# Patient Record
Sex: Female | Born: 1940
Health system: Southern US, Community
[De-identification: ages and names within clinical notes are randomized; demographics above are authoritative.]

## PROBLEM LIST (undated history)

## (undated) DIAGNOSIS — J45909 Unspecified asthma, uncomplicated: Secondary | ICD-10-CM

## (undated) DIAGNOSIS — I517 Cardiomegaly: Secondary | ICD-10-CM

## (undated) DIAGNOSIS — R269 Unspecified abnormalities of gait and mobility: Secondary | ICD-10-CM

## (undated) DIAGNOSIS — R748 Abnormal levels of other serum enzymes: Secondary | ICD-10-CM

## (undated) DIAGNOSIS — E669 Obesity, unspecified: Secondary | ICD-10-CM

## (undated) DIAGNOSIS — R0602 Shortness of breath: Secondary | ICD-10-CM

## (undated) DIAGNOSIS — R42 Dizziness and giddiness: Secondary | ICD-10-CM

## (undated) DIAGNOSIS — E039 Hypothyroidism, unspecified: Secondary | ICD-10-CM

## (undated) DIAGNOSIS — R945 Abnormal results of liver function studies: Secondary | ICD-10-CM

## (undated) DIAGNOSIS — K439 Ventral hernia without obstruction or gangrene: Secondary | ICD-10-CM

## (undated) DIAGNOSIS — I10 Essential (primary) hypertension: Secondary | ICD-10-CM

## (undated) DIAGNOSIS — E559 Vitamin D deficiency, unspecified: Secondary | ICD-10-CM

## (undated) DIAGNOSIS — G3184 Mild cognitive impairment, so stated: Secondary | ICD-10-CM

## (undated) DIAGNOSIS — T8859XA Other complications of anesthesia, initial encounter: Secondary | ICD-10-CM

## (undated) DIAGNOSIS — K224 Dyskinesia of esophagus: Secondary | ICD-10-CM

## (undated) DIAGNOSIS — R002 Palpitations: Secondary | ICD-10-CM

## (undated) DIAGNOSIS — M109 Gout, unspecified: Secondary | ICD-10-CM

## (undated) DIAGNOSIS — F3289 Other specified depressive episodes: Secondary | ICD-10-CM

## (undated) DIAGNOSIS — K2100 Gastro-esophageal reflux disease with esophagitis, without bleeding: Secondary | ICD-10-CM

## (undated) DIAGNOSIS — N393 Stress incontinence (female) (male): Secondary | ICD-10-CM

## (undated) DIAGNOSIS — R935 Abnormal findings on diagnostic imaging of other abdominal regions, including retroperitoneum: Secondary | ICD-10-CM

## (undated) DIAGNOSIS — IMO0002 Reserved for concepts with insufficient information to code with codable children: Secondary | ICD-10-CM

## (undated) DIAGNOSIS — R0989 Other specified symptoms and signs involving the circulatory and respiratory systems: Secondary | ICD-10-CM

## (undated) DIAGNOSIS — J441 Chronic obstructive pulmonary disease with (acute) exacerbation: Secondary | ICD-10-CM

## (undated) DIAGNOSIS — E785 Hyperlipidemia, unspecified: Secondary | ICD-10-CM

## (undated) DIAGNOSIS — E11319 Type 2 diabetes mellitus with unspecified diabetic retinopathy without macular edema: Secondary | ICD-10-CM

## (undated) DIAGNOSIS — L738 Other specified follicular disorders: Secondary | ICD-10-CM

## (undated) DIAGNOSIS — M25519 Pain in unspecified shoulder: Secondary | ICD-10-CM

## (undated) DIAGNOSIS — R0681 Apnea, not elsewhere classified: Secondary | ICD-10-CM

## (undated) DIAGNOSIS — T4145XA Adverse effect of unspecified anesthetic, initial encounter: Secondary | ICD-10-CM

## (undated) DIAGNOSIS — G44209 Tension-type headache, unspecified, not intractable: Secondary | ICD-10-CM

## (undated) DIAGNOSIS — R413 Other amnesia: Secondary | ICD-10-CM

## (undated) DIAGNOSIS — M545 Low back pain, unspecified: Secondary | ICD-10-CM

## (undated) DIAGNOSIS — G43011 Migraine without aura, intractable, with status migrainosus: Secondary | ICD-10-CM

## (undated) DIAGNOSIS — R079 Chest pain, unspecified: Secondary | ICD-10-CM

## (undated) DIAGNOSIS — M25559 Pain in unspecified hip: Secondary | ICD-10-CM

## (undated) DIAGNOSIS — E109 Type 1 diabetes mellitus without complications: Secondary | ICD-10-CM

## (undated) DIAGNOSIS — M25579 Pain in unspecified ankle and joints of unspecified foot: Secondary | ICD-10-CM

## (undated) DIAGNOSIS — R109 Unspecified abdominal pain: Secondary | ICD-10-CM

## (undated) DIAGNOSIS — I498 Other specified cardiac arrhythmias: Secondary | ICD-10-CM

## (undated) DIAGNOSIS — E1065 Type 1 diabetes mellitus with hyperglycemia: Secondary | ICD-10-CM

## (undated) DIAGNOSIS — R609 Edema, unspecified: Secondary | ICD-10-CM

## (undated) DIAGNOSIS — D1739 Benign lipomatous neoplasm of skin and subcutaneous tissue of other sites: Secondary | ICD-10-CM

## (undated) DIAGNOSIS — E538 Deficiency of other specified B group vitamins: Secondary | ICD-10-CM

## (undated) DIAGNOSIS — F329 Major depressive disorder, single episode, unspecified: Secondary | ICD-10-CM

## (undated) DIAGNOSIS — E7800 Pure hypercholesterolemia, unspecified: Secondary | ICD-10-CM

## (undated) HISTORY — DX: Pain in unspecified ankle and joints of unspecified foot: M25.579

## (undated) HISTORY — DX: Other specified cardiac arrhythmias: I49.8

## (undated) HISTORY — PX: BREAST SURGERY: SHX581

## (undated) HISTORY — DX: Vitamin D deficiency, unspecified: E55.9

## (undated) HISTORY — DX: Shortness of breath: R06.02

## (undated) HISTORY — PX: OTHER SURGICAL HISTORY: SHX169

## (undated) HISTORY — DX: Unspecified abnormalities of gait and mobility: R26.9

## (undated) HISTORY — DX: Cardiomegaly: I51.7

## (undated) HISTORY — DX: Chest pain, unspecified: R07.9

## (undated) HISTORY — DX: Essential (primary) hypertension: I10

## (undated) HISTORY — DX: Reserved for concepts with insufficient information to code with codable children: IMO0002

## (undated) HISTORY — DX: Chronic obstructive pulmonary disease with (acute) exacerbation: J44.1

## (undated) HISTORY — DX: Type 1 diabetes mellitus without complications: E10.9

## (undated) HISTORY — DX: Deficiency of other specified B group vitamins: E53.8

## (undated) HISTORY — DX: Dyskinesia of esophagus: K22.4

## (undated) HISTORY — PX: ABDOMINAL HYSTERECTOMY: SHX81

## (undated) HISTORY — DX: Other specified symptoms and signs involving the circulatory and respiratory systems: R09.89

## (undated) HISTORY — DX: Unspecified asthma, uncomplicated: J45.909

## (undated) HISTORY — DX: Other specified depressive episodes: F32.89

## (undated) HISTORY — DX: Major depressive disorder, single episode, unspecified: F32.9

## (undated) HISTORY — DX: Migraine without aura, intractable, with status migrainosus: G43.011

## (undated) HISTORY — DX: Abnormal levels of other serum enzymes: R74.8

## (undated) HISTORY — DX: Gout, unspecified: M10.9

## (undated) HISTORY — DX: Hyperlipidemia, unspecified: E78.5

## (undated) HISTORY — DX: Obesity, unspecified: E66.9

## (undated) HISTORY — DX: Other specified follicular disorders: L73.8

## (undated) HISTORY — DX: Unspecified abdominal pain: R10.9

## (undated) HISTORY — DX: Abnormal findings on diagnostic imaging of other abdominal regions, including retroperitoneum: R93.5

## (undated) HISTORY — DX: Pain in unspecified shoulder: M25.519

## (undated) HISTORY — DX: Low back pain: M54.5

## (undated) HISTORY — DX: Type 1 diabetes mellitus with hyperglycemia: E10.65

## (undated) HISTORY — DX: Gastro-esophageal reflux disease with esophagitis, without bleeding: K21.00

## (undated) HISTORY — DX: Other amnesia: R41.3

## (undated) HISTORY — DX: Dizziness and giddiness: R42

## (undated) HISTORY — DX: Mild cognitive impairment of uncertain or unknown etiology: G31.84

## (undated) HISTORY — DX: Benign lipomatous neoplasm of skin and subcutaneous tissue of other sites: D17.39

## (undated) HISTORY — DX: Hypothyroidism, unspecified: E03.9

## (undated) HISTORY — PX: APPENDECTOMY: SHX54

## (undated) HISTORY — DX: Gastro-esophageal reflux disease with esophagitis: K21.0

## (undated) HISTORY — DX: Low back pain, unspecified: M54.50

## (undated) HISTORY — DX: Pain in unspecified hip: M25.559

## (undated) HISTORY — DX: Ventral hernia without obstruction or gangrene: K43.9

## (undated) HISTORY — DX: Apnea, not elsewhere classified: R06.81

## (undated) HISTORY — DX: Abnormal results of liver function studies: R94.5

## (undated) HISTORY — DX: Palpitations: R00.2

## (undated) HISTORY — DX: Type 2 diabetes mellitus with unspecified diabetic retinopathy without macular edema: E11.319

## (undated) HISTORY — DX: Tension-type headache, unspecified, not intractable: G44.209

## (undated) HISTORY — DX: Edema, unspecified: R60.9

## (undated) HISTORY — DX: Stress incontinence (female) (male): N39.3

---

## 1998-04-02 ENCOUNTER — Ambulatory Visit: Admission: RE | Admit: 1998-04-02 | Discharge: 1998-04-02 | Payer: Self-pay | Admitting: *Deleted

## 1998-04-30 ENCOUNTER — Ambulatory Visit: Admission: RE | Admit: 1998-04-30 | Discharge: 1998-04-30 | Payer: Self-pay | Admitting: *Deleted

## 1999-03-12 ENCOUNTER — Encounter: Admission: RE | Admit: 1999-03-12 | Discharge: 1999-03-12 | Payer: Self-pay | Admitting: Family Medicine

## 1999-03-28 ENCOUNTER — Encounter: Admission: RE | Admit: 1999-03-28 | Discharge: 1999-03-28 | Payer: Self-pay | Admitting: Obstetrics and Gynecology

## 1999-03-28 ENCOUNTER — Encounter: Payer: Self-pay | Admitting: Obstetrics and Gynecology

## 2000-02-11 DIAGNOSIS — I517 Cardiomegaly: Secondary | ICD-10-CM | POA: Insufficient documentation

## 2000-05-06 DIAGNOSIS — K649 Unspecified hemorrhoids: Secondary | ICD-10-CM | POA: Insufficient documentation

## 2000-05-22 ENCOUNTER — Encounter: Payer: Self-pay | Admitting: Emergency Medicine

## 2000-05-22 ENCOUNTER — Encounter: Payer: Self-pay | Admitting: Orthopedic Surgery

## 2000-05-22 ENCOUNTER — Inpatient Hospital Stay (HOSPITAL_COMMUNITY): Admission: EM | Admit: 2000-05-22 | Discharge: 2000-05-26 | Payer: Self-pay | Admitting: Emergency Medicine

## 2000-05-23 ENCOUNTER — Encounter: Payer: Self-pay | Admitting: Orthopedic Surgery

## 2000-05-24 ENCOUNTER — Encounter (HOSPITAL_BASED_OUTPATIENT_CLINIC_OR_DEPARTMENT_OTHER): Payer: Self-pay | Admitting: Internal Medicine

## 2000-05-26 ENCOUNTER — Encounter: Payer: Self-pay | Admitting: Orthopedic Surgery

## 2000-12-08 ENCOUNTER — Encounter: Payer: Self-pay | Admitting: Endocrinology

## 2000-12-08 ENCOUNTER — Encounter: Admission: RE | Admit: 2000-12-08 | Discharge: 2000-12-08 | Payer: Self-pay | Admitting: Endocrinology

## 2001-08-09 ENCOUNTER — Emergency Department (HOSPITAL_COMMUNITY): Admission: EM | Admit: 2001-08-09 | Discharge: 2001-08-09 | Payer: Self-pay | Admitting: Emergency Medicine

## 2001-08-09 ENCOUNTER — Encounter: Payer: Self-pay | Admitting: Emergency Medicine

## 2001-09-12 ENCOUNTER — Ambulatory Visit (HOSPITAL_COMMUNITY): Admission: RE | Admit: 2001-09-12 | Discharge: 2001-09-12 | Payer: Self-pay | Admitting: Gastroenterology

## 2001-09-12 LAB — HM COLONOSCOPY

## 2001-11-22 ENCOUNTER — Other Ambulatory Visit: Admission: RE | Admit: 2001-11-22 | Discharge: 2001-11-22 | Payer: Self-pay | Admitting: Gynecology

## 2002-01-17 ENCOUNTER — Encounter: Admission: RE | Admit: 2002-01-17 | Discharge: 2002-01-17 | Payer: Self-pay | Admitting: Gynecology

## 2002-01-17 ENCOUNTER — Encounter: Payer: Self-pay | Admitting: Gynecology

## 2002-06-05 ENCOUNTER — Encounter (INDEPENDENT_AMBULATORY_CARE_PROVIDER_SITE_OTHER): Payer: Self-pay | Admitting: Cardiology

## 2002-06-05 ENCOUNTER — Ambulatory Visit (HOSPITAL_COMMUNITY): Admission: RE | Admit: 2002-06-05 | Discharge: 2002-06-05 | Payer: Self-pay | Admitting: Cardiology

## 2002-09-27 ENCOUNTER — Emergency Department (HOSPITAL_COMMUNITY): Admission: EM | Admit: 2002-09-27 | Discharge: 2002-09-27 | Payer: Self-pay | Admitting: Emergency Medicine

## 2002-09-27 ENCOUNTER — Encounter: Payer: Self-pay | Admitting: Emergency Medicine

## 2002-10-12 ENCOUNTER — Encounter: Payer: Self-pay | Admitting: Pulmonary Disease

## 2002-10-12 ENCOUNTER — Ambulatory Visit (HOSPITAL_COMMUNITY): Admission: RE | Admit: 2002-10-12 | Discharge: 2002-10-12 | Payer: Self-pay | Admitting: Pulmonary Disease

## 2003-03-30 ENCOUNTER — Encounter: Admission: RE | Admit: 2003-03-30 | Discharge: 2003-03-30 | Payer: Self-pay | Admitting: Gynecology

## 2003-10-21 ENCOUNTER — Inpatient Hospital Stay (HOSPITAL_COMMUNITY): Admission: EM | Admit: 2003-10-21 | Discharge: 2003-10-30 | Payer: Self-pay | Admitting: Emergency Medicine

## 2004-01-23 ENCOUNTER — Other Ambulatory Visit: Admission: RE | Admit: 2004-01-23 | Discharge: 2004-01-23 | Payer: Self-pay | Admitting: Gynecology

## 2004-01-30 ENCOUNTER — Encounter: Admission: RE | Admit: 2004-01-30 | Discharge: 2004-01-30 | Payer: Self-pay | Admitting: Otolaryngology

## 2004-03-31 ENCOUNTER — Encounter: Admission: RE | Admit: 2004-03-31 | Discharge: 2004-03-31 | Payer: Self-pay | Admitting: Gynecology

## 2004-04-14 DIAGNOSIS — J441 Chronic obstructive pulmonary disease with (acute) exacerbation: Secondary | ICD-10-CM | POA: Insufficient documentation

## 2004-05-29 ENCOUNTER — Encounter: Admission: RE | Admit: 2004-05-29 | Discharge: 2004-05-29 | Payer: Self-pay | Admitting: *Deleted

## 2004-07-08 ENCOUNTER — Encounter: Admission: RE | Admit: 2004-07-08 | Discharge: 2004-10-06 | Payer: Self-pay | Admitting: *Deleted

## 2004-07-22 ENCOUNTER — Inpatient Hospital Stay (HOSPITAL_COMMUNITY): Admission: RE | Admit: 2004-07-22 | Discharge: 2004-08-01 | Payer: Self-pay | Admitting: *Deleted

## 2004-07-22 HISTORY — PX: LAPAROSCOPIC GASTRIC BYPASS: SUR771

## 2004-07-25 ENCOUNTER — Ambulatory Visit: Payer: Self-pay | Admitting: *Deleted

## 2004-07-25 ENCOUNTER — Encounter (INDEPENDENT_AMBULATORY_CARE_PROVIDER_SITE_OTHER): Payer: Self-pay | Admitting: Specialist

## 2004-07-25 HISTORY — PX: EXPLORATORY LAPAROTOMY W/ BOWEL RESECTION: SHX1544

## 2004-08-04 ENCOUNTER — Encounter: Admission: RE | Admit: 2004-08-04 | Discharge: 2004-08-04 | Payer: Self-pay | Admitting: Cardiology

## 2004-08-06 ENCOUNTER — Emergency Department (HOSPITAL_COMMUNITY): Admission: EM | Admit: 2004-08-06 | Discharge: 2004-08-06 | Payer: Self-pay | Admitting: Emergency Medicine

## 2004-08-18 ENCOUNTER — Inpatient Hospital Stay (HOSPITAL_COMMUNITY): Admission: EM | Admit: 2004-08-18 | Discharge: 2004-08-20 | Payer: Self-pay | Admitting: Emergency Medicine

## 2004-10-18 ENCOUNTER — Emergency Department (HOSPITAL_COMMUNITY): Admission: EM | Admit: 2004-10-18 | Discharge: 2004-10-18 | Payer: Self-pay | Admitting: Emergency Medicine

## 2004-10-19 ENCOUNTER — Emergency Department (HOSPITAL_COMMUNITY): Admission: EM | Admit: 2004-10-19 | Discharge: 2004-10-19 | Payer: Self-pay | Admitting: Emergency Medicine

## 2004-10-20 DIAGNOSIS — R2681 Unsteadiness on feet: Secondary | ICD-10-CM | POA: Insufficient documentation

## 2004-10-20 DIAGNOSIS — R079 Chest pain, unspecified: Secondary | ICD-10-CM | POA: Insufficient documentation

## 2004-10-22 ENCOUNTER — Encounter: Admission: RE | Admit: 2004-10-22 | Discharge: 2005-01-20 | Payer: Self-pay | Admitting: *Deleted

## 2005-01-21 ENCOUNTER — Encounter: Admission: RE | Admit: 2005-01-21 | Discharge: 2005-02-23 | Payer: Self-pay | Admitting: *Deleted

## 2005-01-29 ENCOUNTER — Encounter: Admission: RE | Admit: 2005-01-29 | Discharge: 2005-01-29 | Payer: Self-pay | Admitting: *Deleted

## 2005-02-13 ENCOUNTER — Ambulatory Visit (HOSPITAL_COMMUNITY): Admission: RE | Admit: 2005-02-13 | Discharge: 2005-02-13 | Payer: Self-pay | Admitting: *Deleted

## 2005-04-02 ENCOUNTER — Encounter: Admission: RE | Admit: 2005-04-02 | Discharge: 2005-04-02 | Payer: Self-pay | Admitting: Gynecology

## 2005-07-23 ENCOUNTER — Ambulatory Visit: Payer: Self-pay | Admitting: Pulmonary Disease

## 2005-09-22 HISTORY — PX: LAPAROSCOPIC INCISIONAL / UMBILICAL / VENTRAL HERNIA REPAIR: SUR789

## 2005-09-23 ENCOUNTER — Inpatient Hospital Stay (HOSPITAL_COMMUNITY): Admission: RE | Admit: 2005-09-23 | Discharge: 2005-09-25 | Payer: Self-pay | Admitting: *Deleted

## 2005-12-29 DIAGNOSIS — R109 Unspecified abdominal pain: Secondary | ICD-10-CM | POA: Insufficient documentation

## 2006-01-28 ENCOUNTER — Other Ambulatory Visit: Admission: RE | Admit: 2006-01-28 | Discharge: 2006-01-28 | Payer: Self-pay | Admitting: Gynecology

## 2006-02-02 ENCOUNTER — Ambulatory Visit: Admission: RE | Admit: 2006-02-02 | Discharge: 2006-02-02 | Payer: Self-pay | Admitting: *Deleted

## 2006-03-19 ENCOUNTER — Inpatient Hospital Stay (HOSPITAL_COMMUNITY): Admission: RE | Admit: 2006-03-19 | Discharge: 2006-03-24 | Payer: Self-pay | Admitting: *Deleted

## 2006-03-19 ENCOUNTER — Ambulatory Visit: Payer: Self-pay | Admitting: Emergency Medicine

## 2006-03-19 HISTORY — PX: INCISIONAL HERNIA REPAIR: SHX193

## 2006-04-05 ENCOUNTER — Encounter: Admission: RE | Admit: 2006-04-05 | Discharge: 2006-04-05 | Payer: Self-pay | Admitting: Gynecology

## 2006-07-15 ENCOUNTER — Emergency Department (HOSPITAL_COMMUNITY): Admission: EM | Admit: 2006-07-15 | Discharge: 2006-07-16 | Payer: Self-pay | Admitting: Emergency Medicine

## 2007-03-22 ENCOUNTER — Inpatient Hospital Stay (HOSPITAL_COMMUNITY): Admission: EM | Admit: 2007-03-22 | Discharge: 2007-03-23 | Payer: Self-pay | Admitting: Emergency Medicine

## 2007-04-08 ENCOUNTER — Encounter: Admission: RE | Admit: 2007-04-08 | Discharge: 2007-04-08 | Payer: Self-pay | Admitting: Gynecology

## 2007-04-12 DIAGNOSIS — K219 Gastro-esophageal reflux disease without esophagitis: Secondary | ICD-10-CM | POA: Insufficient documentation

## 2007-11-25 DIAGNOSIS — R609 Edema, unspecified: Secondary | ICD-10-CM | POA: Insufficient documentation

## 2008-04-27 ENCOUNTER — Emergency Department (HOSPITAL_COMMUNITY): Admission: EM | Admit: 2008-04-27 | Discharge: 2008-04-28 | Payer: Self-pay | Admitting: Emergency Medicine

## 2008-05-17 ENCOUNTER — Encounter: Payer: Self-pay | Admitting: Pulmonary Disease

## 2008-05-17 ENCOUNTER — Emergency Department (HOSPITAL_COMMUNITY): Admission: EM | Admit: 2008-05-17 | Discharge: 2008-05-17 | Payer: Self-pay | Admitting: Internal Medicine

## 2008-05-22 ENCOUNTER — Ambulatory Visit: Payer: Self-pay | Admitting: Internal Medicine

## 2008-06-18 ENCOUNTER — Ambulatory Visit: Payer: Self-pay | Admitting: Pulmonary Disease

## 2008-06-18 DIAGNOSIS — R911 Solitary pulmonary nodule: Secondary | ICD-10-CM | POA: Insufficient documentation

## 2008-06-18 DIAGNOSIS — R05 Cough: Secondary | ICD-10-CM

## 2008-06-18 DIAGNOSIS — R599 Enlarged lymph nodes, unspecified: Secondary | ICD-10-CM | POA: Insufficient documentation

## 2008-07-25 DIAGNOSIS — R748 Abnormal levels of other serum enzymes: Secondary | ICD-10-CM | POA: Insufficient documentation

## 2008-08-20 ENCOUNTER — Telehealth (INDEPENDENT_AMBULATORY_CARE_PROVIDER_SITE_OTHER): Payer: Self-pay | Admitting: *Deleted

## 2008-09-11 ENCOUNTER — Ambulatory Visit: Payer: Self-pay | Admitting: Adult Health

## 2008-09-11 DIAGNOSIS — J209 Acute bronchitis, unspecified: Secondary | ICD-10-CM

## 2008-09-14 ENCOUNTER — Telehealth (INDEPENDENT_AMBULATORY_CARE_PROVIDER_SITE_OTHER): Payer: Self-pay | Admitting: *Deleted

## 2008-09-25 ENCOUNTER — Ambulatory Visit: Payer: Self-pay | Admitting: Pulmonary Disease

## 2008-09-27 ENCOUNTER — Inpatient Hospital Stay (HOSPITAL_COMMUNITY): Admission: AD | Admit: 2008-09-27 | Discharge: 2008-09-28 | Payer: Self-pay | Admitting: Pulmonary Disease

## 2008-09-27 ENCOUNTER — Ambulatory Visit: Payer: Self-pay | Admitting: Pulmonary Disease

## 2008-09-27 ENCOUNTER — Ambulatory Visit: Payer: Self-pay | Admitting: Internal Medicine

## 2008-10-03 ENCOUNTER — Ambulatory Visit: Payer: Self-pay | Admitting: Internal Medicine

## 2008-10-24 DIAGNOSIS — G3184 Mild cognitive impairment, so stated: Secondary | ICD-10-CM | POA: Insufficient documentation

## 2008-11-02 ENCOUNTER — Ambulatory Visit: Payer: Self-pay | Admitting: Pulmonary Disease

## 2008-11-02 ENCOUNTER — Telehealth (INDEPENDENT_AMBULATORY_CARE_PROVIDER_SITE_OTHER): Payer: Self-pay | Admitting: *Deleted

## 2008-12-21 ENCOUNTER — Encounter: Admission: RE | Admit: 2008-12-21 | Discharge: 2008-12-21 | Payer: Self-pay | Admitting: Internal Medicine

## 2009-01-01 ENCOUNTER — Ambulatory Visit: Payer: Self-pay | Admitting: Pulmonary Disease

## 2009-01-01 ENCOUNTER — Telehealth (INDEPENDENT_AMBULATORY_CARE_PROVIDER_SITE_OTHER): Payer: Self-pay | Admitting: *Deleted

## 2009-01-12 ENCOUNTER — Ambulatory Visit: Payer: Self-pay | Admitting: Internal Medicine

## 2009-01-12 ENCOUNTER — Inpatient Hospital Stay (HOSPITAL_COMMUNITY): Admission: EM | Admit: 2009-01-12 | Discharge: 2009-01-16 | Payer: Self-pay | Admitting: Emergency Medicine

## 2009-01-14 ENCOUNTER — Encounter (INDEPENDENT_AMBULATORY_CARE_PROVIDER_SITE_OTHER): Payer: Self-pay | Admitting: *Deleted

## 2009-01-22 ENCOUNTER — Ambulatory Visit (HOSPITAL_COMMUNITY): Admission: RE | Admit: 2009-01-22 | Discharge: 2009-01-22 | Payer: Self-pay | Admitting: Gynecology

## 2009-01-23 ENCOUNTER — Inpatient Hospital Stay (HOSPITAL_COMMUNITY): Admission: EM | Admit: 2009-01-23 | Discharge: 2009-01-26 | Payer: Self-pay | Admitting: Emergency Medicine

## 2009-02-13 DIAGNOSIS — E559 Vitamin D deficiency, unspecified: Secondary | ICD-10-CM | POA: Insufficient documentation

## 2009-02-13 DIAGNOSIS — IMO0002 Reserved for concepts with insufficient information to code with codable children: Secondary | ICD-10-CM | POA: Insufficient documentation

## 2009-02-21 ENCOUNTER — Encounter: Admission: RE | Admit: 2009-02-21 | Discharge: 2009-02-21 | Payer: Self-pay | Admitting: Surgery

## 2009-03-11 DIAGNOSIS — G43011 Migraine without aura, intractable, with status migrainosus: Secondary | ICD-10-CM | POA: Insufficient documentation

## 2009-06-20 DIAGNOSIS — R932 Abnormal findings on diagnostic imaging of liver and biliary tract: Secondary | ICD-10-CM | POA: Insufficient documentation

## 2009-09-25 ENCOUNTER — Ambulatory Visit (HOSPITAL_COMMUNITY): Admission: RE | Admit: 2009-09-25 | Discharge: 2009-09-25 | Payer: Self-pay | Admitting: Orthopedic Surgery

## 2009-12-26 ENCOUNTER — Encounter: Admission: RE | Admit: 2009-12-26 | Discharge: 2009-12-26 | Payer: Self-pay | Admitting: Gynecology

## 2010-05-13 DIAGNOSIS — L853 Xerosis cutis: Secondary | ICD-10-CM | POA: Insufficient documentation

## 2010-06-22 ENCOUNTER — Encounter: Payer: Self-pay | Admitting: Internal Medicine

## 2010-07-02 ENCOUNTER — Encounter: Payer: Self-pay | Admitting: Gastroenterology

## 2010-08-19 LAB — BASIC METABOLIC PANEL
BUN: 16 mg/dL (ref 6–23)
CO2: 29 mEq/L (ref 19–32)
Calcium: 9.6 mg/dL (ref 8.4–10.5)
Chloride: 104 mEq/L (ref 96–112)
Creatinine, Ser: 0.76 mg/dL (ref 0.4–1.2)
GFR calc Af Amer: >60 mL/min (ref >60)
GFR calc non Af Amer: >60 mL/min (ref >60)
Glucose, Bld: 93 mg/dL (ref 70–99)
Potassium: 4.3 mEq/L (ref 3.5–5.1)
Sodium: 141 mEq/L (ref 135–145)

## 2010-08-19 LAB — CBC
MCHC: 34.4 g/dL (ref 30.0–36.0)
MCV: 88.9 fL (ref 78.0–100.0)
Platelets: 247 10*3/uL (ref 150–400)
WBC: 7.6 10*3/uL (ref 4.0–10.5)

## 2010-08-19 LAB — GLUCOSE, CAPILLARY: Glucose-Capillary: 159 mg/dL — ABNORMAL HIGH (ref 70–99)

## 2010-09-06 LAB — CBC
HCT: 38.4 % (ref 36.0–46.0)
Hemoglobin: 14.1 g/dL (ref 12.0–15.0)
MCHC: 33.2 g/dL (ref 30.0–36.0)
MCHC: 33.4 g/dL (ref 30.0–36.0)
MCHC: 32.6 g/dL (ref 30.0–36.0)
MCV: 89.3 fL (ref 78.0–100.0)
Platelets: 252 10*3/uL (ref 150–400)
Platelets: 259 10*3/uL (ref 150–400)
Platelets: 261 10*3/uL (ref 150–400)
Platelets: 283 10*3/uL (ref 150–400)
RBC: 4.1 MIL/uL (ref 3.87–5.11)
RBC: 4.27 MIL/uL (ref 3.87–5.11)
RBC: 4.3 MIL/uL (ref 3.87–5.11)
RDW: 14.4 % (ref 11.5–15.5)
RDW: 14 % (ref 11.5–15.5)
RDW: 14.3 % (ref 11.5–15.5)
RDW: 14.1 % (ref 11.5–15.5)
WBC: 10 10*3/uL (ref 4.0–10.5)
WBC: 5.9 10*3/uL (ref 4.0–10.5)
WBC: 6.6 10*3/uL (ref 4.0–10.5)

## 2010-09-06 LAB — COMPREHENSIVE METABOLIC PANEL
ALT: 15 U/L (ref 0–35)
AST: 18 U/L (ref 0–37)
AST: 24 U/L (ref 0–37)
Albumin: 2.6 g/dL — ABNORMAL LOW (ref 3.5–5.2)
Albumin: 3.1 g/dL — ABNORMAL LOW (ref 3.5–5.2)
Alkaline Phosphatase: 65 U/L (ref 39–117)
Calcium: 7 mg/dL — ABNORMAL LOW (ref 8.4–10.5)
Chloride: 103 mEq/L (ref 96–112)
Creatinine, Ser: 0.88 mg/dL (ref 0.4–1.2)
GFR calc Af Amer: >60 mL/min (ref >60)
Potassium: 3.5 mEq/L (ref 3.5–5.1)
Sodium: 136 mEq/L (ref 135–145)
Total Protein: 5.3 g/dL — ABNORMAL LOW (ref 6.0–8.3)

## 2010-09-06 LAB — BASIC METABOLIC PANEL
BUN: 18 mg/dL (ref 6–23)
BUN: 17 mg/dL (ref 6–23)
CO2: 29 mEq/L (ref 19–32)
Calcium: 7 mg/dL — ABNORMAL LOW (ref 8.4–10.5)
Calcium: 8.6 mg/dL (ref 8.4–10.5)
Chloride: 103 mEq/L (ref 96–112)
Creatinine, Ser: 0.79 mg/dL (ref 0.4–1.2)
Creatinine, Ser: 0.64 mg/dL (ref 0.4–1.2)
GFR calc Af Amer: >60 mL/min (ref >60)
GFR calc Af Amer: >60 mL/min (ref >60)
GFR calc non Af Amer: >60 mL/min (ref >60)
GFR calc non Af Amer: >60 mL/min (ref >60)
Glucose, Bld: 92 mg/dL (ref 70–99)
Potassium: 3.7 mEq/L (ref 3.5–5.1)
Sodium: 137 mEq/L (ref 135–145)

## 2010-09-06 LAB — POCT CARDIAC MARKERS: CKMB, poc: <1 ng/mL — ABNORMAL LOW (ref 1.0–8.0)

## 2010-09-06 LAB — GLUCOSE, CAPILLARY
Glucose-Capillary: 125 mg/dL — ABNORMAL HIGH (ref 70–99)
Glucose-Capillary: 180 mg/dL — ABNORMAL HIGH (ref 70–99)
Glucose-Capillary: 206 mg/dL — ABNORMAL HIGH (ref 70–99)
Glucose-Capillary: 211 mg/dL — ABNORMAL HIGH (ref 70–99)
Glucose-Capillary: 232 mg/dL — ABNORMAL HIGH (ref 70–99)
Glucose-Capillary: 253 mg/dL — ABNORMAL HIGH (ref 70–99)
Glucose-Capillary: 272 mg/dL — ABNORMAL HIGH (ref 70–99)
Glucose-Capillary: 274 mg/dL — ABNORMAL HIGH (ref 70–99)
Glucose-Capillary: 275 mg/dL — ABNORMAL HIGH (ref 70–99)
Glucose-Capillary: 51 mg/dL — ABNORMAL LOW (ref 70–99)
Glucose-Capillary: 106 mg/dL — ABNORMAL HIGH (ref 70–99)
Glucose-Capillary: 132 mg/dL — ABNORMAL HIGH (ref 70–99)
Glucose-Capillary: 134 mg/dL — ABNORMAL HIGH (ref 70–99)
Glucose-Capillary: 145 mg/dL — ABNORMAL HIGH (ref 70–99)
Glucose-Capillary: 151 mg/dL — ABNORMAL HIGH (ref 70–99)
Glucose-Capillary: 202 mg/dL — ABNORMAL HIGH (ref 70–99)
Glucose-Capillary: 300 mg/dL — ABNORMAL HIGH (ref 70–99)
Glucose-Capillary: 57 mg/dL — ABNORMAL LOW (ref 70–99)
Glucose-Capillary: 94 mg/dL (ref 70–99)
Glucose-Capillary: 126 mg/dL — ABNORMAL HIGH (ref 70–99)
Glucose-Capillary: 152 mg/dL — ABNORMAL HIGH (ref 70–99)
Glucose-Capillary: 180 mg/dL — ABNORMAL HIGH (ref 70–99)

## 2010-09-06 LAB — CARDIAC PANEL(CRET KIN+CKTOT+MB+TROPI)
CK, MB: 0.9 ng/mL (ref 0.3–4.0)
Relative Index: INVALID (ref 0.0–2.5)
Relative Index: INVALID (ref 0.0–2.5)
Total CK: 39 U/L (ref 7–177)
Total CK: 37 U/L (ref 7–177)
Total CK: 43 U/L (ref 7–177)
Troponin I: 0.01 ng/mL (ref 0.00–0.06)
Troponin I: 0.01 ng/mL (ref 0.00–0.06)

## 2010-09-06 LAB — URINE MICROSCOPIC-ADD ON

## 2010-09-06 LAB — DIFFERENTIAL
Basophils Relative: 0 % (ref 0–1)
Eosinophils Absolute: 0 10*3/uL (ref 0.0–0.7)
Eosinophils Relative: 0 % (ref 0–5)
Eosinophils Relative: 3 % (ref 0–5)
Lymphocytes Relative: 33 % (ref 12–46)
Lymphs Abs: 2.2 10*3/uL (ref 0.7–4.0)
Monocytes Absolute: 0.2 10*3/uL (ref 0.1–1.0)
Monocytes Relative: 2 % — ABNORMAL LOW (ref 3–12)
Monocytes Relative: 5 % (ref 3–12)

## 2010-09-06 LAB — URINALYSIS, ROUTINE W REFLEX MICROSCOPIC
Glucose, UA: NEGATIVE mg/dL
Hgb urine dipstick: NEGATIVE
Hgb urine dipstick: NEGATIVE
Ketones, ur: NEGATIVE mg/dL
Specific Gravity, Urine: 1.023 (ref 1.005–1.030)
Urobilinogen, UA: 1 mg/dL (ref 0.0–1.0)
pH: 7 (ref 5.0–8.0)

## 2010-09-06 LAB — PROTIME-INR
INR: 1.2 (ref 0.00–1.49)
INR: 1 (ref 0.00–1.49)

## 2010-09-06 LAB — POCT I-STAT, CHEM 8
Calcium, Ion: 0.96 mmol/L — ABNORMAL LOW (ref 1.12–1.32)
Creatinine, Ser: 1 mg/dL (ref 0.4–1.2)
Glucose, Bld: 243 mg/dL — ABNORMAL HIGH (ref 70–99)
Hemoglobin: 15 g/dL (ref 12.0–15.0)
Potassium: 3.3 mEq/L — ABNORMAL LOW (ref 3.5–5.1)
TCO2: 25 mmol/L (ref 0–100)

## 2010-09-06 LAB — APTT
aPTT: 23 seconds — ABNORMAL LOW (ref 24–37)
aPTT: 22 seconds — ABNORMAL LOW (ref 24–37)

## 2010-09-06 LAB — POCT I-STAT 3, VENOUS BLOOD GAS (G3P V)
Acid-base deficit: 1 mmol/L (ref 0.0–2.0)
Bicarbonate: 23.9 mEq/L (ref 20.0–24.0)
O2 Saturation: 80 %
pO2, Ven: 45 mmHg (ref 30.0–45.0)

## 2010-09-06 LAB — LIPASE, BLOOD: Lipase: 14 U/L (ref 11–59)

## 2010-09-06 LAB — URINE CULTURE
Colony Count: 4000
Special Requests: NEGATIVE

## 2010-09-06 LAB — POCT I-STAT 3, ART BLOOD GAS (G3+)
Acid-base deficit: 2 mmol/L (ref 0.0–2.0)
O2 Saturation: 98 %
pCO2 arterial: 37.1 mmHg (ref 35.0–45.0)

## 2010-09-06 LAB — CK TOTAL AND CKMB (NOT AT ARMC): CK, MB: 0.6 ng/mL (ref 0.3–4.0)

## 2010-09-06 LAB — GLYCOHEMOGLOBIN, TOTAL: Hemoglobin-A1c: 10.1 % — ABNORMAL HIGH (ref 5.4–7.4)

## 2010-09-06 LAB — SEDIMENTATION RATE: Sed Rate: 5 mm/hr (ref 0–22)

## 2010-09-06 LAB — CULTURE, BLOOD (ROUTINE X 2): Culture: NO GROWTH

## 2010-09-10 LAB — GLUCOSE, CAPILLARY
Glucose-Capillary: 120 mg/dL — ABNORMAL HIGH (ref 70–99)
Glucose-Capillary: 139 mg/dL — ABNORMAL HIGH (ref 70–99)
Glucose-Capillary: 147 mg/dL — ABNORMAL HIGH (ref 70–99)
Glucose-Capillary: 151 mg/dL — ABNORMAL HIGH (ref 70–99)
Glucose-Capillary: 177 mg/dL — ABNORMAL HIGH (ref 70–99)
Glucose-Capillary: 276 mg/dL — ABNORMAL HIGH (ref 70–99)
Glucose-Capillary: 346 mg/dL — ABNORMAL HIGH (ref 70–99)
Glucose-Capillary: 385 mg/dL — ABNORMAL HIGH (ref 70–99)
Glucose-Capillary: 395 mg/dL — ABNORMAL HIGH (ref 70–99)

## 2010-09-10 LAB — BASIC METABOLIC PANEL
BUN: 20 mg/dL (ref 6–23)
BUN: 20 mg/dL (ref 6–23)
CO2: 31 mEq/L (ref 19–32)
Chloride: 102 mEq/L (ref 96–112)
Chloride: 103 mEq/L (ref 96–112)
Creatinine, Ser: 0.71 mg/dL (ref 0.4–1.2)
Glucose, Bld: 133 mg/dL — ABNORMAL HIGH (ref 70–99)
Potassium: 3.6 mEq/L (ref 3.5–5.1)
Potassium: 4.9 mEq/L (ref 3.5–5.1)

## 2010-09-10 LAB — MAGNESIUM: Magnesium: 2 mg/dL (ref 1.5–2.5)

## 2010-09-10 LAB — CBC
HCT: 39.6 % (ref 36.0–46.0)
HCT: 39.9 % (ref 36.0–46.0)
Hemoglobin: 13.4 g/dL (ref 12.0–15.0)
MCHC: 33.8 g/dL (ref 30.0–36.0)
MCV: 89.5 fL (ref 78.0–100.0)
MCV: 89.6 fL (ref 78.0–100.0)
Platelets: 272 10*3/uL (ref 150–400)
Platelets: 279 10*3/uL (ref 150–400)
WBC: 10.1 10*3/uL (ref 4.0–10.5)
WBC: 8 10*3/uL (ref 4.0–10.5)

## 2010-09-10 LAB — CARDIAC PANEL(CRET KIN+CKTOT+MB+TROPI)
CK, MB: 1.1 ng/mL (ref 0.3–4.0)
Troponin I: <0.01 ng/mL (ref 0.00–0.06)

## 2010-09-10 LAB — PROTIME-INR: Prothrombin Time: 13.5 seconds (ref 11.6–15.2)

## 2010-09-10 LAB — BRAIN NATRIURETIC PEPTIDE: Pro B Natriuretic peptide (BNP): 119 pg/mL — ABNORMAL HIGH (ref 0.0–100.0)

## 2010-09-30 DIAGNOSIS — K439 Ventral hernia without obstruction or gangrene: Secondary | ICD-10-CM

## 2010-09-30 DIAGNOSIS — J329 Chronic sinusitis, unspecified: Secondary | ICD-10-CM

## 2010-09-30 DIAGNOSIS — E1065 Type 1 diabetes mellitus with hyperglycemia: Secondary | ICD-10-CM

## 2010-09-30 DIAGNOSIS — R079 Chest pain, unspecified: Secondary | ICD-10-CM

## 2010-09-30 DIAGNOSIS — M25559 Pain in unspecified hip: Secondary | ICD-10-CM

## 2010-09-30 DIAGNOSIS — R42 Dizziness and giddiness: Secondary | ICD-10-CM

## 2010-09-30 DIAGNOSIS — M109 Gout, unspecified: Secondary | ICD-10-CM

## 2010-09-30 DIAGNOSIS — E559 Vitamin D deficiency, unspecified: Secondary | ICD-10-CM

## 2010-09-30 DIAGNOSIS — J45909 Unspecified asthma, uncomplicated: Secondary | ICD-10-CM

## 2010-09-30 DIAGNOSIS — N393 Stress incontinence (female) (male): Secondary | ICD-10-CM

## 2010-09-30 DIAGNOSIS — G44209 Tension-type headache, unspecified, not intractable: Secondary | ICD-10-CM

## 2010-09-30 DIAGNOSIS — E538 Deficiency of other specified B group vitamins: Secondary | ICD-10-CM

## 2010-09-30 DIAGNOSIS — I517 Cardiomegaly: Secondary | ICD-10-CM

## 2010-09-30 DIAGNOSIS — E109 Type 1 diabetes mellitus without complications: Secondary | ICD-10-CM

## 2010-09-30 DIAGNOSIS — M25519 Pain in unspecified shoulder: Secondary | ICD-10-CM

## 2010-09-30 DIAGNOSIS — J441 Chronic obstructive pulmonary disease with (acute) exacerbation: Secondary | ICD-10-CM

## 2010-09-30 DIAGNOSIS — D1739 Benign lipomatous neoplasm of skin and subcutaneous tissue of other sites: Secondary | ICD-10-CM

## 2010-09-30 DIAGNOSIS — M545 Low back pain, unspecified: Secondary | ICD-10-CM

## 2010-09-30 DIAGNOSIS — R932 Abnormal findings on diagnostic imaging of liver and biliary tract: Secondary | ICD-10-CM

## 2010-09-30 DIAGNOSIS — R002 Palpitations: Secondary | ICD-10-CM

## 2010-09-30 DIAGNOSIS — Z79899 Other long term (current) drug therapy: Secondary | ICD-10-CM

## 2010-09-30 DIAGNOSIS — F329 Major depressive disorder, single episode, unspecified: Secondary | ICD-10-CM

## 2010-09-30 DIAGNOSIS — M791 Myalgia, unspecified site: Secondary | ICD-10-CM

## 2010-09-30 DIAGNOSIS — M25579 Pain in unspecified ankle and joints of unspecified foot: Secondary | ICD-10-CM

## 2010-09-30 DIAGNOSIS — R413 Other amnesia: Secondary | ICD-10-CM

## 2010-09-30 DIAGNOSIS — R935 Abnormal findings on diagnostic imaging of other abdominal regions, including retroperitoneum: Secondary | ICD-10-CM

## 2010-09-30 DIAGNOSIS — R0989 Other specified symptoms and signs involving the circulatory and respiratory systems: Secondary | ICD-10-CM

## 2010-09-30 DIAGNOSIS — R001 Bradycardia, unspecified: Secondary | ICD-10-CM

## 2010-09-30 DIAGNOSIS — IMO0002 Reserved for concepts with insufficient information to code with codable children: Secondary | ICD-10-CM

## 2010-09-30 DIAGNOSIS — F3289 Other specified depressive episodes: Secondary | ICD-10-CM

## 2010-09-30 DIAGNOSIS — G3184 Mild cognitive impairment, so stated: Secondary | ICD-10-CM

## 2010-09-30 DIAGNOSIS — R748 Abnormal levels of other serum enzymes: Secondary | ICD-10-CM

## 2010-09-30 DIAGNOSIS — H6690 Otitis media, unspecified, unspecified ear: Secondary | ICD-10-CM

## 2010-09-30 DIAGNOSIS — R0681 Apnea, not elsewhere classified: Secondary | ICD-10-CM

## 2010-09-30 DIAGNOSIS — G43011 Migraine without aura, intractable, with status migrainosus: Secondary | ICD-10-CM

## 2010-09-30 DIAGNOSIS — E039 Hypothyroidism, unspecified: Secondary | ICD-10-CM

## 2010-09-30 DIAGNOSIS — R269 Unspecified abnormalities of gait and mobility: Secondary | ICD-10-CM

## 2010-09-30 DIAGNOSIS — R609 Edema, unspecified: Secondary | ICD-10-CM

## 2010-09-30 DIAGNOSIS — R109 Unspecified abdominal pain: Secondary | ICD-10-CM

## 2010-09-30 DIAGNOSIS — I10 Essential (primary) hypertension: Secondary | ICD-10-CM

## 2010-09-30 DIAGNOSIS — L853 Xerosis cutis: Secondary | ICD-10-CM

## 2010-09-30 DIAGNOSIS — E785 Hyperlipidemia, unspecified: Secondary | ICD-10-CM

## 2010-09-30 DIAGNOSIS — K224 Dyskinesia of esophagus: Secondary | ICD-10-CM

## 2010-10-14 NOTE — H&P (Signed)
NAMERONNITA, PAZ NO.:  [id number]   MEDICAL RECORD NO.:  [id number]          PATIENT TYPE:  INP   LOCATION:  0110                         FACILITY:  [facility]   PHYSICIAN:  Wendi Snipes, MD DATE OF BIRTH:  1940/08/19   DATE OF ADMISSION:  01/12/2009  DATE OF DISCHARGE:                              HISTORY & PHYSICAL   PRIMARY CARDIOLOGIST:  Dr. Donnie Aho.   PRIMARY DOCTOR:  Dr. Frederik Pear.   CHIEF COMPLAINT:  Chest pain.   HISTORY OF PRESENT ILLNESS:  This is a 70 year old white female with a  history of mild nonobstructive coronary artery disease, ventral hernia  here with severe midsternal chest pain.  She states the symptoms have  been occurring occasionally over the last 2 weeks; however, not as  severe as this afternoon's pain.  She states also during this time this  pain has been associated with vomiting undigested food and feeling weak  during these episodes.  Today, she experienced this while at rest and  describes it as very severe, though it resolved spontaneously she was  evaluated in the emergency department.  The pain lasted approximately 30  minutes and was not associated with diaphoresis, syncope, presyncope,  palpitations nor has she recently had paroxysmal nocturnal dyspnea,  orthopnea, increased lower extremity edema or exertional angina.   PAST MEDICAL HISTORY:  1. Mild coronary artery disease status post cardiac cath in 2008 which      showed a proximal LAD calcified lesion that was less than 30%      obstruction with an EF of 60% to 65%.  2. Hypertension.  3. Diabetes, insulin dependent.  4. Vocal cord dysfunction.  5. Hiatal hernia.  6. Hyperlipidemia.   ALLERGIES:  1. CODEINE.  2. MORPHINE.  3. BENICAR.  4. NSAIDS.  5. ANTIHISTAMINES.   MEDICATIONS ON ADMISSION:  1. Glipizide 10 mg twice daily.  2. Lipitor 20 mg nightly.  3. Celexa 40 mg daily.  4. Aspirin 81 mg daily.  5. Sliding scale insulin.  6. Protonix 40 mg  twice daily.  7. Avapro 40 mg twice daily.  8. Flonase 2 sprays daily.  9. Humulin insulin 21 units subcu daily.   SOCIAL HISTORY:  [place]  She is currently retired.  She has never smoked.   FAMILY HISTORY:  Her mother had a myocardial infarction in her 37s and  her father had a myocardial infarction in his 67s.  Otherwise, no early  coronary artery disease.   REVIEW OF SYSTEMS:  All 14 systems were reviewed and were negative  except as mentioned in detail in the HPI.   PHYSICAL EXAM:  Blood pressure is 113/39, her respiratory rate is 16.  Her pulse is 78.  She is saturating 98% on in room air.  GENERAL:  She is a 70 year old white female appearing stated age, in no  acute distress.  HEENT:  Moist mucous membranes.  Pupils equal and round, react to light  and accommodation.  Anicteric sclera.  NECK:  No jugular venous distention.  No thyromegaly.  CARDIOVASCULAR:  Regular rate and rhythm.  No murmurs, rubs or gallops.  LUNGS:  Clear to auscultation bilaterally.  ABDOMEN:  Nontender, nondistended.  Positive bowel sounds.  No masses.  EXTREMITIES:  No clubbing, cyanosis or edema, 2+ pulses throughout.  NEUROLOGIC:  Alert and oriented x3.  Cranial nerves II-XII grossly  intact.  No focal neurologic deficits.  SKIN:  Warm, dry and intact.  No rashes.  PSYCH:  Mood and affect are appropriate.   RADIOLOGY:  A CT angiogram of the chest showed no acute cardiopulmonary  process and was negative for pulmonary embolism or aortic dissection.  Electrocardiogram showed sinus bradycardia at a rate of 57 beats per  minute with no ST or T-wave abnormalities suggestive of ongoing  ischemia.   LABORATORY:  White blood cell count 6.6, her hematocrit was 38.4, her  platelet count is 283, her potassium is 3.7, her BUN is 17, her  creatinine is 0.7, blood sugar is 132.  UA showed many bacteria and  positive for nitrites.   ASSESSMENT/PLAN:   Assessment:  This is a 70 year old white female with a  history of nonobstructive coronary disease and hypertension,  hyperlipidemia here with atypical chest pain.  1. Atypical chest pain.  This is concerning for unstable angina,      though her symptoms are very atypical and there is no current      objective evidence of ischemia.  This most likely represents a      gastric or esophageal process that has worsened over the last 2      weeks.  However, in context of her risk factors we will admit her      to the hospital for a set of cardiac enzymes.  We will continue a      heparin drip for anticoagulation until she rules out.  Will      continue her other risk factor modification medications at this      time.  2. Urinary tract infection.  Will check a urine culture and start her      on twice a day Bactrim for a 3-day treatment for noncomplicated      urinary tract infection.      Wendi Snipes, MD  Electronically Signed     BHH/MEDQ  D:  01/12/2009  T:  01/12/2009  Job:  [id number]

## 2010-10-14 NOTE — H&P (Signed)
NAMEEVERETT, RICCIARDELLI NO.:  [id number]   MEDICAL RECORD NO.:  [id number]          PATIENT TYPE:  INP   LOCATION:  2010                         FACILITY:  [facility]   PHYSICIAN:  Georga Hacking, M.D.DATE OF BIRTH:  07-05-1940   DATE OF ADMISSION:  03/22/2007  DATE OF DISCHARGE:                              HISTORY & PHYSICAL   REASON FOR ADMISSION:  Chest pain.   HISTORY:  This 70 year old female complex patient is admitted for  evaluation of prolonged chest discomfort relieved with nitroglycerin.  The patient has a previous history of diabetes mellitus which is insulin  dependent.  She has a longstanding history of chest pain and has been  evaluated in the past with catheterizations in 1998 as well as in  December 2004 that have shown only mild irregularities in the LAD.  She  later had gained about 100 pounds of weight and had gastric bypass in  2006. Following gastric bypass, she had difficulty with fluid retention  and was seen briefly at that time and had normal ventricular function.  She had an adenosine Cardiolite scan done in April 2007 at the office,  and this showed some ST depression with adenosine infusion.  She had  breast attenuation noted on her Cardiolite scan.  She also had an  echocardiogram that was unremarkable.   She has a previous history of sleep apnea and some asthma in the past  also.  She has significant fibromyalgia as well as osteoarthritis.  She  evidently had some eye problems and was due to go to her ophthalmologist  and was on the way to his office today when she had the onset of  anterior substernal squeezing pain with radiation to the jaw while  driving to his office.  She then went into his office, was taken to an  examination room, and EMS was called.  She was given nitroglycerin as  well as aspirin, and her pain subsided. The total duration was around 90  minutes.  She was seen in the emergency room and was found to have ST  depression present in the anterolateral leads.  She is currently pain  free and, although she wished to go home, it was recommended that she be  admitted to the hospital to rule out a myocardial infarction.   PAST MEDICAL HISTORY:  Is complex.  1. She has a previous history of diabetes mellitus and has been on      insulin the past but is currently just on glipizide.  2. She has a history of hyperlipidemia.  3. Hypertension.  4. Asthma.  5. Fibromyalgia.  6. Sleep apnea.  7. There is a remote history of gout.   PAST SURGERIES:  1. Appendectomy.  2. Hysterectomy.  3. Bilateral breast reduction.  4. Fractured ankle.  5. Gastric bypass February 2006.  6. Repair of ventral hernia in April 2007.   ALLERGIES:  1. ANTI-INFLAMMATORIES cause intolerance.  2. ANTIHISTAMINES cause intolerance.  3. CODEINE.  4. MORPHINE.   CURRENT MEDICATIONS:  1. Hyzaar 25/100 daily.  2. Aspirin 81 mg daily.  3.  Glipizide daily.  4. Prilosec 40 mg daily.  5. Tricor 145 mg daily.  6. Lipitor 20 mg daily.  7. Zyrtec 10 mg daily.  8. Multivitamins daily.   SOCIAL HISTORY:  She is divorced and remarried, met her current husband  on the Internet.  She does not smoke and does use alcohol to excess.  Does not get much regular exercise.   FAMILY HISTORY:  Father died of stroke and has history of heart disease.  Mother died of a stroke and has a history of CAD   REVIEW OF SYSTEMS:  She has been obese and has lost about 80 pounds of  weight.  She has diabetic retinopathy and wears eyeglasses.  She has a  history of sinusitis. There is a history of asthma and some dyspnea.  She also has had sleep apnea and wore CPAP in the past. She has had some  chronic abdominal pain and has had significant problems with her  surgeries.  She has chronic low back pain, fibromyalgia, complains of a  history of memory loss at times. She has some anxiety and depression.  She complains of seasonal allergies and does have  some diabetic  retinopathy as well as some neuropathy of her feet.   PHYSICAL EXAMINATION:  GENERAL:  She is an elderly woman who is obese,  and she wears an abdominal binder.  SKIN:  Warm and dry.  VITAL SIGNS:  Her blood pressure is 130/70, pulse 60 and regular.  HEENT:  EOMI.  PERRLA.  Conjunctivae and sclerae clear.  Fundi:  Diabetic retinopathy.  NECK:  Supple without  masses, JVD, thyromegaly or bruits.  LUNGS:  Clear to auscultation and percussion.  CARDIAC:  Normal S1 and S2.  No S3.  ABDOMEN:  Soft with some tenderness in the epigastric area.  EXTREMITIES:  Pulses are 1-2+.  Trace edema is noted.   A 12-lead EKG shows ST depression in the anterolateral leads which is  new from a previous EKG noted in the office.   Initial point-of-care enzymes were negative   IMPRESSION:  1. Prolonged episode of chest discomfort consistent with myocardial      ischemia with new ST depression laterally/  2. History of mild coronary artery disease.  3. Hypertension.  4. Hyperlipidemia.  5. History of asthma.  6. History of fibromyalgia.  7. History of sleep apnea.   RECOMMENDATIONS:  1. Complex patient and recommended followup in the hospital.  2. She will have a MI ruled out.  3. Will place her on heparin per pharmacy protocol.  4. With a new ST depression, likely will need to have a      catheterization today to evaluate whether or not she has any      progression of coronary artery disease.      Georga Hacking, M.D.  Electronically Signed     WST/MEDQ  D:  03/22/2007  T:  03/23/2007  Job:  [id number]   cc:   Lenon Curt. Chilton Si, M.D.

## 2010-10-14 NOTE — Cardiovascular Report (Signed)
[name], [name]                 ACCOUNT NO.:  [id number]   MEDICAL RECORD NO.:  [id number]          PATIENT TYPE:  INP   LOCATION:  2020                         FACILITY:  [facility]   PHYSICIAN:  Georga Hacking, M.D.DATE OF BIRTH:  [birth year]   DATE OF PROCEDURE:  01/25/2009  DATE OF DISCHARGE:                            CARDIAC CATHETERIZATION   PROCEDURE:  Left heart catheterization with coronary angiograms and left  ventriculogram.   INDICATIONS:  Recurrent chest pain in a patient with minimal coronary  disease in the past with no suitable explanation from GI reason.   COMMENTS ABOUT PROCEDURE:  The patient was brought to the cath lab and  prepped and draped in the usual manner.  After Xylocaine anesthesia a 6  French sheath was placed in right femoral artery percutaneously.  Versed  2 mg IV was used for sedation.  The arteries were selected with 6 French  catheters and a 30 mL ventriculogram was performed.  She was taken to  the holding area for sheath removal and tolerated the procedure well.   HEMODYNAMIC DATA:  Aorta post contrast 125/60, LV post contrast 125/0-  16.   ANGIOGRAPHIC DATA:   LEFT VENTRICULOGRAM:  Performed in the 30 degree RAO projection.  The  aortic valve was normal.  The mitral valve was normal.  The left  ventricle appears normal in size.  Estimated ejection fraction is 60%.  Coronary arteries arise and distribute normally.  Minimal calcification  noted in the proximal left anterior descending.  The left main coronary  artery is normal.  Left anterior descending has mild calcification  proximally.  There is mild 30% proximal narrowing which is unchanged  from a previous episode catheterization.  The left anterior descending  contained scattered irregularities in the distal vessel.  A small first  diagonal branch has moderate irregularity.  Circumflex coronary artery  has a moderate to large intermediate branch that is free of disease.  The circumflex  contains a single marginal artery and is free of disease.  The right coronary artery has a large posterior descending artery and  contains no significant stenosis.  There are scattered irregularities  noted.   IMPRESSION:  1. Mild coronary artery disease involving the proximal LAD (left      anterior descending) with some mild calcification.  No significant      obstructive stenoses are noted in the other vessels.  2. Normal LV (left ventricle) function.   RECOMMENDATIONS:  Treat presumptively for coronary spasm.     Georga Hacking, M.D.  Electronically Signed    WST/MEDQ  D:  01/25/2009  T:  01/25/2009  Job:  [id number]   cc:   Lenon Curt. Chilton Si, M.D.

## 2010-10-14 NOTE — Consult Note (Signed)
[name], [name] NO.:  [id number]   MEDICAL RECORD NO.:  [id number]          PATIENT TYPE:  INP   LOCATION:  2020                         FACILITY:  [facility]   PHYSICIAN:  Bernette Redbird, M.D.   DATE OF BIRTH:  [birth year]   DATE OF CONSULTATION:  01/23/2009  DATE OF DISCHARGE:                                 CONSULTATION   Dr. Donnie Aho asked me to see this 70 year old female because of recurring  epigastric pain.   The patient is known to me from previous evaluation for irritable bowel  syndrome and screening colonoscopy, and was seen on a recent  hospitalization about a week ago by my partner, Dr. Evette Cristal, because of  symptoms similar to that which she presents with today, namely,  epigastric pain that awakened her out of sleep about 2:30 this morning  radiating up into the chest, neck, and top of her head, and through to  the back between her shoulder blades.  Endoscopic evaluation at the time  of her last hospitalization was unrevealing, status post Roux-en-Y  gastric bypass surgery for obesity, and a barium swallow at that time  did not show any evidence of esophageal spasm or obstruction.  Liver  chemistries and lipase have been normal.  The episode she had last night  was longer than usual, lasting hours, although she is free of pain at  this time following admission to the hospital and administration of  medication.  Note that there is no family history of gallbladder disease  and an abdominal ultrasound on her recent hospitalization was negative  for gallstones or biliary ductal dilatation.   PAST MEDICAL HISTORY:   ALLERGIES:  Multiple medication allergies including CODEINE, MORPHINE,  CEPHALOSPORINS, CIPRO, TESSALON, DARVOCET, DILAUDID, ALBUTEROL, ADVAIR.   Outpatient medications are pertinent for high-dose omeprazole, 40 mg  b.i.d., as well as glipizide, Lipitor, Celexa, daily 81 mg aspirin,  sliding scale insulin, Avapro.   OPERATIONS:  1. The  above-mentioned gastric bypass surgery 4 years ago, which led      to about a 100-pound weight loss.  2. Appendectomy.  3. Hysterectomy.  4. Ventral hernia repair with mesh.   MEDICAL ILLNESSES:  1. Type 2 diabetes.  2. Hyperlipidemia.  3. Hypertension.  4. Asthma.  5. Fibromyalgia.  6. Sleep apnea.  7. There is no known history of coronary artery disease and previous      cardiac catheterization by Dr. Donnie Aho about 2 years ago was      essentially negative.   HABITS:  Nonsmoker, nondrinker.   FAMILY HISTORY:  Negative for biliary tract disease.   SOCIAL HISTORY:  Nonsmoker, rare ethanol.   REVIEW OF SYSTEMS:  The patient has gained back about 18 pounds in  association with the current illness.  She had some nausea with her  attack last night but did not have vomiting.  No associated shortness of  breath.  She does note that sometimes when she turns her abdomen in  certain way, it can cause upper abdominal pain.  In the past, she has  had experiences during pain where it  hurt to swallow liquids, but she  did not try that last night to see if spasm of the esophagus was  present.   PHYSICAL EXAMINATION:  GENERAL:  An overweight, pleasant, articulate  Caucasian female in no evident distress.  VITAL SIGNS:  Temp 99.4, blood pressure 119/80, pulse 95, afebrile.  She  is anicteric and without pallor.  CHEST:  Clear to auscultation.  HEART:  Normal, without gallops, rubs, murmurs, or arrhythmias.  ABDOMEN:  Quiet.  It is substantially obese.  There is no overt  herniation.  There is no significant tympany.  No organomegaly,  guarding, mass, tenderness, or peritoneal findings are evident.  NEUROLOGIC:  The patient is coherent and appropriate without obvious  cranial nerve or focal motor deficits.   LABORATORY DATA:  White count 10,000 with 91 polys and 7 lymphs,  hemoglobin 15.0 with an MCV of 89, platelets 252,000, BUN 18, creatinine  1.0, glucose 243, potassium 3.3.  Liver  chemistry is essentially normal  except for total bilirubin of 1.6, AST 24, ALT 15, alk phos normal at  65, albumin is 3.1.   X-RAYS:  There is a dilated loop of bowel in the left abdomen similar to  that noted 2 years ago with an air-fluid level, possibly related to her  Roux-en-Y gastric bypass.  No other evidence of bowel obstruction.   Chest x-ray shows some possible airway thickening.   IMPRESSION:  Nonspecific upper abdominal and chest pain with nausea and  perhaps some vomiting, recurring episodes, etiology unclear.  Of course,  this could be coronary artery disease, but other thoughts which come to  mind would include esophageal spasm or atypical acalculous, biliary  tract disease.  A pulmonary embolism could conceivably present in this  fashion, especially allowing for the patient's recent hospitalization  and obese body habitus.   PLAN:  1. CT angio of chest to rule out pulmonary embolism and CT of abdomen      for evaluation of her upper abdominal pain.  2. Lab work to include a hemoglobin A1c to see if significant      impairment of glucose control could be feeding into her symptoms.      I will also obtain a direct bilirubin level to see if the patient's      mild hyperbilirubinemia, reflux show Gilbert syndrome.  3. We will obtain a hepatobiliary scan with gallbladder ejection      fraction to help see if this could be functional gallbladder      disease.  4. At the moment, I do not think that repeat endoscopy or colonoscopy      would be likely to be of benefit to the patient.   I appreciate the opportunity to have seen this patient consultation with  you.           ______________________________  Bernette Redbird, M.D.     RB/MEDQ  D:  01/23/2009  T:  01/24/2009  Job:  [id number]   cc:   Georga Hacking, M.D.  Lenon Curt Chilton Si, M.D.  Tera Mater. Evlyn Kanner, M.D.

## 2010-10-14 NOTE — H&P (Signed)
NAME:  [name], [place] NO.:  [id number]   MEDICAL RECORD NO.:  [id number]          PATIENT TYPE:  EMS   LOCATION:  MAJO                         FACILITY:  [facility]   PHYSICIAN:  Georga Hacking, M.D.DATE OF BIRTH:  Aug 14, 1940   DATE OF ADMISSION:  01/23/2009  DATE OF DISCHARGE:                              HISTORY & PHYSICAL   REASON FOR ADMISSION:  Vomiting, hypotension, chest pain.   This 70 year old female was discharged from the hospital about a week  ago following an admission with chest pain and vomiting.  She has a  previous history of hypertension, diabetes and previous bariatric  surgery for obesity and has had two previous hernia repairs.  She had  chest discomfort in October of 2008 relieved with nitroglycerin and has  had longstanding chest pains with past catheterizations in 1990 as well  as December of 2004 showing only mild irregularities in the LAD.  Catheterization done March 22, 2007, showed some calcification in the  LAD with less than a 30% stenosis.  She has had chronic abdominal pain  and has had possible small bowel obstructions previously.  During her  most recent hospitalization on August 14 she has had spontaneous chest  discomfort associated with vomiting.  It was atypical at this point and  it was thought to represent a gastroesophageal process.  During that  admission she had a negative CT angiogram and also had a negative barium  swallow.  Dr. Herbert Moors saw the patient and did an upper endoscopy  that showed no significant pathology noted.  He did not really leave any  clear direction as to what the chest pain was from.  Since going home  she had one further episode of chest discomfort.  She describes it as  mid sternal and radiating to the jaw and possibly up into her neck and  head at times.  It is not precipitated with exertion or with food.  Yesterday she received a Reclast injection that was ordered by Dr.  Beather Arbour for  osteoporosis.  She felt poorly in the afternoon and  last evening developed some weakness and then had some vomiting as well  as chest discomfort radiating up into her jaw.  This occurred on and off  during the evening and this morning she called the office complaining of  chest pain radiating to the jaw and was advised to go to the emergency  room.  She was brought up here at which time the pain had resolved and  she turned around and went home but then had recurrence of pain and was  transported here by EMS.  She is currently not having chest pain but is  nauseated.  She was found to be hypotensive and had significant hives  and in the emergency room was given 2 liters of fluid as well as  intravenous Solu-Medrol as well as some Benadryl that she had taken at  home.  She was admitted at this time for further evaluation.   PAST MEDICAL HISTORY:  Is remarkable for hypertension, diabetes,  significant obesity, anxiety and depression,  history of vocal cord  dysfunction and hiatal hernia as well as hyperlipidemia.   ALLERGIES:  She states that she is allergic to CODEINE, MORPHINE,  BENICAR, NONSTEROIDAL ANTI-INFLAMMATORIES, ANTIHISTAMINES.   MEDICATIONS:  1. Include glipizide 10 mg twice a day.  2. Lipitor 20 mg at night.  3. Celexa 40 mg daily.  4. Aspirin 81 mg daily.  5. Sliding scale insulin.  6. Omeprazole twice daily.  7. Flonase daily.  8. Avapro 40 mg b.i.d.  9. Humulin 21 units daily.   SOCIAL HISTORY:  She is remarried and lives with her husband.  She is a  nonsmoker and does not use alcohol in excess.   FAMILY HISTORY:  Mother had a myocardial function in her 46s.  Father  had a myocardial function in his 48s.   REVIEW OF SYSTEMS:  She has significant rash that has been present since  the Reclast injection.  She has no significant eye, ear, nose or throat  difficulties.  She has had a history of asthma and has some mild  dyspnea.  CARDIAC:  See HPI.  GI:  See HPI.  She  has had a previous  urinary tract infection the last admission and was treated with Cipro as  well as Septra.  She has significant arthritis with a history of anxiety  and depression.  She has no history of stroke or TIA but does have  occasional headaches.  Other than as noted above remainder of systems is  unremarkable.   PHYSICAL EXAMINATION:  GENERAL:  On examination she is an elderly white  female who is obese and currently in no acute distress.  VITAL SIGNS:  Blood pressure initial in the emergency room is 70/50 and  is currently on my exam 110/70, pulse was 76.  SKIN:  Skin is warm and dry.  Skin shows scattered urticaria present  over the abdomen and some urticaria present over both forearms.  ENT:  EOMI.  PERRLA.  NECK:  Neck veins were flat.  There were no carotid bruits.  LUNGS:  Clear to A and P.  CARDIAC:  Normal S1, S2, no S3.  ABDOMEN:  Obese and somewhat tympanitic.  Mildly tender to percussion  and palpation.  No rebound is noted.  Pulses were 1-2+.  There was no  edema noted.  NEUROLOGICAL:  Normal.   EKG shows sinus rhythm with minor nonspecific changes in the anterior  leads.   LAB DATA:  Shows hemoglobin of 15, hematocrit of 44, potassium is 3.3,  glucose is 243, BUN 18, creatinine is 1, lactic acid is 2.3 which is  mildly elevated.  CPK, MB and troponin were negative.   IMPRESSION:  1. Prolonged chest discomfort with radiation to the jaw.  This could      be consistent with angina or coronary spasm or esophageal spasm.      She did have a catheterization done a little less than 2 years ago      and has had multiple catheterizations over the years showing      minimal disease.  2. Recent vomiting.  This could be due to partial small bowel      obstruction that has been seen on x-ray or could be reaction to      Reclast.  3. Hypotension with hives, probably allergic reaction to Reclast.  4. Hypertension.  5. History of anxiety and depression.  6.  Hyperlipidemia.  7. Insulin dependent diabetes.   RECOMMENDATIONS:  A very complex patient; at this  time she will be kept  n.p.o.  I will get a GI consult.  She will be given IV fluids and we  will cover her with sliding scale insulin.  She will be given Benadryl  and IV Solu-Medrol.  If she continues to have anterior chest pain  radiating into the jaw she will likely require catheterization to  exclude coronary disease as a cause.  Other possibility would be a  workup for esophageal spasm.      Georga Hacking, M.D.  Electronically Signed     WST/MEDQ  D:  01/23/2009  T:  01/23/2009  Job:  [id number]   cc:   Lenon Curt. Chilton Si, M.D.  Gretta Cool, M.D.  Bernette Redbird, M.D.

## 2010-10-14 NOTE — Discharge Summary (Signed)
[name], [name]                 ACCOUNT NO.:  [id number]   MEDICAL RECORD NO.:  [id number]          PATIENT TYPE:  INP   LOCATION:  2020                         FACILITY:  [facility]   PHYSICIAN:  Georga Hacking, M.D.DATE OF BIRTH:  [birth year]   DATE OF ADMISSION:  01/23/2009  DATE OF DISCHARGE:  01/26/2009                               DISCHARGE SUMMARY   FINAL DIAGNOSES:  1. Chest pain of undetermined etiology - myocardial infarction ruled      out.      a.     Cardiac catheterization showing very mild calcification and       very minimal nonobstructive disease involving the coronary       arteries which has been stable over 12 years - it is not suspected       that the etiology of the chest pain is cardiac.      b.     Suspected esophageal spasm with reproduction of symptoms       with swallowing and relief with nitroglycerin with no EKG changes       noted.  2. Suspected esophageal spasm.  3. Allergic reaction to Reclast with hives and hypotension.  4. Diabetes mellitus insulin dependent.  5. Obesity with previous bariatric surgery.  6. Hypertension.  7. Hyperlipidemia.  8. Anxiety and depression.   PROCEDURES:  CT scan of chest and abdomen, cardiac catheterization.   CONSULTATION:  Bernette Redbird, M.D.   HISTORY:  The patient is a 70 year old female who has a previous history  of bariatric surgery, hypertension, hyperlipidemia, obesity and previous  reflux.  She was hospitalized last week at [facility] and had  a myocardial infarction ruled out.  The symptoms sounded GI in origin  and she was seen in consultation by Dr. Evette Cristal who was covering for Dr.  Matthias Hughs who did an upper endoscopy and a barium swallow which was  unremarkable.  Since discharge she had one episode of chest pain.  She  received a Reclast injection the day prior to admission under the  purview of Dr. Nicholas Lose.  She felt poorly that evening and developed  recurrent chest pain that was midsternal  radiating into the jaw and up  into her neck and head.  She called the office and was advised to come  to the hospital.  The pain had resolved by the time she got here but  when she returned home the pain returned and she was brought into the  emergency room.  She had had vomiting the night before, was mildly  hypotensive and had a rash and it was suspected that she was having an  allergic reaction to Reclast also.  Please see the previously dictated  history and physical for the remainder of the details.   HOSPITAL COURSE:  The patient was seen initially in the emergency room  and had an injection of Solu-Medrol as well as Benadryl.  Initial  cardiac enzymes were all negative and serial cardiac enzymes showed  normal CPK-MB and negative troponin.  Lab data showed a sodium of 136,  potassium 3.3, chloride  103, CO2 24, glucose 243, BUN 18, creatinine 1,  normal liver enzymes.  Amylase was 25 and lipase was normal.  She was  given supplemental potassium.  She continued on IV Solu-Medrol as well  as Benadryl.  She continued to have some episodic chest pain and was  seen by Dr. Matthias Hughs.  A PIPIDA scan of the gallbladder showed normal  gallbladder ejection fraction, no evidence of obstruction.  CT scan of  the abdomen showed a retroesophageal right subclavian artery.  No  evidence of pulmonary embolus.  There were small hypodensities noted  within the left lobe of the liver unchanged from prior and noted to be  benign in nature.  There was thought to be hypodense lesions in the  spleen that were small.  The gastrojejunal anastomosis was grossly  normal.  Large anterior abdominal wall mesh noted with mild eventration  of the omentum and small bowel loops but no definite herniation noted.  There was diffuse soft tissue stranding superficial to the mesh.  There  was no suggestion of abscess.  There was a focally distended ileal loop  that appeared to be stable in size and some calcification along  the  distal abdominal aorta.  There was not felt to be any acute abnormality  noted on this study.  This was discussed fully with Dr. Matthias Hughs.  Lipitor and Celexa were looked at and held.  Dr. Matthias Hughs felt in the  absence of any clear GI tract problem that he would favor a cardiac  workup.  The patient underwent cardiac catheterization on January 25, 2009, even though she just had one less than 2 years previously.  Ventricular function was normal.  Left main coronary artery was normal.  There was mild calcification involving the LAD with 30% proximal  stenosis.  There was no significant disease in the right coronary artery  or circumflex system.  It should be noted that she has basically had 4  catheterizations since 1998, the last in October of 2008 and then the  one on August 27 and that her coronary anatomy has been essentially  unchanged over these 12 years.  Later that afternoon after the  catheterization she was having typical chest pain after eating lunch.  Dr. Matthias Hughs had her drink a half a cup of tap water which increased her  symptoms and she was given nitroglycerin times two with relief.  He  thought that this was evidence for esophageal spasm and we started her  on amlodipine and continued her on nitroglycerin.  She had some mild  hypoglycemic events likely due to eating a better diet and this improved  with adjustment in her insulin dosage and with eating.  By August 28 she  was in stable condition and is to be discharged at this time in improved  condition.  She has had a thorough GI workup and is to continue to  follow with Dr. Matthias Hughs.  She had a small ecchymosis at the  catheterization site.   DISCHARGE MEDICATIONS:  1. At the present time she is discharged on amlodipine 5 mg daily.  2. Insulin as needed, sliding scale.  3. Nitroglycerin 0.4 mg sublingual p.r.n.  4. Flonase spray as needed.  5. Aspirin 81 mg daily.  6. Calcium daily.  7. Citalopram 40 mg daily.  8.  Glipizide XL 10 mg b.i.d.  9. Humulin 21 units at bedtime.  10.Hyzaar 100/12.5 mg daily as before.  11.Furosemide 40 mg daily.  12.Prilosec 40 mg b.i.d.  13.Vitamin D daily.   FOLLOWUP:  It is recommended that she follow up with Dr. Chilton Si, Dr.  Evlyn Kanner for diabetic control, Dr. Matthias Hughs.  She is also to call Dr. Wenda Low for followup of her bariatric surgery and to see if this by  chance has any relation to that.  I will see her back in followup in 2  weeks.      Georga Hacking, M.D.  Electronically Signed     WST/MEDQ  D:  01/26/2009  T:  01/26/2009  Job:  [id number]   cc:   Lenon Curt Chilton Si, M.D.  Tera Mater. Evlyn Kanner, M.D.  Bernette Redbird, M.D.  [place] Daphine Deutscher, MD

## 2010-10-14 NOTE — Op Note (Signed)
NAMEISLAND, DOHMEN                 ACCOUNT NO.:  [id number]   MEDICAL RECORD NO.:  [id number]          PATIENT TYPE:  INP   LOCATION:  1404                         [facility]ACILITY:  [facility]   PHYSICIAN:  [name], M.D.   DATE OF BIRTH:  26-Jan-1941   DATE OF PROCEDURE:  01/15/2009  DATE OF DISCHARGE:  01/16/2009                               OPERATIVE REPORT   Upper gastrointestinal endoscopy.   INDICATIONS:  Chest pain, epigastric pain.   Informed consent was obtained after explanation of the risks of  bleeding, infection and perforation.   PREMEDICATION:  Fentanyl 50 mcg IV, Versed 5 mg IV.   PROCEDURE:  With the patient in the left lateral decubitus position, the  Pentax gastroscope was inserted into the oropharynx and passed into the  esophagus.  The esophagus looked normal in its entirety.  The scope was  then advanced into the gastric pouch which looked normal.  The patient  has had a prior history of the Roux Y gastric bypass.  The intestinal  limb coming off of the gastric pouch was inspected for several  centimeters and it looked normal.  The scope was removed.  She tolerated  the procedure well without complications.   IMPRESSION:  Normal postoperative upper endoscopy.   There is nothing here to explain the patient's pain.           ______________________________  [name], M.D.     SFG/MEDQ  D:  01/16/2009  T:  01/16/2009  Job:  [id number]   cc:   Georga Hacking, M.D.  Fax: [phone]  Email: [email or web address]

## 2010-10-14 NOTE — Consult Note (Signed)
[name], [name] NO.:  [id number]   MEDICAL RECORD NO.:  [id number]          PATIENT TYPE:  INP   LOCATION:  1404                         FACILITY:  [facility]   PHYSICIAN:  [name], M.D.   DATE OF BIRTH:  12/24/1940   DATE OF CONSULTATION:  01/14/2009  DATE OF DISCHARGE:                                 CONSULTATION   REFERRING PHYSICIAN:  Georga Hacking, M.D.   REASON FOR CONSULTATION:  The patient is a 70 year old white female with  a history of epigastric pain which radiates up into her chest, then neck  and then the back of her head.  This has been going on for 2 weeks.  She  has had six episodes of this pain occur.  The pain would last sometimes  only 3 or 4 minutes but other times has lasted up to 30 minutes.  The  pain was severe enough at one point that she came to the emergency room  where she was admitted to the hospital because of the chest pain to have  cardiac issues ruled out.  Dr. Viann Fish called [place] this morning  because cardiac issues according to him have been ruled out and he  requested further GI evaluation to try to explain this pain.  The  patient has a history of nonobstructive coronary artery disease.  She  also has a history of a Roux-en-Y gastric bypass and has had a ventral  hernia repair with mesh.  The patient still has her gallbladder.  She  states that she has been on Prilosec b.i.d. and she denies having  heartburn.  She did have a little vomiting early on with these episodes  of pain.  The patient reports that the pain seemed to come on when she  was out working in her garden and bending over and also noticed that  when she twisted her torso  towards the left, it would hurt.   A barium swallow was done yesterday which was normal.   PAST HISTORY:  1. History of diabetes mellitus.  2. Hyperlipidemia.  3. Hypertension.  4. Asthma.  5. Fibromyalgia.  6. Sleep apnea.   PAST SURGICAL HISTORY:  In addition to  above:  1. Appendectomy.  2. Hysterectomy.  3. Bilateral breast reduction.  4. Fractured ankle.   ALLERGIES:  Intolerance to:  1. ANTI-INFLAMMATORY MEDICATIONS.  2. ANTIHISTAMINE.  3. CODEINE.  4. MORPHINE.   MEDICATIONS:  Noted on chart.   SOCIAL HISTORY:  Does not smoke, rarely drinks alcohol.   REVIEW OF SYSTEMS:  Negative except for above.   PHYSICAL:  She is in no distress, nonicteric.  HEART:  Regular rhythm.  No murmurs.  LUNGS:  Clear.  ABDOMEN:  Is soft.  There is some mild tenderness in the epigastric area  but no rebound or guarding.   IMPRESSION:  Abdominal and chest pain of uncertain etiology, rule out  upper gastrointestinal source, rule out gallbladder as the source.   PLAN:  We will schedule the patient for an esophagogastroduodenoscopy  and an abdominal ultrasound to further investigate.  ______________________________  [name], M.D.     SFG/MEDQ  D:  01/14/2009  T:  01/14/2009  Job:  [id number]   cc:   Georga Hacking, M.D.  Fax: [phone]  Email: [email or web address]   Bernette Redbird, M.D.  Fax: [phone]

## 2010-10-14 NOTE — Cardiovascular Report (Signed)
[name], [name] NO.:  [id number]   MEDICAL RECORD NO.:  [id number]          PATIENT TYPE:  INP   LOCATION:  2010                         FACILITY:  [facility]   PHYSICIAN:  Georga Hacking, M.D.DATE OF BIRTH:  [birth year]   DATE OF PROCEDURE:  03/23/2007  DATE OF DISCHARGE:  03/23/2007                            CARDIAC CATHETERIZATION   REASONS FOR PROCEDURE:  A 70 year old female who has a prolonged history  of chest discomfort radiating to the jaw with new ST depression  anteriorly.  She has a history of mild coronary artery disease and  multiple coronary risk factors.   PROCEDURE:  Left heart catheterization with coronary angiograms and left  ventriculogram.   COMMENTS ABOUT PROCEDURE:  The right femoral artery was entered using a  single anterior needle wall stick.  The procedure was done with [place]  catheters.  A 30 mL ventriculogram was performed.  Following the  procedure a right femoral artery angiogram was made and the artery was  closed percutaneously with Angio-Seal system with excellent hemostasis.  She tolerated the procedure well.   HEMODYNAMIC DATA:  Aorta post contrast 153/68.  LV postcontrast 153/10  to 17.   ANGIOGRAPHIC DATA:  Left ventriculogram:  Performed in the 30-degree RAO  projection.  The aortic valve is normal.  The mitral valve is normal.  The left ventricle appears normal in size.  Estimated ejection fraction  is 60-65%.  Coronary arteries arise and distribute normally.  There is  mild calcification noted in the proximal LAD.  The left main coronary is  normal.  The left anterior descending has a focal area of calcification  and mild, less than 30% proximal narrowing.  A large intermediate branch  arises that is free of disease.  The circumflex coronary has a large  marginal artery and a smaller continuation branch that is also free of  disease.  The right coronary artery ends in a posterior descending  artery and is free of  disease.   IMPRESSION:  1. Mild coronary artery disease involving the proximal left anterior      descending with minimal disease elsewhere.  2. Normal left ventricular function.  3. Successful Angio-Seal closure of the right femoral artery.   RECOMMENDATIONS:  May discharge later on today.  Follow up in 1 week.  May need to get GI consult.      Georga Hacking, M.D.  Electronically Signed     WST/MEDQ  D:  03/23/2007  T:  03/24/2007  Job:  [id number]   cc:   Lenon Curt. Chilton Si, M.D.  Tera Mater. Evlyn Kanner, M.D.

## 2010-10-14 NOTE — Discharge Summary (Signed)
[name], [name]                 ACCOUNT NO.:  [id number]   MEDICAL RECORD NO.:  [id number]          PATIENT TYPE:  INP   LOCATION:  1228                         FACILITY:  [facility]   PHYSICIAN:  Charlaine Dalton. Sherene Sires, MD, FCCPDATE OF BIRTH:  11-Nov-1940   DATE OF ADMISSION:  09/27/2008  DATE OF DISCHARGE:  09/28/2008                               DISCHARGE SUMMARY   DISCHARGE DIAGNOSES:  1. Acute respiratory distress in the setting of vocal cord      dysfunction.  2. Acute bronchitis.  3. Depression/anxiety.  4. Hypertension - change hyzaar to avapro this admit (trial basis)  5. Diabetes mellitus with hyperglycemia.   LINES AND TUBES:  The patient had 1 peripheral line inserted during  hospital admission.   CULTURES:  None.   ANTIBIOTICS:  Avelox initiated on April 27th for acute bronchitis.   BEST PRACTICE:  The patient was placed on SCDs during admission for DVT  prevention as well as Protonix for stress ulcer prevention.  She  initially was maintained with n.p.o. status and advanced diet to 1500  calorie ADA.   KEY EVENTS AND STUDIES:  The patient underwent a CT of the sinuses on  April 29th which revealed findings of the right sphenoid sinus was  completely opacified, mucous material is also present in the left  sphenoid sinus.  Ethmoid air cells and left frontal sinuses are clear.  The right frontal sinus is absent.  Minimal mucosal thickening in the  left maxillary sinus, mastoid air cells are clear, there is no bony  destruction, soft tissues are within normal limits.  On April 30th, a  chest x-ray reveals low lung volumes with minimal bibasilar atelectasis.   HISTORY OF PRESENT ILLNESS:  [name] is a 70 year old white female  with a past medical history of vocal cord dysfunction and recent  treatment for acute bronchitis on April 27th.  She presented to the  pulmonary office with audible wheezing after being transferred from an  optometrist's office to the pulmonary  office.  She had a significant  episode of shortness of breath and wheezing and was transferred to the  [facility] intensive care unit, via EMS, for further management of  respiratory distress.   HOSPITAL COURSE BY DISCHARGE DIAGNOSIS:  1. Acute respiratory distress in the setting of vocal cord      dysfunction.  The patient was monitored overnight in the intensive      care unit for respiratory issues.  She, on arrival to the intensive      care unit, had significantly improved and reported feeling better.      Suspect that there was a large component of anxiety with episode.  2. Acute bronchitis.  Therapy was initiated on April 27th for acute      bronchitis with Avelox and prednisone taper.  She will continue      Avelox as well as prednisone taper post discharge.  No further      antibiotics or cultures were initiated.  IV Solu-Medrol was started      as inpatient therapy.  3. Depression/anxiety.  While inpatient her home medical regimen of      Celexa was continued, as well as p.r.n. Ativan for severe anxiety      which she did not use.  4. Hypertension.  Patient's home medication regimen Hyzaar  will be      discontinued and she will be started on Avapro to eliminate      possible aggravating factors of vocal cord dysfunction (based on      anectdotal reports of ace-like effects in cozaar/hyzaar but not the      other arbs) .  She will also continue her Lipitor for      hypercholesterolemia.  5. Diabetes mellitus with hyperglycemia.  Patient's home therapy      includes sliding scale of Humulin R as well as Humulin N that she      adjusts according to blood sugars.  During the ICU admission she      was placed on the ICU hyperglycemia protocol and subsequently was      placed on insulin drip over night.  Blood sugars have resolved to      the 170s at time of discharge.  Likely culprit for elevation in      blood sugars IV Solu-Medrol as well as prior prednisone.  6. Sinusitis.   The patient will be given a regimen of p.r.n. nasal      saline rinses as well as Flonase prescription 2 sprays daily and      she is to continue her daily Zyrtec as well.   DISCHARGE INSTRUCTIONS:  1. Diet:  Patient is to adhere to a low-sodium, heart-healthy,      diabetic diet.  2. Activity:  Activity as tolerated.   MEDICATIONS:  Discharge medications to include:  1. Glipizide XL 10 mg by mouth twice daily.  2. Lipitor 20 mg by mouth daily.  3. Celexa 40 mg by mouth daily.  4. Aspirin 81 mg by mouth daily.  5. Humulin R sliding scale.  6. Humulin N sliding scale.  7. Avelox 400 mg by mouth once daily for 3 more days, with a stop date      of May 3rd.  8. Prednisone taper to include 40 mg times 3 days, then 30 mg times 3      days, then 20 mg times 3 days, then 10 mg times 3 days and then      stop.  9. Tussionex as needed for cough.  10.Protonix 40 mg by mouth twice daily.  11.Avapro 75 mg p.o. mouth daily.  12.Nasal saline rinse daily times 3 days, then as needed.  13.Flonase 2 sprays each nostril daily.   FOLLOWUP:  The patient is to follow up with Rubye Oaks, nurse  practitioner, on Wednesday, May 5th, at 11:15.   DISPOSITION AT TIME OF DISCHARGE:  The patient has met maximum benefit  of inpatient therapy and is stable at time of discharge.      Canary Brim, NP      Charlaine Dalton. Sherene Sires, MD, [facility]  Electronically Signed    BO/MEDQ  D:  09/28/2008  T:  09/28/2008  Job:  [id number]   cc:   Oretha Milch, MD  [address] [place] [address]

## 2010-10-14 NOTE — Discharge Summary (Signed)
[name], [name]                 ACCOUNT NO.:  [id number]   MEDICAL RECORD NO.:  [id number]          PATIENT TYPE:  INP   LOCATION:  1404                         [facility]ACILITY:  [facility]   PHYSICIAN:  Georga Hacking, M.D.DATE OF BIRTH:  [birth year]   DATE OF ADMISSION:  01/12/2009  DATE OF DISCHARGE:  01/16/2009                               DISCHARGE SUMMARY   FINAL DIAGNOSES:  1. Chest pain of undetermined etiology - myocardial infarction ruled      out.      a.     Endoscopy showing no significant hiatal hernia or       gastrointestinal pathology.      b.     Negative cardiac enzymes.      c.     Atypical chest pain with previous history of only mild       obstructive coronary disease less than 1-1/2 years ago.  2. Anxiety.  3. Obesity with previous bariatric surgery.  4. Hypertension.  5. Diabetes mellitus.  6. Hyperlipidemia.   PROCEDURE:  Upper endoscopy, GI swallow.   CONSULTATIONS:  [name], M.D.   HISTORY:  The patient is a 70 year old female who has a history of mild  nonobstructive coronary artery disease and a significant ventral hernia.  She had been seen recently in the office with substernal chest pain that  was atypical.  She was in for a routine visit and was due to have a GI  followup.  She was admitted complaining of vomiting, indigestion pain,  feeling weak during the episodes, experienced chest pain at rest and was  brought in to rule out a myocardial infarction.  Please see the  previously dictated history and physical for remainder of the details.   HOSPITAL COURSE:  Lab data on admission showed a normal CBC, chemistry  panel and negative cardiac enzymes x3.  EKG showed minor nonspecific  ST/T wave changes.  She was placed initially on heparin and then this  was stopped in view of her cath 1-1/2 years previous.  She had an upper  GI swallow because of episodes of vomiting in association with food and  not with exertion.  The upper GI series was  unremarkable.  She  eventually had an upper endoscopy in consultation by Dr. Wandalee Ferdinand who  found the upper endoscopy to be unremarkable.  She was ambulatory, had  no recurrent episodes of pain and we sent her home.  She is due to  followup as an outpatient.   DISCHARGE MEDICATIONS:  1. Glipizide 10 mg b.i.d.  2. Lipitor 20 mg q.h.s.  3. Celexa 40 mg daily.  4. Aspirin 81 mg daily.  5. Sliding-scale insulin.  6. Protonix 40 mg b.i.d.  7. Avapro 40 mg b.i.d.  8. Flonase 2 sprays daily.  9. Humulin insulin 21 units subcu daily.   DIAGNOSTICS:  CT angiogram of the chest done showed no evidence of  coronary dissection, no evidence of pulmonary emboli, the small hiatal  hernia is noted.   DISCHARGE INSTRUCTIONS:  It is recommended that she follow up in the  office  and she is to call if there are problems.  She is to call for an  appointment in 1 week.      Georga Hacking, M.D.  Electronically Signed     WST/MEDQ  D:  01/23/2009  T:  01/23/2009  Job:  [id number]   cc:   Lenon Curt. Chilton Si, M.D.

## 2010-10-14 NOTE — Discharge Summary (Signed)
[name], [name]                 ACCOUNT NO.:  [id number]   MEDICAL RECORD NO.:  [id number]          PATIENT TYPE:  INP   LOCATION:  2010                         FACILITY:  [facility]   PHYSICIAN:  Georga Hacking, M.D.DATE OF BIRTH:  1941/04/22   DATE OF ADMISSION:  03/22/2007  DATE OF DISCHARGE:  03/23/2007                               DISCHARGE SUMMARY   FINAL DIAGNOSES:  1. Prolonged chest discomfort of unknown etiology - myocardial      function ruled out.      a.     Catheterization showing insignificant coronary artery       disease with mild calcification of the proximal left anterior       descending with a mild proximal stenosis but no other a severe       obstructive stenoses noted.      b.     Normal left ventricular systolic function.  2. Diabetes mellitus.  3. Hypertension.  4. Hyperlipidemia.  5. Previous history of gastric bypass surgery.  6. History of fibromyalgia.   PROCEDURES:  Cardiac catheterization.   HISTORY:  This 70 year old female has a history of mild coronary artery  disease and has had a couple of catheterizations previously.  She has  been treated medically.  She had gastric bypass previously and has been  monitored for arrhythmias previously.  She has recently had some  vomiting and was going to see her ophthalmologist to deal with diabetic  retinopathy.  She had the onset of severe midsternal squeezing  substernal chest pain with radiation to the jaw associated with dyspnea.  She went her ophthalmologist's office and EMS was called and the  paramedics brought her here and gave her nitroglycerin and aspirin, and  her pain went away.  EKG showed some new ST depression, and she was  admitted at this time to rule out a myocardial infarction.  Please see  the previously-dictated history and physical for the remainder of the  details.   HOSPITAL COURSE:  Laboratory data on admission showed a hemoglobin of  11.2, hematocrit 33.7.  Repeat hemoglobin  the next day was the same.  Chemistry panel shows a sodium of 138, potassium 3.8, chloride 107, CO2  of 24, BUN 19, creatinine 0.66, glucose 108.  Cardiac enzymes were  negative x3.  Liver enzymes were normal.  TSH was 2.034.  Glycosylated  hemoglobin was 6.3.  Chest x-ray showed borderline cardiomegaly.   The patient was admitted to the hospital and begun on heparin.  She was  taken to the cardiac catheterization laboratory the next morning because  of the new EKG changes.  EKG the next morning showed some improvement.  She had normal left ventricular function.  There was no significant  disease of the circumflex, right coronary artery or intermediate branch.  The LAD had calcification proximally with mild irregularity proximally.  Her artery underwent percutaneous closure with an Angio-Seal device, and  she tolerated this well and was checked later on that afternoon.  It was  felt that she could go home and that the etiology of her pain could  be  noncardiac.  She is discharged at this time in improved condition.   DISCHARGE MEDICATIONS:  1. Glucotrol XL 10 mg b.i.d.  2. Hyzaar 12.5/50 mg daily.  3. Lipitor 20 mg daily.  4. Tricor 145 mg daily.  5. Zyrtec 10 mg daily.  6. Prilosec 40 mg b.i.d.  7. Humulin N 11 units subcutaneous nightly.  8. B-12 injections daily.  9. Multivitamins daily.  10.Calcium 1800 mg daily.  11.Vitamin D 30,000 units daily.  12.She is to continue her other medications.   She was given care instructions pertinent to the site of the  catheterization site.  She was instructed to limit her activity for a  day after the catheterization and may resume normal activities.  She is  to follow up with me in 1 week.  If she continues to have  gastrointestinal symptoms, she was advised to make an appointment with  Dr. Matthias Hughs.  She is to have follow up with Dr. Frederik Pear and Dr. Evlyn Kanner  as needed, and per regular appointments with them.      Georga Hacking,  M.D.  Electronically Signed     WST/MEDQ  D:  03/23/2007  T:  03/24/2007  Job:  [id number]   cc:   Bernette Redbird, M.D.  Tera Mater. Evlyn Kanner, M.D.  Lenon Curt Chilton Si, M.D.

## 2010-10-17 NOTE — Cardiovascular Report (Signed)
   NAME:  NERIAH, BROTT NO.:  [id number]   MEDICAL RECORD NO.:  [id number]                   PATIENT TYPE:  OIB   LOCATION:  2899                                 FACILITY:  [facility]   PHYSICIAN:  W. Ashley Royalty., M.D.         DATE OF BIRTH:  [birth year]   DATE OF PROCEDURE:  06/05/2002  DATE OF DISCHARGE:                              CARDIAC CATHETERIZATION   HISTORY:  A 70 year old female with increasing dyspnea, chest pain and a  history of mild coronary artery disease.  Unable to exercise testing because  of obesity, arthritis and asthma.   COMMENTS ABOUT PROCEDURE:  The patient tolerated the procedure well without  complications.  The femoral arteries were very deep and the right femoral  artery was entered with difficulty.  There was a single anterior needle wall  stick, however.  Following the procedure, she had good hemostasis and  peripheral pulses noted.   HEMODYNAMIC DATA:  Aorta post contrast 125/60.  LV post contrast 125/0 to  10.   ANGIOGRAPHIC DATA:  LEFT VENTRICULOGRAM:  Performed in the 30 degree RAO  projection, the aortic valve is normal.  The mitral valve is normal.  The  left ventricle appears normal in size.  The estimated ejection fraction is  55%.  Coronary arteries arise and distribute normally.  No significant  calcification is noted.  LEFT MAIN CORONARY ARTERY:  Appears normal.  LEFT ANTERIOR DESCENDING:  Mild 20 to 30% proximal stenosis.  Appears  unchanged from previous.  Distal vessel terminates at the apex, supplies two  diagonal branches.  CIRCUMFLEX CORONARY ARTERY:  Codominant to vessel.  A large intermediate  branch arises and contains no significant stenosis.  The vessel has a large  obtuse marginal artery and a twin posterior descending artery or a  posterolateral branch noted.  RIGHT CORONARY ARTERY:  The vessel ends at a posterior descending artery and  contains no significant stenosis.   IMPRESSION:  1.  Normal left ventricular function.  2. Mild coronary disease involving the proximal left anterior descending     artery with no significant focal stenosis noted.    RECOMMENDATIONS:  The catheterization films are essentially unchanged from  the previous films of four years ago.                                                Darden Palmer., M.D.    WST/MEDQ  D:  06/05/2002  T:  06/05/2002  Job:  [id number]   cc:   Tera Mater. Evlyn Kanner, M.D.  [address]  [place]  [place] [address]  Fax: [phone]

## 2010-10-17 NOTE — Procedures (Signed)
. [facility]  Patient:    NEESA, KNAPIK Visit Number: [id number] MRN: [id number]          Service Type: EMS Location: [facility] [place] Attending Physician:  Doug Sou Dictated by:   Florencia Reasons, M.D. Proc. Date: 09/12/01 Admit Date:  08/09/2001 Discharge Date: 08/09/2001   CC:         Jeannett Senior A. Evlyn Kanner, M.D.   Procedure Report  PROCEDURE PERFORMED:  Upper endoscopy.  ENDOSCOPIST:  Florencia Reasons, M.D.  INDICATIONS FOR PROCEDURE:  Longstanding reflux symptoms in a 70 year old female.  FINDINGS:  Small hiatal hernia.  DESCRIPTION OF PROCEDURE:  The nature, purpose and risks of the procedure had been discussed with the patient, who provided written consent.  Keeping in mind the patients history of sleep apnea, caution sedation with Versed 3 mg IV was administered following topical pharyngeal anesthesia with Cetacaine spray.  The Olympus small caliber adult video endoscope was passed under direct vision.  The vocal cords were not well seen.  The esophagus was readily entered and was normal in its entirety, without evidence of reflux esophagitis, Barretts esophagus, free reflux, varices, infection, neoplasia or any ring or stricture.  A 1 to 2 cm hiatal hernia was seen.  The stomach contained a small clear residual.  This was suctioned up as much as possible. The gastric mucosa was normal without evidence of gastritis, erosions, ulcers, polyps or masses.  And a retroflex view of the proximal stomach was unremarkable.   The pylorus duodenal bulb and second duodenum looked normal.  The scope was removed from the patient.  No biopsies were obtained.  The patient tolerated the procedure quite well, with a little bit of retching, and just transient desaturation down to 89% with some of the retching.  There were no apparent complications.  IMPRESSION: 1. Essentially normal upper endoscopy.  No adverse sequelae of chronic reflux     identified. 2. Small hiatal hernia present.  PLAN:  Symptomatic management of reflux, currently consisting of Prilosec 20 mg b.i.d. Dictated by:   Florencia Reasons, M.D. Attending Physician:  Doug Sou DD:  09/12/01 TD:  09/12/01 Job: [id number]

## 2010-10-17 NOTE — Discharge Summary (Signed)
[name], [name]                 ACCOUNT NO.:  [id number]   MEDICAL RECORD NO.:  [id number]          PATIENT TYPE:  INP   LOCATION:  0445                         [facility]AC[facility]   PHYSICIAN:  Lenon Curt. Chilton Si, M.D.  DATE OF BIRTH:  1940/12/31   DATE OF ADMISSION:  08/17/2004  DATE OF DISCHARGE:  08/20/2004                                 DISCHARGE SUMMARY   FINAL DIAGNOSES:  1.  Intractable vomiting.  2.  Allergic reaction to cephalexin.  3.  Insulin-dependent diabetes mellitus.  4.  Gout.  5.  Edema.  6.  Morbid obesity.   CONSULTING PHYSICIAN:  Vikki Ports, MD   PROCEDURE:  CT of the abdomen and pelvis.   COMPLICATIONS:  None.   CONDITION ON DISCHARGE:  Improved.   HOSPITAL COURSE:  A 70 year old white female who was brought to the  emergency room by her spouse and admitted for intractable vomiting.  Her  history was complicated.  She had had a recent gastric bypass surgery on  July 22, 2004 by Dr. Luan Pulling and then had to be taken to back to  surgery for a revision due to a small bowel obstruction on July 25, 2004.  She was able to be discharged to home through August 04, 2004 but had  problems after her return to home.  On August 06, 2004 she had episodes of  hypoglycemia, was seen in the emergency room, and again returned home.  The  patient had intermittent vomiting's since that date.  The patient had been  seen by Jeannett Senior A. [place], M.D. approximately one week prior to admission.  He diagnosed a cellulitis of the right foot and the patient was started on  Keflex.  Approximately four days later, the patient developed a diffuse red  rash and generalized body itch.  She was also started on other medications  that she could not name which were for diuretics.   Due to about a 20 pounds weight gain and retained fluids, the patient was  evaluated by her cardiologist, Georga Hacking, M.D.  He increased her  Lasix to 80 mg in the morning and 40 mg in the  evening.  She was also put on  Aldactone.  This was subsequently stopped and metolazone was started.  The  patient was also noted to have a reported allergy to codeine and had been  using hydrocodone 7.5 mg/acetaminophen 500 mg.   Past known diagnoses included insulin-dependent diabetes mellitus, morbid  obesity, coronary artery disease, melanosis coli, cardiomegaly,  hypertension, GERD, depression, fibromyalgia, hyperlipidemia, hepatic  steatosis, asthma, and renal cyst.   Following admission into the hospital, the patient was started on Avelox.  Cephalexin was discontinued.  She was given promethazine intravenously for  nausea, Protonix IV, Reglan IV, hydroxyzine, and Zyrtec for her rash and  itching.  On the day following admission, she continued to have a  significant amount of itching.  Benadryl was added.  Abdominal pains and  discomfort which were present on the day of admission were resolving.  We  asked Dr. Luan Pulling, her surgeon, to see  her.  She followed her while in  the hospital and removed her mattress sutures prior to discharge.   Uric acid levels were done and returned to 15.4, confirming a suspicion that  the right foot swelling and inflammation was probably gout.  The patient was  started on allopurinol and colchicine and improved dramatically over the  next 24 hours.  Her pruritus began to diminish and accumulating fluid also  diminished.  The patient was able to be discharged safely on August 20, 2004  to home.   LABORATORY DATA:  Her admitting CBC showed hemoglobin of 13.1, white count  8100, RDW 15.6, platelet count 496,000.  Followup tests on August 18, 2004 showed a fall in hemoglobin to 11.0 and  other parameters basically the same.  A complete metabolic panel on  admission showed an elevated glucose of 334, BUN 37, creatinine 2.3, sodium  134, potassium 4.5.  On July 21, 2004 potassium had dropped to 3.1,  glucose to 126, BUN 36, creatinine 1.8.  There was  a modest elevation in AST  at 43.  CK and CK-MB and troponin levels on admission were normal.  TSH was  normal at 3.214.  Urinalysis appeared cloudy but the final cultures showed  only 30,000 colonies of multiple species.   EKG showed a sinus tachycardia, low voltage, QRS, and T wave abnormalities  in the inferior leads and anterolateral leads.  Followup EKG on the day  after admission was unchanged.   X-rays on August 17, 2004 of the abdomen and chest showed an unremarkable  bowel gas pattern.  No acute findings and no active cardiopulmonary disease.  Both lungs appeared to be clear.  CT of the abdomen without contrast was  done on August 18, 2004.  It showed mild bowel wall thickening involving the  proximal efferent loops of small bowel.  There was resolution of a dilated  gastric remnant afferent small bowel loops since a previous study done on  July 24, 2004.  CT of the pelvis was unremarkable.   DISCHARGE MEDICATIONS:  1.  Prilosec 40 mg twice daily.  2.  Glucotrol XL 10 mg before breakfast and before supper.  3.  Tums two tablets t.i.d.  4.  B12 250 mg tablet daily.  5.  Lasix 40 mg two tablets in the morning.  6.  Kay Ciel two 10 mEq tablets three times daily.  7.  Celexa 20 mg daily.  8.  Combivent inhaler two puffs four times daily.  9.  Flonase one puff in each nostril daily.  10. Multivitamin one daily.  11. Magic mouthwash 5 cubic centimeters gargle q.i.d. for one week.  12. Allopurinol 100 mg daily to prevent gout.  13. Metoclopramide 10 mg half-tablet before meals and at bedtime.  14. Zyrtec 10 mg daily.  15. NPH insulin 12 units before bed.   PAIN MANAGEMENT:  Not applicable.   ACTIVITY:  Without restrictions.   DIET:  Use gastric bypass diet.   WOUND CARE:  Change bandages daily.  Leave wound tape in place.   SPECIAL INSTRUCTIONS:  Do not resume Actos as we suspect it may have  aggravated fluid retention problems.  FOLLOW UP:  Schedule an appointment to  see Dr. Chilton Si in 3-4 weeks and Dr.  Luan Pulling as directed by her office.      AGG/MEDQ  D:  08/28/2004  T:  08/28/2004  Job:  [id number]   cc:   Vikki Ports, MD  [address]  [place]  [place] [address]   Tera Mater. Evlyn Kanner, M.D.  [address]  [place]  [place] [address]  Fax: [phone]

## 2010-10-17 NOTE — Op Note (Signed)
NAME:  [name], [name]                 ACCOUNT NO.:  [id number]   MEDICAL RECORD NO.:  [id number]          PATIENT TYPE:  AMB   LOCATION:  DAY                          FACILITY:  [facility]   PHYSICIAN:  Alfonse Ras, MD   DATE OF BIRTH:  11/19/1940   DATE OF PROCEDURE:  09/22/2005  DATE OF DISCHARGE:                                 OPERATIVE REPORT   PREOPERATIVE DIAGNOSES:  Status post laparoscopic roux-en-Y gastric bypass  and status post exploratory laparotomy with ventral hernia.   POSTOPERATIVE DIAGNOSES:  Status post laparoscopic roux-en-Y gastric bypass  and status post exploratory laparotomy with ventral hernia.   PROCEDURE:  Laparoscopic ventral hernia repair with mesh.   SURGEON:  Alfonse Ras, MD.   ASSISTANTSharlet Salina T. Hoxworth, MD.   ANESTHESIA:  General.   DESCRIPTION OF PROCEDURE:  The patient was taken to the operating room,  placed in a supine position after adequate general anesthesia was induced. A  Foley catheter was placed and the abdomen was prepped and draped in a normal  sterile fashion. Using a 5 mm trocar with the Optiview technique, peritoneal  access was obtained in the right upper quadrant under direct vision.  Pneumoperitoneum was obtained. Additional 5  mm trocars were placed in the  lower abdomen under direct vision. There was a significant amount of omental  adhesions to the anterior abdominal wall which were taken down with the  harmonic scalpel. There was a significant amount of small bowel which was  also fairly adherent to the anterior abdominal wall. This was taken down  without cautery and with sharp dissection only. This was a very tedious  dissection and it took about 80 minutes. After all contents around the  hernia defect were reduced, a piece of 30 x 20 cm dual sided Parietex mesh  with previously Novofil sutures was placed in the abdomen. These were  brought up to the anterior abdominal wall and tied down. The periphery of  the  mesh was tacked so that it lay flush with the anterior abdominal wall  with a 2 cm overlay in all directions of the hernia defect. Adequate  hemostasis was ensured. There was no evidence of any injury to bowel. We  were satisfied with the repair, pneumoperitoneum was released and the  trocars were removed. The incisions were closed with staples and injected  with Marcaine. The patient tolerated the procedure well and went to PACU in  good condition.      Alfonse Ras, MD  Electronically Signed     KRE/MEDQ  D:  09/22/2005  T:  09/23/2005  Job:  [id number]

## 2010-10-17 NOTE — Op Note (Signed)
[name], [name]                 ACCOUNT NO.:  [id number]   MEDICAL RECORD NO.:  [id number]          PATIENT TYPE:  INP   LOCATION:  1618                         FACILITY[facility]   PHYSICIAN:  Alfonse Ras, MD   DATE OF BIRTH:  1941/01/14   DATE OF PROCEDURE:  03/19/2006  DATE OF DISCHARGE:                                 OPERATIVE REPORT   PREOPERATIVE DIAGNOSIS:  Hiatal hernia and status post laparoscopic Roux-en-  Y gastric bypass, status post laparoscopic ventral hernia repair.   PROCEDURE:  Open ventral hernia repair with mesh.   SURGEON:  Baruch Merl, MD   ASSISTANT:  Thornton Dales, MD   ANESTHESIA:  General.   DESCRIPTION OF PROCEDURE:  The patient was taken to the operating room and  placed in the supine position.  After adequate anesthesia was induced using  endotracheal tube, the abdomen was prepped and draped in normal sterile  fashion.  Using the previous vertical midline incision, I dissected down  through the skin and onto the hernia sac.  Hernia sac was dissected;  however, there was such significant adhesions to the fascia even after a  very tedious dissection, fascial edges were difficult to identify and the  peritoneum could not be entered.  Fascial edges anteriorly were identified,  and flaps were created around.  A piece of onlay Prolene mesh was then  placed over the defect and sewn with interrupted 0 Prolene sutures.  Adequate hemostasis was ensured.  A drain was placed in the subcutaneous  tissue.  The subcutaneous tissue was closed with a running 2-0 Vicryl.  Skin  was closed with staples.  The patient tolerated the procedure well and went  to PACU in good condition.      Alfonse Ras, MD  Electronically Signed     KRE/MEDQ  D:  03/19/2006  T:  03/20/2006  Job:  [id number]

## 2010-10-17 NOTE — Op Note (Signed)
[name], [name]                 ACCOUNT NO.:  [id number]   MEDICAL RECORD NO.:  [id number]          PATIENT TYPE:  INP   LOCATION:  0158                         FACILITY:  [facility]   PHYSICIAN:  Vikki Ports, MDDATE OF BIRTH:  09-Aug-1940   DATE OF PROCEDURE:  DATE OF DISCHARGE:                                 OPERATIVE REPORT   PREOPERATIVE DIAGNOSIS:  Hypotension status post exploratory laparotomy and  jejunojejunostomy revision status post gastric bypass.   PROCEDURE:  Placement of left subclavian triple lumen central venous  catheter.   DESCRIPTION OF PROCEDURE:  The patient in room 158 was mildly sedated. Left  chest was prepped and draped in normal sterile fashion. Using Seldinger  technique, the left subclavian venipuncture was performed, guidewire was  placed, and central lumen catheter was placed in the standard fashion.  Lumens were flushed. It was sutured in place. Chest x-ray is pending.      KRH/MEDQ  D:  07/25/2004  T:  07/25/2004  Job:  [id number]

## 2010-10-17 NOTE — Op Note (Signed)
NAMEGINNI, EICHLER                 ACCOUNT NO.:  [id number]   MEDICAL RECORD NO.:  [id number]          PATIENT TYPE:  INP   LOCATION:  0157                         FACILITY[facility]   PHYSICIAN:  Thornton Park. Daphine Deutscher, MD  DATE OF BIRTH:  [birth year]   DATE OF PROCEDURE:  07/22/2004  DATE OF DISCHARGE:                                 OPERATIVE REPORT   PREOPERATIVE DIAGNOSES:  Morbid obesity.   POSTOPERATIVE DIAGNOSES:  Morbid obesity.   PROCEDURE:  Upper endoscopy.   SURGEON:  Thornton Park. Daphine Deutscher, MD   ANESTHESIA:  General provided during lap roux-Y gastric bypass.   DESCRIPTION OF PROCEDURE:  At the completion of the gastrojejunostomy, I  broke scrub, went to the head of the table and passed the endoscope into the  esophagus without difficulty and then under direct vision watched as the  endoscope went from the esophagus down to the EG junction which was  approximately 40 cm and into the pouch. The pouch appeared to be about 4-5  cm in length.  Insufflation was performed which demonstrated a leak and the  endoscope was left in place and used intermittently to test for leaks for a  while.  I went ahead and scrubbed back in and the scope was then withdrawn  later by Dr. Ovidio Kin.      MBM/MEDQ  D:  07/29/2004  T:  07/29/2004  Job:  [id number]

## 2010-10-17 NOTE — Op Note (Signed)
[name], [name]                 ACCOUNT NO.:  [id number]   MEDICAL RECORD NO.:  [id number]          PATIENT TYPE:  INP   LOCATION:  0158                         FACILITY:  [facility]   PHYSICIAN:  Vikki Ports, MDDATE OF BIRTH:  [birth year]   DATE OF PROCEDURE:  07/25/2004  DATE OF DISCHARGE:                                 OPERATIVE REPORT   PREOPERATIVE DIAGNOSES:  Two days status post gastric bypass now with  apparent jejunojejunostomy obstruction.   POSTOPERATIVE DIAGNOSES:  Two days status post gastric bypass now with  apparent jejunojejunostomy obstruction.   PROCEDURE:  Laparoscopy, open laparotomy, resection of jejunojejunostomy  with complete revision of roux limb and biliary limbs.   ANESTHESIA:  General.   SURGEON:  Vikki Ports, MD   ASSISTANT:  Thornton Park. Daphine Deutscher, MD   ESTIMATED BLOOD LOSS:  Minimal.   DESCRIPTION OF PROCEDURE:  The patient was taken to the operating room,  placed in a supine position and after adequate general anesthesia was  induced using the endotracheal tube, the abdomen was prepped and draped in  the normal sterile fashion.  Using a 12 mm Optiview trocar in the right  lower quadrant, I obtained pneumoperitoneum.  However, because of the  massive dilated small bowel, visualization was not possible along with the  patient's body habitus.  At that point, I opted to convert to an open  procedure.   A vertical midline incision was made, extended down below the umbilicus. The  peritoneum was entered after the fascia was incised. The dilated bowel was  mobilized until the jejunojejunostomy could be identified. There was some  edema in this region but no evidence of leak or kink. Manipulation of the  dilated roux limb and biliary limbs. There was an apparent ischemic  intraoperative weakness with subsequent leak at the apex of the biliary  jejunal-jejunal anastomosis. Because of this reason, I opted to resect the  jejunojejunostomy.   This was performed in the standard fashion using several  firings of the GIA-75 stapling device. The mesentery was taken down between  [facility] clamps. Aligning the distal end of the roux limb to the decompressed  distal common channel, a side to side jejunojejunostomy was performed with a  75 mm GIA stapling device and the defect was closed with a TA-60. The crotch  was reinforced with a 2-0 silk suture. The mesentery was closed using 2-0  silk sutures. An area approximately 15 cm distal to this anastomosis was  identified. The newly revised biliary limb was then placed in a side to side  fashion and a side to side jejunojejunostomy was performed in a standard  fashion using a 75 mm GIA stapling device.  With concern of narrowing in the  common channel, I opted to close this with a hand sewn 3-0 Prolene running  canal technique. It was imbricated loosely with 3-0 silk sutures and the  mesentery was closed with interrupted 2-0 silk sutures. The abdomen was  copiously irrigated. The proximal small bowel then decompressed at the times  of enterotomy. The NG tube could be felt just  at the EG junction and was  left in place.  Omentum was  replaced back to its normal anatomic position, Tisseel was placed on both  anastomoses. The fascia was closed with running #1 Novofil and #5 Ethibond  retention sutures were placed.  A sterile dressing was applied. The patient  tolerated the procedure well and went to PACU in good condition.      KRH/MEDQ  D:  07/25/2004  T:  07/25/2004  Job:  [id number]

## 2010-10-17 NOTE — Discharge Summary (Signed)
[place]  Patient:    AVALEY, COOP                        MRN: [id number] Adm. Date:  [id number] Disc. Date: [id number] Attending:  Milly Jakob Dictator:   Currie Paris. Orma Flaming, P.A.-C. CC:         Lenon Curt. Chilton Si, M.D.   Discharge Summary  ADMITTING DIAGNOSES: 1. Displaced trimalleolar fracture right ankle. 2. Insulin-dependent diabetes mellitus. 3. Obesity. 4. Hypertension. 5. Fibromyalgia. 6. Gastroesophageal reflux disease. 7. Asthma. 8. Depression.  DISCHARGE DIAGNOSES: 1. Displaced trimalleolar fracture right ankle. 2. Insulin-dependent diabetes mellitus. 3. Obesity. 4. Hypertension. 5. Fibromyalgia. 6. Gastroesophageal reflux disease. 7. Asthma. 8. Depression.  HOSPITAL PROCEDURES:  Open reduction, internal fixation right ankle, May 23, 2000.  CONSULTATIONS:  Internal Medicine, Lenon Curt. Chilton Si, M.D.  BRIEF HISTORY:  Ms. Mars is a 70 year old female who on the day of admission fell down some stairs and twisted her right ankle, had pain and swelling in her right ankle and foot with some mild numbness and tingling and was unable to weightbear on it without pain.  She came to the [facility] Emergency Room, where x-rays of the right ankle revealed a displaced trimalleolar fracture of the right ankle.  She was admitted for treatment of the significant ankle injury and will be brought to the operating room for open reduction, internal fixation.  PERTINENT LABORATORY STUDIES:  An EKG on admission showed normal sinus rhythm with nonspecific T wave abnormalities.  A C-arm fluoroscopy imaging showed satisfactory appearance of the ankle after open reduction, internal fixation. X-rays of the right knee showed no evidence of fracture, dislocation, or other significant bony abnormalities.  No evidence of joint effusion.  X-rays of the right ankle showed a trimalleolar fracture dislocation.  Postreduction x-ray of the  right ankle taken preoperatively showed slight widening of the medial mortis with reduction of the trimalleolar fracture.  Preoperative chest x-ray showed mild cardiomegaly, no active disease.  Chest x-ray on May 24, 2000, showed stable chest with mild cardiomegaly.  CBC on admission showed a hemoglobin of 10.9, hematocrit 31.2, WBC 6.9.  On postoperative day #1, her hemoglobin was 9.7, hematocrit 29.7, WBC 9.4.  These were within normal limits.  Protime on admission was 12.2 seconds, INR .9, PTT 24.  On the day of discharge her protime was 16.3 seconds with an INR of 1.5 on Coumadin antithrombotic therapy.  CMET on admission showed a glucose of 163, BUN 36, otherwise within normal limits.  BMET on postop day #1 showed glucose of 136, BUN 30.  CMET on May 26, 2000, showed a glucose of 129, total albumin 3.0,bun 15, ALT 38.  Urinalysis on admission showed no significant abnormalities.  HOSPITAL COURSE:  The patient was admitted through the emergency room by Dr. Luiz Blare with a significant right ankle fracture dislocation.  In the emergency department, under conscious sedation, Dr. Luiz Blare did a closed manipulation of the right ankle fracture and placed her in a posterior splint.  On May 23, 2000, the patient was brought to the operating room where she underwent ORIF of her right ankle and is well described in Dr. Philipp Ovens operative note.  A medical consult was obtained by Dr. Murray Hodgkins, as the patient had multiple medical problems including diabetes.  Dr. Chilton Si managed her diabetes very carefully, and this was greatly appreciated.  Postoperatively the patient was placed on Coumadin antithrombotic therapy per [facility]  Pharmacy protocol, as she was felt to be at risk for DVT with her decreased activity postoperatively and her size.  Postop day one, the patient was found by nursing in her room after she had vomited.  She was pale and had O2 saturations of about 50%.  Her blood  pressure was down to 70/50, and her pulse was 113.  Dr. Chilton Si was notified.  When she was seen by [place] 30 to 60 minutes later, the patient was feeling much better.  Her vital signs had improved to 99/48 and a pulse of 96.  Her O2 saturations were 99% on 2 liters. Oxygen was started by nursing as soon as the patient was found.  Her hemoglobin was stable at 9.7.  Her potassium was 3.6.  BUN was 30.  Blood pressure medications were all held.  Kept at bed rest.  She was evaluated by the medicine service and was felt to be stable.  It was thought it may be secondary to morphine or her blood pressure medications.  The patient progressed much better and was able to resume physical therapy the following day.  She did have some hypoglycemia, and her hypertension medications were continued to be held.  The patient made good progress thereafter and was continued on Coumadin antithrombotic therapy.  She was discharged home on postop day #3, May 26, 2000.  She was taking fluids and voiding without difficulty.  Her temperature was 99.4.  Her vital signs were stable.  Her hemoglobin was 9.6 with hematocrit 28.1.  She was discharged home after being seen by physical therapy.  She was given a prescription for Vicodin p.r.n. for pain and instructed to ambulate nonweightbearing on the right with a walker. She was in improved condition on a regular diabetic diet.  She will be on Coumadin per pharmacy protocol x 1 month postop.  She will need a wheelchair with an elevating leg attachment.  FOLLOW-UP:  She will follow-up with Dr. Luiz Blare in two weeks, follow-up with Dr. Chilton Si within one month. DD:  07/19/00 TD:  07/20/00 Job: [id number]

## 2010-10-17 NOTE — Op Note (Signed)
[place]. [facility]  Patient:    [name], [name]                        MRN: [id number] Proc. Date: 05/23/00 Adm. Date:  [id number] Attending:  Milly Jakob CC:         Tera Mater. Evlyn Kanner, M.D.  Lenon Curt Chilton Si, M.D.   Operative Report  PREOPERATIVE DIAGNOSIS:  Fracture-dislocation, right ankle, trimalleolar.  POSTOPERATIVE DIAGNOSIS:  Fracture-dislocation, right ankle, trimalleolar.  PROCEDURE:  Open reduction and internal fixation of right trimalleolar ankle fracture-dislocation with fixation of the medial malleolus with two 4.0 mm cannulated screws, Ace.  The lateral fixation was with a one-third tubular plate and screws, and the anterior-posterior fixation with 4.0 cannulated screw.  SURGEON:  Harvie Junior, M.D.  ASSISTANT:  Currie Paris. Thedore Mins.  ANESTHESIA:  General.  BRIEF HISTORY:  A 70 year old female who was standing at the top of the stairs, slipped and fell.  She was seen in the emergency room with a bad fracture-dislocation of the ankle.  The skin was tented.  The blood supply was poor.  Her sensation was bad.  We did a manipulative closed reduction in the emergency room and got her dislocation reduced.  The skin pinked up, the sensation improved, and we put her into a bulky dressing.  At that point the operating room was not available for about five hours because of cases being done, and it was going to be 2 in the morning, and I felt that at that point ice and elevation would be appropriate and fix her at 7:30 the following morning when there was time available, and she was admitted for that procedure and then brought to the operating room for her surgery.  DESCRIPTION OF PROCEDURE:  Patient brought to the operating room and after adequate anesthesia was obtained with general anesthetic, the patient was placed supine on the operating table.  The right leg was prepped and draped in the usual sterile fashion.  Following Esmarch  exsanguination of the extremity, a blood pressure tourniquet inflated to 350 mmHg.  Following this, the lateral incision was made, subcutaneous tissue reflected down to the level of the ankle fracture laterally.  It was identified and reduced.  The attention was then turned to the medial side, where incision was made.  The talus was inspected and noted to have a little bit of tip fracture medially, was copiously irrigated and suctioned dry.  The medial malleolus was then anatomically reduced and fixed with guidewires and then 4.0 mm cancellous cannulated screws.  Following that, attention was turned to the lateral side, where an interfragmentary screw was placed, and then a five-hole one-third tubular plate was attached to the bone.  Anatomic fixation was achieved at this point.  Following this, attention was turned to the posterior malleolus, which was anatomically reduced at this point; however, given that it was 30% of the articular surface, it was felt that it needed to be fixed, and it was fixed with a single front-to-back 4.5 mm cannulated screw.  Excellent fixation was achieved.  The patient then had her wounds copiously irrigated and suctioned dry, and they were closed in layers and a _____ posterior splint were applied after a sterile compressive dressing, and the patient was taken to the recovery room, where she was noted to be in satisfactory condition. Estimated blood loss for the procedure was none. DD:  05/23/00 TD:  05/24/00 Job: [id number]

## 2010-10-17 NOTE — Discharge Summary (Signed)
[name], [name]                 ACCOUNT NO.:  [id number]   MEDICAL RECORD NO.:  [id number]          PATIENT TYPE:  INP   LOCATION:  1603                         FACILITY:  [facility]   PHYSICIAN:  Alfonse Ras, MD   DATE OF BIRTH:  02-18-1941   DATE OF ADMISSION:  09/22/2005  DATE OF DISCHARGE:  09/25/2005                                 DISCHARGE SUMMARY   ADMISSION DIAGNOSIS:  Status post laparoscopic gastric bypass and open  revision and now with ventral hernia.   DISCHARGE DIAGNOSIS:  Status post laparoscopic gastric bypass and open  revision and now with ventral hernia.   CONDITION ON DISCHARGE:  Good and improved.   DISPOSITION:  Discharged to home.   FOLLOWUP:  Is with me in one week.   HOSPITAL COURSE:  The patient was admitted, underwent laparoscopic ventral  hernia repair with mesh.  Postoperatively, she was observed.  On  postoperative day #1, the patient had significant incisional pain.  By  postoperative day #2, the pain had improved but the patient was still  complaining of weakness.  She was ambulated and maintained on IV fluids and  by postoperative day #3, she was feeling much better and was ready for  discharge home.   FINAL DIAGNOSIS:  Ventral hernia.      Alfonse Ras, MD  Electronically Signed     KRE/MEDQ  D:  09/25/2005  T:  09/25/2005  Job:  [id number]

## 2010-10-17 NOTE — Consult Note (Signed)
NAMEWENDY, [name]                 ACCOUNT NO.:  [id number]   MEDICAL RECORD NO.:  [id number]          PATIENT TYPE:  INP   LOCATION:  0151                         FACILITY:  [facility]   PHYSICIAN:  Leslye Peer, MD    DATE OF BIRTH:  [birth year]   DATE OF CONSULTATION:  03/21/2006  DATE OF DISCHARGE:                                   CONSULTATION   REASON FOR CONSULTATION:  We were asked by Dr. Baruch Merl with general  surgery to evaluate [name] for hypotension and oliguric acute renal  insufficiency.   BRIEF HISTORY:  [name] is a 70 year old woman with a history of obesity,  status post gastric bypass surgery approximately 18 months ago.  She also  has a history of diabetes mellitus, hypertension, obstructive sleep apnea  which is no longer treated after weight loss from her surgery.  Her prior  hospitalizations have been complicated by episodes of pruritic erythematous  macular rash associated with edema and hypotension that have been thought to  be consistent with an anaphylactic response.  No definitive culprit  medication has been identified related to these episodes, although she has  known sensitivities and probable rash with MORPHINE and DILAUDID.  She was  admitted on 03/19/2006 for mesh placement and repair of a ventral hernia,  which was done by Dr. Colin Benton without complication.  She has done well until  last night, when she developed recurrent rash and hypotension into the  60s/40s.  She has also had decreasing urine output.  For the last several  hours, her urine output has been approximately 10 mL/hour or less.  Her  medications on this hospitalization have included a Dilaudid PCA which was  stopped last night.  Her home medications have also been restarted, as  detailed below.  These include Cozaar and Lasix.  Her serum creatinine was  1.2 on 03/16/2006 and has increased to 3.1 this morning.  She has received 3  liters of IV fluids since yesterday afternoon, and  her Cozaar and Lasix have  been stopped.  She now moves to the ICU for further care.   PAST MEDICAL HISTORY:  Obesity.  Gastric bypass surgery 18 months ago.  Insulin-dependent diabetes mellitus.  Mild coronary artery disease.  Hypertension.  GERD.  Fibromyalgia.  Depression.  Hyperlipidemia.  Hepatic steatosis.  Status post trimalleolar fracture in 05/2001.  Status post gastric bypass surgery with laparoscopic Roux-en-Y in 07/2004.  Status post laparoscopic resection of jejunostomy, with revision of Roux-en-  Y due to small bowel obstruction in 07/2004.  History of pulmonary nodules.   ALLERGIES:  The patient has multiple allergies indicated in her chart, which  include KEFLEX, CODEINE, DILAUDID, MORPHINE, DARVOCET, ALBUTEROL,  CIPROFLOXACIN, ADVAIR, AND SEREVENT.   CURRENT MEDICATIONS:  1. Heparin 5000 units q.8 h.  2. Tylenol p.r.n.  3. Sliding scale insulin.  4. Glipizide XL 10 mg b.i.d.  5. Protonix 80 mg b.i.d.  6. Celexa 40 mg daily.  7. Lipitor 20 mg daily.  8. TriCor 145 mg daily.  9. Cozaar 50 mg daily, which was just stopped.  10.HCTZ 12.5 mg daily, which was just stopped.  11.Claritin 10 mg daily.  12.Vitamin B12 125 mcg daily.  13.Multivitamin 1 b.i.d.  14.Calcium carbonate 500 mg b.i.d.  15.Vagifem 25 mcg q.7 days.  16.Vitamin D3 q.7 days.  17.Phenergan 12.5 mg IV p.r.n.  18.Vicodin 5/500 mg q.4 h. p.r.n.  19.Benadryl p.r.n.  20.Zofran p.r.n.   SOCIAL HISTORY:  The patient has been married twice.  She does not use  alcohol or tobacco.   FAMILY HISTORY:  Noncontributory.   PHYSICAL EXAMINATION:  GENERAL:  This is a pleasant overweight woman who is  in no distress on room air.  She interacts appropriately.  She is awake and  alert, despite her hypotension.  VITAL SIGNS:  Temperature 98.6, blood  pressure 92/50, heart rate 88, SPO2 99% on 2 liters, respiratory rate is 14  and comfortable.   HEENT:  The oropharynx is clear.  There is no stridor or  thrush.   SKIN:  Has a diffuse blanching urticarial erythematous rash, without any  bullous findings.   LUNGS:  Clear to auscultation bilaterally.   HEART:  Has a regular rate and rhythm, without murmur.   ABDOMEN:  Obese, soft, nontender, with positive bowel sounds.  Her dressing  is clean, dry, and intact.   EXTREMITIES:  She has no lower extremity edema.  Her SCDs are in place.  She  feels that her upper extremities are becoming somewhat swollen, but I do not  palpate any pitting edema.   NEUROLOGIC:  She is alert and awake.  Has good strength, and interacts  appropriately.   LABORATORIES:  Sodium 133, potassium 4.1, chloride 103, CO2 23, BUN 26;  creatinine 3.1, which is up from 1.2 on 03/16/2006, glucose 191.  White  blood cell count 4.8, hematocrit 28, platelets 515.  Chest x-ray was done in  08/2005 which showed no acute disease.  She has not had a chest x-ray on  this admission.   IMPRESSION:  1. Shock, with apparent anaphylactic response, with rash, facial and arm      edema.  The inciting medication is not clear, but I suspect that her      narcotics PCA was the problem, given her prior history of sensitivity      to narcotics.  A systemic inflammatory process such as mastocytosis is      possible, and this can be triggered by stress, including surgery, but      this would be a rare occurrence.  Will continue her Benadryl and      initiate a short course of corticosteroids.  I will place a central      venous catheter to assess her intravascular volume status and to guide      volume replacement.  We will obtain pancultures but will not start      antibiotics at this time, in the absence of any focus of infection.  We      will hold her antihypertensive medications and her narcotics and check      a cardiac panel to ensure she has not had an occult coronary event. 2. Acute renal failure, with oliguria.  This is almost certainly secondary      to her poor perfusion  state and ATN.  I will check urine electrolytes,      although these will be less useful since she has had Lasix in the last      24 hours.  We will replace volume per her CVP, as mentioned  above.  3. Status post ventral hernia repair following gastric bypass surgery 18      months ago.  4. Obesity.  5. Obstructive sleep apnea.  No longer on CPAP after weight loss from her      gastric bypass.           ______________________________  Leslye Peer, MD     RSB/MEDQ  D:  03/21/2006  T:  03/22/2006  Job:  [id number]

## 2010-10-17 NOTE — H&P (Signed)
[name], NUSZ                 ACCOUNT NO.:  [id number]   MEDICAL RECORD NO.:  [id number]          PATIENT TYPE:  INP   LOCATION:  0151                         FACILITY:  [facility]   PHYSICIAN:  Alfonse Ras, MD   DATE OF BIRTH:  [birth year]   DATE OF ADMISSION:  03/19/2006  DATE OF DISCHARGE:  03/24/2006                              HISTORY & PHYSICAL   ADMISSION DIAGNOSIS:  Ventral hernia.   HISTORY OF PRESENT ILLNESS:  Patient is status post laparoscopic Roux-en-  Y gastric bypass converted to open in the postoperative period secondary  to small bowel obstruction.  She presented to my office with a  symptomatic ventral hernia.  She was admitted for ventral hernia repair  with mesh.   PAST MEDICAL HISTORY:  Significant for obesity, gastric bypass surgery,  insulin-dependent diabetes mellitus, hypertension, gastroesophageal  reflux disease, fibromyalgia, depression, hyperlipidemia,  steatohepatitis.   Medications included heparin 5000 units at admission, sliding-scale  insulin, glipizide XL 10 mg b.i.d., Protonix 40 mg b.i.d., Celexa 40 mg  a day, Lipitor 20 mg a day, Cozaar 50 mg a day, vitamin B12,  multivitamins, and p.r.n. Vicodin and Phenergan.   SOCIAL HISTORY:  The patient is married to her second husband and does  not use tobacco or alcohol.   PHYSICAL EXAMINATION:  GENERAL:  She is an age-appropriate white female  in no distress.  HEENT:  Benign.  Normocephalic and atraumatic.  Pupils are equal, round  and reactive to light.  LUNGS:  Clear to auscultation and percussion x2.  HEART:  Regular rate and rhythm without murmurs, rubs or gallops.  ABDOMEN:  Somewhat obese, soft, with an easily reducible ventral hernia.  EXTREMITIES:  No clubbing, cyanosis or edema.   IMPRESSION:  Symptomatic ventral hernia.   PLAN:  Ventral hernia repair with mesh.      Alfonse Ras, MD  Electronically Signed     KRE/MEDQ  D:  06/08/2006  T:  06/08/2006  Job:  [id number]

## 2010-10-17 NOTE — Procedures (Signed)
[place]  Patient:    [name], [name] Visit Number: [id number] MRN: [id number]          Service Type: EMS Location: [facility] [place] Attending Physician:  Doug Sou Dictated by:   Florencia Reasons, M.D. Proc. Date: 09/12/01 Admit Date:  08/09/2001 Discharge Date: 08/09/2001   CC:         Jeannett Senior A. Evlyn Kanner, M.D.   Procedure Report  PROCEDURE PERFORMED:  Colonoscopy.  ENDOSCOPIST:  Florencia Reasons, M.D.  INDICATIONS FOR PROCEDURE:  Recent rectal bleeding in a 70 year old female.  FINDINGS:  Normal exam to the terminal ileum.  DESCRIPTION OF PROCEDURE:  The nature, purpose and risks of the procedure had been discussed with the patient, who provided written consent.  Sedation for this procedure and the upper endoscopy which preceded it totaled fentanyl 25 mcg and Versed 3 mg IV, given cautiously in view of the history of sleep apnea.  There was no problem with significant desaturation during this procedure.  The Olympus adjustable tension pediatric video colonoscope was advanced quite easily to the cecum, using some external abdominal compression to control looping.  The tip of the scope was nubbed into the orifice of the ileocecal valve and I was able to see the last several centimeters of the terminal ileum which had a normal appearance.  Pull-back was then performed.  The quality of the prep was very good and it is felt that all areas were well seen.  This was a normal examination except for some melanosis coli in the proximal colon.  No polyps, cancer, colitis, vascular malformations or diverticulosis were noted. Retroflexion in the rectum was normal as was reinspection of the rectosigmoid.  The patient tolerated the procedure well and there were no apparent complications.  IMPRESSION:  Melanosis coli, otherwise normal exam.  Specifically, no polyps or cancer identified.  PLAN: Consider follow-up sigmoidoscopic evaluation in about five  years. Dictated by:   Florencia Reasons, M.D. Attending Physician:  Doug Sou DD:  09/12/01 TD:  09/12/01 Job: [id number]

## 2010-10-17 NOTE — H&P (Signed)
[name], [name]                 ACCOUNT NO.:  [id number]   MEDICAL RECORD NO.:  [id number]          PATIENT TYPE:  INP   LOCATION:  0445                         FACILITY:  [facility]   PHYSICIAN:  Lenon Curt. Chilton Si, M.D.  DATE OF BIRTH:  1941-05-26   DATE OF ADMISSION:  08/17/2004  DATE OF DISCHARGE:                                HISTORY & PHYSICAL   CHIEF COMPLAINT:  Persistent vomiting.   HISTORY OF PRESENT ILLNESS:  A 70 year old white female was brought to the  emergency room by her spouse and is now admitted for intractable vomiting  and other medical problems.  Her history is quite complicated.  The patient  had gastric bypass surgery July 22, 2004 by Dr. Luan Pulling.  She had to  be taken back for repeat surgery for revision due to small-bowel obstruction  July 25, 2004.  The patient was ultimately discharged August 01, 2004.  She has had several problems since return to home.  On August 06, 2004, she  had an episode of hypoglycemia and following transport to the emergency room  and being given IV glucose, she was ultimately returned to home.  The  patient has had some intermittent vomiting since then.   She was seen by Dr. Adrian Prince approximately one week ago.  He diagnosed  a cellulitis of the right foot and started her on Keflex.  About four days  later, she developed a diffuse red rash and generalized both itch.  She was  also started on other medications that she cannot name at this time.  They  were diuretics.   Due to a 20 pound weight gain and retained fluid, she was evaluated by Dr.  Donnie Aho, cardiologist who increased her Lasix to 80 mg in the morning and 40  mg in the evening.  She was also put on another medication, possibly  Aldactone which was subsequently stopped by Dr. Evlyn Kanner who then started  Metolazone.  The patient has a reported allergy to codeine and has been  using hydrocodone 7.5 mg with acetaminophen 500 mg tablets.   PAST HISTORY:  1.   Insulin-dependent diabetes mellitus.  2.  Morbid obesity.  3.  Mild coronary artery disease.  4.  Melanosis coli.  5.  Cardiomegaly.  6.  Hypertension.  7.  Gastroesophageal reflux disease.  8.  Depression.  9.  Fibromyalgia.  10. Hyperlipidemia.  11. Hepatic steatosis.  12. Asthma.  13. Renal cysts.   SURGERY:  1.  In December of 2002, open reduction internal fixation of trimalleolar      fracture of the right ankle by Dr. Luiz Blare.  2.  On July 22, 2004, gastric bypass with laparoscopic Roux-en-Y.  3.  On July 25, 2004, laparoscopy and resection of jejunostomy with      revision of a Roux-en-Y due to small-bowel obstruction.   CONSULTANTS:  1.  Dr. Donnie Aho, cardiology.  2.  Dr. Matthias Hughs, gastroenterology.  3.  Dr. Adrian Prince, endocrinology.  4.  Dr. Luan Pulling, general surgery.  5.  Dr. Luiz Blare, orthopedics.   PROCEDURES:  1.  In April of 2003,  EGD, (Buccini):  Hiatal hernia.  2.  In April of 2003, colonoscopy, (Buccini):  Melanosis coli.  3.  In January of 2004, heart catheterization, Donnie Aho):  Normal left      ventricular hypertrophy, mild coronary artery disease of the proximal      left anterior descending with no focal stenoses.  4.  In January of 2004, a 2D echocardiogram:  Left ventricular ejection      fraction of 55-65%, mild dilation of left atrium.  5.  In July of 2002, CT scan of the abdomen:  Tiny cyst in the liver.  6.  In March of 2004, CT scan of the chest:  Multiple small pulmonary      nodules.  7.  In December of 2005, ultrasound of the abdomen:  Hepatic steatosis and      renal cysts.  8.  On July 24, 2004, CT scan of the abdomen and pelvis:  Small-bowel      obstruction.   ALLERGIES:  Reported to codeine and suspected to Keflex.   MEDICATIONS:  1.  Tylenol p.r.n.  2.  Prilosec 40 mg b.i.d.  3.  Glucotrol XL 10 mg b.i.d.  The patient says she has been unable to      retain this sometimes.  4.  NPH insulin, currently off.  5.  NPH  insulin R, variable dose.  6.  Tums two t.i.d.  7.  B12 oral tablets, one daily, dose uncertain.  8.  Ferrous sulfate one daily.  9.  Actos, currently off.  10. Lasix 40 mg tablets, two in the morning and one in the evening.  11. Kay Ciel 10 mEq, up to eight tablets daily.  12. Celexa 20 mg, currently off.  13. Combivent inhaler, currently off.  14. Flonase one puff each nostril up to twice daily as needed.  15. Multivitamin b.i.d.   FAMILY HISTORY:  Noncontributory.   SOCIAL HISTORY:  Married twice, continues to be married to her second  husband.  No alcohol or tobacco use.  She is full code.  Her husband is  Kairah Leoni at [phone].   REVIEW OF SYSTEMS:  GENERAL:  Acutely and chronically ill.  SKIN:  Generalized pruritus and rash noted.  No ecchymoses.  Has superficial  phlebitis of a vein at the dorsum of the right hand due to IV's following  her surgery.  HEENT:  Eyes:  Wears prescription lenses.  Has some burning of  the mouth.  Denies any problems with hearing.  CHEST:  No complaints of  dyspnea, wheeze or cough recently.  Does have a prior history of asthma and  asthmatic bronchitis.  HEART:  No palpitations.  Some central chest pains  prior to coming to the emergency room.  This followed some vomiting  episodes.  GASTROINTESTINAL:  Vomiting repeatedly.  No diarrhea.  No  hematemesis or melena.  GENITOURINARY:  No complaints.  Denies dysuria.  MUSCULOSKELETAL:  Generalized aching without focal joint pains other than at  the distal foot across the metatarsals.  EXTREMITIES:  Right foot red,  swollen and tender.  NEUROLOGICAL:  Cranial nerves intact.  No tremor.  Deep  tendon reflexes +2 and symmetric.  Speech clear.  No ataxia.  CIRCULATION:  No complaints.  ENDOCRINE:  History of insulin-dependent diabetes mellitus.  No thyroid problems known.   PHYSICAL EXAMINATION:  VITAL SIGNS:  Temperature 97.2, pulse 111-125, blood  pressure 128/70, respirations 16. GENERAL:  Morbidly  obese, alert and lucid.  SKIN:  Generalized erythema  from head down through her thighs.  No petechia  or ecchymoses.  Superficial phlebitic area of the dorsum of the right hand  is noted.  The right foot is very red and has some petechial areas at the  edges of a 2 x 3 tender, erythematous area on the dorsum of the foot.  NODES:  No palpable cervical, axillary, inguinal or other areas.  HEENT:  Head, eyes, ears, nose and throat:  Without conjunctival effusion.  Sclerae white.  Pupils equal, round, reactive.  Hearing grossly normal.  Tympanic membranes normal.  Mouth is beefy red.  There are no yeast plaques  noted.  NECK:  Supple.  No thyromegaly, nodule, mass, bruit or adenopathy.  NODES:  No palpable cervical, axillary, inguinal or other areas.  CHEST:  Without wheeze or rales, appears comfortable on room air.  HEART:  Sinus tachycardia.  No gallop, murmur, click or rub.  ABDOMEN:  Recent midline incision healing without signs of infection.  Steri-  Strips are still in place.  There is some edema of the lower abdomen.  GENITALIA:  Normal external genitalia.  RECTAL:  Normal sphincter tone.  Heme-negative stool.  EXTREMITIES:  A 2+ edema of the legs and feet.  PULSES:  A 2+ dorsalis pedis and posterior tibial.  NEUROLOGICAL:  Grossly normal with cranial nerves intact.  No ataxia.  Speech clear.  Deep tendon reflexes +2 and symmetric.   LABORATORIES:  CBC showed white count 8100, hemoglobin 13.1, platelets  496,000.  Urinalysis:  Cloudy, otherwise normal.  Complete metabolic panel  normal except for glucose 334, BUN 37, creatinine 2.3.  Previously low  potassium at 2.5 on August 06, 2004 is now normal at 4.5 mEq/liter.  Myoglobin 194, CK MB less than 1 and troponin less than 0.05.  EKG:  Sinus  tachycardia at a rate of 120, T changes inferiorly and anterolaterally.  Acute abdominal series:  Scattered air-fluid loops.  CT scan abdomen:  Resolution of small bowel dilated loops; no abscess,  free air or  obstruction.   PROBLEMS:  1.  Intractable vomiting.  2.  Insulin-dependent diabetes mellitus with hyperglycemia.  3.  Allergic drug reaction.  4.  Possible cellulitis of the right foot.  5.  Edema and fluid retention.  6.  Morbid obesity.  7.  Recent gastric bypass.   PLAN:  The patient is admitted for IV support, control of vomiting and  monitoring of her glucose.  She will receive IV antibiotics and diuretics.  We will advise surgery after admission.      AGG/MEDQ  D:  08/18/2004  T:  08/18/2004  Job:  [id number]   cc:   Vikki Ports, MD  [address], [address]  [place]  [place] [address]   Tera Mater. Evlyn Kanner, M.D.  [address]  [place]  [place] [address]  Fax: [phone]

## 2010-10-17 NOTE — Op Note (Signed)
[name], [name]                 ACCOUNT NO.:  [id number]   MEDICAL RECORD NO.:  [id number]          PATIENT TYPE:  INP   LOCATION:  0005                         FACILITY[facility]  [facility]   PHYSICIAN:  Vikki Ports, MDDATE OF BIRTH:  01/23/1941   DATE OF PROCEDURE:  07/22/2004  DATE OF DISCHARGE:                                 OPERATIVE REPORT   PREOPERATIVE DIAGNOSIS:  Morbid obesity.   POSTOPERATIVE DIAGNOSIS:  Morbid obesity.   PROCEDURE:  Laparoscopic Roux-en-Y gastric bypass, antecolic, antegastric,  left-facing Roux limb.   SURGEON:  Danna Hefty, MD   ASSISTANT:  Thornton Park. Daphine Deutscher, MD   ANESTHESIA:  General.   DESCRIPTION OF PROCEDURE:  The patient was taken to the operating room and  placed in the supine position.  After adequate general anesthesia was  induced using endotracheal tube, the abdomen was prepped and draped in the  normal sterile fashion.  Using a 12 mm OptiView trocar in the left upper  quadrant, pneumoperitoneum was obtained under direct visualization.  Additional 12 mm and 5 mm trocars were placed in the abdomen.  The omentum  was very thick but was retracted superiorly.  After ligament of Treitz was  identified, the small bowel was transected using a GIA stapling device.  The  distal limb was marked with a Penrose drain and counting an additional 100  cm distal, a side-to-side jejunojejunostomy was performed in the standard  fashion with a Roux limb to the patient's right.  The defect was closed with  a running 2-0 Vicryl suture.  Mesenteric defect was closed with a running 2-  0 silk suture.  The anastomosis was inspected and reinforced with Tisseal.   Then turned my attention to the upper abdomen.  [name] liver retractor  was placed in the left lateral segment which was very thick and dense, was  retracted anteriorly.  This did make visualization of the angle of His quite  difficult.  Using sharp and blunt dissection, the angle of His  was  dissected.  An area 4 cm from the EG junction on the lesser curve was  identified.  The lesser sac was entered and with serial firings of the 60 mm  GIA blue load stapling device, the pouch was created over an [name].  The  Roux-limb was brought up then in a left-facing orientation.  Then a side-to-  side gastrojejunostomy was performed in two layers with a running posterior  2-0 Vicryl serosal layer.  A 45 mm blue load stapling device was used to the  create the anastomosis.  There was some bleeding from the staple line, and  this was controlled with clips and sutures.  The defect was then closed with  running 2-0 Vicryl suture, and the anterior serosal layer was closed.  The  Roux limb was then clamped, and upper endoscopy was performed which showed a  leak in the anterior portion of the anastomosis.  This was oversewed with a  2-0 Vicryl suture.  An area then in the posterior wall of the Roux limb  where mucosa was evidently extruding, where there  had been a full-thickness  enterotomy, was closed with an interrupted 2-0 Vicryl suture as well.  Upper  endoscopy at this point showed no evidence of leak and a patent anastomosis.  The drain was placed near the gastrojejunostomy.  Adequate hemostasis was  assured, and the skin was closed with staples.  The patient tolerated the  procedure well and went to PACU in stable condition.      KRH/MEDQ  D:  07/22/2004  T:  07/22/2004  Job:  [id number]

## 2010-10-17 NOTE — Discharge Summary (Signed)
[name], [name]                 ACCOUNT NO.:  [id number]   MEDICAL RECORD NO.:  [id number]          PATIENT TYPE:  INP   LOCATION:  0473                         FACILITY:  [facility]   PHYSICIAN:  Vikki Ports, MDDATE OF BIRTH:  Sep 04, 1940   DATE OF ADMISSION:  07/22/2004  DATE OF DISCHARGE:  08/01/2004                                 DISCHARGE SUMMARY   ADMISSION DIAGNOSES:  1.  Morbid obesity.  2.  Insulin-dependent diabetes mellitus.  3.  Hypertension.  4.  Hypercholesterolemia.  5.  Obstructive sleep apnea.  6.  Mild depression.   DISCHARGE DIAGNOSES:  1.  Morbid obesity.  2.  Insulin-dependent diabetes mellitus.  3.  Hypertension.  4.  Hypercholesterolemia.  5.  Obstructive sleep apnea.  6.  Mild depression.  7.  Status post laparoscopic Roux-en-Y gastric bypass surgery and revision      of jejunostomy on postoperative day #3.   ADMITTING PHYSICIAN:  Vikki Ports, MD   CONDITION ON DISCHARGE:  Stable. Discharged to home. Follow-up with me one  week after discharge.   MEDICATIONS:  Unchanged except for Roxicet elixir for pain.   HOSPITAL COURSE:  The patient was admitted, underwent laparoscopic Roux-en-Y  gastric bypass without incident after home bowel prep. On postoperative day  one, after being watched in the intensive care unit because of her  obstructive sleep apnea, she underwent upper GI which showed a questionable  contained leak. The patient clinically looked well. I repeated a swallow  later that day with two lateral views that showed no evidence of leak.  The  patient began complaining of nausea after the upper GI and on postoperative  day #2 continued to have nausea and was watched in the intensive care unit.  Her white count remained stable at 7600, her rate was in the low 100s, and  her temperature was normal. On postoperative day three, the patient  continued to have persistent nausea and Dr. Ezzard Standing ordered a CAT scan of the  abdomen.  This showed proximal dilatation of the gastric lumen as well as  Roux limb and distal decompression consistent with a probable jejunostomy  obstruction. The patient was then examined by myself, Dr. Daphine Deutscher, and Dr.  Ezzard Standing, and was having worsening abdominal discomfort and nausea, and was  taken to the operating room where she underwent laparoscopy, open  laparotomy, resection of the jejunostomy, and complete revision.  Postoperatively, the patient was maintained on the ventilator overnight. The  following day her blood pressures were drifting down in the 80s and a  central venous catheter was placed. She was seen in consultation by the  pulmonologist. By postoperative day #1, chest x-ray was consistent with  volume loss. The  patient tolerated extubation without difficulty and by postoperative day #2  was markedly improved. We slowly advanced her diet over the next two to  three days until she was tolerating shakes and she was discharged home on  August 01, 2004, with scheduled follow-up with me one week after discharge.      KRH/MEDQ  D:  09/09/2004  T:  09/09/2004  Job:  [id number]

## 2010-10-17 NOTE — Discharge Summary (Signed)
NAMEBREYONNA, NAULT                 ACCOUNT NO.:  [id number]   MEDICAL RECORD NO.:  [id number]          PATIENT TYPE:  INP   LOCATION:  0151                         FACILITY:  [facility]   PHYSICIAN:  Alfonse Ras, MD   DATE OF BIRTH:  [birth year]   DATE OF ADMISSION:  03/19/2006  DATE OF DISCHARGE:  03/24/2006                                 DISCHARGE SUMMARY   PROCEDURE:  Ventral hernia repair with mesh.   CONDITION ON DISCHARGE:  Good and improved.   FOLLOW UP:  Follow up with me in 1 week.   DISPOSITION:  Discharged to home.   HOSPITAL COURSE:  The patient was admitted and underwent open ventral hernia  repair with mesh.  On the following day, after receiving Dilaudid, the  patient had a generalized macular rash.  She became intermittently  hypotensive and required bolus infusions that evening.  Urine output was  decreased.  Because of her extensive rash, rise in creatinine up to 3.2,  decreased urine output and intermittent hypotension, she was transferred to  the intensive care unit.  On the intensive care unit, she was seen by  critical care medicine and thought to have an anaphylactic response and  treated with a short course of steroids.  Her renal failure resolved within  24 hours and her urine output markedly increased.  Over the next few days,  she was weaned off the steroids and by October 24, was tolerating her diet,  was ambulating, had no distress and no pain and was ready for discharge  home.      Alfonse Ras, MD  Electronically Signed     KRE/MEDQ  D:  03/24/2006  T:  03/25/2006  Job:  [id number]

## 2010-10-17 NOTE — H&P (Signed)
NAME:  [name], [name] NO.:  [id number]   MEDICAL RECORD NO.:  [id number]                   PATIENT TYPE:  EMS   LOCATION:  MAJO                                 FACILITY:  [facility]   PHYSICIAN:  Lonia Blood, M.D.            DATE OF BIRTH:  [birth year]   DATE OF ADMISSION:  10/21/2003  DATE OF DISCHARGE:                                HISTORY & PHYSICAL   CHIEF COMPLAINT:  Severe dyspnea.   HISTORY OF PRESENT ILLNESS:  Mr. [name] is a 70 year old female with a  longstanding history of questionable vocal cord dysfunction versus asthma  with chronic bronchitis.  She states she has not been well since December  with intermittent episodes of wheezing and severe shortness of breath. Her  most recent episode has been developing over the last week.  It has been  marked by dyspnea at rest and with exertion.  She has noticed increased  wheezing which is audible to those around her. This has progressed over the  last three days despite being given a prescription for Ketek through her  primary care physician in the outpatient setting.  This morning, her  breathing was more labored and her dyspnea was felt more severe and  therefore she felt it would be appropriate to present for evaluation.   At present, she is lying on the stretcher in the emergency room.  Her  wheezes are audible as one enters the room.  She is using accessory muscles  for respiratory.  She is able to talk and complete sentences and her  wheezing is much less pronounced when she is talking.   REVIEW OF SYMPTOMS:  [name] does endorse a headache.  She also complains  of chest tightness which she reports is due to her wheezing. She has no  specific chest pain.  She is sore in the ribs bilaterally which she feels is  secondary to increased work of breathing.  Full review of systems is  otherwise negative.   PAST MEDICAL HISTORY:  1. Morbid obesity.  2. Mild coronary artery disease  involving the left anterior descending     artery without focal stenosis via cardiac catheterization January 2004     with normal LV function at that time.  3. Melanosis coli diagnosed March 2003 via colonoscopy with normal EGD at     that time.  4. Right ankle fracture December 2002.  5. Diabetes mellitus, insulin-dependent.  6. Hypertension.  7. Fibromyalgia.  8. Gastroesophageal reflux disease.  9. Depression.  10.      Hypercholesterolemia.   MEDICATIONS:  1. Lasix 40 mg p.o. p.r.n.  2. Glipizide ER 10 mg b.i.d.  3. Potassium chloride 10 mEq q.d.  4. Meclizine 25 mg b.i.d.  5. Aspirin 325 mg q.d.  6. Actos 45 mg q.d.  7. Clarinex 5 mg q.d.  8. Hyzaar 100/25 mg q.d.  9. Tussionex p.r.n.  10.      Tri-Chlor 145 mg q.d.  11.      Ketek 400 mg 2 q.d.  12.      Prilosec 40 mg b.i.d.  13.      Lipitor 20 mg q.d.  14.      Celexa 40 mg q.d.  15.      Regular insulin 5 units p.r.n.  16.      Albuterol nebulizer p.r.n.  17.      NPH insulin 12 units q.p.m.   ALLERGIES:  The patient is allergic to CODEINE and MORPHINE.   FAMILY HISTORY:  The patient's family history is noncontributory to this  admission.   SOCIAL HISTORY:  The patient is married.  She does not smoke.  She does not  drink alcohol.  She denies previous employment in a [employer] or exposure  to dusty environments.  She does report previous marriage to a extremely  heavy smoker for approximately 20 years.   LABORATORY DATA:  Chest x-ray reveals no acute disease with flattening of  the diaphragm and chronic bronchial thickening.   PHYSICAL EXAMINATION:  VITAL SIGNS:  Temperature 99.2, blood pressure  167/65, heart rate 101, respiratory rate 24.  O2 saturation 91% on room air.  GENERAL:  Morbidly obese Caucasian female lying on the hospital stretcher  with pronounced expiratory wheezing appreciable upon entering the room.  The  patient is using accessory muscles for respiration.  The patient is able to   complete full sentences without stopping.  HEENT:  Normocephalic and atraumatic.  The pupils are equal, round, and  reactive to light and accommodation.  Extraocular movements intact  bilaterally.  OC/OP clear with exception of mild dry mucus membranes.  Hearing grossly intact bilaterally.  NECK:  Short and thick.  Unable to appreciate JVD.  No lymphadenopathy.  No  thyromegaly.  LUNGS:  Prolonged expiratory phase that does end just prior to initiation of  inspiratory phase, pronounced diffuse expiratory wheezes.  No focal  crackles.  CARDIOVASCULAR:  Distant regular rate and rhythm.  No murmurs, rubs, or  gallops appreciable.  ABDOMEN:  Morbidly obese.  Soft, nontender, and nondistended.  Bowel sounds  present.  No hepatosplenomegaly appreciable.  no rebound.  No ascites.  EXTREMITIES:  Trace bilateral lower extremity edema. No erythema.  There are  2+ dorsalis pedis pulses bilaterally.  NEUROLOGICAL:  Cranial nerves II through XII intact bilaterally.  There is  5/5 strength throughout bilateral upper and lower extremities.  No Babinski.  Intact sensation to touch throughout.  Alert and oriented x 4.   IMPRESSION:  1. Vocal cord dysfunction versus acute on chronic bronchitis versus reactive     airway disease:  [name] has a chronic respiratory syndrome that has     not been clearly defined.  She certainly has elements in her evaluation     that are more consistent with vocal cord dysfunction than true lower     airway bronchospasm and wheezing.  She is certainly in a significant     degree of respiratory distress at the time and requires inpatient     observation.  I will dose the patient with intravenously administered     intravenous steroids as I do feel that she is suffering with a component     of bronchospasm and bronchitis.  I will likewise treat the patient with     Avelox 400 mg intravenously every day.  Albuterol nebulizers will be    administered initially.  We will have  to watch closely to assure that     these do not worsen her bronchospasm.  2. Diabetes:  The patient has longstanding history of diabetes mellitus.  I     will check a hemoglobin A1C to assess her baseline control.  I will     supplement her care with sliding scale insulin using Humalog on an     insulin-resistant scale.  We will dose her on NPH insulin as she tells me     that she typically uses.  3. Hypertension:  I will continue her usual antihypertensive medications.     We will follow her blood pressure closely during this hospitalization.  4. Hyperlipidemia:  We will continue her usual lipid controlling     medications.  5. Gastroesophageal reflux disease:  Given the significant contribution of     this to the possibility of vocal cord dysfunction, I will continue her     proton pump inhibitor on a b.i.d. scheduling dose.  We will consider     Reglan if symptoms of reflux worsen during this hospitalization.                                                Lonia Blood, M.D.    JTM/MEDQ  D:  10/21/2003  T:  10/21/2003  Job:  [id number]

## 2010-12-30 ENCOUNTER — Other Ambulatory Visit: Payer: Self-pay | Admitting: Gynecology

## 2010-12-30 DIAGNOSIS — Z1231 Encounter for screening mammogram for malignant neoplasm of breast: Secondary | ICD-10-CM

## 2011-01-06 ENCOUNTER — Ambulatory Visit
Admission: RE | Admit: 2011-01-06 | Discharge: 2011-01-06 | Disposition: A | Discharge status: Home / Self Care | Payer: Self-pay | Source: Ambulatory Visit | Attending: Gynecology | Admitting: Gynecology

## 2011-01-06 DIAGNOSIS — Z1231 Encounter for screening mammogram for malignant neoplasm of breast: Secondary | ICD-10-CM

## 2011-01-09 ENCOUNTER — Other Ambulatory Visit: Payer: Self-pay | Admitting: Gynecology

## 2011-01-09 DIAGNOSIS — R928 Other abnormal and inconclusive findings on diagnostic imaging of breast: Secondary | ICD-10-CM

## 2011-01-20 ENCOUNTER — Ambulatory Visit
Admission: RE | Admit: 2011-01-20 | Discharge: 2011-01-20 | Disposition: A | Discharge status: Home / Self Care | Payer: Medicare Other | Source: Ambulatory Visit | Attending: Gynecology | Admitting: Gynecology

## 2011-01-20 DIAGNOSIS — R928 Other abnormal and inconclusive findings on diagnostic imaging of breast: Secondary | ICD-10-CM

## 2011-03-03 LAB — BASIC METABOLIC PANEL
BUN: 9
Creatinine, Ser: 0.7
GFR calc non Af Amer: >60
Glucose, Bld: 85
Potassium: 3.7

## 2011-03-03 LAB — CBC
HCT: 40.1
Platelets: 253
RDW: 13.5
WBC: 4.5

## 2011-03-05 LAB — POCT I-STAT, CHEM 8
BUN: 13 mg/dL (ref 6–23)
Calcium, Ion: 1.11 mmol/L — ABNORMAL LOW (ref 1.12–1.32)
Chloride: 105 mEq/L (ref 96–112)
HCT: 41 % (ref 36.0–46.0)
Sodium: 139 mEq/L (ref 135–145)
TCO2: 27 mmol/L (ref 0–100)

## 2011-03-11 LAB — COMPREHENSIVE METABOLIC PANEL
AST: 30
CO2: 24
Calcium: 8.9
Creatinine, Ser: 0.66
GFR calc Af Amer: >60
GFR calc non Af Amer: >60
Total Protein: 6

## 2011-03-11 LAB — I-STAT 8, (EC8 V) (CONVERTED LAB)
Chloride: 105
HCT: 37
Hemoglobin: 12.6
Operator id: 198171
Potassium: 3.5
Sodium: 141
TCO2: 28

## 2011-03-11 LAB — HEPARIN LEVEL (UNFRACTIONATED): Heparin Unfractionated: 0.26 — ABNORMAL LOW

## 2011-03-11 LAB — CBC
HCT: 33.7 — ABNORMAL LOW
Hemoglobin: 11.2 — ABNORMAL LOW
MCHC: 33.2
Platelets: 382
RBC: 3.73 — ABNORMAL LOW
RBC: 3.77 — ABNORMAL LOW
WBC: 4.4

## 2011-03-11 LAB — CK TOTAL AND CKMB (NOT AT ARMC)
CK, MB: 0.7
CK, MB: 0.9
Relative Index: INVALID
Total CK: 25
Total CK: 55

## 2011-03-11 LAB — DIFFERENTIAL
Lymphocytes Relative: 39
Lymphs Abs: 1.7
Monocytes Relative: 6
Neutrophils Relative %: 51

## 2011-03-11 LAB — TROPONIN I
Troponin I: 0.02
Troponin I: 0.03

## 2011-03-11 LAB — TSH: TSH: 2.034

## 2011-03-11 LAB — POCT I-STAT CREATININE
Creatinine, Ser: 0.8
Operator id: 198171

## 2011-03-11 LAB — PROTIME-INR
INR: 1
Prothrombin Time: 13.1

## 2011-03-11 LAB — POCT CARDIAC MARKERS
Operator id: 198171
Operator id: 198171
Troponin i, poc: <0.05

## 2011-07-14 DIAGNOSIS — D510 Vitamin B12 deficiency anemia due to intrinsic factor deficiency: Secondary | ICD-10-CM | POA: Diagnosis not present

## 2011-07-14 DIAGNOSIS — I10 Essential (primary) hypertension: Secondary | ICD-10-CM | POA: Diagnosis not present

## 2011-07-14 DIAGNOSIS — E559 Vitamin D deficiency, unspecified: Secondary | ICD-10-CM | POA: Diagnosis not present

## 2011-07-14 DIAGNOSIS — E1129 Type 2 diabetes mellitus with other diabetic kidney complication: Secondary | ICD-10-CM | POA: Diagnosis not present

## 2011-08-18 DIAGNOSIS — H35049 Retinal micro-aneurysms, unspecified, unspecified eye: Secondary | ICD-10-CM | POA: Diagnosis not present

## 2011-08-18 DIAGNOSIS — E11359 Type 2 diabetes mellitus with proliferative diabetic retinopathy without macular edema: Secondary | ICD-10-CM | POA: Diagnosis not present

## 2011-08-18 DIAGNOSIS — H43819 Vitreous degeneration, unspecified eye: Secondary | ICD-10-CM | POA: Diagnosis not present

## 2011-08-18 DIAGNOSIS — E1139 Type 2 diabetes mellitus with other diabetic ophthalmic complication: Secondary | ICD-10-CM | POA: Diagnosis not present

## 2011-08-18 DIAGNOSIS — H35379 Puckering of macula, unspecified eye: Secondary | ICD-10-CM | POA: Diagnosis not present

## 2011-08-21 ENCOUNTER — Emergency Department (HOSPITAL_COMMUNITY): Payer: Medicare Other

## 2011-08-21 ENCOUNTER — Emergency Department (HOSPITAL_COMMUNITY)
Admission: EM | Admit: 2011-08-21 | Discharge: 2011-08-21 | Disposition: A | Discharge status: Home / Self Care | Payer: Medicare Other | Attending: Emergency Medicine | Admitting: Emergency Medicine

## 2011-08-21 ENCOUNTER — Other Ambulatory Visit: Payer: Self-pay

## 2011-08-21 ENCOUNTER — Encounter (HOSPITAL_COMMUNITY): Payer: Self-pay | Admitting: Emergency Medicine

## 2011-08-21 DIAGNOSIS — I10 Essential (primary) hypertension: Secondary | ICD-10-CM | POA: Diagnosis not present

## 2011-08-21 DIAGNOSIS — E119 Type 2 diabetes mellitus without complications: Secondary | ICD-10-CM | POA: Insufficient documentation

## 2011-08-21 DIAGNOSIS — E789 Disorder of lipoprotein metabolism, unspecified: Secondary | ICD-10-CM | POA: Insufficient documentation

## 2011-08-21 DIAGNOSIS — I517 Cardiomegaly: Secondary | ICD-10-CM | POA: Diagnosis not present

## 2011-08-21 DIAGNOSIS — Z794 Long term (current) use of insulin: Secondary | ICD-10-CM | POA: Insufficient documentation

## 2011-08-21 DIAGNOSIS — R0989 Other specified symptoms and signs involving the circulatory and respiratory systems: Secondary | ICD-10-CM | POA: Insufficient documentation

## 2011-08-21 DIAGNOSIS — Z79899 Other long term (current) drug therapy: Secondary | ICD-10-CM | POA: Insufficient documentation

## 2011-08-21 DIAGNOSIS — R0609 Other forms of dyspnea: Secondary | ICD-10-CM | POA: Insufficient documentation

## 2011-08-21 DIAGNOSIS — R079 Chest pain, unspecified: Secondary | ICD-10-CM | POA: Insufficient documentation

## 2011-08-21 DIAGNOSIS — Z7982 Long term (current) use of aspirin: Secondary | ICD-10-CM | POA: Insufficient documentation

## 2011-08-21 HISTORY — DX: Pure hypercholesterolemia, unspecified: E78.00

## 2011-08-21 HISTORY — DX: Essential (primary) hypertension: I10

## 2011-08-21 LAB — CBC
Hemoglobin: 12.5 g/dL (ref 12.0–15.0)
MCHC: 31.5 g/dL (ref 30.0–36.0)
Platelets: 316 10*3/uL (ref 150–400)
RDW: 14 % (ref 11.5–15.5)

## 2011-08-21 LAB — COMPREHENSIVE METABOLIC PANEL
AST: 18 U/L (ref 0–37)
Albumin: 3.6 g/dL (ref 3.5–5.2)
Alkaline Phosphatase: 89 U/L (ref 39–117)
Chloride: 100 mEq/L (ref 96–112)
Potassium: 3.7 mEq/L (ref 3.5–5.1)
Total Bilirubin: 0.5 mg/dL (ref 0.3–1.2)

## 2011-08-21 LAB — GLUCOSE, CAPILLARY
Glucose-Capillary: 83 mg/dL (ref 70–99)
Glucose-Capillary: 57 mg/dL — ABNORMAL LOW (ref 70–99)
Glucose-Capillary: 84 mg/dL (ref 70–99)

## 2011-08-21 LAB — DIFFERENTIAL
Basophils Absolute: 0.1 10*3/uL (ref 0.0–0.1)
Basophils Relative: 1 % (ref 0–1)
Neutro Abs: 3.5 10*3/uL (ref 1.7–7.7)
Neutrophils Relative %: 55 % (ref 43–77)

## 2011-08-21 LAB — POCT I-STAT TROPONIN I: Troponin i, poc: 0 ng/mL (ref 0.00–0.08)

## 2011-08-21 LAB — TROPONIN I: Troponin I: <0.3 ng/mL (ref <0.30)

## 2011-08-21 MED ORDER — ASPIRIN 81 MG PO CHEW: 324.0000 mg | CHEWABLE_TABLET | Freq: Once | ORAL | Status: DC

## 2011-08-21 MED ORDER — SODIUM CHLORIDE 0.9 % IV SOLN
Freq: Once | INTRAVENOUS | Status: AC
  Administered 2011-08-21: 20 mL/h via INTRAVENOUS

## 2011-08-21 MED ORDER — HYDROCODONE-ACETAMINOPHEN 5-325 MG PO TABS: 1.0000 | ORAL_TABLET | Freq: Four times a day (QID) | ORAL | Status: AC | PRN

## 2011-08-21 MED ORDER — HYDROMORPHONE HCL PF 1 MG/ML IJ SOLN
0.5000 mg | Freq: Once | INTRAMUSCULAR | Status: AC
  Administered 2011-08-21: 0.5 mg via INTRAVENOUS
  Filled 2011-08-21: qty 1

## 2011-08-21 NOTE — ED Provider Notes (Signed)
History     CSN: [id number]  Arrival date & time 08/21/11  1235   First MD Initiated Contact with Patient 08/21/11 1335      Chief Complaint  Patient presents with  . Chest Pain    (Consider location/radiation/quality/duration/timing/severity/associated sxs/prior treatment) Patient is a 71 y.o. female presenting with chest pain. The history is provided by the patient (The patient complains of chest pain off and on for a week. She also states she's had some dyspnea on exertion). No language interpreter was used.  Chest Pain The chest pain began 1 - 2 weeks ago. Chest pain occurs frequently. The chest pain is unchanged. The pain is associated with exertion. At its most intense, the pain is at 3/10. The pain is currently at 2/10. The severity of the pain is moderate. The quality of the pain is described as aching. The pain does not radiate. Chest pain is worsened by exertion. Pertinent negatives for primary symptoms include no fever, no fatigue, no cough and no abdominal pain.  Pertinent negatives for past medical history include no seizures.     Past Medical History  Diagnosis Date  . Hypertension   . Diabetes mellitus   . High cholesterol     Past Surgical History  Procedure Date  . Stents     last time she said was 1 yr ago   . Breast surgery     No family history on file.  History  Substance Use Topics  . Smoking status: Not on file  . Smokeless tobacco: Not on file  . Alcohol Use:     OB History    Grav Para Term Preterm Abortions TAB SAB Ect Mult Living                  Review of Systems  Constitutional: Negative for fever and fatigue.  HENT: Negative for congestion, sinus pressure and ear discharge.   Eyes: Negative for discharge.  Respiratory: Negative for cough.   Cardiovascular: Positive for chest pain.  Gastrointestinal: Negative for abdominal pain and diarrhea.  Genitourinary: Negative for frequency and hematuria.  Musculoskeletal: Negative for back  pain.  Skin: Negative for rash.  Neurological: Negative for seizures and headaches.  Hematological: Negative.   Psychiatric/Behavioral: Negative for hallucinations.    Allergies  Advair diskus; Albuterol; Benzonatate; Fosamax; Gabapentin; Iohexol; Keflex; Morphine; Other; Propoxyphene hcl; Reclast; Avapro; Codeine; Tessalon perles; and Tramadol  Home Medications   Current Outpatient Rx  Name Route Sig Dispense Refill  . ASPIRIN 81 MG PO TABS Oral Take 81 mg by mouth daily.      . ATORVASTATIN CALCIUM 20 MG PO TABS Oral Take 20 mg by mouth daily. For chloesterol     . CETIRIZINE HCL 10 MG PO TABS Oral Take 10 mg by mouth daily. For allergies     . VITAMIN D3 5000 UNITS PO CAPS Oral Take by mouth daily. For supplement      . CITALOPRAM HYDROBROMIDE 40 MG PO TABS Oral Take 40 mg by mouth daily.      Marland Kitchen CORAL CALCIUM 1000 (390 CA) MG PO TABS Oral Take 1 tablet by mouth daily.      Marland Kitchen FLUTICASONE PROPIONATE 50 MCG/ACT NA SUSP Nasal 1 spray by Nasal route daily as needed. In each nostril     . FOLIC ACID 1 MG PO TABS Oral Take 1 mg by mouth daily.      Marland Kitchen GLIPIZIDE ER 10 MG PO TB24 Oral Take 10 mg by mouth 2 (  two) times daily. Take in the morning and evening to control blood sugar     . INSULIN ISOPHANE HUMAN 100 UNIT/ML [place] SUSP Subcutaneous Inject into the skin as directed. 20 units in the morning and 25 units in the evening to control diabetes.     . INSULIN REGULAR HUMAN 100 UNIT/ML IJ SOLN Subcutaneous Inject into the skin as directed. 0 units if <150 1 unit if 151-200 2 units if 201-300 3 units if 251-300 4 units if 301-400 5 units if 401 or >     . LOSARTAN POTASSIUM-HCTZ 50-12.5 MG PO TABS Oral Take 1 tablet by mouth daily. To control blood presure     . MAGNESIUM OXIDE 250 MG PO TABS Oral Take by mouth daily.      Marland Kitchen NITROGLYCERIN 0.4 MG SL SUBL Sublingual Place 0.4 mg under the tongue every 5 (five) minutes as needed. For chest pain. Maximum of 3 tablets in 15 minutes.     .  OMEPRAZOLE 40 MG PO CPDR Oral Take 40 mg by mouth 2 (two) times daily. For acid reflux. Cannot use generic     . CYANOCOBALAMIN 1000 MCG/ML IJ SOLN Intramuscular Inject 1,000 mcg into the muscle every 30 (thirty) days. For B12 supplement     . GLUCOSE BLOOD VI STRP Other 1 each by Other route 3 (three) times daily as needed. Use as instructed       BP 184/60  Pulse 71  Resp 24  Ht 5\' 2"  (1.575 m)  Wt 200 lb (90.719 kg)  BMI 36.58 kg/m2  SpO2 99%  Physical Exam  Constitutional: She is oriented to person, place, and time. She appears well-developed.  HENT:  Head: Normocephalic and atraumatic.  Eyes: Conjunctivae and EOM are normal. No scleral icterus.  Neck: Neck supple. No thyromegaly present.  Cardiovascular: Normal rate and regular rhythm.  Exam reveals no gallop and no friction rub.   No murmur heard. Pulmonary/Chest: No stridor. She has no wheezes. She has no rales. She exhibits no tenderness.  Abdominal: She exhibits no distension. There is no tenderness. There is no rebound.  Musculoskeletal: Normal range of motion. She exhibits no edema.  Lymphadenopathy:    She has no cervical adenopathy.  Neurological: She is oriented to person, place, and time. Coordination normal.  Skin: No rash noted. No erythema.  Psychiatric: She has a normal mood and affect. Her behavior is normal.    ED Course  Procedures (including critical care time)  Labs Reviewed  COMPREHENSIVE METABOLIC PANEL - Abnormal; Notable for the following:    Glucose, Bld 100 (*)    GFR calc non Af Amer 83 (*)    All other components within normal limits  GLUCOSE, CAPILLARY - Abnormal; Notable for the following:    Glucose-Capillary 60 (*)    All other components within normal limits  POCT I-STAT TROPONIN I  CBC  DIFFERENTIAL  GLUCOSE, CAPILLARY  TROPONIN I   Dg Chest 2 View  08/21/2011  *RADIOLOGY REPORT*  Clinical Data: Chest pain  CHEST - 2 VIEW  Comparison: Chest x-ray of 09/19/2009  Findings: The lungs  are clear.  Cardiomegaly is stable.  No acute bony abnormality is seen.  IMPRESSION: Stable cardiomegaly.  No active lung disease.  Original Report Authenticated By: Juline Patch, M.D.   Results for orders placed during the hospital encounter of 08/21/11  POCT I-STAT TROPONIN I      Component Value Range   Troponin i, poc 0.00  0.00 - 0.08 (  ng/mL)   Comment 3           CBC      Component Value Range   WBC 6.2  4.0 - 10.5 (K/uL)   RBC 4.36  3.87 - 5.11 (MIL/uL)   Hemoglobin 12.5  12.0 - 15.0 (g/dL)   HCT 16.1  09.6 - 04.5 (%)   MCV 91.1  78.0 - 100.0 (fL)   MCH 28.7  26.0 - 34.0 (pg)   MCHC 31.5  30.0 - 36.0 (g/dL)   RDW 40.9  81.1 - 91.4 (%)   Platelets 316  150 - 400 (K/uL)  DIFFERENTIAL      Component Value Range   Neutrophils Relative 55  43 - 77 (%)   Neutro Abs 3.5  1.7 - 7.7 (K/uL)   Lymphocytes Relative 34  12 - 46 (%)   Lymphs Abs 2.1  0.7 - 4.0 (K/uL)   Monocytes Relative 7  3 - 12 (%)   Monocytes Absolute 0.4  0.1 - 1.0 (K/uL)   Eosinophils Relative 3  0 - 5 (%)   Eosinophils Absolute 0.2  0.0 - 0.7 (K/uL)   Basophils Relative 1  0 - 1 (%)   Basophils Absolute 0.1  0.0 - 0.1 (K/uL)  COMPREHENSIVE METABOLIC PANEL      Component Value Range   Sodium 136  135 - 145 (mEq/L)   Potassium 3.7  3.5 - 5.1 (mEq/L)   Chloride 100  96 - 112 (mEq/L)   CO2 28  19 - 32 (mEq/L)   Glucose, Bld 100 (*) 70 - 99 (mg/dL)   BUN 16  6 - 23 (mg/dL)   Creatinine, Ser 7.82  0.50 - 1.10 (mg/dL)   Calcium 9.2  8.4 - 95.6 (mg/dL)   Total Protein 6.7  6.0 - 8.3 (g/dL)   Albumin 3.6  3.5 - 5.2 (g/dL)   AST 18  0 - 37 (U/L)   ALT 15  0 - 35 (U/L)   Alkaline Phosphatase 89  39 - 117 (U/L)   Total Bilirubin 0.5  0.3 - 1.2 (mg/dL)   GFR calc non Af Amer 83 (*) >90 (mL/min)   GFR calc Af Amer >90  >90 (mL/min)  GLUCOSE, CAPILLARY      Component Value Range   Glucose-Capillary 60 (*) 70 - 99 (mg/dL)  GLUCOSE, CAPILLARY      Component Value Range   Glucose-Capillary 83  70 - 99 (mg/dL)    Comment 1 Notify RN     Comment 2 Documented in Chart    TROPONIN I      Component Value Range   Troponin I <0.30  <0.30 (ng/mL)  GLUCOSE, CAPILLARY      Component Value Range   Glucose-Capillary 57 (*) 70 - 99 (mg/dL)   Comment 1 Notify RN     Comment 2 Documented in Chart    GLUCOSE, CAPILLARY      Component Value Range   Glucose-Capillary 84  70 - 99 (mg/dL)   Comment 1 Notify RN     Comment 2 Documented in Chart     Dg Chest 2 View  08/21/2011  *RADIOLOGY REPORT*  Clinical Data: Chest pain  CHEST - 2 VIEW  Comparison: Chest x-ray of 09/19/2009  Findings: The lungs are clear.  Cardiomegaly is stable.  No acute bony abnormality is seen.  IMPRESSION: Stable cardiomegaly.  No active lung disease.  Original Report Authenticated By: Juline Patch, M.D.      No diagnosis found.  Date: 08/21/2011  Rate: 59  Rhythm: sinus bradycardia  QRS Axis: normal  Intervals: normal  ST/T Wave abnormalities: nonspecific ST changes  Conduction Disutrbances:none  Narrative Interpretation:   Old EKG Reviewed: unchanged I spoke to Dr. Donnie Aho and he stated if the pt was feeling better and we got a 2nd cardiac enzyme that was normal the pt could follow up with him.  She has had 5 caths all minimal disease last in 2010 MDM   Chest pain improved with pain meds       Benny Lennert, MD 08/24/11 1123

## 2011-08-21 NOTE — ED Notes (Signed)
Pt states that she has chest pain for the past 3 days worse today made her lt arm numb and radates to her lt shoulder blade did take nitro the other times and it made the pain better did not take it today but took asa.

## 2011-08-21 NOTE — Discharge Instructions (Signed)
Follow up with dr. Donnie Aho next week

## 2011-08-25 DIAGNOSIS — M5030 Other cervical disc degeneration, unspecified cervical region: Secondary | ICD-10-CM | POA: Diagnosis not present

## 2011-08-25 DIAGNOSIS — M25519 Pain in unspecified shoulder: Secondary | ICD-10-CM | POA: Diagnosis not present

## 2011-08-25 DIAGNOSIS — M542 Cervicalgia: Secondary | ICD-10-CM | POA: Diagnosis not present

## 2011-09-01 DIAGNOSIS — M5030 Other cervical disc degeneration, unspecified cervical region: Secondary | ICD-10-CM | POA: Diagnosis not present

## 2011-09-10 DIAGNOSIS — M542 Cervicalgia: Secondary | ICD-10-CM | POA: Diagnosis not present

## 2011-09-10 DIAGNOSIS — M25519 Pain in unspecified shoulder: Secondary | ICD-10-CM | POA: Diagnosis not present

## 2011-09-10 DIAGNOSIS — M546 Pain in thoracic spine: Secondary | ICD-10-CM | POA: Diagnosis not present

## 2011-10-12 DIAGNOSIS — M19019 Primary osteoarthritis, unspecified shoulder: Secondary | ICD-10-CM | POA: Diagnosis not present

## 2011-10-19 DIAGNOSIS — N281 Cyst of kidney, acquired: Secondary | ICD-10-CM | POA: Diagnosis not present

## 2011-10-19 DIAGNOSIS — N390 Urinary tract infection, site not specified: Secondary | ICD-10-CM | POA: Diagnosis not present

## 2011-10-21 ENCOUNTER — Other Ambulatory Visit: Payer: Self-pay | Admitting: Dermatology

## 2011-10-21 DIAGNOSIS — D2360 Other benign neoplasm of skin of unspecified upper limb, including shoulder: Secondary | ICD-10-CM | POA: Diagnosis not present

## 2011-10-21 DIAGNOSIS — L570 Actinic keratosis: Secondary | ICD-10-CM | POA: Diagnosis not present

## 2011-10-21 DIAGNOSIS — D485 Neoplasm of uncertain behavior of skin: Secondary | ICD-10-CM | POA: Diagnosis not present

## 2011-10-21 DIAGNOSIS — L821 Other seborrheic keratosis: Secondary | ICD-10-CM | POA: Diagnosis not present

## 2011-10-21 DIAGNOSIS — L820 Inflamed seborrheic keratosis: Secondary | ICD-10-CM | POA: Diagnosis not present

## 2011-11-12 DIAGNOSIS — R339 Retention of urine, unspecified: Secondary | ICD-10-CM | POA: Diagnosis not present

## 2011-11-12 DIAGNOSIS — Z23 Encounter for immunization: Secondary | ICD-10-CM | POA: Diagnosis not present

## 2011-11-12 DIAGNOSIS — R609 Edema, unspecified: Secondary | ICD-10-CM | POA: Diagnosis not present

## 2011-11-12 DIAGNOSIS — E1129 Type 2 diabetes mellitus with other diabetic kidney complication: Secondary | ICD-10-CM | POA: Diagnosis not present

## 2011-11-12 DIAGNOSIS — D510 Vitamin B12 deficiency anemia due to intrinsic factor deficiency: Secondary | ICD-10-CM | POA: Diagnosis not present

## 2011-12-09 ENCOUNTER — Other Ambulatory Visit: Payer: Self-pay | Admitting: Gastroenterology

## 2011-12-09 DIAGNOSIS — Z1211 Encounter for screening for malignant neoplasm of colon: Secondary | ICD-10-CM

## 2011-12-09 DIAGNOSIS — J385 Laryngeal spasm: Secondary | ICD-10-CM | POA: Diagnosis not present

## 2011-12-09 DIAGNOSIS — K219 Gastro-esophageal reflux disease without esophagitis: Secondary | ICD-10-CM | POA: Diagnosis not present

## 2011-12-09 DIAGNOSIS — K439 Ventral hernia without obstruction or gangrene: Secondary | ICD-10-CM | POA: Diagnosis not present

## 2011-12-15 DIAGNOSIS — R490 Dysphonia: Secondary | ICD-10-CM | POA: Diagnosis not present

## 2011-12-23 ENCOUNTER — Other Ambulatory Visit: Payer: Medicare Other

## 2012-01-18 LAB — HM DEXA SCAN

## 2012-02-17 DIAGNOSIS — Z23 Encounter for immunization: Secondary | ICD-10-CM | POA: Diagnosis not present

## 2012-02-23 DIAGNOSIS — H35379 Puckering of macula, unspecified eye: Secondary | ICD-10-CM | POA: Diagnosis not present

## 2012-02-23 DIAGNOSIS — E11339 Type 2 diabetes mellitus with moderate nonproliferative diabetic retinopathy without macular edema: Secondary | ICD-10-CM | POA: Diagnosis not present

## 2012-02-23 DIAGNOSIS — E1139 Type 2 diabetes mellitus with other diabetic ophthalmic complication: Secondary | ICD-10-CM | POA: Diagnosis not present

## 2012-02-24 ENCOUNTER — Other Ambulatory Visit: Payer: Self-pay | Admitting: Gynecology

## 2012-02-24 DIAGNOSIS — Z1231 Encounter for screening mammogram for malignant neoplasm of breast: Secondary | ICD-10-CM

## 2012-02-24 DIAGNOSIS — Z9889 Other specified postprocedural states: Secondary | ICD-10-CM

## 2012-02-25 ENCOUNTER — Ambulatory Visit
Admission: RE | Admit: 2012-02-25 | Discharge: 2012-02-25 | Disposition: A | Discharge status: Home / Self Care | Payer: Medicare Other | Source: Ambulatory Visit | Attending: Gynecology | Admitting: Gynecology

## 2012-02-25 DIAGNOSIS — Z1231 Encounter for screening mammogram for malignant neoplasm of breast: Secondary | ICD-10-CM | POA: Diagnosis not present

## 2012-02-25 DIAGNOSIS — Z9889 Other specified postprocedural states: Secondary | ICD-10-CM

## 2012-02-25 LAB — HM MAMMOGRAPHY

## 2012-02-27 DIAGNOSIS — M76899 Other specified enthesopathies of unspecified lower limb, excluding foot: Secondary | ICD-10-CM | POA: Diagnosis not present

## 2012-02-27 DIAGNOSIS — M25559 Pain in unspecified hip: Secondary | ICD-10-CM | POA: Diagnosis not present

## 2012-02-27 DIAGNOSIS — M545 Low back pain: Secondary | ICD-10-CM | POA: Diagnosis not present

## 2012-03-21 DIAGNOSIS — B373 Candidiasis of vulva and vagina: Secondary | ICD-10-CM | POA: Diagnosis not present

## 2012-03-21 DIAGNOSIS — E2839 Other primary ovarian failure: Secondary | ICD-10-CM | POA: Diagnosis not present

## 2012-03-21 DIAGNOSIS — M810 Age-related osteoporosis without current pathological fracture: Secondary | ICD-10-CM | POA: Diagnosis not present

## 2012-04-27 DIAGNOSIS — M949 Disorder of cartilage, unspecified: Secondary | ICD-10-CM | POA: Diagnosis not present

## 2012-04-27 DIAGNOSIS — D510 Vitamin B12 deficiency anemia due to intrinsic factor deficiency: Secondary | ICD-10-CM | POA: Diagnosis not present

## 2012-04-27 DIAGNOSIS — E559 Vitamin D deficiency, unspecified: Secondary | ICD-10-CM | POA: Diagnosis not present

## 2012-04-27 DIAGNOSIS — M899 Disorder of bone, unspecified: Secondary | ICD-10-CM | POA: Diagnosis not present

## 2012-04-27 DIAGNOSIS — E1129 Type 2 diabetes mellitus with other diabetic kidney complication: Secondary | ICD-10-CM | POA: Diagnosis not present

## 2012-04-27 DIAGNOSIS — IMO0001 Reserved for inherently not codable concepts without codable children: Secondary | ICD-10-CM | POA: Diagnosis not present

## 2012-04-27 DIAGNOSIS — E785 Hyperlipidemia, unspecified: Secondary | ICD-10-CM | POA: Diagnosis not present

## 2012-09-22 DIAGNOSIS — M545 Low back pain: Secondary | ICD-10-CM | POA: Diagnosis not present

## 2012-09-22 DIAGNOSIS — M47817 Spondylosis without myelopathy or radiculopathy, lumbosacral region: Secondary | ICD-10-CM | POA: Diagnosis not present

## 2012-09-22 DIAGNOSIS — M76899 Other specified enthesopathies of unspecified lower limb, excluding foot: Secondary | ICD-10-CM | POA: Diagnosis not present

## 2012-09-26 ENCOUNTER — Other Ambulatory Visit: Payer: Self-pay | Admitting: Orthopedic Surgery

## 2012-09-26 DIAGNOSIS — M545 Low back pain: Secondary | ICD-10-CM

## 2012-09-27 DIAGNOSIS — E1139 Type 2 diabetes mellitus with other diabetic ophthalmic complication: Secondary | ICD-10-CM | POA: Diagnosis not present

## 2012-09-27 DIAGNOSIS — E11339 Type 2 diabetes mellitus with moderate nonproliferative diabetic retinopathy without macular edema: Secondary | ICD-10-CM | POA: Diagnosis not present

## 2012-09-27 DIAGNOSIS — H35379 Puckering of macula, unspecified eye: Secondary | ICD-10-CM | POA: Diagnosis not present

## 2012-10-06 ENCOUNTER — Other Ambulatory Visit: Payer: Medicare Other

## 2012-10-09 ENCOUNTER — Other Ambulatory Visit: Payer: Medicare Other

## 2012-10-11 ENCOUNTER — Ambulatory Visit
Admission: RE | Admit: 2012-10-11 | Discharge: 2012-10-11 | Disposition: A | Discharge status: Home / Self Care | Payer: Medicare Other | Source: Ambulatory Visit | Attending: Orthopedic Surgery | Admitting: Orthopedic Surgery

## 2012-10-11 DIAGNOSIS — M5137 Other intervertebral disc degeneration, lumbosacral region: Secondary | ICD-10-CM | POA: Diagnosis not present

## 2012-10-11 DIAGNOSIS — M47817 Spondylosis without myelopathy or radiculopathy, lumbosacral region: Secondary | ICD-10-CM | POA: Diagnosis not present

## 2012-10-11 DIAGNOSIS — M545 Low back pain: Secondary | ICD-10-CM

## 2012-10-18 ENCOUNTER — Other Ambulatory Visit: Payer: Self-pay | Admitting: Dermatology

## 2012-10-18 DIAGNOSIS — D692 Other nonthrombocytopenic purpura: Secondary | ICD-10-CM | POA: Diagnosis not present

## 2012-10-18 DIAGNOSIS — L819 Disorder of pigmentation, unspecified: Secondary | ICD-10-CM | POA: Diagnosis not present

## 2012-10-18 DIAGNOSIS — L820 Inflamed seborrheic keratosis: Secondary | ICD-10-CM | POA: Diagnosis not present

## 2012-10-18 DIAGNOSIS — D1801 Hemangioma of skin and subcutaneous tissue: Secondary | ICD-10-CM | POA: Diagnosis not present

## 2012-10-18 DIAGNOSIS — L821 Other seborrheic keratosis: Secondary | ICD-10-CM | POA: Diagnosis not present

## 2012-10-18 DIAGNOSIS — L538 Other specified erythematous conditions: Secondary | ICD-10-CM | POA: Diagnosis not present

## 2012-10-18 DIAGNOSIS — D485 Neoplasm of uncertain behavior of skin: Secondary | ICD-10-CM | POA: Diagnosis not present

## 2012-10-18 DIAGNOSIS — D2360 Other benign neoplasm of skin of unspecified upper limb, including shoulder: Secondary | ICD-10-CM | POA: Diagnosis not present

## 2012-10-18 DIAGNOSIS — D239 Other benign neoplasm of skin, unspecified: Secondary | ICD-10-CM | POA: Diagnosis not present

## 2012-10-18 DIAGNOSIS — L905 Scar conditions and fibrosis of skin: Secondary | ICD-10-CM | POA: Diagnosis not present

## 2012-10-31 DIAGNOSIS — N058 Unspecified nephritic syndrome with other morphologic changes: Secondary | ICD-10-CM | POA: Diagnosis not present

## 2012-10-31 DIAGNOSIS — E785 Hyperlipidemia, unspecified: Secondary | ICD-10-CM | POA: Diagnosis not present

## 2012-10-31 DIAGNOSIS — E1129 Type 2 diabetes mellitus with other diabetic kidney complication: Secondary | ICD-10-CM | POA: Diagnosis not present

## 2012-10-31 DIAGNOSIS — M899 Disorder of bone, unspecified: Secondary | ICD-10-CM | POA: Diagnosis not present

## 2012-10-31 DIAGNOSIS — I2510 Atherosclerotic heart disease of native coronary artery without angina pectoris: Secondary | ICD-10-CM | POA: Diagnosis not present

## 2012-10-31 DIAGNOSIS — G4730 Sleep apnea, unspecified: Secondary | ICD-10-CM | POA: Diagnosis not present

## 2012-10-31 DIAGNOSIS — D510 Vitamin B12 deficiency anemia due to intrinsic factor deficiency: Secondary | ICD-10-CM | POA: Diagnosis not present

## 2012-10-31 DIAGNOSIS — R413 Other amnesia: Secondary | ICD-10-CM | POA: Diagnosis not present

## 2012-11-01 DIAGNOSIS — M549 Dorsalgia, unspecified: Secondary | ICD-10-CM | POA: Diagnosis not present

## 2012-11-01 DIAGNOSIS — Q762 Congenital spondylolisthesis: Secondary | ICD-10-CM | POA: Diagnosis not present

## 2012-11-01 DIAGNOSIS — M25559 Pain in unspecified hip: Secondary | ICD-10-CM | POA: Diagnosis not present

## 2012-11-07 ENCOUNTER — Other Ambulatory Visit: Payer: Self-pay | Admitting: Internal Medicine

## 2012-11-25 ENCOUNTER — Encounter: Payer: Self-pay | Admitting: *Deleted

## 2012-12-05 ENCOUNTER — Encounter: Payer: Self-pay | Admitting: *Deleted

## 2012-12-05 ENCOUNTER — Other Ambulatory Visit: Payer: Self-pay | Admitting: *Deleted

## 2012-12-06 ENCOUNTER — Ambulatory Visit (INDEPENDENT_AMBULATORY_CARE_PROVIDER_SITE_OTHER): Payer: Medicare Other | Admitting: Internal Medicine

## 2012-12-06 VITALS — BP 140/80 | HR 93 | Temp 98.5°F | Resp 18 | Ht 62.0 in | Wt 204.4 lb

## 2012-12-06 DIAGNOSIS — M47817 Spondylosis without myelopathy or radiculopathy, lumbosacral region: Secondary | ICD-10-CM | POA: Diagnosis not present

## 2012-12-06 DIAGNOSIS — M545 Low back pain: Secondary | ICD-10-CM

## 2012-12-06 DIAGNOSIS — R609 Edema, unspecified: Secondary | ICD-10-CM

## 2012-12-06 DIAGNOSIS — E7800 Pure hypercholesterolemia, unspecified: Secondary | ICD-10-CM | POA: Insufficient documentation

## 2012-12-06 DIAGNOSIS — E119 Type 2 diabetes mellitus without complications: Secondary | ICD-10-CM

## 2012-12-06 DIAGNOSIS — E109 Type 1 diabetes mellitus without complications: Secondary | ICD-10-CM

## 2012-12-06 DIAGNOSIS — E039 Hypothyroidism, unspecified: Secondary | ICD-10-CM | POA: Diagnosis not present

## 2012-12-06 DIAGNOSIS — I208 Other forms of angina pectoris: Secondary | ICD-10-CM | POA: Diagnosis not present

## 2012-12-06 DIAGNOSIS — K224 Dyskinesia of esophagus: Secondary | ICD-10-CM

## 2012-12-06 DIAGNOSIS — E785 Hyperlipidemia, unspecified: Secondary | ICD-10-CM

## 2012-12-06 DIAGNOSIS — F329 Major depressive disorder, single episode, unspecified: Secondary | ICD-10-CM

## 2012-12-06 DIAGNOSIS — R0609 Other forms of dyspnea: Secondary | ICD-10-CM | POA: Insufficient documentation

## 2012-12-06 DIAGNOSIS — I10 Essential (primary) hypertension: Secondary | ICD-10-CM | POA: Insufficient documentation

## 2012-12-06 MED ORDER — FUROSEMIDE 40 MG PO TABS: ORAL_TABLET | ORAL | Status: DC

## 2012-12-06 MED ORDER — CITALOPRAM HYDROBROMIDE 40 MG PO TABS: 40.0000 mg | ORAL_TABLET | Freq: Every day | ORAL | Status: DC

## 2012-12-06 MED ORDER — LOSARTAN POTASSIUM-HCTZ 50-12.5 MG PO TABS: 1.0000 | ORAL_TABLET | Freq: Every day | ORAL | Status: DC

## 2012-12-06 MED ORDER — INSULIN NPH (HUMAN) (ISOPHANE) 100 UNIT/ML ~~LOC~~ SUSP: 45.0000 [IU] | SUBCUTANEOUS | Status: DC

## 2012-12-06 MED ORDER — OMEPRAZOLE 40 MG PO CPDR: 40.0000 mg | DELAYED_RELEASE_CAPSULE | Freq: Two times a day (BID) | ORAL | Status: DC

## 2012-12-06 MED ORDER — NITROGLYCERIN 0.4 MG SL SUBL: 0.4000 mg | SUBLINGUAL_TABLET | SUBLINGUAL | Status: DC | PRN

## 2012-12-06 MED ORDER — ATORVASTATIN CALCIUM 20 MG PO TABS: 20.0000 mg | ORAL_TABLET | Freq: Every day | ORAL | Status: DC

## 2012-12-06 MED ORDER — GLIPIZIDE ER 10 MG PO TB24: 10.0000 mg | ORAL_TABLET | Freq: Two times a day (BID) | ORAL | Status: DC

## 2012-12-06 NOTE — Patient Instructions (Signed)
Continue current medications. 

## 2012-12-06 NOTE — Progress Notes (Signed)
Subjective:    Patient ID: [name], female    DOB: 01-13-1941, 72 y.o.   MRN: [id number]  HPI Has been about 2 years since last seen.  Chronic back pains. Seen by Dr. Luiz Blare. Has received injections in back. Last was about 4 weeks ago. Has not had epidural. Had injection in the lumbar. To see Dr. Modesto Charon on 12/12/12. Has DJD of the spine and a bulging disc at L5.  Also had injx of hips for bursitis.  Dr. Luciana Axe told her she did not need surgery on her retina (April 2014).  Glucose has run high as a result of the steroid in jx.   Chronic dysphagia.  Sometimes she has panic attack that makes her feel like her throat ws closing up.  Edema getting worse. Comes and goes.  Has ventral hernia as a result of prior gastric bypass surgery. Has regained 50#.  Dr. Venancio Poisson removed benign lesion from sternal area about 3-4 months ago.    Medication List       This list is accurate as of: 12/06/12 11:59 PM.  Always use your most recent med list.               aspirin 81 MG tablet  Take 81 mg by mouth daily.     atorvastatin 20 MG tablet  Commonly known as:  LIPITOR  Take 1 tablet (20 mg total) by mouth daily. For chloesterol     cetirizine 10 MG tablet  Commonly known as:  ZYRTEC  Take 10 mg by mouth daily. For allergies     citalopram 40 MG tablet  Commonly known as:  CELEXA  Take 1 tablet (40 mg total) by mouth daily.     FLONASE 50 MCG/ACT nasal spray  Generic drug:  fluticasone  1 spray by Nasal route daily as needed. In each nostril     furosemide 40 MG tablet  Commonly known as:  LASIX  One daily if needed for edema.     glipiZIDE 10 MG 24 hr tablet  Commonly known as:  GLUCOTROL XL  Take 1 tablet (10 mg total) by mouth 2 (two) times daily. Take in the morning and evening to control blood sugar     glucose blood test strip  1 each by Other route 3 (three) times daily as needed. Use as instructed     HUMULIN R 100 units/mL injection  Generic drug:  insulin  regular  - Inject into the skin as directed. 0 units if <150  - 1 unit if 151-200  - 2 units if 201-300  - 3 units if 251-300  - 4 units if 301-400  - 5 units if 401 or >     insulin NPH 100 UNIT/ML injection  Commonly known as:  HUMULIN N  Inject 45 Units into the skin as directed. 20 units in the morning and 25 units in the evening to control diabetes.     losartan-hydrochlorothiazide 50-12.5 MG per tablet  Commonly known as:  HYZAAR  Take 1 tablet by mouth daily. To control blood presure     Magnesium Oxide 250 MG Tabs  Take by mouth daily.     nitroGLYCERIN 0.4 MG SL tablet  Commonly known as:  NITROSTAT  Place 1 tablet (0.4 mg total) under the tongue every 5 (five) minutes as needed. For chest pain. Maximum of 3 tablets in 15 minutes.     omeprazole 40 MG capsule  Commonly known as:  PRILOSEC  Take  1 capsule (40 mg total) by mouth 2 (two) times daily. For acid reflux. Cannot use generic     Vitamin D3 5000 UNITS Caps  - Take by mouth daily. For supplement  -          Review of Systems  Constitutional: Positive for fatigue. Negative for fever, diaphoresis and unexpected weight change.       Obese gaining weight. Patient had previous bypass stomach surgery, but has failed to sustain the weight that was lost after surgery.  HENT: Negative.   Eyes: Negative.   Respiratory: Negative.   Cardiovascular: Negative for chest pain, palpitations and leg swelling.  Gastrointestinal: Negative.   Endocrine:       Diabetic.  Genitourinary:       History of vaginal itching post antibiotic treatment. Generally does better with antifungal tablet.  Musculoskeletal:       Complaints of muscular weakness. Degenerative joint disease symptoms are present. He has a history of gout. There is some restriction in joint motion and stiffness present in multiple joints. She has a chronic backache. She is unstable when walking.  Skin:       Dry, itchy skin.  Allergic/Immunologic:  Negative.   Neurological:       Chronic amounts difficulties. Walks wobbly and unsteady. History of headaches including migraine headaches.  Hematological: Negative.   Psychiatric/Behavioral:       Depressive symptoms. History of memory problems. Increased stress. Nervous. Marriage is somewhat rocky.       Objective:BP 140/80  Pulse 93  Temp(Src) 98.5 F (36.9 C) (Oral)  Resp 18  Ht 5\' 2"  (1.575 m)  Wt 204 lb 6.4 oz (92.715 kg)  BMI 37.38 kg/m2  SpO2 98%    Physical Exam  Constitutional: She is oriented to person, place, and time. She appears distressed.  Wheezy and mildly distressed.  HENT:  Head: Normocephalic and atraumatic.  Right Ear: External ear normal.  Left Ear: External ear normal.  Nose: Nose normal.  Mouth/Throat: Oropharynx is clear and moist.  Eyes: Conjunctivae and EOM are normal. Pupils are equal, round, and reactive to light.  Neck: No JVD present. No tracheal deviation present. No thyromegaly present.  Cardiovascular: Normal rate, regular rhythm, normal heart sounds and intact distal pulses.   Pulmonary/Chest: No respiratory distress. She has no wheezes. She has no rales. She exhibits no tenderness.  Abdominal: She exhibits no distension and no mass. There is no tenderness.  Musculoskeletal: She exhibits tenderness. She exhibits no edema.  Unstable gait. Multiple joints are tender but do not appear to be inflamed. Lower back discomfort to palpation.  Lymphadenopathy:    She has no cervical adenopathy.  Neurological: She is alert and oriented to person, place, and time. She has normal reflexes. A cranial nerve deficit is present. Coordination normal.  Skin:  Single, soft, mobile nodule in the right supraclavicular area 6 mm x 10 mm diameter.  Psychiatric: Judgment and thought content normal.  Seems depressed and anxious.      No visits with results within 3 Month(s) from this visit. Latest known visit with results is:  Admission on 08/21/2011,  Discharged on 08/21/2011  Component Date Value Range Status  . Troponin i, poc 08/21/2011 0.00  0.00 - 0.08 ng/mL Final  . Comment 3 08/21/2011          Final   Comment: Due to the release kinetics of cTnI,  a negative result within the first hours                          of the onset of symptoms does not rule out                          myocardial infarction with certainty.                          If myocardial infarction is still suspected,                          repeat the test at appropriate intervals.  . WBC 08/21/2011 6.2  4.0 - 10.5 K/uL Final  . RBC 08/21/2011 4.36  3.87 - 5.11 MIL/uL Final  . Hemoglobin 08/21/2011 12.5  12.0 - 15.0 g/dL Final  . HCT 09/81/1914 39.7  36.0 - 46.0 % Final  . MCV 08/21/2011 91.1  78.0 - 100.0 fL Final  . MCH 08/21/2011 28.7  26.0 - 34.0 pg Final  . MCHC 08/21/2011 31.5  30.0 - 36.0 g/dL Final  . RDW 78/29/5621 14.0  11.5 - 15.5 % Final  . Platelets 08/21/2011 316  150 - 400 K/uL Final  . Neutrophils Relative % 08/21/2011 55  43 - 77 % Final  . Neutro Abs 08/21/2011 3.5  1.7 - 7.7 K/uL Final  . Lymphocytes Relative 08/21/2011 34  12 - 46 % Final  . Lymphs Abs 08/21/2011 2.1  0.7 - 4.0 K/uL Final  . Monocytes Relative 08/21/2011 7  3 - 12 % Final  . Monocytes Absolute 08/21/2011 0.4  0.1 - 1.0 K/uL Final  . Eosinophils Relative 08/21/2011 3  0 - 5 % Final  . Eosinophils Absolute 08/21/2011 0.2  0.0 - 0.7 K/uL Final  . Basophils Relative 08/21/2011 1  0 - 1 % Final  . Basophils Absolute 08/21/2011 0.1  0.0 - 0.1 K/uL Final  . Sodium 08/21/2011 136  135 - 145 mEq/L Final  . Potassium 08/21/2011 3.7  3.5 - 5.1 mEq/L Final  . Chloride 08/21/2011 100  96 - 112 mEq/L Final  . CO2 08/21/2011 28  19 - 32 mEq/L Final  . Glucose, Bld 08/21/2011 100* 70 - 99 mg/dL Final  . BUN 30/86/5784 16  6 - 23 mg/dL Final  . Creatinine, Ser 08/21/2011 0.73  0.50 - 1.10 mg/dL Final  . Calcium 69/62/9528 9.2  8.4 - 10.5 mg/dL Final  .  Total Protein 08/21/2011 6.7  6.0 - 8.3 g/dL Final  . Albumin 41/32/4401 3.6  3.5 - 5.2 g/dL Final  . AST 02/72/5366 18  0 - 37 U/L Final  . ALT 08/21/2011 15  0 - 35 U/L Final  . Alkaline Phosphatase 08/21/2011 89  39 - 117 U/L Final  . Total Bilirubin 08/21/2011 0.5  0.3 - 1.2 mg/dL Final  . GFR calc non Af Amer 08/21/2011 83* >90 mL/min Final  . GFR calc Af Amer 08/21/2011 >90  >90 mL/min Final   Comment:                                 The eGFR has been calculated  using the CKD EPI equation.                          This calculation has not been                          validated in all clinical                          situations.                          eGFR's persistently                          <90 mL/min signify                          possible Chronic Kidney Disease.  . Glucose-Capillary 08/21/2011 60* 70 - 99 mg/dL Final  . Glucose-Capillary 08/21/2011 83  70 - 99 mg/dL Final  . Comment 1 25/36/6440 Notify RN   Final  . Comment 2 08/21/2011 Documented in Chart   Final  . Troponin I 08/21/2011 <0.30  <0.30 ng/mL Final   Comment:                                 Due to the release kinetics of cTnI,                          a negative result within the first hours                          of the onset of symptoms does not rule out                          myocardial infarction with certainty.                          If myocardial infarction is still suspected,                          repeat the test at appropriate intervals.  . Glucose-Capillary 08/21/2011 57* 70 - 99 mg/dL Final  . Comment 1 34/74/2595 Notify RN   Final  . Comment 2 08/21/2011 Documented in Chart   Final  . Glucose-Capillary 08/21/2011 84  70 - 99 mg/dL Final  . Comment 1 63/87/5643 Notify RN   Final  . Comment 2 08/21/2011 Documented in Chart   Final   11/21/2012 BMP: Glucose 203, BUN 22, creatinine 0.9  Vitamin D 36.8     Assessment & Plan:  Type II or unspecified type  diabetes mellitus without mention of complication, not stated as uncontrolled: Recent labs suggest that her control is not as good as in the past she is on both insulin injections as well as glipizide orally. She is gaining weight.   - Plan: Discussed dietary compliance. She will continue insulin NPH (HUMULIN N) 100 UNIT/ML injection, glipiZIDE (GLUCOTROL XL) 10 MG 24 hr tablet  Hypertension: Controlled  Unspecified hypothyroidism: Is really of elevated TSH. Is not on supplements.   - Plan: TSH  Other and unspecified hyperlipidemia: Controlled   - Plan: atorvastatin (LIPITOR) 20 MG tablet, Lipid panel  Depressive disorder, not elsewhere classified: Taking citalopram 40 mg daily  Pain in lower back: Chronic. Variable intensity but it is there most of the time.  Edema: Fluctuating swelling of the ankles and feet.  Angina decubitus: Occasional chest discomfort that responds to nitroglycerin   - Plan: nitroGLYCERIN (NITROSTAT) 0.4 MG SL tablet  Esophageal spasm:   - Plan: omeprazole (PRILOSEC) 40 MG capsule

## 2012-12-12 DIAGNOSIS — M545 Low back pain: Secondary | ICD-10-CM | POA: Diagnosis not present

## 2012-12-27 DIAGNOSIS — M47817 Spondylosis without myelopathy or radiculopathy, lumbosacral region: Secondary | ICD-10-CM | POA: Diagnosis not present

## 2012-12-27 DIAGNOSIS — M76899 Other specified enthesopathies of unspecified lower limb, excluding foot: Secondary | ICD-10-CM | POA: Diagnosis not present

## 2013-01-27 DIAGNOSIS — M47817 Spondylosis without myelopathy or radiculopathy, lumbosacral region: Secondary | ICD-10-CM | POA: Diagnosis not present

## 2013-01-27 DIAGNOSIS — E1121 Type 2 diabetes mellitus with diabetic nephropathy: Secondary | ICD-10-CM | POA: Insufficient documentation

## 2013-01-27 DIAGNOSIS — E785 Hyperlipidemia, unspecified: Secondary | ICD-10-CM | POA: Insufficient documentation

## 2013-01-27 DIAGNOSIS — E119 Type 2 diabetes mellitus without complications: Secondary | ICD-10-CM | POA: Insufficient documentation

## 2013-02-16 DIAGNOSIS — M47817 Spondylosis without myelopathy or radiculopathy, lumbosacral region: Secondary | ICD-10-CM | POA: Diagnosis not present

## 2013-02-22 DIAGNOSIS — M545 Low back pain: Secondary | ICD-10-CM | POA: Diagnosis not present

## 2013-03-14 DIAGNOSIS — N058 Unspecified nephritic syndrome with other morphologic changes: Secondary | ICD-10-CM | POA: Diagnosis not present

## 2013-03-14 DIAGNOSIS — R413 Other amnesia: Secondary | ICD-10-CM | POA: Diagnosis not present

## 2013-03-14 DIAGNOSIS — R42 Dizziness and giddiness: Secondary | ICD-10-CM | POA: Diagnosis not present

## 2013-03-14 DIAGNOSIS — D518 Other vitamin B12 deficiency anemias: Secondary | ICD-10-CM | POA: Diagnosis not present

## 2013-03-14 DIAGNOSIS — E559 Vitamin D deficiency, unspecified: Secondary | ICD-10-CM | POA: Diagnosis not present

## 2013-03-14 DIAGNOSIS — IMO0001 Reserved for inherently not codable concepts without codable children: Secondary | ICD-10-CM | POA: Diagnosis not present

## 2013-03-14 DIAGNOSIS — D510 Vitamin B12 deficiency anemia due to intrinsic factor deficiency: Secondary | ICD-10-CM | POA: Diagnosis not present

## 2013-03-14 DIAGNOSIS — I2510 Atherosclerotic heart disease of native coronary artery without angina pectoris: Secondary | ICD-10-CM | POA: Diagnosis not present

## 2013-03-14 DIAGNOSIS — E1129 Type 2 diabetes mellitus with other diabetic kidney complication: Secondary | ICD-10-CM | POA: Diagnosis not present

## 2013-03-16 DIAGNOSIS — R5381 Other malaise: Secondary | ICD-10-CM | POA: Diagnosis not present

## 2013-04-03 DIAGNOSIS — H35049 Retinal micro-aneurysms, unspecified, unspecified eye: Secondary | ICD-10-CM | POA: Diagnosis not present

## 2013-04-03 DIAGNOSIS — E1139 Type 2 diabetes mellitus with other diabetic ophthalmic complication: Secondary | ICD-10-CM | POA: Diagnosis not present

## 2013-04-03 DIAGNOSIS — H35379 Puckering of macula, unspecified eye: Secondary | ICD-10-CM | POA: Diagnosis not present

## 2013-04-03 DIAGNOSIS — E11339 Type 2 diabetes mellitus with moderate nonproliferative diabetic retinopathy without macular edema: Secondary | ICD-10-CM | POA: Diagnosis not present

## 2013-04-03 DIAGNOSIS — H35359 Cystoid macular degeneration, unspecified eye: Secondary | ICD-10-CM | POA: Diagnosis not present

## 2013-04-05 ENCOUNTER — Other Ambulatory Visit: Payer: Self-pay | Admitting: Internal Medicine

## 2013-04-16 ENCOUNTER — Encounter (HOSPITAL_COMMUNITY): Payer: Self-pay | Admitting: Emergency Medicine

## 2013-04-16 ENCOUNTER — Emergency Department (HOSPITAL_COMMUNITY)
Admission: EM | Admit: 2013-04-16 | Discharge: 2013-04-16 | Disposition: A | Discharge status: Home / Self Care | Payer: Medicare Other | Attending: Emergency Medicine | Admitting: Emergency Medicine

## 2013-04-16 DIAGNOSIS — F411 Generalized anxiety disorder: Secondary | ICD-10-CM | POA: Insufficient documentation

## 2013-04-16 DIAGNOSIS — Z8719 Personal history of other diseases of the digestive system: Secondary | ICD-10-CM | POA: Diagnosis not present

## 2013-04-16 DIAGNOSIS — Z7982 Long term (current) use of aspirin: Secondary | ICD-10-CM | POA: Diagnosis not present

## 2013-04-16 DIAGNOSIS — F3289 Other specified depressive episodes: Secondary | ICD-10-CM | POA: Insufficient documentation

## 2013-04-16 DIAGNOSIS — Z8742 Personal history of other diseases of the female genital tract: Secondary | ICD-10-CM | POA: Insufficient documentation

## 2013-04-16 DIAGNOSIS — J4489 Other specified chronic obstructive pulmonary disease: Secondary | ICD-10-CM | POA: Insufficient documentation

## 2013-04-16 DIAGNOSIS — J3489 Other specified disorders of nose and nasal sinuses: Secondary | ICD-10-CM | POA: Diagnosis not present

## 2013-04-16 DIAGNOSIS — J449 Chronic obstructive pulmonary disease, unspecified: Secondary | ICD-10-CM | POA: Insufficient documentation

## 2013-04-16 DIAGNOSIS — I10 Essential (primary) hypertension: Secondary | ICD-10-CM | POA: Diagnosis not present

## 2013-04-16 DIAGNOSIS — Z8739 Personal history of other diseases of the musculoskeletal system and connective tissue: Secondary | ICD-10-CM | POA: Insufficient documentation

## 2013-04-16 DIAGNOSIS — R079 Chest pain, unspecified: Secondary | ICD-10-CM | POA: Diagnosis not present

## 2013-04-16 DIAGNOSIS — R110 Nausea: Secondary | ICD-10-CM | POA: Insufficient documentation

## 2013-04-16 DIAGNOSIS — N390 Urinary tract infection, site not specified: Secondary | ICD-10-CM | POA: Insufficient documentation

## 2013-04-16 DIAGNOSIS — Z794 Long term (current) use of insulin: Secondary | ICD-10-CM | POA: Insufficient documentation

## 2013-04-16 DIAGNOSIS — E1065 Type 1 diabetes mellitus with hyperglycemia: Secondary | ICD-10-CM | POA: Diagnosis not present

## 2013-04-16 DIAGNOSIS — R0602 Shortness of breath: Secondary | ICD-10-CM | POA: Diagnosis not present

## 2013-04-16 DIAGNOSIS — E785 Hyperlipidemia, unspecified: Secondary | ICD-10-CM | POA: Diagnosis not present

## 2013-04-16 DIAGNOSIS — IMO0002 Reserved for concepts with insufficient information to code with codable children: Secondary | ICD-10-CM | POA: Diagnosis not present

## 2013-04-16 DIAGNOSIS — E669 Obesity, unspecified: Secondary | ICD-10-CM | POA: Insufficient documentation

## 2013-04-16 DIAGNOSIS — R42 Dizziness and giddiness: Secondary | ICD-10-CM | POA: Diagnosis not present

## 2013-04-16 DIAGNOSIS — E7800 Pure hypercholesterolemia, unspecified: Secondary | ICD-10-CM | POA: Diagnosis not present

## 2013-04-16 DIAGNOSIS — E559 Vitamin D deficiency, unspecified: Secondary | ICD-10-CM | POA: Diagnosis not present

## 2013-04-16 DIAGNOSIS — F329 Major depressive disorder, single episode, unspecified: Secondary | ICD-10-CM | POA: Diagnosis not present

## 2013-04-16 DIAGNOSIS — H9319 Tinnitus, unspecified ear: Secondary | ICD-10-CM | POA: Diagnosis not present

## 2013-04-16 DIAGNOSIS — Z79899 Other long term (current) drug therapy: Secondary | ICD-10-CM | POA: Diagnosis not present

## 2013-04-16 LAB — BASIC METABOLIC PANEL
Chloride: 98 mEq/L (ref 96–112)
GFR calc Af Amer: >90 mL/min (ref >90)
Potassium: 4.1 mEq/L (ref 3.5–5.1)

## 2013-04-16 LAB — URINALYSIS, ROUTINE W REFLEX MICROSCOPIC
Glucose, UA: NEGATIVE mg/dL
Protein, ur: 100 mg/dL — AB
Urobilinogen, UA: 1 mg/dL (ref 0.0–1.0)

## 2013-04-16 LAB — CBC
HCT: 38.6 % (ref 36.0–46.0)
Platelets: 333 10*3/uL (ref 150–400)
RDW: 15 % (ref 11.5–15.5)
WBC: 11.3 10*3/uL — ABNORMAL HIGH (ref 4.0–10.5)

## 2013-04-16 LAB — URINE MICROSCOPIC-ADD ON

## 2013-04-16 MED ORDER — CIPROFLOXACIN HCL 500 MG PO TABS: 500.0000 mg | ORAL_TABLET | Freq: Two times a day (BID) | ORAL | Status: DC

## 2013-04-16 MED ORDER — SULFAMETHOXAZOLE-TMP DS 800-160 MG PO TABS
1.0000 | ORAL_TABLET | Freq: Once | ORAL | Status: DC
  Filled 2013-04-16: qty 1

## 2013-04-16 MED ORDER — SULFAMETHOXAZOLE-TRIMETHOPRIM 800-160 MG PO TABS: 1.0000 | ORAL_TABLET | Freq: Two times a day (BID) | ORAL | Status: DC

## 2013-04-16 MED ORDER — MECLIZINE HCL 25 MG PO TABS: 25.0000 mg | ORAL_TABLET | Freq: Three times a day (TID) | ORAL | Status: DC | PRN

## 2013-04-16 MED ORDER — CIPROFLOXACIN HCL 500 MG PO TABS: 500.0000 mg | ORAL_TABLET | Freq: Once | ORAL | Status: DC

## 2013-04-16 MED ORDER — CIPROFLOXACIN HCL 500 MG PO TABS
500.0000 mg | ORAL_TABLET | Freq: Once | ORAL | Status: AC
Start: 2013-04-16 — End: 2013-04-16
  Administered 2013-04-16: 500 mg via ORAL
  Filled 2013-04-16: qty 1

## 2013-04-16 NOTE — ED Provider Notes (Signed)
Medical screening examination/treatment/procedure(s) were conducted as a shared visit with non-physician practitioner(s) and myself.  I personally evaluated the patient during the encounter.  EKG Interpretation   None         Candyce Churn, MD 04/16/13 2010

## 2013-04-16 NOTE — ED Notes (Signed)
Discharged by wheelchair Leonette Most  NT assisted

## 2013-04-16 NOTE — ED Provider Notes (Signed)
CSN: [id number]     Arrival date & time 04/16/13  1701 History   First MD Initiated Contact with Patient 04/16/13 1725     Chief Complaint  Patient presents with  . Hematuria   (Consider location/radiation/quality/duration/timing/severity/associated sxs/prior Treatment) HPI Comments: Patient is a 72 year old female with an extensive past medical history including hypertension, diabetes and vertigo among multiple other medical problems who presents to the emergency department complaining of increased urinary frequency, urgency and dysuria x4 days. Today she noticed blood in her urine. States she tried to drink cranberry juice without any relief. She has mild lower abdominal pain and nausea. Denies fever, chills, vomiting, back pain. She is also complaining of a "buzz" in the Center of her head that she notices when she looks towards the right or the left. 5 weeks ago she was treated for vertigo by her PCP with meclizine, she stopped taking this about 2 weeks ago, "buzzing" began 4 days ago. She was also prescribed Flonase for sinus congestion which she feels she is still slightly congested. Her PCP did not actually evaluate her for this, he called a prescription for this over the phone. Admits to slight dizziness. Denies vision changes, confusion.  Patient is a 72 y.o. female presenting with hematuria. The history is provided by the patient.  Hematuria Associated symptoms include nausea.    Past Medical History  Diagnosis Date  . Hypertension   . Diabetes mellitus   . High cholesterol   . Other specified disease of sebaceous glands   . Other B-complex deficiencies   . Depressive disorder, not elsewhere classified   . Tension headache   . Migraine without aura, with intractable migraine, so stated, with status migrainosus   . Type I (juvenile type) diabetes mellitus without mention of complication, uncontrolled   . Unspecified vitamin D deficiency   . Dyskinesia of esophagus   . Memory  loss   . Mild cognitive impairment, so stated   . Other nonspecific abnormal serum enzyme levels   . Lipoma of other skin and subcutaneous tissue   . Pain in joint, shoulder region   . Nonspecific (abnormal) findings on radiological and other examination of abdominal area, including retroperitoneum   . Nonspecific abnormal results of liver function study   . Pain in joint, pelvic region and thigh   . Pain in joint, ankle and foot   . Edema   . Lumbago   . Other symptoms involving cardiovascular system   . Abdominal pain, unspecified site   . Other specified cardiac dysrhythmias(427.89)   . Ventral hernia, unspecified, without mention of obstruction or gangrene   . Extrinsic asthma, unspecified   . Dizziness and giddiness   . Palpitations   . Shortness of breath   . Reflux esophagitis   . Obesity, unspecified   . Unspecified hypothyroidism   . Type I (juvenile type) diabetes mellitus without mention of complication, not stated as uncontrolled   . Other and unspecified hyperlipidemia   . Gout, unspecified   . Unspecified essential hypertension   . Encounter for long-term (current) use of other medications   . Abnormality of gait   . Chest pain, unspecified   . Obstructive chronic bronchitis with exacerbation   . Female stress incontinence   . Apnea   . Cardiomegaly    Past Surgical History  Procedure Laterality Date  . Stents      last time she said was 1 yr ago   . Breast surgery  Family History  Problem Relation Age of Onset  . Stroke Mother   . Stroke Father   . Diabetes Father   . Heart disease Father   . Diabetes Son   . Cancer Brother     BLADDER  . Diabetes Brother    History  Substance Use Topics  . Smoking status: Never Smoker   . Smokeless tobacco: Not on file  . Alcohol Use: No   OB History   Grav Para Term Preterm Abortions TAB SAB Ect Mult Living                 Review of Systems  Gastrointestinal: Positive for nausea.  Genitourinary:  Positive for dysuria, urgency, frequency and hematuria.  Neurological:       Positive for "buzzing" in her head.  All other systems reviewed and are negative.    Allergies  Advair diskus; Albuterol; Alendronate sodium; Benadryl; Benzonatate; Cephalexin; Gabapentin; Iohexol; Keflex; Morphine; Other; Propoxyphene hcl; Reclast; Avapro; Codeine; Tessalon perles; and Tramadol  Home Medications   Current Outpatient Rx  Name  Route  Sig  Dispense  Refill  . aspirin 81 MG tablet   Oral   Take 81 mg by mouth daily.           Marland Kitchen atorvastatin (LIPITOR) 20 MG tablet   Oral   Take 1 tablet (20 mg total) by mouth daily. For chloesterol   90 tablet   4   . cetirizine (ZYRTEC) 10 MG tablet   Oral   Take 10 mg by mouth daily. For allergies          . Cholecalciferol (VITAMIN D3) 5000 UNITS CAPS   Oral   Take by mouth daily. For supplement           . citalopram (CELEXA) 40 MG tablet   Oral   Take 1 tablet (40 mg total) by mouth daily.   90 tablet   3   . fluticasone (FLONASE) 50 MCG/ACT nasal spray   Nasal   1 spray by Nasal route daily as needed. In each nostril          . furosemide (LASIX) 40 MG tablet      One daily if needed for edema.   30 tablet   3   . glipiZIDE (GLUCOTROL XL) 10 MG 24 hr tablet   Oral   Take 1 tablet (10 mg total) by mouth 2 (two) times daily. Take in the morning and evening to control blood sugar   180 tablet   4   . glucose blood test strip   Other   1 each by Other route 3 (three) times daily as needed. Use as instructed          . insulin NPH (HUMULIN N) 100 UNIT/ML injection   Subcutaneous   Inject 45 Units into the skin as directed. 20 units in the morning and 25 units in the evening to control diabetes.   5 vial   4   . insulin regular (HUMULIN R) 100 UNIT/ML injection   Subcutaneous   Inject into the skin as directed. 0 units if <150 1 unit if 151-200 2 units if 201-300 3 units if 251-300 4 units if 301-400 5 units if  401 or >          . losartan-hydrochlorothiazide (HYZAAR) 50-12.5 MG per tablet   Oral   Take 1 tablet by mouth daily. To control blood presure   90 tablet   3   .  Magnesium Oxide 250 MG TABS   Oral   Take by mouth daily.           . nitroGLYCERIN (NITROSTAT) 0.4 MG SL tablet   Sublingual   Place 1 tablet (0.4 mg total) under the tongue every 5 (five) minutes as needed. For chest pain. Maximum of 3 tablets in 15 minutes.   25 tablet   4   . PRILOSEC 40 MG capsule      TAKE 1 CAPSULE TWICE DAILY FOR ACID REFLUX   180 capsule   2    BP 156/96  Pulse 100  Temp(Src) 98.5 F (36.9 C) (Oral)  Resp 18  SpO2 99% Physical Exam  Nursing note and vitals reviewed. Constitutional: She is oriented to person, place, and time. She appears well-developed and well-nourished. No distress.  HENT:  Head: Normocephalic and atraumatic.  Right Ear: Hearing, tympanic membrane and ear canal normal.  Left Ear: Hearing, tympanic membrane and ear canal normal.  Nose: Right sinus exhibits maxillary sinus tenderness and frontal sinus tenderness. Left sinus exhibits maxillary sinus tenderness and frontal sinus tenderness.  Mouth/Throat: Oropharynx is clear and moist.  Eyes: Conjunctivae and EOM are normal. Pupils are equal, round, and reactive to light.  Neck: Normal range of motion. Neck supple.  Cardiovascular: Normal rate, regular rhythm and normal heart sounds.   Pulmonary/Chest: Effort normal and breath sounds normal.  Abdominal: Soft. Bowel sounds are normal. There is tenderness in the suprapubic area. There is no rigidity, no rebound, no guarding and no CVA tenderness.  No peritoneal signs.  Musculoskeletal: Normal range of motion. She exhibits no edema.  Lymphadenopathy:    She has no cervical adenopathy.  Neurological: She is alert and oriented to person, place, and time. She has normal strength. No cranial nerve deficit or sensory deficit. She displays a negative Romberg sign.  Coordination normal. GCS eye subscore is 4. GCS verbal subscore is 5. GCS motor subscore is 6.  "Buzzing" exacerbated by looking right or left.  Skin: Skin is warm and dry. She is not diaphoretic.  Psychiatric: Her behavior is normal. Her mood appears anxious.    ED Course  Procedures (including critical care time) Labs Review Labs Reviewed  URINALYSIS, ROUTINE W REFLEX MICROSCOPIC - Abnormal; Notable for the following:    APPearance CLOUDY (*)    Hgb urine dipstick LARGE (*)    Protein, ur 100 (*)    Nitrite POSITIVE (*)    Leukocytes, UA LARGE (*)    All other components within normal limits  CBC - Abnormal; Notable for the following:    WBC 11.3 (*)    All other components within normal limits  BASIC METABOLIC PANEL - Abnormal; Notable for the following:    Glucose, Bld 332 (*)    GFR calc non Af Amer 82 (*)    All other components within normal limits  URINE MICROSCOPIC-ADD ON - Abnormal; Notable for the following:    Bacteria, UA FEW (*)    All other components within normal limits  URINE CULTURE   Imaging Review No results found.  EKG Interpretation   None       MDM   1. Urinary tract infection     Patient with UTI- afebrile and in NAD. Normal vitals on my exam- no tachycardia. Treat with cipro (allergy to keflex). Regarding "buzzing", she has a normal neuro exam, able to ambulate. Advised to use meclizine if she gets the symptoms and discuss this with PCP. Probably vestibular in  nature. No headache, tinnitus. Case discussed with attending Dr. Loretha Stapler who also evaluated patient and agrees with plan of care.     Trevor Mace, PA-C 04/16/13 1958  Trevor Mace, PA-C 04/16/13 2000

## 2013-04-16 NOTE — ED Notes (Signed)
Pt c/o blood in urine with pain.pt also sts she has a buzzing in her head

## 2013-04-16 NOTE — ED Provider Notes (Signed)
Medical screening examination/treatment/procedure(s) were conducted as a shared visit with non-physician practitioner(s) and myself.  I personally evaluated the patient during the encounter.  EKG Interpretation   None       72 yo female presenting with dysuria and an intermittent buzzing feeling in her head.  She has UTI on UA.  The buzzing in her head is atypical, sometimes occuring with head movement but not always.  Sometimes occurs spontaneously.  Lasts only a brief period of time . She has been suffering with vertigo recently, but not currently.  She has had these same symptoms in the past.  On exam, well appearing, NAD, nontoxic,  normal neurologic exam, limping but steady gait, no carotid bruits, no numbness or tingling in hands, no neck pain.  Plan treatment of UTI and follow up with PCP.    Clinical Impression: 1. Urinary tract infection       Candyce Churn, MD 04/16/13 2009

## 2013-04-16 NOTE — ED Notes (Addendum)
Pt c/o of "buzzing sound in head as she turned her head not in her ears since Friday" EDPA Robyn aware of assessment. Denies blurred vision, headache, neuro changes.  Pt negative for stroke. Stroke swallow performed GCS 15 PEERL.

## 2013-04-17 ENCOUNTER — Emergency Department (HOSPITAL_COMMUNITY): Payer: Medicare Other

## 2013-04-17 ENCOUNTER — Other Ambulatory Visit: Payer: Self-pay | Admitting: *Deleted

## 2013-04-17 ENCOUNTER — Observation Stay (HOSPITAL_COMMUNITY)
Admission: EM | Admit: 2013-04-17 | Discharge: 2013-04-19 | Disposition: A | Discharge status: Home / Self Care | Payer: Medicare Other | Attending: Endocrinology | Admitting: Endocrinology

## 2013-04-17 ENCOUNTER — Encounter (HOSPITAL_COMMUNITY): Payer: Self-pay | Admitting: Emergency Medicine

## 2013-04-17 DIAGNOSIS — E559 Vitamin D deficiency, unspecified: Secondary | ICD-10-CM | POA: Diagnosis not present

## 2013-04-17 DIAGNOSIS — N390 Urinary tract infection, site not specified: Secondary | ICD-10-CM | POA: Diagnosis not present

## 2013-04-17 DIAGNOSIS — R269 Unspecified abnormalities of gait and mobility: Secondary | ICD-10-CM | POA: Insufficient documentation

## 2013-04-17 DIAGNOSIS — R413 Other amnesia: Secondary | ICD-10-CM | POA: Diagnosis present

## 2013-04-17 DIAGNOSIS — I10 Essential (primary) hypertension: Secondary | ICD-10-CM | POA: Diagnosis not present

## 2013-04-17 DIAGNOSIS — J45909 Unspecified asthma, uncomplicated: Secondary | ICD-10-CM | POA: Diagnosis present

## 2013-04-17 DIAGNOSIS — M109 Gout, unspecified: Secondary | ICD-10-CM | POA: Diagnosis not present

## 2013-04-17 DIAGNOSIS — E1165 Type 2 diabetes mellitus with hyperglycemia: Secondary | ICD-10-CM

## 2013-04-17 DIAGNOSIS — K219 Gastro-esophageal reflux disease without esophagitis: Secondary | ICD-10-CM | POA: Diagnosis not present

## 2013-04-17 DIAGNOSIS — E119 Type 2 diabetes mellitus without complications: Secondary | ICD-10-CM | POA: Insufficient documentation

## 2013-04-17 DIAGNOSIS — Z7982 Long term (current) use of aspirin: Secondary | ICD-10-CM | POA: Diagnosis not present

## 2013-04-17 DIAGNOSIS — N289 Disorder of kidney and ureter, unspecified: Secondary | ICD-10-CM | POA: Diagnosis not present

## 2013-04-17 DIAGNOSIS — R079 Chest pain, unspecified: Secondary | ICD-10-CM | POA: Diagnosis not present

## 2013-04-17 DIAGNOSIS — Z79899 Other long term (current) drug therapy: Secondary | ICD-10-CM | POA: Diagnosis not present

## 2013-04-17 DIAGNOSIS — R0789 Other chest pain: Secondary | ICD-10-CM | POA: Diagnosis not present

## 2013-04-17 DIAGNOSIS — H3500 Unspecified background retinopathy: Secondary | ICD-10-CM | POA: Insufficient documentation

## 2013-04-17 DIAGNOSIS — E785 Hyperlipidemia, unspecified: Secondary | ICD-10-CM | POA: Diagnosis not present

## 2013-04-17 DIAGNOSIS — F32A Depression, unspecified: Secondary | ICD-10-CM | POA: Diagnosis present

## 2013-04-17 DIAGNOSIS — F329 Major depressive disorder, single episode, unspecified: Secondary | ICD-10-CM | POA: Insufficient documentation

## 2013-04-17 DIAGNOSIS — R0602 Shortness of breath: Secondary | ICD-10-CM | POA: Diagnosis not present

## 2013-04-17 DIAGNOSIS — I2510 Atherosclerotic heart disease of native coronary artery without angina pectoris: Secondary | ICD-10-CM

## 2013-04-17 DIAGNOSIS — Z9884 Bariatric surgery status: Secondary | ICD-10-CM | POA: Insufficient documentation

## 2013-04-17 DIAGNOSIS — G4730 Sleep apnea, unspecified: Secondary | ICD-10-CM | POA: Diagnosis not present

## 2013-04-17 DIAGNOSIS — N059 Unspecified nephritic syndrome with unspecified morphologic changes: Secondary | ICD-10-CM | POA: Diagnosis not present

## 2013-04-17 DIAGNOSIS — IMO0002 Reserved for concepts with insufficient information to code with codable children: Secondary | ICD-10-CM

## 2013-04-17 DIAGNOSIS — T50905A Adverse effect of unspecified drugs, medicaments and biological substances, initial encounter: Secondary | ICD-10-CM

## 2013-04-17 DIAGNOSIS — E039 Hypothyroidism, unspecified: Secondary | ICD-10-CM | POA: Diagnosis not present

## 2013-04-17 DIAGNOSIS — F3289 Other specified depressive episodes: Secondary | ICD-10-CM | POA: Insufficient documentation

## 2013-04-17 DIAGNOSIS — R2681 Unsteadiness on feet: Secondary | ICD-10-CM | POA: Diagnosis present

## 2013-04-17 DIAGNOSIS — T368X5A Adverse effect of other systemic antibiotics, initial encounter: Secondary | ICD-10-CM | POA: Insufficient documentation

## 2013-04-17 DIAGNOSIS — E1121 Type 2 diabetes mellitus with diabetic nephropathy: Secondary | ICD-10-CM | POA: Diagnosis present

## 2013-04-17 HISTORY — DX: Other complications of anesthesia, initial encounter: T88.59XA

## 2013-04-17 HISTORY — DX: Adverse effect of unspecified anesthetic, initial encounter: T41.45XA

## 2013-04-17 LAB — TROPONIN I
Troponin I: <0.3 ng/mL (ref <0.30)
Troponin I: <0.3 ng/mL (ref <0.30)

## 2013-04-17 LAB — CBC WITH DIFFERENTIAL/PLATELET
Basophils Relative: 0 % (ref 0–1)
Eosinophils Absolute: 0.1 10*3/uL (ref 0.0–0.7)
HCT: 37.1 % (ref 36.0–46.0)
Hemoglobin: 11.7 g/dL — ABNORMAL LOW (ref 12.0–15.0)
Lymphs Abs: 1.9 10*3/uL (ref 0.7–4.0)
MCH: 27.3 pg (ref 26.0–34.0)
MCHC: 31.5 g/dL (ref 30.0–36.0)
MCV: 86.5 fL (ref 78.0–100.0)
Monocytes Absolute: 0.4 10*3/uL (ref 0.1–1.0)
Monocytes Relative: 4 % (ref 3–12)
Neutro Abs: 6.2 10*3/uL (ref 1.7–7.7)
Neutrophils Relative %: 73 % (ref 43–77)

## 2013-04-17 LAB — POCT I-STAT, CHEM 8
Calcium, Ion: 1.21 mmol/L (ref 1.13–1.30)
Glucose, Bld: 305 mg/dL — ABNORMAL HIGH (ref 70–99)
HCT: 39 % (ref 36.0–46.0)
Hemoglobin: 13.3 g/dL (ref 12.0–15.0)

## 2013-04-17 LAB — HEMOGLOBIN A1C: Hgb A1c MFr Bld: 8.1 % — ABNORMAL HIGH (ref <5.7)

## 2013-04-17 MED ORDER — NITROGLYCERIN 0.4 MG SL SUBL: 0.4000 mg | SUBLINGUAL_TABLET | SUBLINGUAL | Status: DC | PRN

## 2013-04-17 MED ORDER — HYDROCHLOROTHIAZIDE 12.5 MG PO CAPS
12.5000 mg | ORAL_CAPSULE | Freq: Every day | ORAL | Status: DC
  Administered 2013-04-18 – 2013-04-19 (×2): 12.5 mg via ORAL
  Filled 2013-04-17 (×2): qty 1

## 2013-04-17 MED ORDER — LOSARTAN POTASSIUM-HCTZ 50-12.5 MG PO TABS: 1.0000 | ORAL_TABLET | Freq: Every day | ORAL | Status: DC

## 2013-04-17 MED ORDER — DIPHENHYDRAMINE HCL 25 MG PO CAPS
25.0000 mg | ORAL_CAPSULE | Freq: Two times a day (BID) | ORAL | Status: DC
  Administered 2013-04-17 – 2013-04-19 (×4): 25 mg via ORAL
  Filled 2013-04-17 (×6): qty 1

## 2013-04-17 MED ORDER — INSULIN ASPART 100 UNIT/ML ~~LOC~~ SOLN
0.0000 [IU] | Freq: Three times a day (TID) | SUBCUTANEOUS | Status: DC
  Administered 2013-04-17: 17:00:00 3 [IU] via SUBCUTANEOUS
  Administered 2013-04-18: 5 [IU] via SUBCUTANEOUS
  Administered 2013-04-19: 2 [IU] via SUBCUTANEOUS

## 2013-04-17 MED ORDER — CITALOPRAM HYDROBROMIDE 40 MG PO TABS
40.0000 mg | ORAL_TABLET | Freq: Every day | ORAL | Status: DC
  Administered 2013-04-18 – 2013-04-19 (×2): 40 mg via ORAL
  Filled 2013-04-17 (×2): qty 1

## 2013-04-17 MED ORDER — SODIUM CHLORIDE 0.9 % IV SOLN
INTRAVENOUS | Status: DC
  Administered 2013-04-17 (×3): via INTRAVENOUS
  Administered 2013-04-18: 125 mL/h via INTRAVENOUS
  Administered 2013-04-18 – 2013-04-19 (×2): via INTRAVENOUS

## 2013-04-17 MED ORDER — ENOXAPARIN SODIUM 40 MG/0.4ML ~~LOC~~ SOLN
40.0000 mg | SUBCUTANEOUS | Status: DC
  Administered 2013-04-17 – 2013-04-18 (×2): 40 mg via SUBCUTANEOUS
  Filled 2013-04-17 (×3): qty 0.4

## 2013-04-17 MED ORDER — PANTOPRAZOLE SODIUM 40 MG PO TBEC
40.0000 mg | DELAYED_RELEASE_TABLET | Freq: Every day | ORAL | Status: DC
  Administered 2013-04-18 – 2013-04-19 (×2): 40 mg via ORAL
  Filled 2013-04-17 (×2): qty 1

## 2013-04-17 MED ORDER — LOSARTAN POTASSIUM 50 MG PO TABS
50.0000 mg | ORAL_TABLET | Freq: Every day | ORAL | Status: DC
  Administered 2013-04-18 – 2013-04-19 (×2): 50 mg via ORAL
  Filled 2013-04-17 (×2): qty 1

## 2013-04-17 MED ORDER — VITAMIN D3 25 MCG (1000 UNIT) PO TABS
5000.0000 [IU] | ORAL_TABLET | Freq: Every day | ORAL | Status: DC
  Administered 2013-04-18 – 2013-04-19 (×2): 5000 [IU] via ORAL
  Filled 2013-04-17 (×2): qty 5

## 2013-04-17 MED ORDER — FLUTICASONE PROPIONATE 50 MCG/ACT NA SUSP
1.0000 | Freq: Every day | NASAL | Status: DC | PRN
  Filled 2013-04-17: qty 16

## 2013-04-17 MED ORDER — INSULIN ASPART 100 UNIT/ML ~~LOC~~ SOLN
3.0000 [IU] | Freq: Three times a day (TID) | SUBCUTANEOUS | Status: DC
  Administered 2013-04-17 – 2013-04-19 (×4): 3 [IU] via SUBCUTANEOUS

## 2013-04-17 MED ORDER — LORATADINE 10 MG PO TABS
10.0000 mg | ORAL_TABLET | Freq: Every day | ORAL | Status: DC
  Administered 2013-04-17 – 2013-04-19 (×3): 10 mg via ORAL
  Filled 2013-04-17 (×3): qty 1

## 2013-04-17 MED ORDER — ASPIRIN 81 MG PO CHEW
81.0000 mg | CHEWABLE_TABLET | Freq: Every day | ORAL | Status: DC
Start: 2013-04-18 — End: 2013-04-19
  Administered 2013-04-18 – 2013-04-19 (×2): 81 mg via ORAL
  Filled 2013-04-17 (×2): qty 1

## 2013-04-17 MED ORDER — VITAMIN D3 125 MCG (5000 UT) PO CAPS: 1.0000 | ORAL_CAPSULE | Freq: Every day | ORAL | Status: DC

## 2013-04-17 MED ORDER — INSULIN ASPART 100 UNIT/ML ~~LOC~~ SOLN: 0.0000 [IU] | Freq: Every day | SUBCUTANEOUS | Status: DC

## 2013-04-17 MED ORDER — CEFTRIAXONE SODIUM 1 G IJ SOLR
1.0000 g | Freq: Once | INTRAMUSCULAR | Status: AC
  Administered 2013-04-17: 1 g via INTRAVENOUS
  Filled 2013-04-17: qty 10

## 2013-04-17 MED ORDER — INSULIN GLARGINE 100 UNIT/ML ~~LOC~~ SOLN
30.0000 [IU] | Freq: Every day | SUBCUTANEOUS | Status: DC
  Administered 2013-04-17 – 2013-04-18 (×2): 30 [IU] via SUBCUTANEOUS
  Filled 2013-04-17 (×3): qty 0.3

## 2013-04-17 MED ORDER — ACETAMINOPHEN 325 MG PO TABS
650.0000 mg | ORAL_TABLET | Freq: Four times a day (QID) | ORAL | Status: DC | PRN
  Administered 2013-04-17: 650 mg via ORAL
  Filled 2013-04-17: qty 2

## 2013-04-17 NOTE — H&P (Signed)
PCP:      Chief Complaint:  Chest pain  HPI: 72 YO WF with hx DM, Htn, CAD, seen last Thursday for UTI. Had Rx Cipro but had swelling, rash and itching within one hour. Had some facial swelling as well. Yest PM had brief CP but then awakened at [facility] today with chest and upper abd pain. She was nauseous but did not vomit. She had some SOB. .She took 2 NTG with no impact on the pain. The sxs resolved after 2 hrs. The pain returned and she came to the ER. She has been pain free while here.  Sugars have been up with this illness. She still has itching and rash but no wheeze or tongue swelling. Her abd has been distended. Bowels have been working [employer]. She has been tired with some HA. No other neuro sxs. Minor ankle edema  Review of Systems:  Review of Systems - Negative except as above Past Medical History: Past Medical History  Diagnosis Date  . Hypertension   . Diabetes mellitus   . High cholesterol   . Other specified disease of sebaceous glands   . Other B-complex deficiencies   . Depressive disorder, not elsewhere classified   . Tension headache   . Migraine without aura, with intractable migraine, so stated, with status migrainosus   . Type I (juvenile type) diabetes mellitus without mention of complication, uncontrolled   . Unspecified vitamin D deficiency   . Dyskinesia of esophagus   . Memory loss   . Mild cognitive impairment, so stated   . Other nonspecific abnormal serum enzyme levels   . Lipoma of other skin and subcutaneous tissue   . Pain in joint, shoulder region   . Nonspecific (abnormal) findings on radiological and other examination of abdominal area, including retroperitoneum   . Nonspecific abnormal results of liver function study   . Pain in joint, pelvic region and thigh   . Pain in joint, ankle and foot   . Edema   . Lumbago   . Other symptoms involving cardiovascular system   . Abdominal pain, unspecified site   . Other specified cardiac dysrhythmias(427.89)    . Ventral hernia, unspecified, without mention of obstruction or gangrene   . Extrinsic asthma, unspecified   . Dizziness and giddiness   . Palpitations   . Shortness of breath   . Reflux esophagitis   . Obesity, unspecified   . Unspecified hypothyroidism   . Type I (juvenile type) diabetes mellitus without mention of complication, not stated as uncontrolled   . Other and unspecified hyperlipidemia   . Gout, unspecified   . Unspecified essential hypertension   . Encounter for long-term (current) use of other medications   . Abnormality of gait   . Chest pain, unspecified   . Obstructive chronic bronchitis with exacerbation   . Female stress incontinence   . Apnea   . Cardiomegaly   . Complication of anesthesia     hard time waking up  Past History Past Medical History (reviewed - no changes required): DM II 1985, retinopathy,nephropathy cardiac cath JAN 2004  1. Mild coronary artery disease involving the proximal LAD (left       anterior descending) with some mild calcification.  No significant       obstructive stenoses are noted in the other vessels.   2. Normal LV (left ventricle) function.     sleep apnea vitamin d def fibromyalgia VCD iBS rt anle FX polyps adhesive capsulitis gastric bypass fluid overload  morbid obesity Renal cysts 2012  Allergic reaction to Reclast with hives and hypotension. DEXA 2013 renal cyst       Past Surgical History  Procedure Laterality Date  . Stents      last time she said was 1 yr ago   . Breast surgery    . Appendectomy    . Abdominal hysterectomy    . Gastric bypass  2005  approx  Surgical History (reviewed - no changes required): ankle sgy appy TAH hernia ventral hernia repair '07 gastric bypass 2006  Roux-en-Y gastric bypass right shoulder surgery 2011 MRI 2014: *RADIOLOGY REPORT*   Clinical Data: Chronic low back pain.  Right leg pain and numbness.   MRI LUMBAR SPINE WITHOUT CONTRAST   Technique:   Multiplanar and multiecho pulse sequences of the lumbar spine were obtained without intravenous contrast.   Comparison: 03/31/2011   Findings: The lowest full intervertebral disk space is labeled L5- S1.  If procedural intervention is to be performed, careful correlation with this numbering strategy is recommended.   The conus medullaris appears unremarkable.  Conus level:  L1-2.   Stable chronic anterior wedging at T12.   As before, a right exophytic lower pole renal cyst is partially visualized and contains a fluid-fluid level favoring blood or hemorrhagic contents. Family History (review Medications: Prior to Admission medications   Medication Sig Start Date End Date Taking? Authorizing Provider  aspirin 81 MG tablet Take 81 mg by mouth daily.    Yes Historical Provider, MD  cetirizine (ZYRTEC) 10 MG tablet Take 10 mg by mouth daily. For allergies   Yes Historical Provider, MD  Cholecalciferol (VITAMIN D3) 5000 UNITS CAPS Take 1 tablet by mouth daily. For supplement    Yes Historical Provider, MD  citalopram (CELEXA) 40 MG tablet Take 1 tablet (40 mg total) by mouth daily. 12/06/12  Yes Kimber Relic, MD  fluticasone (FLONASE) 50 MCG/ACT nasal spray Place 1 spray into the nose daily as needed. In each nostril   Yes Historical Provider, MD  glipiZIDE (GLUCOTROL XL) 10 MG 24 hr tablet Take 1 tablet (10 mg total) by mouth 2 (two) times daily. Take in the morning and evening to control blood sugar 12/06/12  Yes Kimber Relic, MD  insulin NPH (HUMULIN N,NOVOLIN N) 100 UNIT/ML injection Inject 45 Units into the skin as directed. 25 units in the morning and 20 units in the evening to control diabetes. 12/06/12  Yes Kimber Relic, MD  losartan-hydrochlorothiazide (HYZAAR) 50-12.5 MG per tablet Take 1 tablet by mouth daily. To control blood presure 12/06/12  Yes Kimber Relic, MD  Magnesium Oxide 250 MG TABS Take 250 mg by mouth daily.    Yes Historical Provider, MD  meclizine (ANTIVERT) 25 MG  tablet Take 1 tablet (25 mg total) by mouth 3 (three) times daily as needed for dizziness. 04/16/13  Yes Trevor Mace, PA-C  nitroGLYCERIN (NITROSTAT) 0.4 MG SL tablet Place 1 tablet (0.4 mg total) under the tongue every 5 (five) minutes as needed. For chest pain. Maximum of 3 tablets in 15 minutes. 12/06/12  Yes Kimber Relic, MD  omeprazole (PRILOSEC) 40 MG capsule Take 40 mg by mouth daily.   Yes Historical Provider, MD    Allergies:   Allergies  Allergen Reactions  . Ciprofloxacin Rash  . Advair Diskus [Fluticasone-Salmeterol] Other (See Comments)    Throat closes  . Albuterol     REACTION: closes throat  . Alendronate Sodium Itching  . Benadryl [Diphenhydramine  Hcl]   . Benzonatate     REACTION: rash/hives  . Cephalexin Nausea Only  . Gabapentin Other (See Comments)    Loss of memory  . Iohexol      Desc: rash and DIF BREATHING   . Keflex [Cephalexin]   . Morphine Nausea And Vomiting  . Other     Decongestants  . Propoxyphene Hcl   . Reclast [Zoledronic Acid] Other (See Comments)    Chest pain  . Avapro [Irbesartan] Rash  . Codeine Nausea And Vomiting, Swelling and Rash  . Tessalon Perles Rash  . Tramadol Nausea Only and Rash    Social History:  reports that she has never smoked. She has never used smokeless tobacco. She reports that she does not drink alcohol or use illicit drugs.Social History (reviewed - no changes required): m, 15 yrs for the 2nd time 2 children 2 gc, 17/13  Family History: Family History  Problem Relation Age of Onset  . Stroke Mother   . Stroke Father   . Diabetes Father   . Heart disease Father   . Diabetes Son   . Cancer Brother     BLADDER  . Diabetes Brother    : dad died with DM, CVA, MI mom died with CVA   Physical Exam: Filed Vitals:   04/17/13 1306 04/17/13 1500 04/17/13 1530 04/17/13 1603  BP: 125/71 126/63 129/63 145/73  Pulse: 98 92 88 92  Temp:    98.6 F (37 C)  TempSrc:    Oral  Resp: 25 21 17 18   Height:     5' (1.524 m)  Weight:    92.806 kg (204 lb 9.6 oz)  SpO2: 94% 93% 94% 93%   General appearance: heavy WF with left greater than right periorbital edema. Sclera anicteric, EOMI without nystagmus. Oral membranes normal and tongue and lips are not swollen  Neck: no adenopathy, no carotid bruit, no JVD and thyroid not enlarged, symmetric, no tenderness/mass/nodules Resp: clear with no wheeze, rales or rhonchi, no accessory ms in use Cardio: regular, sl fast with sys murmur GI: distended  Several well healed scars, non-tender; bowel sounds normal; no masses,  no organomegaly Extremities: extremities normal, atraumatic, no cyanosis or edema Pulses: 2+ and symmetric  Neurologic: Alert and oriented X 3, clear fluent speechnormal strength and tone. Normal symmetric reflexes.  Skin: diffuse maculopapular pain    Labs on Admission:   Recent Labs  04/16/13 1858 04/17/13 1335  NA 135 136  K 4.1 3.9  CL 98 98  CO2 26  --   GLUCOSE 332* 305*  BUN 16 20  CREATININE 0.74 1.20*  CALCIUM 9.4  --       Recent Labs  04/16/13 1858 04/17/13 1311 04/17/13 1335  WBC 11.3* 8.6  --   NEUTROABS  --  6.2  --   HGB 12.1 11.7* 13.3  HCT 38.6 37.1 39.0  MCV 86.4 86.5  --   PLT 333 306  --     10/24:   TSH                       1.77 uIU/ml                 0.40-4.20    Radiological Exams on Admission: Dg Chest 2 View  04/17/2013   CLINICAL DATA:  Shortness of breath and chest pain.  EXAM: CHEST  2 VIEW  COMPARISON:  08/21/2011.  FINDINGS: The heart is enlarged but stable.  The mediastinal and hilar contours are unremarkable and unchanged. Mild chronic bronchitic type lung changes but no definite acute overlying pulmonary process. No pleural effusion. Stable mild eventration of the right hemidiaphragm. The bony thorax is grossly intact. Remote T12 compression fracture.  IMPRESSION: Stable cardiac enlargement but no acute pulmonary findings.   Electronically Signed   By: Loralie Champagne M.D.   On:  04/17/2013 13:57   Orders placed during the hospital encounter of 04/17/13  . EKG 12-LEAD: Sinus tachycardia Low voltage, precordial leads Borderline repolarization abnormality  .      Assessment/Plan Principal Problem:   Chest pain : EKG is unremarkable and first Troponin is fine. Had medically treated CAD at last check. Cycle enzymes, continue ASA CCB. Will get cards to see. Active Problems:   Esophageal reflux: on Rx, but may be related to Rx   Depressive disorder,: doing OK   Vitamin D deficiency disease   Memory loss, mild, no specific Rx   Extrinsic asthma, unspecified   Gait disorder   Unspecified hypothyroidism   Type II or unspecified type diabetes mellitus without mention of complication, not stated as uncontrolled   Other and unspecified hyperlipidemia   UTI (urinary tract infection): on Rocephin after reaction to cipro   Drug reaction: to cipro.    Essential hypertension, benign: continue Rx Laurinda Carreno ALAN 04/17/2013, 5:05 PM

## 2013-04-17 NOTE — Progress Notes (Signed)
Received incoming call from [facility] -Pharmacist [phone].She reports patient called her to report onset of Hives following her first dose of Cipro.Pharmacist requests new medication  Be called in.Patient demographics verified in EPIC.This Clinical research associate explained she would take message and give it to MD.This writer also explained that the Case Manager contact phone line was not  Used for emergent messages.Pharmacist reports she understands.Message taken on correction sheet and will be given to MD .

## 2013-04-17 NOTE — Consult Note (Signed)
Admit date: 04/17/2013 Referring Physician  Dr. Evlyn Kanner Primary Physician GREEN, Lenon Curt, MD Primary Cardiologist  Dr. Donnie Aho Reason for Consultation  chest pain  HPI: 72 year-old female with uncontrolled diabetes, coronary artery disease, hypertension, diabetes, morbid obesity here with atypical chest pain.  She began her story on Thursday night when she had the sensation of urinary urgency every 5 minutes with extreme pain. She began to drink cranberry juice and felt better. Saturday night she began hurting again and finally went into the emergency room on Sunday where she was treated with Cipro for urinary tract infection and went home. On the way home she began to experience a rash on her lower abdomen it became puritic. At first she thought it was the adult diaper she was wearing but then figured it was the Cipro.  She then told me about chest discomfort that she had on Sunday night described as horrible pain beginning in the central abdominal region crossing up all the way to her jaw and radiating across her left chest but not her right. She states that she took nitroglycerin sometimes seems to help with her discomfort. She had a second bout of discomfort and took more nitroglycerin and this concerned her. She states that she thinks that this is her esophagus and is requesting endoscopy. She is not state that she has any exertional chest discomfort at home, no significant shortness of breath other than with her deconditioning.  Currently, her EKG is unremarkable, troponin x2 normal.  Prior cardiac catheterization I believe in 2004 showed calcified vessels, 70% branch vessel disease and mild CAD of her LAD. This was approximately 10 years ago. She also states that it was several years ago when she had a stress test.  Currently she is laying in bed, appears comfortable.   PMH:   Past Medical History  Diagnosis Date  . Hypertension   . Diabetes mellitus   . High cholesterol   . Other  specified disease of sebaceous glands   . Other B-complex deficiencies   . Depressive disorder, not elsewhere classified   . Tension headache   . Migraine without aura, with intractable migraine, so stated, with status migrainosus   . Type I (juvenile type) diabetes mellitus without mention of complication, uncontrolled   . Unspecified vitamin D deficiency   . Dyskinesia of esophagus   . Memory loss   . Mild cognitive impairment, so stated   . Other nonspecific abnormal serum enzyme levels   . Lipoma of other skin and subcutaneous tissue   . Pain in joint, shoulder region   . Nonspecific (abnormal) findings on radiological and other examination of abdominal area, including retroperitoneum   . Nonspecific abnormal results of liver function study   . Pain in joint, pelvic region and thigh   . Pain in joint, ankle and foot   . Edema   . Lumbago   . Other symptoms involving cardiovascular system   . Abdominal pain, unspecified site   . Other specified cardiac dysrhythmias(427.89)   . Ventral hernia, unspecified, without mention of obstruction or gangrene   . Extrinsic asthma, unspecified   . Dizziness and giddiness   . Palpitations   . Shortness of breath   . Reflux esophagitis   . Obesity, unspecified   . Unspecified hypothyroidism   . Type I (juvenile type) diabetes mellitus without mention of complication, not stated as uncontrolled   . Other and unspecified hyperlipidemia   . Gout, unspecified   . Unspecified  essential hypertension   . Encounter for long-term (current) use of other medications   . Abnormality of gait   . Chest pain, unspecified   . Obstructive chronic bronchitis with exacerbation   . Female stress incontinence   . Apnea   . Cardiomegaly   . Complication of anesthesia     hard time waking up    PSH:   Past Surgical History  Procedure Laterality Date  . Stents      last time she said was 1 yr ago   . Breast surgery    . Appendectomy    . Abdominal  hysterectomy    . Gastric bypass  2005  approx   Allergies:  Ciprofloxacin; Advair diskus; Albuterol; Alendronate sodium; Benadryl; Benzonatate; Cephalexin; Gabapentin; Iohexol; Keflex; Morphine; Other; Propoxyphene hcl; Reclast; Avapro; Codeine; Tessalon perles; and Tramadol Prior to Admit Meds:   Prior to Admission medications   Medication Sig Start Date End Date Taking? Authorizing Provider  aspirin 81 MG tablet Take 81 mg by mouth daily.    Yes Historical Provider, MD  cetirizine (ZYRTEC) 10 MG tablet Take 10 mg by mouth daily. For allergies   Yes Historical Provider, MD  Cholecalciferol (VITAMIN D3) 5000 UNITS CAPS Take 1 tablet by mouth daily. For supplement    Yes Historical Provider, MD  citalopram (CELEXA) 40 MG tablet Take 1 tablet (40 mg total) by mouth daily. 12/06/12  Yes Kimber Relic, MD  fluticasone (FLONASE) 50 MCG/ACT nasal spray Place 1 spray into the nose daily as needed. In each nostril   Yes Historical Provider, MD  glipiZIDE (GLUCOTROL XL) 10 MG 24 hr tablet Take 1 tablet (10 mg total) by mouth 2 (two) times daily. Take in the morning and evening to control blood sugar 12/06/12  Yes Kimber Relic, MD  insulin NPH (HUMULIN N,NOVOLIN N) 100 UNIT/ML injection Inject 45 Units into the skin as directed. 25 units in the morning and 20 units in the evening to control diabetes. 12/06/12  Yes Kimber Relic, MD  losartan-hydrochlorothiazide (HYZAAR) 50-12.5 MG per tablet Take 1 tablet by mouth daily. To control blood presure 12/06/12  Yes Kimber Relic, MD  Magnesium Oxide 250 MG TABS Take 250 mg by mouth daily.    Yes Historical Provider, MD  meclizine (ANTIVERT) 25 MG tablet Take 1 tablet (25 mg total) by mouth 3 (three) times daily as needed for dizziness. 04/16/13  Yes Trevor Mace, PA-C  nitroGLYCERIN (NITROSTAT) 0.4 MG SL tablet Place 1 tablet (0.4 mg total) under the tongue every 5 (five) minutes as needed. For chest pain. Maximum of 3 tablets in 15 minutes. 12/06/12  Yes Kimber Relic, MD  omeprazole (PRILOSEC) 40 MG capsule Take 40 mg by mouth daily.   Yes Historical Provider, MD   Fam HX:    Family History  Problem Relation Age of Onset  . Stroke Mother   . Stroke Father   . Diabetes Father   . Heart disease Father   . Diabetes Son   . Cancer Brother     BLADDER  . Diabetes Brother    Social HX:    History   Social History  . Marital Status: Married    Spouse Name: N/A    Number of Children: N/A  . Years of Education: N/A   Occupational History  . Not on file.   Social History Main Topics  . Smoking status: Never Smoker   . Smokeless tobacco: Never Used  . Alcohol  Use: No  . Drug Use: No  . Sexual Activity: Not on file   Other Topics Concern  . Not on file   Social History Narrative  . No narrative on file     ROS:  Denies any bleeding, syncope, fevers, orthopnea, PND. Positive for rash, abdomen/chest/jaw pain. All 11 ROS were addressed and are negative except what is stated in the HPI   Physical Exam: Blood pressure 145/73, pulse 92, temperature 98.6 F (37 C), temperature source Oral, resp. rate 18, height 5' (1.524 m), weight 204 lb 9.6 oz (92.806 kg), SpO2 93.00%.   General: Well developed, well nourished, in no acute distress Head: Eyes PERRLA, No xanthomas.   Normal cephalic and atramatic  Lungs:   Clear bilaterally to auscultation and percussion. Normal respiratory effort. No wheezes, no rales. Heart:   HRRR S1 S2 Pulses are 2+ & equal. No murmurs, rubs, gallops            No carotid bruit. No JVD.  No abdominal bruits.  Abdomen: Bowel sounds are positive, abdomen soft and non-tender without masses. No hepatosplenomegaly. obese, prior surgical scar noted Msk:  Back normal. Normal strength and tone for age. Extremities:  No clubbing, cyanosis or edema.  DP +1 Neuro: Alert and oriented X 3, non-focal, MAE x 4 GU: Deferred Rectal: Deferred Psych:  Good affect, responds appropriately      Labs: Lab Results  Component  Value Date   WBC 8.6 04/17/2013   HGB 13.3 04/17/2013   HCT 39.0 04/17/2013   MCV 86.5 04/17/2013   PLT 306 04/17/2013    Recent Labs Lab 04/16/13 1858 04/17/13 1335  NA 135 136  K 4.1 3.9  CL 98 98  CO2 26  --   BUN 16 20  CREATININE 0.74 1.20*  CALCIUM 9.4  --   GLUCOSE 332* 305*    Recent Labs  04/17/13 1704  TROPONINI <0.30     Radiology:  Dg Chest 2 View  04/17/2013   CLINICAL DATA:  Shortness of breath and chest pain.  EXAM: CHEST  2 VIEW  COMPARISON:  08/21/2011.  FINDINGS: The heart is enlarged but stable. The mediastinal and hilar contours are unremarkable and unchanged. Mild chronic bronchitic type lung changes but no definite acute overlying pulmonary process. No pleural effusion. Stable mild eventration of the right hemidiaphragm. The bony thorax is grossly intact. Remote T12 compression fracture.  IMPRESSION: Stable cardiac enlargement but no acute pulmonary findings.   Electronically Signed   By: Loralie Champagne M.D.   On: 04/17/2013 13:57   Personally viewed.  EKG:   04/17/13-sinus tachycardia rate 106 with nonspecific T-wave flattening. Personally viewed.   ASSESSMENT/PLAN:   72 year old female with uncontrolled diabetes, morbid obesity, hypertension, hyperlipidemia, coronary artery disease from catheterization in 2004 demonstrating nonobstructive calcified mid LAD and a 70% branch vessel disease here with atypical chest pain.  1. Atypical chest pain-she seems to be convinced that this is her esophagus. However, she clearly states that she understands that she is at risk for heart disease as well. With her previously described coronary artery disease as well as uncontrolled diabetes and multiple risk factors, it would not be unreasonable to pursue further risk stratification/investigate for possible ischemia with nuclear stress test. I described to her the pros and cons of stress testing including transportation to [facility] hospital and the likely need for a 2 day  study given her weight. She understands this. I also gave her the option of being observed overnight, being  discharged been having close followup with Dr. Donnie Aho and getting an outpatient stress test set up and she was honest with the fact that she said that she would not likely show up for the stress test as an outpatient. She is willing to have testing done here. She's also asking for endoscopy. I stated that we should have the heart evaluated first prior to endoscopic procedure. She can discuss this further with Dr. Evlyn Kanner.  2. Morbid obesity-encourage weight loss  3. Uncontrolled diabetes-encouraged her to continue to work with Dr. Evlyn Kanner.  4. Hypertension-currently controlled  5. Hyperlipidemia-do not currently see statin on her outpatient medication list. Strongly consider. Perhaps she has had intolerance in the past.  I will relate message to Dr. Donnie Aho. I will make n.p.o. past midnight for stress test. I also informed her that Dr. Donnie Aho may review her record and possibly change course of action for further testing.  Donato Schultz, MD  04/17/2013  7:38 PM

## 2013-04-17 NOTE — Progress Notes (Signed)
Outgoing call placed to [facility] for an order for patients new prescription per PA.Fayrene Helper.Bactrim 800-160 MG Tablets-BID for three days.Pharmacist verbalizes her Understanding.No further Case Manager needs identified at this time.

## 2013-04-17 NOTE — ED Provider Notes (Signed)
On and how CSN: [id number]     Arrival date & time 04/17/13  1231 History   First MD Initiated Contact with Patient 04/17/13 1259     Chief Complaint  Patient presents with  . Chest Pain   (Consider location/radiation/quality/duration/timing/severity/associated sxs/prior Treatment) Patient is a 72 y.o. female presenting with chest pain. The history is provided by the patient.  Chest Pain Associated symptoms: nausea   Associated symptoms: no abdominal pain, no back pain, no headache, no numbness, no shortness of breath, not vomiting and no weakness    patient woke with chest pain that went from her epigastric area up to her neck and left jaw. It is dull. It began around 3 in the morning. She states it improved somewhat after 3 doses of nitroglycerin. She states it had been returned this morning. She continues to have pain. She's had some nausea. No diaphoresis. No fevers. She states that she had had difficulty urinating yesterday and was diagnosed with UTI. She was given Cipro but had an allergic reaction to it. She states that she developed a rash. No fevers. She states she does not have heart problems. She states she has a nitroglycerin for esophageal spasm. pain feels like that. She states it does not normally go lower jaw. Mild difficulty breathing.  Past Medical History  Diagnosis Date  . Hypertension   . Diabetes mellitus   . High cholesterol   . Other specified disease of sebaceous glands   . Other B-complex deficiencies   . Depressive disorder, not elsewhere classified   . Tension headache   . Migraine without aura, with intractable migraine, so stated, with status migrainosus   . Type I (juvenile type) diabetes mellitus without mention of complication, uncontrolled   . Unspecified vitamin D deficiency   . Dyskinesia of esophagus   . Memory loss   . Mild cognitive impairment, so stated   . Other nonspecific abnormal serum enzyme levels   . Lipoma of other skin and subcutaneous  tissue   . Pain in joint, shoulder region   . Nonspecific (abnormal) findings on radiological and other examination of abdominal area, including retroperitoneum   . Nonspecific abnormal results of liver function study   . Pain in joint, pelvic region and thigh   . Pain in joint, ankle and foot   . Edema   . Lumbago   . Other symptoms involving cardiovascular system   . Abdominal pain, unspecified site   . Other specified cardiac dysrhythmias(427.89)   . Ventral hernia, unspecified, without mention of obstruction or gangrene   . Extrinsic asthma, unspecified   . Dizziness and giddiness   . Palpitations   . Shortness of breath   . Reflux esophagitis   . Obesity, unspecified   . Unspecified hypothyroidism   . Type I (juvenile type) diabetes mellitus without mention of complication, not stated as uncontrolled   . Other and unspecified hyperlipidemia   . Gout, unspecified   . Unspecified essential hypertension   . Encounter for long-term (current) use of other medications   . Abnormality of gait   . Chest pain, unspecified   . Obstructive chronic bronchitis with exacerbation   . Female stress incontinence   . Apnea   . Cardiomegaly   . Complication of anesthesia     hard time waking up   Past Surgical History  Procedure Laterality Date  . Stents      last time she said was 1 yr ago   . Breast surgery    .  Appendectomy    . Abdominal hysterectomy    . Gastric bypass  2005  approx   Family History  Problem Relation Age of Onset  . Stroke Mother   . Stroke Father   . Diabetes Father   . Heart disease Father   . Diabetes Son   . Cancer Brother     BLADDER  . Diabetes Brother    History  Substance Use Topics  . Smoking status: Never Smoker   . Smokeless tobacco: Never Used  . Alcohol Use: No   OB History   Grav Para Term Preterm Abortions TAB SAB Ect Mult Living                 Review of Systems  Constitutional: Negative for activity change and appetite change.   Eyes: Negative for pain.  Respiratory: Negative for chest tightness and shortness of breath.   Cardiovascular: Positive for chest pain. Negative for leg swelling.  Gastrointestinal: Positive for nausea. Negative for vomiting, abdominal pain and diarrhea.  Genitourinary: Positive for dysuria. Negative for flank pain.  Musculoskeletal: Negative for back pain and neck stiffness.  Skin: Negative for rash.  Neurological: Negative for weakness, numbness and headaches.  Psychiatric/Behavioral: Negative for behavioral problems.    Allergies  Ciprofloxacin; Advair diskus; Albuterol; Alendronate sodium; Benadryl; Benzonatate; Cephalexin; Gabapentin; Iohexol; Keflex; Morphine; Other; Propoxyphene hcl; Reclast; Avapro; Codeine; Tessalon perles; and Tramadol  Home Medications   No current outpatient prescriptions on file. BP 145/73  Pulse 92  Temp(Src) 98.6 F (37 C) (Oral)  Resp 18  Ht 5' (1.524 m)  Wt 204 lb 9.6 oz (92.806 kg)  BMI 39.96 kg/m2  SpO2 93% Physical Exam  Nursing note and vitals reviewed. Constitutional: She is oriented to person, place, and time. She appears well-developed and well-nourished.  HENT:  Head: Normocephalic and atraumatic.  Eyes: EOM are normal. Pupils are equal, round, and reactive to light.  Neck: Normal range of motion. Neck supple.  Cardiovascular: Normal rate, regular rhythm and normal heart sounds.   No murmur heard. Pulmonary/Chest: Effort normal and breath sounds normal. No respiratory distress. She has no wheezes. She has no rales.  Abdominal: Soft. Bowel sounds are normal. She exhibits distension. There is tenderness. There is no rebound and no guarding.  Mild distention  Musculoskeletal: Normal range of motion.  Neurological: She is alert and oriented to person, place, and time. No cranial nerve deficit.  Skin: Skin is warm and dry. Rash noted.  Diffuse hives abdomen.  Psychiatric: She has a normal mood and affect. Her speech is normal.    ED  Course  Procedures (including critical care time) Labs Review Labs Reviewed  CBC WITH DIFFERENTIAL - Abnormal; Notable for the following:    Hemoglobin 11.7 (*)    All other components within normal limits  POCT I-STAT, CHEM 8 - Abnormal; Notable for the following:    Creatinine, Ser 1.20 (*)    Glucose, Bld 305 (*)    All other components within normal limits  POCT I-STAT TROPONIN I   Imaging Review Dg Chest 2 View  04/17/2013   CLINICAL DATA:  Shortness of breath and chest pain.  EXAM: CHEST  2 VIEW  COMPARISON:  08/21/2011.  FINDINGS: The heart is enlarged but stable. The mediastinal and hilar contours are unremarkable and unchanged. Mild chronic bronchitic type lung changes but no definite acute overlying pulmonary process. No pleural effusion. Stable mild eventration of the right hemidiaphragm. The bony thorax is grossly intact. Remote T12 compression  fracture.  IMPRESSION: Stable cardiac enlargement but no acute pulmonary findings.   Electronically Signed   By: Loralie Champagne M.D.   On: 04/17/2013 13:57    EKG Interpretation    Date/Time:  Monday April 17 2013 12:34:40 EST Ventricular Rate:  106 PR Interval:  146 QRS Duration: 57 QT Interval:  358 QTC Calculation: 475 R Axis:   36 Text Interpretation:  Sinus tachycardia Low voltage, precordial leads Borderline repolarization abnormality            MDM   1. Chest pain   2. UTI (urinary tract infection)    Patient with chest pain from    epigastric area the jaw. EKG reassuring. Enzymes negative. He also has UTI and had a reaction to Cipro. has some cardiac disease after discussion with Dr. Evlyn Kanner, the patient's PCP. Will be admitted.  Juliet Rude. Rubin Payor, MD 04/17/13 [phone]

## 2013-04-17 NOTE — ED Notes (Signed)
Patient reports that she was asleep and the chest pain woke her up at 0300 today and she took NTG x 2 SL. Patient states the pain was better, but got worse at 1200 today and she took another NTG x1 today. Patient states the pain decreased in her jaw, neck, central chest and left breast area. Patient now rates pain 5/10. Patient states she also experienced SOB and weakness. Patient reports that she was seen in the ED yesterday and was diagnosed with an UTI and received Cipro which caused her to have hives.

## 2013-04-18 ENCOUNTER — Observation Stay (HOSPITAL_COMMUNITY): Payer: Medicare Other

## 2013-04-18 DIAGNOSIS — K219 Gastro-esophageal reflux disease without esophagitis: Secondary | ICD-10-CM | POA: Diagnosis not present

## 2013-04-18 DIAGNOSIS — N390 Urinary tract infection, site not specified: Secondary | ICD-10-CM | POA: Diagnosis not present

## 2013-04-18 DIAGNOSIS — R079 Chest pain, unspecified: Secondary | ICD-10-CM | POA: Diagnosis not present

## 2013-04-18 DIAGNOSIS — I10 Essential (primary) hypertension: Secondary | ICD-10-CM | POA: Diagnosis not present

## 2013-04-18 LAB — TROPONIN I: Troponin I: <0.3 ng/mL (ref <0.30)

## 2013-04-18 LAB — GLUCOSE, CAPILLARY
Glucose-Capillary: 162 mg/dL — ABNORMAL HIGH (ref 70–99)
Glucose-Capillary: 143 mg/dL — ABNORMAL HIGH (ref 70–99)

## 2013-04-18 LAB — URINE CULTURE: Culture: >=100000

## 2013-04-18 MED ORDER — REGADENOSON 0.4 MG/5ML IV SOLN
0.4000 mg | Freq: Once | INTRAVENOUS | Status: AC
  Administered 2013-04-18: 12:00:00 0.4 mg via INTRAVENOUS
  Filled 2013-04-18: qty 5

## 2013-04-18 MED ORDER — TECHNETIUM TC 99M TETROFOSMIN IV KIT
30.0000 | PACK | Freq: Once | INTRAVENOUS | Status: AC | PRN
  Administered 2013-04-18: 30 via INTRAVENOUS

## 2013-04-18 MED ORDER — DEXTROSE 5 % IV SOLN
1.0000 g | INTRAVENOUS | Status: DC
  Administered 2013-04-18 – 2013-04-19 (×2): 1 g via INTRAVENOUS
  Filled 2013-04-18 (×2): qty 10

## 2013-04-18 MED ORDER — TECHNETIUM TC 99M SESTAMIBI GENERIC - CARDIOLITE
10.0000 | Freq: Once | INTRAVENOUS | Status: AC | PRN
  Administered 2013-04-18: 10:00:00 10 via INTRAVENOUS

## 2013-04-18 MED ORDER — VITAMINS A & D EX OINT
TOPICAL_OINTMENT | CUTANEOUS | Status: AC
  Administered 2013-04-18: 5
  Filled 2013-04-18: qty 5

## 2013-04-18 NOTE — Progress Notes (Signed)
Patient seen earlier today and was admitted mainly with symptoms related to treatment for a recent urinary tract infection but also chest and abdominal pain. Her enzymes have been negative and EKG showed minor nonspecific ST changes.  A Lexiscan Myoview test done this morning shows an ejection fraction of 70% with normal myocardial perfusion and no evidence of ischemia.  She has a history of mild coronary artery disease in the past but a negative perfusion scan. She uses nitroglycerin also for esophageal spasm. She wonders whether she should have an endoscopy or not. I will defer that to Dr. Evlyn Kanner.  Darden Palmer MD [facility]

## 2013-04-18 NOTE — Progress Notes (Signed)
Subjective: Had a good night. Some CP when eating but none otherwise. Breathing well. No f/c/s No other complaints. Heading over to [facility] for cardiac testing   Objective: Vital signs in last 24 hours: Temp:  [97.6 F (36.4 C)-98.9 F (37.2 C)] 97.6 F (36.4 C) (11/18 0451) Pulse Rate:  [76-114] 76 (11/18 0451) Resp:  [17-27] 18 (11/18 0451) BP: (119-145)/(51-73) 133/54 mmHg (11/18 0451) SpO2:  [93 %-98 %] 98 % (11/18 0451) Weight:  [92.806 kg (204 lb 9.6 oz)] 92.806 kg (204 lb 9.6 oz) (11/17 1603)  Intake/Output from previous day: 11/17 0701 - 11/18 0700 In: 1120 [P.O.:120; I.V.:1000] Out: 150 [Urine:150] Intake/Output this shift:    Sitting up in no distress. Less facial swelling lungs clear. Ht regular. abd obese less distended. No edema. Awake.,alert. Clear speech  Lab Results   Recent Labs  04/16/13 1858 04/17/13 1311 04/17/13 1335  WBC 11.3* 8.6  --   RBC 4.47 4.29  --   HGB 12.1 11.7* 13.3  HCT 38.6 37.1 39.0  MCV 86.4 86.5  --   MCH 27.1 27.3  --   RDW 15.0 15.3  --   PLT 333 306  --     Recent Labs  04/16/13 1858 04/17/13 1335  NA 135 136  K 4.1 3.9  CL 98 98  CO2 26  --   GLUCOSE 332* 305*  BUN 16 20  CREATININE 0.74 1.20*  CALCIUM 9.4  --   Results for Mckenzie, [name] (MRN [id number]) as of 04/18/2013 08:48  Ref. Range 04/17/2013 17:04 04/17/2013 17:11 04/17/2013 22:15 04/18/2013 04:10  Troponin I Latest Range: <0.30 ng/mL <0.30  <0.30 <0.30    Studies/Results: Dg Chest 2 View  04/17/2013   CLINICAL DATA:  Shortness of breath and chest pain.  EXAM: CHEST  2 VIEW  COMPARISON:  08/21/2011.  FINDINGS: The heart is enlarged but stable. The mediastinal and hilar contours are unremarkable and unchanged. Mild chronic bronchitic type lung changes but no definite acute overlying pulmonary process. No pleural effusion. Stable mild eventration of the right hemidiaphragm. The bony thorax is grossly intact. Remote T12 compression fracture.  IMPRESSION: Stable  cardiac enlargement but no acute pulmonary findings.   Electronically Signed   By: Loralie Champagne M.D.   On: 04/17/2013 13:57    Scheduled Meds: . aspirin  81 mg Oral Daily  . cholecalciferol  5,000 Units Oral Daily  . citalopram  40 mg Oral Daily  . diphenhydrAMINE  25 mg Oral BID  . enoxaparin (LOVENOX) injection  40 mg Subcutaneous Q24H  . losartan  50 mg Oral Daily   And  . hydrochlorothiazide  12.5 mg Oral Daily  . insulin aspart  0-15 Units Subcutaneous TID WC  . insulin aspart  0-5 Units Subcutaneous QHS  . insulin aspart  3 Units Subcutaneous TID WC  . insulin glargine  30 Units Subcutaneous QHS  . loratadine  10 mg Oral Daily  . pantoprazole  40 mg Oral Daily   Continuous Infusions: . sodium chloride 125 mL/hr at 04/17/13 2308   PRN Meds:acetaminophen, fluticasone, nitroGLYCERIN  Assessment/Plan: CHEST PAIN: to [facility] for stress testing. Trop neg x 3 GERD: May need GI investigation if cardiac studies are fine UTI: to continue rocephin DM 2: FBS 143 HYPERLIPIDEMIA: on Rx HYPERTENSION: BP OK   LOS: 1 day   [name] 04/18/2013, 8:44 AM

## 2013-04-18 NOTE — Progress Notes (Signed)
Utilization review completed.  

## 2013-04-19 MED ORDER — AMOXICILLIN-POT CLAVULANATE 875-125 MG PO TABS: 1.0000 | ORAL_TABLET | Freq: Two times a day (BID) | ORAL | Status: DC

## 2013-04-19 NOTE — Discharge Summary (Signed)
DISCHARGE SUMMARY  [name]  MR#: [id number]  DOB:1941-02-21  Date of Admission: 04/17/2013 Date of Discharge: 04/19/2013  Attending Physician:Azari Hasler Hessie Diener  Patient's PCP: Adrian Prince  Consults:Treatment Team:  Othella Boyer, MD  Discharge Diagnoses: Principal Problem:   Chest pain, with negative troponins and negative Myoview this admission Active Problems:   Esophageal reflux, likely the cause of chest pain, to have an outpatient evaluation by Dr Matthias Hughs   Depressive disorder,  stable   Vitamin D deficiency disease on therapy   Memory loss mild   Extrinsic asthma, unspecified, stable with minimal wheezing   Gait disorder, stable  hypothyroidism   Type II or unspecified type diabetes mellitus without mention of complication, not stated as uncontrolled, with good fasting blood sugar of 94 this morning    Hyperlipidemia, on therapy   UTI (urinary tract infection), with urine culture showing sensitive Escherichia coli   Drug reaction to ciprofloxacin, now improved   Essential hypertension, benign, , stable Mild renal insufficiency   Discharge Medications:   Medication List    STOP taking these medications       ciprofloxacin 500 MG tablet  Commonly known as:  CIPRO      TAKE these medications       amoxicillin-clavulanate 875-125 MG per tablet  Commonly known as:  AUGMENTIN  Take 1 tablet by mouth 2 (two) times daily.     aspirin 81 MG tablet  Take 81 mg by mouth daily.     cetirizine 10 MG tablet  Commonly known as:  ZYRTEC  Take 10 mg by mouth daily. For allergies     citalopram 40 MG tablet  Commonly known as:  CELEXA  Take 1 tablet (40 mg total) by mouth daily.     FLONASE 50 MCG/ACT nasal spray  Generic drug:  fluticasone  Place 1 spray into the nose daily as needed. In each nostril     glipiZIDE 10 MG 24 hr tablet  Commonly known as:  GLUCOTROL XL  Take 1 tablet (10 mg total) by mouth 2 (two) times daily. Take in the morning and  evening to control blood sugar     insulin NPH 100 UNIT/ML injection  Commonly known as:  HUMULIN N,NOVOLIN N  Inject 45 Units into the skin as directed. 25 units in the morning and 20 units in the evening to control diabetes.     losartan-hydrochlorothiazide 50-12.5 MG per tablet  Commonly known as:  HYZAAR  Take 1 tablet by mouth daily. To control blood presure     Magnesium Oxide 250 MG Tabs  Take 250 mg by mouth daily.     meclizine 25 MG tablet  Commonly known as:  ANTIVERT  Take 1 tablet (25 mg total) by mouth 3 (three) times daily as needed for dizziness.     nitroGLYCERIN 0.4 MG SL tablet  Commonly known as:  NITROSTAT  Place 1 tablet (0.4 mg total) under the tongue every 5 (five) minutes as needed. For chest pain. Maximum of 3 tablets in 15 minutes.     omeprazole 40 MG capsule  Commonly known as:  PRILOSEC  Take 40 mg by mouth TWICE daily      Vitamin D3 5000 UNITS Caps  - Take 1 tablet by mouth daily. For supplement  -         Hospital Procedures: Dg Chest 2 View  04/17/2013   CLINICAL DATA:  Shortness of breath and chest pain.  EXAM: CHEST  2 VIEW  COMPARISON:  08/21/2011.  FINDINGS: The heart is enlarged but stable. The mediastinal and hilar contours are unremarkable and unchanged. Mild chronic bronchitic type lung changes but no definite acute overlying pulmonary process. No pleural effusion. Stable mild eventration of the right hemidiaphragm. The bony thorax is grossly intact. Remote T12 compression fracture.  IMPRESSION: Stable cardiac enlargement but no acute pulmonary findings.   Electronically Signed   By: Loralie Champagne M.D.   On: 04/17/2013 13:57   Nm Myocar Multi W/planar W/wall Motion / Ef  04/18/2013   CLINICAL DATA:  72 year old female with chest pain  EXAM: MYOCARDIAL IMAGING WITH SPECT (REST AND PHARMACOLOGIC-STRESS)  GATED LEFT VENTRICULAR WALL MOTION STUDY  LEFT VENTRICULAR EJECTION FRACTION  TECHNIQUE: Standard myocardial SPECT imaging was  performed after resting intravenous injection of 10 mCi Tc-46m sestamibi. Subsequently, intravenous infusion of Lexiscan was performed under the supervision of the Cardiology staff. At peak effect of the drug, 30 mCi Tc-70m sestamibi was injected intravenously and standard myocardial SPECT imaging was performed. Quantitative gated imaging was also performed to evaluate left ventricular wall motion, and estimate left ventricular ejection fraction.  COMPARISON:  Chest radiograph 04/17/2013  FINDINGS: Perfusion: No decreased counts within the left ventricle to suggests reversible ischemia or infarction.  Wall Motion: No focal wall motion abnormality  Ejection Fraction: 70 %  End-diastolic volume equals: 59 mL  End systolic volume equals: 18 mL  IMPRESSION: 1. No reversible ischemia or infarction.  2.  Normal wall motion.  3. Ejection fraction equals 70 %   Electronically Signed   By: Genevive Bi M.D.   On: 04/18/2013 17:31    History of Present Illness: Chest pain  Hospital Course: This is a 72 year old white female with a history of type 2 diabetes, hypertension, and nonobstructive coronary disease. She presented with several episodes of chest pain over the preceding 8-12 hours. She also spent have urinary tract infection. She also was noted to have a allergic reaction to ciprofloxacin that she had been given a few days before. She is admitted today with broad-spectrum antibiotics in the form of Rocephin. Patient's had minor chest pain while here. Most of them postprandial. She was seen by cardiology. A series of troponins was negative. EKG showed mild abnormalities however. Yesterday she underwent a myocardial imaging test and was entirely normal. There is no reversible ischemia or infarction. Details are as above. Patient is improved and has no chest pain in the last 24 hours. She is breathing well. Her bowels worked this morning. She is taking a regular diet. She has walked  in the room. She has some  minor sweats last night but no measured fever. She has no other complaints. I think we can transition to oral antibiotics and discharge her home. The culture has returned with a fairly sensitive Escherichia coli. Her allergic reaction seems to have resolved. Her facial swelling is gone. The rash on her abdomen is nearly gone.  Day of Discharge Exam BP 137/56  Pulse 67  Temp(Src) 98.1 F (36.7 C) (Oral)  Resp 18  Ht 5' (1.524 m)  Wt 92.806 kg (204 lb 9.6 oz)  BMI 39.96 kg/m2  SpO2 96%  Physical Exam: General appearance: Healthy-appearing white female lying flat in no distress. Facial swelling has resolved. Sclerae are anicteric. Oral mucous members are normal with no lesions. Tongue is not swollen. Neck is supple without bruits.  Resp: Clear with no wheezes rales or rhonchi. No accessory muscles are in use. Cardio: Regular with systolic murmur. GI:  Obese soft, non-tender; bowel sounds normal; no masses,  no organomegaly Extremities: no clubbing, cyanosis or edema, strong distal pulses are present. Skin exam reveals a nearly resolved rash of the abdomen Neuro: Patient is awake and alert with clear fluent speech. She is in good spirits. No resting tremor is present. Grip is equal bilaterally.  Discharge Labs:  Recent Labs  04/16/13 1858 04/17/13 1335  NA 135 136  K 4.1 3.9  CL 98 98  CO2 26  --   GLUCOSE 332* 305*  BUN 16 20  CREATININE 0.74 1.20*  CALCIUM 9.4  --      Recent Labs  04/16/13 1858 04/17/13 1311 04/17/13 1335  WBC 11.3* 8.6  --   NEUTROABS  --  6.2  --   HGB 12.1 11.7* 13.3  HCT 38.6 37.1 39.0  MCV 86.4 86.5  --   PLT 333 306  --      Recent Labs  04/17/13 1704 04/17/13 2215 04/18/13 0410  TROPONINI <0.30 <0.30 <0.30     ESCHERICHIA COLI    Antibiotic Sensitivity Microscan Status    AMPICILLIN Sensitive <=2 SENSITIVE Final    Method: MIC    CEFAZOLIN Sensitive <=4 SENSITIVE Final    Method: MIC    CEFTRIAXONE Sensitive <=1 SENSITIVE Final     Method: MIC    CIPROFLOXACIN Sensitive <=0.25 SENSITIVE Final    Method: MIC    GENTAMICIN Sensitive <=1 SENSITIVE Final    Method: MIC    LEVOFLOXACIN Sensitive 1 SENSITIVE Final    Method: MIC    NITROFURANTOIN Sensitive 32 SENSITIVE Final    Method: MIC    PIP/TAZO Sensitive <=4 SENSITIVE Final    Method: MIC    TOBRAMYCIN Sensitive <=1 SENSITIVE Final    Method: MIC    TRIMETH/SULFA Sensitive <=20 SENSITIVE Final    Method: MIC    Comments ESCHERICHIA COLI (MIC)    >=100,000 COLONIES/mL ESCHERICHIA COLI            Discharge instructions:      Future Appointments Provider Department Dept Phone   06/05/2013 8:00 AM [facility] [phone]   06/07/2013 12:00 PM Kimber Relic, MD [facility] [phone]      Disposition:  To home  Follow-up Appts: She'll be set up to see GI as an outpatient. I suspect endoscopy will be needed. Follow-up with Dr. Evlyn Kanner at [facility] in 1-2 weeks.  Call for appointment.  Condition on Discharge: Improved  Tests Needing Follow-up: None  Signed: Newell Frater ALAN 04/19/2013, 6:49 AM

## 2013-04-25 DIAGNOSIS — N390 Urinary tract infection, site not specified: Secondary | ICD-10-CM | POA: Diagnosis not present

## 2013-05-19 DIAGNOSIS — N390 Urinary tract infection, site not specified: Secondary | ICD-10-CM | POA: Diagnosis not present

## 2013-05-19 DIAGNOSIS — R3129 Other microscopic hematuria: Secondary | ICD-10-CM | POA: Diagnosis not present

## 2013-05-19 DIAGNOSIS — N281 Cyst of kidney, acquired: Secondary | ICD-10-CM | POA: Diagnosis not present

## 2013-06-05 ENCOUNTER — Encounter: Payer: Self-pay | Admitting: *Deleted

## 2013-06-05 ENCOUNTER — Other Ambulatory Visit: Payer: Medicare Other

## 2013-06-05 DIAGNOSIS — E109 Type 1 diabetes mellitus without complications: Secondary | ICD-10-CM

## 2013-06-05 DIAGNOSIS — E039 Hypothyroidism, unspecified: Secondary | ICD-10-CM | POA: Diagnosis not present

## 2013-06-05 DIAGNOSIS — E785 Hyperlipidemia, unspecified: Secondary | ICD-10-CM

## 2013-06-06 LAB — LIPID PANEL
Chol/HDL Ratio: 4.9 ratio units — ABNORMAL HIGH (ref 0.0–4.4)
Cholesterol, Total: 216 mg/dL — ABNORMAL HIGH (ref 100–199)
HDL: 44 mg/dL (ref >39)
LDL Calculated: 136 mg/dL — ABNORMAL HIGH (ref 0–99)
Triglycerides: 179 mg/dL — ABNORMAL HIGH (ref 0–149)
VLDL Cholesterol Cal: 36 mg/dL (ref 5–40)

## 2013-06-06 LAB — COMPREHENSIVE METABOLIC PANEL
A/G RATIO: 1.6 (ref 1.1–2.5)
ALBUMIN: 3.9 g/dL (ref 3.5–4.8)
ALT: 14 IU/L (ref 0–32)
AST: 17 IU/L (ref 0–40)
Alkaline Phosphatase: 89 IU/L (ref 39–117)
BUN/Creatinine Ratio: 20 (ref 11–26)
BUN: 17 mg/dL (ref 8–27)
CALCIUM: 9.2 mg/dL (ref 8.6–10.2)
CO2: 24 mmol/L (ref 18–29)
Chloride: 99 mmol/L (ref 97–108)
Creatinine, Ser: 0.83 mg/dL (ref 0.57–1.00)
GFR calc Af Amer: 80 mL/min/{1.73_m2} (ref >59)
GFR, EST NON AFRICAN AMERICAN: 70 mL/min/{1.73_m2} (ref >59)
GLOBULIN, TOTAL: 2.4 g/dL (ref 1.5–4.5)
GLUCOSE: 132 mg/dL — AB (ref 65–99)
Potassium: 4 mmol/L (ref 3.5–5.2)
SODIUM: 140 mmol/L (ref 134–144)
TOTAL PROTEIN: 6.3 g/dL (ref 6.0–8.5)
Total Bilirubin: 0.5 mg/dL (ref 0.0–1.2)

## 2013-06-06 LAB — HEMOGLOBIN A1C
ESTIMATED AVERAGE GLUCOSE: 197 mg/dL
HEMOGLOBIN A1C: 8.5 % — AB (ref 4.8–5.6)

## 2013-06-06 LAB — TSH: TSH: 2.2 u[IU]/mL (ref 0.450–4.500)

## 2013-06-07 ENCOUNTER — Encounter: Payer: Self-pay | Admitting: Internal Medicine

## 2013-06-07 ENCOUNTER — Ambulatory Visit (INDEPENDENT_AMBULATORY_CARE_PROVIDER_SITE_OTHER): Payer: Medicare Other | Admitting: Internal Medicine

## 2013-06-07 VITALS — BP 140/82 | HR 92 | Temp 98.7°F | Resp 18 | Ht 60.0 in | Wt 206.4 lb

## 2013-06-07 DIAGNOSIS — R269 Unspecified abnormalities of gait and mobility: Secondary | ICD-10-CM

## 2013-06-07 DIAGNOSIS — IMO0001 Reserved for inherently not codable concepts without codable children: Secondary | ICD-10-CM

## 2013-06-07 DIAGNOSIS — R682 Dry mouth, unspecified: Secondary | ICD-10-CM

## 2013-06-07 DIAGNOSIS — R413 Other amnesia: Secondary | ICD-10-CM | POA: Diagnosis not present

## 2013-06-07 DIAGNOSIS — I10 Essential (primary) hypertension: Secondary | ICD-10-CM

## 2013-06-07 DIAGNOSIS — K117 Disturbances of salivary secretion: Secondary | ICD-10-CM

## 2013-06-07 DIAGNOSIS — M2550 Pain in unspecified joint: Secondary | ICD-10-CM | POA: Diagnosis not present

## 2013-06-07 DIAGNOSIS — M545 Low back pain, unspecified: Secondary | ICD-10-CM

## 2013-06-07 DIAGNOSIS — E119 Type 2 diabetes mellitus without complications: Secondary | ICD-10-CM | POA: Diagnosis not present

## 2013-06-07 DIAGNOSIS — N390 Urinary tract infection, site not specified: Secondary | ICD-10-CM

## 2013-06-07 DIAGNOSIS — E039 Hypothyroidism, unspecified: Secondary | ICD-10-CM | POA: Diagnosis not present

## 2013-06-07 DIAGNOSIS — Z5189 Encounter for other specified aftercare: Secondary | ICD-10-CM

## 2013-06-07 DIAGNOSIS — E669 Obesity, unspecified: Secondary | ICD-10-CM

## 2013-06-07 DIAGNOSIS — R609 Edema, unspecified: Secondary | ICD-10-CM

## 2013-06-07 DIAGNOSIS — E785 Hyperlipidemia, unspecified: Secondary | ICD-10-CM

## 2013-06-07 DIAGNOSIS — R079 Chest pain, unspecified: Secondary | ICD-10-CM

## 2013-06-07 DIAGNOSIS — K224 Dyskinesia of esophagus: Secondary | ICD-10-CM

## 2013-06-07 DIAGNOSIS — M791 Myalgia, unspecified site: Secondary | ICD-10-CM

## 2013-06-07 DIAGNOSIS — T50905D Adverse effect of unspecified drugs, medicaments and biological substances, subsequent encounter: Secondary | ICD-10-CM

## 2013-06-07 MED ORDER — FUROSEMIDE 40 MG PO TABS: ORAL_TABLET | ORAL | Status: DC

## 2013-06-07 MED ORDER — DULOXETINE HCL 30 MG PO CPEP: 30.0000 mg | ORAL_CAPSULE | Freq: Every day | ORAL | Status: DC
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
## 2013-06-07 NOTE — Patient Instructions (Signed)
Continue current medications. Add Cymbalta for your pains. Add furosemide for the edema.

## 2013-06-08 LAB — ANA: ANA: NEGATIVE

## 2013-06-08 LAB — URIC ACID: Uric Acid: 5.5 mg/dL (ref 2.5–7.1)

## 2013-06-08 LAB — CK: Total CK: 45 U/L (ref 24–173)

## 2013-06-08 LAB — RHEUMATOID FACTOR

## 2013-06-08 LAB — SEDIMENTATION RATE: SED RATE: 26 mm/h (ref 0–40)

## 2013-07-11 DIAGNOSIS — M25559 Pain in unspecified hip: Secondary | ICD-10-CM | POA: Diagnosis not present

## 2013-07-11 DIAGNOSIS — M25519 Pain in unspecified shoulder: Secondary | ICD-10-CM | POA: Diagnosis not present

## 2013-07-11 DIAGNOSIS — M961 Postlaminectomy syndrome, not elsewhere classified: Secondary | ICD-10-CM | POA: Diagnosis not present

## 2013-07-25 ENCOUNTER — Ambulatory Visit: Payer: Medicare Other | Admitting: Internal Medicine

## 2013-08-01 ENCOUNTER — Ambulatory Visit: Payer: Medicare Other | Admitting: Internal Medicine

## 2013-08-22 ENCOUNTER — Ambulatory Visit (INDEPENDENT_AMBULATORY_CARE_PROVIDER_SITE_OTHER): Payer: Medicare Other | Admitting: Internal Medicine

## 2013-08-22 ENCOUNTER — Encounter: Payer: Self-pay | Admitting: Internal Medicine

## 2013-08-22 VITALS — BP 118/76 | HR 61 | Resp 10 | Ht 62.0 in | Wt 183.0 lb

## 2013-08-22 DIAGNOSIS — R609 Edema, unspecified: Secondary | ICD-10-CM

## 2013-08-22 DIAGNOSIS — M25559 Pain in unspecified hip: Secondary | ICD-10-CM | POA: Insufficient documentation

## 2013-08-22 DIAGNOSIS — E119 Type 2 diabetes mellitus without complications: Secondary | ICD-10-CM | POA: Diagnosis not present

## 2013-08-22 DIAGNOSIS — R682 Dry mouth, unspecified: Principal | ICD-10-CM

## 2013-08-22 DIAGNOSIS — M545 Low back pain, unspecified: Secondary | ICD-10-CM | POA: Diagnosis not present

## 2013-08-22 DIAGNOSIS — G894 Chronic pain syndrome: Secondary | ICD-10-CM

## 2013-08-22 DIAGNOSIS — E039 Hypothyroidism, unspecified: Secondary | ICD-10-CM | POA: Diagnosis not present

## 2013-08-22 DIAGNOSIS — K117 Disturbances of salivary secretion: Secondary | ICD-10-CM

## 2013-08-22 DIAGNOSIS — I10 Essential (primary) hypertension: Secondary | ICD-10-CM | POA: Diagnosis not present

## 2013-08-22 DIAGNOSIS — R079 Chest pain, unspecified: Secondary | ICD-10-CM | POA: Diagnosis not present

## 2013-08-22 DIAGNOSIS — E785 Hyperlipidemia, unspecified: Secondary | ICD-10-CM | POA: Diagnosis not present

## 2013-08-22 NOTE — Progress Notes (Signed)
Patient ID: [name], female   DOB: 1941-05-31, 73 y.o.   MRN: [id number]    Location:    [facility]  Place of Service:  OFFICE   Allergies  Allergen Reactions  . Ciprofloxacin Rash  . Advair Diskus [Fluticasone-Salmeterol] Other (See Comments)    Throat closes  . Albuterol     REACTION: closes throat  . Alendronate Sodium Itching  . Benadryl [Diphenhydramine Hcl]   . Benzonatate     REACTION: rash/hives  . Cephalexin Nausea Only  . Gabapentin Other (See Comments)    Loss of memory  . Iohexol      Desc: rash and DIF BREATHING   . Keflex [Cephalexin]   . Morphine Nausea And Vomiting  . Other     Decongestants  . Propoxyphene Hcl   . Reclast [Zoledronic Acid] Other (See Comments)    Chest pain  . Avapro [Irbesartan] Rash  . Codeine Nausea And Vomiting, Swelling and Rash  . Tessalon Perles Rash  . Tramadol Nausea Only and Rash    Chief Complaint  Patient presents with  . Medical Managment of Chronic Issues    6 week follow-up, labs completed Jan 2015 . Patient was unable to take Cymbalta, took x 30 days, medication caused patient to feel like a zombie . DM managed by Dr.South- Dr.South will perform foot exam  . Blood Pressure    patient thinks her B/P drops low at times    HPI:   Xerostomia using Biotene  Hypertension: controlled  Type II or unspecified type diabetes mellitus without mention of complication, not stated as uncontrolled: followed by Dr. Forde Dandy  Lumbago: using essential oils  Hip pain: both hips. Back pains involve the left hip. Has seen Dr. Berenice Primas and he gave her Morphine, but she did not use it.  Hears a "zoop" noise in her head several times daily. Lasts less than a second up to 2 seconds   Medications: Patient's Medications  New Prescriptions   No medications on file  Previous Medications   ASPIRIN 81 MG TABLET    Take 81 mg by mouth daily.    CETIRIZINE (ZYRTEC) 10 MG TABLET    Take 10 mg by mouth daily. For allergies   CHOLECALCIFEROL  (VITAMIN D3) 5000 UNITS CAPS    Take 1 tablet by mouth daily. For supplement    CITALOPRAM (CELEXA) 40 MG TABLET    Take 1 tablet (40 mg total) by mouth daily.   FLUTICASONE (FLONASE) 50 MCG/ACT NASAL SPRAY    Place 1 spray into the nose daily as needed. In each nostril   FUROSEMIDE (LASIX) 40 MG TABLET    One each morning to treat edema   GLIPIZIDE (GLUCOTROL XL) 10 MG 24 HR TABLET    Take 1 tablet (10 mg total) by mouth 2 (two) times daily. Take in the morning and evening to control blood sugar   INSULIN NPH (HUMULIN N,NOVOLIN N) 100 UNIT/ML INJECTION    Inject into the skin as directed. 30 units in the morning and 30 units in the evening to control diabetes.   INSULIN REGULAR (HUMULIN R) 100 UNITS/ML INJECTION    Inject into the skin. Per Sliding scale- as needed   LOSARTAN-HYDROCHLOROTHIAZIDE (HYZAAR) 50-12.5 MG PER TABLET    Take 1 tablet by mouth daily. To control blood presure   MAGNESIUM OXIDE 250 MG TABS    Take 250 mg by mouth daily.    NITROGLYCERIN (NITROSTAT) 0.4 MG SL TABLET    Place 1  tablet (0.4 mg total) under the tongue every 5 (five) minutes as needed. For chest pain. Maximum of 3 tablets in 15 minutes.   OMEPRAZOLE (PRILOSEC) 40 MG CAPSULE    Take 40 mg by mouth daily.  Modified Medications   No medications on file  Discontinued Medications   AMOXICILLIN-CLAVULANATE (AUGMENTIN) 875-125 MG PER TABLET    Take 1 tablet by mouth 2 (two) times daily.   DULOXETINE (CYMBALTA) 30 MG CAPSULE    Take 1 capsule (30 mg total) by mouth daily.     Review of Systems  Constitutional: Positive for fatigue. Negative for fever, diaphoresis and unexpected weight change.       Obese gaining weight. Patient had previous bypass stomach surgery, but has failed to sustain the weight that was lost after surgery.  HENT: Negative.   Eyes: Negative.   Respiratory: Negative.   Cardiovascular: Negative for chest pain, palpitations and leg swelling.  Gastrointestinal: Negative.   Endocrine:        Diabetic.  Genitourinary:       History of vaginal itching post antibiotic treatment. Generally does better with antifungal tablet.  Musculoskeletal:       Complaints of muscular weakness. Degenerative joint disease symptoms are present. He has a history of gout. There is some restriction in joint motion and stiffness present in multiple joints. She has a chronic backache. She is unstable when walking.  Skin:       Dry, itchy skin.  Allergic/Immunologic: Negative.   Neurological:       Chronic amounts difficulties. Walks wobbly and unsteady. History of headaches including migraine headaches.  Hematological: Negative.   Psychiatric/Behavioral:       Depressive symptoms. History of memory problems. Increased stress. Nervous. Marriage is somewhat rocky.    Filed Vitals:   08/22/13 1501  BP: 118/76  Pulse: 61  Resp: 10  Height: 5\' 2"  (1.575 m)  Weight: 183 lb (83.008 kg)  SpO2: 96%   Physical Exam  Constitutional: She is oriented to person, place, and time. She appears distressed.  Wheezy and mildly distressed.  HENT:  Head: Normocephalic and atraumatic.  Right Ear: External ear normal.  Left Ear: External ear normal.  Nose: Nose normal.  Mouth/Throat: Oropharynx is clear and moist.  Eyes: Conjunctivae and EOM are normal. Pupils are equal, round, and reactive to light.  Neck: No JVD present. No tracheal deviation present. No thyromegaly present.  Cardiovascular: Normal rate, regular rhythm, normal heart sounds and intact distal pulses.   Pulmonary/Chest: No respiratory distress. She has no wheezes. She has no rales. She exhibits no tenderness.  Abdominal: She exhibits no distension and no mass. There is no tenderness.  Musculoskeletal: She exhibits edema (tight 2-3 + bilaterally) and tenderness.  Unstable gait. Multiple joints are tender but do not appear to be inflamed. Lower back discomfort to palpation.  Lymphadenopathy:    She has no cervical adenopathy.  Neurological: She  is alert and oriented to person, place, and time. She has normal reflexes. A cranial nerve deficit is present. Coordination normal.  Skin:  Single, soft, mobile nodule in the right supraclavicular area 6 mm x 10 mm diameter.  Psychiatric: Judgment and thought content normal.  Seems depressed and anxious.     Labs reviewed: Office Visit on 06/07/2013  Component Date Value Ref Range Status  . Rheumatoid Factor 06/07/2013 <7.0  0.0 - 13.9 IU/mL Final  . Total CK 06/07/2013 45  24 - 173 U/L Final  . Sed Rate 06/07/2013 26  0 - 40 mm/hr Final  . ANA 06/07/2013 Negative  Negative Final  . Uric Acid 06/07/2013 5.5  2.5 - 7.1 mg/dL Final              Therapeutic target for gout patients: <6.0  Abstract on 06/05/2013  Component Date Value Ref Range Status  . HM Mammogram 02/25/2012 nl   Final  . HM Colonoscopy 09/12/2001 [facility], repeat 5 years   Final  . HM Dexa Scan 01/18/2012 Osteopenia   Final  Appointment on 06/05/2013  Component Date Value Ref Range Status  . TSH 06/05/2013 2.200  0.450 - 4.500 uIU/mL Final  . Cholesterol, Total 06/05/2013 216* 100 - 199 mg/dL Final  . Triglycerides 06/05/2013 179* 0 - 149 mg/dL Final  . HDL 06/05/2013 44  >39 mg/dL Final   Comment: According to ATP-III Guidelines, HDL-C >59 mg/dL is considered a                          negative risk factor for CHD.  Marland Kitchen VLDL Cholesterol Cal 06/05/2013 36  5 - 40 mg/dL Final  . LDL Calculated 06/05/2013 136* 0 - 99 mg/dL Final  . Chol/HDL Ratio 06/05/2013 4.9* 0.0 - 4.4 ratio units Final   Comment:                                   T. Chol/HDL Ratio                                                                      Men  Women                                                        1/2 Avg.Risk  3.4    3.3                                                            Avg.Risk  5.0    4.4                                                         2X Avg.Risk  9.6    7.1                                                          3X Avg.Risk 23.4   11.0  . Hemoglobin A1C 06/05/2013 8.5* 4.8 - 5.6 % Final   Comment:  Increased risk for diabetes: 5.7 - 6.4                                   Diabetes: >6.4                                   Glycemic control for adults with diabetes: <7.0  . Estimated average glucose 06/05/2013 197   Final  . Glucose 06/05/2013 132* 65 - 99 mg/dL Final  . BUN 06/05/2013 17  8 - 27 mg/dL Final  . Creatinine, Ser 06/05/2013 0.83  0.57 - 1.00 mg/dL Final  . GFR calc non Af Amer 06/05/2013 70  >59 mL/min/1.73 Final  . GFR calc Af Amer 06/05/2013 80  >59 mL/min/1.73 Final  . BUN/Creatinine Ratio 06/05/2013 20  11 - 26 Final  . Sodium 06/05/2013 140  134 - 144 mmol/L Final  . Potassium 06/05/2013 4.0  3.5 - 5.2 mmol/L Final  . Chloride 06/05/2013 99  97 - 108 mmol/L Final  . CO2 06/05/2013 24  18 - 29 mmol/L Final  . Calcium 06/05/2013 9.2  8.6 - 10.2 mg/dL Final   Comment: **Effective June 19, 2013 the reference interval**                            for Calcium, Serum will be changing to:                                       Age                Female          Female                                    0 - 10 days        8.6 - 10.4     8.6 - 10.4                              11 days -  1 year        9.2 - 11.0     9.2 - 11.0                                    2 - 11 years       9.1 - 10.5     9.1 - 10.5                                   12 - 17 years       8.9 - 10.4     8.9 - 10.4                                   18 - 59 years       8.7 - 10.2     8.7 - 10.2                                       >  59 years       8.6 - 10.2     8.7 - 10.3  . Total Protein 06/05/2013 6.3  6.0 - 8.5 g/dL Final  . Albumin 06/05/2013 3.9  3.5 - 4.8 g/dL Final  . Globulin, Total 06/05/2013 2.4  1.5 - 4.5 g/dL Final  . Albumin/Globulin Ratio 06/05/2013 1.6  1.1 - 2.5 Final  . Total Bilirubin 06/05/2013 0.5  0.0 - 1.2 mg/dL Final  . Alkaline Phosphatase 06/05/2013 89  39 - 117 IU/L Final  . AST  06/05/2013 17  0 - 40 IU/L Final  . ALT 06/05/2013 14  0 - 32 IU/L Final      Assessment/Plan  1. Xerostomia Continue oral moisturizers  2. Hypertension controlled  3. Type II or unspecified type diabetes mellitus without mention of complication, not stated as uncontrolled controlled  4. Lumbago Chronic and unchanged  5. Hip pain Chronic and unchanged. 6. Chronic pain syndrome Offered referral to pain management or to ortho, but she is not interested at this time.  7. Edema controlled

## 2013-08-22 NOTE — Patient Instructions (Signed)
Continue medications as listed 

## 2013-09-08 ENCOUNTER — Telehealth: Payer: Self-pay

## 2013-09-08 NOTE — Telephone Encounter (Signed)
Wants to talk to Dr. Nyoka Cowden, personal. Will not give info

## 2013-09-08 NOTE — Telephone Encounter (Signed)
She wanted to send me information about essential oils. I told her I would be glad to receive and will review any information she has.

## 2013-09-20 ENCOUNTER — Encounter: Payer: Self-pay | Admitting: Internal Medicine

## 2013-10-02 DIAGNOSIS — E1139 Type 2 diabetes mellitus with other diabetic ophthalmic complication: Secondary | ICD-10-CM | POA: Diagnosis not present

## 2013-10-02 DIAGNOSIS — E11339 Type 2 diabetes mellitus with moderate nonproliferative diabetic retinopathy without macular edema: Secondary | ICD-10-CM | POA: Diagnosis not present

## 2013-10-16 DIAGNOSIS — M5137 Other intervertebral disc degeneration, lumbosacral region: Secondary | ICD-10-CM | POA: Diagnosis not present

## 2013-10-16 DIAGNOSIS — M999 Biomechanical lesion, unspecified: Secondary | ICD-10-CM | POA: Diagnosis not present

## 2013-10-17 DIAGNOSIS — M5137 Other intervertebral disc degeneration, lumbosacral region: Secondary | ICD-10-CM | POA: Diagnosis not present

## 2013-10-17 DIAGNOSIS — M999 Biomechanical lesion, unspecified: Secondary | ICD-10-CM | POA: Diagnosis not present

## 2013-10-18 DIAGNOSIS — M5137 Other intervertebral disc degeneration, lumbosacral region: Secondary | ICD-10-CM | POA: Diagnosis not present

## 2013-10-18 DIAGNOSIS — M999 Biomechanical lesion, unspecified: Secondary | ICD-10-CM | POA: Diagnosis not present

## 2013-10-31 DIAGNOSIS — M5137 Other intervertebral disc degeneration, lumbosacral region: Secondary | ICD-10-CM | POA: Diagnosis not present

## 2013-10-31 DIAGNOSIS — M999 Biomechanical lesion, unspecified: Secondary | ICD-10-CM | POA: Diagnosis not present

## 2013-11-01 DIAGNOSIS — M5137 Other intervertebral disc degeneration, lumbosacral region: Secondary | ICD-10-CM | POA: Diagnosis not present

## 2013-11-01 DIAGNOSIS — M999 Biomechanical lesion, unspecified: Secondary | ICD-10-CM | POA: Diagnosis not present

## 2013-11-02 DIAGNOSIS — M5137 Other intervertebral disc degeneration, lumbosacral region: Secondary | ICD-10-CM | POA: Diagnosis not present

## 2013-11-02 DIAGNOSIS — M999 Biomechanical lesion, unspecified: Secondary | ICD-10-CM | POA: Diagnosis not present

## 2013-11-08 DIAGNOSIS — M5137 Other intervertebral disc degeneration, lumbosacral region: Secondary | ICD-10-CM | POA: Diagnosis not present

## 2013-11-08 DIAGNOSIS — M999 Biomechanical lesion, unspecified: Secondary | ICD-10-CM | POA: Diagnosis not present

## 2013-11-09 DIAGNOSIS — D1801 Hemangioma of skin and subcutaneous tissue: Secondary | ICD-10-CM | POA: Diagnosis not present

## 2013-11-09 DIAGNOSIS — L905 Scar conditions and fibrosis of skin: Secondary | ICD-10-CM | POA: Diagnosis not present

## 2013-11-09 DIAGNOSIS — L819 Disorder of pigmentation, unspecified: Secondary | ICD-10-CM | POA: Diagnosis not present

## 2013-11-09 DIAGNOSIS — L820 Inflamed seborrheic keratosis: Secondary | ICD-10-CM | POA: Diagnosis not present

## 2013-11-09 DIAGNOSIS — L821 Other seborrheic keratosis: Secondary | ICD-10-CM | POA: Diagnosis not present

## 2013-11-09 DIAGNOSIS — L570 Actinic keratosis: Secondary | ICD-10-CM | POA: Diagnosis not present

## 2013-11-09 DIAGNOSIS — D239 Other benign neoplasm of skin, unspecified: Secondary | ICD-10-CM | POA: Diagnosis not present

## 2013-11-14 DIAGNOSIS — M5137 Other intervertebral disc degeneration, lumbosacral region: Secondary | ICD-10-CM | POA: Diagnosis not present

## 2013-11-14 DIAGNOSIS — M999 Biomechanical lesion, unspecified: Secondary | ICD-10-CM | POA: Diagnosis not present

## 2013-11-21 DIAGNOSIS — M5137 Other intervertebral disc degeneration, lumbosacral region: Secondary | ICD-10-CM | POA: Diagnosis not present

## 2013-11-21 DIAGNOSIS — M999 Biomechanical lesion, unspecified: Secondary | ICD-10-CM | POA: Diagnosis not present

## 2013-11-23 DIAGNOSIS — M5137 Other intervertebral disc degeneration, lumbosacral region: Secondary | ICD-10-CM | POA: Diagnosis not present

## 2013-11-23 DIAGNOSIS — M999 Biomechanical lesion, unspecified: Secondary | ICD-10-CM | POA: Diagnosis not present

## 2013-11-25 ENCOUNTER — Other Ambulatory Visit: Payer: Self-pay | Admitting: Internal Medicine

## 2013-11-27 DIAGNOSIS — M5137 Other intervertebral disc degeneration, lumbosacral region: Secondary | ICD-10-CM | POA: Diagnosis not present

## 2013-11-27 DIAGNOSIS — M999 Biomechanical lesion, unspecified: Secondary | ICD-10-CM | POA: Diagnosis not present

## 2013-12-18 DIAGNOSIS — M999 Biomechanical lesion, unspecified: Secondary | ICD-10-CM | POA: Diagnosis not present

## 2013-12-18 DIAGNOSIS — M5137 Other intervertebral disc degeneration, lumbosacral region: Secondary | ICD-10-CM | POA: Diagnosis not present

## 2014-02-14 ENCOUNTER — Ambulatory Visit: Payer: Medicare Other | Admitting: Internal Medicine

## 2014-02-14 ENCOUNTER — Encounter: Payer: Self-pay | Admitting: Internal Medicine

## 2014-02-26 ENCOUNTER — Other Ambulatory Visit: Payer: Self-pay | Admitting: Internal Medicine

## 2014-02-27 ENCOUNTER — Other Ambulatory Visit: Payer: Self-pay | Admitting: Internal Medicine

## 2014-04-11 ENCOUNTER — Ambulatory Visit: Payer: Medicare Other | Admitting: Internal Medicine

## 2014-05-09 ENCOUNTER — Other Ambulatory Visit: Payer: Self-pay | Admitting: Nurse Practitioner

## 2014-05-24 ENCOUNTER — Other Ambulatory Visit: Payer: Self-pay | Admitting: Internal Medicine

## 2014-05-27 DIAGNOSIS — R069 Unspecified abnormalities of breathing: Secondary | ICD-10-CM | POA: Diagnosis not present

## 2014-05-28 ENCOUNTER — Emergency Department (HOSPITAL_COMMUNITY): Payer: Medicare Other

## 2014-05-28 ENCOUNTER — Inpatient Hospital Stay (HOSPITAL_COMMUNITY)
Admission: EM | Admit: 2014-05-28 | Discharge: 2014-06-03 | DRG: 194 | Disposition: A | Discharge status: Home / Self Care | Payer: Medicare Other | Attending: Internal Medicine | Admitting: Internal Medicine

## 2014-05-28 ENCOUNTER — Encounter (HOSPITAL_COMMUNITY): Payer: Self-pay | Admitting: *Deleted

## 2014-05-28 DIAGNOSIS — Z888 Allergy status to other drugs, medicaments and biological substances status: Secondary | ICD-10-CM | POA: Diagnosis not present

## 2014-05-28 DIAGNOSIS — T380X5A Adverse effect of glucocorticoids and synthetic analogues, initial encounter: Secondary | ICD-10-CM | POA: Diagnosis present

## 2014-05-28 DIAGNOSIS — K224 Dyskinesia of esophagus: Secondary | ICD-10-CM | POA: Diagnosis present

## 2014-05-28 DIAGNOSIS — I10 Essential (primary) hypertension: Secondary | ICD-10-CM | POA: Diagnosis present

## 2014-05-28 DIAGNOSIS — K219 Gastro-esophageal reflux disease without esophagitis: Secondary | ICD-10-CM | POA: Diagnosis present

## 2014-05-28 DIAGNOSIS — Z881 Allergy status to other antibiotic agents status: Secondary | ICD-10-CM

## 2014-05-28 DIAGNOSIS — Z79899 Other long term (current) drug therapy: Secondary | ICD-10-CM

## 2014-05-28 DIAGNOSIS — R059 Cough, unspecified: Secondary | ICD-10-CM | POA: Insufficient documentation

## 2014-05-28 DIAGNOSIS — Z833 Family history of diabetes mellitus: Secondary | ICD-10-CM

## 2014-05-28 DIAGNOSIS — R7989 Other specified abnormal findings of blood chemistry: Secondary | ICD-10-CM | POA: Diagnosis not present

## 2014-05-28 DIAGNOSIS — Z885 Allergy status to narcotic agent status: Secondary | ICD-10-CM | POA: Diagnosis not present

## 2014-05-28 DIAGNOSIS — Z823 Family history of stroke: Secondary | ICD-10-CM

## 2014-05-28 DIAGNOSIS — E669 Obesity, unspecified: Secondary | ICD-10-CM | POA: Diagnosis present

## 2014-05-28 DIAGNOSIS — J45901 Unspecified asthma with (acute) exacerbation: Secondary | ICD-10-CM | POA: Diagnosis present

## 2014-05-28 DIAGNOSIS — E539 Vitamin B deficiency, unspecified: Secondary | ICD-10-CM | POA: Diagnosis present

## 2014-05-28 DIAGNOSIS — E785 Hyperlipidemia, unspecified: Secondary | ICD-10-CM | POA: Diagnosis present

## 2014-05-28 DIAGNOSIS — K210 Gastro-esophageal reflux disease with esophagitis: Secondary | ICD-10-CM | POA: Diagnosis present

## 2014-05-28 DIAGNOSIS — Z9884 Bariatric surgery status: Secondary | ICD-10-CM

## 2014-05-28 DIAGNOSIS — M109 Gout, unspecified: Secondary | ICD-10-CM | POA: Diagnosis present

## 2014-05-28 DIAGNOSIS — E039 Hypothyroidism, unspecified: Secondary | ICD-10-CM | POA: Diagnosis present

## 2014-05-28 DIAGNOSIS — Z809 Family history of malignant neoplasm, unspecified: Secondary | ICD-10-CM | POA: Diagnosis not present

## 2014-05-28 DIAGNOSIS — J383 Other diseases of vocal cords: Secondary | ICD-10-CM | POA: Diagnosis not present

## 2014-05-28 DIAGNOSIS — J449 Chronic obstructive pulmonary disease, unspecified: Secondary | ICD-10-CM | POA: Diagnosis present

## 2014-05-28 DIAGNOSIS — R05 Cough: Secondary | ICD-10-CM | POA: Diagnosis not present

## 2014-05-28 DIAGNOSIS — Z7982 Long term (current) use of aspirin: Secondary | ICD-10-CM | POA: Diagnosis not present

## 2014-05-28 DIAGNOSIS — E119 Type 2 diabetes mellitus without complications: Secondary | ICD-10-CM | POA: Diagnosis not present

## 2014-05-28 DIAGNOSIS — R0602 Shortness of breath: Secondary | ICD-10-CM | POA: Diagnosis not present

## 2014-05-28 DIAGNOSIS — Z8249 Family history of ischemic heart disease and other diseases of the circulatory system: Secondary | ICD-10-CM | POA: Diagnosis not present

## 2014-05-28 DIAGNOSIS — R061 Stridor: Secondary | ICD-10-CM | POA: Diagnosis not present

## 2014-05-28 DIAGNOSIS — F329 Major depressive disorder, single episode, unspecified: Secondary | ICD-10-CM | POA: Diagnosis present

## 2014-05-28 DIAGNOSIS — E1121 Type 2 diabetes mellitus with diabetic nephropathy: Secondary | ICD-10-CM

## 2014-05-28 DIAGNOSIS — Z6833 Body mass index (BMI) 33.0-33.9, adult: Secondary | ICD-10-CM | POA: Diagnosis not present

## 2014-05-28 DIAGNOSIS — J189 Pneumonia, unspecified organism: Principal | ICD-10-CM | POA: Diagnosis present

## 2014-05-28 DIAGNOSIS — E780 Pure hypercholesterolemia: Secondary | ICD-10-CM | POA: Diagnosis present

## 2014-05-28 DIAGNOSIS — E1165 Type 2 diabetes mellitus with hyperglycemia: Secondary | ICD-10-CM | POA: Diagnosis present

## 2014-05-28 DIAGNOSIS — Z794 Long term (current) use of insulin: Secondary | ICD-10-CM

## 2014-05-28 DIAGNOSIS — R748 Abnormal levels of other serum enzymes: Secondary | ICD-10-CM | POA: Diagnosis present

## 2014-05-28 DIAGNOSIS — R062 Wheezing: Secondary | ICD-10-CM | POA: Diagnosis not present

## 2014-05-28 DIAGNOSIS — R778 Other specified abnormalities of plasma proteins: Secondary | ICD-10-CM | POA: Insufficient documentation

## 2014-05-28 DIAGNOSIS — R0609 Other forms of dyspnea: Secondary | ICD-10-CM | POA: Diagnosis not present

## 2014-05-28 LAB — BRAIN NATRIURETIC PEPTIDE: B Natriuretic Peptide: 19.4 pg/mL (ref 0.0–100.0)

## 2014-05-28 LAB — GLUCOSE, CAPILLARY
GLUCOSE-CAPILLARY: 201 mg/dL — AB (ref 70–99)
GLUCOSE-CAPILLARY: 169 mg/dL — AB (ref 70–99)
Glucose-Capillary: 55 mg/dL — ABNORMAL LOW (ref 70–99)
Glucose-Capillary: 147 mg/dL — ABNORMAL HIGH (ref 70–99)
Glucose-Capillary: 181 mg/dL — ABNORMAL HIGH (ref 70–99)

## 2014-05-28 LAB — CBC WITH DIFFERENTIAL/PLATELET
Basophils Absolute: 0 10*3/uL (ref 0.0–0.1)
Basophils Relative: 0 % (ref 0–1)
EOS PCT: 1 % (ref 0–5)
Eosinophils Absolute: 0.1 10*3/uL (ref 0.0–0.7)
HCT: 38.1 % (ref 36.0–46.0)
Hemoglobin: 11.5 g/dL — ABNORMAL LOW (ref 12.0–15.0)
LYMPHS ABS: 1.3 10*3/uL (ref 0.7–4.0)
LYMPHS PCT: 23 % (ref 12–46)
MCH: 25.6 pg — ABNORMAL LOW (ref 26.0–34.0)
MCHC: 30.2 g/dL (ref 30.0–36.0)
MCV: 84.7 fL (ref 78.0–100.0)
MONOS PCT: 7 % (ref 3–12)
Monocytes Absolute: 0.4 10*3/uL (ref 0.1–1.0)
NEUTROS ABS: 4 10*3/uL (ref 1.7–7.7)
Neutrophils Relative %: 69 % (ref 43–77)
Platelets: 276 10*3/uL (ref 150–400)
RBC: 4.5 MIL/uL (ref 3.87–5.11)
RDW: 15.7 % — ABNORMAL HIGH (ref 11.5–15.5)
WBC: 5.8 10*3/uL (ref 4.0–10.5)

## 2014-05-28 LAB — TROPONIN I
TROPONIN I: 0.11 ng/mL — AB (ref <0.031)
Troponin I: 0.07 ng/mL — ABNORMAL HIGH

## 2014-05-28 LAB — BASIC METABOLIC PANEL
Anion gap: 7 (ref 5–15)
BUN: 17 mg/dL (ref 6–23)
CHLORIDE: 104 meq/L (ref 96–112)
CO2: 25 mmol/L (ref 19–32)
CREATININE: 0.83 mg/dL (ref 0.50–1.10)
Calcium: 8.5 mg/dL (ref 8.4–10.5)
GFR calc non Af Amer: 67 mL/min — ABNORMAL LOW (ref >90)
GFR, EST AFRICAN AMERICAN: 78 mL/min — AB (ref >90)
Glucose, Bld: 276 mg/dL — ABNORMAL HIGH (ref 70–99)
POTASSIUM: 4 mmol/L (ref 3.5–5.1)
Sodium: 136 mmol/L (ref 135–145)

## 2014-05-28 LAB — HIV ANTIBODY (ROUTINE TESTING W REFLEX): HIV 1&2 Ab, 4th Generation: NONREACTIVE

## 2014-05-28 LAB — CBG MONITORING, ED: GLUCOSE-CAPILLARY: 244 mg/dL — AB (ref 70–99)

## 2014-05-28 MED ORDER — MAGNESIUM OXIDE 250 MG PO TABS
250.0000 mg | ORAL_TABLET | Freq: Every day | ORAL | Status: DC
Start: 2014-05-28 — End: 2014-05-28

## 2014-05-28 MED ORDER — INSULIN ASPART 100 UNIT/ML ~~LOC~~ SOLN
0.0000 [IU] | Freq: Three times a day (TID) | SUBCUTANEOUS | Status: DC
  Administered 2014-05-28 (×2): 3 [IU] via SUBCUTANEOUS
  Administered 2014-05-29: 1 [IU] via SUBCUTANEOUS
  Administered 2014-05-29: 5 [IU] via SUBCUTANEOUS
  Administered 2014-05-30: 7 [IU] via SUBCUTANEOUS

## 2014-05-28 MED ORDER — DEXTROSE 5 % IV SOLN
500.0000 mg | INTRAVENOUS | Status: DC
  Administered 2014-05-29 – 2014-06-02 (×5): 500 mg via INTRAVENOUS
  Filled 2014-05-28 (×5): qty 500

## 2014-05-28 MED ORDER — MAGNESIUM OXIDE 400 (241.3 MG) MG PO TABS
200.0000 mg | ORAL_TABLET | Freq: Every day | ORAL | Status: DC
  Administered 2014-05-28 – 2014-06-03 (×7): 200 mg via ORAL
  Filled 2014-05-28 (×7): qty 0.5

## 2014-05-28 MED ORDER — INSULIN NPH (HUMAN) (ISOPHANE) 100 UNIT/ML ~~LOC~~ SUSP
20.0000 [IU] | Freq: Two times a day (BID) | SUBCUTANEOUS | Status: DC
  Administered 2014-05-28 – 2014-05-30 (×5): 20 [IU] via SUBCUTANEOUS
  Filled 2014-05-28 (×2): qty 10

## 2014-05-28 MED ORDER — LORATADINE 10 MG PO TABS
10.0000 mg | ORAL_TABLET | Freq: Every day | ORAL | Status: DC
  Administered 2014-05-28 – 2014-06-03 (×7): 10 mg via ORAL
  Filled 2014-05-28 (×7): qty 1

## 2014-05-28 MED ORDER — NITROGLYCERIN 0.4 MG SL SUBL: 0.4000 mg | SUBLINGUAL_TABLET | SUBLINGUAL | Status: DC | PRN

## 2014-05-28 MED ORDER — ASPIRIN 81 MG PO CHEW
81.0000 mg | CHEWABLE_TABLET | Freq: Every day | ORAL | Status: DC
  Administered 2014-05-28 – 2014-06-03 (×7): 81 mg via ORAL
  Filled 2014-05-28 (×7): qty 1

## 2014-05-28 MED ORDER — LOSARTAN POTASSIUM 50 MG PO TABS
50.0000 mg | ORAL_TABLET | Freq: Every day | ORAL | Status: DC
  Administered 2014-05-28 – 2014-06-03 (×7): 50 mg via ORAL
  Filled 2014-05-28 (×7): qty 1

## 2014-05-28 MED ORDER — DEXTROSE 5 % IV SOLN
1.0000 g | Freq: Once | INTRAVENOUS | Status: AC
  Administered 2014-05-28: 1 g via INTRAVENOUS
  Filled 2014-05-28: qty 10

## 2014-05-28 MED ORDER — VITAMIN D3 125 MCG (5000 UT) PO CAPS: 1.0000 | ORAL_CAPSULE | Freq: Every day | ORAL | Status: DC

## 2014-05-28 MED ORDER — FUROSEMIDE 40 MG PO TABS
40.0000 mg | ORAL_TABLET | Freq: Every day | ORAL | Status: DC
  Filled 2014-05-28 (×6): qty 1

## 2014-05-28 MED ORDER — LOSARTAN POTASSIUM-HCTZ 50-12.5 MG PO TABS: 1.0000 | ORAL_TABLET | Freq: Every day | ORAL | Status: DC

## 2014-05-28 MED ORDER — ASPIRIN 81 MG PO TABS: 81.0000 mg | ORAL_TABLET | Freq: Every day | ORAL | Status: DC

## 2014-05-28 MED ORDER — FLUTICASONE PROPIONATE 50 MCG/ACT NA SUSP: 1.0000 | Freq: Every day | NASAL | Status: DC | PRN

## 2014-05-28 MED ORDER — GLIPIZIDE ER 10 MG PO TB24
10.0000 mg | ORAL_TABLET | Freq: Two times a day (BID) | ORAL | Status: DC
  Administered 2014-05-28 – 2014-06-03 (×13): 10 mg via ORAL
  Filled 2014-05-28 (×16): qty 1

## 2014-05-28 MED ORDER — VITAMIN D3 25 MCG (1000 UNIT) PO TABS
5000.0000 [IU] | ORAL_TABLET | Freq: Every day | ORAL | Status: DC
  Administered 2014-05-28 – 2014-06-03 (×7): 5000 [IU] via ORAL
  Filled 2014-05-28 (×7): qty 5

## 2014-05-28 MED ORDER — HYDROCHLOROTHIAZIDE 12.5 MG PO CAPS
12.5000 mg | ORAL_CAPSULE | Freq: Every day | ORAL | Status: DC
  Administered 2014-05-28 – 2014-06-03 (×7): 12.5 mg via ORAL
  Filled 2014-05-28 (×7): qty 1

## 2014-05-28 MED ORDER — CITALOPRAM HYDROBROMIDE 40 MG PO TABS
40.0000 mg | ORAL_TABLET | Freq: Every day | ORAL | Status: DC
  Administered 2014-05-28 – 2014-05-30 (×3): 40 mg via ORAL
  Filled 2014-05-28 (×4): qty 1

## 2014-05-28 MED ORDER — HEPARIN SODIUM (PORCINE) 5000 UNIT/ML IJ SOLN
5000.0000 [IU] | Freq: Three times a day (TID) | INTRAMUSCULAR | Status: DC
  Administered 2014-05-28 – 2014-06-03 (×19): 5000 [IU] via SUBCUTANEOUS
  Filled 2014-05-28 (×22): qty 1

## 2014-05-28 MED ORDER — DEXTROSE 5 % IV SOLN
1.0000 g | INTRAVENOUS | Status: DC
  Administered 2014-05-29 – 2014-06-03 (×6): 1 g via INTRAVENOUS
  Filled 2014-05-28 (×6): qty 10

## 2014-05-28 MED ORDER — INSULIN ASPART 100 UNIT/ML ~~LOC~~ SOLN
2.0000 [IU] | Freq: Three times a day (TID) | SUBCUTANEOUS | Status: DC
  Administered 2014-05-28 – 2014-06-03 (×15): 2 [IU] via SUBCUTANEOUS
  Filled 2014-05-28: qty 1

## 2014-05-28 MED ORDER — ONDANSETRON HCL 4 MG/2ML IJ SOLN: 4.0000 mg | Freq: Four times a day (QID) | INTRAMUSCULAR | Status: DC | PRN

## 2014-05-28 MED ORDER — DEXTROSE 5 % IV SOLN
500.0000 mg | Freq: Once | INTRAVENOUS | Status: AC
  Administered 2014-05-28: 500 mg via INTRAVENOUS
  Filled 2014-05-28: qty 500

## 2014-05-28 MED ORDER — PANTOPRAZOLE SODIUM 40 MG PO TBEC
80.0000 mg | DELAYED_RELEASE_TABLET | Freq: Every day | ORAL | Status: DC
  Administered 2014-05-28 – 2014-06-03 (×7): 80 mg via ORAL
  Filled 2014-05-28 (×7): qty 2

## 2014-05-28 MED ORDER — RACEPINEPHRINE HCL 2.25 % IN NEBU
0.5000 mL | INHALATION_SOLUTION | Freq: Once | RESPIRATORY_TRACT | Status: AC
  Administered 2014-05-28: 0.5 mL via RESPIRATORY_TRACT
  Filled 2014-05-28: qty 0.5

## 2014-05-28 NOTE — ED Notes (Addendum)
Awake. Verbally responsive. A/O x4. Resp even and labored with exertion. Audible exp wheezing noted. ABC's intact. SR on monitor at 87bpm. Family at bedside.

## 2014-05-28 NOTE — ED Notes (Signed)
Per EMS report: pt from home: On EMS arrival, pt had mild wheezing in which EMS tx with 2.5mg  of albuterol.  Pt was 91% RA after tx but is at 98% on 3L [place].Wheezes are no longer present. Pt normally ambulatory.  Skin warm and dry.  A/o x 4.

## 2014-05-28 NOTE — ED Notes (Signed)
Resting quietly with eye closed. Easily arousable. Verbally responsive. Resp even and unlabored. ABC's intact. SR on monitor. NAD noted.  

## 2014-05-28 NOTE — ED Notes (Signed)
Pt had no adverse reaction to IV ABT Rocephin.

## 2014-05-28 NOTE — ED Notes (Signed)
MD at bedside. Pt removed from with sats at 96% RA. Pt noted with moist productive cough.

## 2014-05-28 NOTE — ED Provider Notes (Signed)
CSN: [id number]     Arrival date & time 05/28/14  0040 History   First MD Initiated Contact with Patient 05/28/14 0144     Chief Complaint  Patient presents with  . Shortness of Breath     (Consider location/radiation/quality/duration/timing/severity/associated sxs/prior Treatment) HPI  This is a 73 year old female with multiple medical problems. She states she has been sick for the past 4-5 days with nausea, vomiting, diarrhea and cough. She became short of breath late yesterday evening which she describes as severe. She was having difficulty getting full sentences out. EMS was called and found her to be wheezing. She was administered 2.5 milligrams of albuterol by neb with resolution of the wheezing. The patient states she has an allergy to albuterol that "causes my throat closed up" because "I have a vocal cord disorder". She states she felt her throat close up after the neb treatment and had to lean her head back to relieve the discomfort. She states that she feels her voice is gravelly. She denies fever.  Past Medical History  Diagnosis Date  . Hypertension   . Diabetes mellitus   . High cholesterol   . Other specified disease of sebaceous glands   . Other B-complex deficiencies   . Depressive disorder, not elsewhere classified   . Tension headache   . Migraine without aura, with intractable migraine, so stated, with status migrainosus   . Type I (juvenile type) diabetes mellitus without mention of complication, uncontrolled   . Unspecified vitamin D deficiency   . Dyskinesia of esophagus   . Memory loss   . Mild cognitive impairment, so stated   . Other nonspecific abnormal serum enzyme levels   . Lipoma of other skin and subcutaneous tissue   . Pain in joint, shoulder region   . Nonspecific (abnormal) findings on radiological and other examination of abdominal area, including retroperitoneum   . Nonspecific abnormal results of liver function study   . Pain in joint, pelvic  region and thigh   . Pain in joint, ankle and foot   . Edema   . Lumbago   . Other symptoms involving cardiovascular system   . Abdominal pain, unspecified site   . Other specified cardiac dysrhythmias(427.89)   . Ventral hernia, unspecified, without mention of obstruction or gangrene   . Extrinsic asthma, unspecified   . Dizziness and giddiness   . Palpitations   . Shortness of breath   . Reflux esophagitis   . Obesity, unspecified   . Unspecified hypothyroidism   . Type I (juvenile type) diabetes mellitus without mention of complication, not stated as uncontrolled   . Other and unspecified hyperlipidemia   . Gout, unspecified   . Unspecified essential hypertension   . Encounter for long-term (current) use of other medications   . Abnormality of gait   . Chest pain, unspecified   . Obstructive chronic bronchitis with exacerbation   . Female stress incontinence   . Apnea   . Cardiomegaly   . Complication of anesthesia     hard time waking up   Past Surgical History  Procedure Laterality Date  . Stents      last time she said was 1 yr ago   . Breast surgery    . Appendectomy    . Abdominal hysterectomy    . Gastric bypass  2005  approx   Family History  Problem Relation Age of Onset  . Stroke Mother   . Stroke Father   . Diabetes Father   .  Heart disease Father   . Diabetes Son   . Cancer Brother     BLADDER  . Diabetes Brother    History  Substance Use Topics  . Smoking status: Never Smoker   . Smokeless tobacco: Never Used  . Alcohol Use: No   OB History    No data available     Review of Systems  All other systems reviewed and are negative.   Allergies  Ciprofloxacin; Advair diskus; Albuterol; Alendronate sodium; Benadryl; Benzonatate; Cephalexin; Gabapentin; Iohexol; Keflex; Morphine; Other; Propoxyphene hcl; Reclast; Avapro; Codeine; Tessalon perles; and Tramadol  Home Medications   Prior to Admission medications   Medication Sig Start Date End  Date Taking? Authorizing Provider  aspirin 81 MG tablet Take 81 mg by mouth daily.    Yes Historical Provider, MD  Cholecalciferol (VITAMIN D3) 5000 UNITS CAPS Take 1 tablet by mouth daily. For supplement   Yes Historical Provider, MD  citalopram (CELEXA) 40 MG tablet Take 1 tablet (40 mg total) by mouth daily. 12/06/12  Yes [name], MD  citalopram (CELEXA) 40 MG tablet TAKE 1 TABLET BY MOUTH EVERY DAY 02/27/14  Yes [name], MD  furosemide (LASIX) 40 MG tablet One each morning to treat edema Patient taking differently: Take 40 mg by mouth daily. One each morning to treat edema 06/07/13  Yes [name], MD  glipiZIDE (GLUCOTROL XL) 10 MG 24 hr tablet Take 1 tablet (10 mg total) by mouth 2 (two) times daily. Take in the morning and evening to control blood sugar 12/06/12  Yes [name], MD  insulin NPH (HUMULIN N,NOVOLIN N) 100 UNIT/ML injection Inject 20-25 Units into the skin as directed. 20 units in the morning and 25 units in the evening to control diabetes. 12/06/12  Yes [name], MD  insulin regular (HUMULIN R) 100 units/mL injection Inject 2-5 Units into the skin 3 (three) times daily before meals. Per Sliding scale- as needed   Yes Historical Provider, MD  losartan-hydrochlorothiazide (HYZAAR) 50-12.5 MG per tablet TAKE 1 TABLET BY MOUTH EVERY DAY TO CONTROL BLOOD PRESSURE   Yes Tiffany L Reed, DO  PRILOSEC 40 MG capsule TAKE 1 CAPSULE TWICE DAILY FOR ACID REFLUX 05/09/14  Yes [name], MD  cetirizine (ZYRTEC) 10 MG tablet Take 10 mg by mouth daily. For allergies    Historical Provider, MD  citalopram (CELEXA) 40 MG tablet APPOINTMENT OVERDUE, 1 by mouth every day 05/24/14   [name], MD  fluticasone [facility]) 50 MCG/ACT nasal spray Place 1 spray into the nose daily as needed for allergies. In each nostril    Historical Provider, MD  Magnesium Oxide 250 MG TABS Take 250 mg by mouth daily.     Historical Provider, MD  nitroGLYCERIN (NITROSTAT) 0.4 MG SL tablet  Place 1 tablet (0.4 mg total) under the tongue every 5 (five) minutes as needed. For chest pain. Maximum of 3 tablets in 15 minutes. 12/06/12   [name], MD   BP 118/48 mmHg  Pulse 95  Temp(Src) 98.4 F (36.9 C) (Oral)  Resp 19  SpO2 95%   Physical Exam  General: Well-developed, well-nourished female in no acute distress; appearance consistent with age of record HENT: normocephalic; atraumatic; no dysphonia Eyes: pupils equal, round and reactive to light; extraocular muscles intact; lens implants Neck: supple Heart: regular rate and rhythm; tachycardia Lungs: Faint wheezes; stridor Abdomen: soft; nondistended; large non-tender left upper quadrant hernia; bowel sounds present Extremities: No deformity; full  range of motion; pulses normal; no edema Neurologic: Awake, alert and oriented; motor function intact in all extremities and symmetric; no facial droop Skin: Warm and dry Psychiatric: Normal mood and affect    ED Course  Procedures (including critical care time)   MDM  Nursing notes and vitals signs, including pulse oximetry, reviewed.  Summary of this visit's results, reviewed by myself:   EKG Interpretation  Date/Time:  Monday May 28 2014 02:27:35 EST Ventricular Rate:  100 PR Interval:  151 QRS Duration: 79 QT Interval:  359 QTC Calculation: 463 R Axis:   27 Text Interpretation:  Sinus tachycardia Low voltage, precordial leads Borderline T abnormalities, anterior leads Rate is faster Confirmed by Brookelle Pellicane  MD, Jenny Reichmann ([id number]) on 05/28/2014 2:32:49 AM      Labs:  Results for orders placed or performed during the hospital encounter of 05/28/14 (from the past 24 hour(s))  CBC with Differential     Status: Abnormal   Collection Time: 05/28/14  2:46 AM  Result Value Ref Range   WBC 5.8 4.0 - 10.5 K/uL   RBC 4.50 3.87 - 5.11 MIL/uL   Hemoglobin 11.5 (L) 12.0 - 15.0 g/dL   HCT 38.1 36.0 - 46.0 %   MCV 84.7 78.0 - 100.0 fL   MCH 25.6 (L) 26.0 - 34.0 pg   MCHC  30.2 30.0 - 36.0 g/dL   RDW 15.7 (H) 11.5 - 15.5 %   Platelets 276 150 - 400 K/uL   Neutrophils Relative % 69 43 - 77 %   Lymphocytes Relative 23 12 - 46 %   Monocytes Relative 7 3 - 12 %   Eosinophils Relative 1 0 - 5 %   Basophils Relative 0 0 - 1 %   Neutro Abs 4.0 1.7 - 7.7 K/uL   Lymphs Abs 1.3 0.7 - 4.0 K/uL   Monocytes Absolute 0.4 0.1 - 1.0 K/uL   Eosinophils Absolute 0.1 0.0 - 0.7 K/uL   Basophils Absolute 0.0 0.0 - 0.1 K/uL   Smear Review MORPHOLOGY UNREMARKABLE   Basic metabolic panel     Status: Abnormal   Collection Time: 05/28/14  2:46 AM  Result Value Ref Range   Sodium 136 135 - 145 mmol/L   Potassium 4.0 3.5 - 5.1 mmol/L   Chloride 104 96 - 112 mEq/L   CO2 25 19 - 32 mmol/L   Glucose, Bld 276 (H) 70 - 99 mg/dL   BUN 17 6 - 23 mg/dL   Creatinine, Ser 0.83 0.50 - 1.10 mg/dL   Calcium 8.5 8.4 - 10.5 mg/dL   GFR calc non Af Amer 67 (L) >90 mL/min   GFR calc Af Amer 78 (L) >90 mL/min   Anion gap 7 5 - 15  Brain natriuretic peptide     Status: None   Collection Time: 05/28/14  2:46 AM  Result Value Ref Range   B Natriuretic Peptide 19.4 0.0 - 100.0 pg/mL  Troponin I     Status: Abnormal   Collection Time: 05/28/14  2:46 AM  Result Value Ref Range   Troponin I 0.11 (H) <0.031 ng/mL    Imaging Studies: Dg Chest 2 View  05/28/2014   CLINICAL DATA:  Productive cough  EXAM: CHEST  2 VIEW  COMPARISON:  04/17/2013  FINDINGS: Best visualized in the lateral projection, there is a lower lobe opacity increasing the density of the lower lumbar spine. This is likely on the left. Normal heart size and mediastinal contours. No pleural effusion or pneumothorax.  IMPRESSION: Left lower lobe pneumonia. Recommend followup chest x-ray 6 weeks after treatment.   Electronically Signed   By: Jorje Guild M.D.   On: 05/28/2014 03:21   4:18 AM Stridor unrelieved by racemic Neb. Patient coughing but in no acute respiratory distress. We'll start antibiotics for pneumonia.     Wynetta Fines, MD 05/28/14 [phone]

## 2014-05-28 NOTE — H&P (Signed)
[facility] History and Physical  [name]:[id number] DOB: 04/20/1941 DOA: 05/28/2014  Referring physician: EDP PCP: [name], MD   Chief Complaint: SOB   HPI: [name] is a 73 y.o. female who presents to the ED with SOB.  She has had cough, productive of purulent sputum, and worsening SOB.  Symptoms onset Wed of this week, have been worsening since onset.  Tonight became very SOB and presented to ED.  O2 sat was 89% with diffuse wheezing on arrival, improved to 95% after a neb treatment.  Work up demonstrates LLL retrocardiac infiltrate.  Being admitted for treatment of CAP.  Review of Systems: Systems reviewed.  As above, otherwise negative  Past Medical History  Diagnosis Date  . Hypertension   . Diabetes mellitus   . High cholesterol   . Other specified disease of sebaceous glands   . Other B-complex deficiencies   . Depressive disorder, not elsewhere classified   . Tension headache   . Migraine without aura, with intractable migraine, so stated, with status migrainosus   . Type I (juvenile type) diabetes mellitus without mention of complication, uncontrolled   . Unspecified vitamin D deficiency   . Dyskinesia of esophagus   . Memory loss   . Mild cognitive impairment, so stated   . Other nonspecific abnormal serum enzyme levels   . Lipoma of other skin and subcutaneous tissue   . Pain in joint, shoulder region   . Nonspecific (abnormal) findings on radiological and other examination of abdominal area, including retroperitoneum   . Nonspecific abnormal results of liver function study   . Pain in joint, pelvic region and thigh   . Pain in joint, ankle and foot   . Edema   . Lumbago   . Other symptoms involving cardiovascular system   . Abdominal pain, unspecified site   . Other specified cardiac dysrhythmias(427.89)   . Ventral hernia, unspecified, without mention of obstruction or gangrene   . Extrinsic asthma, unspecified   . Dizziness and  giddiness   . Palpitations   . Shortness of breath   . Reflux esophagitis   . Obesity, unspecified   . Unspecified hypothyroidism   . Type I (juvenile type) diabetes mellitus without mention of complication, not stated as uncontrolled   . Other and unspecified hyperlipidemia   . Gout, unspecified   . Unspecified essential hypertension   . Encounter for long-term (current) use of other medications   . Abnormality of gait   . Chest pain, unspecified   . Obstructive chronic bronchitis with exacerbation   . Female stress incontinence   . Apnea   . Cardiomegaly   . Complication of anesthesia     hard time waking up   Past Surgical History  Procedure Laterality Date  . Stents      last time she said was 1 yr ago   . Breast surgery    . Appendectomy    . Abdominal hysterectomy    . Gastric bypass  2005  approx   Social History:  reports that she has never smoked. She has never used smokeless tobacco. She reports that she does not drink alcohol or use illicit drugs.  Allergies  Allergen Reactions  . Ciprofloxacin Rash  . Advair Diskus [Fluticasone-Salmeterol] Other (See Comments)    Throat closes  . Albuterol     REACTION: closes throat  . Alendronate Sodium Itching  . Benadryl [Diphenhydramine Hcl]   . Benzonatate     REACTION:  rash/hives  . Cephalexin Nausea Only  . Gabapentin Other (See Comments)    Loss of memory  . Iohexol      Desc: rash and DIF BREATHING   . Keflex [Cephalexin]   . Morphine Nausea And Vomiting  . Other     Decongestants  . Propoxyphene Hcl   . Reclast [Zoledronic Acid] Other (See Comments)    Chest pain  . Avapro [Irbesartan] Rash  . Codeine Nausea And Vomiting, Swelling and Rash  . Tessalon Perles Rash  . Tramadol Nausea Only and Rash    Family History  Problem Relation Age of Onset  . Stroke Mother   . Stroke Father   . Diabetes Father   . Heart disease Father   . Diabetes Son   . Cancer Brother     BLADDER  . Diabetes Brother       Prior to Admission medications   Medication Sig Start Date End Date Taking? Authorizing Provider  aspirin 81 MG tablet Take 81 mg by mouth daily.    Yes Historical Provider, MD  Cholecalciferol (VITAMIN D3) 5000 UNITS CAPS Take 1 tablet by mouth daily. For supplement   Yes Historical Provider, MD  citalopram (CELEXA) 40 MG tablet Take 1 tablet (40 mg total) by mouth daily. 12/06/12  Yes [name], MD  furosemide (LASIX) 40 MG tablet One each morning to treat edema Patient taking differently: Take 40 mg by mouth daily. One each morning to treat edema 06/07/13  Yes [name], MD  glipiZIDE (GLUCOTROL XL) 10 MG 24 hr tablet Take 1 tablet (10 mg total) by mouth 2 (two) times daily. Take in the morning and evening to control blood sugar 12/06/12  Yes [name], MD  insulin NPH (HUMULIN N,NOVOLIN N) 100 UNIT/ML injection Inject 20-25 Units into the skin as directed. 20 units in the morning and 25 units in the evening to control diabetes. 12/06/12  Yes [name], MD  insulin regular (HUMULIN R) 100 units/mL injection Inject 2-5 Units into the skin 3 (three) times daily before meals. Per Sliding scale- as needed   Yes Historical Provider, MD  losartan-hydrochlorothiazide (HYZAAR) 50-12.5 MG per tablet TAKE 1 TABLET BY MOUTH EVERY DAY TO CONTROL BLOOD PRESSURE   Yes Tiffany L Reed, DO  PRILOSEC 40 MG capsule TAKE 1 CAPSULE TWICE DAILY FOR ACID REFLUX 05/09/14  Yes [name], MD  cetirizine (ZYRTEC) 10 MG tablet Take 10 mg by mouth daily. For allergies    Historical Provider, MD  fluticasone (FLONASE) 50 MCG/ACT nasal spray Place 1 spray into the nose daily as needed for allergies. In each nostril    Historical Provider, MD  Magnesium Oxide 250 MG TABS Take 250 mg by mouth daily.     Historical Provider, MD  nitroGLYCERIN (NITROSTAT) 0.4 MG SL tablet Place 1 tablet (0.4 mg total) under the tongue every 5 (five) minutes as needed. For chest pain. Maximum of 3 tablets in 15 minutes. 12/06/12    [name], MD   Physical Exam: Filed Vitals:   05/28/14 0500  BP: 117/81  Pulse:   Temp:   Resp:     BP 117/81 mmHg  Pulse 95  Temp(Src) 98.4 F (36.9 C) (Oral)  Resp 19  SpO2 95%  General Appearance:    Alert, oriented, no distress, appears stated age  Head:    Normocephalic, atraumatic  Eyes:    PERRL, EOMI, sclera non-icteric        Nose:  Nares without drainage or epistaxis. Mucosa, turbinates normal  Throat:   Moist mucous membranes. Oropharynx without erythema or exudate.  Neck:   Supple. No carotid bruits.  No thyromegaly.  No lymphadenopathy.   Back:     No CVA tenderness, no spinal tenderness  Lungs:     Clear to auscultation bilaterally, without wheezes, rhonchi or rales  Chest wall:    No tenderness to palpitation  Heart:    Regular rate and rhythm without murmurs, gallops, rubs  Abdomen:     Soft, non-tender, nondistended, normal bowel sounds, no organomegaly  Genitalia:    deferred  Rectal:    deferred  Extremities:   No clubbing, cyanosis or edema.  Pulses:   2+ and symmetric all extremities  Skin:   Skin color, texture, turgor normal, no rashes or lesions  Lymph nodes:   Cervical, supraclavicular, and axillary nodes normal  Neurologic:   CNII-XII intact. Normal strength, sensation and reflexes      throughout    Labs on Admission:  Basic Metabolic Panel:  Recent Labs Lab 05/28/14 0246  NA 136  K 4.0  CL 104  CO2 25  GLUCOSE 276*  BUN 17  CREATININE 0.83  CALCIUM 8.5   Liver Function Tests: No results for input(s): AST, ALT, ALKPHOS, BILITOT, PROT, ALBUMIN in the last 168 hours. No results for input(s): LIPASE, AMYLASE in the last 168 hours. No results for input(s): AMMONIA in the last 168 hours. CBC:  Recent Labs Lab 05/28/14 0246  WBC 5.8  NEUTROABS 4.0  HGB 11.5*  HCT 38.1  MCV 84.7  PLT 276   Cardiac Enzymes:  Recent Labs Lab 05/28/14 0246  TROPONINI 0.11*    BNP (last 3 results) No results for input(s): PROBNP  in the last 8760 hours. CBG: No results for input(s): GLUCAP in the last 168 hours.  Radiological Exams on Admission: Dg Chest 2 View  05/28/2014   CLINICAL DATA:  Productive cough  EXAM: CHEST  2 VIEW  COMPARISON:  04/17/2013  FINDINGS: Best visualized in the lateral projection, there is a lower lobe opacity increasing the density of the lower lumbar spine. This is likely on the left. Normal heart size and mediastinal contours. No pleural effusion or pneumothorax.  IMPRESSION: Left lower lobe pneumonia. Recommend followup chest x-ray 6 weeks after treatment.   Electronically Signed   By: Jorje Guild M.D.   On: 05/28/2014 03:21    EKG: Independently reviewed.  Assessment/Plan Principal Problem:   CAP (community acquired pneumonia) Active Problems:   DM2 (diabetes mellitus, type 2)   1. CAP - 1. Rocephin azithromycin 2. zofran PRN nausea (has developed this to ABx in the past) 3. Cultures pending 4. Adult wheeze protocol 5. Continuous pulse ox 2. DM2 - 1. NPH 20 units BID (takes 25 qam and 20 qpm at home) 2. Low dose SSI 3. 2 units mealtime coverage    Code Status: Full Code  Family Communication: Husband at bedside Disposition Plan: Admit to inpatient   Time spent: 70 min  Toula Miyasaki M. [facility] Pager [phone]  If 7AM-7PM, please contact the day team taking care of the patient [email or web address] Password [email or web address] 05/28/2014, 5:50 AM

## 2014-05-28 NOTE — Progress Notes (Signed)
[facility] PROGRESS NOTE  [name] VHQ:[id number] DOB: [birth year] DOA: 05/28/2014 PCP: [name], MD  Assessment/Plan: 1. CAP 1. Pt is continued on rocephin and azithromycin 2. This AM, pt notes feeling better 3. No leukocytosis, afebrile 4. CXR findings with evidence suggestive of cap 5. Admit to med-tele 2. DM2 1. Cont on home meds with SSI coverage 3. hld 1. Not on statin 2. Will check AM lipids 4. HTN 1. BP stable  2. Cont losartan/hctz as tolerated 5. Asthma exacerbation 1. Improved with breathing tx 2. Persistent wheezing on exam noted this AM 3. Continue bronchodilators 6. DVT prophylaxis 1. Heparin subq 7. Elevated troponin 1. Pt is asymptomatic and denies chest pain or nausea 2. EKG is unremarkable 3. Suspect possible demand-mismatch related to asthma exacerbation 4. Will cycle serial trop and if there is a trend upwards or if patient becomes symptomatic, would then consider formal Cardiology consult  Code Status: Full Family Communication: Pt in room  Disposition Plan: Pending   Consultants:    Procedures:    Antibiotics:  Rocephin 12/28>>>  Azithromycin 12/28>>>   HPI/Subjective: Pt feels better today. No acute events noted  Objective: Filed Vitals:   05/28/14 0915 05/28/14 1115 05/28/14 1148 05/28/14 1250  BP:  133/45 166/50 146/65  Pulse: 82 66 65 62  Temp:   98.9 F (37.2 C) 98.9 F (37.2 C)  TempSrc:   Oral Oral  Resp: 25 20 20 20   Weight:   83.008 kg (183 lb)   SpO2: 100% 95% 98% 97%   No intake or output data in the 24 hours ending 05/28/14 1500 Filed Weights   05/28/14 1148  Weight: 83.008 kg (183 lb)    Exam:   General:  Awake, in nad  Cardiovascular: regular, s1, s2  Respiratory: normal resp effort, mild end-expiratory wheezing B  Abdomen: soft, obese, nondistended  Musculoskeletal: perfused, no clubbing   Data Reviewed: Basic Metabolic Panel:  Recent Labs Lab 05/28/14 0246  NA 136  K 4.0   CL 104  CO2 25  GLUCOSE 276*  BUN 17  CREATININE 0.83  CALCIUM 8.5   Liver Function Tests: No results for input(s): AST, ALT, ALKPHOS, BILITOT, PROT, ALBUMIN in the last 168 hours. No results for input(s): LIPASE, AMYLASE in the last 168 hours. No results for input(s): AMMONIA in the last 168 hours. CBC:  Recent Labs Lab 05/28/14 0246  WBC 5.8  NEUTROABS 4.0  HGB 11.5*  HCT 38.1  MCV 84.7  PLT 276   Cardiac Enzymes:  Recent Labs Lab 05/28/14 0246  TROPONINI 0.11*   BNP (last 3 results) No results for input(s): PROBNP in the last 8760 hours. CBG:  Recent Labs Lab 05/28/14 0812  GLUCAP 244*    No results found for this or any previous visit (from the past 240 hour(s)).   Studies: Dg Chest 2 View  05/28/2014   CLINICAL DATA:  Productive cough  EXAM: CHEST  2 VIEW  COMPARISON:  04/17/2013  FINDINGS: Best visualized in the lateral projection, there is a lower lobe opacity increasing the density of the lower lumbar spine. This is likely on the left. Normal heart size and mediastinal contours. No pleural effusion or pneumothorax.  IMPRESSION: Left lower lobe pneumonia. Recommend followup chest x-ray 6 weeks after treatment.   Electronically Signed   By: Jorje Guild M.D.   On: 05/28/2014 03:21    Scheduled Meds: . aspirin  81 mg Oral Daily  . [START ON 05/29/2014] azithromycin  500  mg Intravenous Q24H  . [START ON 05/29/2014] cefTRIAXone (ROCEPHIN)  IV  1 g Intravenous Q24H  . cholecalciferol  5,000 Units Oral Daily  . citalopram  40 mg Oral Daily  . furosemide  40 mg Oral Daily  . glipiZIDE  10 mg Oral BID WC  . heparin  5,000 Units Subcutaneous 3 times per day  . losartan  50 mg Oral Daily   And  . hydrochlorothiazide  12.5 mg Oral Daily  . insulin aspart  0-9 Units Subcutaneous TID WC  . insulin aspart  2 Units Subcutaneous TID WC  . insulin NPH Human  20 Units Subcutaneous BID AC & HS  . loratadine  10 mg Oral Daily  . magnesium oxide  200 mg Oral  Daily  . pantoprazole  80 mg Oral Daily   Continuous Infusions:   Principal Problem:   CAP (community acquired pneumonia) Active Problems:   DM2 (diabetes mellitus, type 2)  Time spent: 56min  Elnora Quizon, [place] [facility] Pager [phone]. If 7PM-7AM, please contact night-coverage at [email or web address], password [email or web address] 05/28/2014, 3:00 PM  LOS: 0 days

## 2014-05-28 NOTE — ED Notes (Signed)
Awake. Verbally responsive. A/O x4. Resp even and unlabored. Occ productive cough noted. ABC's intact. SR on monitor at 85bpm. IV infusing ABT without difficulty. No adverse reaction noted.

## 2014-05-28 NOTE — Progress Notes (Addendum)
Pt assessed per wheeze protocol.  HR 89, rr18, BBS clear, diminished in bases.  HHN not indicated at this time.

## 2014-05-28 NOTE — ED Notes (Signed)
2L [place] reapplied

## 2014-05-28 NOTE — ED Notes (Signed)
Bed: WA04 Expected date:  Expected time:  Means of arrival:  Comments: 

## 2014-05-28 NOTE — Progress Notes (Signed)
PHARMACY BRIEF NOTE:  CITALOPRAM DOSAGE  On citalopram 40mg  daily as taken PTA.   Current FDA labeling recommends limiting Celexa dosage to 20mg  daily when age > 60 due to increased risk of QTc prolongation and life-threatening arrhythmias.    QTc 463 msec on admission EKG  Recommend: Consider risk vs benefit of continuing present citalopram dosage.  Clayburn Pert, PharmD, BCPS Pager: [phone] 05/28/2014  12:17 PM

## 2014-05-28 NOTE — Progress Notes (Signed)
UR completed 

## 2014-05-28 NOTE — ED Notes (Signed)
Resting quietly with eye closed. Easily arousable. Verbally responsive. Resp even and unlabored. ABC's intact. SR on monitor.

## 2014-05-28 NOTE — ED Notes (Signed)
Pt ambulated to BR and back to room with sats at 96-98% RA. Admitting MD at bedside.

## 2014-05-28 NOTE — ED Notes (Signed)
Gave patient breakfast tray

## 2014-05-29 LAB — LIPID PANEL
CHOL/HDL RATIO: 5.2 ratio
CHOLESTEROL: 155 mg/dL (ref 0–200)
HDL: 30 mg/dL — ABNORMAL LOW (ref >39)
LDL Cholesterol: 86 mg/dL (ref 0–99)
Triglycerides: 193 mg/dL — ABNORMAL HIGH (ref <150)
VLDL: 39 mg/dL (ref 0–40)

## 2014-05-29 LAB — GLUCOSE, CAPILLARY
GLUCOSE-CAPILLARY: 280 mg/dL — AB (ref 70–99)
GLUCOSE-CAPILLARY: 456 mg/dL — AB (ref 70–99)
GLUCOSE-CAPILLARY: 475 mg/dL — AB (ref 70–99)
Glucose-Capillary: 123 mg/dL — ABNORMAL HIGH (ref 70–99)
Glucose-Capillary: 86 mg/dL (ref 70–99)

## 2014-05-29 LAB — EXPECTORATED SPUTUM ASSESSMENT W REFEX TO RESP CULTURE

## 2014-05-29 LAB — TROPONIN I
Troponin I: 0.06 ng/mL — ABNORMAL HIGH (ref <0.031)
Troponin I: 0.14 ng/mL — ABNORMAL HIGH (ref <0.031)
Troponin I: 0.07 ng/mL — ABNORMAL HIGH (ref <0.031)
Troponin I: 0.04 ng/mL — ABNORMAL HIGH (ref <0.031)

## 2014-05-29 LAB — EXPECTORATED SPUTUM ASSESSMENT W GRAM STAIN, RFLX TO RESP C

## 2014-05-29 MED ORDER — GUAIFENESIN ER 600 MG PO TB12
600.0000 mg | ORAL_TABLET | Freq: Two times a day (BID) | ORAL | Status: DC
  Administered 2014-05-29 – 2014-06-03 (×12): 600 mg via ORAL
  Filled 2014-05-29 (×13): qty 1

## 2014-05-29 MED ORDER — IPRATROPIUM-ALBUTEROL 0.5-2.5 (3) MG/3ML IN SOLN
3.0000 mL | RESPIRATORY_TRACT | Status: DC
  Administered 2014-05-29: 3 mL via RESPIRATORY_TRACT

## 2014-05-29 MED ORDER — METHYLPREDNISOLONE SODIUM SUCC 125 MG IJ SOLR
60.0000 mg | Freq: Four times a day (QID) | INTRAMUSCULAR | Status: DC
  Administered 2014-05-29 – 2014-05-30 (×4): 60 mg via INTRAVENOUS
  Filled 2014-05-29 (×8): qty 0.96

## 2014-05-29 MED ORDER — RACEPINEPHRINE HCL 2.25 % IN NEBU
0.5000 mL | INHALATION_SOLUTION | RESPIRATORY_TRACT | Status: DC
  Administered 2014-05-29 – 2014-05-30 (×5): 0.5 mL via RESPIRATORY_TRACT
  Filled 2014-05-29 (×12): qty 0.5

## 2014-05-29 MED ORDER — RACEPINEPHRINE HCL 2.25 % IN NEBU
0.5000 mL | INHALATION_SOLUTION | RESPIRATORY_TRACT | Status: DC | PRN
  Filled 2014-05-29: qty 0.5

## 2014-05-29 MED ORDER — FAMOTIDINE IN NACL 20-0.9 MG/50ML-% IV SOLN
20.0000 mg | Freq: Two times a day (BID) | INTRAVENOUS | Status: DC
  Administered 2014-05-29 – 2014-06-01 (×8): 20 mg via INTRAVENOUS
  Filled 2014-05-29 (×9): qty 50

## 2014-05-29 MED ORDER — IPRATROPIUM-ALBUTEROL 0.5-2.5 (3) MG/3ML IN SOLN
3.0000 mL | RESPIRATORY_TRACT | Status: DC | PRN
  Filled 2014-05-29: qty 3

## 2014-05-29 MED ORDER — LIP MEDEX EX OINT
TOPICAL_OINTMENT | CUTANEOUS | Status: AC
  Administered 2014-05-29: 1
  Filled 2014-05-29: qty 7

## 2014-05-29 NOTE — Progress Notes (Signed)
Pt's CBG taken at 2143 was 456. NP Forrest Moron text paged and then called as well for the critical value. Did not call back but wrote an order to check pt's CBG at 0200 and call MD if CBG >350. No further order given.

## 2014-05-29 NOTE — Progress Notes (Signed)
Inpatient Diabetes Program Recommendations  AACE/ADA: New Consensus Statement on Inpatient Glycemic Control (2013)  Target Ranges:  Prepandial:   less than 140 mg/dL      Peak postprandial:   less than 180 mg/dL (1-2 hours)      Critically ill patients:  140 - 180 mg/dL     Results for [name], [name] (MRN [id number]) as of 05/29/2014 09:51  Ref. Range 05/28/2014 08:12 05/28/2014 12:21 05/28/2014 17:43 05/28/2014 18:51 05/28/2014 19:40 05/28/2014 22:28  Glucose-Capillary Latest Range: 70-99 mg/dL 244 (H) 201 (H) 55 (L) 147 (H) 181 (H) 169 (H)     Current Insulin Orders: NPH insulin 20 units bid      Novolog Sensitive SSI      Novolog 2 units tidwc      Glipizide 10 mg bid   **Patient with Hypoglycemia last night at supper time.   MD- If patient continues to have hypoglycemia, may want to consider placing Glipizide on hold for now until patient ready for d/c     Will follow [name] RN, MSN, CDE Diabetes Coordinator Inpatient Diabetes Program Team Pager: [phone] (8a-10p)

## 2014-05-29 NOTE — Progress Notes (Signed)
CRITICAL VALUE ALERT  Critical value received:  CBG 456  Date of notification:  05/29/14  Time of notification:  2143  Critical value read back:yes  Nurse who received alert:  Tor Netters, RN  MD notified (1st page):  Tylene Fantasia, NP  Time of first page:  2150  MD notified (2nd page):  Time of second page:  Responding MD:  Tylene Fantasia, NP  Time MD responded:  [phone]

## 2014-05-29 NOTE — Progress Notes (Addendum)
Rt went to give douneb per MD order. Pt stated she felt like her throat was closing up during neb. MD called he gave order to give Racepinehrine 2.25%. Pt states she is much better, vital stable, equal BS with some [place], pt able to talk.

## 2014-05-29 NOTE — Progress Notes (Deleted)
Adult ICU Glycemic Control Treatment Guidelines Sidebar Report  ICU Glycemic Control Protocol Subcutaneous insulin (Phase 1)  1. Monitor CBG every 4 hours 2. Administer correction coverage with insulin aspart (NOVOLOG) subcutaneously every 4 hours according to the sliding scale 3. If CBG < 70 mg/dL use Hypoglycemia Order Set a. In Order Management under Order Sets search for Hypoglycemia Order Set b. Place orders per protocol, cosign required 4. If there is one CBG > 250 mg/dL or two subsequent CBG > 200 mg/dL a. Search for ICU Glycemic Control Order Set (Phase 2) in Order Management under Order Sets and place orders per protocol, cosign required. b. In Order Management select Active Orders and View by Order Set. Discontinue ALL orders from ICU Glycemic Control Order Set (Phase 1) per protocol, cosign required.  ICU Glycemic Control Protocol IV insulin (Phase 2)  1. Monitor CBG every 1 hour 2. Administer regular insulin intravenously per GlucoStabilizer (MDN-CGS) software.   Set high target = 180, low target = 140, multiplier = 0.01   Do NOT use glucose lab values (use CBG readings only)   If CBG reads "Critical High", enter 601   If on TF / TNA and it is discontinued, enter original multiplier 3. If CBG < 70    Follow GlucoStabilizer Instructions   Recheck POCT CBG in 15 minutes   Change multiplier to 0.01 4. Notify MD if   Prompted by GlucoStabilizer:     If insulin drip rate is greater than 30 units/hour, stop insulin drip for 30 minutes and restart GS with original multiplier 0.01   CBG<70 after treating hypoglycemia twice   CBG<250 and there is no IV dextrose, TNA or TF   Withholding insulin for more then 1 hour  5. Initiate Phase III (Transition ICU Glycemic Control Order Set) when all of the following are met:    the insulin infusion rate is < 4 units / hour    tube feeds are at goal rate and stable     there are 6 subsequent CBG readings < 180 mg/dL 6. If patient is on TNA,  await further steps until TNA pharmacist sees patient.    In Order Management under Order Sets search for ICU Glycemic Control Order Set (Phase 3).  Initiate all orders from ICU Glycemic Control Order Set (Phase 2) per protocol, cosign required.   In Order Management select Active Orders and View by Order Set. Discontinue ALL orders form ICU Glycemic Control Order Set (Phase II) per protocol, cosign required.  ICU Glycemic Control Protocol Transition (Phase 3) **Choose appropriate Lantus and tube feed coverage based on last insulin drip rate.  Choose correction Novolog based on patients most recent glomerular filtration rate (GFR).  If GFR less than 30 choose sensitive, and if GFR greater than 30, choose standard. Please verify insulin drip rate/transition doses with 2nd RN.  1. Give first dose of Lantus now and q 24 hours per last insulin drip rate, and discontinue insulin drip 2 hours after Lantus given. 2. Monitor CBG every 4 hours 3. Administer correction insulin (NOVOLOG) [place] according to sliding scale 4. Administer tube feeding coverage (NOVOLOG) as ordered [place] every 4 hours after tubes feeds are at goal. If patient is not receiving TF, it is not at goal, or coverage is not ordered - skip this step.   5. If tube feeding / TNA is stopped, start D10 infusion at 40 mL/h and hold tube feeding coverage.    6. If there is one CBG reading >   250 mg/dL or two subsequent CBG readings > 200 mg/dL switch to ICU Glycemic Control Order Set (Phase II) a. In Order Management select Active Orders and View by Order Set. Discontinue ALL orders form ICU Glycemic Control Order Set (Phase III) per protocol, cosign required. 7. In Order Management under Order Sets search for ICU Glycemic Control Order Set (Phase II).  Initiate ALL orders form ICU Glycemic Control Order Set (Phase II) per protocol, cosign required. 8.  

## 2014-05-29 NOTE — Progress Notes (Signed)
Pt's CBG was re-taken at 2235 and went up to 475. Novolin 20 units given at 2200 as part of her scheduled medication. NP Forrest Moron text paged and notified of new critical result. Also noted on the text that RN could not figure out how to put an order for Stat Lab Verification (inspite of the fact that 4 of [place] RNs on duty have tried figuring it out).  NP returned call and stated two things: first, for future reference she said, that if is she did not call back or write an order, it means that there is nothing to do for the patient; and secondly, the patient does not need a Stat Lab order. No further order given and she hanged up the phone.

## 2014-05-29 NOTE — Care Management Note (Signed)
    Page 1 of 1   05/29/2014     11:11:19 AM CARE MANAGEMENT NOTE 05/29/2014  Patient:  [name], [name]   Account Number:  [id number]  Date Initiated:  05/29/2014  Documentation initiated by:  Sunday Spillers  Subjective/Objective Assessment:   73 yo female admitted with PNA. PTA lived at home with spouse.     Action/Plan:   Home when stable   Anticipated DC Date:  06/01/2014   Anticipated DC Plan:  [facility] [place]  CM consult      Choice offered to / List presented to:             Status of service:  Completed, signed off Medicare Important Message given?   (If response is "NO", the following Medicare IM given date fields will be blank) Date Medicare IM given:   Medicare IM given by:   Date Additional Medicare IM given:   Additional Medicare IM given by:    Discharge Disposition:  HOME/SELF CARE  Per UR Regulation:  Reviewed for med. necessity/level of care/duration of stay  If discussed at [place] of Stay Meetings, dates discussed:    Comments:

## 2014-05-29 NOTE — Progress Notes (Signed)
CRITICAL VALUE ALERT  Critical value received:  CBG 475  Date of notification:  05/29/14  Time of notification: 2235  Critical value read back:yes  Nurse who received alert:  Tor Netters, RN  MD notified (1st page):  Tylene Fantasia, NP  Time of first page:  2235  MD notified (2nd page):  Time of second page:  Responding MD:  Tylene Fantasia, NP  Time MD responded:  [phone]

## 2014-05-29 NOTE — Progress Notes (Signed)
Patient with complaints of feeling that throat is closing up during the breathing treatment, MD [facility] notified awaiting callback Neta Mends RN 05-29-2014 12:46pm

## 2014-05-29 NOTE — Progress Notes (Addendum)
[facility] PROGRESS NOTE  [name] ENM:[id number] DOB: Oct 29, 1940 DOA: 05/28/2014 PCP: [name], MD  Assessment/Plan: 1. CAP 1. Pt is continued on rocephin and azithromycin 2. Continued cough and sputum production this AM 3. No leukocytosis, afebrile 4. CXR findings with evidence suggestive of cap 2. DM2 1. Cont on home meds with SSI coverage 3. hld 1. Not on statin 2. Will check AM lipids 4. HTN 1. BP stable  2. Cont losartan/hctz as tolerated 5. Asthma exacerbation 1. Wheezing on exam throughout 2. Cont on scheduled steroids 3. Pt has a documented allergy to albuterol 6. DVT prophylaxis 1. Heparin subq 7. Elevated troponin 1. Pt is asymptomatic and denies chest pain or nausea 2. Trop remaining mildly elevated 3. Repeat EKG is unremarkable 4. Suspect possible demand-mismatch related to asthma exacerbation since pt continues to have wheezing episodes this AM 5. Will order 2d echo to r/o WMA 6. Will repeat serial enzymes, if pt becomes symptomatic or if trop trends up, would then formally consult Cardiology  Code Status: Full Family Communication: Pt in room  Disposition Plan: Pending  Consultants:    Procedures:    Antibiotics:  Rocephin 12/28>>>  Azithromycin 12/28>>>   HPI/Subjective: Complains of increased wheezing this AM.  Objective: Filed Vitals:   05/29/14 0633 05/29/14 1257 05/29/14 1400 05/29/14 1521  BP: 129/58  164/50   Pulse: 47  58   Temp: 98.3 F (36.8 C)  98.6 F (37 C)   TempSrc: Oral  Oral   Resp: 20  20   Height:      Weight:      SpO2: 96% 98% 98% 98%    Intake/Output Summary (Last 24 hours) at 05/29/14 1723 Last data filed at 05/29/14 1452  Gross per 24 hour  Intake    840 ml  Output    950 ml  Net   -110 ml   Filed Weights   05/28/14 1148  Weight: 83.008 kg (183 lb)    Exam:   General:  Awake, in nad  Cardiovascular: regular, s1, s2  Respiratory: normal resp effort, mild end-expiratory  wheezing B  Abdomen: soft, obese, nondistended  Musculoskeletal: perfused, no clubbing   Data Reviewed: Basic Metabolic Panel:  Recent Labs Lab 05/28/14 0246  NA 136  K 4.0  CL 104  CO2 25  GLUCOSE 276*  BUN 17  CREATININE 0.83  CALCIUM 8.5   Liver Function Tests: No results for input(s): AST, ALT, ALKPHOS, BILITOT, PROT, ALBUMIN in the last 168 hours. No results for input(s): LIPASE, AMYLASE in the last 168 hours. No results for input(s): AMMONIA in the last 168 hours. CBC:  Recent Labs Lab 05/28/14 0246  WBC 5.8  NEUTROABS 4.0  HGB 11.5*  HCT 38.1  MCV 84.7  PLT 276   Cardiac Enzymes:  Recent Labs Lab 05/28/14 0246 05/28/14 1900 05/29/14 0140 05/29/14 0635 05/29/14 1340  TROPONINI 0.11* 0.07* 0.06* 0.14* 0.07*   BNP (last 3 results) No results for input(s): PROBNP in the last 8760 hours. CBG:  Recent Labs Lab 05/28/14 1940 05/28/14 2228 05/29/14 0751 05/29/14 1236 05/29/14 1659  GLUCAP 181* 169* 123* 86 280*    Recent Results (from the past 240 hour(s))  Culture, blood (routine x 2) Call MD if unable to obtain prior to antibiotics being given     Status: None (Preliminary result)   Collection Time: 05/28/14  6:22 AM  Result Value Ref Range Status   Specimen Description BLOOD RIGHT FOREARM  Final  Special Requests BOTTLES DRAWN AEROBIC AND ANAEROBIC 5CC  Final   Culture   Final           BLOOD CULTURE RECEIVED NO GROWTH TO DATE CULTURE WILL BE HELD FOR 5 DAYS BEFORE ISSUING A FINAL NEGATIVE REPORT Performed at [facility]    Report Status PENDING  Incomplete  Culture, blood (routine x 2) Call MD if unable to obtain prior to antibiotics being given     Status: None (Preliminary result)   Collection Time: 05/28/14  6:22 AM  Result Value Ref Range Status   Specimen Description BLOOD RIGHT ANTECUBITAL  Final   Special Requests BOTTLES DRAWN AEROBIC AND ANAEROBIC 5CC  Final   Culture   Final           BLOOD CULTURE RECEIVED NO  GROWTH TO DATE CULTURE WILL BE HELD FOR 5 DAYS BEFORE ISSUING A FINAL NEGATIVE REPORT Performed at [facility]    Report Status PENDING  Incomplete  Culture, sputum-assessment     Status: None   Collection Time: 05/28/14 11:13 PM  Result Value Ref Range Status   Specimen Description SPUTUM  Final   Special Requests NONE  Final   Sputum evaluation   Final    THIS SPECIMEN IS ACCEPTABLE. RESPIRATORY CULTURE REPORT TO FOLLOW.   Report Status 05/29/2014 FINAL  Final  Culture, respiratory (NON-Expectorated)     Status: None (Preliminary result)   Collection Time: 05/28/14 11:13 PM  Result Value Ref Range Status   Specimen Description SPUTUM  Final   Special Requests NONE  Final   Gram Stain   Final    FEW WBC PRESENT,BOTH PMN AND MONONUCLEAR RARE SQUAMOUS EPITHELIAL CELLS PRESENT NO ORGANISMS SEEN Performed at [facility]    Culture NO GROWTH Performed at [facility]   Final   Report Status PENDING  Incomplete     Studies: Dg Chest 2 View  05/28/2014   CLINICAL DATA:  Productive cough  EXAM: CHEST  2 VIEW  COMPARISON:  04/17/2013  FINDINGS: Best visualized in the lateral projection, there is a lower lobe opacity increasing the density of the lower lumbar spine. This is likely on the left. Normal heart size and mediastinal contours. No pleural effusion or pneumothorax.  IMPRESSION: Left lower lobe pneumonia. Recommend followup chest x-ray 6 weeks after treatment.   Electronically Signed   By: Jorje Guild M.D.   On: 05/28/2014 03:21    Scheduled Meds: . aspirin  81 mg Oral Daily  . azithromycin  500 mg Intravenous Q24H  . cefTRIAXone (ROCEPHIN)  IV  1 g Intravenous Q24H  . cholecalciferol  5,000 Units Oral Daily  . citalopram  40 mg Oral Daily  . famotidine (PEPCID) IV  20 mg Intravenous Q12H  . furosemide  40 mg Oral Daily  . glipiZIDE  10 mg Oral BID WC  . guaiFENesin  600 mg Oral BID  . heparin  5,000 Units Subcutaneous 3 times per day  .  losartan  50 mg Oral Daily   And  . hydrochlorothiazide  12.5 mg Oral Daily  . insulin aspart  0-9 Units Subcutaneous TID WC  . insulin aspart  2 Units Subcutaneous TID WC  . insulin NPH Human  20 Units Subcutaneous BID AC & HS  . loratadine  10 mg Oral Daily  . magnesium oxide  200 mg Oral Daily  . methylPREDNISolone (SOLU-MEDROL) injection  60 mg Intravenous Q6H  . pantoprazole  80 mg Oral Daily  .  Racepinephrine HCl  0.5 mL Nebulization Q4H   Continuous Infusions:   Principal Problem:   CAP (community acquired pneumonia) Active Problems:   DM2 (diabetes mellitus, type 2)   Cough   Stridor   Elevated troponin  Time spent: 73min  Laasia Arcos, [facility] Pager [phone]. If 7PM-7AM, please contact night-coverage at [email or web address], password [facility] 05/29/2014, 5:23 PM  LOS: 1 day

## 2014-05-30 DIAGNOSIS — R062 Wheezing: Secondary | ICD-10-CM

## 2014-05-30 LAB — GLUCOSE, CAPILLARY
Glucose-Capillary: 414 mg/dL — ABNORMAL HIGH (ref 70–99)
Glucose-Capillary: 340 mg/dL — ABNORMAL HIGH (ref 70–99)
Glucose-Capillary: 373 mg/dL — ABNORMAL HIGH (ref 70–99)
Glucose-Capillary: 159 mg/dL — ABNORMAL HIGH (ref 70–99)
Glucose-Capillary: 209 mg/dL — ABNORMAL HIGH (ref 70–99)

## 2014-05-30 LAB — COMPREHENSIVE METABOLIC PANEL
ALT: 18 U/L (ref 0–35)
ANION GAP: 7 (ref 5–15)
AST: 23 U/L (ref 0–37)
Albumin: 3.4 g/dL — ABNORMAL LOW (ref 3.5–5.2)
Alkaline Phosphatase: 83 U/L (ref 39–117)
BILIRUBIN TOTAL: 0.4 mg/dL (ref 0.3–1.2)
BUN: 24 mg/dL — AB (ref 6–23)
CHLORIDE: 102 meq/L (ref 96–112)
CO2: 25 mmol/L (ref 19–32)
CREATININE: 0.88 mg/dL (ref 0.50–1.10)
Calcium: 8.7 mg/dL (ref 8.4–10.5)
GFR calc non Af Amer: 63 mL/min — ABNORMAL LOW (ref >90)
GFR, EST AFRICAN AMERICAN: 73 mL/min — AB (ref >90)
GLUCOSE: 412 mg/dL — AB (ref 70–99)
POTASSIUM: 4 mmol/L (ref 3.5–5.1)
Sodium: 134 mmol/L — ABNORMAL LOW (ref 135–145)
TOTAL PROTEIN: 7 g/dL (ref 6.0–8.3)

## 2014-05-30 LAB — LEGIONELLA ANTIGEN, URINE

## 2014-05-30 LAB — CBC
HCT: 37.5 % (ref 36.0–46.0)
Hemoglobin: 11.3 g/dL — ABNORMAL LOW (ref 12.0–15.0)
MCH: 25.5 pg — ABNORMAL LOW (ref 26.0–34.0)
MCHC: 30.1 g/dL (ref 30.0–36.0)
MCV: 84.5 fL (ref 78.0–100.0)
PLATELETS: 234 10*3/uL (ref 150–400)
RBC: 4.44 MIL/uL (ref 3.87–5.11)
RDW: 15.6 % — AB (ref 11.5–15.5)
WBC: 4.1 10*3/uL (ref 4.0–10.5)

## 2014-05-30 LAB — STREP PNEUMONIAE URINARY ANTIGEN: STREP PNEUMO URINARY ANTIGEN: NEGATIVE

## 2014-05-30 LAB — TROPONIN I: Troponin I: <0.03 ng/mL (ref <0.031)

## 2014-05-30 MED ORDER — INSULIN ASPART 100 UNIT/ML ~~LOC~~ SOLN
5.0000 [IU] | Freq: Once | SUBCUTANEOUS | Status: AC
  Administered 2014-05-30: 5 [IU] via SUBCUTANEOUS

## 2014-05-30 MED ORDER — METHYLPREDNISOLONE SODIUM SUCC 125 MG IJ SOLR
60.0000 mg | Freq: Three times a day (TID) | INTRAMUSCULAR | Status: DC
  Administered 2014-05-30 – 2014-05-31 (×3): 60 mg via INTRAVENOUS
  Filled 2014-05-30 (×6): qty 0.96

## 2014-05-30 MED ORDER — LEVALBUTEROL HCL 0.63 MG/3ML IN NEBU: 0.6300 mg | INHALATION_SOLUTION | Freq: Four times a day (QID) | RESPIRATORY_TRACT | Status: DC

## 2014-05-30 MED ORDER — ACETAMINOPHEN 325 MG PO TABS
325.0000 mg | ORAL_TABLET | ORAL | Status: DC | PRN
  Administered 2014-05-30: 325 mg via ORAL
  Filled 2014-05-30: qty 1

## 2014-05-30 MED ORDER — INSULIN ASPART 100 UNIT/ML ~~LOC~~ SOLN
0.0000 [IU] | Freq: Three times a day (TID) | SUBCUTANEOUS | Status: DC
  Administered 2014-05-30: 20 [IU] via SUBCUTANEOUS
  Administered 2014-05-30: 4 [IU] via SUBCUTANEOUS
  Administered 2014-05-31 (×2): 15 [IU] via SUBCUTANEOUS
  Administered 2014-05-31 – 2014-06-01 (×2): 4 [IU] via SUBCUTANEOUS
  Administered 2014-06-01: 11 [IU] via SUBCUTANEOUS
  Administered 2014-06-02: 3 [IU] via SUBCUTANEOUS
  Administered 2014-06-02 – 2014-06-03 (×2): 7 [IU] via SUBCUTANEOUS

## 2014-05-30 MED ORDER — GUAIFENESIN 100 MG/5ML PO SOLN: 200.0000 mg | ORAL | Status: DC | PRN

## 2014-05-30 MED ORDER — INSULIN ASPART 100 UNIT/ML ~~LOC~~ SOLN
0.0000 [IU] | Freq: Every day | SUBCUTANEOUS | Status: DC
  Administered 2014-05-30 – 2014-06-01 (×3): 2 [IU] via SUBCUTANEOUS

## 2014-05-30 MED ORDER — INSULIN NPH (HUMAN) (ISOPHANE) 100 UNIT/ML ~~LOC~~ SUSP
25.0000 [IU] | Freq: Two times a day (BID) | SUBCUTANEOUS | Status: DC
  Administered 2014-05-30 – 2014-06-03 (×8): 25 [IU] via SUBCUTANEOUS

## 2014-05-30 MED ORDER — RACEPINEPHRINE HCL 2.25 % IN NEBU
0.5000 mL | INHALATION_SOLUTION | Freq: Four times a day (QID) | RESPIRATORY_TRACT | Status: DC
  Administered 2014-05-30 – 2014-05-31 (×7): 0.5 mL via RESPIRATORY_TRACT
  Filled 2014-05-30 (×9): qty 0.5

## 2014-05-30 NOTE — Progress Notes (Addendum)
[facility] PROGRESS NOTE  [name] KWI:[id number] DOB: March 14, 1941 DOA: 05/28/2014 PCP: [name], MD  Brief HPI 73 year old Caucasian female admitted with shortness of breath, cough. She has a history of vocal cord dysfunction. She was admitted for community-acquired pneumonia.  Assessment/Plan:  Community-acquired pneumonia (CAP) Patient will be continued on ceftriaxone and azithromycin. She is afebrile. Continue Mucinex. Add Robitussin as needed. Slow to improve.   Wheezing Most likely due to the above. However, there is also an element of vocal cord dysfunction. See has required racemic epinephrine nebulizers in the past. Continue to monitor for now. Pt has a documented allergy to albuterol. We will try Xopenex. Continue steroids, although we will decrease the dose. She states she is feeling better.  DM2 CBG is very high, presumably due to steroids. Increase her long-acting insulin. Change as a side to resistant coverage. Decreased dose of steroids as mentioned above will help. Check HbA1c.  Essential hypertension  BP stable. Cont losartan/hctz as tolerated  Elevated troponin Pt is asymptomatic and denies chest pain or nausea. Troponin was only minimally elevated. Today's normal. Await echocardiogram. If echocardiogram does not show any significant changes. No further workup will be necessary. Continue aspirin. Repeat EKG is unremarkable. Suspect possible demand-mismatch related to reactive airways.  DVT prophylaxis: Heparin subq Code Status: Full code Family Communication: Discussed with patient Disposition Plan: Await improvement. Mobilize. Possibly home in the next 24-48 hours.  Consultants:  None  Procedures:  None  Antibiotics:  Rocephin 12/28>>>  Azithromycin 12/28>>>   Subjective: Feels better but still wheezing. Still has a cough with yellowish expectoration.   Objective: Filed Vitals:   05/30/14 0059 05/30/14 0449 05/30/14 0517 05/30/14  0530  BP:   196/67 155/68  Pulse:   57 80  Temp:   97.8 F (36.6 C)   TempSrc:   Oral   Resp:   16   Height:      Weight:      SpO2: 95% 97% 98%     Intake/Output Summary (Last 24 hours) at 05/30/14 0824 Last data filed at 05/30/14 0518  Gross per 24 hour  Intake    240 ml  Output   2250 ml  Net  -2010 ml   Filed Weights   05/28/14 1148  Weight: 83.008 kg (183 lb)    Exam:   General:  Awake, in nad  Cardiovascular: regular, s1, s2  Respiratory: End-expiratory wheezing bilaterally. No rhonchi. Few crackles at the bases bilaterally.   Abdomen: soft, obese, nondistended. Nontender.  Musculoskeletal: perfused, no clubbing   Data Reviewed: Basic Metabolic Panel:  Recent Labs Lab 05/28/14 0246 05/30/14 0208  NA 136 134*  K 4.0 4.0  CL 104 102  CO2 25 25  GLUCOSE 276* 412*  BUN 17 24*  CREATININE 0.83 0.88  CALCIUM 8.5 8.7   Liver Function Tests:  Recent Labs Lab 05/30/14 0208  AST 23  ALT 18  ALKPHOS 83  BILITOT 0.4  PROT 7.0  ALBUMIN 3.4*   CBC:  Recent Labs Lab 05/28/14 0246 05/30/14 0208  WBC 5.8 4.1  NEUTROABS 4.0  --   HGB 11.5* 11.3*  HCT 38.1 37.5  MCV 84.7 84.5  PLT 276 234   Cardiac Enzymes:  Recent Labs Lab 05/29/14 0140 05/29/14 0635 05/29/14 1340 05/29/14 2000 05/30/14 0208  TROPONINI 0.06* 0.14* 0.07* 0.04* <0.03   CBG:  Recent Labs Lab 05/29/14 1659 05/29/14 2143 05/29/14 2235 05/30/14 0206 05/30/14 0721  GLUCAP 280* 456* 475* 414* 340*  Recent Results (from the past 240 hour(s))  Culture, blood (routine x 2) Call MD if unable to obtain prior to antibiotics being given     Status: None (Preliminary result)   Collection Time: 05/28/14  6:22 AM  Result Value Ref Range Status   Specimen Description BLOOD RIGHT FOREARM  Final   Special Requests BOTTLES DRAWN AEROBIC AND ANAEROBIC 5CC  Final   Culture   Final           BLOOD CULTURE RECEIVED NO GROWTH TO DATE CULTURE WILL BE HELD FOR 5 DAYS BEFORE ISSUING  A FINAL NEGATIVE REPORT Performed at [facility]    Report Status PENDING  Incomplete  Culture, blood (routine x 2) Call MD if unable to obtain prior to antibiotics being given     Status: None (Preliminary result)   Collection Time: 05/28/14  6:22 AM  Result Value Ref Range Status   Specimen Description BLOOD RIGHT ANTECUBITAL  Final   Special Requests BOTTLES DRAWN AEROBIC AND ANAEROBIC 5CC  Final   Culture   Final           BLOOD CULTURE RECEIVED NO GROWTH TO DATE CULTURE WILL BE HELD FOR 5 DAYS BEFORE ISSUING A FINAL NEGATIVE REPORT Performed at [facility]    Report Status PENDING  Incomplete  Culture, sputum-assessment     Status: None   Collection Time: 05/28/14 11:13 PM  Result Value Ref Range Status   Specimen Description SPUTUM  Final   Special Requests NONE  Final   Sputum evaluation   Final    THIS SPECIMEN IS ACCEPTABLE. RESPIRATORY CULTURE REPORT TO FOLLOW.   Report Status 05/29/2014 FINAL  Final  Culture, respiratory (NON-Expectorated)     Status: None (Preliminary result)   Collection Time: 05/28/14 11:13 PM  Result Value Ref Range Status   Specimen Description SPUTUM  Final   Special Requests NONE  Final   Gram Stain   Final    FEW WBC PRESENT,BOTH PMN AND MONONUCLEAR RARE SQUAMOUS EPITHELIAL CELLS PRESENT NO ORGANISMS SEEN Performed at [facility]    Culture   Final    NO GROWTH 1 DAY Performed at [facility]    Report Status PENDING  Incomplete     Studies: No results found.  Scheduled Meds: . aspirin  81 mg Oral Daily  . azithromycin  500 mg Intravenous Q24H  . cefTRIAXone (ROCEPHIN)  IV  1 g Intravenous Q24H  . cholecalciferol  5,000 Units Oral Daily  . citalopram  40 mg Oral Daily  . famotidine (PEPCID) IV  20 mg Intravenous Q12H  . furosemide  40 mg Oral Daily  . glipiZIDE  10 mg Oral BID WC  . guaiFENesin  600 mg Oral BID  . heparin  5,000 Units Subcutaneous 3 times per day  . losartan  50 mg Oral Daily    And  . hydrochlorothiazide  12.5 mg Oral Daily  . insulin aspart  0-20 Units Subcutaneous TID WC  . insulin aspart  0-5 Units Subcutaneous QHS  . insulin aspart  2 Units Subcutaneous TID WC  . insulin NPH Human  25 Units Subcutaneous BID AC & HS  . levalbuterol  0.63 mg Nebulization Q6H  . loratadine  10 mg Oral Daily  . magnesium oxide  200 mg Oral Daily  . methylPREDNISolone (SOLU-MEDROL) injection  60 mg Intravenous 3 times per day  . pantoprazole  80 mg Oral Daily  . Racepinephrine HCl  0.5 mL Nebulization QID  Continuous Infusions:   Principal Problem:   CAP (community acquired pneumonia) Active Problems:   DM2 (diabetes mellitus, type 2)   Cough   Stridor   Elevated troponin  Time spent: 59min  Ariston Grandison  [facility] Pager [phone].   If 7PM-7AM, please contact night-coverage at [email or web address], password [facility] 05/30/2014, 8:24 AM  LOS: 2 days

## 2014-05-30 NOTE — Progress Notes (Signed)
OT Cancellation Note  Patient Details Name: KRISS PERLEBERG MRN: [id number] DOB: Jul 16, 1940   Cancelled Treatment:    Reason Eval/Treat Not Completed: Other (comment) .  Pt has only been back to bed about 10 minutes and reports she slept poorly.  Would prefer OT come back in the am.  Will check back.  Bowie Delia 05/30/2014, 2:19 PM  Lesle Chris, OTR/L [phone] 05/30/2014

## 2014-05-30 NOTE — Progress Notes (Signed)
Echocardiogram 2D Echocardiogram has been performed.  [name] 05/30/2014, 1:48 PM

## 2014-05-30 NOTE — Progress Notes (Signed)
CRITICAL VALUE ALERT  Critical value received:  CBG 414  Date of notification:  05/30/14  Time of notification:  0206  Critical value read back:YES  Nurse who received alert: Tor Netters, RN  MD notified (1st page):  Tylene Fantasia, NP  Time of first page:  0206  MD notified (2nd page):  Time of second page:  Responding MD:  Tylene Fantasia  Time MD responded:  0938

## 2014-05-30 NOTE — Evaluation (Signed)
Physical Therapy Evaluation Patient Details Name: [name] MRN: [id number] DOB: June 14, 1940 Today's Date: 05/30/2014   History of Present Illness  73 yo female admitted with pna, hyperglycemia. Hx of HTn, DM, gout, obesity, ventral hernia, multiple joint pain.   Clinical Impression  On eval, pt required Min guard assist for mobility-able to ambulate ~75 feet with RW. Fatigued fairly easily. O2 sats 95% on RA-pt is on continuous pulse ox monitoring. Do not anticipate any follow up PT needs however pt should use RW for ambulation (pt has RW and electric scooter at home). Recommend daily mobility with nursing supervision.     Follow Up Recommendations No PT follow up;Supervision/Assistance - 24 hour    Equipment Recommendations  None recommended by PT (pt has DME)    Recommendations for Other Services       Precautions / Restrictions Precautions Precautions: Fall Precaution Comments: droplet Restrictions Weight Bearing Restrictions: No      Mobility  Bed Mobility Overal bed mobility: Modified Independent                Transfers Overall transfer level: Needs assistance Equipment used: Rolling walker (2 wheeled) Transfers: Sit to/from Stand Sit to Stand: Min guard         General transfer comment: pt uses momentum/rocks back and forth a few times before being able to stand. close guard for safety  Ambulation/Gait Ambulation/Gait assistance: Min guard Ambulation Distance (Feet): 75 Feet Assistive device: Rolling walker (2 wheeled) (15/x1, 75'x1) Gait Pattern/deviations: Step-through pattern;Decreased stride length     General Gait Details: "bad L hip" gait appears antalgic with pt only using IV pole for support. Switched to using RW, improved gait pattern. pt fatigues fairly easily. O2 sats 95% on RA.   Stairs            Wheelchair Mobility    Modified Rankin (Stroke Patients Only)       Balance                                              Pertinent Vitals/Pain Pain Assessment: Faces Faces Pain Scale: Hurts little more Pain Location: chest when coughing Pain Intervention(s): Monitored during session    Home Living Family/patient expects to be discharged to:: Private residence Living Arrangements: Spouse/significant other   Type of Home: House Home Access: Stairs to enter Entrance Stairs-Rails: Right Entrance Stairs-Number of Steps: 7 Home Layout: Two level;Able to live on main level with bedroom/bathroom Home Equipment: Youth worker - 2 wheels;Cane - single point      Prior Function Level of Independence: Independent               Hand Dominance        Extremity/Trunk Assessment   Upper Extremity Assessment: Generalized weakness           Lower Extremity Assessment: Generalized weakness      Cervical / Trunk Assessment: Kyphotic  Communication   Communication: No difficulties  Cognition Arousal/Alertness: Awake/alert Behavior During Therapy: WFL for tasks assessed/performed Overall Cognitive Status: Within Functional Limits for tasks assessed                      General Comments      Exercises        Assessment/Plan    PT Assessment Patient needs continued PT services  PT Diagnosis Difficulty walking;Abnormality  of gait;Generalized weakness   PT Problem List Decreased strength;Decreased activity tolerance;Decreased balance;Decreased mobility;Obesity;Pain  PT Treatment Interventions Gait training;DME instruction;Functional mobility training;Therapeutic activities;Patient/family education;Balance training   PT Goals (Current goals can be found in the Care Plan section) Acute Rehab PT Goals Patient Stated Goal: to feel beter PT Goal Formulation: With patient Time For Goal Achievement: 06/13/14 Potential to Achieve Goals: Good    Frequency Min 3X/week   Barriers to discharge        Co-evaluation               End of Session    Activity Tolerance: Patient limited by fatigue Patient left: in chair;with call bell/phone within reach           Time: 1135-1154 PT Time Calculation (min) (ACUTE ONLY): 19 min   Charges:   PT Evaluation $Initial PT Evaluation Tier I: 1 Procedure PT Treatments $Gait Training: 8-22 mins   PT G Codes:        Weston Anna, MPT Pager: [phone]

## 2014-05-30 NOTE — Progress Notes (Signed)
Pt's CBG at 0206 am was 414. Lab drawn blood glucose at 0208 am was 412. NP Forrest Moron text paged and notified of critical result at 0208 am. NP wrote a one-time order for 5 units of Novolog at 0230 am. RN administered the ordered insulin at 0254 am. Will continue to monitor.

## 2014-05-31 DIAGNOSIS — E1165 Type 2 diabetes mellitus with hyperglycemia: Secondary | ICD-10-CM

## 2014-05-31 LAB — GLUCOSE, CAPILLARY
GLUCOSE-CAPILLARY: 330 mg/dL — AB (ref 70–99)
GLUCOSE-CAPILLARY: 197 mg/dL — AB (ref 70–99)
Glucose-Capillary: 302 mg/dL — ABNORMAL HIGH (ref 70–99)
Glucose-Capillary: 233 mg/dL — ABNORMAL HIGH (ref 70–99)

## 2014-05-31 LAB — HEMOGLOBIN A1C
Hgb A1c MFr Bld: 8.8 % — ABNORMAL HIGH (ref <5.7)
Mean Plasma Glucose: 206 mg/dL — ABNORMAL HIGH (ref <117)

## 2014-05-31 LAB — CULTURE, RESPIRATORY W GRAM STAIN: Culture: NORMAL

## 2014-05-31 LAB — CULTURE, RESPIRATORY

## 2014-05-31 MED ORDER — CITALOPRAM HYDROBROMIDE 20 MG PO TABS
20.0000 mg | ORAL_TABLET | Freq: Every day | ORAL | Status: DC
  Administered 2014-05-31 – 2014-06-03 (×4): 20 mg via ORAL
  Filled 2014-05-31 (×4): qty 1

## 2014-05-31 MED ORDER — METHYLPREDNISOLONE SODIUM SUCC 125 MG IJ SOLR
60.0000 mg | Freq: Two times a day (BID) | INTRAMUSCULAR | Status: DC
  Administered 2014-05-31 – 2014-06-03 (×6): 60 mg via INTRAVENOUS
  Filled 2014-05-31 (×8): qty 0.96

## 2014-05-31 MED ORDER — RACEPINEPHRINE HCL 2.25 % IN NEBU
0.5000 mL | INHALATION_SOLUTION | RESPIRATORY_TRACT | Status: DC | PRN
  Administered 2014-05-31 – 2014-06-01 (×2): 0.5 mL via RESPIRATORY_TRACT
  Filled 2014-05-31 (×4): qty 0.5

## 2014-05-31 MED ORDER — IPRATROPIUM BROMIDE 0.02 % IN SOLN
0.5000 mg | Freq: Three times a day (TID) | RESPIRATORY_TRACT | Status: DC
  Administered 2014-05-31 – 2014-06-02 (×5): 0.5 mg via RESPIRATORY_TRACT
  Filled 2014-05-31 (×5): qty 2.5

## 2014-05-31 NOTE — Progress Notes (Signed)
[facility] PROGRESS NOTE  TELESHA DEGUZMAN ZYS:[id number] DOB: 03-May-1941 DOA: 05/28/2014 PCP: [name], MD  Brief HPI 73 year old Caucasian female admitted with shortness of breath, cough. She has a history of vocal cord dysfunction. She was admitted for community-acquired pneumonia.  Assessment/Plan:  Community-acquired pneumonia (CAP) Very slow to improve due to concomitant wheezing. Patient will be continued on ceftriaxone and azithromycin. She is afebrile. Continue Mucinex. Add Robitussin as needed.   Wheezing Most likely due to the above. However, there is also an element of vocal cord dysfunction. Continue when necessary racemic epinephrine nebulizers. Continue to monitor for now. Pt has a documented allergy to albuterol. Continue steroids, although we will decrease the dose further. She states she is feeling better.  DM2 with hyperglycemia CBG is very high, presumably due to steroids. Her long-acting insulin was increased yesterday. We will further decrease the dose of her steroids, which should help. Continue resistant coverage. HbA1c is pending.  Essential hypertension  BP stable. Cont losartan/hctz as tolerated  Elevated troponin Pt is asymptomatic and denies chest pain or nausea. Troponin was only minimally elevated and could be due to her respiratory process. Echocardiogram does not show any wall motion abnormalities. Normal EF was noted. No further workup at this time. Continue aspirin. Repeat EKG is unremarkable.   DVT prophylaxis: Heparin subq Code Status: Full code Family Communication: Discussed with patient Disposition Plan: Await improvement. Mobilize.   Consultants:  None  Procedures: 2 D ECHO Study Conclusions - Left ventricle: The cavity size was normal. Wall thickness wasincreased in a pattern of mild LVH. Systolic function was normal.The estimated ejection fraction was in the range of 60% to 65%.Wall motion was normal; there were no regional  wall motionabnormalities. Doppler parameters are consistent with abnormalleft ventricular relaxation (grade 1 diastolic dysfunction). - Left atrium: The atrium was mildly dilated.  Antibiotics:  Rocephin 12/28>>>  Azithromycin 12/28>>>   Subjective: Feels so weaker today. Coughing up brownish expectoration.  Objective: Filed Vitals:   05/30/14 1648 05/30/14 2126 05/30/14 2138 05/31/14 0535  BP:   155/80 127/58  Pulse:   79 71  Temp:   98.3 F (36.8 C) 98.8 F (37.1 C)  TempSrc:   Oral   Resp:   18 18  Height:      Weight:      SpO2: 99% 99% 100% 96%    Intake/Output Summary (Last 24 hours) at 05/31/14 0839 Last data filed at 05/31/14 0615  Gross per 24 hour  Intake    960 ml  Output   1400 ml  Net   -440 ml   Filed Weights   05/28/14 1148  Weight: 83.008 kg (183 lb)    Exam:   General:  Awake, in nad  Cardiovascular: regular, s1, s2  Respiratory: End-expiratory wheezing bilaterally. No rhonchi. Few crackles at the bases bilaterally.   Abdomen: soft, obese, nondistended. Nontender.  Musculoskeletal: perfused, no clubbing   Data Reviewed: Basic Metabolic Panel:  Recent Labs Lab 05/28/14 0246 05/30/14 0208  NA 136 134*  K 4.0 4.0  CL 104 102  CO2 25 25  GLUCOSE 276* 412*  BUN 17 24*  CREATININE 0.83 0.88  CALCIUM 8.5 8.7   Liver Function Tests:  Recent Labs Lab 05/30/14 0208  AST 23  ALT 18  ALKPHOS 83  BILITOT 0.4  PROT 7.0  ALBUMIN 3.4*   CBC:  Recent Labs Lab 05/28/14 0246 05/30/14 0208  WBC 5.8 4.1  NEUTROABS 4.0  --   HGB 11.5* 11.3*  HCT 38.1 37.5  MCV 84.7 84.5  PLT 276 234   Cardiac Enzymes:  Recent Labs Lab 05/29/14 0140 05/29/14 0635 05/29/14 1340 05/29/14 2000 05/30/14 0208  TROPONINI 0.06* 0.14* 0.07* 0.04* <0.03   CBG:  Recent Labs Lab 05/30/14 0721 05/30/14 1151 05/30/14 1619 05/30/14 2146 05/31/14 0726  GLUCAP 340* 373* 159* 209* 330*    Recent Results (from the past 240 hour(s))   Culture, blood (routine x 2) Call MD if unable to obtain prior to antibiotics being given     Status: None (Preliminary result)   Collection Time: 05/28/14  6:22 AM  Result Value Ref Range Status   Specimen Description BLOOD RIGHT FOREARM  Final   Special Requests BOTTLES DRAWN AEROBIC AND ANAEROBIC 5CC  Final   Culture   Final           BLOOD CULTURE RECEIVED NO GROWTH TO DATE CULTURE WILL BE HELD FOR 5 DAYS BEFORE ISSUING A FINAL NEGATIVE REPORT Performed at [facility]    Report Status PENDING  Incomplete  Culture, blood (routine x 2) Call MD if unable to obtain prior to antibiotics being given     Status: None (Preliminary result)   Collection Time: 05/28/14  6:22 AM  Result Value Ref Range Status   Specimen Description BLOOD RIGHT ANTECUBITAL  Final   Special Requests BOTTLES DRAWN AEROBIC AND ANAEROBIC 5CC  Final   Culture   Final           BLOOD CULTURE RECEIVED NO GROWTH TO DATE CULTURE WILL BE HELD FOR 5 DAYS BEFORE ISSUING A FINAL NEGATIVE REPORT Performed at [facility]    Report Status PENDING  Incomplete  Culture, sputum-assessment     Status: None   Collection Time: 05/28/14 11:13 PM  Result Value Ref Range Status   Specimen Description SPUTUM  Final   Special Requests NONE  Final   Sputum evaluation   Final    THIS SPECIMEN IS ACCEPTABLE. RESPIRATORY CULTURE REPORT TO FOLLOW.   Report Status 05/29/2014 FINAL  Final  Culture, respiratory (NON-Expectorated)     Status: None (Preliminary result)   Collection Time: 05/28/14 11:13 PM  Result Value Ref Range Status   Specimen Description SPUTUM  Final   Special Requests NONE  Final   Gram Stain   Final    FEW WBC PRESENT,BOTH PMN AND MONONUCLEAR RARE SQUAMOUS EPITHELIAL CELLS PRESENT NO ORGANISMS SEEN Performed at [facility]    Culture   Final    NO GROWTH 1 DAY Performed at [facility]    Report Status PENDING  Incomplete     Studies: No results found.  Scheduled  Meds: . aspirin  81 mg Oral Daily  . azithromycin  500 mg Intravenous Q24H  . cefTRIAXone (ROCEPHIN)  IV  1 g Intravenous Q24H  . cholecalciferol  5,000 Units Oral Daily  . citalopram  40 mg Oral Daily  . famotidine (PEPCID) IV  20 mg Intravenous Q12H  . furosemide  40 mg Oral Daily  . glipiZIDE  10 mg Oral BID WC  . guaiFENesin  600 mg Oral BID  . heparin  5,000 Units Subcutaneous 3 times per day  . losartan  50 mg Oral Daily   And  . hydrochlorothiazide  12.5 mg Oral Daily  . insulin aspart  0-20 Units Subcutaneous TID WC  . insulin aspart  0-5 Units Subcutaneous QHS  . insulin aspart  2 Units Subcutaneous TID WC  . insulin NPH Human  25 Units Subcutaneous BID AC & HS  . loratadine  10 mg Oral Daily  . magnesium oxide  200 mg Oral Daily  . methylPREDNISolone (SOLU-MEDROL) injection  60 mg Intravenous Q12H  . pantoprazole  80 mg Oral Daily  . Racepinephrine HCl  0.5 mL Nebulization QID   Continuous Infusions:   Principal Problem:   CAP (community acquired pneumonia) Active Problems:   DM2 (diabetes mellitus, type 2)   Cough   Stridor   Elevated troponin  Time spent: 61min  Cassiopeia Florentino  [facility] Pager [phone].   If 7PM-7AM, please contact night-coverage at [email or web address], password [facility] 05/31/2014, 8:39 AM  LOS: 3 days

## 2014-05-31 NOTE — Progress Notes (Signed)
Pt denied dizziness while obtaining orthostatics rather stated "My head feels full"... Notable change in "full feeling in head" with position changes. Orthostatics recorded on flow sheet. Assisted to BR. Gait slightly unsteady. Call light in reach with verbalized understanding to call for assistance as needed.

## 2014-05-31 NOTE — Progress Notes (Signed)
Inpatient Diabetes Program Recommendations  AACE/ADA: New Consensus Statement on Inpatient Glycemic Control (2013)  Target Ranges:  Prepandial:   less than 140 mg/dL      Peak postprandial:   less than 180 mg/dL (1-2 hours)      Critically ill patients:  140 - 180 mg/dL     Results for [name], [name] (MRN [id number]) as of 05/31/2014 13:56  Ref. Range 05/31/2014 07:26 05/31/2014 12:12  Glucose-Capillary Latest Range: 70-99 mg/dL 330 (H) 302 (H)     Current Insulin Orders: NPH insulin- 25 units bid       Novolog Resistant SSI tid ac + HS      Novolog 2 units tidwc      Glipizide 10 mg bid   **Note IV steroids decreased today.  **Also note NPH insulin and SSI both increased yesterday.  **Patient eating 75-100% of meals.    MD- Please consider increasing Novolog Meal Coverage to 4 units tid with meals while on IV steroids     Will follow [name] RN, MSN, CDE Diabetes Coordinator Inpatient Diabetes Program Team Pager: [phone] (8a-10p)

## 2014-05-31 NOTE — Evaluation (Signed)
Occupational Therapy Evaluation Patient Details Name: [name] MRN: [id number] DOB: 09-08-1940 Today's Date: 05/31/2014    History of Present Illness 73 yo female admitted with pna, hyperglycemia. Hx of HTn, DM, gout, obesity, ventral hernia, multiple joint pain.    Clinical Impression   Pt reports fatiguing more easily currently than at home. Discussed energy conservation techniques and AE options that can help with managing her ADL. Pt was 96% on RA with activity. Left off O2 at end of session and nursing aware. Pt would like to walk later and informed nursing of this also. Will follow for acute OT.    Follow Up Recommendations  No OT follow up;Supervision/Assistance - 24 hour    Equipment Recommendations  None recommended by OT    Recommendations for Other Services       Precautions / Restrictions Precautions Precautions: Fall Restrictions Weight Bearing Restrictions: No      Mobility Bed Mobility                  Transfers Overall transfer level: Needs assistance Equipment used: Rolling walker (2 wheeled) Transfers: Sit to/from Stand Sit to Stand: Min guard         General transfer comment: min guard for safety    Balance                                            ADL Overall ADL's : Needs assistance/impaired Eating/Feeding: Independent;Sitting   Grooming: Wash/dry hands;Oral care;Min guard;Standing   Upper Body Bathing: Set up;Sitting   Lower Body Bathing: Min guard;Sit to/from stand   Upper Body Dressing : Set up;Sitting   Lower Body Dressing: Min guard;Sit to/from stand   Toilet Transfer: Min guard;Ambulation;Comfort height toilet;RW   Toileting- Water quality scientist and Hygiene: Min guard;Sit to/from stand         General ADL Comments: Discussed energy conservation techniques which pt is familiar with many of these from having to take plenty of rest breaks even at home PTA and pace herself. She sits to perform  grooming at home. Pt interested in Ae options so educated on all AE. pt interested in obtaining LHS to make bathing easier and possibly the reacher. O2 sats on RA wtih activity 96% so left off O2 at end of session and made nursing aware. Pt describes her head "feels full" when up but doesnt report dizziness.      Vision                     Perception     Praxis      Pertinent Vitals/Pain Pain Assessment: No/denies pain     Hand Dominance     Extremity/Trunk Assessment Upper Extremity Assessment Upper Extremity Assessment: Generalized weakness           Communication Communication Communication: No difficulties   Cognition Arousal/Alertness: Awake/alert Behavior During Therapy: WFL for tasks assessed/performed Overall Cognitive Status: Within Functional Limits for tasks assessed                     General Comments       Exercises       Shoulder Instructions      Home Living Family/patient expects to be discharged to:: Private residence Living Arrangements: Spouse/significant other   Type of Home: House       Home Layout: Two level;Able to  live on main level with bedroom/bathroom     Bathroom Shower/Tub: Occupational psychologist: Handicapped height     Home Equipment: Youth worker - 2 wheels;Cane - single point;Shower seat          Prior Functioning/Environment Level of Independence: Independent        Comments: pt states she struggled to do ADL and fatigued easily. had to perform many ADL seated. She took lot of rest breaks.     OT Diagnosis: Generalized weakness   OT Problem List: Decreased strength;Decreased knowledge of use of DME or AE;Decreased activity tolerance   OT Treatment/Interventions: Self-care/ADL training;Patient/family education;Therapeutic activities;DME and/or AE instruction    OT Goals(Current goals can be found in the care plan section) Acute Rehab OT Goals Patient Stated Goal: to feel  beter OT Goal Formulation: With patient Time For Goal Achievement: 06/14/14 Potential to Achieve Goals: Good  OT Frequency: Min 2X/week   Barriers to D/C:            Co-evaluation              End of Session Equipment Utilized During Treatment: Rolling walker  Activity Tolerance: Patient tolerated treatment well Patient left: in chair;with call bell/phone within reach   Time: 1110-1137 OT Time Calculation (min): 27 min Charges:  OT General Charges $OT Visit: 1 Procedure OT Evaluation $Initial OT Evaluation Tier I: 1 Procedure OT Treatments $Self Care/Home Management : 8-22 mins $Therapeutic Activity: 8-22 mins G-Codes:    Jules Schick  [phone] 05/31/2014, 11:55 AM

## 2014-06-01 LAB — CBC
HCT: 34.7 % — ABNORMAL LOW (ref 36.0–46.0)
Hemoglobin: 10.7 g/dL — ABNORMAL LOW (ref 12.0–15.0)
MCH: 25.7 pg — ABNORMAL LOW (ref 26.0–34.0)
MCHC: 30.8 g/dL (ref 30.0–36.0)
MCV: 83.2 fL (ref 78.0–100.0)
PLATELETS: 277 10*3/uL (ref 150–400)
RBC: 4.17 MIL/uL (ref 3.87–5.11)
RDW: 15.6 % — AB (ref 11.5–15.5)
WBC: 9.8 10*3/uL (ref 4.0–10.5)

## 2014-06-01 LAB — GLUCOSE, CAPILLARY
GLUCOSE-CAPILLARY: 269 mg/dL — AB (ref 70–99)
Glucose-Capillary: 196 mg/dL — ABNORMAL HIGH (ref 70–99)
Glucose-Capillary: 89 mg/dL (ref 70–99)
Glucose-Capillary: 207 mg/dL — ABNORMAL HIGH (ref 70–99)

## 2014-06-01 LAB — BASIC METABOLIC PANEL
Anion gap: 10 (ref 5–15)
BUN: 29 mg/dL — AB (ref 6–23)
CO2: 26 mmol/L (ref 19–32)
CREATININE: 0.79 mg/dL (ref 0.50–1.10)
Calcium: 8.4 mg/dL (ref 8.4–10.5)
Chloride: 104 mEq/L (ref 96–112)
GFR calc Af Amer: >90 mL/min (ref >90)
GFR, EST NON AFRICAN AMERICAN: 79 mL/min — AB (ref >90)
GLUCOSE: 187 mg/dL — AB (ref 70–99)
POTASSIUM: 3.4 mmol/L — AB (ref 3.5–5.1)
Sodium: 140 mmol/L (ref 135–145)

## 2014-06-01 MED ORDER — POTASSIUM CHLORIDE CRYS ER 20 MEQ PO TBCR
40.0000 meq | EXTENDED_RELEASE_TABLET | Freq: Once | ORAL | Status: AC
  Administered 2014-06-01: 40 meq via ORAL
  Filled 2014-06-01: qty 2

## 2014-06-01 MED ORDER — FLUTICASONE PROPIONATE HFA 110 MCG/ACT IN AERO
2.0000 | INHALATION_SPRAY | Freq: Two times a day (BID) | RESPIRATORY_TRACT | Status: DC
  Administered 2014-06-01 – 2014-06-02 (×3): 2 via RESPIRATORY_TRACT
  Filled 2014-06-01: qty 12

## 2014-06-01 NOTE — Progress Notes (Signed)
Physical Therapy Treatment Patient Details Name: [name] MRN: [id number] DOB: 1940/09/02 Today's Date: 06/01/2014    History of Present Illness 74 yo female admitted with pna, hyperglycemia. Hx of HTn, DM, gout, obesity, ventral hernia, multiple joint pain.     PT Comments    **Significant progress with mobility today. Pt walked 400' with RW without loss of balance, SaO2 95-97% on RA, HR 122 with walking. 2/4 dyspnea. Instructed pt in seated BLE exercises for strengthening. *  Follow Up Recommendations  No PT follow up;Supervision for mobility/OOB     Equipment Recommendations  None recommended by PT (pt has DME)    Recommendations for Other Services       Precautions / Restrictions Precautions Precautions: Fall Restrictions Weight Bearing Restrictions: No    Mobility  Bed Mobility Overal bed mobility: Modified Independent                Transfers Overall transfer level: Needs assistance Equipment used: Rolling walker (2 wheeled)   Sit to Stand: Modified independent (Device/Increase time)            Ambulation/Gait Ambulation/Gait assistance: Supervision Ambulation Distance (Feet): 400 Feet Assistive device: Rolling walker (2 wheeled) Gait Pattern/deviations: WFL(Within Functional Limits)   Gait velocity interpretation: Below normal speed for age/gender General Gait Details: SaO2 95-97% on RA walking, HR 122 walking, 2/4 dyspnea   Stairs            Wheelchair Mobility    Modified Rankin (Stroke Patients Only)       Balance                                    Cognition Arousal/Alertness: Awake/alert Behavior During Therapy: WFL for tasks assessed/performed Overall Cognitive Status: Within Functional Limits for tasks assessed                      Exercises General Exercises - Lower Extremity Ankle Circles/Pumps: AROM;Both;10 reps;Seated Long Arc Quad: AROM;Both;10 reps;Seated Hip Flexion/Marching:  AROM;Both;5 reps;Seated    General Comments        Pertinent Vitals/Pain Pain Assessment: No/denies pain    Home Living                      Prior Function            PT Goals (current goals can now be found in the care plan section) Acute Rehab PT Goals Patient Stated Goal: to get strength back PT Goal Formulation: With patient/family Time For Goal Achievement: 06/13/14 Potential to Achieve Goals: Good Progress towards PT goals: Progressing toward goals    Frequency  Min 3X/week    PT Plan Current plan remains appropriate    Co-evaluation             End of Session   Activity Tolerance: Patient tolerated treatment well Patient left: with call bell/phone within reach;in bed;with family/visitor present     Time: 2800-3491 PT Time Calculation (min) (ACUTE ONLY): 23 min  Charges:  $Gait Training: 8-22 mins $Therapeutic Exercise: 8-22 mins                    G Codes:      Philomena Doheny 06/01/2014, 10:20 AM [phone]

## 2014-06-01 NOTE — Progress Notes (Signed)
[facility] PROGRESS NOTE  SAUNDRA GIN DGU:[id number] DOB: 1941/01/11 DOA: 05/28/2014 PCP: [name], MD  Brief HPI 74 year old Caucasian female admitted with shortness of breath, cough. She has a history of vocal cord dysfunction. She was admitted for community-acquired pneumonia and wheezing. She is very slow to improve.  Assessment/Plan:  Community-acquired pneumonia (CAP) Very slow to improve due to concomitant wheezing. Patient will be continued on ceftriaxone and azithromycin. She is afebrile. Continue Mucinex. Robitussin as needed.   Wheezing Most likely due to the above. However, there is also an element of vocal cord dysfunction. Continue when necessary racemic epinephrine nebulizers. We will add inhaled steroids. Pt has a documented allergy to albuterol. She is on Atrovent as well. Continue steroids at current dose for now. She's been told that she should see a pulmonologist as an outpatient for her vocal cord dysfunction and wheezing. Might benefit from seeing ENT as well.  DM2 with hyperglycemia CBG was very high, presumably due to steroids. Her long-acting insulin was increased. CBGs could be improving as steroid has been decreased. Continue resistant coverage. HbA1c is 8.8.  Essential hypertension  BP stable. Cont losartan/hctz as tolerated. Replace potassium  Elevated troponin Pt is asymptomatic and denies chest pain or nausea. Troponin was only minimally elevated and could be due to her respiratory process. Echocardiogram does not show any wall motion abnormalities. Normal EF was noted. No further workup at this time. Continue aspirin. Repeat EKG is unremarkable.   DVT prophylaxis: Heparin subq Code Status: Full code Family Communication: Discussed with patient Disposition Plan: Await improvement. Mobilize.   Consultants:  None  Procedures: 2 D ECHO Study Conclusions - Left ventricle: The cavity size was normal. Wall thickness wasincreased in a  pattern of mild LVH. Systolic function was normal.The estimated ejection fraction was in the range of 60% to 65%.Wall motion was normal; there were no regional wall motionabnormalities. Doppler parameters are consistent with abnormalleft ventricular relaxation (grade 1 diastolic dysfunction). - Left atrium: The atrium was mildly dilated.  Antibiotics:  Rocephin 12/28>>>  Azithromycin 12/28>>>   Subjective: Feels better. Did have a rough night with wheezing and coughing.   Objective: Filed Vitals:   05/31/14 1340 05/31/14 1739 05/31/14 2150 06/01/14 0530  BP:   152/50 146/69  Pulse:   78 51  Temp:   97.8 F (36.6 C) 98.2 F (36.8 C)  TempSrc:   Oral Oral  Resp:   16 18  Height:      Weight:      SpO2: 94% 97% 98% 100%    Intake/Output Summary (Last 24 hours) at 06/01/14 0917 Last data filed at 06/01/14 0500  Gross per 24 hour  Intake    600 ml  Output   1875 ml  Net  -1275 ml   Filed Weights   05/28/14 1148  Weight: 83.008 kg (183 lb)    Exam:   General:  Awake, in nad  Cardiovascular: regular, s1, s2  Respiratory: End-expiratory wheezing bilaterally. No rhonchi. Few crackles at the bases bilaterally. Improved air entry bilaterally.  Abdomen: soft, obese, nondistended. Nontender.  Musculoskeletal: No edema.  Data Reviewed: Basic Metabolic Panel:  Recent Labs Lab 05/28/14 0246 05/30/14 0208 06/01/14 0525  NA 136 134* 140  K 4.0 4.0 3.4*  CL 104 102 104  CO2 25 25 26   GLUCOSE 276* 412* 187*  BUN 17 24* 29*  CREATININE 0.83 0.88 0.79  CALCIUM 8.5 8.7 8.4   Liver Function Tests:  Recent Labs Lab 05/30/14 0208  AST 23  ALT 18  ALKPHOS 83  BILITOT 0.4  PROT 7.0  ALBUMIN 3.4*   CBC:  Recent Labs Lab 05/28/14 0246 05/30/14 0208 06/01/14 0525  WBC 5.8 4.1 9.8  NEUTROABS 4.0  --   --   HGB 11.5* 11.3* 10.7*  HCT 38.1 37.5 34.7*  MCV 84.7 84.5 83.2  PLT 276 234 277   Cardiac Enzymes:  Recent Labs Lab 05/29/14 0140  05/29/14 0635 05/29/14 1340 05/29/14 2000 05/30/14 0208  TROPONINI 0.06* 0.14* 0.07* 0.04* <0.03   CBG:  Recent Labs Lab 05/31/14 0726 05/31/14 1212 05/31/14 1636 05/31/14 2204 06/01/14 0828  GLUCAP 330* 302* 197* 233* 196*    Recent Results (from the past 240 hour(s))  Culture, blood (routine x 2) Call MD if unable to obtain prior to antibiotics being given     Status: None (Preliminary result)   Collection Time: 05/28/14  6:22 AM  Result Value Ref Range Status   Specimen Description BLOOD RIGHT FOREARM  Final   Special Requests BOTTLES DRAWN AEROBIC AND ANAEROBIC 5CC  Final   Culture   Final           BLOOD CULTURE RECEIVED NO GROWTH TO DATE CULTURE WILL BE HELD FOR 5 DAYS BEFORE ISSUING A FINAL NEGATIVE REPORT Performed at [facility]    Report Status PENDING  Incomplete  Culture, blood (routine x 2) Call MD if unable to obtain prior to antibiotics being given     Status: None (Preliminary result)   Collection Time: 05/28/14  6:22 AM  Result Value Ref Range Status   Specimen Description BLOOD RIGHT ANTECUBITAL  Final   Special Requests BOTTLES DRAWN AEROBIC AND ANAEROBIC 5CC  Final   Culture   Final           BLOOD CULTURE RECEIVED NO GROWTH TO DATE CULTURE WILL BE HELD FOR 5 DAYS BEFORE ISSUING A FINAL NEGATIVE REPORT Performed at [facility]    Report Status PENDING  Incomplete  Culture, sputum-assessment     Status: None   Collection Time: 05/28/14 11:13 PM  Result Value Ref Range Status   Specimen Description SPUTUM  Final   Special Requests NONE  Final   Sputum evaluation   Final    THIS SPECIMEN IS ACCEPTABLE. RESPIRATORY CULTURE REPORT TO FOLLOW.   Report Status 05/29/2014 FINAL  Final  Culture, respiratory (NON-Expectorated)     Status: None   Collection Time: 05/28/14 11:13 PM  Result Value Ref Range Status   Specimen Description SPUTUM  Final   Special Requests NONE  Final   Gram Stain   Final    FEW WBC PRESENT,BOTH PMN AND  MONONUCLEAR RARE SQUAMOUS EPITHELIAL CELLS PRESENT NO ORGANISMS SEEN Performed at [facility]    Culture   Final    NORMAL OROPHARYNGEAL FLORA Performed at [facility]    Report Status 05/31/2014 FINAL  Final     Studies: No results found.  Scheduled Meds: . aspirin  81 mg Oral Daily  . azithromycin  500 mg Intravenous Q24H  . cefTRIAXone (ROCEPHIN)  IV  1 g Intravenous Q24H  . cholecalciferol  5,000 Units Oral Daily  . citalopram  20 mg Oral Daily  . famotidine (PEPCID) IV  20 mg Intravenous Q12H  . fluticasone  2 puff Inhalation BID  . glipiZIDE  10 mg Oral BID WC  . guaiFENesin  600 mg Oral BID  . heparin  5,000 Units Subcutaneous 3 times per day  .  losartan  50 mg Oral Daily   And  . hydrochlorothiazide  12.5 mg Oral Daily  . insulin aspart  0-20 Units Subcutaneous TID WC  . insulin aspart  0-5 Units Subcutaneous QHS  . insulin aspart  2 Units Subcutaneous TID WC  . insulin NPH Human  25 Units Subcutaneous BID AC & HS  . ipratropium  0.5 mg Nebulization TID  . loratadine  10 mg Oral Daily  . magnesium oxide  200 mg Oral Daily  . methylPREDNISolone (SOLU-MEDROL) injection  60 mg Intravenous Q12H  . pantoprazole  80 mg Oral Daily  . potassium chloride  40 mEq Oral Once   Continuous Infusions:   Principal Problem:   CAP (community acquired pneumonia) Active Problems:   DM2 (diabetes mellitus, type 2)   Cough   Stridor   Elevated troponin  Time spent: 82min  Lorelai Huyser  [facility] Pager [phone].   If 7PM-7AM, please contact night-coverage at [email or web address], password [facility] 06/01/2014, 9:17 AM  LOS: 4 days

## 2014-06-02 DIAGNOSIS — J383 Other diseases of vocal cords: Secondary | ICD-10-CM

## 2014-06-02 DIAGNOSIS — J189 Pneumonia, unspecified organism: Principal | ICD-10-CM

## 2014-06-02 DIAGNOSIS — R05 Cough: Secondary | ICD-10-CM

## 2014-06-02 LAB — BASIC METABOLIC PANEL
Anion gap: 11 (ref 5–15)
BUN: 27 mg/dL — ABNORMAL HIGH (ref 6–23)
CO2: 25 mmol/L (ref 19–32)
Calcium: 8.3 mg/dL — ABNORMAL LOW (ref 8.4–10.5)
Chloride: 104 mEq/L (ref 96–112)
Creatinine, Ser: 0.8 mg/dL (ref 0.50–1.10)
GFR calc Af Amer: 82 mL/min — ABNORMAL LOW (ref >90)
GFR, EST NON AFRICAN AMERICAN: 70 mL/min — AB (ref >90)
Glucose, Bld: 208 mg/dL — ABNORMAL HIGH (ref 70–99)
Potassium: 3.5 mmol/L (ref 3.5–5.1)
SODIUM: 140 mmol/L (ref 135–145)

## 2014-06-02 LAB — GLUCOSE, CAPILLARY
GLUCOSE-CAPILLARY: 233 mg/dL — AB (ref 70–99)
GLUCOSE-CAPILLARY: 116 mg/dL — AB (ref 70–99)
GLUCOSE-CAPILLARY: 200 mg/dL — AB (ref 70–99)
Glucose-Capillary: 134 mg/dL — ABNORMAL HIGH (ref 70–99)

## 2014-06-02 MED ORDER — POTASSIUM CHLORIDE CRYS ER 20 MEQ PO TBCR
40.0000 meq | EXTENDED_RELEASE_TABLET | Freq: Once | ORAL | Status: AC
  Administered 2014-06-02: 40 meq via ORAL
  Filled 2014-06-02: qty 2

## 2014-06-02 MED ORDER — FAMOTIDINE 20 MG PO TABS
20.0000 mg | ORAL_TABLET | Freq: Two times a day (BID) | ORAL | Status: DC
  Administered 2014-06-02 – 2014-06-03 (×3): 20 mg via ORAL
  Filled 2014-06-02 (×4): qty 1

## 2014-06-02 MED ORDER — HYDROCOD POLST-CHLORPHEN POLST 10-8 MG/5ML PO LQCR
5.0000 mL | Freq: Two times a day (BID) | ORAL | Status: DC
  Administered 2014-06-02 – 2014-06-03 (×2): 5 mL via ORAL
  Filled 2014-06-02 (×2): qty 5

## 2014-06-02 MED ORDER — AZITHROMYCIN 500 MG PO TABS
500.0000 mg | ORAL_TABLET | Freq: Every day | ORAL | Status: DC
  Filled 2014-06-02: qty 1

## 2014-06-02 NOTE — Consult Note (Signed)
Dictation #:  [id number]

## 2014-06-02 NOTE — Progress Notes (Signed)
[facility] PROGRESS NOTE  [name]:[id number] DOB: [birth year] DOA: 05/28/2014 PCP: [name], MD  Brief HPI 74 year old Caucasian female admitted with shortness of breath, cough. She has a history of vocal cord dysfunction. She was admitted for community-acquired pneumonia and wheezing. She is very slow to improve.  Assessment/Plan:  Community-acquired pneumonia (CAP) Very slow to improve due to concomitant wheezing. Patient will be continued on ceftriaxone and azithromycin. She is afebrile. Continue Mucinex. Robitussin as needed.   Refractory Wheezing Most likely due to vocal cord dysfunction and above. Patient doesn't feel much better. Continue when necessary racemic epinephrine nebulizers. Continue inhaled steroids. Pt has a documented allergy to albuterol. She is on Atrovent as well. Continue steroids at current dose for now. She is very slow to respond. Wheezing hasn't improved much. At this time, will get pulmonology opinion. Discussed with Dr. [name] who will see the patient today.  DM2 with hyperglycemia CBG was very high, presumably due to steroids. Her long-acting insulin was increased. CBGs should improve as steroid dose is reduced. Continue resistant coverage. HbA1c is 8.8.  Essential hypertension  BP stable. Cont losartan/hctz as tolerated. Replace potassium  Elevated troponin Pt is asymptomatic and denies chest pain or nausea. Troponin was only minimally elevated and could be due to her respiratory process. Echocardiogram does not show any wall motion abnormalities. Normal EF was noted. No further workup at this time. Continue aspirin. Repeat EKG is unremarkable.   DVT prophylaxis: Heparin subq Code Status: Full code Family Communication: Discussed with patient Disposition Plan: Await improvement. Mobilize. Pulmonology to see.  Consultants:  None  Procedures: 2 D ECHO Study Conclusions - Left ventricle: The cavity size was normal. Wall thickness  wasincreased in a pattern of mild LVH. Systolic function was normal.The estimated ejection fraction was in the range of 60% to 65%.Wall motion was normal; there were no regional wall motionabnormalities. Doppler parameters are consistent with abnormalleft ventricular relaxation (grade 1 diastolic dysfunction). - Left atrium: The atrium was mildly dilated.  Antibiotics:  Rocephin 12/28>>>  Azithromycin 12/28>>>   Subjective: Patient frustrated that she's not feeling much better. Continues to have a cough and wheeze. Is able to ambulate but feels weaker afterwards.   Objective: Filed Vitals:   06/01/14 1515 06/01/14 2145 06/02/14 0605 06/02/14 0824  BP:  161/66 144/60   Pulse:  70 60   Temp:  98.7 F (37.1 C) 98.8 F (37.1 C)   TempSrc:  Oral Oral   Resp:  18 18   Height:      Weight:      SpO2: 94% 94% 99% 95%    Intake/Output Summary (Last 24 hours) at 06/02/14 0924 Last data filed at 06/02/14 0600  Gross per 24 hour  Intake   3110 ml  Output   2200 ml  Net    910 ml   Filed Weights   05/28/14 1148  Weight: 83.008 kg (183 lb)    Exam:   General:  Awake, in nad  Cardiovascular: regular, s1, s2  Respiratory: End-expiratory wheezing bilaterally. No rhonchi. Few crackles at the bases bilaterally. Improved air entry bilaterally.  Abdomen: soft, obese, nondistended. Nontender.  Musculoskeletal: No edema.  Data Reviewed: Basic Metabolic Panel:  Recent Labs Lab 05/28/14 0246 05/30/14 0208 06/01/14 0525 06/02/14 0514  NA 136 134* 140 140  K 4.0 4.0 3.4* 3.5  CL 104 102 104 104  CO2 25 25 26 25   GLUCOSE 276* 412* 187* 208*  BUN 17 24* 29* 27*  CREATININE 0.83 0.88 0.79 0.80  CALCIUM 8.5 8.7 8.4 8.3*   Liver Function Tests:  Recent Labs Lab 05/30/14 0208  AST 23  ALT 18  ALKPHOS 83  BILITOT 0.4  PROT 7.0  ALBUMIN 3.4*   CBC:  Recent Labs Lab 05/28/14 0246 05/30/14 0208 06/01/14 0525  WBC 5.8 4.1 9.8  NEUTROABS 4.0  --   --   HGB  11.5* 11.3* 10.7*  HCT 38.1 37.5 34.7*  MCV 84.7 84.5 83.2  PLT 276 234 277   Cardiac Enzymes:  Recent Labs Lab 05/29/14 0140 05/29/14 0635 05/29/14 1340 05/29/14 2000 05/30/14 0208  TROPONINI 0.06* 0.14* 0.07* 0.04* <0.03   CBG:  Recent Labs Lab 06/01/14 0828 06/01/14 1158 06/01/14 1657 06/01/14 2147 06/02/14 0708  GLUCAP 196* 269* 89 207* 233*    Recent Results (from the past 240 hour(s))  Culture, blood (routine x 2) Call MD if unable to obtain prior to antibiotics being given     Status: None (Preliminary result)   Collection Time: 05/28/14  6:22 AM  Result Value Ref Range Status   Specimen Description BLOOD RIGHT FOREARM  Final   Special Requests BOTTLES DRAWN AEROBIC AND ANAEROBIC 5CC  Final   Culture   Final           BLOOD CULTURE RECEIVED NO GROWTH TO DATE CULTURE WILL BE HELD FOR 5 DAYS BEFORE ISSUING A FINAL NEGATIVE REPORT Performed at [facility]    Report Status PENDING  Incomplete  Culture, blood (routine x 2) Call MD if unable to obtain prior to antibiotics being given     Status: None (Preliminary result)   Collection Time: 05/28/14  6:22 AM  Result Value Ref Range Status   Specimen Description BLOOD RIGHT ANTECUBITAL  Final   Special Requests BOTTLES DRAWN AEROBIC AND ANAEROBIC 5CC  Final   Culture   Final           BLOOD CULTURE RECEIVED NO GROWTH TO DATE CULTURE WILL BE HELD FOR 5 DAYS BEFORE ISSUING A FINAL NEGATIVE REPORT Performed at [facility]    Report Status PENDING  Incomplete  Culture, sputum-assessment     Status: None   Collection Time: 05/28/14 11:13 PM  Result Value Ref Range Status   Specimen Description SPUTUM  Final   Special Requests NONE  Final   Sputum evaluation   Final    THIS SPECIMEN IS ACCEPTABLE. RESPIRATORY CULTURE REPORT TO FOLLOW.   Report Status 05/29/2014 FINAL  Final  Culture, respiratory (NON-Expectorated)     Status: None   Collection Time: 05/28/14 11:13 PM  Result Value Ref Range Status    Specimen Description SPUTUM  Final   Special Requests NONE  Final   Gram Stain   Final    FEW WBC PRESENT,BOTH PMN AND MONONUCLEAR RARE SQUAMOUS EPITHELIAL CELLS PRESENT NO ORGANISMS SEEN Performed at [facility]    Culture   Final    NORMAL OROPHARYNGEAL FLORA Performed at [facility]    Report Status 05/31/2014 FINAL  Final     Studies: No results found.  Scheduled Meds: . aspirin  81 mg Oral Daily  . [START ON 06/03/2014] azithromycin  500 mg Oral Daily  . cefTRIAXone (ROCEPHIN)  IV  1 g Intravenous Q24H  . cholecalciferol  5,000 Units Oral Daily  . citalopram  20 mg Oral Daily  . famotidine  20 mg Oral BID  . fluticasone  2 puff Inhalation BID  . glipiZIDE  10 mg  Oral BID WC  . guaiFENesin  600 mg Oral BID  . heparin  5,000 Units Subcutaneous 3 times per day  . losartan  50 mg Oral Daily   And  . hydrochlorothiazide  12.5 mg Oral Daily  . insulin aspart  0-20 Units Subcutaneous TID WC  . insulin aspart  0-5 Units Subcutaneous QHS  . insulin aspart  2 Units Subcutaneous TID WC  . insulin NPH Human  25 Units Subcutaneous BID AC & HS  . ipratropium  0.5 mg Nebulization TID  . loratadine  10 mg Oral Daily  . magnesium oxide  200 mg Oral Daily  . methylPREDNISolone (SOLU-MEDROL) injection  60 mg Intravenous Q12H  . pantoprazole  80 mg Oral Daily  . potassium chloride  40 mEq Oral Once   Continuous Infusions:   Principal Problem:   CAP (community acquired pneumonia) Active Problems:   DM2 (diabetes mellitus, type 2)   Cough   Stridor   Elevated troponin  Time spent: 57min  Zachry Hopfensperger  [facility] Pager [phone].   If 7PM-7AM, please contact night-coverage at [email or web address], password [facility] 06/02/2014, 9:24 AM  LOS: 5 days

## 2014-06-02 NOTE — Consult Note (Signed)
[name], ALCON NO.:  [id number]  MEDICAL RECORD NO.:  [id number]  LOCATION:  0086                         FACILITY:  [facility]  PHYSICIAN:  Kathee Delton, MD,FCCPDATE OF BIRTH:  11[birth year]11[birth year]1938  DATE OF CONSULTATION:  06/02/2014 DATE OF DISCHARGE:                                CONSULTATION   REFERRING PHYSICIAN:  [facility].  HISTORY OF PRESENT ILLNESS:  The patient is a 74 year old female, well- known to our practice with a history of chronic cough and vocal cord dysfunction.  She was recently admitted to the hospital with what appeared to be a left lower lobe infiltrate, and she did have a clinical history consistent with pneumonia.  She has been treated with antibiotics, and although, she feels better, she is continued to have a chronic cough and upper airway wheezing.  It should be noted that she does not have a history of asthma, and pulmonary function studies in the past had been totally normal.  The patient states that her cough and upper airway dysfunction had been doing fairly well with treatment of reflux and postnasal drip leading up to her episode of pneumonia.  PAST MEDICAL HISTORY:  Significant for: 1. Known vocal cord dysfunction with upper airway cough syndrome. 2. Hypertension. 3. Diabetes. 4. History of reflux with esophageal dysmotility issue. 5. History of gout.  ALLERGIES:  THE PATIENT IS ALLERGIC TO CIPRO, ADVAIR, ALBUTEROL, ALENDRONATE, BENADRYL, BENZONATATE, CEPHALEXIN, GABAPENTIN, IOHEXOL, KEFLEX, MORPHINE, DECONGESTANTS, PROPOXYPHENE, RECLAST, AVAPRO, CODEINE, TESSALON PERLES, AND TRAMADOL.  SOCIAL HISTORY:  The patient has never smoked and does not drink alcohol or use illicit drugs.  FAMILY HISTORY:  Remarkable for CVA, diabetes, heart disease, and cancer.  REVIEW OF SYSTEMS:  A 10-point review of systems was unremarkable except for that listed in the history of present illness.  PHYSICAL EXAMINATION:  GENERAL:   She is an obese female in no acute distress. VITAL SIGNS:  Blood pressure 147/55, pulse is 60 and regular, respiratory rate is 18.  She is afebrile. HEENT:  Pupils are equal, round, reactive to light to light and accommodation.  Extraocular muscles are intact.  Nares are patent without discharge.  Oropharynx is clear. NECK:  Supple without JVD or lymphadenopathy.  There is no palpable thyromegaly. CHEST:  Totally clear to auscultation with no wheezes or rhonchi, but there is occasional upper airway pseudo-wheezing. CARDIAC:  Regular rate and rhythm. ABDOMEN:  Soft, nontender, nondistended with good bowel sounds. GENITAL/RECTAL/BREASTS:  Not done and not indicated. EXTREMITIES:  Lower extremities with mild edema, no cyanosis. NEUROLOGIC:  She is alert and oriented, moves all four extremities.  IMPRESSION: 1. Probable left lower lobe pneumonia that appears to be responding     well clinically to antibiotics.  At some point, she needs to be     changed over to oral antibiotics for discharge home. 2. Chronic cough with history of vocal cord dysfunction and upper     airway cough syndrome.  The patient had been doing extremely well,     however, this has flared because of her pneumonia.  At this point,     we will need to eliminate any irritation and stimulation to  the     upper airway, and we therefore discontinued all of her inhalers and     nebulized medications.  It should be noted that she does not have     asthma.  I have also reviewed with her the behavioral therapies for     chronic cough, including no throat clearing and using hard candy to     abate the back of the throat and eliminate the tickle.     Unfortunately, she is allergic or intolerant to gabapentin and     tramadol, both meds that are very successful for decreasing upper     airway irritability.  I think she may benefit from referral to the     [place] at [facility] for speech therapy      intervention and upper airway evaluation by Otolaryngology.  This     can certainly be done as an outpatient.  SUGGESTIONS: 1. Discontinue all inhaled bronchodilators as well as all nebulized     meds. 2. Encourage the patient to avoid throat clearing, and to use hard     candy to abate the back of her throat from the time she awakens     until she goes to bed. 3. Continue to work on aggressive cough suppression with Tussionex.  I     would have her take this on a scheduled basis for at least a week     after discharge. 4. Change steroids to oral route and taper over the next 5-7 days. 5. Continue to treat reflux aggressively with b.i.d. proton pump     inhibitor, and possibly add an H2 blocker at bedtime. 6. Would have the patient follow up with Dr. [name] as an outpatient and     he can decide about referral to the [place] at     [place], MD,FCCP     [facility]/MEDQ  D:  06/02/2014  T:  06/02/2014  Job:  [id number]

## 2014-06-03 LAB — CULTURE, BLOOD (ROUTINE X 2)
Culture: NO GROWTH
Culture: NO GROWTH

## 2014-06-03 LAB — GLUCOSE, CAPILLARY: GLUCOSE-CAPILLARY: 248 mg/dL — AB (ref 70–99)

## 2014-06-03 MED ORDER — HYDROCOD POLST-CHLORPHEN POLST 10-8 MG/5ML PO LQCR: 5.0000 mL | Freq: Two times a day (BID) | ORAL | Status: DC

## 2014-06-03 MED ORDER — PREDNISONE 10 MG PO TABS: ORAL_TABLET | ORAL | Status: DC

## 2014-06-03 MED ORDER — FAMOTIDINE 20 MG PO TABS: 20.0000 mg | ORAL_TABLET | Freq: Every day | ORAL | Status: DC

## 2014-06-03 MED ORDER — AMOXICILLIN 500 MG PO CAPS: 500.0000 mg | ORAL_CAPSULE | Freq: Three times a day (TID) | ORAL | Status: DC

## 2014-06-03 NOTE — Discharge Summary (Addendum)
[facility]  Physician Discharge Summary   Patient ID: [name] MRN: [id number] DOB/AGE: [birth year] 74 y.o.  Admit date: 05/28/2014 Discharge date: 06/03/2014  PCP: [name], MD  DISCHARGE DIAGNOSES:  Principal Problem:   CAP (community acquired pneumonia) Active Problems:   DM2 (diabetes mellitus, type 2)   Cough   Stridor   Elevated troponin   RECOMMENDATIONS FOR OUTPATIENT FOLLOW UP: 1. Needs follow up with Dr. [name] (Pulmonology)  DISCHARGE CONDITION: good  Diet recommendation: Mod Carb  Filed Weights   05/28/14 1148  Weight: 83.008 kg (183 lb)    INITIAL HISTORY: 74 year old Caucasian female admitted with shortness of breath, cough. She has a history of vocal cord dysfunction. She was admitted for community-acquired pneumonia and wheezing.   Consultations:  Pulmonology  Procedures: 2 D ECHO Study Conclusions - Left ventricle: The cavity size was normal. Wall thickness wasincreased in a pattern of mild LVH. Systolic function was normal.The estimated ejection fraction was in the range of 60% to 65%.Wall motion was normal; there were no regional wall motionabnormalities. Doppler parameters are consistent with abnormalleft ventricular relaxation (grade 1 diastolic dysfunction). - Left atrium: The atrium was mildly dilated.  HOSPITAL COURSE:   Community-acquired pneumonia (CAP) She was initiated on antibiotics. She was very slow to improve due to concomitant wheezing. She reports intolerance to numerous oral antibiotics and is not willing to try. She states she has tolerated Amox. She has completed about 6 days of treatment. Will discharge on Amox.   Refractory Wheezing She was very slow to respond. We consulted pulmonology to assist with management. Dr. [name] felt that her symptoms were all due to upper airway pathology and vocal cord dysfunction. He recommended stopping all her inhalers. This was done yesterday and patient was much better  today. She feels that she is ready to go home. She should continue PPI. Pepcid added at nighttime. Tussionex for cough. She was given PRN racemic epinephrine nebulizers in the hospital. Quick steroid taper will be prescribed. Pulm to follow as OP and consider referral to [place] at [facility].   DM2 with hyperglycemia CBG was very high, presumably due to steroids. Her long-acting insulin was increased. CBGs should improve as steroid dose is reduced. HbA1c is 8.8.  Essential hypertension  BP stable. Cont losartan/hctz as tolerated. Replaced potassium  Elevated troponin Pt is asymptomatic and denied chest pain or nausea. Troponin was only minimally elevated and likely due to her respiratory process. Echocardiogram does not show any wall motion abnormalities. Normal EF was noted. No further workup at this time. Continue aspirin. Repeat EKG is unremarkable.   Patient stable for discharge.  PERTINENT LABS:  The results of significant diagnostics from this hospitalization (including imaging, microbiology, ancillary and laboratory) are listed below for reference.    Microbiology: Recent Results (from the past 240 hour(s))  Culture, blood (routine x 2) Call MD if unable to obtain prior to antibiotics being given     Status: None (Preliminary result)   Collection Time: 05/28/14  6:22 AM  Result Value Ref Range Status   Specimen Description BLOOD RIGHT FOREARM  Final   Special Requests BOTTLES DRAWN AEROBIC AND ANAEROBIC 5CC  Final   Culture   Final           BLOOD CULTURE RECEIVED NO GROWTH TO DATE CULTURE WILL BE HELD FOR 5 DAYS BEFORE ISSUING A FINAL NEGATIVE REPORT Performed at [facility]    Report Status PENDING  Incomplete  Culture, blood (  routine x 2) Call MD if unable to obtain prior to antibiotics being given     Status: None (Preliminary result)   Collection Time: 05/28/14  6:22 AM  Result Value Ref Range Status   Specimen Description BLOOD RIGHT  ANTECUBITAL  Final   Special Requests BOTTLES DRAWN AEROBIC AND ANAEROBIC 5CC  Final   Culture   Final           BLOOD CULTURE RECEIVED NO GROWTH TO DATE CULTURE WILL BE HELD FOR 5 DAYS BEFORE ISSUING A FINAL NEGATIVE REPORT Performed at [facility]    Report Status PENDING  Incomplete  Culture, sputum-assessment     Status: None   Collection Time: 05/28/14 11:13 PM  Result Value Ref Range Status   Specimen Description SPUTUM  Final   Special Requests NONE  Final   Sputum evaluation   Final    THIS SPECIMEN IS ACCEPTABLE. RESPIRATORY CULTURE REPORT TO FOLLOW.   Report Status 05/29/2014 FINAL  Final  Culture, respiratory (NON-Expectorated)     Status: None   Collection Time: 05/28/14 11:13 PM  Result Value Ref Range Status   Specimen Description SPUTUM  Final   Special Requests NONE  Final   Gram Stain   Final    FEW WBC PRESENT,BOTH PMN AND MONONUCLEAR RARE SQUAMOUS EPITHELIAL CELLS PRESENT NO ORGANISMS SEEN Performed at [facility]    Culture   Final    NORMAL OROPHARYNGEAL FLORA Performed at [facility]    Report Status 05/31/2014 FINAL  Final     Labs: Basic Metabolic Panel:  Recent Labs Lab 05/28/14 0246 05/30/14 0208 06/01/14 0525 06/02/14 0514  NA 136 134* 140 140  K 4.0 4.0 3.4* 3.5  CL 104 102 104 104  CO2 25 25 26 25   GLUCOSE 276* 412* 187* 208*  BUN 17 24* 29* 27*  CREATININE 0.83 0.88 0.79 0.80  CALCIUM 8.5 8.7 8.4 8.3*   Liver Function Tests:  Recent Labs Lab 05/30/14 0208  AST 23  ALT 18  ALKPHOS 83  BILITOT 0.4  PROT 7.0  ALBUMIN 3.4*   CBC:  Recent Labs Lab 05/28/14 0246 05/30/14 0208 06/01/14 0525  WBC 5.8 4.1 9.8  NEUTROABS 4.0  --   --   HGB 11.5* 11.3* 10.7*  HCT 38.1 37.5 34.7*  MCV 84.7 84.5 83.2  PLT 276 234 277   Cardiac Enzymes:  Recent Labs Lab 05/29/14 0140 05/29/14 0635 05/29/14 1340 05/29/14 2000 05/30/14 0208  TROPONINI 0.06* 0.14* 0.07* 0.04* <0.03   CBG:  Recent  Labs Lab 06/02/14 0708 06/02/14 1235 06/02/14 1655 06/02/14 2133 06/03/14 0737  GLUCAP 233* 134* 116* 200* 248*     IMAGING STUDIES Dg Chest 2 View  05/28/2014   CLINICAL DATA:  Productive cough  EXAM: CHEST  2 VIEW  COMPARISON:  04/17/2013  FINDINGS: Best visualized in the lateral projection, there is a lower lobe opacity increasing the density of the lower lumbar spine. This is likely on the left. Normal heart size and mediastinal contours. No pleural effusion or pneumothorax.  IMPRESSION: Left lower lobe pneumonia. Recommend followup chest x-ray 6 weeks after treatment.   Electronically Signed   By: Jorje Guild M.D.   On: 05/28/2014 03:21    DISCHARGE EXAMINATION: Filed Vitals:   06/02/14 1015 06/02/14 1346 06/02/14 2140 06/03/14 0540  BP: 138/60 147/55 161/77 148/54  Pulse: 60 52 65 55  Temp:  98 F (36.7 C) 97.4 F (36.3 C) 98.2 F (36.8 C)  TempSrc:  Oral Oral Oral  Resp:  18 18 18   Height:      Weight:      SpO2:  98% 98% 95%   General appearance: alert, cooperative, appears stated age and no distress Resp: improved air entry with minimal wheezing. Cardio: regular rate and rhythm, S1, S2 normal, no murmur, click, rub or gallop GI: soft, non-tender; bowel sounds normal; no masses,  no organomegaly  DISPOSITION: Home  Discharge Instructions    Call MD for:  difficulty breathing, headache or visual disturbances    Complete by:  As directed      Call MD for:  extreme fatigue    Complete by:  As directed      Call MD for:  persistant dizziness or light-headedness    Complete by:  As directed      Call MD for:  persistant nausea and vomiting    Complete by:  As directed      Call MD for:  severe uncontrolled pain    Complete by:  As directed      Diet Carb Modified    Complete by:  As directed      Discharge instructions    Complete by:  As directed   Please call Dr. Bari Mantis office to schedule appointment.     Increase activity slowly    Complete by:  As  directed            ALLERGIES:  Allergies  Allergen Reactions  . Ciprofloxacin Rash  . Advair Diskus [Fluticasone-Salmeterol] Other (See Comments)    Throat closes  . Albuterol     REACTION: closes throat  . Alendronate Sodium Itching  . Benadryl [Diphenhydramine Hcl]   . Benzonatate     REACTION: rash/hives  . Cephalexin Nausea Only  . Gabapentin Other (See Comments)    Loss of memory  . Iohexol      Desc: rash and DIF BREATHING   . Keflex [Cephalexin]   . Morphine Nausea And Vomiting  . Other     Decongestants  . Propoxyphene Hcl   . Reclast [Zoledronic Acid] Other (See Comments)    Chest pain  . Avapro [Irbesartan] Rash  . Codeine Nausea And Vomiting, Swelling and Rash  . Tessalon Perles Rash  . Tramadol Nausea Only and Rash     Discharge Medication List as of 06/03/2014 10:39 AM    START taking these medications   Details  amoxicillin (AMOXIL) 500 MG capsule Take 1 capsule (500 mg total) by mouth 3 (three) times daily. For 4 more days., Starting 06/03/2014, Until Discontinued, Print    famotidine (PEPCID) 20 MG tablet Take 1 tablet (20 mg total) by mouth at bedtime., Starting 06/03/2014, Until Discontinued, Print    predniSONE (DELTASONE) 10 MG tablet Take 4 tablets twice daily for 3 days, then take 4 tablets once daily for 3 days, then take 2 tablets once daily for 3 days, then STOP., Print      CONTINUE these medications which have CHANGED   Details  chlorpheniramine-HYDROcodone (TUSSIONEX) 10-8 MG/5ML LQCR Take 5 mLs by mouth every 12 (twelve) hours. Take scheduled for 1 week and then as needed for cough., Starting 06/03/2014, Until Discontinued, Print      CONTINUE these medications which have NOT CHANGED   Details  aspirin 81 MG tablet Take 81 mg by mouth daily. , Until Discontinued, Historical Med    Cholecalciferol (VITAMIN D3) 5000 UNITS CAPS Take 1 tablet by mouth daily. For supplement, Until Discontinued,  Historical Med    citalopram (CELEXA) 40 MG  tablet Take 1 tablet (40 mg total) by mouth daily., Starting 12/06/2012, Until Discontinued, Print    furosemide (LASIX) 40 MG tablet One each morning to treat edema, Normal    glipiZIDE (GLUCOTROL XL) 10 MG 24 hr tablet Take 1 tablet (10 mg total) by mouth 2 (two) times daily. Take in the morning and evening to control blood sugar, Starting 12/06/2012, Until Discontinued, Print    insulin NPH (HUMULIN N,NOVOLIN N) 100 UNIT/ML injection Inject 20-25 Units into the skin as directed. 20 units in the morning and 25 units in the evening to control diabetes., Starting 12/06/2012, Until Discontinued, Historical Med    insulin regular (HUMULIN R) 100 units/mL injection Inject 2-5 Units into the skin 3 (three) times daily before meals. Per Sliding scale- as needed, Until Discontinued, Historical Med    losartan-hydrochlorothiazide (HYZAAR) 50-12.5 MG per tablet TAKE 1 TABLET BY MOUTH EVERY DAY TO CONTROL BLOOD PRESSURE, Normal    PRILOSEC 40 MG capsule TAKE 1 CAPSULE TWICE DAILY FOR ACID REFLUX, Normal    cetirizine (ZYRTEC) 10 MG tablet Take 10 mg by mouth daily. For allergies, Until Discontinued, Historical Med    Magnesium Oxide 250 MG TABS Take 250 mg by mouth daily. , Until Discontinued, Historical Med    nitroGLYCERIN (NITROSTAT) 0.4 MG SL tablet Place 1 tablet (0.4 mg total) under the tongue every 5 (five) minutes as needed. For chest pain. Maximum of 3 tablets in 15 minutes., Starting 12/06/2012, Until Discontinued, Print      STOP taking these medications     fluticasone (FLONASE) 50 MCG/ACT nasal spray        Follow-up Information    Follow up with GREEN, Viviann Spare, MD. Schedule an appointment as soon as possible for a visit in 1 week.   Specialty:  Internal Medicine   Why:  post hospitalization follow up   Contact information:   [place] [address] [phone]       Follow up with Rigoberto Noel., MD.   Specialty:  Pulmonary Disease   Why:  for wheezing/vocal cord  dysfunction.   Contact information:   [address] [address] [phone]       TOTAL DISCHARGE TIME: 35 mins.  [facility] Pager [phone]  06/03/2014, 2:21 PM

## 2014-06-03 NOTE — Discharge Instructions (Signed)

## 2014-06-07 ENCOUNTER — Telehealth: Payer: Self-pay | Admitting: Adult Health

## 2014-06-07 NOTE — Telephone Encounter (Signed)
Pt calling to check on status of refill for antibiotic would like it filled today berfor it gets dark b/c husband can't drive @nite .[name]

## 2014-06-07 NOTE — Telephone Encounter (Signed)
PT d/c from hospital seen for CAP.  Has f/u with TP on 06/11/14.  Pt was discharged on Amoxicillin and finished it today but still c/o prod cough (yellow- brown plugs), sob (same), some chest tightness and occas wheezing.  Pt want a refill on Amoxicillin due to still having symptoms and afraid she will get real sick again.  Please advise.

## 2014-06-07 NOTE — Telephone Encounter (Signed)
I have informed nurse for TP of message below; pt is aware TP is working on this and will call her back soon.

## 2014-06-07 NOTE — Telephone Encounter (Signed)
Per TP: We would prefer not to refill the abx; will discuss with [facility] tomorrow on return since he saw her in the hosp.  In the meantime, would take take mucinex dm twice daily as needed for cough/congestion.  Thanks.  Called spoke with patient and discussed the above with her.  Pt okay with waiting for [facility] recs tomorrow morning.  She does have enough amoxicillin for this evening but will be out beginning tomorrow.  Did advise pt on Mucinex DM twice daily - she is unable to get this today but will purchase tomorrow.  Dr [name], please advise on refilling pt's abx. She was last seen in the office in 2010 but you consulted on her in the hosp She is scheduled to see TP on 1.11.16

## 2014-06-08 NOTE — Telephone Encounter (Signed)
I would stay off further abx.  Also, this is Dr. Bari Mantis pt, and should be changed to his schedule after she sees TP.  Thanks.

## 2014-06-08 NOTE — Telephone Encounter (Signed)
Pt is aware of [facility]'s recommendations.

## 2014-06-11 ENCOUNTER — Encounter: Payer: Self-pay | Admitting: Adult Health

## 2014-06-11 ENCOUNTER — Ambulatory Visit (INDEPENDENT_AMBULATORY_CARE_PROVIDER_SITE_OTHER): Payer: Medicare Other | Admitting: Adult Health

## 2014-06-11 ENCOUNTER — Ambulatory Visit (INDEPENDENT_AMBULATORY_CARE_PROVIDER_SITE_OTHER)
Admission: RE | Admit: 2014-06-11 | Discharge: 2014-06-11 | Disposition: A | Discharge status: Home / Self Care | Payer: Medicare Other | Source: Ambulatory Visit | Attending: Adult Health | Admitting: Adult Health

## 2014-06-11 VITALS — BP 112/66 | HR 70 | Temp 98.1°F | Ht 62.0 in | Wt 188.0 lb

## 2014-06-11 DIAGNOSIS — J189 Pneumonia, unspecified organism: Secondary | ICD-10-CM

## 2014-06-11 DIAGNOSIS — R0989 Other specified symptoms and signs involving the circulatory and respiratory systems: Secondary | ICD-10-CM | POA: Diagnosis not present

## 2014-06-11 DIAGNOSIS — R059 Cough, unspecified: Secondary | ICD-10-CM

## 2014-06-11 DIAGNOSIS — R05 Cough: Secondary | ICD-10-CM

## 2014-06-11 NOTE — Patient Instructions (Signed)
Continue on current regimen.  Add Delsym 2 tsp Twice daily   Check chest xray today  Follow up with Dr. [name]  In 6 weeks and As needed   Please contact office for sooner follow up if symptoms do not improve or worsen or seek emergency care

## 2014-06-11 NOTE — Addendum Note (Signed)
Addended by: Parke Poisson E on: 06/11/2014 03:27 PM   Modules accepted: Orders

## 2014-06-11 NOTE — Assessment & Plan Note (Signed)
UAC /VCD  Cont tx for cough prevention and trigger control   Plan  Continue on current regimen.  Add Delsym 2 tsp Twice daily   Check chest xray today  Follow up with Dr. [name]  In 6 weeks and As needed   Please contact office for sooner follow up if symptoms do not improve or worsen or seek emergency care

## 2014-06-11 NOTE — Progress Notes (Signed)
   Subjective:    Patient ID: [name], female    DOB: 1940/09/13, 74 y.o.   MRN: [id number]  HPI 74 year old female admitted December 28 for left lower lobe pneumonia, community-acquired pneumonia. Pulmonary consult during hospitalization.  06/11/2014 [facility] follow up  Patient was admitted December 28 through January 3 for community-acquired pneumonia. Chest x-ray showed a left lower lobe infiltrate consistent with pneumonia. Patient was started on IV antibiotics, and discharged on Amoxicillin.  . During her hospitalization. Patient did have upper airway coughing and refractory wheezing. This was felt to be secondary to upper airway inflammation and focal cord dysfunction. Seen by pulmonary . She was treated for cough, prevention and trigger control. She was started on a protein pump inhibitor and Pepcid at bedtime and given Tussionex for cough. She was treated with steroids. She is a never smoker.  Since discharge she remains weak. No fever, hemoptysis, chest pain or orthopnea.  Eating ok, no n/v/d.  Still has cough and congestion .  Previous pt of Dr. [name]  , has been several years ago. Was seen for VCD .  Remains on prilosec and pepcid and zyrtec for cough triggers.      Review of Systems  Constitutional:   No  weight loss, night sweats,  Fevers, chills,  +fatigue, or  lassitude.  HEENT:   No headaches,  Difficulty swallowing,  Tooth/dental problems, or  Sore throat,                No sneezing, itching, ear ache,  +nasal congestion, post nasal drip,   CV:  No chest pain,  Orthopnea, PND, swelling in lower extremities, anasarca, dizziness, palpitations, syncope.   GI  No heartburn, indigestion, abdominal pain, nausea, vomiting, diarrhea, change in bowel habits, loss of appetite, bloody stools.   Resp:  .  No chest wall deformity  Skin: no rash or lesions.  GU: no dysuria, change in color of urine, no urgency or frequency.  No flank pain, no hematuria   MS:  No joint  pain or swelling.  No decreased range of motion.  No back pain.  Psych:  No change in mood or affect. No depression or anxiety.  No memory loss.          Objective:   Physical Exam GEN: A/Ox3; pleasant , NAD  HEENT:  [place]/AT,  EACs-clear, TMs-wnl, NOSE-clear, THROAT-clear, no lesions, no postnasal drip or exudate noted.   NECK:  Supple w/ fair ROM; no JVD; normal carotid impulses w/o bruits; no thyromegaly or nodules palpated; no lymphadenopathy.  RESP  Few scattered rhonchi, few psuedowheezing .no accessory muscle use, no dullness to percussion  CARD:  RRR, no m/r/g  , no peripheral edema, pulses intact, no cyanosis or clubbing.  GI:   Soft & nt; nml bowel sounds; no organomegaly or masses detected.  Musco: Warm bil, no deformities or joint swelling noted.   Neuro: alert, no focal deficits noted.    Skin: Warm, no lesions or rashes         Assessment & Plan:

## 2014-06-11 NOTE — Assessment & Plan Note (Signed)
LLL PNA -slowly improving  Will need CXR today  follow up Dr. [name]  In 6 weeks and As needed

## 2014-06-12 NOTE — Progress Notes (Signed)
Reviewed & agree with plan  

## 2014-06-13 ENCOUNTER — Telehealth: Payer: Self-pay | Admitting: Adult Health

## 2014-06-13 NOTE — Telephone Encounter (Signed)
Notes Recorded by Melvenia Needles, NP on 06/11/2014 at 6:11 PM PNA has resolved  Cont w/ ov recs  Please contact office for sooner follow up if symptoms do not improve or worsen or seek emergency care    Called and spoke to pt. Informed pt of the results and recs per TP. Pt verbalized understanding and denied any further questions or concerns at this time.

## 2014-06-13 NOTE — Progress Notes (Signed)
Quick Note:  Called and spoke to pt. Informed pt of the results and recs per TP. Pt verbalized understanding and denied any further questions or concerns at this time. ______ 

## 2014-07-06 DIAGNOSIS — L304 Erythema intertrigo: Secondary | ICD-10-CM | POA: Diagnosis not present

## 2014-07-06 DIAGNOSIS — L814 Other melanin hyperpigmentation: Secondary | ICD-10-CM | POA: Diagnosis not present

## 2014-07-06 DIAGNOSIS — D225 Melanocytic nevi of trunk: Secondary | ICD-10-CM | POA: Diagnosis not present

## 2014-07-06 DIAGNOSIS — L820 Inflamed seborrheic keratosis: Secondary | ICD-10-CM | POA: Diagnosis not present

## 2014-07-06 DIAGNOSIS — D1801 Hemangioma of skin and subcutaneous tissue: Secondary | ICD-10-CM | POA: Diagnosis not present

## 2014-07-06 DIAGNOSIS — L819 Disorder of pigmentation, unspecified: Secondary | ICD-10-CM | POA: Diagnosis not present

## 2014-07-06 DIAGNOSIS — L918 Other hypertrophic disorders of the skin: Secondary | ICD-10-CM | POA: Diagnosis not present

## 2014-07-06 DIAGNOSIS — L570 Actinic keratosis: Secondary | ICD-10-CM | POA: Diagnosis not present

## 2014-07-06 DIAGNOSIS — L821 Other seborrheic keratosis: Secondary | ICD-10-CM | POA: Diagnosis not present

## 2014-07-09 DIAGNOSIS — H43811 Vitreous degeneration, right eye: Secondary | ICD-10-CM | POA: Diagnosis not present

## 2014-07-09 DIAGNOSIS — E11339 Type 2 diabetes mellitus with moderate nonproliferative diabetic retinopathy without macular edema: Secondary | ICD-10-CM | POA: Diagnosis not present

## 2014-07-09 DIAGNOSIS — H35372 Puckering of macula, left eye: Secondary | ICD-10-CM | POA: Diagnosis not present

## 2014-07-09 LAB — HM DIABETES EYE EXAM

## 2014-07-10 ENCOUNTER — Encounter: Payer: Self-pay | Admitting: *Deleted

## 2014-07-13 ENCOUNTER — Encounter: Payer: Self-pay | Admitting: Internal Medicine

## 2014-07-13 ENCOUNTER — Ambulatory Visit (INDEPENDENT_AMBULATORY_CARE_PROVIDER_SITE_OTHER): Payer: Medicare Other | Admitting: Internal Medicine

## 2014-07-13 ENCOUNTER — Telehealth: Payer: Self-pay | Admitting: Internal Medicine

## 2014-07-13 VITALS — BP 134/82 | HR 92 | Ht 62.0 in | Wt 195.6 lb

## 2014-07-13 DIAGNOSIS — I10 Essential (primary) hypertension: Secondary | ICD-10-CM

## 2014-07-13 DIAGNOSIS — R05 Cough: Secondary | ICD-10-CM | POA: Diagnosis not present

## 2014-07-13 DIAGNOSIS — R058 Other specified cough: Secondary | ICD-10-CM | POA: Insufficient documentation

## 2014-07-13 MED ORDER — VALSARTAN-HYDROCHLOROTHIAZIDE 80-12.5 MG PO TABS: 1.0000 | ORAL_TABLET | Freq: Every day | ORAL | Status: DC

## 2014-07-13 NOTE — Assessment & Plan Note (Addendum)
For reasons that may related to vascular permability and nitric oxide pathways but not elevated  bradykinin levels (as seen with  ACEi use) losartan in the generic form has been reported now from mulitple sources  to cause a similar pattern of non-specific  upper airway symptoms as seen with acei.   This has not been reported with exposure to the other ARB's to date, so it seems reasonable for now to try either generic diovan or avapro if ARB needed or use an alternative class altogether.  See:  Lelon Frohlich Allergy Asthma Immunol  2008: 101: p [phone]    Try valsartan 80/12.5 one daily

## 2014-07-13 NOTE — Progress Notes (Addendum)
Subjective:    Patient ID: [name], female    DOB: 09/24/1940,    MRN: [id number]    Brief patient profile:  74 year old female admitted December 28 for left lower lobe pneumonia, community-acquired pneumonia. Pulmonary consult during hospitalization.  06/11/2014 [facility] follow up  Patient was admitted December 28 through January 3 for community-acquired pneumonia. Chest x-ray showed a left lower lobe infiltrate consistent with pneumonia. Patient was started on IV antibiotics, and discharged on Amoxicillin.   During her hospitalization. Patient did have upper airway coughing and refractory wheezing. This was felt to be secondary to upper airway inflammation and focal cord dysfunction. Seen by pulmonary . She was treated for cough, prevention and trigger control. She was started on a protein pump inhibitor and Pepcid at bedtime and given Tussionex for cough. She was treated with steroids. She is a never smoker.  Since discharge she remains weak. No fever, hemoptysis, chest pain or orthopnea.  Eating ok, no n/v/d.  Still has cough and congestion .  Previous pt of Dr. [name]  , has been several years ago. Was seen for VCD .  Remains on prilosec and pepcid and zyrtec for cough triggers.  rec Continue on current regimen.  Add Delsym 2 tsp Twice daily   Check chest xray today    07/13/2014 f/u ov/[name] re: UACS Chief Complaint  Patient presents with  . Follow-up    Pt states that her cough has almost resolved. She c/o her throat feeling very dry today and has had some PND with yellow nasal d/c- just started on amoxicillin per her dentist.    problems for decades, avg 2 x monthly x 2-3 minutes, had improved but got worse x 6 months prior to OV   Taking ppi bid p meals and cozaar and no dietary restrictions Spells no particular time of the day/also occ happen at hs esp if flat   No cough but constant sense of pnds/ throat clearing  No obvious day to day or daytime variabilty or assoc  cp or chest tightness, subjective wheeze overt sinus or hb symptoms. No unusual exp hx or h/o childhood pna/ asthma or knowledge of premature birth.  Sleeping ok without nocturnal  or early am exacerbation  of respiratory  c/o's or need for noct saba. Also denies any obvious fluctuation of symptoms with weather or environmental changes or other aggravating or alleviating factors except as outlined above   Current Medications, Allergies, Complete Past Medical History, Past Surgical History, Family History, and Social History were reviewed in [employer] record.  ROS  The following are not active complaints unless bolded sore throat, dysphagia, dental problems, itching, sneezing,  nasal congestion or excess/ purulent secretions, ear ache,   fever, chills, sweats, unintended wt loss, pleuritic or exertional cp, hemoptysis,  orthopnea pnd or leg swelling, presyncope, palpitations, heartburn, abdominal pain, anorexia, nausea, vomiting, diarrhea  or change in bowel or urinary habits, change in stools or urine, dysuria,hematuria,  rash, arthralgias, visual complaints, headache, numbness weakness or ataxia or problems with walking or coordination,  change in mood/affect or memory.            Objective:   Physical Exam GEN: A/Ox3; pleasant , NAD but freq throat clearing during interview and exam  Wt Readings from Last 3 Encounters:  07/13/14 195 lb 9.6 oz (88.724 kg)  06/11/14 188 lb (85.276 kg)  05/28/14 183 lb (83.008 kg)    Vital signs reviewed   HEENT:  [place]/AT,  EACs-clear, TMs-wnl, NOSE-clear, THROAT-pristine oropharynx no lesions, no postnasal drip or exudate noted.   NECK:  Supple w/ fair ROM; no JVD; normal carotid impulses w/o bruits; no thyromegaly or nodules palpated; no lymphadenopathy.  RESP  Few scattered rhonchi, few psuedowheezing .no accessory muscle use, no dullness to percussion  CARD:  RRR, no m/r/g  , no peripheral edema, pulses intact, no cyanosis or  clubbing.  GI:   Soft & nt; nml bowel sounds; no organomegaly or masses detected.  Musco: Warm bil, no deformities or joint swelling noted.   Neuro: alert, no focal deficits noted.    Skin: Warm, no lesions or rashes   CXR:  06/11/14  I personally reviewed images and agree with radiology impression as follows:   There is mild hyperinflation which may be voluntary or may reflect underlying COPD or reactive airway disease. There is no residual pneumonia.        Assessment & Plan:

## 2014-07-13 NOTE — Assessment & Plan Note (Signed)
Although she says she's not coughing, clearly she has excessive daytime throat clearing as did so multiple times during ov and has prev dx of vcd  The most common causes of chronic cough in immunocompetent adults include the following: upper airway cough syndrome (UACS), previously referred to as postnasal drip syndrome (PNDS), which is caused by variety of rhinosinus conditions; (2) asthma; (3) GERD; (4) chronic bronchitis from cigarette smoking or other inhaled environmental irritants; (5) nonasthmatic eosinophilic bronchitis; and (6) bronchiectasis.   These conditions, singly or in combination, have accounted for up to 94% of the causes of chronic cough in prospective studies.   Other conditions have constituted no >6% of the causes in prospective studies These have included bronchogenic carcinoma, chronic interstitial pneumonia, sarcoidosis, left ventricular failure, ACEI-induced cough, and aspiration from a condition associated with pharyngeal dysfunction.    Chronic cough is often simultaneously caused by more than one condition. A single cause has been found from 38 to 82% of the time, multiple causes from 18 to 62%. Multiply caused cough has been the result of three diseases up to 42% of the time.       Based on hx and exam, this is most likely:  Classic Upper airway cough syndrome, so named because it's frequently impossible to sort out how much is  CR/sinusitis with freq throat clearing (which can be related to primary GERD)   vs  causing  secondary (" extra esophageal")  GERD from wide swings in gastric pressure that occur with throat clearing, often  promoting self use of mint and menthol lozenges that reduce the lower esophageal sphincter tone and exacerbate the problem further in a cyclical fashion.   These are the same pts (now being labeled as having "irritable larynx syndrome" by some cough centers) who not infrequently have a history of having failed to tolerate ace inhibitors(and  even now losartan, see hbp) ,  dry powder inhalers or biphosphonates or report having atypical reflux symptoms that don't respond to standard doses of PPI , and are easily confused as having aecopd or asthma flares by even experienced allergists/ pulmonologists.   The first step is to maximize acid suppression / add 1st gen H1 per guidelines/ and change out arb on a trial basis then rx neurontin vs refer to [facility], defer to Dr [name]   See instructions for specific recommendations which were reviewed directly with the patient who was given a copy with highlighter outlining the key components.

## 2014-07-13 NOTE — Patient Instructions (Addendum)
Stop hyzar and start valsartan instead  Prilosec Take 30- 60 min before your first and last meals of the day   For drainage take chlortrimeton (chlorpheniramine) 4 mg every 4 hours available over the counter (may cause drowsiness)   GERD (REFLUX)  is an extremely common cause of respiratory symptoms just like yours , many times with no obvious heartburn at all.    It can be treated with medication, but also with lifestyle changes including avoidance of late meals, excessive alcohol, smoking cessation, and avoid fatty foods, chocolate, peppermint, colas, red wine, and acidic juices such as orange juice.  NO MINT OR MENTHOL PRODUCTS SO NO COUGH DROPS  USE SUGARLESS CANDY INSTEAD (Jolley ranchers or Stover's or Life Savers) or even ice chips will also do - the key is to swallow to prevent all throat clearing. NO OIL BASED VITAMINS - use powdered substitutes.   Please schedule a follow up office visit in 4 weeks, sooner if needed to see Dr [name] next available is fine

## 2014-07-13 NOTE — Telephone Encounter (Signed)
ATC PT NA. Received message no one is available to try my call again later

## 2014-07-17 MED ORDER — VALSARTAN-HYDROCHLOROTHIAZIDE 80-12.5 MG PO TABS: 1.0000 | ORAL_TABLET | Freq: Every day | ORAL | Status: DC

## 2014-07-17 NOTE — Telephone Encounter (Signed)
Spoke with pt.  She states that we sent rx for Valsartan to local pharmacy but she now needs it sent to [facility] for 3 mth supply.  Advised pt that rx was sent.

## 2014-08-09 DIAGNOSIS — S8012XA Contusion of left lower leg, initial encounter: Secondary | ICD-10-CM | POA: Diagnosis not present

## 2014-08-09 DIAGNOSIS — M5416 Radiculopathy, lumbar region: Secondary | ICD-10-CM | POA: Diagnosis not present

## 2014-08-09 DIAGNOSIS — M25552 Pain in left hip: Secondary | ICD-10-CM | POA: Diagnosis not present

## 2014-08-19 ENCOUNTER — Emergency Department (HOSPITAL_COMMUNITY): Payer: Medicare Other

## 2014-08-19 ENCOUNTER — Inpatient Hospital Stay (HOSPITAL_COMMUNITY): Payer: Medicare Other

## 2014-08-19 ENCOUNTER — Inpatient Hospital Stay (HOSPITAL_COMMUNITY)
Admission: EM | Admit: 2014-08-19 | Discharge: 2014-08-24 | DRG: 394 | Disposition: A | Discharge status: Home / Self Care | Payer: Medicare Other | Attending: Family Medicine | Admitting: Family Medicine

## 2014-08-19 ENCOUNTER — Encounter (HOSPITAL_COMMUNITY): Payer: Self-pay

## 2014-08-19 DIAGNOSIS — Z885 Allergy status to narcotic agent status: Secondary | ICD-10-CM | POA: Diagnosis not present

## 2014-08-19 DIAGNOSIS — Z8249 Family history of ischemic heart disease and other diseases of the circulatory system: Secondary | ICD-10-CM | POA: Diagnosis not present

## 2014-08-19 DIAGNOSIS — K8020 Calculus of gallbladder without cholecystitis without obstruction: Secondary | ICD-10-CM | POA: Diagnosis not present

## 2014-08-19 DIAGNOSIS — K224 Dyskinesia of esophagus: Secondary | ICD-10-CM | POA: Diagnosis present

## 2014-08-19 DIAGNOSIS — K219 Gastro-esophageal reflux disease without esophagitis: Secondary | ICD-10-CM | POA: Diagnosis present

## 2014-08-19 DIAGNOSIS — Z6835 Body mass index (BMI) 35.0-35.9, adult: Secondary | ICD-10-CM | POA: Diagnosis not present

## 2014-08-19 DIAGNOSIS — Z881 Allergy status to other antibiotic agents status: Secondary | ICD-10-CM | POA: Diagnosis not present

## 2014-08-19 DIAGNOSIS — J45909 Unspecified asthma, uncomplicated: Secondary | ICD-10-CM | POA: Diagnosis present

## 2014-08-19 DIAGNOSIS — E669 Obesity, unspecified: Secondary | ICD-10-CM | POA: Diagnosis present

## 2014-08-19 DIAGNOSIS — Z91041 Radiographic dye allergy status: Secondary | ICD-10-CM

## 2014-08-19 DIAGNOSIS — Z833 Family history of diabetes mellitus: Secondary | ICD-10-CM

## 2014-08-19 DIAGNOSIS — E1065 Type 1 diabetes mellitus with hyperglycemia: Secondary | ICD-10-CM | POA: Diagnosis not present

## 2014-08-19 DIAGNOSIS — R778 Other specified abnormalities of plasma proteins: Secondary | ICD-10-CM | POA: Diagnosis present

## 2014-08-19 DIAGNOSIS — K5900 Constipation, unspecified: Secondary | ICD-10-CM | POA: Diagnosis not present

## 2014-08-19 DIAGNOSIS — K439 Ventral hernia without obstruction or gangrene: Secondary | ICD-10-CM | POA: Diagnosis not present

## 2014-08-19 DIAGNOSIS — I10 Essential (primary) hypertension: Secondary | ICD-10-CM | POA: Diagnosis not present

## 2014-08-19 DIAGNOSIS — E559 Vitamin D deficiency, unspecified: Secondary | ICD-10-CM | POA: Diagnosis present

## 2014-08-19 DIAGNOSIS — N393 Stress incontinence (female) (male): Secondary | ICD-10-CM | POA: Diagnosis not present

## 2014-08-19 DIAGNOSIS — G3184 Mild cognitive impairment, so stated: Secondary | ICD-10-CM | POA: Diagnosis present

## 2014-08-19 DIAGNOSIS — R197 Diarrhea, unspecified: Secondary | ICD-10-CM | POA: Diagnosis not present

## 2014-08-19 DIAGNOSIS — I5181 Takotsubo syndrome: Secondary | ICD-10-CM | POA: Diagnosis not present

## 2014-08-19 DIAGNOSIS — M545 Low back pain, unspecified: Secondary | ICD-10-CM | POA: Diagnosis present

## 2014-08-19 DIAGNOSIS — K5660 Unspecified intestinal obstruction: Secondary | ICD-10-CM | POA: Diagnosis not present

## 2014-08-19 DIAGNOSIS — R0789 Other chest pain: Secondary | ICD-10-CM | POA: Diagnosis not present

## 2014-08-19 DIAGNOSIS — R079 Chest pain, unspecified: Secondary | ICD-10-CM | POA: Diagnosis not present

## 2014-08-19 DIAGNOSIS — K430 Incisional hernia with obstruction, without gangrene: Secondary | ICD-10-CM | POA: Diagnosis not present

## 2014-08-19 DIAGNOSIS — Z9049 Acquired absence of other specified parts of digestive tract: Secondary | ICD-10-CM | POA: Diagnosis not present

## 2014-08-19 DIAGNOSIS — G43909 Migraine, unspecified, not intractable, without status migrainosus: Secondary | ICD-10-CM | POA: Diagnosis present

## 2014-08-19 DIAGNOSIS — K598 Other specified functional intestinal disorders: Secondary | ICD-10-CM | POA: Diagnosis not present

## 2014-08-19 DIAGNOSIS — F329 Major depressive disorder, single episode, unspecified: Secondary | ICD-10-CM | POA: Diagnosis present

## 2014-08-19 DIAGNOSIS — K56609 Unspecified intestinal obstruction, unspecified as to partial versus complete obstruction: Secondary | ICD-10-CM | POA: Diagnosis present

## 2014-08-19 DIAGNOSIS — E039 Hypothyroidism, unspecified: Secondary | ICD-10-CM | POA: Diagnosis present

## 2014-08-19 DIAGNOSIS — Z7982 Long term (current) use of aspirin: Secondary | ICD-10-CM

## 2014-08-19 DIAGNOSIS — D649 Anemia, unspecified: Secondary | ICD-10-CM | POA: Diagnosis present

## 2014-08-19 DIAGNOSIS — Z9884 Bariatric surgery status: Secondary | ICD-10-CM

## 2014-08-19 DIAGNOSIS — I5032 Chronic diastolic (congestive) heart failure: Secondary | ICD-10-CM | POA: Diagnosis present

## 2014-08-19 DIAGNOSIS — R7989 Other specified abnormal findings of blood chemistry: Secondary | ICD-10-CM | POA: Diagnosis present

## 2014-08-19 DIAGNOSIS — I2510 Atherosclerotic heart disease of native coronary artery without angina pectoris: Secondary | ICD-10-CM | POA: Diagnosis present

## 2014-08-19 DIAGNOSIS — Z888 Allergy status to other drugs, medicaments and biological substances status: Secondary | ICD-10-CM | POA: Diagnosis not present

## 2014-08-19 DIAGNOSIS — E539 Vitamin B deficiency, unspecified: Secondary | ICD-10-CM | POA: Diagnosis present

## 2014-08-19 DIAGNOSIS — Z79899 Other long term (current) drug therapy: Secondary | ICD-10-CM | POA: Diagnosis not present

## 2014-08-19 DIAGNOSIS — E1121 Type 2 diabetes mellitus with diabetic nephropathy: Secondary | ICD-10-CM

## 2014-08-19 DIAGNOSIS — I25119 Atherosclerotic heart disease of native coronary artery with unspecified angina pectoris: Secondary | ICD-10-CM | POA: Diagnosis present

## 2014-08-19 DIAGNOSIS — I429 Cardiomyopathy, unspecified: Secondary | ICD-10-CM | POA: Diagnosis not present

## 2014-08-19 DIAGNOSIS — F32A Depression, unspecified: Secondary | ICD-10-CM | POA: Diagnosis present

## 2014-08-19 DIAGNOSIS — E785 Hyperlipidemia, unspecified: Secondary | ICD-10-CM | POA: Diagnosis not present

## 2014-08-19 DIAGNOSIS — E119 Type 2 diabetes mellitus without complications: Secondary | ICD-10-CM

## 2014-08-19 DIAGNOSIS — Z90710 Acquired absence of both cervix and uterus: Secondary | ICD-10-CM

## 2014-08-19 DIAGNOSIS — M109 Gout, unspecified: Secondary | ICD-10-CM | POA: Diagnosis present

## 2014-08-19 DIAGNOSIS — Z955 Presence of coronary angioplasty implant and graft: Secondary | ICD-10-CM

## 2014-08-19 DIAGNOSIS — G894 Chronic pain syndrome: Secondary | ICD-10-CM | POA: Diagnosis present

## 2014-08-19 DIAGNOSIS — Z794 Long term (current) use of insulin: Secondary | ICD-10-CM

## 2014-08-19 DIAGNOSIS — K432 Incisional hernia without obstruction or gangrene: Secondary | ICD-10-CM

## 2014-08-19 DIAGNOSIS — R300 Dysuria: Secondary | ICD-10-CM | POA: Diagnosis not present

## 2014-08-19 DIAGNOSIS — K5669 Other intestinal obstruction: Secondary | ICD-10-CM | POA: Diagnosis not present

## 2014-08-19 DIAGNOSIS — E11351 Type 2 diabetes mellitus with proliferative diabetic retinopathy with macular edema: Secondary | ICD-10-CM | POA: Diagnosis not present

## 2014-08-19 LAB — CBC WITH DIFFERENTIAL/PLATELET
Basophils Absolute: 0 10*3/uL (ref 0.0–0.1)
Basophils Relative: 0 % (ref 0–1)
Eosinophils Absolute: 0 10*3/uL (ref 0.0–0.7)
Eosinophils Relative: 0 % (ref 0–5)
HCT: 41.6 % (ref 36.0–46.0)
HEMOGLOBIN: 13 g/dL (ref 12.0–15.0)
LYMPHS ABS: 1.4 10*3/uL (ref 0.7–4.0)
Lymphocytes Relative: 11 % — ABNORMAL LOW (ref 12–46)
MCH: 26.3 pg (ref 26.0–34.0)
MCHC: 31.3 g/dL (ref 30.0–36.0)
MCV: 84.2 fL (ref 78.0–100.0)
Monocytes Absolute: 0.6 10*3/uL (ref 0.1–1.0)
Monocytes Relative: 4 % (ref 3–12)
Neutro Abs: 11.3 10*3/uL — ABNORMAL HIGH (ref 1.7–7.7)
Neutrophils Relative %: 85 % — ABNORMAL HIGH (ref 43–77)
PLATELETS: 357 10*3/uL (ref 150–400)
RBC: 4.94 MIL/uL (ref 3.87–5.11)
RDW: 15.4 % (ref 11.5–15.5)
WBC: 13.3 10*3/uL — AB (ref 4.0–10.5)

## 2014-08-19 LAB — COMPREHENSIVE METABOLIC PANEL
ALK PHOS: 113 U/L (ref 39–117)
ALT: 14 U/L (ref 0–35)
ANION GAP: 10 (ref 5–15)
AST: 21 U/L (ref 0–37)
Albumin: 3.8 g/dL (ref 3.5–5.2)
BILIRUBIN TOTAL: 0.9 mg/dL (ref 0.3–1.2)
BUN: 14 mg/dL (ref 6–23)
CO2: 26 mmol/L (ref 19–32)
CREATININE: 0.78 mg/dL (ref 0.50–1.10)
Calcium: 8.7 mg/dL (ref 8.4–10.5)
Chloride: 95 mmol/L — ABNORMAL LOW (ref 96–112)
GFR calc Af Amer: >90 mL/min (ref >90)
GFR calc non Af Amer: 79 mL/min — ABNORMAL LOW (ref >90)
Glucose, Bld: 392 mg/dL — ABNORMAL HIGH (ref 70–99)
Potassium: 3.6 mmol/L (ref 3.5–5.1)
SODIUM: 131 mmol/L — AB (ref 135–145)
Total Protein: 7.2 g/dL (ref 6.0–8.3)

## 2014-08-19 LAB — URINALYSIS, ROUTINE W REFLEX MICROSCOPIC
BILIRUBIN URINE: NEGATIVE
KETONES UR: NEGATIVE mg/dL
Leukocytes, UA: NEGATIVE
NITRITE: NEGATIVE
Protein, ur: 100 mg/dL — AB
Specific Gravity, Urine: 1.021 (ref 1.005–1.030)
Urobilinogen, UA: 0.2 mg/dL (ref 0.0–1.0)
pH: 6.5 (ref 5.0–8.0)

## 2014-08-19 LAB — GLUCOSE, CAPILLARY
GLUCOSE-CAPILLARY: 372 mg/dL — AB (ref 70–99)
GLUCOSE-CAPILLARY: 286 mg/dL — AB (ref 70–99)
GLUCOSE-CAPILLARY: 172 mg/dL — AB (ref 70–99)
GLUCOSE-CAPILLARY: 156 mg/dL — AB (ref 70–99)
Glucose-Capillary: 251 mg/dL — ABNORMAL HIGH (ref 70–99)

## 2014-08-19 LAB — PROTIME-INR
INR: 0.99 (ref 0.00–1.49)
PROTHROMBIN TIME: 13.2 s (ref 11.6–15.2)

## 2014-08-19 LAB — TYPE AND SCREEN
ABO/RH(D): O POS
Antibody Screen: NEGATIVE

## 2014-08-19 LAB — BRAIN NATRIURETIC PEPTIDE: B Natriuretic Peptide: 363.4 pg/mL — ABNORMAL HIGH (ref 0.0–100.0)

## 2014-08-19 LAB — TROPONIN I
TROPONIN I: 1.08 ng/mL — AB (ref <0.031)
Troponin I: 1 ng/mL (ref <0.031)
Troponin I: 0.99 ng/mL (ref <0.031)
Troponin I: 0.75 ng/mL (ref <0.031)
Troponin I: 0.67 ng/mL (ref <0.031)

## 2014-08-19 LAB — URINE MICROSCOPIC-ADD ON

## 2014-08-19 LAB — LIPASE, BLOOD: LIPASE: 24 U/L (ref 11–59)

## 2014-08-19 LAB — APTT: aPTT: 26 seconds (ref 24–37)

## 2014-08-19 MED ORDER — INSULIN ASPART 100 UNIT/ML ~~LOC~~ SOLN
0.0000 [IU] | Freq: Three times a day (TID) | SUBCUTANEOUS | Status: DC
  Administered 2014-08-19: 9 [IU] via SUBCUTANEOUS

## 2014-08-19 MED ORDER — HYDROMORPHONE HCL 1 MG/ML IJ SOLN
1.0000 mg | Freq: Once | INTRAMUSCULAR | Status: AC
  Administered 2014-08-19: 1 mg via INTRAVENOUS
  Filled 2014-08-19: qty 1

## 2014-08-19 MED ORDER — METHYLPREDNISOLONE SODIUM SUCC 125 MG IJ SOLR
125.0000 mg | Freq: Once | INTRAMUSCULAR | Status: AC
  Administered 2014-08-19: 125 mg via INTRAVENOUS
  Filled 2014-08-19: qty 2

## 2014-08-19 MED ORDER — IOHEXOL 300 MG/ML  SOLN
50.0000 mL | Freq: Once | INTRAMUSCULAR | Status: AC | PRN
  Administered 2014-08-19: 50 mL via ORAL

## 2014-08-19 MED ORDER — ONDANSETRON HCL 4 MG/2ML IJ SOLN: 4.0000 mg | Freq: Three times a day (TID) | INTRAMUSCULAR | Status: AC | PRN

## 2014-08-19 MED ORDER — ASPIRIN EC 81 MG PO TBEC
81.0000 mg | DELAYED_RELEASE_TABLET | Freq: Every day | ORAL | Status: DC
  Administered 2014-08-19 – 2014-08-21 (×2): 81 mg via ORAL
  Filled 2014-08-19 (×2): qty 1

## 2014-08-19 MED ORDER — ACETAMINOPHEN 650 MG RE SUPP: 650.0000 mg | Freq: Four times a day (QID) | RECTAL | Status: DC | PRN

## 2014-08-19 MED ORDER — SODIUM CHLORIDE 0.9 % IJ SOLN
3.0000 mL | Freq: Two times a day (BID) | INTRAMUSCULAR | Status: DC
  Administered 2014-08-20 – 2014-08-23 (×4): 3 mL via INTRAVENOUS

## 2014-08-19 MED ORDER — CITALOPRAM HYDROBROMIDE 40 MG PO TABS
40.0000 mg | ORAL_TABLET | Freq: Every day | ORAL | Status: DC
  Administered 2014-08-19 – 2014-08-23 (×3): 40 mg via ORAL
  Filled 2014-08-19 (×5): qty 1

## 2014-08-19 MED ORDER — ONDANSETRON HCL 4 MG/2ML IJ SOLN
4.0000 mg | Freq: Once | INTRAMUSCULAR | Status: AC
  Administered 2014-08-19: 4 mg via INTRAVENOUS
  Filled 2014-08-19: qty 2

## 2014-08-19 MED ORDER — DIPHENHYDRAMINE HCL 50 MG/ML IJ SOLN
25.0000 mg | Freq: Once | INTRAMUSCULAR | Status: AC
  Administered 2014-08-19: 25 mg via INTRAVENOUS
  Filled 2014-08-19: qty 1

## 2014-08-19 MED ORDER — NITROGLYCERIN 0.4 MG SL SUBL
0.4000 mg | SUBLINGUAL_TABLET | SUBLINGUAL | Status: DC | PRN
  Administered 2014-08-19 – 2014-08-20 (×6): 0.4 mg via SUBLINGUAL
  Filled 2014-08-19 (×3): qty 1

## 2014-08-19 MED ORDER — HYDRALAZINE HCL 20 MG/ML IJ SOLN: 5.0000 mg | INTRAMUSCULAR | Status: DC | PRN

## 2014-08-19 MED ORDER — ASPIRIN 81 MG PO CHEW: 324.0000 mg | CHEWABLE_TABLET | Freq: Once | ORAL | Status: DC

## 2014-08-19 MED ORDER — HYDROMORPHONE HCL 1 MG/ML IJ SOLN: 0.5000 mg | INTRAMUSCULAR | Status: DC | PRN

## 2014-08-19 MED ORDER — ATORVASTATIN CALCIUM 40 MG PO TABS
40.0000 mg | ORAL_TABLET | Freq: Every day | ORAL | Status: DC
  Administered 2014-08-19 – 2014-08-23 (×5): 40 mg via ORAL
  Filled 2014-08-19 (×6): qty 1

## 2014-08-19 MED ORDER — MAGNESIUM OXIDE 400 (241.3 MG) MG PO TABS
400.0000 mg | ORAL_TABLET | Freq: Every day | ORAL | Status: DC
  Administered 2014-08-19 – 2014-08-24 (×4): 400 mg via ORAL
  Filled 2014-08-19 (×5): qty 1

## 2014-08-19 MED ORDER — HEPARIN SODIUM (PORCINE) 5000 UNIT/ML IJ SOLN
5000.0000 [IU] | Freq: Three times a day (TID) | INTRAMUSCULAR | Status: DC
  Administered 2014-08-19 – 2014-08-20 (×4): 5000 [IU] via SUBCUTANEOUS
  Filled 2014-08-19 (×4): qty 1

## 2014-08-19 MED ORDER — PANTOPRAZOLE SODIUM 40 MG IV SOLR
40.0000 mg | INTRAVENOUS | Status: DC
  Administered 2014-08-19 – 2014-08-22 (×4): 40 mg via INTRAVENOUS
  Filled 2014-08-19 (×4): qty 40

## 2014-08-19 MED ORDER — ACETAMINOPHEN 325 MG PO TABS
650.0000 mg | ORAL_TABLET | Freq: Four times a day (QID) | ORAL | Status: DC | PRN
  Administered 2014-08-19: 650 mg via ORAL
  Filled 2014-08-19: qty 2

## 2014-08-19 MED ORDER — SODIUM CHLORIDE 0.9 % IV BOLUS (SEPSIS)
1000.0000 mL | Freq: Once | INTRAVENOUS | Status: AC
  Administered 2014-08-19: 1000 mL via INTRAVENOUS

## 2014-08-19 MED ORDER — IOHEXOL 300 MG/ML  SOLN
100.0000 mL | Freq: Once | INTRAMUSCULAR | Status: AC | PRN
  Administered 2014-08-19: 100 mL via INTRAVENOUS

## 2014-08-19 MED ORDER — INSULIN ASPART 100 UNIT/ML ~~LOC~~ SOLN
0.0000 [IU] | SUBCUTANEOUS | Status: DC
  Administered 2014-08-19 (×2): 8 [IU] via SUBCUTANEOUS
  Administered 2014-08-19 (×2): 3 [IU] via SUBCUTANEOUS
  Administered 2014-08-20: 5 [IU] via SUBCUTANEOUS
  Administered 2014-08-20: 3 [IU] via SUBCUTANEOUS
  Administered 2014-08-21: 5 [IU] via SUBCUTANEOUS
  Administered 2014-08-21: 8 [IU] via SUBCUTANEOUS

## 2014-08-19 MED ORDER — METOPROLOL TARTRATE 1 MG/ML IV SOLN
2.5000 mg | INTRAVENOUS | Status: DC | PRN
  Administered 2014-08-20: 2.5 mg via INTRAVENOUS
  Filled 2014-08-19 (×2): qty 5

## 2014-08-19 MED ORDER — VITAMIN D 1000 UNITS PO TABS
5000.0000 [IU] | ORAL_TABLET | Freq: Every day | ORAL | Status: DC
  Administered 2014-08-19 – 2014-08-24 (×4): 5000 [IU] via ORAL
  Filled 2014-08-19 (×5): qty 5

## 2014-08-19 MED ORDER — SODIUM CHLORIDE 0.9 % IV SOLN
INTRAVENOUS | Status: DC
  Administered 2014-08-19 (×2): via INTRAVENOUS

## 2014-08-19 MED ORDER — NITROGLYCERIN 0.4 MG SL SUBL: 0.4000 mg | SUBLINGUAL_TABLET | SUBLINGUAL | Status: DC | PRN

## 2014-08-19 MED ORDER — HYDROXYZINE HCL 50 MG/ML IM SOLN
25.0000 mg | INTRAMUSCULAR | Status: DC | PRN
  Filled 2014-08-19: qty 0.5

## 2014-08-19 MED ORDER — LORATADINE 10 MG PO TABS
10.0000 mg | ORAL_TABLET | Freq: Every day | ORAL | Status: DC
  Administered 2014-08-22 – 2014-08-24 (×3): 10 mg via ORAL
  Filled 2014-08-19 (×5): qty 1

## 2014-08-19 MED ORDER — ASPIRIN 81 MG PO CHEW
243.0000 mg | CHEWABLE_TABLET | Freq: Once | ORAL | Status: AC
  Administered 2014-08-19: 243 mg via ORAL
  Filled 2014-08-19: qty 3

## 2014-08-19 MED ORDER — INSULIN GLARGINE 100 UNIT/ML ~~LOC~~ SOLN
5.0000 [IU] | Freq: Every day | SUBCUTANEOUS | Status: DC
  Administered 2014-08-19 – 2014-08-22 (×3): 5 [IU] via SUBCUTANEOUS
  Filled 2014-08-19 (×5): qty 0.05

## 2014-08-19 NOTE — Progress Notes (Signed)
Pt began having Chest Pressure. EKG obtained. VSS. PT received nitro x 3. Pain is now 1/10 compared to initial 5/10. Paged hospitalist and cardiologist. Will continue to monitor.

## 2014-08-19 NOTE — Progress Notes (Signed)
UR Completed.  [phone]  

## 2014-08-19 NOTE — ED Provider Notes (Signed)
CSN: [id number]     Arrival date & time 08/19/14  0110 History   First MD Initiated Contact with Patient 08/19/14 0142     No chief complaint on file.    (Consider location/radiation/quality/duration/timing/severity/associated sxs/prior Treatment) HPI Comments: Patient is a 74 year old female with a past medical history of diabetes and hypertension who presents with abdominal pain that started 5 hours ago. The pain is located in her upper abdomen and radiates to her chest. The pain is described as cramping and severe. The pain started gradually and progressively worsened since the onset. Patient suspects she has food poisoning after eating at a [employer]. No alleviating/aggravating factors. The patient has tried nothing for symptoms without relief. Associated symptoms include nausea and vomiting. Patient denies fever, headache, diarrhea, chest pain, SOB, dysuria, constipation, abnormal vaginal bleeding/discharge. Patient reports a history of gastric bypass surgery and hernia repair.      Past Medical History  Diagnosis Date  . Hypertension   . Diabetes mellitus   . High cholesterol   . Other specified disease of sebaceous glands   . Other B-complex deficiencies   . Depressive disorder, not elsewhere classified   . Tension headache   . Migraine without aura, with intractable migraine, so stated, with status migrainosus   . Type I (juvenile type) diabetes mellitus without mention of complication, uncontrolled   . Unspecified vitamin D deficiency   . Dyskinesia of esophagus   . Memory loss   . Mild cognitive impairment, so stated   . Other nonspecific abnormal serum enzyme levels   . Lipoma of other skin and subcutaneous tissue   . Pain in joint, shoulder region   . Nonspecific (abnormal) findings on radiological and other examination of abdominal area, including retroperitoneum   . Nonspecific abnormal results of liver function study   . Pain in joint, pelvic region and thigh    . Pain in joint, ankle and foot   . Edema   . Lumbago   . Other symptoms involving cardiovascular system   . Abdominal pain, unspecified site   . Other specified cardiac dysrhythmias(427.89)   . Ventral hernia, unspecified, without mention of obstruction or gangrene   . Extrinsic asthma, unspecified   . Dizziness and giddiness   . Palpitations   . Shortness of breath   . Reflux esophagitis   . Obesity, unspecified   . Unspecified hypothyroidism   . Type I (juvenile type) diabetes mellitus without mention of complication, not stated as uncontrolled   . Other and unspecified hyperlipidemia   . Gout, unspecified   . Unspecified essential hypertension   . Encounter for long-term (current) use of other medications   . Abnormality of gait   . Chest pain, unspecified   . Obstructive chronic bronchitis with exacerbation   . Female stress incontinence   . Apnea   . Cardiomegaly   . Complication of anesthesia     hard time waking up   Past Surgical History  Procedure Laterality Date  . Stents      last time she said was 1 yr ago   . Breast surgery    . Appendectomy    . Abdominal hysterectomy    . Gastric bypass  2005  approx   Family History  Problem Relation Age of Onset  . Stroke Mother   . Stroke Father   . Diabetes Father   . Heart disease Father   . Diabetes Son   . Cancer Brother  BLADDER  . Diabetes Brother    History  Substance Use Topics  . Smoking status: Never Smoker   . Smokeless tobacco: Never Used  . Alcohol Use: No   OB History    No data available     Review of Systems  Constitutional: Negative for fever, chills and fatigue.  HENT: Negative for trouble swallowing.   Eyes: Negative for visual disturbance.  Respiratory: Negative for shortness of breath.   Cardiovascular: Positive for chest pain. Negative for palpitations.  Gastrointestinal: Positive for nausea, vomiting and abdominal pain. Negative for diarrhea.  Genitourinary: Negative for  dysuria and difficulty urinating.  Musculoskeletal: Negative for arthralgias and neck pain.  Skin: Negative for color change.  Neurological: Negative for dizziness and weakness.  Psychiatric/Behavioral: Negative for dysphoric mood.      Allergies  Ciprofloxacin; Advair diskus; Albuterol; Alendronate sodium; Benadryl; Benzonatate; Cephalexin; Gabapentin; Iohexol; Keflex; Morphine; Other; Propoxyphene hcl; Reclast; Avapro; Codeine; Tessalon perles; and Tramadol  Home Medications   Prior to Admission medications   Medication Sig Start Date End Date Taking? Authorizing Provider  amoxicillin (AMOXIL) 500 MG capsule 2 capsules daily. Prior to dental work 07/09/14   Historical Provider, MD  aspirin 81 MG tablet Take 81 mg by mouth daily.     Historical Provider, MD  cetirizine (ZYRTEC) 10 MG tablet Take 10 mg by mouth daily. For allergies    Historical Provider, MD  chlorpheniramine-HYDROcodone (TUSSIONEX) 10-8 MG/5ML LQCR Take 5 mLs by mouth every 12 (twelve) hours. Take scheduled for 1 week and then as needed for cough. 06/03/14   Bonnielee Haff, MD  Cholecalciferol (VITAMIN D3) 5000 UNITS CAPS Take 1 tablet by mouth daily. For supplement    Historical Provider, MD  citalopram (CELEXA) 40 MG tablet Take 1 tablet (40 mg total) by mouth daily. 12/06/12   [name], MD  furosemide (LASIX) 40 MG tablet One each morning to treat edema 06/07/13   [name], MD  glipiZIDE (GLUCOTROL XL) 10 MG 24 hr tablet Take 1 tablet (10 mg total) by mouth 2 (two) times daily. Take in the morning and evening to control blood sugar 12/06/12   [name], MD  insulin NPH (HUMULIN N,NOVOLIN N) 100 UNIT/ML injection 25 units in the morning and 20 units in the evening to control diabetes. 12/06/12   [name], MD  insulin regular (HUMULIN R) 100 units/mL injection Inject 2-5 Units into the skin 3 (three) times daily before meals. Per Sliding scale- as needed    Historical Provider, MD  Magnesium Oxide 250 MG TABS  Take 250 mg by mouth daily.     Historical Provider, MD  nitroGLYCERIN (NITROSTAT) 0.4 MG SL tablet Place 1 tablet (0.4 mg total) under the tongue every 5 (five) minutes as needed. For chest pain. Maximum of 3 tablets in 15 minutes. 12/06/12   [name], MD  PRILOSEC 40 MG capsule TAKE 1 CAPSULE TWICE DAILY FOR ACID REFLUX 05/09/14   [name], MD  valsartan-hydrochlorothiazide (DIOVAN HCT) 80-12.5 MG per tablet Take 1 tablet by mouth daily. 07/17/14   Tanda Rockers, MD   BP 163/89 mmHg  Pulse 74  Temp(Src) 98.4 F (36.9 C) (Oral)  Resp 18  SpO2 98% Physical Exam  Constitutional: She is oriented to person, place, and time. She appears well-developed and well-nourished. No distress.  HENT:  Head: Normocephalic and atraumatic.  Eyes: Conjunctivae and EOM are normal.  Neck: Normal range of motion.  Cardiovascular: Normal rate and regular  rhythm.  Exam reveals no gallop and no friction rub.   No murmur heard. Pulmonary/Chest: Effort normal and breath sounds normal. She has no wheezes. She has no rales. She exhibits no tenderness.  Abdominal: Soft. She exhibits no distension. There is no tenderness. There is no rebound.  Musculoskeletal: Normal range of motion.  Neurological: She is alert and oriented to person, place, and time. Coordination normal.  Speech is goal-oriented. Moves limbs without ataxia.   Skin: Skin is warm and dry.  Psychiatric: She has a normal mood and affect. Her behavior is normal.  Nursing note and vitals reviewed.   ED Course  Procedures (including critical care time) Labs Review Labs Reviewed  CBC WITH DIFFERENTIAL/PLATELET - Abnormal; Notable for the following:    WBC 13.3 (*)    Neutrophils Relative % 85 (*)    Neutro Abs 11.3 (*)    Lymphocytes Relative 11 (*)    All other components within normal limits  URINALYSIS, ROUTINE W REFLEX MICROSCOPIC - Abnormal; Notable for the following:    APPearance CLOUDY (*)    Glucose, UA >1000 (*)    Hgb urine  dipstick TRACE (*)    Protein, ur 100 (*)    All other components within normal limits  COMPREHENSIVE METABOLIC PANEL - Abnormal; Notable for the following:    Sodium 131 (*)    Chloride 95 (*)    Glucose, Bld 392 (*)    GFR calc non Af Amer 79 (*)    All other components within normal limits  URINE MICROSCOPIC-ADD ON - Abnormal; Notable for the following:    Squamous Epithelial / LPF FEW (*)    All other components within normal limits  LIPASE, BLOOD  TROPONIN I    Imaging Review Dg Chest 2 View  08/19/2014   CLINICAL DATA:  Chest pain, nausea and vomiting  EXAM: CHEST  2 VIEW  COMPARISON:  06/11/2014  FINDINGS: There is mild vascular prominence without interstitial or alveolar edema. There are no airspace opacities. There are no effusions. There is chronic compression of a lower thoracic vertebral body, unchanged.  IMPRESSION: Mild vascular congestion without interstitial or alveolar edema.   Electronically Signed   By: Andreas Newport M.D.   On: 08/19/2014 02:51   Ct Abdomen Pelvis W Contrast  08/19/2014   CLINICAL DATA:  Chest pain, nausea, vomiting, diarrhea for 5 hours.  EXAM: CT ABDOMEN AND PELVIS WITH CONTRAST  TECHNIQUE: Multidetector CT imaging of the abdomen and pelvis was performed using the standard protocol following bolus administration of intravenous contrast.  CONTRAST:  89mL OMNIPAQUE IOHEXOL 300 MG/ML SOLN, 167mL OMNIPAQUE IOHEXOL 300 MG/ML SOLN  COMPARISON:  01/24/2009  FINDINGS: There is a high-grade obstruction of the mid jejunum, with marked dilatation of bowel proximal to the transition point. The transition point is in the mid abdomen just to the left of midline, probably in close proximity to the roux-en-Y anastomosis. There is a large ventral hernia in the same area, but the hernia does not appear to be related to the obstruction. The distal jejunum and the entire ileum are decompressed. The colon is decompressed. There is no extraluminal air. There are unremarkable  appearances of the gastrojejunostomy.  There are normal appearances of the liver with the exception of a tiny benign hypodensity which is unchanged from 2010. There are normal appearances of the spleen, pancreas, adrenals and kidneys with the exception of a few benign appearing renal cysts. The abdominal aorta is normal in caliber with mild atherosclerotic  calcification. Gallbladder and bile ducts are remarkable only for a few small calculi in the gallbladder lumen. There is no bile duct dilatation.  There is hysterectomy.  No significant abnormality is evident in the lower chest.  IMPRESSION: *High-grade small bowel obstruction in the mid jejunum, most likely due to adhesion. *Large ventral hernia which is not the etiology of the bowel obstruction. *Unremarkable gastric jejunostomy *Cholelithiasis   Electronically Signed   By: Andreas Newport M.D.   On: 08/19/2014 04:48     EKG Interpretation   Date/Time:  Sunday August 19 2014 04:41:07 EDT Ventricular Rate:  87 PR Interval:  147 QRS Duration: 76 QT Interval:  404 QTC Calculation: 486 R Axis:   20 Text Interpretation:  Sinus rhythm Baseline wander in lead(s) III  Borderline ST elevation in lateral leads Nonspecific T wave abnormality  When compared with ECG of 05/29/2014, ST elevation in Lateral leads is now  Present Nonspecific T wave abnormality is now Present Confirmed by [facility]   MD, DAVID ([id number]) on 08/19/2014 4:48:15 AM      MDM   Final diagnoses:  Chest pain  Small bowel obstruction    2:48 AM Labs and chest xray pending. Vitals stable and patient afebrile. Patient given dilaudid and zofran for pain.   6:03 AM CT shows high grade bowel obstruction. Patient admitted to Medicine by Dr. Blaine Hamper. I spoke with Dr. Excell Seltzer. Patient will also be seen by General surgery.    Alvina Chou, PA-C 03/50/09 [id number]  Delora Fuel, MD 29/93/71 [id number]

## 2014-08-19 NOTE — ED Notes (Signed)
Bed: WA04 Expected date:  Expected time:  Means of arrival:  Comments: EMS 89F Chest pain, N/V/D

## 2014-08-19 NOTE — H&P (Addendum)
[facility] History and Physical  [name] MWN:[id number] DOB: 09/18/1940 DOA: 08/19/2014  Referring physician: ED physician PCP: [name], MD   Specialists:   Chief Complaint: Abdominal pain, nausea, vomiting  HPI: [name] is a 74 y.o. female with past medical history for GERD, hyperlipidemia, gout, hypertension, hypothyroidism, asthma, diabetes, depression, diastolic congestive heart failure, history of gastric bypass surgery 10 years ago, stent placement, who presents with abdominal pain, nausea and vomiting.  Patient's abdominal pain started 5 hours ago prior to ED arrival. The pain is located in her upper abdomen and radiates to her chest. The pain is described as cramping and severe. The abdominal pain has been progressively getting worse. It is associated with nausea and multiple vomiting, without blood in vomitus. Patient suspects she has food poisoning after eating at a [employer]. She does not have diarrhea.  Patient also reports having mild chest pain. It is located in the substernal area, constant, radiating to the left shoulder and the neck. Patient does not have cough, shortness of breath. No fever or chills. Patient reports having dysuria in the past several days, but no burning or increased urinary frequency. Patient denies fever, chills, headaches, cough, SOB, hematuria, skin rashes or leg swelling. No unilateral weakness, numbness or tingling sensations. No vision change or hearing loss.  In ED, patient was found to have high-grade small bowel obstruction by CT abdomen/pelvis. Chest x-ray showed mild vascular congestion, no pneumonia. Elevated trop at 1.08. EKG showed nonspecific T-wave flattening, QTC 486,  CBC 13.3, negative urinalysis, lipase 24, temperature 98.3, no tachycardia, electrolytes okay. Patient is admitted to inpatient for further evaluation and treatment. General surgery was consulted by ED.  Review of Systems: As presented in the  history of presenting illness, rest negative.  Where does patient live?  At home Can patient participate in ADLs? little  Allergy:  Allergies  Allergen Reactions  . Ciprofloxacin Rash  . Advair Diskus [Fluticasone-Salmeterol] Other (See Comments)    Throat closes  . Albuterol     REACTION: closes throat  . Alendronate Sodium Itching  . Benadryl [Diphenhydramine Hcl]   . Benzonatate     REACTION: rash/hives  . Cephalexin Nausea Only  . Gabapentin Other (See Comments)    Loss of memory  . Iohexol      Desc: rash and DIF BREATHING   . Keflex [Cephalexin]   . Morphine Nausea And Vomiting  . Other     Decongestants  . Propoxyphene Hcl   . Reclast [Zoledronic Acid] Other (See Comments)    Chest pain  . Avapro [Irbesartan] Rash  . Codeine Nausea And Vomiting, Swelling and Rash  . Tessalon Perles Rash  . Tramadol Nausea Only and Rash    Past Medical History  Diagnosis Date  . Hypertension   . Diabetes mellitus   . High cholesterol   . Other specified disease of sebaceous glands   . Other B-complex deficiencies   . Depressive disorder, not elsewhere classified   . Tension headache   . Migraine without aura, with intractable migraine, so stated, with status migrainosus   . Type I (juvenile type) diabetes mellitus without mention of complication, uncontrolled   . Unspecified vitamin D deficiency   . Dyskinesia of esophagus   . Memory loss   . Mild cognitive impairment, so stated   . Other nonspecific abnormal serum enzyme levels   . Lipoma of other skin and subcutaneous tissue   . Pain in joint, shoulder region   .  Nonspecific (abnormal) findings on radiological and other examination of abdominal area, including retroperitoneum   . Nonspecific abnormal results of liver function study   . Pain in joint, pelvic region and thigh   . Pain in joint, ankle and foot   . Edema   . Lumbago   . Other symptoms involving cardiovascular system   . Abdominal pain, unspecified site    . Other specified cardiac dysrhythmias(427.89)   . Ventral hernia, unspecified, without mention of obstruction or gangrene   . Extrinsic asthma, unspecified   . Dizziness and giddiness   . Palpitations   . Shortness of breath   . Reflux esophagitis   . Obesity, unspecified   . Unspecified hypothyroidism   . Type I (juvenile type) diabetes mellitus without mention of complication, not stated as uncontrolled   . Other and unspecified hyperlipidemia   . Gout, unspecified   . Unspecified essential hypertension   . Encounter for long-term (current) use of other medications   . Abnormality of gait   . Chest pain, unspecified   . Obstructive chronic bronchitis with exacerbation   . Female stress incontinence   . Apnea   . Cardiomegaly   . Complication of anesthesia     hard time waking up    Past Surgical History  Procedure Laterality Date  . Stents      last time she said was 1 yr ago   . Breast surgery    . Appendectomy    . Abdominal hysterectomy    . Gastric bypass  2005  approx    Social History:  reports that she has never smoked. She has never used smokeless tobacco. She reports that she does not drink alcohol or use illicit drugs.  Family History:  Family History  Problem Relation Age of Onset  . Stroke Mother   . Stroke Father   . Diabetes Father   . Heart disease Father   . Diabetes Son   . Cancer Brother     BLADDER  . Diabetes Brother      Prior to Admission medications   Medication Sig Start Date End Date Taking? Authorizing Provider  amoxicillin (AMOXIL) 500 MG capsule 2 capsules daily. Prior to dental work 07/09/14  Yes Historical Provider, MD  aspirin 81 MG tablet Take 81 mg by mouth daily.    Yes Historical Provider, MD  cetirizine (ZYRTEC) 10 MG tablet Take 10 mg by mouth daily as needed for allergies. For allergies   Yes Historical Provider, MD  Cholecalciferol (VITAMIN D3) 5000 UNITS CAPS Take 1 tablet by mouth daily. For supplement   Yes Historical  Provider, MD  citalopram (CELEXA) 40 MG tablet Take 1 tablet (40 mg total) by mouth daily. 12/06/12  Yes [name], MD  furosemide (LASIX) 40 MG tablet One each morning to treat edema Patient taking differently: Take 40 mg by mouth daily as needed for fluid. One each morning to treat edema 06/07/13  Yes [name], MD  glipiZIDE (GLUCOTROL XL) 10 MG 24 hr tablet Take 1 tablet (10 mg total) by mouth 2 (two) times daily. Take in the morning and evening to control blood sugar 12/06/12  Yes [name], MD  insulin NPH (HUMULIN N,NOVOLIN N) 100 UNIT/ML injection 25 units in the morning and 20 units in the evening to control diabetes. 12/06/12  Yes [name], MD  Magnesium Oxide 250 MG TABS Take 250 mg by mouth daily.    Yes Historical Provider, MD  nitroGLYCERIN (NITROSTAT) 0.4 MG SL tablet Place 1 tablet (0.4 mg total) under the tongue every 5 (five) minutes as needed. For chest pain. Maximum of 3 tablets in 15 minutes. 12/06/12  Yes [name], MD  PRILOSEC 40 MG capsule TAKE 1 CAPSULE TWICE DAILY FOR ACID REFLUX 05/09/14  Yes [name], MD  valsartan-hydrochlorothiazide (DIOVAN HCT) 80-12.5 MG per tablet Take 1 tablet by mouth daily. 07/17/14  Yes Tanda Rockers, MD  chlorpheniramine-HYDROcodone (TUSSIONEX) 10-8 MG/5ML LQCR Take 5 mLs by mouth every 12 (twelve) hours. Take scheduled for 1 week and then as needed for cough. Patient not taking: Reported on 08/19/2014 06/03/14   Bonnielee Haff, MD    Physical Exam: Filed Vitals:   08/19/14 0255 08/19/14 0441 08/19/14 0632 08/19/14 0643  BP: 131/76 120/77 110/53 123/71  Pulse: 91 89 92 91  Temp:  98.3 F (36.8 C) 98 F (36.7 C) 97.9 F (36.6 C)  TempSrc:  Oral Oral Oral  Resp: 14 13 18 20   Height:    5\' 2"  (1.575 m)  Weight:    89.041 kg (196 lb 4.8 oz)  SpO2: 95% 100% 96% 100%   General: Not in acute distress, dry mucous and membrane HEENT:       Eyes: PERRL, EOMI, no scleral icterus       ENT: No discharge from the ears and  nose, no pharynx injection, no tonsillar enlargement.        Neck: No JVD, no bruit, no mass felt. Cardiac: S1/S2, RRR, No murmurs, No gallops or rubs Pulm: Good air movement bilaterally. Clear to auscultation bilaterally. No rales, wheezing, rhonchi or rubs. Abd: Soft, nondistended, diffused tender, no rebound pain, no organomegaly, BS present. There is a large ventral hernia Ext: Trace leg edema bilaterally. 2+DP/PT pulse bilaterally Musculoskeletal: No joint deformities, erythema, or stiffness, ROM full Skin: No rashes.  Neuro: Alert and oriented X3, cranial nerves II-XII grossly intact, muscle strength 5/5 in all extremeties, sensation to light touch intact.  Psych: Patient is not psychotic, no suicidal or hemocidal ideation.  Labs on Admission:  Basic Metabolic Panel:  Recent Labs Lab 08/19/14 0223  NA 131*  K 3.6  CL 95*  CO2 26  GLUCOSE 392*  BUN 14  CREATININE 0.78  CALCIUM 8.7   Liver Function Tests:  Recent Labs Lab 08/19/14 0223  AST 21  ALT 14  ALKPHOS 113  BILITOT 0.9  PROT 7.2  ALBUMIN 3.8    Recent Labs Lab 08/19/14 0223  LIPASE 24   No results for input(s): AMMONIA in the last 168 hours. CBC:  Recent Labs Lab 08/19/14 0223  WBC 13.3*  NEUTROABS 11.3*  HGB 13.0  HCT 41.6  MCV 84.2  PLT 357   Cardiac Enzymes:  Recent Labs Lab 08/19/14 0518  TROPONINI 1.08*    BNP (last 3 results)  Recent Labs  05/28/14 0246  BNP 19.4    ProBNP (last 3 results) No results for input(s): PROBNP in the last 8760 hours.  CBG: No results for input(s): GLUCAP in the last 168 hours.  Radiological Exams on Admission: Dg Chest 2 View  08/19/2014   CLINICAL DATA:  Chest pain, nausea and vomiting  EXAM: CHEST  2 VIEW  COMPARISON:  06/11/2014  FINDINGS: There is mild vascular prominence without interstitial or alveolar edema. There are no airspace opacities. There are no effusions. There is chronic compression of a lower thoracic vertebral body,  unchanged.  IMPRESSION: Mild vascular congestion without interstitial or alveolar  edema.   Electronically Signed   By: Andreas Newport M.D.   On: 08/19/2014 02:51   Ct Abdomen Pelvis W Contrast  08/19/2014   CLINICAL DATA:  Chest pain, nausea, vomiting, diarrhea for 5 hours.  EXAM: CT ABDOMEN AND PELVIS WITH CONTRAST  TECHNIQUE: Multidetector CT imaging of the abdomen and pelvis was performed using the standard protocol following bolus administration of intravenous contrast.  CONTRAST:  1mL OMNIPAQUE IOHEXOL 300 MG/ML SOLN, 119mL OMNIPAQUE IOHEXOL 300 MG/ML SOLN  COMPARISON:  01/24/2009  FINDINGS: There is a high-grade obstruction of the mid jejunum, with marked dilatation of bowel proximal to the transition point. The transition point is in the mid abdomen just to the left of midline, probably in close proximity to the roux-en-Y anastomosis. There is a large ventral hernia in the same area, but the hernia does not appear to be related to the obstruction. The distal jejunum and the entire ileum are decompressed. The colon is decompressed. There is no extraluminal air. There are unremarkable appearances of the gastrojejunostomy.  There are normal appearances of the liver with the exception of a tiny benign hypodensity which is unchanged from 2010. There are normal appearances of the spleen, pancreas, adrenals and kidneys with the exception of a few benign appearing renal cysts. The abdominal aorta is normal in caliber with mild atherosclerotic calcification. Gallbladder and bile ducts are remarkable only for a few small calculi in the gallbladder lumen. There is no bile duct dilatation.  There is hysterectomy.  No significant abnormality is evident in the lower chest.  IMPRESSION: *High-grade small bowel obstruction in the mid jejunum, most likely due to adhesion. *Large ventral hernia which is not the etiology of the bowel obstruction. *Unremarkable gastric jejunostomy *Cholelithiasis   Electronically Signed    By: Andreas Newport M.D.   On: 08/19/2014 04:48    EKG: Independently reviewed. QTc=486  Assessment/Plan Principal Problem:   SBO (small bowel obstruction) Active Problems:   Obesity   Depression   Stress incontinence, female   Lumbago   DM2 (diabetes mellitus, type 2)   HLD (hyperlipidemia)   Chest pain   Essential hypertension, benign   Chronic pain syndrome   Chest pressure  High grade small bowel obstruction: As evidenced by the CT scan. Patient is not obviously septic now. She has constant moderate pain, nausea and vomiting. Given her hx of gastric bypass, which put pt at risk of delevopping internal hernia,  will call surgeon now by ED.  -will admit to tele bed given chest pain -NPO -Pain control with the when necessary Dilaudid -Hydroxyzine for nausea (patient had QTc 483, not good candidate for Zofran). -hold diuretics, and will give IV fluid: NS 100 mL per hour -inr/ptt -type and screen  Chest pain: patient has history of stent placement. Currently with mild chest pain. Troponin is elevated at 1.08 -ASA, prn SL NGT, -start Lipitor -IV Metoprol prn -will hold IV heparin now since patient may need surgery for SBO -consulted to cardiology  Diabetes mellitus: A1c was 8.8 on 05/31/14. Patient is on glipizide and humulin at home -will switch to lautus 5 units daily  -ssi  HTN:  -Hold diovan-HCTZ and lasix -iv hydralazine prn  -IV metoprolol  GERD: -IV protonix  Depression: Stable, no suicidal homicidal ideations -continue Celexa  Dysuria: UA negative. -will get urine culture   DVT ppx: SQ Heparin       Code Status: partial (OK with cpr, but not ventilator) Family Communication:   Yes, patient's  husband    at bed side Disposition Plan: Admit to inpatient   Date of Service 08/19/2014    Ivor Costa [facility] Pager [phone]  If 7PM-7AM, please contact night-coverage [email or web address] Password [email or web address] 08/19/2014, 7:05 AM

## 2014-08-19 NOTE — ED Notes (Signed)
Patient went to xray.  I will collect labs when she returns. 

## 2014-08-19 NOTE — Progress Notes (Signed)
[facility] PROGRESS NOTE  [name] ZOX:[id number] DOB: 05-Mar-1941 DOA: 08/19/2014 PCP: [name], MD  Assessment/Plan: 1. Small bowel obstruction. -Patient with history of gastric bypass surgery presenting with CT evidence of high-grade small bowel obstruction. During my evaluation she reported abdominal pain significantly improved with resolution to her nausea and vomiting. -Gen. surgery consulted, did not feel this was related to internal hernia and bowel ischemia -Clonic keep her nothing by mouth, provide IV fluids, repeat abdominal films. -Of note she reported having a history of small bowel obstruction that resolved with conservative management.  2.  Chest pain. -Patient having an established history of coronary artery disease, status post percutaneous intervention -Labs revealed a troponin of 1.08, remains stable at 1.0 on repeat. -During my counter she is chest pain-free -Cardiology was consulted. Continue aspirin, statin, beta blocker  3.  Type 2 diabetes mellitus -Having hemoglobin A1c of 8.8 on 05/31/2014 -Patient hyperglycemic with blood sugars in the 300s. Her oral hypoglycemic agents held as patient is currently nothing by mouth. -Will continue Accu-Cheks every 4 hours with sliding scale coverage, along with Lantus 5 units sub-cutaneous daily  4.  Hypertension. -Her blood pressures are currently stable, last blood pressure 123/71 -She is on Lopressor 2.5 mg IV every 3 hours as needed  Code Status: Partial code Family Communication: Disposition Plan: Keep patient nothing by mouth, provide IV fluids, anticipate her going home once medically stable   Consultants:  General surgery  Cardiology  HPI/Subjective: Patient is a pleasant 74 year old female with a past medical history of gastric bypass surgery approximately 10 years ago, presented to the emergency room on 08/19/2014 with complaints of nausea vomiting associated with abdominal pain. Workup  included a CT scan of abdomen and pelvis with IV contrast which showed a high-grade small bowel obstruction in the mid jejunum, radiology felt most likely related to adhesion. She was also found to have a large ventral hernia. Gen. surgery was consulted, patient made nothing by mouth and started on IV fluids, admitted to [place].  Objective: Filed Vitals:   08/19/14 0643  BP: 123/71  Pulse: 91  Temp: 97.9 F (36.6 C)  Resp: 20   No intake or output data in the 24 hours ending 08/19/14 1208 Filed Weights   08/19/14 0643  Weight: 89.041 kg (196 lb 4.8 oz)    Exam:   General:  Patient is awake, alert, no acute distress, states feeling better, denies further episodes of nausea vomiting or abdominal pain  Cardiovascular: Regular rate and rhythm normal S1-S2  Respiratory: Clear to auscultation bilaterally  Abdomen: Soft, having benign abdominal exam, nontender nondistended  Musculoskeletal: No edema  Data Reviewed: Basic Metabolic Panel:  Recent Labs Lab 08/19/14 0223  NA 131*  K 3.6  CL 95*  CO2 26  GLUCOSE 392*  BUN 14  CREATININE 0.78  CALCIUM 8.7   Liver Function Tests:  Recent Labs Lab 08/19/14 0223  AST 21  ALT 14  ALKPHOS 113  BILITOT 0.9  PROT 7.2  ALBUMIN 3.8    Recent Labs Lab 08/19/14 0223  LIPASE 24   No results for input(s): AMMONIA in the last 168 hours. CBC:  Recent Labs Lab 08/19/14 0223  WBC 13.3*  NEUTROABS 11.3*  HGB 13.0  HCT 41.6  MCV 84.2  PLT 357   Cardiac Enzymes:  Recent Labs Lab 08/19/14 0518 08/19/14 0744  TROPONINI 1.08* 1.00*   BNP (last 3 results)  Recent Labs  05/28/14 0246 08/19/14 0744  BNP  19.4 363.4*    ProBNP (last 3 results) No results for input(s): PROBNP in the last 8760 hours.  CBG:  Recent Labs Lab 08/19/14 0810  GLUCAP 372*    No results found for this or any previous visit (from the past 240 hour(s)).   Studies: Dg Chest 2 View  08/19/2014   CLINICAL DATA:  Chest pain,  nausea and vomiting  EXAM: CHEST  2 VIEW  COMPARISON:  06/11/2014  FINDINGS: There is mild vascular prominence without interstitial or alveolar edema. There are no airspace opacities. There are no effusions. There is chronic compression of a lower thoracic vertebral body, unchanged.  IMPRESSION: Mild vascular congestion without interstitial or alveolar edema.   Electronically Signed   By: Andreas Newport M.D.   On: 08/19/2014 02:51   Ct Abdomen Pelvis W Contrast  08/19/2014   CLINICAL DATA:  Chest pain, nausea, vomiting, diarrhea for 5 hours.  EXAM: CT ABDOMEN AND PELVIS WITH CONTRAST  TECHNIQUE: Multidetector CT imaging of the abdomen and pelvis was performed using the standard protocol following bolus administration of intravenous contrast.  CONTRAST:  6mL OMNIPAQUE IOHEXOL 300 MG/ML SOLN, 186mL OMNIPAQUE IOHEXOL 300 MG/ML SOLN  COMPARISON:  01/24/2009  FINDINGS: There is a high-grade obstruction of the mid jejunum, with marked dilatation of bowel proximal to the transition point. The transition point is in the mid abdomen just to the left of midline, probably in close proximity to the roux-en-Y anastomosis. There is a large ventral hernia in the same area, but the hernia does not appear to be related to the obstruction. The distal jejunum and the entire ileum are decompressed. The colon is decompressed. There is no extraluminal air. There are unremarkable appearances of the gastrojejunostomy.  There are normal appearances of the liver with the exception of a tiny benign hypodensity which is unchanged from 2010. There are normal appearances of the spleen, pancreas, adrenals and kidneys with the exception of a few benign appearing renal cysts. The abdominal aorta is normal in caliber with mild atherosclerotic calcification. Gallbladder and bile ducts are remarkable only for a few small calculi in the gallbladder lumen. There is no bile duct dilatation.  There is hysterectomy.  No significant abnormality is  evident in the lower chest.  IMPRESSION: *High-grade small bowel obstruction in the mid jejunum, most likely due to adhesion. *Large ventral hernia which is not the etiology of the bowel obstruction. *Unremarkable gastric jejunostomy *Cholelithiasis   Electronically Signed   By: Andreas Newport M.D.   On: 08/19/2014 04:48   Dg Abd 2 Views  08/19/2014   CLINICAL DATA:  PT C/O CONTINUED ABD DISCOMFORT, WITH SOME RELIEF, NO BM X 1 DAY. PT REPORTS SHE CAME TO ED YESTERDAY FOR N/V, ABD CRAMPING, DIAGNOSED/ADMITTED FOR SBO  EXAM: ABDOMEN - 2 VIEW  COMPARISON:  CT, 07/2014 at 4:02 a.m.  FINDINGS: Dilated loops of central small bowel are noted in the left mid abdomen. Bowel dilation may be minimally improved. It has not worsened. Contrast is seen more distal small bowel, mostly in the pelvis.  No free air.  Hernia mesh overlies much of the right abdomen. Soft tissues otherwise unremarkable.  IMPRESSION: 1. There still prominent loops of small bowel in the left mid abdomen. The degree of dilation may be minimally improved. Contrast is also extended from the more proximal small bowel into the distal small bowel. Findings remain consistent with a partial small bowel obstruction. 2. No free air.   Electronically Signed   By: Shanon Brow  Ormond M.D.   On: 08/19/2014 09:44    Scheduled Meds: . aspirin EC  81 mg Oral Daily  . atorvastatin  40 mg Oral q1800  . cholecalciferol  5,000 Units Oral Daily  . citalopram  40 mg Oral Daily  . heparin  5,000 Units Subcutaneous 3 times per day  . insulin aspart  0-15 Units Subcutaneous 6 times per day  . insulin glargine  5 Units Subcutaneous Daily  . loratadine  10 mg Oral Daily  . magnesium oxide  400 mg Oral Daily  . pantoprazole (PROTONIX) IV  40 mg Intravenous Q24H  . sodium chloride  3 mL Intravenous Q12H   Continuous Infusions: . sodium chloride 100 mL/hr at 08/19/14 0702    Principal Problem:   SBO (small bowel obstruction) Active Problems:   Obesity    Depression   Stress incontinence, female   Lumbago   DM2 (diabetes mellitus, type 2)   HLD (hyperlipidemia)   Chest pain   Essential hypertension, benign   Chronic pain syndrome   Chest pressure    Time spent:     Kelvin Cellar  [facility] Pager [phone]. If 7PM-7AM, please contact night-coverage at [email or web address], password [facility] 08/19/2014, 12:08 PM  LOS: 0 days

## 2014-08-19 NOTE — ED Notes (Signed)
Placed on 2L O2 via [place]. Pt's O2 sats dropped after pain medication.

## 2014-08-19 NOTE — Progress Notes (Signed)
Echocardiogram 2D Echocardiogram has been performed.  [name] 08/19/2014, 6:42 PM

## 2014-08-19 NOTE — Consult Note (Signed)
Admit date: 08/19/2014 Referring Physician  Dr. Blaine Hamper Primary Physician  None listed Primary Cardiologist  Dr. Wynonia Lawman Reason for Consultation  Chest pain and elevated trop  HPI: This is a 74 year old female with a surgical history significant for laparoscopic Roux-en-Y gastric bypass 10 years ago. She had a postoperative complication that is not clear from her history, possible leak, and was reexplored with an open incision one day after her bypass. She subsequently developed a large upper abdominal ventral hernia which was repaired on 2 separate occasions with recurrence both times. The patient states that she has had 1 previous episode of bowel obstruction for which she was admitted and it resolved nonoperatively. She also has a history of CAD with cath 2004 showing calcified vessels with 70% branch vessel disease and mild CAD of the LAD, HTN, DM, dyslipidemia and Lexiscan myoview with no ischemia 04/2013.  She was in her usual state of health yesterday when she and her husband went out to eat. Soon after eating she developed the onset of pressure-like abdominal/epigastric pain around her ventral hernia and nausea with the pain radiating up into her chest. She stopped eating and went home but the pain progressed and became quite severe. She describes severe pressure mainly in her upper abdomen and epigastrium with some radiation into her anterior chest and up toward her left neck. No shortness of breath. She had multiple episodes of vomiting and then dry heaves on presenting to the emergency department. She had an episode of syncope during vomiting and was brought to the emergency room. CT scan shows evidence of high-grade bowel obstruction.  On admission Troponin was noted to be elevated at 1.08 and cardiology is now asked to consult due to some pain in her chest.  She states that the pain clearly started in her abdomen and epigastrium and she never had CP until after she started vomiting and it was  transient.  She has not had any further chest pain since admission.      PMH:   Past Medical History  Diagnosis Date  . Hypertension   . Diabetes mellitus   . High cholesterol   . Other specified disease of sebaceous glands   . Other B-complex deficiencies   . Depressive disorder, not elsewhere classified   . Tension headache   . Migraine without aura, with intractable migraine, so stated, with status migrainosus   . Type I (juvenile type) diabetes mellitus without mention of complication, uncontrolled   . Unspecified vitamin D deficiency   . Dyskinesia of esophagus   . Memory loss   . Mild cognitive impairment, so stated   . Other nonspecific abnormal serum enzyme levels   . Lipoma of other skin and subcutaneous tissue   . Pain in joint, shoulder region   . Nonspecific (abnormal) findings on radiological and other examination of abdominal area, including retroperitoneum   . Nonspecific abnormal results of liver function study   . Pain in joint, pelvic region and thigh   . Pain in joint, ankle and foot   . Edema   . Lumbago   . Other symptoms involving cardiovascular system   . Abdominal pain, unspecified site   . Other specified cardiac dysrhythmias(427.89)   . Ventral hernia, unspecified, without mention of obstruction or gangrene   . Extrinsic asthma, unspecified   . Dizziness and giddiness   . Palpitations   . Shortness of breath   . Reflux esophagitis   . Obesity, unspecified   . Unspecified hypothyroidism   .  Type I (juvenile type) diabetes mellitus without mention of complication, not stated as uncontrolled   . Other and unspecified hyperlipidemia   . Gout, unspecified   . Unspecified essential hypertension   . Encounter for long-term (current) use of other medications   . Abnormality of gait   . Chest pain, unspecified   . Obstructive chronic bronchitis with exacerbation   . Female stress incontinence   . Apnea   . Cardiomegaly   . Complication of  anesthesia     hard time waking up     PSH:   Past Surgical History  Procedure Laterality Date  . Stents      last time she said was 1 yr ago   . Breast surgery    . Appendectomy    . Abdominal hysterectomy    . Gastric bypass  2005  approx    Allergies:  Ciprofloxacin; Advair diskus; Albuterol; Alendronate sodium; Benadryl; Benzonatate; Cephalexin; Gabapentin; Iohexol; Keflex; Morphine; Other; Propoxyphene hcl; Reclast; Avapro; Codeine; Tessalon perles; and Tramadol Prior to Admit Meds:   Prescriptions prior to admission  Medication Sig Dispense Refill Last Dose  . amoxicillin (AMOXIL) 500 MG capsule 2 capsules daily. Prior to dental work  0 unknown  . aspirin 81 MG tablet Take 81 mg by mouth daily.    08/18/2014 at Unknown time  . cetirizine (ZYRTEC) 10 MG tablet Take 10 mg by mouth daily as needed for allergies. For allergies   unknown  . Cholecalciferol (VITAMIN D3) 5000 UNITS CAPS Take 1 tablet by mouth daily. For supplement   08/18/2014 at Unknown time  . citalopram (CELEXA) 40 MG tablet Take 1 tablet (40 mg total) by mouth daily. 90 tablet 3 08/18/2014 at Unknown time  . furosemide (LASIX) 40 MG tablet One each morning to treat edema (Patient taking differently: Take 40 mg by mouth daily as needed for fluid. One each morning to treat edema) 30 tablet 3 unknown  . glipiZIDE (GLUCOTROL XL) 10 MG 24 hr tablet Take 1 tablet (10 mg total) by mouth 2 (two) times daily. Take in the morning and evening to control blood sugar 180 tablet 4 08/18/2014 at Unknown time  . insulin NPH (HUMULIN N,NOVOLIN N) 100 UNIT/ML injection 25 units in the morning and 20 units in the evening to control diabetes.   08/18/2014 at Unknown time  . Magnesium Oxide 250 MG TABS Take 250 mg by mouth daily.    08/18/2014 at Unknown time  . nitroGLYCERIN (NITROSTAT) 0.4 MG SL tablet Place 1 tablet (0.4 mg total) under the tongue every 5 (five) minutes as needed. For chest pain. Maximum of 3 tablets in 15 minutes. 25 tablet 4  unknown  . PRILOSEC 40 MG capsule TAKE 1 CAPSULE TWICE DAILY FOR ACID REFLUX 180 capsule 0 08/18/2014 at Unknown time  . valsartan-hydrochlorothiazide (DIOVAN HCT) 80-12.5 MG per tablet Take 1 tablet by mouth daily. 90 tablet 3 08/18/2014 at Unknown time  . chlorpheniramine-HYDROcodone (TUSSIONEX) 10-8 MG/5ML LQCR Take 5 mLs by mouth every 12 (twelve) hours. Take scheduled for 1 week and then as needed for cough. (Patient not taking: Reported on 08/19/2014) 473 mL 0 Taking   Fam HX:    Family History  Problem Relation Age of Onset  . Stroke Mother   . Stroke Father   . Diabetes Father   . Heart disease Father   . Diabetes Son   . Cancer Brother     BLADDER  . Diabetes Brother    Social HX:  History   Social History  . Marital Status: Married    Spouse Name: N/A  . Number of Children: N/A  . Years of Education: N/A   Occupational History  . Not on file.   Social History Main Topics  . Smoking status: Never Smoker   . Smokeless tobacco: Never Used  . Alcohol Use: No  . Drug Use: No  . Sexual Activity: Not on file   Other Topics Concern  . Not on file   Social History Narrative     ROS:  All 11 ROS were addressed and are negative except what is stated in the HPI  Physical Exam: Blood pressure 123/71, pulse 91, temperature 97.9 F (36.6 C), temperature source Oral, resp. rate 20, height 5\' 2"  (1.575 m), weight 196 lb 4.8 oz (89.041 kg), SpO2 100 %.    General: Well developed, well nourished, in no acute distress Head: Eyes PERRLA, No xanthomas.   Normal cephalic and atramatic  Lungs:   Clear bilaterally to auscultation and percussion. Heart:   HRRR S1 S2 Pulses are 2+ & equal.            No carotid bruit. No JVD.  No abdominal bruits. No femoral bruits. Abdomen: Bowel sounds are positive, abdomen soft and non-tender without masses Extremities:   No clubbing, cyanosis or edema.  DP +1 Neuro: Alert and oriented X 3. Psych:  Good affect, responds  appropriately    Labs:   Lab Results  Component Value Date   WBC 13.3* 08/19/2014   HGB 13.0 08/19/2014   HCT 41.6 08/19/2014   MCV 84.2 08/19/2014   PLT 357 08/19/2014    Recent Labs Lab 08/19/14 0223  NA 131*  K 3.6  CL 95*  CO2 26  BUN 14  CREATININE 0.78  CALCIUM 8.7  PROT 7.2  BILITOT 0.9  ALKPHOS 113  ALT 14  AST 21  GLUCOSE 392*   No results found for: PTT Lab Results  Component Value Date   INR 0.99 08/19/2014   INR 1.2 01/23/2009   INR 1.0 01/12/2009   Lab Results  Component Value Date   CKTOTAL 45 06/07/2013   CKMB 0.8 01/24/2009   TROPONINI 1.00* 08/19/2014     Lab Results  Component Value Date   CHOL 155 05/29/2014   CHOL 216* 06/05/2013   Lab Results  Component Value Date   HDL 30* 05/29/2014   HDL 44 06/05/2013   Lab Results  Component Value Date   LDLCALC 86 05/29/2014   LDLCALC 136* 06/05/2013   Lab Results  Component Value Date   TRIG 193* 05/29/2014   TRIG 179* 06/05/2013   Lab Results  Component Value Date   CHOLHDL 5.2 05/29/2014   CHOLHDL 4.9* 06/05/2013   No results found for: LDLDIRECT    Radiology:  Dg Chest 2 View  08/19/2014   CLINICAL DATA:  Chest pain, nausea and vomiting  EXAM: CHEST  2 VIEW  COMPARISON:  06/11/2014  FINDINGS: There is mild vascular prominence without interstitial or alveolar edema. There are no airspace opacities. There are no effusions. There is chronic compression of a lower thoracic vertebral body, unchanged.  IMPRESSION: Mild vascular congestion without interstitial or alveolar edema.   Electronically Signed   By: Andreas Newport M.D.   On: 08/19/2014 02:51   Ct Abdomen Pelvis W Contrast  08/19/2014   CLINICAL DATA:  Chest pain, nausea, vomiting, diarrhea for 5 hours.  EXAM: CT ABDOMEN AND PELVIS WITH CONTRAST  TECHNIQUE:  Multidetector CT imaging of the abdomen and pelvis was performed using the standard protocol following bolus administration of intravenous contrast.  CONTRAST:  70mL  OMNIPAQUE IOHEXOL 300 MG/ML SOLN, 159mL OMNIPAQUE IOHEXOL 300 MG/ML SOLN  COMPARISON:  01/24/2009  FINDINGS: There is a high-grade obstruction of the mid jejunum, with marked dilatation of bowel proximal to the transition point. The transition point is in the mid abdomen just to the left of midline, probably in close proximity to the roux-en-Y anastomosis. There is a large ventral hernia in the same area, but the hernia does not appear to be related to the obstruction. The distal jejunum and the entire ileum are decompressed. The colon is decompressed. There is no extraluminal air. There are unremarkable appearances of the gastrojejunostomy.  There are normal appearances of the liver with the exception of a tiny benign hypodensity which is unchanged from 2010. There are normal appearances of the spleen, pancreas, adrenals and kidneys with the exception of a few benign appearing renal cysts. The abdominal aorta is normal in caliber with mild atherosclerotic calcification. Gallbladder and bile ducts are remarkable only for a few small calculi in the gallbladder lumen. There is no bile duct dilatation.  There is hysterectomy.  No significant abnormality is evident in the lower chest.  IMPRESSION: *High-grade small bowel obstruction in the mid jejunum, most likely due to adhesion. *Large ventral hernia which is not the etiology of the bowel obstruction. *Unremarkable gastric jejunostomy *Cholelithiasis   Electronically Signed   By: Andreas Newport M.D.   On: 08/19/2014 04:48    EKG:  NSR with ? ST elevation in the lateral leads but there is a lot of baseline wandering artifact.   ASSESSMENT:  1.  Chest pain that I suspect is related to her SBO.  Her pain clearly started after she had vomited several times.  She was also having epigastric pain but this originated in her upper abdomen and radiated into her epigastric area and chest.  She has not had any further CP.  She denies any exertional CP or DOE at home with  daily activities but dose not get much aerobic exercise.  Her initial troponin is elevated but I suspect this is more due to demand ischemia in the setting of SBO with vomiting.  Would continue to cycle cardiac enzymes to see the trend.  I will get a 2D echo to assess LVF and RWMA's (echo 05/2014 normal).  Her initial EKG showed ? Minimal ST elevation in I and avl but there was a lot of wandering baseline artifact.  I will repeat an EKG.  Nuclear stress test 04/2013 showed no ischemia. Continue ASA/statin  2.  HTN - controlled  3.  Type I DM - per IM  4.  Small bowel obstruction - abdominal pain has significantly improved.  She just came back from abdominal xray - further w/u per surgery  PLAN:    Sueanne Margarita, MD  08/19/2014  9:07 AM

## 2014-08-19 NOTE — ED Notes (Signed)
Pt presents from home via EMS with c/o chest pain, N/V/D. Pt reports she has been having these symptoms for approx 5 hours. Pt initially a 6 for EMS. Pt took aspirin prior to EMS arrival. Pt has a 20 g right AC placed by EMS, 4 mg zofran given en route.

## 2014-08-19 NOTE — Consult Note (Signed)
Reason for Consult: Small bowel obstruction  Referring Physician: Kadin Bera is an 74 y.o. female.   HPI: I was asked to evaluate this patient for small bowel obstruction. She is a 74 year old female with a surgical history significant for laparoscopic Roux-en-Y gastric bypass 10 years ago. She had a postoperative complication that is not clear from her history, possible leak, and was reexplored with an open incision one day after her bypass. She subsequently developed a large upper abdominal ventral hernia which was repaired on 2 separate occasions with recurrence both times. The patient states that she has had 1 previous episode of bowel obstruction for which she was admitted and it resolved nonoperatively. She was in her usual state of health yesterday when she and her husband went out to eat. Soon after eating she developed the onset of pressure-like epigastric pain and nausea with the pain radiating up into her chest. She stopped eating and went home but the pain progressed and became quite severe. She describes severe pressure mainly in her epigastrium  And there was also some radiation into her anterior chest and up toward her left neck. No shortness of breath. She had multiple episodes of vomiting and then dry heaves on presenting to the emergency department. She had an episode of syncope during vomiting and was brought to the emergency room. CT scan as below which I personally reviewed shows evidence of high-grade bowel obstruction. I do not see any clear signs of internal hernia such as swirling mesentery. Currently is I'm seeing the patient this morning she seems to be feeling significantly better. She denies pain at present but has had some pain medication. No nausea at present. She is very alert and comfortable appearing.  Past Medical History  Diagnosis Date  . Hypertension   . Diabetes mellitus   . High cholesterol   . Other specified disease of sebaceous glands   . Other  B-complex deficiencies   . Depressive disorder, not elsewhere classified   . Tension headache   . Migraine without aura, with intractable migraine, so stated, with status migrainosus   . Type I (juvenile type) diabetes mellitus without mention of complication, uncontrolled   . Unspecified vitamin D deficiency   . Dyskinesia of esophagus   . Memory loss   . Mild cognitive impairment, so stated   . Other nonspecific abnormal serum enzyme levels   . Lipoma of other skin and subcutaneous tissue   . Pain in joint, shoulder region   . Nonspecific (abnormal) findings on radiological and other examination of abdominal area, including retroperitoneum   . Nonspecific abnormal results of liver function study   . Pain in joint, pelvic region and thigh   . Pain in joint, ankle and foot   . Edema   . Lumbago   . Other symptoms involving cardiovascular system   . Abdominal pain, unspecified site   . Other specified cardiac dysrhythmias(427.89)   . Ventral hernia, unspecified, without mention of obstruction or gangrene   . Extrinsic asthma, unspecified   . Dizziness and giddiness   . Palpitations   . Shortness of breath   . Reflux esophagitis   . Obesity, unspecified   . Unspecified hypothyroidism   . Type I (juvenile type) diabetes mellitus without mention of complication, not stated as uncontrolled   . Other and unspecified hyperlipidemia   . Gout, unspecified   . Unspecified essential hypertension   . Encounter for long-term (current) use of other medications   .  Abnormality of gait   . Chest pain, unspecified   . Obstructive chronic bronchitis with exacerbation   . Female stress incontinence   . Apnea   . Cardiomegaly   . Complication of anesthesia     hard time waking up    Past Surgical History  Procedure Laterality Date  . Stents      last time she said was 1 yr ago   . Breast surgery    . Appendectomy    . Abdominal hysterectomy    . Gastric bypass  2005  approx     Family History  Problem Relation Age of Onset  . Stroke Mother   . Stroke Father   . Diabetes Father   . Heart disease Father   . Diabetes Son   . Cancer Brother     BLADDER  . Diabetes Brother     Social History:  reports that she has never smoked. She has never used smokeless tobacco. She reports that she does not drink alcohol or use illicit drugs.  Allergies:  Allergies  Allergen Reactions  . Ciprofloxacin Rash  . Advair Diskus [Fluticasone-Salmeterol] Other (See Comments)    Throat closes  . Albuterol     REACTION: closes throat  . Alendronate Sodium Itching  . Benadryl [Diphenhydramine Hcl]   . Benzonatate     REACTION: rash/hives  . Cephalexin Nausea Only  . Gabapentin Other (See Comments)    Loss of memory  . Iohexol      Desc: rash and DIF BREATHING   . Keflex [Cephalexin]   . Morphine Nausea And Vomiting  . Other     Decongestants  . Propoxyphene Hcl   . Reclast [Zoledronic Acid] Other (See Comments)    Chest pain  . Avapro [Irbesartan] Rash  . Codeine Nausea And Vomiting, Swelling and Rash  . Tessalon Perles Rash  . Tramadol Nausea Only and Rash    Current Facility-Administered Medications  Medication Dose Route Frequency Provider Last Rate Last Dose  . 0.9 %  sodium chloride infusion   Intravenous Continuous Ivor Costa, MD 100 mL/hr at 08/19/14 0702    . acetaminophen (TYLENOL) tablet 650 mg  650 mg Oral Q6H PRN Ivor Costa, MD       Or  . acetaminophen (TYLENOL) suppository 650 mg  650 mg Rectal Q6H PRN Ivor Costa, MD      . aspirin EC tablet 81 mg  81 mg Oral Daily Ivor Costa, MD      . atorvastatin (LIPITOR) tablet 40 mg  40 mg Oral q1800 Ivor Costa, MD      . cholecalciferol (VITAMIN D) tablet 5,000 Units  5,000 Units Oral Daily Ivor Costa, MD      . citalopram (CELEXA) tablet 40 mg  40 mg Oral Daily Ivor Costa, MD      . heparin injection 5,000 Units  5,000 Units Subcutaneous 3 times per day Ivor Costa, MD   5,000 Units at 08/19/14 0800  .  hydrALAZINE (APRESOLINE) injection 5 mg  5 mg Intravenous Q2H PRN Ivor Costa, MD      . hydrOXYzine (VISTARIL) injection 25 mg  25 mg Intramuscular Q4H PRN Ivor Costa, MD      . insulin aspart (novoLOG) injection 0-9 Units  0-9 Units Subcutaneous TID WC Ivor Costa, MD      . insulin glargine (LANTUS) injection 5 Units  5 Units Subcutaneous Daily Ivor Costa, MD      . loratadine (CLARITIN) tablet 10 mg  10  mg Oral Daily Ivor Costa, MD      . magnesium oxide (MAG-OX) tablet 400 mg  400 mg Oral Daily Ivor Costa, MD      . metoprolol (LOPRESSOR) injection 2.5 mg  2.5 mg Intravenous Q3H PRN Ivor Costa, MD      . nitroGLYCERIN (NITROSTAT) SL tablet 0.4 mg  0.4 mg Sublingual Q5 min PRN Ivor Costa, MD      . ondansetron [facility]) injection 4 mg  4 mg Intravenous Q8H PRN Kaitlyn Szekalski, PA-C      . pantoprazole (PROTONIX) injection 40 mg  40 mg Intravenous Q24H Ivor Costa, MD   40 mg at 08/19/14 0702  . sodium chloride 0.9 % injection 3 mL  3 mL Intravenous Q12H Ivor Costa, MD         Results for orders placed or performed during the hospital encounter of 08/19/14 (from the past 48 hour(s))  Urinalysis, Routine w reflex microscopic     Status: Abnormal   Collection Time: 08/19/14  2:04 AM  Result Value Ref Range   Color, Urine YELLOW YELLOW   APPearance CLOUDY (A) CLEAR   Specific Gravity, Urine 1.021 1.005 - 1.030   pH 6.5 5.0 - 8.0   Glucose, UA >1000 (A) NEGATIVE mg/dL   Hgb urine dipstick TRACE (A) NEGATIVE   Bilirubin Urine NEGATIVE NEGATIVE   Ketones, ur NEGATIVE NEGATIVE mg/dL   Protein, ur 100 (A) NEGATIVE mg/dL   Urobilinogen, UA 0.2 0.0 - 1.0 mg/dL   Nitrite NEGATIVE NEGATIVE   Leukocytes, UA NEGATIVE NEGATIVE  Urine microscopic-add on     Status: Abnormal   Collection Time: 08/19/14  2:04 AM  Result Value Ref Range   Squamous Epithelial / LPF FEW (A) RARE   WBC, UA 0-2 <3 WBC/hpf   RBC / HPF 0-2 <3 RBC/hpf   Bacteria, UA RARE RARE  CBC with Differential/Platelet     Status: Abnormal    Collection Time: 08/19/14  2:23 AM  Result Value Ref Range   WBC 13.3 (H) 4.0 - 10.5 K/uL   RBC 4.94 3.87 - 5.11 MIL/uL   Hemoglobin 13.0 12.0 - 15.0 g/dL   HCT 41.6 36.0 - 46.0 %   MCV 84.2 78.0 - 100.0 fL   MCH 26.3 26.0 - 34.0 pg   MCHC 31.3 30.0 - 36.0 g/dL   RDW 15.4 11.5 - 15.5 %   Platelets 357 150 - 400 K/uL   Neutrophils Relative % 85 (H) 43 - 77 %   Neutro Abs 11.3 (H) 1.7 - 7.7 K/uL   Lymphocytes Relative 11 (L) 12 - 46 %   Lymphs Abs 1.4 0.7 - 4.0 K/uL   Monocytes Relative 4 3 - 12 %   Monocytes Absolute 0.6 0.1 - 1.0 K/uL   Eosinophils Relative 0 0 - 5 %   Eosinophils Absolute 0.0 0.0 - 0.7 K/uL   Basophils Relative 0 0 - 1 %   Basophils Absolute 0.0 0.0 - 0.1 K/uL  Lipase, blood     Status: None   Collection Time: 08/19/14  2:23 AM  Result Value Ref Range   Lipase 24 11 - 59 U/L  Comprehensive metabolic panel     Status: Abnormal   Collection Time: 08/19/14  2:23 AM  Result Value Ref Range   Sodium 131 (L) 135 - 145 mmol/L   Potassium 3.6 3.5 - 5.1 mmol/L   Chloride 95 (L) 96 - 112 mmol/L   CO2 26 19 - 32 mmol/L   Glucose,  Bld 392 (H) 70 - 99 mg/dL   BUN 14 6 - 23 mg/dL   Creatinine, Ser 0.78 0.50 - 1.10 mg/dL   Calcium 8.7 8.4 - 10.5 mg/dL   Total Protein 7.2 6.0 - 8.3 g/dL   Albumin 3.8 3.5 - 5.2 g/dL   AST 21 0 - 37 U/L   ALT 14 0 - 35 U/L   Alkaline Phosphatase 113 39 - 117 U/L   Total Bilirubin 0.9 0.3 - 1.2 mg/dL   GFR calc non Af Amer 79 (L) >90 mL/min   GFR calc Af Amer >90 >90 mL/min    Comment: (NOTE) The eGFR has been calculated using the CKD EPI equation. This calculation has not been validated in all clinical situations. eGFR's persistently <90 mL/min signify possible Chronic Kidney Disease.    Anion gap 10 5 - 15  Troponin I     Status: Abnormal   Collection Time: 08/19/14  5:18 AM  Result Value Ref Range   Troponin I 1.08 (HH) <0.031 ng/mL    Comment:        POSSIBLE MYOCARDIAL ISCHEMIA. SERIAL TESTING RECOMMENDED. CRITICAL  RESULT CALLED TO, READ BACK BY AND VERIFIED WITH: POSTEN,E/ED _0  ON 08/19/14 BY KARCZEWSKI,S.     Dg Chest 2 View  08/19/2014   CLINICAL DATA:  Chest pain, nausea and vomiting  EXAM: CHEST  2 VIEW  COMPARISON:  06/11/2014  FINDINGS: There is mild vascular prominence without interstitial or alveolar edema. There are no airspace opacities. There are no effusions. There is chronic compression of a lower thoracic vertebral body, unchanged.  IMPRESSION: Mild vascular congestion without interstitial or alveolar edema.   Electronically Signed   By: Andreas Newport M.D.   On: 08/19/2014 02:51   Ct Abdomen Pelvis W Contrast  08/19/2014   CLINICAL DATA:  Chest pain, nausea, vomiting, diarrhea for 5 hours.  EXAM: CT ABDOMEN AND PELVIS WITH CONTRAST  TECHNIQUE: Multidetector CT imaging of the abdomen and pelvis was performed using the standard protocol following bolus administration of intravenous contrast.  CONTRAST:  46m OMNIPAQUE IOHEXOL 300 MG/ML SOLN, 1033mOMNIPAQUE IOHEXOL 300 MG/ML SOLN  COMPARISON:  01/24/2009  FINDINGS: There is a high-grade obstruction of the mid jejunum, with marked dilatation of bowel proximal to the transition point. The transition point is in the mid abdomen just to the left of midline, probably in close proximity to the roux-en-Y anastomosis. There is a large ventral hernia in the same area, but the hernia does not appear to be related to the obstruction. The distal jejunum and the entire ileum are decompressed. The colon is decompressed. There is no extraluminal air. There are unremarkable appearances of the gastrojejunostomy.  There are normal appearances of the liver with the exception of a tiny benign hypodensity which is unchanged from 2010. There are normal appearances of the spleen, pancreas, adrenals and kidneys with the exception of a few benign appearing renal cysts. The abdominal aorta is normal in caliber with mild atherosclerotic calcification. Gallbladder and bile  ducts are remarkable only for a few small calculi in the gallbladder lumen. There is no bile duct dilatation.  There is hysterectomy.  No significant abnormality is evident in the lower chest.  IMPRESSION: *High-grade small bowel obstruction in the mid jejunum, most likely due to adhesion. *Large ventral hernia which is not the etiology of the bowel obstruction. *Unremarkable gastric jejunostomy *Cholelithiasis   Electronically Signed   By: DaAndreas Newport.D.   On: 08/19/2014 04:48    Review  of Systems  Constitutional: Negative for fever, chills and weight loss.  HENT: Positive for congestion.   Respiratory: Positive for cough. Negative for sputum production, shortness of breath and wheezing.   Cardiovascular: Positive for chest pain. Negative for palpitations.  Gastrointestinal: Positive for nausea, vomiting and abdominal pain. Negative for diarrhea, constipation and blood in stool.  Musculoskeletal: Positive for joint pain.   Blood pressure 123/71, pulse 91, temperature 97.9 F (36.6 C), temperature source Oral, resp. rate 20, height _0  (1.575 m), weight 89.041 kg (196 lb 4.8 oz), SpO2 100 %. Physical Exam General: Alert, Obese Caucasian female, in no distress Skin: Warm and dry without rash or infection. HEENT: No palpable masses or thyromegaly. Sclera nonicteric. Pupils equal round and reactive. Oropharynx clear. Lymph nodes: No cervical, supraclavicular, or inguinal nodes palpable. Lungs: Breath sounds clear and equal without increased work of breathing Cardiovascular: Regular rate and rhythm without murmur. No JVD or edema.  Abdomen: Healed long midline incision with large diffuse upper abdominal hernia. Moderate upper abdominal distention with some tympany.. Mild upper abdominal tenderness but soft and no guarding.. No masses palpable. No organomegaly.  Extremities: No edema or joint swelling or deformity. No chronic venous stasis changes. Resolving hematoma with some induration  and discoloration along her left medial calf Neurologic: Alert and fully oriented. No gross motor or sensory deficits  Assessment/Plan: Small bowel obstruction in patient with history of gastric bypass 10 years ago. Typically, in patients with evidence of small bowel obstruction and history of laparoscopic Roux-en-Y gastric bypass would recommend immediate surgery due to the risk of internal hernia and bowel ischemia. Her situation is somewhat different in that she essentially had open surgery with reexploration one day following her bypass and has had 2 previous ventral hernia repairs. She likely has significant abdominal adhesions which would put her at less risk for internal hernia with ischemia. She also gives a history of small bowel obstruction that resolved spontaneously. Currently as I'm seeing her she seems very comfortable and is minimally tender with a soft abdomen and really seems quite a bit improved from her presentation. She also complains of chest pain, has a history of coronary artery disease and elevated troponins. Cardiology consult is pending. She could be having cardiac ischemia that could put her at very high risk for surgery. With all these factors I think a period of close observation would be safest. I'm going to repeat plain abdominal x-rays later today to see if there is progression of the contrast. Await cardiology evaluation. Follow closely for now.  Khole Branch T 08/19/2014, 7:58 AM

## 2014-08-19 NOTE — ED Notes (Signed)
Patient transported to CT 

## 2014-08-20 ENCOUNTER — Inpatient Hospital Stay (HOSPITAL_COMMUNITY): Payer: Medicare Other

## 2014-08-20 ENCOUNTER — Encounter (HOSPITAL_COMMUNITY): Payer: Self-pay | Admitting: Surgery

## 2014-08-20 DIAGNOSIS — K432 Incisional hernia without obstruction or gangrene: Secondary | ICD-10-CM

## 2014-08-20 DIAGNOSIS — K5669 Other intestinal obstruction: Secondary | ICD-10-CM

## 2014-08-20 LAB — COMPREHENSIVE METABOLIC PANEL
ALT: 13 U/L (ref 0–35)
AST: 24 U/L (ref 0–37)
Albumin: 3.1 g/dL — ABNORMAL LOW (ref 3.5–5.2)
Alkaline Phosphatase: 81 U/L (ref 39–117)
Anion gap: 9 (ref 5–15)
BILIRUBIN TOTAL: 0.7 mg/dL (ref 0.3–1.2)
BUN: 16 mg/dL (ref 6–23)
CALCIUM: 8 mg/dL — AB (ref 8.4–10.5)
CHLORIDE: 106 mmol/L (ref 96–112)
CO2: 23 mmol/L (ref 19–32)
Creatinine, Ser: 0.74 mg/dL (ref 0.50–1.10)
GFR calc Af Amer: >90 mL/min (ref >90)
GFR, EST NON AFRICAN AMERICAN: 81 mL/min — AB (ref >90)
Glucose, Bld: 136 mg/dL — ABNORMAL HIGH (ref 70–99)
Potassium: 3.9 mmol/L (ref 3.5–5.1)
SODIUM: 138 mmol/L (ref 135–145)
Total Protein: 5.7 g/dL — ABNORMAL LOW (ref 6.0–8.3)

## 2014-08-20 LAB — TROPONIN I
TROPONIN I: 0.63 ng/mL — AB (ref <0.031)
TROPONIN I: 0.75 ng/mL — AB (ref <0.031)
Troponin I: 0.57 ng/mL (ref <0.031)
Troponin I: 0.92 ng/mL (ref <0.031)

## 2014-08-20 LAB — GLUCOSE, CAPILLARY
GLUCOSE-CAPILLARY: 250 mg/dL — AB (ref 70–99)
GLUCOSE-CAPILLARY: 228 mg/dL — AB (ref 70–99)
Glucose-Capillary: 172 mg/dL — ABNORMAL HIGH (ref 70–99)
Glucose-Capillary: 141 mg/dL — ABNORMAL HIGH (ref 70–99)
Glucose-Capillary: 151 mg/dL — ABNORMAL HIGH (ref 70–99)

## 2014-08-20 LAB — URINE CULTURE: Colony Count: 40000

## 2014-08-20 LAB — CBC
HCT: 34.8 % — ABNORMAL LOW (ref 36.0–46.0)
Hemoglobin: 10.9 g/dL — ABNORMAL LOW (ref 12.0–15.0)
MCH: 26.7 pg (ref 26.0–34.0)
MCHC: 31.3 g/dL (ref 30.0–36.0)
MCV: 85.3 fL (ref 78.0–100.0)
Platelets: ADEQUATE 10*3/uL (ref 150–400)
RBC: 4.08 MIL/uL (ref 3.87–5.11)
RDW: 15.8 % — AB (ref 11.5–15.5)
WBC: 11.6 10*3/uL — AB (ref 4.0–10.5)

## 2014-08-20 LAB — MRSA PCR SCREENING: MRSA BY PCR: NEGATIVE

## 2014-08-20 MED ORDER — MAGIC MOUTHWASH: 15.0000 mL | Freq: Four times a day (QID) | ORAL | Status: DC | PRN

## 2014-08-20 MED ORDER — MORPHINE SULFATE 2 MG/ML IJ SOLN
INTRAMUSCULAR | Status: AC
  Administered 2014-08-20: 0.5 mg via INTRAVENOUS
  Filled 2014-08-20: qty 1

## 2014-08-20 MED ORDER — BISACODYL 10 MG RE SUPP
10.0000 mg | Freq: Two times a day (BID) | RECTAL | Status: DC | PRN
  Filled 2014-08-20: qty 1

## 2014-08-20 MED ORDER — MENTHOL 3 MG MT LOZG
1.0000 | LOZENGE | OROMUCOSAL | Status: DC | PRN
  Filled 2014-08-20: qty 9

## 2014-08-20 MED ORDER — LIP MEDEX EX OINT
1.0000 "application " | TOPICAL_OINTMENT | Freq: Two times a day (BID) | CUTANEOUS | Status: DC
  Administered 2014-08-20 – 2014-08-24 (×8): 1 via TOPICAL
  Filled 2014-08-20: qty 7

## 2014-08-20 MED ORDER — PHENOL 1.4 % MT LIQD
2.0000 | OROMUCOSAL | Status: DC | PRN
  Filled 2014-08-20: qty 177

## 2014-08-20 MED ORDER — HYDROMORPHONE HCL 1 MG/ML IJ SOLN: 1.0000 mg | INTRAMUSCULAR | Status: DC | PRN

## 2014-08-20 MED ORDER — ALUM & MAG HYDROXIDE-SIMETH 200-200-20 MG/5ML PO SUSP: 30.0000 mL | Freq: Four times a day (QID) | ORAL | Status: DC | PRN

## 2014-08-20 MED ORDER — HYDROMORPHONE HCL 1 MG/ML IJ SOLN: 0.5000 mg | INTRAMUSCULAR | Status: DC | PRN

## 2014-08-20 MED ORDER — HEPARIN (PORCINE) IN NACL 100-0.45 UNIT/ML-% IJ SOLN
850.0000 [IU]/h | INTRAMUSCULAR | Status: DC
  Administered 2014-08-20: 850 [IU]/h via INTRAVENOUS
  Filled 2014-08-20: qty 250

## 2014-08-20 MED ORDER — LACTATED RINGERS IV BOLUS (SEPSIS): 1000.0000 mL | Freq: Three times a day (TID) | INTRAVENOUS | Status: DC | PRN

## 2014-08-20 MED ORDER — NITROGLYCERIN IN D5W 200-5 MCG/ML-% IV SOLN
0.0000 ug/min | INTRAVENOUS | Status: DC
  Administered 2014-08-20: 5 ug/min via INTRAVENOUS
  Filled 2014-08-20: qty 250

## 2014-08-20 MED ORDER — MORPHINE SULFATE 2 MG/ML IJ SOLN: 0.5000 mg | INTRAMUSCULAR | Status: DC | PRN

## 2014-08-20 NOTE — Progress Notes (Signed)
PT Cancellation Note  Patient Details Name: [name] MRN: [id number] DOB: 10-27-1940   Cancelled Treatment:    Reason Eval/Treat Not Completed: Medical issues which prohibited therapy RN reports possible cardiac cath today at [facility].  Will await further cardiac workup and check back as schedule permits.   Kimberla Driskill,KATHrine E 08/20/2014, 9:27 AM Carmelia Bake, PT, DPT 08/20/2014 Pager: [phone]

## 2014-08-20 NOTE — Progress Notes (Signed)
OT Cancellation Note  Patient Details Name: [name] MRN: [id number] DOB: 09-12-1940   Cancelled Treatment:    Reason Eval/Treat Not Completed: Medical issues which prohibited therapy   Medical issues which prohibited therapy RN reports possible cardiac cath today at [facility]. Will await further cardiac workup and check back as schedule permits.  Betsy Pries 08/20/2014, 12:44 PM

## 2014-08-20 NOTE — Progress Notes (Signed)
[facility] PROGRESS NOTE  CAMMY SANJURJO GHW:[id number] DOB: 24-Feb-1941 DOA: 08/19/2014 PCP: [name], MD   Interim summary  Patient is a pleasant 74 year old female with a past medical history of gastric bypass surgery approximately 10 years ago, presented to the emergency room on 08/19/2014 with complaints of nausea vomiting associated with abdominal pain. Workup included a CT scan of abdomen and pelvis with IV contrast which showed a high-grade small bowel obstruction in the mid jejunum, radiology felt most likely related to adhesion. She was also found to have a large ventral hernia. Gen. surgery was consulted, patient made nothing by mouth and started on IV fluids, admitted to [place]. Patient was not felt to have internal hernia and bowel ischemia. She complained of chest pain having elevated troponin of 1.08. This is further worked up with a transthoracic echocardiogram which showed EF of 35-40% represented a significant change from previous 2-D echo performed in December 2015 at the time showing EF of 60-65%. Cardiology feeling this was most likely due to Takasubo's, however recommended cardiac catheterization which will likely be done tomorrow.   Assessment/Plan: 1. Small bowel obstruction. -Patient with history of gastric bypass surgery presenting with CT evidence of high-grade small bowel obstruction. During my evaluation she reported abdominal pain significantly improved with resolution to her nausea and vomiting. -Gen. surgery consulted, did not feel this was related to internal hernia and bowel ischemia -Abdominal x-ray performed on 08/20/2014 showing persistent dilatation of mid abdominal small bowel loops however oral contrast is now in the colon. She currently denies abdominal pain, nausea, vomiting.  2.  Chest pain. -Patient having an established history of coronary artery disease, status post percutaneous intervention -Labs revealed a troponin of 1.08, remains stable at  1.0 on repeat. -She had a transthoracic echocardiogram performed on 08/19/2014 that showed an EF of 35-40% with severe hypokinesis of the mid apical anterior, inferior myocardium. This reflects a significant change from her prior transthoracic echocardiogram performed on 05/30/2014 at which time she had an EF of 60-65% -She was evaluated by her cardiologist feeling this is most consistent with Takasubo's. Patient likely to undergo cardiac catheterization tomorrow.   3.  Type 2 diabetes mellitus -Having hemoglobin A1c of 8.8 on 12/31/201Blood sugars improving today, having CBG's in the 140s to 170s. Her oral hypoglycemic agents held as patient is currently nothing by mouth. -Will continue Accu-Cheks every 4 hours with sliding scale coverage, along with Lantus 5 units sub-cutaneous daily  4.  Hypertension. -Her blood pressures are currently stable, last blood pressure 123/71 -She is on Lopressor 2.5 mg IV every 3 hours as needed -We'll pressures are stable, last blood pressure 121/53   Code Status: Partial code Family Communication: Disposition Plan: Keep patient nothing by mouth, provide IV fluids, anticipate her going home once medically stable   Consultants:  General surgery  Cardiology  HPI/Subjective:  No acute events overnight, patient this morning states feeling a little better, had chest pain yesterday evening.    Objective: Filed Vitals:   08/20/14 0419  BP: 121/53  Pulse: 81  Temp: 98.2 F (36.8 C)  Resp: 18    Intake/Output Summary (Last 24 hours) at 08/20/14 1401 Last data filed at 08/20/14 0500  Gross per 24 hour  Intake 2196.67 ml  Output      0 ml  Net 2196.67 ml   Filed Weights   08/19/14 0643 08/20/14 0419  Weight: 89.041 kg (196 lb 4.8 oz) 88.769 kg (195 lb 11.2 oz)  Exam:   General:  Patient is awake, alert, no acute distress, states feeling better, states having chest pain overnight   Cardiovascular: Regular rate and rhythm normal  S1-S2  Respiratory: Clear to auscultation bilaterally  Abdomen: Soft, having benign abdominal exam, nontender nondistended  Musculoskeletal: No edema  Data Reviewed: Basic Metabolic Panel:  Recent Labs Lab 08/19/14 0223 08/20/14 0811  NA 131* 138  K 3.6 3.9  CL 95* 106  CO2 26 23  GLUCOSE 392* 136*  BUN 14 16  CREATININE 0.78 0.74  CALCIUM 8.7 8.0*   Liver Function Tests:  Recent Labs Lab 08/19/14 0223 08/20/14 0811  AST 21 24  ALT 14 13  ALKPHOS 113 81  BILITOT 0.9 0.7  PROT 7.2 5.7*  ALBUMIN 3.8 3.1*    Recent Labs Lab 08/19/14 0223  LIPASE 24   No results for input(s): AMMONIA in the last 168 hours. CBC:  Recent Labs Lab 08/19/14 0223 08/20/14 0230  WBC 13.3* 11.6*  NEUTROABS 11.3*  --   HGB 13.0 10.9*  HCT 41.6 34.8*  MCV 84.2 85.3  PLT 357 PLATELET CLUMPS NOTED ON SMEAR, COUNT APPEARS ADEQUATE   Cardiac Enzymes:  Recent Labs Lab 08/19/14 1153 08/19/14 1840 08/19/14 2140 08/20/14 0230 08/20/14 0811  TROPONINI 0.99* 0.75* 0.67* 0.63* 0.57*   BNP (last 3 results)  Recent Labs  05/28/14 0246 08/19/14 0744  BNP 19.4 363.4*    ProBNP (last 3 results) No results for input(s): PROBNP in the last 8760 hours.  CBG:  Recent Labs Lab 08/19/14 2030 08/19/14 2316 08/20/14 0415 08/20/14 0742 08/20/14 1206  GLUCAP 172* 156* 172* 141* 151*    No results found for this or any previous visit (from the past 240 hour(s)).   Studies: Dg Chest 2 View  08/19/2014   CLINICAL DATA:  Chest pain, nausea and vomiting  EXAM: CHEST  2 VIEW  COMPARISON:  06/11/2014  FINDINGS: There is mild vascular prominence without interstitial or alveolar edema. There are no airspace opacities. There are no effusions. There is chronic compression of a lower thoracic vertebral body, unchanged.  IMPRESSION: Mild vascular congestion without interstitial or alveolar edema.   Electronically Signed   By: Andreas Newport M.D.   On: 08/19/2014 02:51   Ct Abdomen  Pelvis W Contrast  08/19/2014   CLINICAL DATA:  Chest pain, nausea, vomiting, diarrhea for 5 hours.  EXAM: CT ABDOMEN AND PELVIS WITH CONTRAST  TECHNIQUE: Multidetector CT imaging of the abdomen and pelvis was performed using the standard protocol following bolus administration of intravenous contrast.  CONTRAST:  28mL OMNIPAQUE IOHEXOL 300 MG/ML SOLN, 138mL OMNIPAQUE IOHEXOL 300 MG/ML SOLN  COMPARISON:  01/24/2009  FINDINGS: There is a high-grade obstruction of the mid jejunum, with marked dilatation of bowel proximal to the transition point. The transition point is in the mid abdomen just to the left of midline, probably in close proximity to the roux-en-Y anastomosis. There is a large ventral hernia in the same area, but the hernia does not appear to be related to the obstruction. The distal jejunum and the entire ileum are decompressed. The colon is decompressed. There is no extraluminal air. There are unremarkable appearances of the gastrojejunostomy.  There are normal appearances of the liver with the exception of a tiny benign hypodensity which is unchanged from 2010. There are normal appearances of the spleen, pancreas, adrenals and kidneys with the exception of a few benign appearing renal cysts. The abdominal aorta is normal in caliber with mild atherosclerotic calcification. Gallbladder  and bile ducts are remarkable only for a few small calculi in the gallbladder lumen. There is no bile duct dilatation.  There is hysterectomy.  No significant abnormality is evident in the lower chest.  IMPRESSION: *High-grade small bowel obstruction in the mid jejunum, most likely due to adhesion. *Large ventral hernia which is not the etiology of the bowel obstruction. *Unremarkable gastric jejunostomy *Cholelithiasis   Electronically Signed   By: Andreas Newport M.D.   On: 08/19/2014 04:48   Dg Abd 2 Views  08/20/2014   CLINICAL DATA:  Follow up partial small bowel obstruction.  EXAM: ABDOMEN - 2 VIEW  COMPARISON:   08/19/2014  FINDINGS: All of the oral contrast has progressed through to the colon. There is moderate dilatation of mid abdominal small bowel loops without significant interval change. No free air.  IMPRESSION: Persistent dilatation of mid abdominal small bowel loops. All of the oral contrast is now in the colon.   Electronically Signed   By: Andreas Newport M.D.   On: 08/20/2014 06:24   Dg Abd 2 Views  08/19/2014   CLINICAL DATA:  PT C/O CONTINUED ABD DISCOMFORT, WITH SOME RELIEF, NO BM X 1 DAY. PT REPORTS SHE CAME TO ED YESTERDAY FOR N/V, ABD CRAMPING, DIAGNOSED/ADMITTED FOR SBO  EXAM: ABDOMEN - 2 VIEW  COMPARISON:  CT, 07/2014 at 4:02 a.m.  FINDINGS: Dilated loops of central small bowel are noted in the left mid abdomen. Bowel dilation may be minimally improved. It has not worsened. Contrast is seen more distal small bowel, mostly in the pelvis.  No free air.  Hernia mesh overlies much of the right abdomen. Soft tissues otherwise unremarkable.  IMPRESSION: 1. There still prominent loops of small bowel in the left mid abdomen. The degree of dilation may be minimally improved. Contrast is also extended from the more proximal small bowel into the distal small bowel. Findings remain consistent with a partial small bowel obstruction. 2. No free air.   Electronically Signed   By: Lajean Manes M.D.   On: 08/19/2014 09:44    Scheduled Meds: . aspirin EC  81 mg Oral Daily  . atorvastatin  40 mg Oral q1800  . cholecalciferol  5,000 Units Oral Daily  . citalopram  40 mg Oral Daily  . heparin  5,000 Units Subcutaneous 3 times per day  . insulin aspart  0-15 Units Subcutaneous 6 times per day  . insulin glargine  5 Units Subcutaneous Daily  . loratadine  10 mg Oral Daily  . magnesium oxide  400 mg Oral Daily  . pantoprazole (PROTONIX) IV  40 mg Intravenous Q24H  . sodium chloride  3 mL Intravenous Q12H   Continuous Infusions: . sodium chloride 100 mL/hr at 08/19/14 1720    Principal Problem:   SBO  (small bowel obstruction) Active Problems:   Obesity   Depression   Stress incontinence, female   Lumbago   DM2 (diabetes mellitus, type 2)   HLD (hyperlipidemia)   Chest pain   Essential hypertension, benign   Chronic pain syndrome   Elevated troponin   Chest pressure    Time spent:  35 minutes     Kelvin Cellar  [facility] Pager [phone]. If 7PM-7AM, please contact night-coverage at [email or web address], password [facility] 08/20/2014, 2:01 PM  LOS: 1 day

## 2014-08-20 NOTE — Progress Notes (Signed)
Follow up note  Called by RN patient complaining of chest pain. Assessed [name] at bedside she continues to have chest pain despite receiving sublingual NTG. I spoke with her cardiologist [name] who recommended transferring to unit and starting NTG gtt. If she continues to have CP she may need Heart Cath this evening.

## 2014-08-20 NOTE — Progress Notes (Signed)
ANTICOAGULATION CONSULT NOTE - Initial Consult  Pharmacy Consult for Heparin Indication: chest pain/ACS  Allergies  Allergen Reactions  . Ciprofloxacin Rash  . Advair Diskus [Fluticasone-Salmeterol] Other (See Comments)    Throat closes  . Albuterol     REACTION: closes throat  . Alendronate Sodium Itching  . Benadryl [Diphenhydramine Hcl]   . Benzonatate     REACTION: rash/hives  . Cephalexin Nausea Only  . Gabapentin Other (See Comments)    Loss of memory  . Iohexol      Desc: rash and DIF BREATHING   . Keflex [Cephalexin]   . Morphine Nausea And Vomiting  . Other     Decongestants  . Propoxyphene Hcl   . Reclast [Zoledronic Acid] Other (See Comments)    Chest pain  . Avapro [Irbesartan] Rash  . Codeine Nausea And Vomiting, Swelling and Rash  . Tessalon Perles Rash  . Tramadol Nausea Only and Rash    Patient Measurements: Height: 5\' 2"  (157.5 cm) Weight: 195 lb 11.2 oz (88.769 kg) IBW/kg (Calculated) : 50.1 Heparin Dosing Weight:  70 kg  Vital Signs: Temp: 98.2 F (36.8 C) (03/21 0419) Temp Source: Oral (03/21 0419) BP: 121/53 mmHg (03/21 0419) Pulse Rate: 81 (03/21 0419)  Labs:  Recent Labs  08/19/14 0223  08/19/14 0744  08/19/14 2140 08/20/14 0230 08/20/14 0811  HGB 13.0  --   --   --   --  10.9*  --   HCT 41.6  --   --   --   --  34.8*  --   PLT 357  --   --   --   --  PLATELET CLUMPS NOTED ON SMEAR, COUNT APPEARS ADEQUATE  --   APTT  --   --  26  --   --   --   --   LABPROT  --   --  13.2  --   --   --   --   INR  --   --  0.99  --   --   --   --   CREATININE 0.78  --   --   --   --   --  0.74  TROPONINI  --   < > 1.00*  < > 0.67* 0.63* 0.57*  < > = values in this interval not displayed.  Estimated Creatinine Clearance: 62 mL/min (by C-G formula based on Cr of 0.74).   Medical History: Past Medical History  Diagnosis Date  . Hypertension   . Diabetes mellitus   . High cholesterol   . Other specified disease of sebaceous glands   .  Other B-complex deficiencies   . Depressive disorder, not elsewhere classified   . Tension headache   . Migraine without aura, with intractable migraine, so stated, with status migrainosus   . Type I (juvenile type) diabetes mellitus without mention of complication, uncontrolled   . Unspecified vitamin D deficiency   . Dyskinesia of esophagus   . Memory loss   . Mild cognitive impairment, so stated   . Other nonspecific abnormal serum enzyme levels   . Lipoma of other skin and subcutaneous tissue   . Pain in joint, shoulder region   . Nonspecific (abnormal) findings on radiological and other examination of abdominal area, including retroperitoneum   . Nonspecific abnormal results of liver function study   . Pain in joint, pelvic region and thigh   . Pain in joint, ankle and foot   . Edema   .  Lumbago   . Other symptoms involving cardiovascular system   . Abdominal pain, unspecified site   . Other specified cardiac dysrhythmias(427.89)   . Ventral hernia, unspecified, without mention of obstruction or gangrene   . Extrinsic asthma, unspecified   . Dizziness and giddiness   . Palpitations   . Shortness of breath   . Reflux esophagitis   . Obesity, unspecified   . Unspecified hypothyroidism   . Type I (juvenile type) diabetes mellitus without mention of complication, not stated as uncontrolled   . Other and unspecified hyperlipidemia   . Gout, unspecified   . Unspecified essential hypertension   . Encounter for long-term (current) use of other medications   . Abnormality of gait   . Chest pain, unspecified   . Obstructive chronic bronchitis with exacerbation   . Female stress incontinence   . Apnea   . Cardiomegaly   . Complication of anesthesia     hard time waking up    Medications:  Scheduled:  . aspirin EC  81 mg Oral Daily  . atorvastatin  40 mg Oral q1800  . cholecalciferol  5,000 Units Oral Daily  . citalopram  40 mg Oral Daily  . heparin  5,000 Units  Subcutaneous 3 times per day  . insulin aspart  0-15 Units Subcutaneous 6 times per day  . insulin glargine  5 Units Subcutaneous Daily  . loratadine  10 mg Oral Daily  . magnesium oxide  400 mg Oral Daily  . pantoprazole (PROTONIX) IV  40 mg Intravenous Q24H  . sodium chloride  3 mL Intravenous Q12H   Infusions:  . sodium chloride 100 mL/hr at 08/19/14 1720   PRN: acetaminophen **OR** acetaminophen, hydrALAZINE, hydrOXYzine, metoprolol, nitroGLYCERIN  Assessment: 60 yoF admitted 3/20 with N/V and abdominal pain; found to have high grade SBO on CT this admission.  PMH of Roux-en-Y gastric bypass (2005) that required re-exploration, subsequent ventral hernia repair x2 and SBO that resolved w/ non-operative measures.  PMH also includes HTN, DM, and CAD s/p PCI w/ stent and patient developed c/o chest pain.  Cardiology recommends cardiac cath this admission after she is able to tolerate oral liquids.  Pharmacy is consulted to dose Heparin IV.  Today, 08/20/2014:  Baseline APTT 26 (3/20)  CBC: Hgb 10.9, Plt 357 (3/20)  SCr 0.74, CrCl ~ 62 ml/min  Troponin: 1.08, 1, 0.99, 0.75, 0.67, 0.63, 0.57   Goal of Therapy:  Heparin level 0.3-0.7 units/ml Monitor platelets by anticoagulation protocol: Yes   Plan:   D/C Heparin SQ  Start heparin IV infusion at 850 units/hr  Heparin level 8 hours after starting  Daily heparin level and CBC  Follow up plans and timing for cardiac cath (possibly 3/22)  Gretta Arab PharmD, BCPS Pager [phone] 08/20/2014 1:53 PM

## 2014-08-20 NOTE — Progress Notes (Signed)
At 1745 pt c/o chest pain 10/10 radiating from chest to left arm and jaw with SOB. Pt appeared in distress with pale coloring. MD Coralyn Pear and MD Wynonia Lawman paged immediately. Stat EKG performed showing no change from EKG performed this AM. 3L oxygen delivered via nasal canula. Additionally, nitro sublingual given X3 as per MD order with little relief. Metoprolol 2.5mg  IV given as per MD order. During episode pt BP and mentation remained stable with HR increased to 116 (thus the metoprolol) and oxygen dropping to 85% (thus the oxygen). Coralyn Pear to bedside while [place] consulted via phone. Decision made to transfer pt to ICU and start nitro drip. Also,  0.5mg  morphine given IV X1 as per MD order to relieve chest pain. After administration of all meds and 10 minutes of elapsed time, pt reported pain was subsiding and pain was now 3/10. Pt continues to be on heparin drip with NS at 50 via PIV. Pt stable at this time. Will continue to monitor and transfer when ICU bed is available.

## 2014-08-20 NOTE — Progress Notes (Signed)
Subjective:  Dr. Theodosia Blender note from yesterday was read.  Patient was admitted with a small bowel obstruction and is starting to recover from that.  She over the years has had chest discomfort radiating to her neck but finds it difficult to differentiate it from esophageal pain.  Catheterizations have been done in 2004, 2008, and 2010 that essentially showed calcification very mild LAD disease without significant stenoses elsewhere.  She currently is not having chest pain.  The echocardiogram yesterday showed a picture that looks like Takatsubo with a hyperdynamic proximal segment and apical ballooning.  The EKG also resembles this with T wave inversions anteriorly and troponins have been low level.  She really doesn't have much chest pain now and wants to eat.  She reports having had a bowel movement earlier.  Objective:  Vital Signs in the last 24 hours: BP 121/53 mmHg  Pulse 81  Temp(Src) 98.2 F (36.8 C) (Oral)  Resp 18  Ht 5\' 2"  (1.575 m)  Wt 88.769 kg (195 lb 11.2 oz)  BMI 35.78 kg/m2  SpO2 97%  Physical Exam: Obese pleasant female in no acute distress Lungs:  Clear Cardiac:  Regular rhythm, normal S1 and S2, no S3 Abdomen:  Mild tenderness still Extremities:  No edema present  Intake/Output from previous day: 03/20 0701 - 03/21 0700 In: 2196.7 [I.V.:2196.7] Out: -   Weight Filed Weights   08/19/14 0643 08/20/14 0419  Weight: 89.041 kg (196 lb 4.8 oz) 88.769 kg (195 lb 11.2 oz)    Lab Results: Basic Metabolic Panel:  Recent Labs  08/19/14 0223 08/20/14 0811  NA 131* 138  K 3.6 3.9  CL 95* 106  CO2 26 23  GLUCOSE 392* 136*  BUN 14 16  CREATININE 0.78 0.74   CBC:  Recent Labs  08/19/14 0223 08/20/14 0230  WBC 13.3* 11.6*  NEUTROABS 11.3*  --   HGB 13.0 10.9*  HCT 41.6 34.8*  MCV 84.2 85.3  PLT 357 PLATELET CLUMPS NOTED ON SMEAR, COUNT APPEARS ADEQUATE   Cardiac Enzymes: Troponin (Point of Care Test) No results for input(s): TROPIPOC in the last 72  hours. Cardiac Panel (last 3 results)  Recent Labs  08/19/14 2140 08/20/14 0230 08/20/14 0811  TROPONINI 0.67* 0.63* 0.57*    Telemetry: Sinus rhythm  ECHO:  Apical ballooning, EF 40%   Assessment/Plan:  1.  Clinical event is that of a small bowel obstruction that is resolving 2.  Abnormal EKG and echocardiogram with some atypical chest pain that is most likely consistent with Takasubo syndrome but could also be due to coronary artery disease 3.  Diabetes 4.  Hypertension  Recommendations:  Go ahead and let her eat.  I think that she'll need a catheterization prior to discharge to be sure she has not had intercurrent coronary artery disease to account for her symptoms.  I would like for her small bowel obstruction 2 of resolved prior to getting involved with that.  I will start liquids today and also start heparin.  Possible catheterization tomorrow if she tolerates liquids well today.     Kerry Hough  MD [facility]  08/20/2014, 1:37 PM

## 2014-08-20 NOTE — Progress Notes (Signed)
Shift event summary thusfar: RN paged this Np earlier secondary to pt continuing to have chest pain/throat pain/neck pain of 7-10/10 even after titrating NTG drip 4 times. Pt had Morphine earlier and has ? rash. NP reviewed chart. Pt was transferred to SDU earlier today for unrelieved CP and cardio saw. Note reviewed.  NP spoke with cardio fellow, Dr. Tommi Rumps. Dr Tommi Rumps reviewed chart, meds vitals, etc. Dr. Tommi Rumps agreed that EKG is stable but in light of continued c/o chest pain on NTG drip, will likely need cardiac cath. Pt to be transferred to [place] for cath in am. NPO after MN. RN at [facility] to call cardio fellow upon arrival for pre cath orders and to make aware of pt's arrival. Cardio to continue to follow there.  With ? rash to morphine, will d'c that and add Dilaudid for chest pain. Will continue to titrate NTG drip for CP as BP tolerates.  Clance Boll, NP [facility]

## 2014-08-20 NOTE — Progress Notes (Signed)
Subjective: Some nausea on and off lasting a few seconds, no feeling like she wants to vomit,  passing flatus, and feels like she wants to have a BM.   CP last PM, and decreased EF, low troponin elevation.    Objective: Vital signs in last 24 hours: Temp:  [98 F (36.7 C)-98.2 F (36.8 C)] 98.2 F (36.8 C) (03/21 0419) Pulse Rate:  [81-93] 81 (03/21 0419) Resp:  [16-18] 18 (03/21 0419) BP: (114-143)/(53-94) 121/53 mmHg (03/21 0419) SpO2:  [97 %-98 %] 97 % (03/21 0419) Weight:  [88.769 kg (195 lb 11.2 oz)] 88.769 kg (195 lb 11.2 oz) (03/21 0419)    I/O=? NPO Afebrile, VSS  BP up last PM BMP OK, glucose up some Trop .75/ .67/.63/.57 ECHO yesterday shows drop in EF 35-40%   (60-65% 05/30/14) Film this Am shows contrast in colon, persistent SB dilatation mid abdomen) Intake/Output from previous day: 03/20 0701 - 03/21 0700 In: 2196.7 [I.V.:2196.7] Out: -  Intake/Output this shift:    General appearance: alert, cooperative and no distress GI: soft not really tender, few BS.    Lab Results:   Recent Labs  08/19/14 0223 08/20/14 0230  WBC 13.3* 11.6*  HGB 13.0 10.9*  HCT 41.6 34.8*  PLT 357 PLATELET CLUMPS NOTED ON SMEAR, COUNT APPEARS ADEQUATE    BMET  Recent Labs  08/19/14 0223 08/20/14 0811  NA 131* 138  K 3.6 3.9  CL 95* 106  CO2 26 23  GLUCOSE 392* 136*  BUN 14 16  CREATININE 0.78 0.74  CALCIUM 8.7 8.0*   PT/INR  Recent Labs  08/19/14 0744  LABPROT 13.2  INR 0.99     Recent Labs Lab 08/19/14 0223 08/20/14 0811  AST 21 24  ALT 14 13  ALKPHOS 113 81  BILITOT 0.9 0.7  PROT 7.2 5.7*  ALBUMIN 3.8 3.1*     Lipase     Component Value Date/Time   LIPASE 24 08/19/2014 0223     Studies/Results: Dg Chest 2 View  08/19/2014   CLINICAL DATA:  Chest pain, nausea and vomiting  EXAM: CHEST  2 VIEW  COMPARISON:  06/11/2014  FINDINGS: There is mild vascular prominence without interstitial or alveolar edema. There are no airspace opacities. There  are no effusions. There is chronic compression of a lower thoracic vertebral body, unchanged.  IMPRESSION: Mild vascular congestion without interstitial or alveolar edema.   Electronically Signed   By: Andreas Newport M.D.   On: 08/19/2014 02:51   Ct Abdomen Pelvis W Contrast  08/19/2014   CLINICAL DATA:  Chest pain, nausea, vomiting, diarrhea for 5 hours.  EXAM: CT ABDOMEN AND PELVIS WITH CONTRAST  TECHNIQUE: Multidetector CT imaging of the abdomen and pelvis was performed using the standard protocol following bolus administration of intravenous contrast.  CONTRAST:  80mL OMNIPAQUE IOHEXOL 300 MG/ML SOLN, 141mL OMNIPAQUE IOHEXOL 300 MG/ML SOLN  COMPARISON:  01/24/2009  FINDINGS: There is a high-grade obstruction of the mid jejunum, with marked dilatation of bowel proximal to the transition point. The transition point is in the mid abdomen just to the left of midline, probably in close proximity to the roux-en-Y anastomosis. There is a large ventral hernia in the same area, but the hernia does not appear to be related to the obstruction. The distal jejunum and the entire ileum are decompressed. The colon is decompressed. There is no extraluminal air. There are unremarkable appearances of the gastrojejunostomy.  There are normal appearances of the liver with the exception of a  tiny benign hypodensity which is unchanged from 2010. There are normal appearances of the spleen, pancreas, adrenals and kidneys with the exception of a few benign appearing renal cysts. The abdominal aorta is normal in caliber with mild atherosclerotic calcification. Gallbladder and bile ducts are remarkable only for a few small calculi in the gallbladder lumen. There is no bile duct dilatation.  There is hysterectomy.  No significant abnormality is evident in the lower chest.  IMPRESSION: *High-grade small bowel obstruction in the mid jejunum, most likely due to adhesion. *Large ventral hernia which is not the etiology of the bowel  obstruction. *Unremarkable gastric jejunostomy *Cholelithiasis   Electronically Signed   By: Andreas Newport M.D.   On: 08/19/2014 04:48   Dg Abd 2 Views  08/20/2014   CLINICAL DATA:  Follow up partial small bowel obstruction.  EXAM: ABDOMEN - 2 VIEW  COMPARISON:  08/19/2014  FINDINGS: All of the oral contrast has progressed through to the colon. There is moderate dilatation of mid abdominal small bowel loops without significant interval change. No free air.  IMPRESSION: Persistent dilatation of mid abdominal small bowel loops. All of the oral contrast is now in the colon.   Electronically Signed   By: Andreas Newport M.D.   On: 08/20/2014 06:24   Dg Abd 2 Views  08/19/2014   CLINICAL DATA:  PT C/O CONTINUED ABD DISCOMFORT, WITH SOME RELIEF, NO BM X 1 DAY. PT REPORTS SHE CAME TO ED YESTERDAY FOR N/V, ABD CRAMPING, DIAGNOSED/ADMITTED FOR SBO  EXAM: ABDOMEN - 2 VIEW  COMPARISON:  CT, 07/2014 at 4:02 a.m.  FINDINGS: Dilated loops of central small bowel are noted in the left mid abdomen. Bowel dilation may be minimally improved. It has not worsened. Contrast is seen more distal small bowel, mostly in the pelvis.  No free air.  Hernia mesh overlies much of the right abdomen. Soft tissues otherwise unremarkable.  IMPRESSION: 1. There still prominent loops of small bowel in the left mid abdomen. The degree of dilation may be minimally improved. Contrast is also extended from the more proximal small bowel into the distal small bowel. Findings remain consistent with a partial small bowel obstruction. 2. No free air.   Electronically Signed   By: Lajean Manes M.D.   On: 08/19/2014 09:44    Medications: . aspirin EC  81 mg Oral Daily  . atorvastatin  40 mg Oral q1800  . cholecalciferol  5,000 Units Oral Daily  . citalopram  40 mg Oral Daily  . heparin  5,000 Units Subcutaneous 3 times per day  . insulin aspart  0-15 Units Subcutaneous 6 times per day  . insulin glargine  5 Units Subcutaneous Daily  .  loratadine  10 mg Oral Daily  . magnesium oxide  400 mg Oral Daily  . pantoprazole (PROTONIX) IV  40 mg Intravenous Q24H  . sodium chloride  3 mL Intravenous Q12H    Assessment/Plan SBO Chest pain elevated troponin (demand ischemia) CP back last PM Hx of Roux-en-Y 10 years ago Hx of Ventral hernia with SBO and repair x 2 Hypertension Type II diabetes - she thinks she was in her mid 83's when diabetes became an issue. Hx of tension/Migraine headaches DVT:  Heparin/SCD's added   Plan:  From our standpoint I think we could start clear liquids and see how she does.  I won't order them until she is seen by Cardiology.  Once cleared by Cards wound work on mobilization.    LOS: 1 day  [name] 08/20/2014

## 2014-08-21 ENCOUNTER — Encounter (HOSPITAL_COMMUNITY)
Admission: EM | Disposition: A | Discharge status: Home / Self Care | Payer: Self-pay | Source: Home / Self Care | Attending: Internal Medicine

## 2014-08-21 ENCOUNTER — Ambulatory Visit: Payer: Medicare Other | Admitting: Pulmonary Disease

## 2014-08-21 DIAGNOSIS — I2510 Atherosclerotic heart disease of native coronary artery without angina pectoris: Secondary | ICD-10-CM

## 2014-08-21 HISTORY — PX: LEFT HEART CATHETERIZATION WITH CORONARY ANGIOGRAM: SHX5451

## 2014-08-21 LAB — GLUCOSE, CAPILLARY
Glucose-Capillary: 265 mg/dL — ABNORMAL HIGH (ref 70–99)
Glucose-Capillary: 221 mg/dL — ABNORMAL HIGH (ref 70–99)
Glucose-Capillary: 234 mg/dL — ABNORMAL HIGH (ref 70–99)
Glucose-Capillary: 175 mg/dL — ABNORMAL HIGH (ref 70–99)
Glucose-Capillary: 469 mg/dL — ABNORMAL HIGH (ref 70–99)

## 2014-08-21 LAB — CBC
HCT: 36.2 % (ref 36.0–46.0)
Hemoglobin: 11.1 g/dL — ABNORMAL LOW (ref 12.0–15.0)
MCH: 26.4 pg (ref 26.0–34.0)
MCHC: 30.7 g/dL (ref 30.0–36.0)
MCV: 86.2 fL (ref 78.0–100.0)
Platelets: 282 10*3/uL (ref 150–400)
RBC: 4.2 MIL/uL (ref 3.87–5.11)
RDW: 16 % — ABNORMAL HIGH (ref 11.5–15.5)
WBC: 8.4 10*3/uL (ref 4.0–10.5)

## 2014-08-21 LAB — TROPONIN I: Troponin I: 0.5 ng/mL (ref <0.031)

## 2014-08-21 LAB — CREATININE, SERUM
Creatinine, Ser: 1.14 mg/dL — ABNORMAL HIGH (ref 0.50–1.10)
GFR calc non Af Amer: 46 mL/min — ABNORMAL LOW (ref >90)
GFR, EST AFRICAN AMERICAN: 53 mL/min — AB (ref >90)

## 2014-08-21 LAB — HEPARIN LEVEL (UNFRACTIONATED)
HEPARIN UNFRACTIONATED: 0.23 [IU]/mL — AB (ref 0.30–0.70)
HEPARIN UNFRACTIONATED: 0.24 [IU]/mL — AB (ref 0.30–0.70)

## 2014-08-21 SURGERY — LEFT HEART CATHETERIZATION WITH CORONARY ANGIOGRAM

## 2014-08-21 MED ORDER — BLISTEX MEDICATED EX OINT
TOPICAL_OINTMENT | CUTANEOUS | Status: DC | PRN
  Filled 2014-08-21: qty 10

## 2014-08-21 MED ORDER — ASPIRIN 81 MG PO CHEW: 81.0000 mg | CHEWABLE_TABLET | ORAL | Status: DC

## 2014-08-21 MED ORDER — FENTANYL CITRATE 0.05 MG/ML IJ SOLN
INTRAMUSCULAR | Status: AC
  Filled 2014-08-21: qty 2

## 2014-08-21 MED ORDER — ASPIRIN EC 81 MG PO TBEC
81.0000 mg | DELAYED_RELEASE_TABLET | Freq: Every day | ORAL | Status: DC
  Administered 2014-08-23 – 2014-08-24 (×2): 81 mg via ORAL
  Filled 2014-08-21 (×2): qty 1

## 2014-08-21 MED ORDER — SODIUM CHLORIDE 0.9 % IJ SOLN: 3.0000 mL | INTRAMUSCULAR | Status: DC | PRN

## 2014-08-21 MED ORDER — SODIUM CHLORIDE 0.9 % IV SOLN: 250.0000 mL | INTRAVENOUS | Status: DC | PRN

## 2014-08-21 MED ORDER — SODIUM CHLORIDE 0.9 % IJ SOLN: 3.0000 mL | Freq: Two times a day (BID) | INTRAMUSCULAR | Status: DC

## 2014-08-21 MED ORDER — SODIUM CHLORIDE 0.9 % IJ SOLN
3.0000 mL | Freq: Two times a day (BID) | INTRAMUSCULAR | Status: DC
  Administered 2014-08-21: 3 mL via INTRAVENOUS

## 2014-08-21 MED ORDER — MIDAZOLAM HCL 2 MG/2ML IJ SOLN
INTRAMUSCULAR | Status: AC
  Filled 2014-08-21: qty 2

## 2014-08-21 MED ORDER — VERAPAMIL HCL 2.5 MG/ML IV SOLN
INTRAVENOUS | Status: AC
  Filled 2014-08-21: qty 2

## 2014-08-21 MED ORDER — HYDROXYZINE HCL 50 MG/ML IM SOLN
25.0000 mg | INTRAMUSCULAR | Status: DC | PRN
  Administered 2014-08-21: 25 mg via INTRAMUSCULAR
  Filled 2014-08-21 (×2): qty 0.5

## 2014-08-21 MED ORDER — SODIUM CHLORIDE 0.9 % IV SOLN
1.0000 mL/kg/h | INTRAVENOUS | Status: DC
Start: 2014-08-21 — End: 2014-08-22

## 2014-08-21 MED ORDER — HEPARIN SODIUM (PORCINE) 1000 UNIT/ML IJ SOLN
INTRAMUSCULAR | Status: AC
  Filled 2014-08-21: qty 1

## 2014-08-21 MED ORDER — INSULIN ASPART 100 UNIT/ML ~~LOC~~ SOLN
0.0000 [IU] | Freq: Three times a day (TID) | SUBCUTANEOUS | Status: DC
  Administered 2014-08-22 (×2): 11 [IU] via SUBCUTANEOUS

## 2014-08-21 MED ORDER — HEPARIN SODIUM (PORCINE) 5000 UNIT/ML IJ SOLN
5000.0000 [IU] | Freq: Three times a day (TID) | INTRAMUSCULAR | Status: DC
  Administered 2014-08-22 – 2014-08-24 (×7): 5000 [IU] via SUBCUTANEOUS
  Filled 2014-08-21 (×6): qty 1

## 2014-08-21 MED ORDER — LIDOCAINE HCL (PF) 1 % IJ SOLN
INTRAMUSCULAR | Status: AC
  Filled 2014-08-21: qty 30

## 2014-08-21 MED ORDER — HEPARIN (PORCINE) IN NACL 2-0.9 UNIT/ML-% IJ SOLN
INTRAMUSCULAR | Status: AC
  Filled 2014-08-21: qty 1000

## 2014-08-21 MED ORDER — INSULIN ASPART 100 UNIT/ML ~~LOC~~ SOLN
10.0000 [IU] | Freq: Once | SUBCUTANEOUS | Status: AC
  Administered 2014-08-21: 10 [IU] via SUBCUTANEOUS

## 2014-08-21 MED ORDER — ONDANSETRON HCL 4 MG/2ML IJ SOLN
4.0000 mg | Freq: Four times a day (QID) | INTRAMUSCULAR | Status: DC | PRN
  Administered 2014-08-22: 13:00:00 4 mg via INTRAVENOUS
  Filled 2014-08-21: qty 2

## 2014-08-21 MED ORDER — FAMOTIDINE 20 MG PO TABS
20.0000 mg | ORAL_TABLET | ORAL | Status: AC
  Administered 2014-08-21: 20 mg via ORAL
  Filled 2014-08-21: qty 1

## 2014-08-21 MED ORDER — HEPARIN (PORCINE) IN NACL 100-0.45 UNIT/ML-% IJ SOLN
1100.0000 [IU]/h | INTRAMUSCULAR | Status: DC
  Administered 2014-08-21: 950 [IU]/h via INTRAVENOUS
  Filled 2014-08-21 (×2): qty 250

## 2014-08-21 MED ORDER — SODIUM CHLORIDE 0.9 % IV SOLN
INTRAVENOUS | Status: DC
  Administered 2014-08-21: 09:00:00 via INTRAVENOUS

## 2014-08-21 MED ORDER — NITROGLYCERIN 1 MG/10 ML FOR IR/CATH LAB
INTRA_ARTERIAL | Status: AC
  Filled 2014-08-21: qty 10

## 2014-08-21 MED ORDER — METHYLPREDNISOLONE SODIUM SUCC 125 MG IJ SOLR
125.0000 mg | INTRAMUSCULAR | Status: AC
  Administered 2014-08-21: 125 mg via INTRAVENOUS
  Filled 2014-08-21: qty 2

## 2014-08-21 MED ORDER — FAMOTIDINE IN NACL 20-0.9 MG/50ML-% IV SOLN
20.0000 mg | INTRAVENOUS | Status: DC
  Filled 2014-08-21: qty 50

## 2014-08-21 NOTE — CV Procedure (Signed)
    Cardiac Catheterization Procedure Note  Name: [name] MRN: [id number] DOB: 1940-11-03  Procedure: Left Heart Cath, Selective Coronary Angiography, LV angiography  Indication: NSTEMI   Procedural Details: The right wrist was prepped, draped, and anesthetized with 1% lidocaine. Using the modified Seldinger technique, a 5/6 French Slender sheath was introduced into the right radial artery. 3 mg of verapamil was administered through the sheath, weight-based unfractionated heparin was administered intravenously. Standard Judkins catheters were used for selective coronary angiography and left ventriculography. Catheter exchanges were performed over an exchange length guidewire. There were no immediate procedural complications. A TR band was used for radial hemostasis at the completion of the procedure.  The patient was transferred to the post catheterization recovery area for further monitoring.  Procedural Findings: Hemodynamics: AO 107/54 LV 106/7  Coronary angiography: Coronary dominance: right  Left mainstem: The left mainstem is patent. There is minimal distal left main narrowing 20%. There is mild calcification present.  Left anterior descending (LAD): The LAD is patent to the apex. There is mild diffuse proximal vessel calcification. There are mild irregularities with about 20% stenosis. The diagonal branches are patent without significant stenosis.  Left circumflex (LCx): The left circumflex is codominant with the right coronary artery. The ramus intermedius branch has mild 20-30% stenosis in its midportion. The AV circumflex is widely patent. The left PLA branch is widely patent.  Right coronary artery (RCA): The RCA is dominant. The PDA branch is large. Proximal RCA is widely patent. The mid RCA has diffuse 30% stenosis. The distal RCA is patent.  Left ventriculography: The LVEF is estimated at 40-45%. There is hypokinesis of the distal anterior wall and apex. There is severe  hypokinesis of the inferoapex.  Estimated Blood Loss: minimal  Final Conclusions:   1. Mild nonobstructive CAD 2. Mild segmental LV systolic dysfunction with periapical wall motion abnormality and hyperdynamic motion at the base of the heart 3. Normal LVEDP  Recommendations: Supportive medical therapy. Suspect Takotsubo's Cardiomyopathy.  Sherren Mocha MD, [facility] 08/21/2014, 4:09 PM

## 2014-08-21 NOTE — Evaluation (Signed)
Occupational Therapy Evaluation Patient Details Name: [name] MRN: [id number] DOB: 02-Feb-1941 Today's Date: 08/21/2014    History of Present Illness Admitted to [facility] with abdominal pain, nausea and vomiting due to SBO on 08/19/14.  Transferred to [facility] with CP for impending cardiac catherization. PMH: GERD, fibromyalgia, HTN, asthma, DM, depression, CHF, gastric bypass.   Clinical Impression   Pt was independent in self care and mobility prior to admission.  Limited session today due to impending cardiac cath later today.  Pt ambulating to bathroom and toileting with supervision.  Needs further assessment of LB ADL next visit. Will follow.  Do not anticipate pt will need post acute rehab.    Follow Up Recommendations  No OT follow up    Equipment Recommendations  None recommended by OT    Recommendations for Other Services       Precautions / Restrictions Precautions Precautions: Fall Restrictions Weight Bearing Restrictions: No      Mobility Bed Mobility Overal bed mobility: Modified Independent                Transfers Overall transfer level: Needs assistance Equipment used: None Transfers: Sit to/from Stand Sit to Stand: Supervision              Balance                                            ADL Overall ADL's : Needs assistance/impaired Eating/Feeding: Independent;Sitting   Grooming: Wash/dry hands;Sitting;Set up   Upper Body Bathing: Set up;Sitting       Upper Body Dressing : Set up;Sitting       Toilet Transfer: Supervision/safety;Ambulation;Comfort height toilet   Toileting- Clothing Manipulation and Hygiene: Supervision/safety;Sitting/lateral lean       Functional mobility during ADLs: Supervision/safety       Vision     Perception     Praxis      Pertinent Vitals/Pain Pain Assessment: No/denies pain     Hand Dominance Right   Extremity/Trunk Assessment Upper Extremity Assessment Upper Extremity  Assessment: Generalized weakness   Lower Extremity Assessment Lower Extremity Assessment: Defer to PT evaluation   Cervical / Trunk Assessment Cervical / Trunk Assessment: Normal   Communication Communication Communication: No difficulties   Cognition Arousal/Alertness: Awake/alert Behavior During Therapy: WFL for tasks assessed/performed Overall Cognitive Status: Within Functional Limits for tasks assessed                     General Comments       Exercises       Shoulder Instructions      Home Living Family/patient expects to be discharged to:: Private residence Living Arrangements: Spouse/significant other Available Help at Discharge: Family Type of Home: House Home Access: Stairs to enter Technical brewer of Steps: 7 Entrance Stairs-Rails: Right Home Layout: Two level;Able to live on main level with bedroom/bathroom     Bathroom Shower/Tub: Occupational psychologist: Handicapped height     Home Equipment: Youth worker - 2 wheels;Cane - single point;Shower seat   Additional Comments: walk in shower, elevated toilet      Prior Functioning/Environment Level of Independence: Independent             OT Diagnosis: Generalized weakness   OT Problem List: Decreased strength;Decreased activity tolerance;Impaired balance (sitting and/or standing);Cardiopulmonary status limiting activity;Obesity   OT Treatment/Interventions: Self-care/ADL  training;Energy conservation;Therapeutic activities;Patient/family education    OT Goals(Current goals can be found in the care plan section) Acute Rehab OT Goals Patient Stated Goal: find out whether she is to have a cardiac cath OT Goal Formulation: With patient Time For Goal Achievement: 08/28/14 Potential to Achieve Goals: Good ADL Goals Pt Will Perform Grooming: Independently;standing Pt Will Perform Lower Body Bathing: with modified independence;sit to/from stand Pt Will Perform Lower  Body Dressing: with modified independence;sit to/from stand Pt Will Transfer to Toilet: Independently;ambulating Pt Will Perform Toileting - Clothing Manipulation and hygiene: Independently;sit to/from stand Pt Will Perform Tub/Shower Transfer: Shower transfer;Independently;ambulating;shower seat  OT Frequency: Min 2X/week   Barriers to D/C:            Co-evaluation              End of Session Nurse Communication: Other (comment) (ok for bed to bathroom)  Activity Tolerance: Treatment limited secondary to medical complications (Comment) (pt on nitroglycerin drip, impending cardiac cath) Patient left: in bed;with call bell/phone within reach (MD present)   Time: 5883-2549 OT Time Calculation (min): 23 min Charges:  OT General Charges $OT Visit: 1 Procedure OT Evaluation $Initial OT Evaluation Tier I: 1 Procedure G-Codes:    Malka So 08/21/2014, 8:46 AM  [phone]

## 2014-08-21 NOTE — H&P (View-Only) (Signed)
Subjective:  She had fairly severe chest pain and shortness of breath last evening and was moved my instructions to the intensive care unit and started on nitroglycerin.  After 3 sublingual nitroglycerin the pain persisted but then resolved.  Troponins have remained flat.  The discomfort started after she swallowed a large pill yesterday and she thinks that her esophagus is irritated from vomiting.  No chest discomfort at the present time.  Echo shows apical ballooning and she has diffuse T-wave inversions anteriorly.  Objective:  Vital Signs in the last 24 hours: BP 98/44 mmHg  Pulse 78  Temp(Src) 98.3 F (36.8 C) (Oral)  Resp 17  Ht 5\' 3"  (1.6 m)  Wt 91.9 kg (202 lb 9.6 oz)  BMI 35.90 kg/m2  SpO2 98%  Physical Exam: Obese pleasant female in no acute distress Lungs:  Clear Cardiac:  Regular rhythm, normal S1 and S2, no S3 Abdomen:  Mild tenderness still Extremities:  No edema present  Intake/Output from previous day: 03/21 0701 - 03/22 0700 In: 2289 [P.O.:360; I.V.:1929] Out: -   Weight Filed Weights   08/20/14 0419 08/20/14 2030 08/21/14 0400  Weight: 88.769 kg (195 lb 11.2 oz) 91.1 kg (200 lb 13.4 oz) 91.9 kg (202 lb 9.6 oz)    Lab Results: Basic Metabolic Panel:  Recent Labs  08/19/14 0223 08/20/14 0811  NA 131* 138  K 3.6 3.9  CL 95* 106  CO2 26 23  GLUCOSE 392* 136*  BUN 14 16  CREATININE 0.78 0.74   CBC:  Recent Labs  08/19/14 0223 08/20/14 0230  WBC 13.3* 11.6*  NEUTROABS 11.3*  --   HGB 13.0 10.9*  HCT 41.6 34.8*  MCV 84.2 85.3  PLT 357 PLATELET CLUMPS NOTED ON SMEAR, COUNT APPEARS ADEQUATE   Cardiac Enzymes: Troponin (Point of Care Test) No results for input(s): TROPIPOC in the last 72 hours. Cardiac Panel (last 3 results)  Recent Labs  08/20/14 1443 08/20/14 2020 08/21/14 0200  TROPONINI 0.92* 0.75* 0.50*    Telemetry: Sinus rhythm  ECHO:  Apical ballooning, EF 40%   Assessment/Plan:  1.  Clinical event is that of a small  bowel obstruction that is resolving 2.  Prolonged chest discomfort ischemic sounding but also with prior history of esophageal problems.  Concern is whether this is Takasubo or whether it is progression of coronary disease.   3.  Diabetes 4.  Hypertension  Recommendations:  Have recommended cardiac catheterization.  Because of her prior bowel problems and obesity would like to try a radial approach and we'll ask colleagues from [facility] to do this later on today.  Cardiac catheterization procedure was discussed with the patient fully including risks of myocardial infarction, death, stroke, bleeding, arrhythmia, dye allergy, or renal insufficiency. The patient understands and is willing to proceed.  Possibility of intervention the same time also discussed with patient.      Kerry Hough  MD [facility]  08/21/2014, 8:33 AM

## 2014-08-21 NOTE — Evaluation (Signed)
Physical Therapy Evaluation Patient Details Name: [name] MRN: [id number] DOB: 05-08-1941 Today's Date: 08/21/2014   History of Present Illness  Admitted to [facility] with abdominal pain, nausea and vomiting due to SBO on 08/19/14.  Transferred to [facility] with CP for impending cardiac catherization. PMH: GERD, fibromyalgia, HTN, asthma, DM, depression, CHF, gastric bypass.  Clinical Impression  Patient demonstrates deficits in funcitonal mobility as indicated below. Will need continued skilled PT to address deficits and maximize function, will see as indicated and progress as tolerated. OF NOTE: Patient HR elevated to 120s with decreased O2 saturations 90% with very MINIMAL ambulation from bathroom to bed, patient had to stop upon standing to 'catch her breath'.     Follow Up Recommendations Home health PT;Supervision/Assistance - 24 hour    Equipment Recommendations  None recommended by PT    Recommendations for Other Services       Precautions / Restrictions Precautions Precautions: Fall Restrictions Weight Bearing Restrictions: No      Mobility  Bed Mobility Overal bed mobility: Modified Independent                Transfers Overall transfer level: Needs assistance Equipment used: None Transfers: Sit to/from Stand Sit to Stand: Supervision            Ambulation/Gait Ambulation/Gait assistance: Min guard Ambulation Distance (Feet): 22 Feet Assistive device: None Gait Pattern/deviations: Step-through pattern;Decreased stride length Gait velocity: decreased   General Gait Details: patient very quickly SOB with increased DOE, had to take standing rest break after several steps, HR elevated to 120s with saturations dropping to 90%. Improved with rest and overall limited activity  Stairs            Wheelchair Mobility    Modified Rankin (Stroke Patients Only)       Balance Overall balance assessment: Needs assistance Sitting-balance support: Feet  supported Sitting balance-Leahy Scale: Good     Standing balance support: During functional activity Standing balance-Leahy Scale: Fair Standing balance comment: No physical assist required, noted balance checks in static standing                             Pertinent Vitals/Pain Pain Assessment: No/denies pain    Home Living Family/patient expects to be discharged to:: Private residence Living Arrangements: Spouse/significant other Available Help at Discharge: Family Type of Home: House Home Access: Stairs to enter Entrance Stairs-Rails: Right Entrance Stairs-Number of Steps: 7 Home Layout: Two level;Able to live on main level with bedroom/bathroom Home Equipment: Youth worker - 2 wheels;Cane - single point;Shower seat Additional Comments: walk in shower, elevated toilet    Prior Function Level of Independence: Independent               Hand Dominance   Dominant Hand: Right    Extremity/Trunk Assessment   Upper Extremity Assessment: Generalized weakness           Lower Extremity Assessment: Defer to PT evaluation      Cervical / Trunk Assessment: Normal  Communication   Communication: No difficulties  Cognition Arousal/Alertness: Awake/alert Behavior During Therapy: WFL for tasks assessed/performed Overall Cognitive Status: Within Functional Limits for tasks assessed                      General Comments      Exercises        Assessment/Plan    PT Assessment Patient needs continued PT services  PT  Diagnosis Difficulty walking   PT Problem List Decreased activity tolerance;Decreased balance;Decreased mobility;Decreased safety awareness;Cardiopulmonary status limiting activity  PT Treatment Interventions DME instruction;Gait training;Stair training;Functional mobility training;Therapeutic activities;Therapeutic exercise;Balance training;Patient/family education   PT Goals (Current goals can be found in the Care  Plan section) Acute Rehab PT Goals Patient Stated Goal: find out whether she is to have a cardiac cath PT Goal Formulation: With patient Time For Goal Achievement: 09/04/14 Potential to Achieve Goals: Good    Frequency Min 3X/week   Barriers to discharge        Co-evaluation               End of Session Equipment Utilized During Treatment: Oxygen Activity Tolerance: Patient limited by fatigue;Treatment limited secondary to medical complications (Comment) (elevated HR, decreased O2 saturations with minimal activity) Patient left: in bed;with call bell/phone within reach Nurse Communication: Mobility status         Time: 0812-0827 PT Time Calculation (min) (ACUTE ONLY): 15 min   Charges:   PT Evaluation $Initial PT Evaluation Tier I: 1 Procedure     PT G CodesDuncan Dull 23-Aug-2014, [place], PT DPT  [phone]

## 2014-08-21 NOTE — Progress Notes (Signed)
ANTICOAGULATION CONSULT NOTE - Follow Up Consult  Pharmacy Consult for Heparin Indication: chest pain/ACS  Allergies  Allergen Reactions  . Ciprofloxacin Rash  . Advair Diskus [Fluticasone-Salmeterol] Other (See Comments)    Throat closes  . Albuterol     REACTION: closes throat  . Alendronate Sodium Itching  . Benadryl [Diphenhydramine Hcl]   . Benzonatate     REACTION: rash/hives  . Cephalexin Nausea Only  . Gabapentin Other (See Comments)    Loss of memory  . Iohexol      Desc: rash and DIF BREATHING   . Keflex [Cephalexin]   . Morphine Nausea And Vomiting  . Other     Decongestants  . Propoxyphene Hcl   . Reclast [Zoledronic Acid] Other (See Comments)    Chest pain  . Avapro [Irbesartan] Rash  . Codeine Nausea And Vomiting, Swelling and Rash  . Tessalon Perles Rash  . Tramadol Nausea Only and Rash    Patient Measurements: Height: 5\' 3"  (160 cm) Weight: 200 lb 13.4 oz (91.1 kg) IBW/kg (Calculated) : 52.4 Heparin Dosing Weight:   Vital Signs: Temp: 98.7 F (37.1 C) (03/22 0000) Temp Source: Oral (03/22 0000) BP: 90/46 mmHg (03/21 2330) Pulse Rate: 90 (03/21 2330)  Labs:  Recent Labs  08/19/14 0223  08/19/14 0744  08/20/14 0230 08/20/14 0811 08/20/14 1443 08/20/14 2020 08/20/14 2220  HGB 13.0  --   --   --  10.9*  --   --   --   --   HCT 41.6  --   --   --  34.8*  --   --   --   --   PLT 357  --   --   --  PLATELET CLUMPS NOTED ON SMEAR, COUNT APPEARS ADEQUATE  --   --   --   --   APTT  --   --  26  --   --   --   --   --   --   LABPROT  --   --  13.2  --   --   --   --   --   --   INR  --   --  0.99  --   --   --   --   --   --   HEPARINUNFRC  --   --   --   --   --   --   --   --  0.23*  CREATININE 0.78  --   --   --   --  0.74  --   --   --   TROPONINI  --   < > 1.00*  < > 0.63* 0.57* 0.92* 0.75*  --   < > = values in this interval not displayed.  Estimated Creatinine Clearance: 64.1 mL/min (by C-G formula based on Cr of  0.74).   Medications:  Infusions:  . sodium chloride 50 mL/hr at 08/20/14 2300  . heparin    . nitroGLYCERIN 30 mcg/min (08/20/14 2300)    Assessment: Patient with heparin level just below goal.  No heparin issues per RN.  Goal of Therapy:  Heparin level 0.3-0.7 units/ml Monitor platelets by anticoagulation protocol: Yes   Plan:  Increase heparin to 950 units/hr Recheck level at 0900  Tyler Deis, Shea Stakes [place] 08/21/2014,12:51 AM

## 2014-08-21 NOTE — Interval H&P Note (Signed)
History and Physical Interval Note:  08/21/2014 3:25 PM  [name]  has presented today for surgery, with the diagnosis of cp  The various methods of treatment have been discussed with the patient and family. After consideration of risks, benefits and other options for treatment, the patient has consented to  Procedure(s): LEFT HEART CATHETERIZATION WITH CORONARY ANGIOGRAM (N/A) as a surgical intervention .  The patient's history has been reviewed, patient examined, no change in status, stable for surgery.  I have reviewed the patient's chart and labs.  Questions were answered to the patient's satisfaction.    Cath Lab Visit (complete for each Cath Lab visit)  Clinical Evaluation Leading to the Procedure:   ACS: Yes.    Non-ACS:    Anginal Classification: CCS IV  Anti-ischemic medical therapy: No Therapy  Non-Invasive Test Results: No non-invasive testing performed  Prior CABG: No previous CABG       Sherren Mocha

## 2014-08-21 NOTE — Progress Notes (Signed)
Inpatient Diabetes Program Recommendations  AACE/ADA: New Consensus Statement on Inpatient Glycemic Control (2013)  Target Ranges:  Prepandial:   less than 140 mg/dL      Peak postprandial:   less than 180 mg/dL (1-2 hours)      Critically ill patients:  140 - 180 mg/dL   Results for [name], [name] (MRN [id number]) as of 08/21/2014 10:06  Ref. Range 08/20/2014 04:15 08/20/2014 07:42 08/20/2014 12:06 08/20/2014 16:51 08/20/2014 23:27 08/21/2014 03:47 08/21/2014 09:35  Glucose-Capillary Latest Range: 70-99 mg/dL 172 (H) 141 (H) 151 (H) 250 (H) 228 (H) 265 (H) 221 (H)   Diabetes history: DM2 Outpatient Diabetes medications: NPH 25 units QAM, NHP 20 units QPM, Glucotrol 20 mg BID Current orders for Inpatient glycemic control: Lantus 5 units daily, Novolog 0-15 units Q4H  Inpatient Diabetes Program Recommendations Insulin - Basal: Note patient is NPO for heart cath today. Fasting glucose is 221 mg/dl this morning. Please consider increasing Lantus to 10 units daily.  Thanks, Barnie Alderman, RN, MSN, CCRN, CDE Diabetes Coordinator Inpatient Diabetes Program [phone] (Team Pager from [place] to [place]) [phone] ([facility] office) [phone] [facility] office)

## 2014-08-21 NOTE — Progress Notes (Signed)
TR BAND REMOVAL  LOCATION:    right radial  DEFLATED PER PROTOCOL:    Yes.    TIME BAND OFF / DRESSING APPLIED:    1900   SITE UPON ARRIVAL:    Level 0  SITE AFTER BAND REMOVAL:    Level 0  REVERSE ALLEN'S TEST:     positive  CIRCULATION SENSATION AND MOVEMENT:    Within Normal Limits   Yes.    COMMENTS:   Tolerated procedure well 

## 2014-08-21 NOTE — Progress Notes (Signed)
Subjective:  She had fairly severe chest pain and shortness of breath last evening and was moved my instructions to the intensive care unit and started on nitroglycerin.  After 3 sublingual nitroglycerin the pain persisted but then resolved.  Troponins have remained flat.  The discomfort started after she swallowed a large pill yesterday and she thinks that her esophagus is irritated from vomiting.  No chest discomfort at the present time.  Echo shows apical ballooning and she has diffuse T-wave inversions anteriorly.  Objective:  Vital Signs in the last 24 hours: BP 98/44 mmHg  Pulse 78  Temp(Src) 98.3 F (36.8 C) (Oral)  Resp 17  Ht 5\' 3"  (1.6 m)  Wt 91.9 kg (202 lb 9.6 oz)  BMI 35.90 kg/m2  SpO2 98%  Physical Exam: Obese pleasant female in no acute distress Lungs:  Clear Cardiac:  Regular rhythm, normal S1 and S2, no S3 Abdomen:  Mild tenderness still Extremities:  No edema present  Intake/Output from previous day: 03/21 0701 - 03/22 0700 In: 2289 [P.O.:360; I.V.:1929] Out: -   Weight Filed Weights   08/20/14 0419 08/20/14 2030 08/21/14 0400  Weight: 88.769 kg (195 lb 11.2 oz) 91.1 kg (200 lb 13.4 oz) 91.9 kg (202 lb 9.6 oz)    Lab Results: Basic Metabolic Panel:  Recent Labs  08/19/14 0223 08/20/14 0811  NA 131* 138  K 3.6 3.9  CL 95* 106  CO2 26 23  GLUCOSE 392* 136*  BUN 14 16  CREATININE 0.78 0.74   CBC:  Recent Labs  08/19/14 0223 08/20/14 0230  WBC 13.3* 11.6*  NEUTROABS 11.3*  --   HGB 13.0 10.9*  HCT 41.6 34.8*  MCV 84.2 85.3  PLT 357 PLATELET CLUMPS NOTED ON SMEAR, COUNT APPEARS ADEQUATE   Cardiac Enzymes: Troponin (Point of Care Test) No results for input(s): TROPIPOC in the last 72 hours. Cardiac Panel (last 3 results)  Recent Labs  08/20/14 1443 08/20/14 2020 08/21/14 0200  TROPONINI 0.92* 0.75* 0.50*    Telemetry: Sinus rhythm  ECHO:  Apical ballooning, EF 40%   Assessment/Plan:  1.  Clinical event is that of a small  bowel obstruction that is resolving 2.  Prolonged chest discomfort ischemic sounding but also with prior history of esophageal problems.  Concern is whether this is Takasubo or whether it is progression of coronary disease.   3.  Diabetes 4.  Hypertension  Recommendations:  Have recommended cardiac catheterization.  Because of her prior bowel problems and obesity would like to try a radial approach and we'll ask colleagues from [facility] to do this later on today.  Cardiac catheterization procedure was discussed with the patient fully including risks of myocardial infarction, death, stroke, bleeding, arrhythmia, dye allergy, or renal insufficiency. The patient understands and is willing to proceed.  Possibility of intervention the same time also discussed with patient.      Kerry Hough  MD [facility] Cardiology  08/21/2014, 8:33 AM

## 2014-08-21 NOTE — Progress Notes (Signed)
Patient ID: [name], female   DOB: 10-21-1940, 74 y.o.   MRN: [id number]    Subjective: Pt feels well today.  Passed some flatus and had a BM. Tolerated clear liquids yesterday.  Objective: Vital signs in last 24 hours: Temp:  [98.3 F (36.8 C)-100 F (37.8 C)] 98.3 F (36.8 C) (03/22 0400) Pulse Rate:  [73-106] 78 (03/22 0345) Resp:  [16-25] 17 (03/22 0345) BP: (81-144)/(36-71) 98/44 mmHg (03/22 0345) SpO2:  [92 %-100 %] 98 % (03/22 0345) Weight:  [91.1 kg (200 lb 13.4 oz)-91.9 kg (202 lb 9.6 oz)] 91.9 kg (202 lb 9.6 oz) (03/22 0400)    Intake/Output from previous day: 08-23-22 0701 - 03/22 0700 In: 2289 [P.O.:360; I.V.:1929] Out: -  Intake/Output this shift:    PE: Abd: soft, NT, ND, +BS Heart: regular Lungs: CTAB  Lab Results:   Recent Labs  08/19/14 0223 08/23/2014 0230  WBC 13.3* 11.6*  HGB 13.0 10.9*  HCT 41.6 34.8*  PLT 357 PLATELET CLUMPS NOTED ON SMEAR, COUNT APPEARS ADEQUATE   BMET  Recent Labs  08/19/14 0223 2014/08/23 0811  NA 131* 138  K 3.6 3.9  CL 95* 106  CO2 26 23  GLUCOSE 392* 136*  BUN 14 16  CREATININE 0.78 0.74  CALCIUM 8.7 8.0*   PT/INR  Recent Labs  08/19/14 0744  LABPROT 13.2  INR 0.99   CMP     Component Value Date/Time   NA 138 Aug 23, 2014 0811   NA 140 06/05/2013 0835   K 3.9 Aug 23, 2014 0811   CL 106 2014/08/23 0811   CO2 23 Aug 23, 2014 0811   GLUCOSE 136* 08/23/14 0811   GLUCOSE 132* 06/05/2013 0835   BUN 16 Aug 23, 2014 0811   BUN 17 06/05/2013 0835   CREATININE 0.74 2014/08/23 0811   CALCIUM 8.0* August 23, 2014 0811   PROT 5.7* August 23, 2014 0811   PROT 6.3 06/05/2013 0835   ALBUMIN 3.1* August 23, 2014 0811   AST 24 2014/08/23 0811   ALT 13 08-23-14 0811   ALKPHOS 81 Aug 23, 2014 0811   BILITOT 0.7 23-Aug-2014 0811   GFRNONAA 81* August 23, 2014 0811   GFRAA >90 08/23/14 0811   Lipase     Component Value Date/Time   LIPASE 24 08/19/2014 0223       Studies/Results: Dg Abd 2 Views  2014-08-23   CLINICAL DATA:   Follow up partial small bowel obstruction.  EXAM: ABDOMEN - 2 VIEW  COMPARISON:  08/19/2014  FINDINGS: All of the oral contrast has progressed through to the colon. There is moderate dilatation of mid abdominal small bowel loops without significant interval change. No free air.  IMPRESSION: Persistent dilatation of mid abdominal small bowel loops. All of the oral contrast is now in the colon.   Electronically Signed   By: Andreas Newport M.D.   On: 08/23/2014 06:24   Dg Abd 2 Views  08/19/2014   CLINICAL DATA:  PT C/O CONTINUED ABD DISCOMFORT, WITH SOME RELIEF, NO BM X 1 DAY. PT REPORTS SHE CAME TO ED YESTERDAY FOR N/V, ABD CRAMPING, DIAGNOSED/ADMITTED FOR SBO  EXAM: ABDOMEN - 2 VIEW  COMPARISON:  CT, 07/2014 at 4:02 a.m.  FINDINGS: Dilated loops of central small bowel are noted in the left mid abdomen. Bowel dilation may be minimally improved. It has not worsened. Contrast is seen more distal small bowel, mostly in the pelvis.  No free air.  Hernia mesh overlies much of the right abdomen. Soft tissues otherwise unremarkable.  IMPRESSION: 1. There still prominent loops of small bowel  in the left mid abdomen. The degree of dilation may be minimally improved. Contrast is also extended from the more proximal small bowel into the distal small bowel. Findings remain consistent with a partial small bowel obstruction. 2. No free air.   Electronically Signed   By: Lajean Manes M.D.   On: 08/19/2014 09:44    Anti-infectives: Anti-infectives    None       Assessment/Plan  1. SBO -advance diet after cath today 2.  CAD -cath possibly today per cards  -further care per primary team  LOS: 2 days    Geraldin Habermehl E 08/21/2014, 8:00 AM Pager: [phone]

## 2014-08-21 NOTE — Progress Notes (Signed)
ANTICOAGULATION CONSULT NOTE - Follow Up Consult  Pharmacy Consult for Heparin Indication: chest pain/ACS  Allergies  Allergen Reactions  . Ciprofloxacin Rash  . Advair Diskus [Fluticasone-Salmeterol] Other (See Comments)    Throat closes  . Albuterol     REACTION: closes throat  . Alendronate Sodium Itching  . Benadryl [Diphenhydramine Hcl]   . Benzonatate     REACTION: rash/hives  . Cephalexin Nausea Only  . Gabapentin Other (See Comments)    Loss of memory  . Iohexol      Desc: rash and DIF BREATHING   . Keflex [Cephalexin]   . Morphine Nausea And Vomiting  . Other     Decongestants  . Propoxyphene Hcl   . Reclast [Zoledronic Acid] Other (See Comments)    Chest pain  . Avapro [Irbesartan] Rash  . Codeine Nausea And Vomiting, Swelling and Rash  . Tessalon Perles Rash  . Tramadol Nausea Only and Rash    Labs:  Recent Labs  08/19/14 0223  08/19/14 0744  08/20/14 0230 08/20/14 0811 08/20/14 1443 08/20/14 2020 08/20/14 2220 08/21/14 0200 08/21/14 1155  HGB 13.0  --   --   --  10.9*  --   --   --   --   --   --   HCT 41.6  --   --   --  34.8*  --   --   --   --   --   --   PLT 357  --   --   --  PLATELET CLUMPS NOTED ON SMEAR, COUNT APPEARS ADEQUATE  --   --   --   --   --   --   APTT  --   --  26  --   --   --   --   --   --   --   --   LABPROT  --   --  13.2  --   --   --   --   --   --   --   --   INR  --   --  0.99  --   --   --   --   --   --   --   --   HEPARINUNFRC  --   --   --   --   --   --   --   --  0.23*  --  0.24*  CREATININE 0.78  --   --   --   --  0.74  --   --   --   --   --   TROPONINI  --   < > 1.00*  < > 0.63* 0.57* 0.92* 0.75*  --  0.50*  --   < > = values in this interval not displayed.  Estimated Creatinine Clearance: 64.4 mL/min (by C-G formula based on Cr of 0.74).   Medications:  Infusions:  . sodium chloride 50 mL/hr at 08/21/14 0400  . sodium chloride 75 mL/hr at 08/21/14 0924  . heparin 950 Units/hr (08/21/14 0400)  .  nitroGLYCERIN 20 mcg/min (08/21/14 0400)    Assessment: 74 year old female continues on heparin for chest pain with plans for cath today Heparin level low at 0.24  Goal of Therapy:  Heparin level 0.3-0.7 units/ml Monitor platelets by anticoagulation protocol: Yes   Plan:  Increase heparin to 1100 units/hr Follow up after cath  Thank you. Anette Guarneri, PharmD [phone]  08/21/2014,1:20 PM

## 2014-08-21 NOTE — Progress Notes (Addendum)
Pt admitted to 2C17 via carelink. Pt is alert and orientated. Not currently complaining of chest pain. MD notified from cardiology when pt arrived and stated that day shift MDs would be around in am to assess pt. Pt was able to get up from stretcher to bed okay. Pt has been NPO since yesterday afternoon.

## 2014-08-22 ENCOUNTER — Inpatient Hospital Stay (HOSPITAL_COMMUNITY): Payer: Medicare Other

## 2014-08-22 ENCOUNTER — Encounter (HOSPITAL_COMMUNITY): Payer: Self-pay | Admitting: Cardiovascular Disease

## 2014-08-22 DIAGNOSIS — I25119 Atherosclerotic heart disease of native coronary artery with unspecified angina pectoris: Secondary | ICD-10-CM | POA: Diagnosis present

## 2014-08-22 DIAGNOSIS — E669 Obesity, unspecified: Secondary | ICD-10-CM | POA: Diagnosis present

## 2014-08-22 DIAGNOSIS — E1065 Type 1 diabetes mellitus with hyperglycemia: Secondary | ICD-10-CM

## 2014-08-22 DIAGNOSIS — I2510 Atherosclerotic heart disease of native coronary artery without angina pectoris: Secondary | ICD-10-CM | POA: Diagnosis present

## 2014-08-22 DIAGNOSIS — I5181 Takotsubo syndrome: Secondary | ICD-10-CM | POA: Diagnosis present

## 2014-08-22 LAB — GLUCOSE, CAPILLARY
GLUCOSE-CAPILLARY: 334 mg/dL — AB (ref 70–99)
GLUCOSE-CAPILLARY: 324 mg/dL — AB (ref 70–99)
GLUCOSE-CAPILLARY: 343 mg/dL — AB (ref 70–99)
Glucose-Capillary: 244 mg/dL — ABNORMAL HIGH (ref 70–99)
Glucose-Capillary: 277 mg/dL — ABNORMAL HIGH (ref 70–99)
Glucose-Capillary: 463 mg/dL — ABNORMAL HIGH (ref 70–99)

## 2014-08-22 LAB — BASIC METABOLIC PANEL
Anion gap: 10 (ref 5–15)
BUN: 15 mg/dL (ref 6–23)
CO2: 21 mmol/L (ref 19–32)
Calcium: 8.1 mg/dL — ABNORMAL LOW (ref 8.4–10.5)
Chloride: 104 mmol/L (ref 96–112)
Creatinine, Ser: 0.84 mg/dL (ref 0.50–1.10)
GFR calc Af Amer: 76 mL/min — ABNORMAL LOW (ref >90)
GFR calc non Af Amer: 66 mL/min — ABNORMAL LOW (ref >90)
Glucose, Bld: 398 mg/dL — ABNORMAL HIGH (ref 70–99)
Potassium: 4.4 mmol/L (ref 3.5–5.1)
Sodium: 135 mmol/L (ref 135–145)

## 2014-08-22 LAB — TROPONIN I
TROPONIN I: 0.08 ng/mL — AB (ref <0.031)
Troponin I: 0.08 ng/mL — ABNORMAL HIGH (ref <0.031)
Troponin I: 0.08 ng/mL — ABNORMAL HIGH (ref <0.031)

## 2014-08-22 LAB — TSH: TSH: 0.676 u[IU]/mL (ref 0.350–4.500)

## 2014-08-22 MED ORDER — SODIUM CHLORIDE 0.9 % IV SOLN
INTRAVENOUS | Status: AC
  Administered 2014-08-22: 14:00:00 via INTRAVENOUS

## 2014-08-22 MED ORDER — PANTOPRAZOLE SODIUM 40 MG IV SOLR
40.0000 mg | INTRAVENOUS | Status: DC
  Administered 2014-08-22 – 2014-08-23 (×2): 40 mg via INTRAVENOUS
  Filled 2014-08-22 (×4): qty 40

## 2014-08-22 MED ORDER — INSULIN GLARGINE 100 UNIT/ML ~~LOC~~ SOLN
25.0000 [IU] | Freq: Every day | SUBCUTANEOUS | Status: DC
  Administered 2014-08-22 – 2014-08-24 (×3): 25 [IU] via SUBCUTANEOUS
  Filled 2014-08-22 (×4): qty 0.25

## 2014-08-22 MED ORDER — PANTOPRAZOLE SODIUM 40 MG PO TBEC: 40.0000 mg | DELAYED_RELEASE_TABLET | Freq: Every day | ORAL | Status: DC

## 2014-08-22 MED ORDER — HYDRALAZINE HCL 20 MG/ML IJ SOLN
5.0000 mg | INTRAMUSCULAR | Status: DC | PRN
Start: 2014-08-22 — End: 2014-08-24
  Filled 2014-08-22: qty 1

## 2014-08-22 MED ORDER — INSULIN ASPART 100 UNIT/ML ~~LOC~~ SOLN
0.0000 [IU] | Freq: Every day | SUBCUTANEOUS | Status: DC
  Administered 2014-08-22: 21:00:00 3 [IU] via SUBCUTANEOUS

## 2014-08-22 MED ORDER — INSULIN ASPART 100 UNIT/ML ~~LOC~~ SOLN
0.0000 [IU] | Freq: Three times a day (TID) | SUBCUTANEOUS | Status: DC
  Administered 2014-08-22: 18:00:00 3 [IU] via SUBCUTANEOUS
  Administered 2014-08-23 (×2): 2 [IU] via SUBCUTANEOUS
  Administered 2014-08-23: 13:00:00 3 [IU] via SUBCUTANEOUS
  Administered 2014-08-24: 1 [IU] via SUBCUTANEOUS
  Administered 2014-08-24: 13:00:00 2 [IU] via SUBCUTANEOUS

## 2014-08-22 MED ORDER — INSULIN ASPART 100 UNIT/ML ~~LOC~~ SOLN
5.0000 [IU] | Freq: Once | SUBCUTANEOUS | Status: AC
  Administered 2014-08-22: 5 [IU] via SUBCUTANEOUS

## 2014-08-22 NOTE — Progress Notes (Signed)
Results for LIZANN, EDELMAN (MRN [id number]) as of 08/22/2014 10:32  Ref. Range 08/21/2014 16:35 08/21/2014 21:59 08/22/2014 00:12 08/22/2014 06:30 08/22/2014 07:41  Glucose-Capillary Latest Range: 70-99 mg/dL 175 (H) 469 (H) 463 (H) 334 (H) 324 (H)  CBGs continue to be greater than 180 mg/dl. Patient takes NPH 25 units every am and NPH 20 units every PM at home. (Total of 45 units) Recommend increasing Lantus to 30-35 units daily or change insulin to NPH home dosage. Continue Novolog correction scale TID. Will continue to follow while in hospital. Harvel Ricks RN BSN CDE

## 2014-08-22 NOTE — Progress Notes (Signed)
Patient ID: [name], female   DOB: 05-23-1941, 74 y.o.   MRN: [id number] 1 Day Post-Op  Subjective: Asked to see patient again today as she had an episode of emesis after eating her lunch today.  She has a little bit of nausea after that, but is better now.  She is still passing flatus and had a BM today as well, but small.  No abdominal pain  Objective: Vital signs in last 24 hours: Temp:  [97.5 F (36.4 C)-98.2 F (36.8 C)] 97.6 F (36.4 C) (03/23 1231) Pulse Rate:  [64-104] 95 (03/23 1231) Resp:  [16-20] 16 (03/23 1231) BP: (106-146)/(46-75) 146/75 mmHg (03/23 1231) SpO2:  [91 %-98 %] 97 % (03/23 1231) Weight:  [91.4 kg (201 lb 8 oz)] 91.4 kg (201 lb 8 oz) (03/23 0500) Last BM Date: 08/21/14  Intake/Output from previous day: 03/22 0701 - 03/23 0700 In: 1167.7 [P.O.:480; I.V.:687.7] Out: 1300 [Urine:1300] Intake/Output this shift: Total I/O In: 120 [P.O.:120] Out: 300 [Urine:300]  PE: Abd: soft, NT, ND, +BS Heart: regular Lungs: CTAB  Lab Results:   Recent Labs  08/20/14 0230 08/21/14 1901  WBC 11.6* 8.4  HGB 10.9* 11.1*  HCT 34.8* 36.2  PLT PLATELET CLUMPS NOTED ON SMEAR, COUNT APPEARS ADEQUATE 282   BMET  Recent Labs  08/20/14 0811 08/21/14 1901 08/22/14 0308  NA 138  --  135  K 3.9  --  4.4  CL 106  --  104  CO2 23  --  21  GLUCOSE 136*  --  398*  BUN 16  --  15  CREATININE 0.74 1.14* 0.84  CALCIUM 8.0*  --  8.1*   PT/INR No results for input(s): LABPROT, INR in the last 72 hours. CMP     Component Value Date/Time   NA 135 08/22/2014 0308   NA 140 06/05/2013 0835   K 4.4 08/22/2014 0308   CL 104 08/22/2014 0308   CO2 21 08/22/2014 0308   GLUCOSE 398* 08/22/2014 0308   GLUCOSE 132* 06/05/2013 0835   BUN 15 08/22/2014 0308   BUN 17 06/05/2013 0835   CREATININE 0.84 08/22/2014 0308   CALCIUM 8.1* 08/22/2014 0308   PROT 5.7* 08/20/2014 0811   PROT 6.3 06/05/2013 0835   ALBUMIN 3.1* 08/20/2014 0811   AST 24 08/20/2014 0811   ALT 13  08/20/2014 0811   ALKPHOS 81 08/20/2014 0811   BILITOT 0.7 08/20/2014 0811   GFRNONAA 66* 08/22/2014 0308   GFRAA 76* 08/22/2014 0308   Lipase     Component Value Date/Time   LIPASE 24 08/19/2014 0223       Studies/Results: No results found.  Anti-infectives: Anti-infectives    None       Assessment/Plan   1. SBO -patient with 1 episode of emesis today after eating a regular diet for lunch.  Will recheck plain films and make further recommendations after evaluation. -will follow. 2. CAD -cath yesterday.  Medical management.  LOS: 3 days    Terecia Plaut E 08/22/2014, 2:35 PM Pager: [phone]

## 2014-08-22 NOTE — Progress Notes (Signed)
Subjective:  No chest pain overnight.  Catheterization yesterday consistent with Takatsubo syndrome.  Feels better and is starting to eat but still covering for small bowel obstruction.  Objective:  Vital Signs in the last 24 hours: BP 143/54 mmHg  Pulse 96  Temp(Src) 97.6 F (36.4 C) (Oral)  Resp 18  Ht 5\' 3"  (1.6 m)  Wt 91.4 kg (201 lb 8 oz)  BMI 35.70 kg/m2  SpO2 98%  Physical Exam: Obese pleasant female in no acute distress Lungs:  Clear Cardiac:  Regular rhythm, normal S1 and S2, no S3 Extremities:  No edema present, radial catheterization site clean and dry  Intake/Output from previous day: 03/22 0701 - 03/23 0700 In: 1167.7 [P.O.:480; I.V.:687.7] Out: 1300 [Urine:1300]  Weight Filed Weights   08/21/14 0400 08/22/14 0013 08/22/14 0500  Weight: 91.9 kg (202 lb 9.6 oz) 91.4 kg (201 lb 8 oz) 91.4 kg (201 lb 8 oz)    Lab Results: Basic Metabolic Panel:  Recent Labs  08/20/14 0811 08/21/14 1901 08/22/14 0308  NA 138  --  135  K 3.9  --  4.4  CL 106  --  104  CO2 23  --  21  GLUCOSE 136*  --  398*  BUN 16  --  15  CREATININE 0.74 1.14* 0.84   CBC:  Recent Labs  08/20/14 0230 08/21/14 1901  WBC 11.6* 8.4  HGB 10.9* 11.1*  HCT 34.8* 36.2  MCV 85.3 86.2  PLT PLATELET CLUMPS NOTED ON SMEAR, COUNT APPEARS ADEQUATE 282   Cardiac Enzymes:   Cardiac Panel (last 3 results)  Recent Labs  08/20/14 2020 08/21/14 0200 08/22/14 0308  TROPONINI 0.75* 0.50* 0.08*    Telemetry: Sinus rhythm  Assessment/Plan:  1.  Clinical event is that of a small bowel obstruction that is resolving 2.  Takatsubo syndrome appears to be resolving 3.  Diabetes 4.  Hypertension  Recommendations:  Clinically she is doing fine.  Suspected severe chest pain was due to esophageal spasm precipitated by swallowing a pill.  This is been a recurrent problem she has had now at least 4 catheterizations demonstrating minimal coronary artery disease.  There has been no ST elevation  during pain so I do not think this is coronary artery spasm.  From a cardiac viewpoint after her small bowel obstruction and is cleared she may go home.  She should continue on calcium channel blockers to prevent esophageal spasm, use nitroglycerin as needed, and make efforts to lose weight.  Kerry Hough  MD [facility] Cardiology  08/22/2014, 9:01 AM

## 2014-08-22 NOTE — Progress Notes (Signed)
PT Cancellation Note  Patient Details Name: [name] MRN: [id number] DOB: [birth year]   Cancelled Treatment:    Reason Eval/Treat Not Completed: Patient at procedure or test/unavailable.   Rolinda Roan 08/22/2014, 2:34 PM   Rolinda Roan, PT, DPT Acute Rehabilitation Services Pager: [phone]

## 2014-08-22 NOTE — Progress Notes (Signed)
PROGRESS NOTE    [name] VHQ:[id number] DOB: Oct 22, 1940 DOA: 08/19/2014 PCP: [name], MD  Primary Endocrinologist: Dr. Roque Cash Primary Cardiologist: Dr. Wynonia Lawman  HPI/Brief narrative 74 year old female patient with history of HTN, type 1 DM, HLD, esophageal dyskinesia, gout, hypothyroidism, s/p gastric bypass surgery 10 years ago, initially admitted to the [facility] on 08/19/14 with complaints of abdominal pain, nausea and nonbloody emesis. She also complained of chest pain radiating to left shoulder and neck. CT abdomen and pelvis showed high-grade SBO. Troponin was elevated. Surgery was consulted and SBO was managed conservatively with initial improvement. Due to ongoing chest pain, patient was transferred to [facility] on 3/21 PM and underwent cardiac catheterization 3/22. She had been advanced to regular consistency diet last night but complains of abdominal distention and an episode of vomiting after lunch today. TRH inadvertently did not follow-up on patient on 3/22 (apologized and informed patient and spouse who verbalized understanding)  Assessment/Plan:  Small bowel obstruction - In patient with history of gastric bypass surgery 10 years ago. - Etiology: Suspected adhesions from prior surgery - Treated conservatively with bowel rest, IV fluids and serial exam and imaging. Clinically improved and diet was advanced to regular consistency diet on 3/22 PM. Vomited again 1 on 3/23 at lunch. - We will get stat abdominal 2 view x-rays to reassess. - Discussed with surgeons who will reevaluate. - May have to downgrade diet to liquids or nothing by mouth except meds until clinically improved and then gradually advance diet. - Change PPI to IV.  Chest pain/elevated troponin/ Takatsubo's syndrome - Patient has history of CAD status post PCI. - Peak troponin of 0.92. - Cardiology consultation and follow-up appreciated. - TTE 08/19/14: LVEF 35-40 percent with severe  hypokinesis of the mid apical anterior and inferior myocardium. - Patient continued to complain of chest pain while at [facility]. She was transferred to [facility] stepdown unit and was started on NTG and heparin infusion. - Left heart cath 3/22 showed mild nonobstructive CAD, mild segmental LV systolic dysfunction with periapical wall motion abnormality and hyperdynamic motion at the base of the heart and normal LVEDP. Suspect Takotsubo's cardiomyopathy and medical therapy recommended. - Cardiology suspects that her severe chest pain was due to esophageal spasm precipitated by swallowing a pill. This apparently has been a recurrent problem and she has had at least 4 catheterizations demonstrating minimal coronary artery disease. - From a cardiac standpoint, she has been cleared to go home. She should continue calcium channel blockers to prevent esophageal spasm.  Uncontrolled type I DM - Hemoglobin A1c on 05/31/14:8.8. - Oral hypoglycemics held in hospital. - Patient was placed on low dose Lantus and NovoLog SSI. - Continues to have significantly high CBGs in the 300's: Likely precipitated by inadequate insulins and Solu-Medrol that she received pre cath - She usually takes 45 units of daily insulin at home. We will increase Lantus from 5 units daily to 25 units daily beginning now (received 5 units this morning) and continue NovoLog SSI with HS scale.  - Titrate insulins and monitor closely. - Diabetes coordinator input appreciated.  Essential hypertension - Reasonably controlled. - Home Diovan HCT currently on hold. We'll consider resuming if blood pressure starts to rise and oral intake is consistent. - Continue PRN IV hydralazine.  Hyperlipidemia - Continue statins.  Anemia - Chronic and stable.  Hypothyroidism - TSH 06/05/13:2.2 - Not on supplements at home. - Recheck TSH.  Chronic diastolic CHF - Clinically compensated. -  Temporarily hold Lasix secondary to GI losses. -  Monitor closely while on gentle IV fluid hydration for SBO.     Code Status: Full Family Communication: discussed with spouse at bedside Disposition Plan: Home when medically stable. Not stable for DC home yet.   Consultants:  General surgery  Cardiology  Procedures:  Left heart cath 08/21/14: Final Conclusions:  1. Mild nonobstructive CAD 2. Mild segmental LV systolic dysfunction with periapical wall motion abnormality and hyperdynamic motion at the base of the heart 3. Normal LVEDP  Recommendations: Supportive medical therapy. Suspect Takotsubo's Cardiomyopathy.   Antibiotics:  None   Subjective: An episode of nonbloody emesis 30 minutes after lunch today. Felt uneasy at breakfast too. Feels some upper abdominal fullness and discomfort. A small BM yesterday and today. Passing small amount of flatus. No chest pain.  Objective: Filed Vitals:   08/22/14 0323 08/22/14 0500 08/22/14 0802 08/22/14 1231  BP: 143/69  143/54 146/75  Pulse: 64  96 95  Temp: 97.5 F (36.4 C)  97.6 F (36.4 C) 97.6 F (36.4 C)  TempSrc: Oral  Oral Oral  Resp: 18  18 16   Height:      Weight:  91.4 kg (201 lb 8 oz)    SpO2: 97%  98% 97%    Intake/Output Summary (Last 24 hours) at 08/22/14 1325 Last data filed at 08/22/14 0817  Gross per 24 hour  Intake 1287.72 ml  Output   1600 ml  Net -312.28 ml   Filed Weights   08/21/14 0400 08/22/14 0013 08/22/14 0500  Weight: 91.9 kg (202 lb 9.6 oz) 91.4 kg (201 lb 8 oz) 91.4 kg (201 lb 8 oz)     Exam:  General exam: Pleasant elderly female sitting comfortably at edge of bed. Respiratory system: Clear. No increased work of breathing. Cardiovascular system: S1 & S2 heard, RRR. No JVD, murmurs, gallops, clicks or pedal edema. Telemetry: Sinus rhythm Gastrointestinal system: Abdomen is appears mildly distended in the upper abdomen with mild epigastric tenderness but no rigidity, guarding or rebound. Midline laparotomy scar. High-pitched  hyperactive bowel sounds heard.  Central nervous system: Alert and oriented. No focal neurological deficits. Extremities: Symmetric 5 x 5 power.   Data Reviewed: Basic Metabolic Panel:  Recent Labs Lab 08/19/14 0223 08/20/14 0811 08/21/14 1901 08/22/14 0308  NA 131* 138  --  135  K 3.6 3.9  --  4.4  CL 95* 106  --  104  CO2 26 23  --  21  GLUCOSE 392* 136*  --  398*  BUN 14 16  --  15  CREATININE 0.78 0.74 1.14* 0.84  CALCIUM 8.7 8.0*  --  8.1*   Liver Function Tests:  Recent Labs Lab 08/19/14 0223 08/20/14 0811  AST 21 24  ALT 14 13  ALKPHOS 113 81  BILITOT 0.9 0.7  PROT 7.2 5.7*  ALBUMIN 3.8 3.1*    Recent Labs Lab 08/19/14 0223  LIPASE 24   No results for input(s): AMMONIA in the last 168 hours. CBC:  Recent Labs Lab 08/19/14 0223 08/20/14 0230 08/21/14 1901  WBC 13.3* 11.6* 8.4  NEUTROABS 11.3*  --   --   HGB 13.0 10.9* 11.1*  HCT 41.6 34.8* 36.2  MCV 84.2 85.3 86.2  PLT 357 PLATELET CLUMPS NOTED ON SMEAR, COUNT APPEARS ADEQUATE 282   Cardiac Enzymes:  Recent Labs Lab 08/20/14 0811 08/20/14 1443 08/20/14 2020 08/21/14 0200 08/22/14 0308  TROPONINI 0.57* 0.92* 0.75* 0.50* 0.08*   BNP (last 3 results)  No results for input(s): PROBNP in the last 8760 hours. CBG:  Recent Labs Lab 08/21/14 2159 08/22/14 0012 08/22/14 0630 08/22/14 0741 08/22/14 1230  GLUCAP 469* 463* 334* 324* 343*    Recent Results (from the past 240 hour(s))  Urine culture     Status: None   Collection Time: 08/19/14  2:04 AM  Result Value Ref Range Status   Specimen Description URINE, RANDOM  Final   Special Requests NONE  Final   Colony Count   Final    40,000 COLONIES/ML Performed at [facility]    Culture   Final    Multiple bacterial morphotypes present, none predominant. Suggest appropriate recollection if clinically indicated. Performed at [facility]    Report Status 08/20/2014 FINAL  Final  MRSA PCR Screening     Status: None     Collection Time: 08/20/14  8:45 PM  Result Value Ref Range Status   MRSA by PCR NEGATIVE NEGATIVE Final    Comment:        The GeneXpert MRSA Assay (FDA approved for NASAL specimens only), is one component of a comprehensive MRSA colonization surveillance program. It is not intended to diagnose MRSA infection nor to guide or monitor treatment for MRSA infections.           Studies: No results found.      Scheduled Meds: . [START ON 08/23/2014] aspirin EC  81 mg Oral Daily  . atorvastatin  40 mg Oral q1800  . cholecalciferol  5,000 Units Oral Daily  . citalopram  40 mg Oral Daily  . heparin  5,000 Units Subcutaneous 3 times per day  . insulin aspart  0-15 Units Subcutaneous TID WC  . insulin glargine  5 Units Subcutaneous Daily  . lip balm  1 application Topical BID  . loratadine  10 mg Oral Daily  . magnesium oxide  400 mg Oral Daily  . [START ON 08/23/2014] pantoprazole  40 mg Oral Daily  . sodium chloride  3 mL Intravenous Q12H  . sodium chloride  3 mL Intravenous Q12H   Continuous Infusions: . sodium chloride 50 mL/hr at 08/21/14 0400  . sodium chloride 75 mL/hr at 08/21/14 4765    Principal Problem:   SBO (small bowel obstruction) Active Problems:   Depression   Lumbago   DM2 (diabetes mellitus, type 2)   HLD (hyperlipidemia)   Essential hypertension, benign   Chronic pain syndrome   Elevated troponin   Recurrent ventral incisional hernia   Obesity (BMI 30-39.9)   CAD (coronary artery disease), native coronary artery   Takotsubo syndrome    Time spent: 45 minutes.    Vernell Leep, MD, FACP, FHM. [facility] Pager [phone]  If 7PM-7AM, please contact night-coverage [email or web address] Password [email or web address] 08/22/2014, 1:25 PM    LOS: 3 days

## 2014-08-23 ENCOUNTER — Inpatient Hospital Stay (HOSPITAL_COMMUNITY): Payer: Medicare Other

## 2014-08-23 DIAGNOSIS — E11351 Type 2 diabetes mellitus with proliferative diabetic retinopathy with macular edema: Secondary | ICD-10-CM

## 2014-08-23 DIAGNOSIS — K56609 Unspecified intestinal obstruction, unspecified as to partial versus complete obstruction: Secondary | ICD-10-CM | POA: Insufficient documentation

## 2014-08-23 LAB — TROPONIN I
TROPONIN I: 0.13 ng/mL — AB (ref <0.031)
TROPONIN I: 0.11 ng/mL — AB (ref <0.031)
Troponin I: 0.08 ng/mL — ABNORMAL HIGH (ref <0.031)
Troponin I: 0.07 ng/mL — ABNORMAL HIGH (ref <0.031)
Troponin I: 0.08 ng/mL — ABNORMAL HIGH (ref <0.031)

## 2014-08-23 LAB — CBC
HCT: 32.9 % — ABNORMAL LOW (ref 36.0–46.0)
Hemoglobin: 10.2 g/dL — ABNORMAL LOW (ref 12.0–15.0)
MCH: 26.8 pg (ref 26.0–34.0)
MCHC: 31 g/dL (ref 30.0–36.0)
MCV: 86.4 fL (ref 78.0–100.0)
PLATELETS: 278 10*3/uL (ref 150–400)
RBC: 3.81 MIL/uL — ABNORMAL LOW (ref 3.87–5.11)
RDW: 16.1 % — AB (ref 11.5–15.5)
WBC: 7.4 10*3/uL (ref 4.0–10.5)

## 2014-08-23 LAB — BASIC METABOLIC PANEL
ANION GAP: 9 (ref 5–15)
BUN: 23 mg/dL (ref 6–23)
CALCIUM: 8.6 mg/dL (ref 8.4–10.5)
CHLORIDE: 107 mmol/L (ref 96–112)
CO2: 24 mmol/L (ref 19–32)
CREATININE: 0.85 mg/dL (ref 0.50–1.10)
GFR calc Af Amer: 75 mL/min — ABNORMAL LOW (ref >90)
GFR calc non Af Amer: 65 mL/min — ABNORMAL LOW (ref >90)
GLUCOSE: 188 mg/dL — AB (ref 70–99)
Potassium: 3.8 mmol/L (ref 3.5–5.1)
Sodium: 140 mmol/L (ref 135–145)

## 2014-08-23 LAB — GLUCOSE, CAPILLARY
GLUCOSE-CAPILLARY: 216 mg/dL — AB (ref 70–99)
Glucose-Capillary: 177 mg/dL — ABNORMAL HIGH (ref 70–99)
Glucose-Capillary: 162 mg/dL — ABNORMAL HIGH (ref 70–99)
Glucose-Capillary: 154 mg/dL — ABNORMAL HIGH (ref 70–99)

## 2014-08-23 MED ORDER — BISACODYL 10 MG RE SUPP
10.0000 mg | Freq: Two times a day (BID) | RECTAL | Status: DC
  Administered 2014-08-23: 14:00:00 10 mg via RECTAL
  Filled 2014-08-23: qty 1

## 2014-08-23 MED ORDER — AMLODIPINE BESYLATE 2.5 MG PO TABS: 2.5000 mg | ORAL_TABLET | Freq: Every day | ORAL | Status: DC

## 2014-08-23 MED ORDER — DOCUSATE SODIUM 100 MG PO CAPS
100.0000 mg | ORAL_CAPSULE | Freq: Two times a day (BID) | ORAL | Status: DC
  Administered 2014-08-23 – 2014-08-24 (×3): 100 mg via ORAL
  Filled 2014-08-23 (×5): qty 1

## 2014-08-23 MED ORDER — CITALOPRAM HYDROBROMIDE 40 MG PO TABS: 20.0000 mg | ORAL_TABLET | Freq: Every day | ORAL | Status: DC

## 2014-08-23 MED ORDER — FLEET ENEMA 7-19 GM/118ML RE ENEM
1.0000 | ENEMA | Freq: Every day | RECTAL | Status: DC | PRN
  Filled 2014-08-23: qty 1

## 2014-08-23 MED ORDER — ATORVASTATIN CALCIUM 40 MG PO TABS: 40.0000 mg | ORAL_TABLET | Freq: Every day | ORAL | Status: DC

## 2014-08-23 MED ORDER — DOCUSATE SODIUM 100 MG PO CAPS: 100.0000 mg | ORAL_CAPSULE | Freq: Two times a day (BID) | ORAL | Status: DC

## 2014-08-23 MED ORDER — CITALOPRAM HYDROBROMIDE 20 MG PO TABS
20.0000 mg | ORAL_TABLET | Freq: Every day | ORAL | Status: DC
  Administered 2014-08-24: 10:00:00 20 mg via ORAL
  Filled 2014-08-23: qty 1

## 2014-08-23 MED ORDER — LIVING WELL WITH DIABETES BOOK
Freq: Once | Status: AC
  Administered 2014-08-23: 22:00:00
  Filled 2014-08-23: qty 1

## 2014-08-23 MED ORDER — POLYETHYLENE GLYCOL 3350 17 G PO PACK: 17.0000 g | PACK | Freq: Every day | ORAL | Status: DC | PRN

## 2014-08-23 NOTE — Progress Notes (Signed)
Physical Therapy Treatment Patient Details Name: [name] MRN: [id number] DOB: 08/11/1940 Today's Date: 08/23/2014    History of Present Illness Admitted to [facility] with abdominal pain, nausea and vomiting due to SBO on 08/19/14.  Transferred to [facility] with CP for impending cardiac catherization. PMH: GERD, fibromyalgia, HTN, asthma, DM, depression, CHF, gastric bypass.    PT Comments    Pt progressing towards physical therapy goals. Pt was able to perform transfers and ambulation with min guard assist at times due to unsteadiness, however pt was grossly able to recover independently. Pt hopeful for d/c home this afternoon. Declined stair training, stating she has no concerns with safety when entering her home. Pt was educated on general safety awareness and energy conservation techniques. Will continue to follow.   Follow Up Recommendations  Home health PT;Supervision/Assistance - 24 hour     Equipment Recommendations  None recommended by PT    Recommendations for Other Services       Precautions / Restrictions Precautions Precautions: Fall Restrictions Weight Bearing Restrictions: No    Mobility  Bed Mobility Overal bed mobility: Modified Independent             General bed mobility comments: No assist required.   Transfers Overall transfer level: Needs assistance Equipment used: None Transfers: Sit to/from Stand Sit to Stand: Supervision         General transfer comment: No assist required, however increased time required due to initial mild unsteadiness.   Ambulation/Gait Ambulation/Gait assistance: Min guard;Supervision Ambulation Distance (Feet): 200 Feet Assistive device: Rolling walker (2 wheeled);None Gait Pattern/deviations: Step-through pattern;Decreased stride length;Trendelenburg Gait velocity: decreased Gait velocity interpretation: Below normal speed for age/gender General Gait Details: Initially pt declined RW and was unsteady while ambulating to the  door (min guard assist required). Once in hall pt agreeable to use RW and was at a supervision level. Pt reports mild SOB at end of gait training, and O2 sats remained in mid-90's.   Stairs            Wheelchair Mobility    Modified Rankin (Stroke Patients Only)       Balance Overall balance assessment: Needs assistance Sitting-balance support: Feet supported;No upper extremity supported Sitting balance-Leahy Scale: Good     Standing balance support: No upper extremity supported;During functional activity Standing balance-Leahy Scale: Fair                      Cognition Arousal/Alertness: Awake/alert Behavior During Therapy: WFL for tasks assessed/performed Overall Cognitive Status: Within Functional Limits for tasks assessed                      Exercises General Exercises - Lower Extremity Ankle Circles/Pumps: 20 reps Quad Sets: 15 reps    General Comments        Pertinent Vitals/Pain Pain Assessment: No/denies pain    Home Living                      Prior Function            PT Goals (current goals can now be found in the care plan section) Acute Rehab PT Goals Patient Stated Goal: Home today PT Goal Formulation: With patient Time For Goal Achievement: 09/04/14 Potential to Achieve Goals: Good Progress towards PT goals: Progressing toward goals    Frequency  Min 3X/week    PT Plan Current plan remains appropriate    Co-evaluation  End of Session Equipment Utilized During Treatment: Gait belt Activity Tolerance: Patient tolerated treatment well Patient left: in chair;with call bell/phone within reach     Time: 0857-0915 PT Time Calculation (min) (ACUTE ONLY): 18 min  Charges:  $Gait Training: 8-22 mins                    G Codes:      Rolinda Roan September 19, 2014, 9:29 AM  Rolinda Roan, PT, DPT Acute Rehabilitation Services Pager: [phone]

## 2014-08-23 NOTE — Progress Notes (Signed)
The nurse informed me that she reportedly vomited all of her lunch in the toilet, but this was flushed before it could be confirmed.  She has clear phlegm in her emesis basin only.  Would not discharge her home if this is the case.  Would recommend giving clear liquids and being more aggressive with her bowel regimen.  She is straining a lot to have a BM so hopefully suppository/enema's will help.  She says she manually disimpacted herself this am.  Repeat KUB.    [name], PA-C General Surgery [facility] [phone]

## 2014-08-23 NOTE — Progress Notes (Signed)
Pt  claimed  vomitted post meal, denied abd'l pain , Dr Darrick Meigs notified, [facility] dort Surgical PA also made aware . Discharge order cancelled , with new orders. See MAR.

## 2014-08-23 NOTE — Progress Notes (Signed)
Subjective:  No chest pain overnight.   Patient vomited once yesterday and was placed back on IV fluids.  No recurrence of chest pain at this time.  Objective:  Vital Signs in the last 24 hours: BP 144/46 mmHg  Pulse 91  Temp(Src) 98.2 F (36.8 C) (Oral)  Resp 18  Ht 5\' 3"  (1.6 m)  Wt 91 kg (200 lb 9.9 oz)  BMI 35.55 kg/m2  SpO2 96%  Physical Exam: Obese pleasant female in no acute distress Lungs:  Clear Cardiac:  Regular rhythm, normal S1 and S2, no S3 Extremities:  No edema present,   Intake/Output from previous day: 03/23 0701 - 03/24 0700 In: 701.7 [P.O.:480; I.V.:221.7] Out: 1450 [Urine:1450]  Weight Filed Weights   08/22/14 0500 08/23/14 0007 08/23/14 0452  Weight: 91.4 kg (201 lb 8 oz) 91 kg (200 lb 9.9 oz) 91 kg (200 lb 9.9 oz)    Lab Results: Basic Metabolic Panel:  Recent Labs  08/22/14 0308 08/23/14 0423  NA 135 140  K 4.4 3.8  CL 104 107  CO2 21 24  GLUCOSE 398* 188*  BUN 15 23  CREATININE 0.84 0.85   CBC:  Recent Labs  08/21/14 1901 08/23/14 0423  WBC 8.4 7.4  HGB 11.1* 10.2*  HCT 36.2 32.9*  MCV 86.2 86.4  PLT 282 278   Cardiac Enzymes:   Cardiac Panel (last 3 results)  Recent Labs  08/22/14 1911 08/22/14 2329 08/23/14 0250  TROPONINI 0.08* 0.13* 0.08*    Telemetry: Sinus rhythm  Assessment/Plan:  1.  Major problem remains small bowel obstruction and some intermittent vomiting. 2.  Takatsubo syndrome appears to be resolving 3.  Diabetes 4.  Hypertension  Recommendations:  The mild elevation of troponin and abnormal echo were Tatkosubo syndrome.  Troponin elevation has been flattened she should receive continued treatment for small bowel obstruction.  Cardiology will sign off right now.  I would need to see her after she is followed up with her primary care doctor following discharge.  Usually a Tatkosubo syndrome should resolve and she previously has had normal LV function.  Kerry Hough  MD  [facility] Cardiology  08/23/2014, 8:47 AM

## 2014-08-23 NOTE — Progress Notes (Signed)
[facility] Progress Note  2 Days Post-Op  Subjective: Pt c/o constipation.  She normally doesn't have a problem with it, but the past few days she's had to manually disimpact herself.  She has no further nausea/vomiting.  She's tolerating her diet well.  Ambulating well.  Objective: Vital signs in last 24 hours: Temp:  [97.6 F (36.4 C)-98.6 F (37 C)] 98.1 F (36.7 C) (03/24 0812) Pulse Rate:  [75-95] 91 (03/24 0812) Resp:  [16-20] 18 (03/24 0812) BP: (127-152)/(35-76) 144/46 mmHg (03/24 0812) SpO2:  [93 %-97 %] 95 % (03/24 0812) Weight:  [91 kg (200 lb 9.9 oz)] 91 kg (200 lb 9.9 oz) (03/24 0452) Last BM Date: 08/21/14  Intake/Output from previous day: 2022-08-30 0701 - 03/24 0700 In: 701.7 [P.O.:480; I.V.:221.7] Out: 1450 [Urine:1450] Intake/Output this shift: Total I/O In: 60 [P.O.:60] Out: 150 [Urine:150]  PE: Gen:  Alert, NAD, pleasant Card:  RRR, no M/G/R heard Pulm:  CTA, no W/R/R Abd: Obese, soft, NT/ND, +BS, no HSM, large soft ventral hernia palpated  Lab Results:   Recent Labs  08/21/14 1901 08/23/14 0423  WBC 8.4 7.4  HGB 11.1* 10.2*  HCT 36.2 32.9*  PLT 282 278   BMET  Recent Labs  08/30/2014 0308 08/23/14 0423  NA 135 140  K 4.4 3.8  CL 104 107  CO2 21 24  GLUCOSE 398* 188*  BUN 15 23  CREATININE 0.84 0.85  CALCIUM 8.1* 8.6   PT/INR No results for input(s): LABPROT, INR in the last 72 hours. CMP     Component Value Date/Time   NA 140 08/23/2014 0423   NA 140 06/05/2013 0835   K 3.8 08/23/2014 0423   CL 107 08/23/2014 0423   CO2 24 08/23/2014 0423   GLUCOSE 188* 08/23/2014 0423   GLUCOSE 132* 06/05/2013 0835   BUN 23 08/23/2014 0423   BUN 17 06/05/2013 0835   CREATININE 0.85 08/23/2014 0423   CALCIUM 8.6 08/23/2014 0423   PROT 5.7* 08/20/2014 0811   PROT 6.3 06/05/2013 0835   ALBUMIN 3.1* 08/20/2014 0811   AST 24 08/20/2014 0811   ALT 13 08/20/2014 0811   ALKPHOS 81 08/20/2014 0811   BILITOT 0.7 08/20/2014 0811   GFRNONAA 65* 08/23/2014 0423   GFRAA 75* 08/23/2014 0423   Lipase     Component Value Date/Time   LIPASE 24 08/19/2014 0223       Studies/Results: Dg Abd 2 Views  08/30/14   CLINICAL DATA:  Vomiting, chest and stomach pain, recent MI, prior gastric bypass, hypertension, diabetes  EXAM: ABDOMEN - 2 VIEW  COMPARISON:  08/20/2014  FINDINGS: Retained contrast in colon.  Persistent mild dilatation of a few small bowel loops in the LEFT mid abdomen.  Remaining bowel loops decompressed.  RIGHT basilar atelectasis and question pleural effusion.  Heart appears enlarged.  Bones demineralized.  Question prior ventral hernia repair.  No definite urinary tract calcification.  IMPRESSION: Persistent mild dilatation of a few small bowel loops in the LEFT mid abdomen.  Little interval change.   Electronically Signed   By: Lavonia Dana M.D.   On: 08-30-2014 15:30    Anti-infectives: Anti-infectives    None       Assessment/Plan SBO -Patients abdomen is chronically large given her chronic ventral hernia, abdomen is very soft today -Pt tolerated soft diet.   -Still having trouble with constipation (she manually removes stool herself) -Recommend colace BID and miralax qd prn until normal bowel movements return -Increase water intake and  mobility as able -Okay from our perspective to d/c home per medical service CAD -cath 08/21/14. Medical management.    LOS: 4 days    [name] 08/23/2014, 10:26 AM Pager: [phone]

## 2014-08-23 NOTE — Progress Notes (Signed)
[facility] PROGRESS NOTE  [name] GYB:[id number] DOB: 27-Sep-1940 DOA: 08/19/2014 PCP: [name], MD  Assessment/Plan: Small bowel obstruction - patient with history of gastric bypass surgery 10 years ago. - Etiology: Suspected adhesions from prior surgery - Treated conservatively with bowel rest, IV fluids and serial exam and imaging. Clinically improved and diet was advanced to regular consistency diet on 3/22 PM. Vomited again 1 on 3/23 at lunch. - patient moved bowels this morning. Surgery has seen and recommend to discharge home on colace and miralax prn. Patient had one episode of vomiting, discharge has been canceled. Fleets enema ordered by surgery.  Chest pain/elevated troponin/ Takatsubo's syndrome - Patient has history of CAD status post PCI. - Peak troponin of 0.92. - Cardiology consultation and follow-up appreciated. - TTE 08/19/14: LVEF 35-40 percent with severe hypokinesis of the mid apical anterior and inferior myocardium. - Patient continued to complain of chest pain while at [facility]. She was transferred to [facility] stepdown unit and was started on NTG and heparin infusion. - Left heart cath 3/22 showed mild nonobstructive CAD, mild segmental LV systolic dysfunction with periapical wall motion abnormality and hyperdynamic motion at the base of the heart and normal LVEDP. Suspect Takotsubo's cardiomyopathy and medical therapy recommended. - Cardiology suspects that her severe chest pain was due to esophageal spasm precipitated by swallowing a pill. This apparently has been a recurrent problem and she has had at least 4 catheterizations demonstrating minimal coronary artery disease. - From a cardiac standpoint, she has been cleared to go home. She should continue calcium channel blockers to prevent esophageal spasm. Will start low dose amlodipine 2.5 mg po daily. Follow up cardiology in 4 weeks  Uncontrolled type I DM - Hemoglobin A1c on  05/31/14:8.8. - Oral hypoglycemics held in hospital. - Patient was placed on low dose Lantus and NovoLog SSI. - Continues to have significantly high CBGs in the 300's: Likely precipitated by inadequate insulins and Solu-Medrol that she received pre cath - She usually takes 45 units of daily insulin at home. We increased Lantus from 5 units daily to 25 units daily beginning now (received 5 units this morning) and continue NovoLog SSI with HS scale.  - Will continue the NPH 25 units am and 20 units pm at the time of discharge  Essential hypertension - Reasonably controlled. -  Hyperlipidemia - Continue statins.  Anemia - Chronic and stable.  Hypothyroidism - TSH 06/05/13:2.2 - Not on supplements at home. - Recheck TSH was 8.768  Chronic diastolic CHF - Clinically compensated.  Code Status: Full code Family Communication: No family at bedside Disposition Plan: Home when stable   Consultants:  Cardiology  Surgery  Procedures:  Cardiac catheterization  Antibiotics:  None  HPI/Subjective: *74 y.o. female with past medical history for GERD, hyperlipidemia, gout, hypertension, hypothyroidism, asthma, diabetes, depression, diastolic congestive heart failure, history of gastric bypass surgery 10 years ago, stent placement, who presents with abdominal pain, nausea and vomiting.  Patient's abdominal pain started 5 hours ago prior to ED arrival. The pain is located in her upper abdomen and radiates to her chest. The pain is described as cramping and severe. The abdominal pain has been progressively getting worse. It is associated with nausea and multiple vomiting, without blood in vomitus. Patient suspects she has food poisoning after eating at a [employer]. She does not have diarrhea.  Patient also reports having mild chest pain. It is located in the substernal area, constant, radiating to the left shoulder  and the neck. Patient does not have cough, shortness of breath. No  fever or chills. Patient reports having dysuria in the past several days, but no burning or increased urinary frequency. Patient denies fever, chills, headaches, cough, SOB, hematuria, skin rashes or leg swelling. No unilateral weakness, numbness or tingling sensations. No vision change or hearing loss.  Patient was to be discharged this morning, but had one episode of vomiting after she ate her lunch. Discharge has been canceled for today.   Objective: Filed Vitals:   08/23/14 0812  BP: 144/46  Pulse: 91  Temp: 98.1 F (36.7 C)  Resp: 18    Intake/Output Summary (Last 24 hours) at 08/23/14 1405 Last data filed at 08/23/14 1326  Gross per 24 hour  Intake 961.67 ml  Output   1400 ml  Net -438.33 ml   Filed Weights   08/22/14 0500 08/23/14 0007 08/23/14 0452  Weight: 91.4 kg (201 lb 8 oz) 91 kg (200 lb 9.9 oz) 91 kg (200 lb 9.9 oz)    Exam:   General: Appears in no acute distress  Cardiovascular: S1-S2 is regular  Respiratory: Clear to auscultation bilaterally  Abdomen: Soft, nontender, no organomegaly  Musculoskeletal: No edema noted in the lower extremities   Data Reviewed: Basic Metabolic Panel:  Recent Labs Lab 08/19/14 0223 08/20/14 0811 08/21/14 1901 08/22/14 0308 08/23/14 0423  NA 131* 138  --  135 140  K 3.6 3.9  --  4.4 3.8  CL 95* 106  --  104 107  CO2 26 23  --  21 24  GLUCOSE 392* 136*  --  398* 188*  BUN 14 16  --  15 23  CREATININE 0.78 0.74 1.14* 0.84 0.85  CALCIUM 8.7 8.0*  --  8.1* 8.6   Liver Function Tests:  Recent Labs Lab 08/19/14 0223 08/20/14 0811  AST 21 24  ALT 14 13  ALKPHOS 113 81  BILITOT 0.9 0.7  PROT 7.2 5.7*  ALBUMIN 3.8 3.1*    Recent Labs Lab 08/19/14 0223  LIPASE 24   No results for input(s): AMMONIA in the last 168 hours. CBC:  Recent Labs Lab 08/19/14 0223 08/20/14 0230 08/21/14 1901 08/23/14 0423  WBC 13.3* 11.6* 8.4 7.4  NEUTROABS 11.3*  --   --   --   HGB 13.0 10.9* 11.1* 10.2*  HCT 41.6  34.8* 36.2 32.9*  MCV 84.2 85.3 86.2 86.4  PLT 357 PLATELET CLUMPS NOTED ON SMEAR, COUNT APPEARS ADEQUATE 282 278   Cardiac Enzymes:  Recent Labs Lab 08/22/14 1220 08/22/14 1911 08/22/14 2329 08/23/14 0250 08/23/14 0830  TROPONINI 0.08* 0.08* 0.13* 0.08* 0.07*   BNP (last 3 results)  Recent Labs  05/28/14 0246 08/19/14 0744  BNP 19.4 363.4*    ProBNP (last 3 results) No results for input(s): PROBNP in the last 8760 hours.  CBG:  Recent Labs Lab 08/22/14 1230 08/22/14 1734 08/22/14 2045 08/23/14 0638 08/23/14 1215  GLUCAP 343* 244* 277* 177* 216*    Recent Results (from the past 240 hour(s))  Urine culture     Status: None   Collection Time: 08/19/14  2:04 AM  Result Value Ref Range Status   Specimen Description URINE, RANDOM  Final   Special Requests NONE  Final   Colony Count   Final    40,000 COLONIES/ML Performed at [facility]    Culture   Final    Multiple bacterial morphotypes present, none predominant. Suggest appropriate recollection if clinically indicated. Performed at [facility]    Report Status 08/20/2014 FINAL  Final  MRSA PCR Screening     Status: None   Collection Time: 08/20/14  8:45 PM  Result Value Ref Range Status   MRSA by PCR NEGATIVE NEGATIVE Final    Comment:        The GeneXpert MRSA Assay (FDA approved for NASAL specimens only), is one component of a comprehensive MRSA colonization surveillance program. It is not intended to diagnose MRSA infection nor to guide or monitor treatment for MRSA infections.      Studies: Dg Abd 2 Views  08/22/2014   CLINICAL DATA:  Vomiting, chest and stomach pain, recent MI, prior gastric bypass, hypertension, diabetes  EXAM: ABDOMEN - 2 VIEW  COMPARISON:  08/20/2014  FINDINGS: Retained contrast in colon.  Persistent mild dilatation of a few small bowel loops in the LEFT mid abdomen.  Remaining bowel loops decompressed.  RIGHT basilar atelectasis and question pleural  effusion.  Heart appears enlarged.  Bones demineralized.  Question prior ventral hernia repair.  No definite urinary tract calcification.  IMPRESSION: Persistent mild dilatation of a few small bowel loops in the LEFT mid abdomen.  Little interval change.   Electronically Signed   By: Lavonia Dana M.D.   On: 08/22/2014 15:30    Scheduled Meds: . aspirin EC  81 mg Oral Daily  . atorvastatin  40 mg Oral q1800  . bisacodyl  10 mg Rectal BID  . cholecalciferol  5,000 Units Oral Daily  . citalopram  40 mg Oral Daily  . docusate sodium  100 mg Oral BID  . heparin  5,000 Units Subcutaneous 3 times per day  . insulin aspart  0-5 Units Subcutaneous QHS  . insulin aspart  0-9 Units Subcutaneous TID WC  . insulin glargine  25 Units Subcutaneous Daily  . lip balm  1 application Topical BID  . loratadine  10 mg Oral Daily  . magnesium oxide  400 mg Oral Daily  . pantoprazole (PROTONIX) IV  40 mg Intravenous Q24H  . sodium chloride  3 mL Intravenous Q12H  . sodium chloride  3 mL Intravenous Q12H   Continuous Infusions:   Principal Problem:   SBO (small bowel obstruction) Active Problems:   Depression   Lumbago   DM2 (diabetes mellitus, type 2)   HLD (hyperlipidemia)   Essential hypertension, benign   Chronic pain syndrome   Elevated troponin   Recurrent ventral incisional hernia   Obesity (BMI 30-39.9)   CAD (coronary artery disease), native coronary artery   Takotsubo syndrome    Time spent: 25 min    [facility] Pager [phone]. If 7PM-7AM, please contact night-coverage at [email or web address], password [facility] 08/23/2014, 2:05 PM  LOS: 4 days

## 2014-08-23 NOTE — Discharge Summary (Addendum)
Physician Discharge Summary  [name] WUJ:[id number] DOB: 03/11/1941 DOA: 08/19/2014  PCP: [name], MD  Admit date: 08/19/2014 Discharge date: 08/23/2014  Time spent: 25* minutes  Recommendations for Outpatient Follow-up:  1. Follow up PCP in 2 weeks 2. Follow up cardiology in 4 weeks  Discharge Diagnoses:  Principal Problem:   SBO (small bowel obstruction) Active Problems:   Depression   Lumbago   DM2 (diabetes mellitus, type 2)   HLD (hyperlipidemia)   Essential hypertension, benign   Chronic pain syndrome   Elevated troponin   Recurrent ventral incisional hernia   Obesity (BMI 30-39.9)   CAD (coronary artery disease), native coronary artery   Takotsubo syndrome   Discharge Condition: Stable  Diet recommendation: Low salt diet  Filed Weights   08/22/14 0500 08/23/14 0007 08/23/14 0452  Weight: 91.4 kg (201 lb 8 oz) 91 kg (200 lb 9.9 oz) 91 kg (200 lb 9.9 oz)    History of present illness:  74 y.o. female with past medical history for GERD, hyperlipidemia, gout, hypertension, hypothyroidism, asthma, diabetes, depression, diastolic congestive heart failure, history of gastric bypass surgery 10 years ago, stent placement, who presents with abdominal pain, nausea and vomiting.  Patient's abdominal pain started 5 hours ago prior to ED arrival. The pain is located in her upper abdomen and radiates to her chest. The pain is described as cramping and severe. The abdominal pain has been progressively getting worse. It is associated with nausea and multiple vomiting, without blood in vomitus. Patient suspects she has food poisoning after eating at a [employer]. She does not have diarrhea.  Patient also reports having mild chest pain. It is located in the substernal area, constant, radiating to the left shoulder and the neck. Patient does not have cough, shortness of breath. No fever or chills. Patient reports having dysuria in the past several days, but no burning or  increased urinary frequency. Patient denies fever, chills, headaches, cough, SOB, hematuria, skin rashes or leg swelling. No unilateral weakness, numbness or tingling sensations. No vision change or hearing loss.  In ED, patient was found to have high-grade small bowel obstruction by CT abdomen/pelvis. Chest x-ray showed mild vascular congestion, no pneumonia. Elevated trop at 1.08. EKG showed nonspecific T-wave flattening, QTC 486, CBC 13.3, negative urinalysis, lipase 24, temperature 98.3, no tachycardia, electrolytes okay. Patient is admitted to inpatient for further evaluation and treatment. General surgery was consulted by ED.  Hospital Course:  Small bowel obstruction - patient with history of gastric bypass surgery 10 years ago. - Etiology: Suspected adhesions from prior surgery - Treated conservatively with bowel rest, IV fluids and serial exam and imaging. Clinically improved and diet was advanced to regular consistency diet on 3/22 PM. Vomited again 1 on 3/23 at lunch. - patient moved bowels this morning. Surgery has seen and recommend to discharge home on colace and miralax prn.  Chest pain/elevated troponin/ Takatsubo's syndrome - Patient has history of CAD status post PCI. - Peak troponin of 0.92. - Cardiology consultation and follow-up appreciated. - TTE 08/19/14: LVEF 35-40 percent with severe hypokinesis of the mid apical anterior and inferior myocardium. - Patient continued to complain of chest pain while at [facility]. She was transferred to [facility] stepdown unit and was started on NTG and heparin infusion. - Left heart cath 3/22 showed mild nonobstructive CAD, mild segmental LV systolic dysfunction with periapical wall motion abnormality and hyperdynamic motion at the base of the heart and normal LVEDP. Suspect Takotsubo's cardiomyopathy  and medical therapy recommended. - Cardiology suspects that her severe chest pain was due to esophageal spasm precipitated by swallowing  a pill. This apparently has been a recurrent problem and she has had at least 4 catheterizations demonstrating minimal coronary artery disease. - From a cardiac standpoint, she has been cleared to go home. She should continue calcium channel blockers to prevent esophageal spasm. Will start low dose amlodipine 2.5 mg po daily. Follow up cardiology in 4 weeks  Uncontrolled type I DM - Hemoglobin A1c on 05/31/14:8.8. - Oral hypoglycemics held in hospital. - Patient was placed on low dose Lantus and NovoLog SSI. - Continues to have significantly high CBGs in the 300's: Likely precipitated by inadequate insulins and Solu-Medrol that she received pre cath - She usually takes 45 units of daily insulin at home. We increased Lantus from 5 units daily to 25 units daily beginning now (received 5 units this morning) and continue NovoLog SSI with HS scale.  - Will continue the NPH 25 units am and 20 units pm  Essential hypertension - Reasonably controlled. -  Hyperlipidemia - Continue statins.  Anemia - Chronic and stable.  Hypothyroidism - TSH 06/05/13:2.2 - Not on supplements at home. - Recheck TSH was 1.027  Chronic diastolic CHF - Clinically compensated.  Prolonged Qtc Ekg showed mild prolonged Qtc 497, will change the dose of Celexa to 20 mg po daily.    Procedures Left heart catheterization  Consultations:  Surgery  cardiology  Discharge Exam: Filed Vitals:   08/23/14 0812  BP: 144/46  Pulse: 91  Temp: 98.1 F (36.7 C)  Resp: 18    General: Appear in no acute distress Cardiovascular: S1s2 RRR Respiratory: Clear bilaterally  Discharge Instructions   Discharge Instructions    Diet - low sodium heart healthy    Complete by:  As directed      Increase activity slowly    Complete by:  As directed           Current Discharge Medication List    START taking these medications   Details  atorvastatin (LIPITOR) 40 MG tablet Take 1 tablet (40 mg total) by mouth  daily at 6 PM. Qty: 30 tablet, Refills: 2    docusate sodium (COLACE) 100 MG capsule Take 1 capsule (100 mg total) by mouth 2 (two) times daily. Qty: 30 capsule, Refills: 0    polyethylene glycol (MIRALAX) packet Take 17 g by mouth daily as needed for mild constipation. Qty: 14 each, Refills: 0      CONTINUE these medications which have NOT CHANGED   Details  amoxicillin (AMOXIL) 500 MG capsule 2 capsules daily. Prior to dental work Refills: 0    aspirin 81 MG tablet Take 81 mg by mouth daily.     cetirizine (ZYRTEC) 10 MG tablet Take 10 mg by mouth daily as needed for allergies. For allergies    Cholecalciferol (VITAMIN D3) 5000 UNITS CAPS Take 1 tablet by mouth daily. For supplement    citalopram (CELEXA) 40 MG tablet Take 1 tablet (40 mg total) by mouth daily. Qty: 90 tablet, Refills: 3    furosemide (LASIX) 40 MG tablet One each morning to treat edema Qty: 30 tablet, Refills: 3   Associated Diagnoses: Edema    glipiZIDE (GLUCOTROL XL) 10 MG 24 hr tablet Take 1 tablet (10 mg total) by mouth 2 (two) times daily. Take in the morning and evening to control blood sugar Qty: 180 tablet, Refills: 4   Associated Diagnoses: Type II or  unspecified type diabetes mellitus without mention of complication, not stated as uncontrolled    insulin NPH (HUMULIN N,NOVOLIN N) 100 UNIT/ML injection 25 units in the morning and 20 units in the evening to control diabetes.    Magnesium Oxide 250 MG TABS Take 250 mg by mouth daily.     nitroGLYCERIN (NITROSTAT) 0.4 MG SL tablet Place 1 tablet (0.4 mg total) under the tongue every 5 (five) minutes as needed. For chest pain. Maximum of 3 tablets in 15 minutes. Qty: 25 tablet, Refills: 4   Associated Diagnoses: Angina decubitus    PRILOSEC 40 MG capsule TAKE 1 CAPSULE TWICE DAILY FOR ACID REFLUX Qty: 180 capsule, Refills: 0    valsartan-hydrochlorothiazide (DIOVAN HCT) 80-12.5 MG per tablet Take 1 tablet by mouth daily. Qty: 90 tablet, Refills: 3     chlorpheniramine-HYDROcodone (TUSSIONEX) 10-8 MG/5ML LQCR Take 5 mLs by mouth every 12 (twelve) hours. Take scheduled for 1 week and then as needed for cough. Qty: 473 mL, Refills: 0       Allergies  Allergen Reactions  . Ciprofloxacin Rash  . Advair Diskus [Fluticasone-Salmeterol] Other (See Comments)    Throat closes  . Albuterol     REACTION: closes throat  . Alendronate Sodium Itching  . Benadryl [Diphenhydramine Hcl]   . Benzonatate     REACTION: rash/hives  . Cephalexin Nausea Only  . Gabapentin Other (See Comments)    Loss of memory  . Iohexol      Desc: rash and DIF BREATHING   . Keflex [Cephalexin]   . Morphine Nausea And Vomiting  . Other     Decongestants  . Propoxyphene Hcl   . Reclast [Zoledronic Acid] Other (See Comments)    Chest pain  . Avapro [Irbesartan] Rash  . Codeine Nausea And Vomiting, Swelling and Rash  . Tessalon Perles Rash  . Tramadol Nausea Only and Rash      The results of significant diagnostics from this hospitalization (including imaging, microbiology, ancillary and laboratory) are listed below for reference.    Significant Diagnostic Studies: Dg Chest 2 View  08/19/2014   CLINICAL DATA:  Chest pain, nausea and vomiting  EXAM: CHEST  2 VIEW  COMPARISON:  06/11/2014  FINDINGS: There is mild vascular prominence without interstitial or alveolar edema. There are no airspace opacities. There are no effusions. There is chronic compression of a lower thoracic vertebral body, unchanged.  IMPRESSION: Mild vascular congestion without interstitial or alveolar edema.   Electronically Signed   By: Andreas Newport M.D.   On: 08/19/2014 02:51   Ct Abdomen Pelvis W Contrast  08/19/2014   CLINICAL DATA:  Chest pain, nausea, vomiting, diarrhea for 5 hours.  EXAM: CT ABDOMEN AND PELVIS WITH CONTRAST  TECHNIQUE: Multidetector CT imaging of the abdomen and pelvis was performed using the standard protocol following bolus administration of intravenous  contrast.  CONTRAST:  64mL OMNIPAQUE IOHEXOL 300 MG/ML SOLN, 170mL OMNIPAQUE IOHEXOL 300 MG/ML SOLN  COMPARISON:  01/24/2009  FINDINGS: There is a high-grade obstruction of the mid jejunum, with marked dilatation of bowel proximal to the transition point. The transition point is in the mid abdomen just to the left of midline, probably in close proximity to the roux-en-Y anastomosis. There is a large ventral hernia in the same area, but the hernia does not appear to be related to the obstruction. The distal jejunum and the entire ileum are decompressed. The colon is decompressed. There is no extraluminal air. There are unremarkable appearances of the gastrojejunostomy.  There are normal appearances of the liver with the exception of a tiny benign hypodensity which is unchanged from 2010. There are normal appearances of the spleen, pancreas, adrenals and kidneys with the exception of a few benign appearing renal cysts. The abdominal aorta is normal in caliber with mild atherosclerotic calcification. Gallbladder and bile ducts are remarkable only for a few small calculi in the gallbladder lumen. There is no bile duct dilatation.  There is hysterectomy.  No significant abnormality is evident in the lower chest.  IMPRESSION: *High-grade small bowel obstruction in the mid jejunum, most likely due to adhesion. *Large ventral hernia which is not the etiology of the bowel obstruction. *Unremarkable gastric jejunostomy *Cholelithiasis   Electronically Signed   By: Andreas Newport M.D.   On: 08/19/2014 04:48   Dg Abd 2 Views  08/22/2014   CLINICAL DATA:  Vomiting, chest and stomach pain, recent MI, prior gastric bypass, hypertension, diabetes  EXAM: ABDOMEN - 2 VIEW  COMPARISON:  08/20/2014  FINDINGS: Retained contrast in colon.  Persistent mild dilatation of a few small bowel loops in the LEFT mid abdomen.  Remaining bowel loops decompressed.  RIGHT basilar atelectasis and question pleural effusion.  Heart appears  enlarged.  Bones demineralized.  Question prior ventral hernia repair.  No definite urinary tract calcification.  IMPRESSION: Persistent mild dilatation of a few small bowel loops in the LEFT mid abdomen.  Little interval change.   Electronically Signed   By: Lavonia Dana M.D.   On: 08/22/2014 15:30   Dg Abd 2 Views  08/20/2014   CLINICAL DATA:  Follow up partial small bowel obstruction.  EXAM: ABDOMEN - 2 VIEW  COMPARISON:  08/19/2014  FINDINGS: All of the oral contrast has progressed through to the colon. There is moderate dilatation of mid abdominal small bowel loops without significant interval change. No free air.  IMPRESSION: Persistent dilatation of mid abdominal small bowel loops. All of the oral contrast is now in the colon.   Electronically Signed   By: Andreas Newport M.D.   On: 08/20/2014 06:24   Dg Abd 2 Views  08/19/2014   CLINICAL DATA:  PT C/O CONTINUED ABD DISCOMFORT, WITH SOME RELIEF, NO BM X 1 DAY. PT REPORTS SHE CAME TO ED YESTERDAY FOR N/V, ABD CRAMPING, DIAGNOSED/ADMITTED FOR SBO  EXAM: ABDOMEN - 2 VIEW  COMPARISON:  CT, 07/2014 at 4:02 a.m.  FINDINGS: Dilated loops of central small bowel are noted in the left mid abdomen. Bowel dilation may be minimally improved. It has not worsened. Contrast is seen more distal small bowel, mostly in the pelvis.  No free air.  Hernia mesh overlies much of the right abdomen. Soft tissues otherwise unremarkable.  IMPRESSION: 1. There still prominent loops of small bowel in the left mid abdomen. The degree of dilation may be minimally improved. Contrast is also extended from the more proximal small bowel into the distal small bowel. Findings remain consistent with a partial small bowel obstruction. 2. No free air.   Electronically Signed   By: Lajean Manes M.D.   On: 08/19/2014 09:44    Microbiology: Recent Results (from the past 240 hour(s))  Urine culture     Status: None   Collection Time: 08/19/14  2:04 AM  Result Value Ref Range Status    Specimen Description URINE, RANDOM  Final   Special Requests NONE  Final   Colony Count   Final    40,000 COLONIES/ML Performed at [employer] [facility]  Final    Multiple bacterial morphotypes present, none predominant. Suggest appropriate recollection if clinically indicated. Performed at [facility]    Report Status 08/20/2014 FINAL  Final  MRSA PCR Screening     Status: None   Collection Time: 08/20/14  8:45 PM  Result Value Ref Range Status   MRSA by PCR NEGATIVE NEGATIVE Final    Comment:        The GeneXpert MRSA Assay (FDA approved for NASAL specimens only), is one component of a comprehensive MRSA colonization surveillance program. It is not intended to diagnose MRSA infection nor to guide or monitor treatment for MRSA infections.      Labs: Basic Metabolic Panel:  Recent Labs Lab 08/19/14 0223 08/20/14 0811 08/21/14 1901 08/22/14 0308 08/23/14 0423  NA 131* 138  --  135 140  K 3.6 3.9  --  4.4 3.8  CL 95* 106  --  104 107  CO2 26 23  --  21 24  GLUCOSE 392* 136*  --  398* 188*  BUN 14 16  --  15 23  CREATININE 0.78 0.74 1.14* 0.84 0.85  CALCIUM 8.7 8.0*  --  8.1* 8.6   Liver Function Tests:  Recent Labs Lab 08/19/14 0223 08/20/14 0811  AST 21 24  ALT 14 13  ALKPHOS 113 81  BILITOT 0.9 0.7  PROT 7.2 5.7*  ALBUMIN 3.8 3.1*    Recent Labs Lab 08/19/14 0223  LIPASE 24   No results for input(s): AMMONIA in the last 168 hours. CBC:  Recent Labs Lab 08/19/14 0223 08/20/14 0230 08/21/14 1901 08/23/14 0423  WBC 13.3* 11.6* 8.4 7.4  NEUTROABS 11.3*  --   --   --   HGB 13.0 10.9* 11.1* 10.2*  HCT 41.6 34.8* 36.2 32.9*  MCV 84.2 85.3 86.2 86.4  PLT 357 PLATELET CLUMPS NOTED ON SMEAR, COUNT APPEARS ADEQUATE 282 278   Cardiac Enzymes:  Recent Labs Lab 08/22/14 1220 08/22/14 1911 08/22/14 2329 08/23/14 0250 08/23/14 0830  TROPONINI 0.08* 0.08* 0.13* 0.08* 0.07*   BNP: BNP (last 3 results)  Recent Labs   05/28/14 0246 08/19/14 0744  BNP 19.4 363.4*    ProBNP (last 3 results) No results for input(s): PROBNP in the last 8760 hours.  CBG:  Recent Labs Lab 08/22/14 0741 08/22/14 1230 08/22/14 1734 08/22/14 2045 08/23/14 0638  GLUCAP 324* 343* 244* 277* 177*       Signed:  Solyana Nonaka S  [facility] 08/23/2014, 10:55 AM

## 2014-08-24 LAB — GLUCOSE, CAPILLARY
Glucose-Capillary: 144 mg/dL — ABNORMAL HIGH (ref 70–99)
Glucose-Capillary: 152 mg/dL — ABNORMAL HIGH (ref 70–99)

## 2014-08-24 MED ORDER — PANTOPRAZOLE SODIUM 40 MG PO TBEC: 40.0000 mg | DELAYED_RELEASE_TABLET | Freq: Every day | ORAL | Status: DC

## 2014-08-24 MED ORDER — BISACODYL 10 MG RE SUPP: 10.0000 mg | Freq: Two times a day (BID) | RECTAL | Status: AC

## 2014-08-24 NOTE — Progress Notes (Signed)
Medicare Important Message given? YES (If response is "NO", the following Medicare IM given date fields will be blank) Date Medicare IM given:08/23/2014 Medicare IM given by: Whitman Hero

## 2014-08-24 NOTE — Progress Notes (Signed)
[facility] Progress Note  3 Days Post-Op  Subjective: Pt vomited yesterday, but tolerating clears well.  Ambulating well.  Had several BM's yesterday and feels less bloated.  She says the solid food felt like it stayed in her stomach.  Urinating normally.   Objective: Vital signs in last 24 hours: Temp:  [97.5 F (36.4 C)-98.5 F (36.9 C)] 97.9 F (36.6 C) (03/25 0510) Pulse Rate:  [64-91] 64 (03/25 0510) Resp:  [16-20] 20 (03/25 0510) BP: (131-155)/(46-65) 155/56 mmHg (03/25 0510) SpO2:  [95 %-100 %] 96 % (03/25 0510) Weight:  [91 kg (200 lb 9.9 oz)] 91 kg (200 lb 9.9 oz) (03/25 0500) Last BM Date: 08/24/2014  Intake/Output from previous day: 08-24-22 0701 - 03/25 0700 In: 860 [P.O.:860] Out: 1070 [Urine:970; Emesis/NG output:100] Intake/Output this shift:    PE: Gen:  Alert, NAD, pleasant Abd: Obese, soft, NT/ND, +BS, no HSM, large soft ventral hernia palpated   Lab Results:   Recent Labs  08/21/14 1901 August 24, 2014 0423  WBC 8.4 7.4  HGB 11.1* 10.2*  HCT 36.2 32.9*  PLT 282 278   BMET  Recent Labs  08/22/14 0308 2014/08/24 0423  NA 135 140  K 4.4 3.8  CL 104 107  CO2 21 24  GLUCOSE 398* 188*  BUN 15 23  CREATININE 0.84 0.85  CALCIUM 8.1* 8.6   PT/INR No results for input(s): LABPROT, INR in the last 72 hours. CMP     Component Value Date/Time   NA 140 2014-08-24 0423   NA 140 06/05/2013 0835   K 3.8 08/24/14 0423   CL 107 08/24/2014 0423   CO2 24 2014-08-24 0423   GLUCOSE 188* 2014-08-24 0423   GLUCOSE 132* 06/05/2013 0835   BUN 23 24-Aug-2014 0423   BUN 17 06/05/2013 0835   CREATININE 0.85 2014-08-24 0423   CALCIUM 8.6 24-Aug-2014 0423   PROT 5.7* 08/20/2014 0811   PROT 6.3 06/05/2013 0835   ALBUMIN 3.1* 08/20/2014 0811   AST 24 08/20/2014 0811   ALT 13 08/20/2014 0811   ALKPHOS 81 08/20/2014 0811   BILITOT 0.7 08/20/2014 0811   GFRNONAA 65* 2014/08/24 0423   GFRAA 75* Aug 24, 2014 0423   Lipase     Component Value Date/Time    LIPASE 24 08/19/2014 0223       Studies/Results: Dg Abd 2 Views  2014-08-24   CLINICAL DATA:  Abdomen pain for 5 days. Followup small bowel obstruction.  EXAM: ABDOMEN - 2 VIEW  COMPARISON:  August 22, 2014  FINDINGS: There is interval decrease decrease of previously noted dilated small bowel loops. Bowel content is noted identified throughout colon. Bowel markers are identified throughout abdomen pelvis. There is mild atelectasis of bilateral lung bases.  IMPRESSION: Interval decrease a previously noted dilated small bowel loops. Bowel content is identified throughout colon.   Electronically Signed   By: Abelardo Diesel M.D.   On: 08/24/2014 17:17   Dg Abd 2 Views  08/22/2014   CLINICAL DATA:  Vomiting, chest and stomach pain, recent MI, prior gastric bypass, hypertension, diabetes  EXAM: ABDOMEN - 2 VIEW  COMPARISON:  08/20/2014  FINDINGS: Retained contrast in colon.  Persistent mild dilatation of a few small bowel loops in the LEFT mid abdomen.  Remaining bowel loops decompressed.  RIGHT basilar atelectasis and question pleural effusion.  Heart appears enlarged.  Bones demineralized.  Question prior ventral hernia repair.  No definite urinary tract calcification.  IMPRESSION: Persistent mild dilatation of a few small bowel loops in the  LEFT mid abdomen.  Little interval change.   Electronically Signed   By: Lavonia Dana M.D.   On: 08/22/2014 15:30    Anti-infectives: Anti-infectives    None       Assessment/Plan SBO -Reportedly vomited yesterday so d/c was cancelled -Patients abdomen is chronically large given her chronic ventral hernia, abdomen is very soft again today -Pt tolerated clears well, will advance to fulls only, would recommend she be discharged on this for 1-2 more weeks -Constipation improved -Recommend colace BID, dulcolax daily, and miralax qd prn until normal bowel movements return -Increase water intake and mobility as able -Repeat KUB improved -Okay from our  perspective to d/c home per medical service CAD -cath 08/21/14. Medical management.    LOS: 5 days    [name] 08/24/2014, 7:46 AM Pager: [phone]  I have interviewed and examined this patient this morning. Clinical course reviewed I agree with assessment, treatment plan, and discharge plans as outlined.   Edsel Petrin. Dalbert Batman, M.D., [facility] General and Minimally invasive Surgery Breast and Colorectal Surgery Office:   [phone]

## 2014-08-24 NOTE — Progress Notes (Signed)
Tech offered bath, Pt stated she will take care of bath once she gets home.

## 2014-08-24 NOTE — Progress Notes (Signed)
PT Cancellation Note and Discharge  Patient Details Name: [name] MRN: [id number] DOB: 27-Feb-1941   Cancelled Treatment:    Reason Eval/Treat Not Completed: Patient declined, no reason specified. Pt ambulating around room independently when PT arrived. Pt states that she feels "fine" and is anticipating d/c today. Declines any further PT needs, and acute PT will sign off at this time. If needs change, please reconsult.    Rolinda Roan 08/24/2014, 9:43 AM  Rolinda Roan, PT, DPT Acute Rehabilitation Services Pager: [phone]

## 2014-08-24 NOTE — Discharge Summary (Signed)
Physician Discharge Summary  [name] KVQ:[id number] DOB: March 16, 1941 DOA: 08/19/2014  PCP: [name], MD  Admit date: 08/19/2014 Discharge date: 08/24/2014  Time spent: 25* minutes  Recommendations for Outpatient Follow-up:  1. Follow up PCP in 2 weeks 2. Follow up cardiology in 4 weeks  Discharge Diagnoses:  Principal Problem:   SBO (small bowel obstruction) Active Problems:   Depression   Lumbago   DM2 (diabetes mellitus, type 2)   HLD (hyperlipidemia)   Essential hypertension, benign   Chronic pain syndrome   Elevated troponin   Recurrent ventral incisional hernia   Obesity (BMI 30-39.9)   CAD (coronary artery disease), native coronary artery   Takotsubo syndrome   Small bowel obstruction   Discharge Condition: Stable  Diet recommendation: Low salt diet  Filed Weights   08/23/14 0007 08/23/14 0452 08/24/14 0500  Weight: 91 kg (200 lb 9.9 oz) 91 kg (200 lb 9.9 oz) 91 kg (200 lb 9.9 oz)    History of present illness:  74 y.o. female with past medical history for GERD, hyperlipidemia, gout, hypertension, hypothyroidism, asthma, diabetes, depression, diastolic congestive heart failure, history of gastric bypass surgery 10 years ago, stent placement, who presents with abdominal pain, nausea and vomiting.  Patient's abdominal pain started 5 hours ago prior to ED arrival. The pain is located in her upper abdomen and radiates to her chest. The pain is described as cramping and severe. The abdominal pain has been progressively getting worse. It is associated with nausea and multiple vomiting, without blood in vomitus. Patient suspects she has food poisoning after eating at a [employer]. She does not have diarrhea.  Patient also reports having mild chest pain. It is located in the substernal area, constant, radiating to the left shoulder and the neck. Patient does not have cough, shortness of breath. No fever or chills. Patient reports having dysuria in the past  several days, but no burning or increased urinary frequency. Patient denies fever, chills, headaches, cough, SOB, hematuria, skin rashes or leg swelling. No unilateral weakness, numbness or tingling sensations. No vision change or hearing loss.  In ED, patient was found to have high-grade small bowel obstruction by CT abdomen/pelvis. Chest x-ray showed mild vascular congestion, no pneumonia. Elevated trop at 1.08. EKG showed nonspecific T-wave flattening, QTC 486, CBC 13.3, negative urinalysis, lipase 24, temperature 98.3, no tachycardia, electrolytes okay. Patient is admitted to inpatient for further evaluation and treatment. General surgery was consulted by ED.  Hospital Course:  Small bowel obstruction - patient with history of gastric bypass surgery 10 years ago. - Etiology: Suspected adhesions from prior surgery - Treated conservatively with bowel rest, IV fluids and serial exam and imaging. Clinically improved and diet was advanced to regular consistency diet on 3/22 PM. Vomited again 1 on 3/23 at lunch. - patient moved bowels this morning. Surgery has seen and recommend to discharge home on colace and miralax prn. - patient had one episode of vomiting yesterday, and the discharge was held. Started on full liquid diet, and she will be discharged on Dulcolax suppository 10 mg bid   Chest pain/elevated troponin/ Takatsubo's syndrome - Patient has history of CAD status post PCI. - Peak troponin of 0.92. - Cardiology consultation and follow-up appreciated. - TTE 08/19/14: LVEF 35-40 percent with severe hypokinesis of the mid apical anterior and inferior myocardium. - Patient continued to complain of chest pain while at [facility]. She was transferred to [facility] stepdown unit and was started on NTG and heparin  infusion. - Left heart cath 3/22 showed mild nonobstructive CAD, mild segmental LV systolic dysfunction with periapical wall motion abnormality and hyperdynamic motion at the base of  the heart and normal LVEDP. Suspect Takotsubo's cardiomyopathy and medical therapy recommended. - Cardiology suspects that her severe chest pain was due to esophageal spasm precipitated by swallowing a pill. This apparently has been a recurrent problem and she has had at least 4 catheterizations demonstrating minimal coronary artery disease. - From a cardiac standpoint, she has been cleared to go home. She should continue calcium channel blockers to prevent esophageal spasm. Will start low dose amlodipine 2.5 mg po daily. Follow up cardiology in 4 weeks x 3 days.  Uncontrolled type I DM - Hemoglobin A1c on 05/31/14:8.8. - Oral hypoglycemics held in hospital. - Patient was placed on low dose Lantus and NovoLog SSI. - Continues to have significantly high CBGs in the 300's: Likely precipitated by inadequate insulins and Solu-Medrol that she received pre cath - She usually takes 45 units of daily insulin at home. We increased Lantus from 5 units daily to 25 units daily beginning now (received 5 units this morning) and continue NovoLog SSI with HS scale.  - Will continue the NPH 25 units am and 20 units pm  Essential hypertension - Reasonably controlled. -  Hyperlipidemia - Continue statins.  Anemia - Chronic and stable.  Hypothyroidism - TSH 06/05/13:2.2 - Not on supplements at home. - Recheck TSH was 4.401  Chronic diastolic CHF - Clinically compensated.  Prolonged Qtc Ekg showed mild prolonged Qtc 497, will change the dose of Celexa to 20 mg po daily.    Procedures Left heart catheterization  Consultations:  Surgery  cardiology  Discharge Exam: Filed Vitals:   08/24/14 0748  BP: 144/91  Pulse: 91  Temp: 97.6 F (36.4 C)  Resp: 16    General: Appear in no acute distress Cardiovascular: S1s2 RRR Respiratory: Clear bilaterally  Discharge Instructions   Discharge Instructions    Diet - low sodium heart healthy    Complete by:  As directed      Increase  activity slowly    Complete by:  As directed           Current Discharge Medication List    START taking these medications   Details  amLODipine (NORVASC) 2.5 MG tablet Take 1 tablet (2.5 mg total) by mouth daily. Qty: 30 tablet, Refills: 2    atorvastatin (LIPITOR) 40 MG tablet Take 1 tablet (40 mg total) by mouth daily at 6 PM. Qty: 30 tablet, Refills: 2    bisacodyl (DULCOLAX) 10 MG suppository Place 1 suppository (10 mg total) rectally 2 (two) times daily. Qty: 6 suppository, Refills: 0    docusate sodium (COLACE) 100 MG capsule Take 1 capsule (100 mg total) by mouth 2 (two) times daily. Qty: 30 capsule, Refills: 0    polyethylene glycol (MIRALAX) packet Take 17 g by mouth daily as needed for mild constipation. Qty: 14 each, Refills: 0      CONTINUE these medications which have CHANGED   Details  citalopram (CELEXA) 40 MG tablet Take 0.5 tablets (20 mg total) by mouth daily. Qty: 90 tablet, Refills: 3      CONTINUE these medications which have NOT CHANGED   Details  amoxicillin (AMOXIL) 500 MG capsule 2 capsules daily. Prior to dental work Refills: 0    aspirin 81 MG tablet Take 81 mg by mouth daily.     cetirizine (ZYRTEC) 10 MG tablet Take 10  mg by mouth daily as needed for allergies. For allergies    Cholecalciferol (VITAMIN D3) 5000 UNITS CAPS Take 1 tablet by mouth daily. For supplement    furosemide (LASIX) 40 MG tablet One each morning to treat edema Qty: 30 tablet, Refills: 3   Associated Diagnoses: Edema    glipiZIDE (GLUCOTROL XL) 10 MG 24 hr tablet Take 1 tablet (10 mg total) by mouth 2 (two) times daily. Take in the morning and evening to control blood sugar Qty: 180 tablet, Refills: 4   Associated Diagnoses: Type II or unspecified type diabetes mellitus without mention of complication, not stated as uncontrolled    insulin NPH (HUMULIN N,NOVOLIN N) 100 UNIT/ML injection 25 units in the morning and 20 units in the evening to control diabetes.     Magnesium Oxide 250 MG TABS Take 250 mg by mouth daily.     nitroGLYCERIN (NITROSTAT) 0.4 MG SL tablet Place 1 tablet (0.4 mg total) under the tongue every 5 (five) minutes as needed. For chest pain. Maximum of 3 tablets in 15 minutes. Qty: 25 tablet, Refills: 4   Associated Diagnoses: Angina decubitus    PRILOSEC 40 MG capsule TAKE 1 CAPSULE TWICE DAILY FOR ACID REFLUX Qty: 180 capsule, Refills: 0    valsartan-hydrochlorothiazide (DIOVAN HCT) 80-12.5 MG per tablet Take 1 tablet by mouth daily. Qty: 90 tablet, Refills: 3    chlorpheniramine-HYDROcodone (TUSSIONEX) 10-8 MG/5ML LQCR Take 5 mLs by mouth every 12 (twelve) hours. Take scheduled for 1 week and then as needed for cough. Qty: 473 mL, Refills: 0       Allergies  Allergen Reactions  . Ciprofloxacin Rash  . Advair Diskus [Fluticasone-Salmeterol] Other (See Comments)    Throat closes  . Albuterol     REACTION: closes throat  . Alendronate Sodium Itching  . Benadryl [Diphenhydramine Hcl]   . Benzonatate     REACTION: rash/hives  . Cephalexin Nausea Only  . Gabapentin Other (See Comments)    Loss of memory  . Iohexol      Desc: rash and DIF BREATHING   . Keflex [Cephalexin]   . Morphine Nausea And Vomiting  . Other     Decongestants  . Propoxyphene Hcl   . Reclast [Zoledronic Acid] Other (See Comments)    Chest pain  . Avapro [Irbesartan] Rash  . Codeine Nausea And Vomiting, Swelling and Rash  . Tessalon Perles Rash  . Tramadol Nausea Only and Rash   Follow-up Information    Follow up with GREEN, Viviann Spare, MD In 2 weeks.   Specialty:  Internal Medicine   Contact information:   [place] [address] [phone]       Follow up with TILLEY JR,W SPENCER, MD. Schedule an appointment as soon as possible for a visit in 4 weeks.   Specialty:  Cardiology   Contact information:   [address] [place] [address] [phone]        The results of significant  diagnostics from this hospitalization (including imaging, microbiology, ancillary and laboratory) are listed below for reference.    Significant Diagnostic Studies: Dg Chest 2 View  08/19/2014   CLINICAL DATA:  Chest pain, nausea and vomiting  EXAM: CHEST  2 VIEW  COMPARISON:  06/11/2014  FINDINGS: There is mild vascular prominence without interstitial or alveolar edema. There are no airspace opacities. There are no effusions. There is chronic compression of a lower thoracic vertebral body, unchanged.  IMPRESSION: Mild vascular congestion without  interstitial or alveolar edema.   Electronically Signed   By: Andreas Newport M.D.   On: 08/19/2014 02:51   Ct Abdomen Pelvis W Contrast  08/19/2014   CLINICAL DATA:  Chest pain, nausea, vomiting, diarrhea for 5 hours.  EXAM: CT ABDOMEN AND PELVIS WITH CONTRAST  TECHNIQUE: Multidetector CT imaging of the abdomen and pelvis was performed using the standard protocol following bolus administration of intravenous contrast.  CONTRAST:  62mL OMNIPAQUE IOHEXOL 300 MG/ML SOLN, 166mL OMNIPAQUE IOHEXOL 300 MG/ML SOLN  COMPARISON:  01/24/2009  FINDINGS: There is a high-grade obstruction of the mid jejunum, with marked dilatation of bowel proximal to the transition point. The transition point is in the mid abdomen just to the left of midline, probably in close proximity to the roux-en-Y anastomosis. There is a large ventral hernia in the same area, but the hernia does not appear to be related to the obstruction. The distal jejunum and the entire ileum are decompressed. The colon is decompressed. There is no extraluminal air. There are unremarkable appearances of the gastrojejunostomy.  There are normal appearances of the liver with the exception of a tiny benign hypodensity which is unchanged from 2010. There are normal appearances of the spleen, pancreas, adrenals and kidneys with the exception of a few benign appearing renal cysts. The abdominal aorta is normal in caliber  with mild atherosclerotic calcification. Gallbladder and bile ducts are remarkable only for a few small calculi in the gallbladder lumen. There is no bile duct dilatation.  There is hysterectomy.  No significant abnormality is evident in the lower chest.  IMPRESSION: *High-grade small bowel obstruction in the mid jejunum, most likely due to adhesion. *Large ventral hernia which is not the etiology of the bowel obstruction. *Unremarkable gastric jejunostomy *Cholelithiasis   Electronically Signed   By: Andreas Newport M.D.   On: 08/19/2014 04:48   Dg Abd 2 Views  08/23/2014   CLINICAL DATA:  Abdomen pain for 5 days. Followup small bowel obstruction.  EXAM: ABDOMEN - 2 VIEW  COMPARISON:  August 22, 2014  FINDINGS: There is interval decrease decrease of previously noted dilated small bowel loops. Bowel content is noted identified throughout colon. Bowel markers are identified throughout abdomen pelvis. There is mild atelectasis of bilateral lung bases.  IMPRESSION: Interval decrease a previously noted dilated small bowel loops. Bowel content is identified throughout colon.   Electronically Signed   By: Abelardo Diesel M.D.   On: 08/23/2014 17:17   Dg Abd 2 Views  08/22/2014   CLINICAL DATA:  Vomiting, chest and stomach pain, recent MI, prior gastric bypass, hypertension, diabetes  EXAM: ABDOMEN - 2 VIEW  COMPARISON:  08/20/2014  FINDINGS: Retained contrast in colon.  Persistent mild dilatation of a few small bowel loops in the LEFT mid abdomen.  Remaining bowel loops decompressed.  RIGHT basilar atelectasis and question pleural effusion.  Heart appears enlarged.  Bones demineralized.  Question prior ventral hernia repair.  No definite urinary tract calcification.  IMPRESSION: Persistent mild dilatation of a few small bowel loops in the LEFT mid abdomen.  Little interval change.   Electronically Signed   By: Lavonia Dana M.D.   On: 08/22/2014 15:30   Dg Abd 2 Views  08/20/2014   CLINICAL DATA:  Follow up partial  small bowel obstruction.  EXAM: ABDOMEN - 2 VIEW  COMPARISON:  08/19/2014  FINDINGS: All of the oral contrast has progressed through to the colon. There is moderate dilatation of mid abdominal small bowel loops without significant  interval change. No free air.  IMPRESSION: Persistent dilatation of mid abdominal small bowel loops. All of the oral contrast is now in the colon.   Electronically Signed   By: Andreas Newport M.D.   On: 08/20/2014 06:24   Dg Abd 2 Views  08/19/2014   CLINICAL DATA:  PT C/O CONTINUED ABD DISCOMFORT, WITH SOME RELIEF, NO BM X 1 DAY. PT REPORTS SHE CAME TO ED YESTERDAY FOR N/V, ABD CRAMPING, DIAGNOSED/ADMITTED FOR SBO  EXAM: ABDOMEN - 2 VIEW  COMPARISON:  CT, 07/2014 at 4:02 a.m.  FINDINGS: Dilated loops of central small bowel are noted in the left mid abdomen. Bowel dilation may be minimally improved. It has not worsened. Contrast is seen more distal small bowel, mostly in the pelvis.  No free air.  Hernia mesh overlies much of the right abdomen. Soft tissues otherwise unremarkable.  IMPRESSION: 1. There still prominent loops of small bowel in the left mid abdomen. The degree of dilation may be minimally improved. Contrast is also extended from the more proximal small bowel into the distal small bowel. Findings remain consistent with a partial small bowel obstruction. 2. No free air.   Electronically Signed   By: Lajean Manes M.D.   On: 08/19/2014 09:44    Microbiology: Recent Results (from the past 240 hour(s))  Urine culture     Status: None   Collection Time: 08/19/14  2:04 AM  Result Value Ref Range Status   Specimen Description URINE, RANDOM  Final   Special Requests NONE  Final   Colony Count   Final    40,000 COLONIES/ML Performed at [facility]    Culture   Final    Multiple bacterial morphotypes present, none predominant. Suggest appropriate recollection if clinically indicated. Performed at [facility]    Report Status 08/20/2014 FINAL   Final  MRSA PCR Screening     Status: None   Collection Time: 08/20/14  8:45 PM  Result Value Ref Range Status   MRSA by PCR NEGATIVE NEGATIVE Final    Comment:        The GeneXpert MRSA Assay (FDA approved for NASAL specimens only), is one component of a comprehensive MRSA colonization surveillance program. It is not intended to diagnose MRSA infection nor to guide or monitor treatment for MRSA infections.      Labs: Basic Metabolic Panel:  Recent Labs Lab 08/19/14 0223 08/20/14 0811 08/21/14 1901 08/22/14 0308 08/23/14 0423  NA 131* 138  --  135 140  K 3.6 3.9  --  4.4 3.8  CL 95* 106  --  104 107  CO2 26 23  --  21 24  GLUCOSE 392* 136*  --  398* 188*  BUN 14 16  --  15 23  CREATININE 0.78 0.74 1.14* 0.84 0.85  CALCIUM 8.7 8.0*  --  8.1* 8.6   Liver Function Tests:  Recent Labs Lab 08/19/14 0223 08/20/14 0811  AST 21 24  ALT 14 13  ALKPHOS 113 81  BILITOT 0.9 0.7  PROT 7.2 5.7*  ALBUMIN 3.8 3.1*    Recent Labs Lab 08/19/14 0223  LIPASE 24   No results for input(s): AMMONIA in the last 168 hours. CBC:  Recent Labs Lab 08/19/14 0223 08/20/14 0230 08/21/14 1901 08/23/14 0423  WBC 13.3* 11.6* 8.4 7.4  NEUTROABS 11.3*  --   --   --   HGB 13.0 10.9* 11.1* 10.2*  HCT 41.6 34.8* 36.2 32.9*  MCV 84.2 85.3 86.2 86.4  PLT 357 PLATELET CLUMPS NOTED ON SMEAR, COUNT APPEARS ADEQUATE 282 278   Cardiac Enzymes:  Recent Labs Lab 08/22/14 2329 08/23/14 0250 08/23/14 0830 08/23/14 1440 08/23/14 2056  TROPONINI 0.13* 0.08* 0.07* 0.11* 0.08*   BNP: BNP (last 3 results)  Recent Labs  05/28/14 0246 08/19/14 0744  BNP 19.4 363.4*    ProBNP (last 3 results) No results for input(s): PROBNP in the last 8760 hours.  CBG:  Recent Labs Lab 08/23/14 0638 08/23/14 1215 08/23/14 1732 08/23/14 2136 08/24/14 0639  GLUCAP 177* 216* 162* 154* 144*       Signed:  Sinthia Karabin S  [facility] 08/24/2014, 8:44 AM

## 2014-08-31 ENCOUNTER — Other Ambulatory Visit: Payer: Self-pay | Admitting: Internal Medicine

## 2014-09-11 ENCOUNTER — Other Ambulatory Visit: Payer: Self-pay | Admitting: *Deleted

## 2014-09-11 MED ORDER — INSULIN NPH (HUMAN) (ISOPHANE) 100 UNIT/ML ~~LOC~~ SUSP: SUBCUTANEOUS | Status: DC

## 2014-09-11 NOTE — Telephone Encounter (Signed)
[employer].

## 2014-09-14 ENCOUNTER — Other Ambulatory Visit: Payer: Self-pay | Admitting: Internal Medicine

## 2014-09-14 NOTE — Telephone Encounter (Signed)
Patient called her mail order for Humulin N hasn't come in yet and she's out, could we fax one bottle to [facility] in [place]. Done

## 2014-10-05 ENCOUNTER — Telehealth: Payer: Self-pay

## 2014-10-05 DIAGNOSIS — E785 Hyperlipidemia, unspecified: Secondary | ICD-10-CM

## 2014-10-05 DIAGNOSIS — E119 Type 2 diabetes mellitus without complications: Secondary | ICD-10-CM

## 2014-10-05 DIAGNOSIS — E039 Hypothyroidism, unspecified: Secondary | ICD-10-CM

## 2014-10-05 DIAGNOSIS — I10 Essential (primary) hypertension: Secondary | ICD-10-CM

## 2014-10-05 MED ORDER — DOCUSATE SODIUM 100 MG PO CAPS: 100.0000 mg | ORAL_CAPSULE | Freq: Two times a day (BID) | ORAL | Status: DC

## 2014-10-05 MED ORDER — AMLODIPINE BESYLATE 2.5 MG PO TABS: 2.5000 mg | ORAL_TABLET | Freq: Every day | ORAL | Status: DC

## 2014-10-05 NOTE — Telephone Encounter (Signed)
Patient called wants 90 day supply of Colace and Norvasc to [facility] in [place]. Also wants lab work done before her appt with Dr. Nyoka Cowden, she always has labs, for her blood sugar.  Made appt to come in for labs 6/30 same day her husband has appt for labs

## 2014-10-13 ENCOUNTER — Other Ambulatory Visit: Payer: Self-pay | Admitting: Internal Medicine

## 2014-10-19 ENCOUNTER — Encounter (HOSPITAL_COMMUNITY): Payer: Self-pay | Admitting: *Deleted

## 2014-10-19 ENCOUNTER — Emergency Department (HOSPITAL_COMMUNITY)
Admission: EM | Admit: 2014-10-19 | Discharge: 2014-10-20 | Disposition: A | Discharge status: Home / Self Care | Payer: Medicare Other | Attending: Emergency Medicine | Admitting: Emergency Medicine

## 2014-10-19 ENCOUNTER — Emergency Department (HOSPITAL_COMMUNITY): Payer: Medicare Other

## 2014-10-19 DIAGNOSIS — Z87448 Personal history of other diseases of urinary system: Secondary | ICD-10-CM | POA: Insufficient documentation

## 2014-10-19 DIAGNOSIS — E669 Obesity, unspecified: Secondary | ICD-10-CM | POA: Insufficient documentation

## 2014-10-19 DIAGNOSIS — R9431 Abnormal electrocardiogram [ECG] [EKG]: Secondary | ICD-10-CM | POA: Diagnosis not present

## 2014-10-19 DIAGNOSIS — E559 Vitamin D deficiency, unspecified: Secondary | ICD-10-CM | POA: Diagnosis not present

## 2014-10-19 DIAGNOSIS — I10 Essential (primary) hypertension: Secondary | ICD-10-CM | POA: Diagnosis not present

## 2014-10-19 DIAGNOSIS — Z8719 Personal history of other diseases of the digestive system: Secondary | ICD-10-CM | POA: Diagnosis not present

## 2014-10-19 DIAGNOSIS — E780 Pure hypercholesterolemia: Secondary | ICD-10-CM | POA: Diagnosis not present

## 2014-10-19 DIAGNOSIS — E785 Hyperlipidemia, unspecified: Secondary | ICD-10-CM | POA: Diagnosis not present

## 2014-10-19 DIAGNOSIS — Z8742 Personal history of other diseases of the female genital tract: Secondary | ICD-10-CM | POA: Insufficient documentation

## 2014-10-19 DIAGNOSIS — J441 Chronic obstructive pulmonary disease with (acute) exacerbation: Secondary | ICD-10-CM | POA: Insufficient documentation

## 2014-10-19 DIAGNOSIS — Z8739 Personal history of other diseases of the musculoskeletal system and connective tissue: Secondary | ICD-10-CM | POA: Insufficient documentation

## 2014-10-19 DIAGNOSIS — Z872 Personal history of diseases of the skin and subcutaneous tissue: Secondary | ICD-10-CM | POA: Insufficient documentation

## 2014-10-19 DIAGNOSIS — F329 Major depressive disorder, single episode, unspecified: Secondary | ICD-10-CM | POA: Insufficient documentation

## 2014-10-19 DIAGNOSIS — Z7982 Long term (current) use of aspirin: Secondary | ICD-10-CM | POA: Diagnosis not present

## 2014-10-19 DIAGNOSIS — Z86018 Personal history of other benign neoplasm: Secondary | ICD-10-CM | POA: Diagnosis not present

## 2014-10-19 DIAGNOSIS — H532 Diplopia: Secondary | ICD-10-CM | POA: Diagnosis not present

## 2014-10-19 DIAGNOSIS — R6889 Other general symptoms and signs: Secondary | ICD-10-CM | POA: Diagnosis not present

## 2014-10-19 DIAGNOSIS — Z79899 Other long term (current) drug therapy: Secondary | ICD-10-CM | POA: Diagnosis not present

## 2014-10-19 DIAGNOSIS — E1065 Type 1 diabetes mellitus with hyperglycemia: Secondary | ICD-10-CM | POA: Insufficient documentation

## 2014-10-19 LAB — COMPREHENSIVE METABOLIC PANEL
ALT: 17 U/L (ref 14–54)
ANION GAP: 10 (ref 5–15)
AST: 17 U/L (ref 15–41)
Albumin: 3.8 g/dL (ref 3.5–5.0)
Alkaline Phosphatase: 92 U/L (ref 38–126)
BUN: 18 mg/dL (ref 6–20)
CALCIUM: 9.1 mg/dL (ref 8.9–10.3)
CO2: 25 mmol/L (ref 22–32)
CREATININE: 0.8 mg/dL (ref 0.44–1.00)
Chloride: 103 mmol/L (ref 101–111)
Glucose, Bld: 187 mg/dL — ABNORMAL HIGH (ref 65–99)
Potassium: 4.1 mmol/L (ref 3.5–5.1)
Sodium: 138 mmol/L (ref 135–145)
Total Bilirubin: 0.7 mg/dL (ref 0.3–1.2)
Total Protein: 7 g/dL (ref 6.5–8.1)

## 2014-10-19 LAB — DIFFERENTIAL
Basophils Absolute: 0.1 10*3/uL (ref 0.0–0.1)
Basophils Relative: 1 % (ref 0–1)
Eosinophils Absolute: 0.1 10*3/uL (ref 0.0–0.7)
Eosinophils Relative: 1 % (ref 0–5)
Lymphocytes Relative: 23 % (ref 12–46)
Lymphs Abs: 2 10*3/uL (ref 0.7–4.0)
Monocytes Absolute: 0.4 10*3/uL (ref 0.1–1.0)
Monocytes Relative: 5 % (ref 3–12)
NEUTROS ABS: 6.4 10*3/uL (ref 1.7–7.7)
Neutrophils Relative %: 70 % (ref 43–77)

## 2014-10-19 LAB — URINALYSIS, ROUTINE W REFLEX MICROSCOPIC
Bilirubin Urine: NEGATIVE
GLUCOSE, UA: NEGATIVE mg/dL
Hgb urine dipstick: NEGATIVE
KETONES UR: NEGATIVE mg/dL
LEUKOCYTES UA: NEGATIVE
Nitrite: NEGATIVE
PH: 7 (ref 5.0–8.0)
Protein, ur: NEGATIVE mg/dL
SPECIFIC GRAVITY, URINE: 1.005 (ref 1.005–1.030)
UROBILINOGEN UA: 0.2 mg/dL (ref 0.0–1.0)

## 2014-10-19 LAB — CBC
HEMATOCRIT: 38.6 % (ref 36.0–46.0)
Hemoglobin: 11.9 g/dL — ABNORMAL LOW (ref 12.0–15.0)
MCH: 25.9 pg — AB (ref 26.0–34.0)
MCHC: 30.8 g/dL (ref 30.0–36.0)
MCV: 84.1 fL (ref 78.0–100.0)
PLATELETS: 285 10*3/uL (ref 150–400)
RBC: 4.59 MIL/uL (ref 3.87–5.11)
RDW: 15.5 % (ref 11.5–15.5)
WBC: 9 10*3/uL (ref 4.0–10.5)

## 2014-10-19 LAB — RAPID URINE DRUG SCREEN, HOSP PERFORMED
Amphetamines: NOT DETECTED
Barbiturates: NOT DETECTED
Benzodiazepines: NOT DETECTED
COCAINE: NOT DETECTED
OPIATES: NOT DETECTED
TETRAHYDROCANNABINOL: NOT DETECTED

## 2014-10-19 LAB — I-STAT TROPONIN, ED: TROPONIN I, POC: 0 ng/mL (ref 0.00–0.08)

## 2014-10-19 LAB — CBG MONITORING, ED: GLUCOSE-CAPILLARY: 159 mg/dL — AB (ref 65–99)

## 2014-10-19 NOTE — ED Notes (Signed)
Patient transported to MRI 

## 2014-10-19 NOTE — ED Notes (Signed)
Delay in recollect lab draw, pt in MRI

## 2014-10-19 NOTE — ED Notes (Signed)
Patient is scheduled for MRI, she denied having stens place, daughter at bedside as well, both report pt having cardiac cath but no blockages so no stent placed

## 2014-10-19 NOTE — ED Notes (Signed)
Pt ambulatory to the BR with steady gait

## 2014-10-19 NOTE — ED Provider Notes (Signed)
Medical screening examination/treatment/procedure(s) were conducted as a shared visit with non-physician practitioner(s) and myself.  I personally evaluated the patient during the encounter.   EKG Interpretation   Date/Time:  Friday Oct 19 2014 20:25:41 EDT Ventricular Rate:  87 PR Interval:  156 QRS Duration: 69 QT Interval:  371 QTC Calculation: 446 R Axis:   10 Text Interpretation:  Sinus rhythm Abnormal T, consider ischemia, lateral  leads improved from prior Confirmed by Ramona Ruark  MD, Iris Hairston ([id number]) on  10/19/2014 10:39:26 PM     Issue here after developing sudden onset of diplopia after vomiting. Patient's MRI was negative for signs of brainstem infarct. Neurological exam here is normal. Stable for discharge  Lacretia Leigh, MD 10/19/14 2330

## 2014-10-19 NOTE — ED Provider Notes (Signed)
CSN: [id number]     Arrival date & time 10/19/14  1938 History   First MD Initiated Contact with Patient 10/19/14 1953     Chief Complaint  Patient presents with  . Diplopia     (Consider location/radiation/quality/duration/timing/severity/associated sxs/prior Treatment) HPI Comments: Patient with h/o diabetes, HTN, high cholesterol, no stroke history -- presents with c/o acute onset of vertical binocular diplopia with slight left eyelid ptosis starting at 4:30 PM. Patient states that she vomited after eating carrots with dip prior to this. She is seeing images that are above one another. No blurry vision. She states that she sees one image when she covers up either eye. She sees one image with extreme right sided gaze and upward gaze. She has not had any headache or head injuries. Patient denies other signs of stroke including: facial droop, slurred speech, aphasia, weakness/numbness in extremities, imbalance/trouble walking. No numbness or tingling in her extremities. Patient has not had symptoms like this before. She is on baby aspirin daily. The onset of this condition was acute. The course is constant. Aggravating factors: none. Alleviating factors: none.     The history is provided by the patient and a relative.    Past Medical History  Diagnosis Date  . Hypertension   . Diabetes mellitus   . High cholesterol   . Other specified disease of sebaceous glands   . Other B-complex deficiencies   . Depressive disorder, not elsewhere classified   . Tension headache   . Migraine without aura, with intractable migraine, so stated, with status migrainosus   . Type I (juvenile type) diabetes mellitus without mention of complication, uncontrolled   . Unspecified vitamin D deficiency   . Dyskinesia of esophagus   . Memory loss   . Mild cognitive impairment, so stated   . Other nonspecific abnormal serum enzyme levels   . Lipoma of other skin and subcutaneous tissue   . Pain in joint,  shoulder region   . Nonspecific (abnormal) findings on radiological and other examination of abdominal area, including retroperitoneum   . Nonspecific abnormal results of liver function study   . Pain in joint, pelvic region and thigh   . Pain in joint, ankle and foot   . Edema   . Lumbago   . Other symptoms involving cardiovascular system   . Abdominal pain, unspecified site   . Other specified cardiac dysrhythmias(427.89)   . Ventral hernia, unspecified, without mention of obstruction or gangrene   . Extrinsic asthma, unspecified   . Dizziness and giddiness   . Palpitations   . Shortness of breath   . Reflux esophagitis   . Obesity, unspecified   . Unspecified hypothyroidism   . Type I (juvenile type) diabetes mellitus without mention of complication, not stated as uncontrolled   . Other and unspecified hyperlipidemia   . Gout, unspecified   . Unspecified essential hypertension   . Abnormality of gait   . Chest pain, unspecified   . Obstructive chronic bronchitis with exacerbation   . Female stress incontinence   . Apnea   . Cardiomegaly   . Complication of anesthesia     hard time waking up   Past Surgical History  Procedure Laterality Date  . Stents      last time she said was 1 yr ago   . Breast surgery    . Appendectomy    . Abdominal hysterectomy    . Laparoscopic gastric bypass  07/22/2004    PROCEDURE: Laparoscopic Roux-en-Y gastric  bypass, antecolic, antegastric,  . Exploratory laparotomy w/ bowel resection  07/25/2004    PROCEDURE: Laparoscopy, open laparotomy, resection of jejunojejunostomy  . Incisional hernia repair  03/19/2006    PROCEDURE: Open ventral hernia repair with mesh.  . Laparoscopic incisional / umbilical / ventral hernia repair  09/22/2005    PROCEDURE: Laparoscopic ventral hernia repair with mesh.  . Left heart catheterization with coronary angiogram N/A 08/21/2014    Procedure: LEFT HEART CATHETERIZATION WITH CORONARY ANGIOGRAM;  Surgeon:  Sherren Mocha, MD;  Location: [facility] CATH LAB;  Service: Cardiovascular;  Laterality: N/A;   Family History  Problem Relation Age of Onset  . Stroke Mother   . Stroke Father   . Diabetes Father   . Heart disease Father   . Diabetes Son   . Cancer Brother     BLADDER  . Diabetes Brother    History  Substance Use Topics  . Smoking status: Never Smoker   . Smokeless tobacco: Never Used  . Alcohol Use: No   OB History    No data available     Review of Systems  Constitutional: Negative for fever.  HENT: Negative for congestion, dental problem, rhinorrhea and sinus pressure.   Eyes: Positive for visual disturbance. Negative for photophobia, discharge and redness.  Respiratory: Negative for shortness of breath.   Cardiovascular: Negative for chest pain.  Gastrointestinal: Negative for nausea and vomiting.  Musculoskeletal: Negative for gait problem, neck pain and neck stiffness.  Skin: Negative for rash.  Neurological: Positive for facial asymmetry (mild L eye ptosis). Negative for syncope, speech difficulty, weakness, light-headedness, numbness and headaches.  Psychiatric/Behavioral: Negative for confusion.      Allergies  Ciprofloxacin; Advair diskus; Albuterol; Alendronate sodium; Benadryl; Benzonatate; Cephalexin; Gabapentin; Iohexol; Keflex; Morphine; Other; Propoxyphene hcl; Reclast; Avapro; Codeine; Tessalon perles; and Tramadol  Home Medications   Prior to Admission medications   Medication Sig Start Date End Date Taking? Authorizing Provider  amLODipine (NORVASC) 2.5 MG tablet Take 1 tablet (2.5 mg total) by mouth daily. 10/05/14  Yes [name], MD  aspirin 81 MG tablet Take 81 mg by mouth daily.    Yes Historical Provider, MD  atorvastatin (LIPITOR) 40 MG tablet Take 1 tablet (40 mg total) by mouth daily at 6 PM. 08/23/14  Yes Oswald Hillock, MD  Cholecalciferol (VITAMIN D3) 5000 UNITS CAPS Take 1 tablet by mouth daily. For supplement   Yes Historical Provider, MD   citalopram (CELEXA) 40 MG tablet 1 by mouth daily for depression Patient taking differently: Take 40 mg by mouth daily. 1 by mouth daily for depression 08/31/14  Yes [name], MD  docusate sodium (COLACE) 100 MG capsule Take 1 capsule (100 mg total) by mouth 2 (two) times daily. 10/05/14  Yes [name], MD  glipiZIDE (GLUCOTROL XL) 10 MG 24 hr tablet Take 1 tablet (10 mg total) by mouth 2 (two) times daily. Take in the morning and evening to control blood sugar 12/06/12  Yes [name], MD  HUMULIN N 100 UNIT/ML injection INJECT 25 UNITS SUBCUTANEOUSLY TWICE A DAY Patient taking differently: INJECT 30 UNITS SUBCUTANEOUSLY TWICE A DAY 09/14/14  Yes [name], MD  losartan-hydrochlorothiazide (HYZAAR) 50-12.5 MG per tablet TAKE 1 TABLET BY MOUTH EVERY DAY TO CONTROL BLOOD PRESSURE 10/15/14  Yes [name], MD  Magnesium Oxide 250 MG TABS Take 250 mg by mouth daily.    Yes Historical Provider, MD  PRILOSEC 40 MG capsule TAKE 1 CAPSULE TWICE DAILY  FOR ACID REFLUX 05/09/14  Yes [name], MD  cetirizine (ZYRTEC) 10 MG tablet Take 10 mg by mouth daily as needed for allergies. For allergies    Historical Provider, MD  chlorpheniramine-HYDROcodone (TUSSIONEX) 10-8 MG/5ML LQCR Take 5 mLs by mouth every 12 (twelve) hours. Take scheduled for 1 week and then as needed for cough. Patient not taking: Reported on 08/19/2014 06/03/14   Bonnielee Haff, MD  citalopram (CELEXA) 40 MG tablet Take 0.5 tablets (20 mg total) by mouth daily. Patient not taking: Reported on 10/19/2014 08/23/14   Oswald Hillock, MD  furosemide (LASIX) 40 MG tablet One each morning to treat edema Patient taking differently: Take 40 mg by mouth daily as needed for fluid. One each morning to treat edema 06/07/13   [name], MD  nitroGLYCERIN (NITROSTAT) 0.4 MG SL tablet Place 1 tablet (0.4 mg total) under the tongue every 5 (five) minutes as needed. For chest pain. Maximum of 3 tablets in 15 minutes. 12/06/12   [name],  MD  polyethylene glycol [facility]) packet Take 17 g by mouth daily as needed for mild constipation. 08/23/14   Oswald Hillock, MD  valsartan-hydrochlorothiazide (DIOVAN HCT) 80-12.5 MG per tablet Take 1 tablet by mouth daily. Patient not taking: Reported on 10/19/2014 07/17/14   Tanda Rockers, MD   BP 150/55 mmHg  Pulse 71  Temp(Src) 98.2 F (36.8 C) (Oral)  Resp 16  SpO2 94%   Physical Exam  Constitutional: She is oriented to person, place, and time. She appears well-developed and well-nourished.  HENT:  Head: Normocephalic and atraumatic.  Right Ear: Tympanic membrane, external ear and ear canal normal.  Left Ear: Tympanic membrane, external ear and ear canal normal.  Nose: Nose normal.  Mouth/Throat: Uvula is midline, oropharynx is clear and moist and mucous membranes are normal.  Eyes: Conjunctivae, EOM and lids are normal. Pupils are equal, round, and reactive to light. Right eye exhibits no nystagmus. Left eye exhibits no nystagmus.  Neck: Normal range of motion. Neck supple.  Cardiovascular: Normal rate and regular rhythm.   Pulmonary/Chest: Effort normal and breath sounds normal.  Abdominal: Soft. There is no tenderness.  Musculoskeletal:       Cervical back: She exhibits normal range of motion, no tenderness and no bony tenderness.  Neurological: She is alert and oriented to person, place, and time. She has normal strength and normal reflexes. A cranial nerve deficit is present. No sensory deficit. She displays a negative Romberg sign. Coordination and gait normal. GCS eye subscore is 4. GCS verbal subscore is 5. GCS motor subscore is 6.  Slight drooping of L upper eyelid. No other facial droop. Forehead spared. Neg cover/uncover. EOMI appear intact to me with no severe deficits.   Skin: Skin is warm and dry.  Psychiatric: She has a normal mood and affect.  Nursing note and vitals reviewed.   ED Course  Procedures (including critical care time) Labs Review Labs Reviewed  CBC  - Abnormal; Notable for the following:    Hemoglobin 11.9 (*)    MCH 25.9 (*)    All other components within normal limits  COMPREHENSIVE METABOLIC PANEL - Abnormal; Notable for the following:    Glucose, Bld 187 (*)    All other components within normal limits  CBG MONITORING, ED - Abnormal; Notable for the following:    Glucose-Capillary 159 (*)    All other components within normal limits  DIFFERENTIAL  URINE RAPID DRUG SCREEN (HOSP PERFORMED)  URINALYSIS, ROUTINE W REFLEX MICROSCOPIC  Randolm Idol, ED    Imaging Review Mr Brain Wo Contrast  10/19/2014   CLINICAL DATA:  Diplopia beginning this morning after vomiting. History of hypertension, diabetes, migraine, memory loss.  EXAM: MRI HEAD WITHOUT CONTRAST  TECHNIQUE: Multiplanar, multiecho pulse sequences of the brain and surrounding structures were obtained without intravenous contrast.  COMPARISON:  CT sinuses September 27, 2008  FINDINGS: The ventricles and sulci are normal for patient's age. No abnormal parenchymal signal, mass lesions, mass effect. No reduced diffusion to suggest acute ischemia. No susceptibility artifact to suggest hemorrhage. Patchy pontine FLAIR T2 hyperintense signal, a few scattered subcentimeter supratentorial white matter T2 hyperintensities.  No abnormal extra-axial fluid collections. No extra-axial masses though, contrast enhanced sequences would be more sensitive. Normal major intracranial vascular flow voids seen at the skull base.  Ocular globes and orbital contents are normal though not tailored for evaluation, bilateral ocular lens implants. No abnormal sellar expansion. Mild ethmoid mucosal thickening, low signal effacing the RIGHT sphenoid sinus corresponding to chronic sinusitis on prior imaging. No suspicious calvarial bone marrow signal. No abnormal sellar expansion. Craniocervical junction maintained.  IMPRESSION: Normal noncontrast MRI of the brain for age, no acute intracranial process.  Chronic  paranasal sinusitis.   Electronically Signed   By: Elon Alas   On: 10/19/2014 22:48     EKG Interpretation   Date/Time:  Friday Oct 19 2014 20:25:41 EDT Ventricular Rate:  87 PR Interval:  156 QRS Duration: 69 QT Interval:  371 QTC Calculation: 446 R Axis:   10 Text Interpretation:  Sinus rhythm Abnormal T, consider ischemia, lateral  leads improved from prior Confirmed by ALLEN  MD, ANTHONY ([id number]) on  10/19/2014 10:39:26 PM       8:15 PM Patient seen and examined. Discussed immediately with Dr. Zenia Resides. No code stroke per Dr. Zenia Resides but will pursue CVA work-up and obtain MRI brain.   Vital signs reviewed and are as follows: BP 150/55 mmHg  Pulse 71  Temp(Src) 98.2 F (36.8 C) (Oral)  Resp 16  SpO2 94%  12:52 AM MR was negative. Patient seen by Dr. Zenia Resides. Patient updated on results. Will discharge to home. Suggested she follow-up with her ophthalmologist regarding her ocular implant and for further evaluation for diplopia. Patient stable in ED. Do not suspect acute stroke given negative MRI findings.  Patient counseled to return if they have weakness in their arms or legs, slurred speech, trouble walking or talking, confusion, trouble with their balance, or if they have any other concerns. Patient verbalizes understanding and agrees with plan.    MDM   Final diagnoses:  Vertical diplopia   Patient presents with complaint of acute onset of diplopia after vomiting. Patient history and exam is consistent with a binocular diplopia. MRI negative for acute stroke. No other neurological deficits. Patient does have an ocular implant and disruption 2/2 vomiting considered, however would expect unilateral monocular diplopia if this were the case. Will have patient follow-up with her ophthalmologist to ensure no difficulties with her implant. Discussed signs and symptoms which would cause her to return. Patient is comfortable with discharge to home with PCP/ophthalmology follow-up.  She seems reliable to return with worsening.  No dangerous or life-threatening conditions suspected or identified by history, physical exam, and by work-up. No indications for hospitalization identified.      Carlisle Cater, PA-C 10/20/14 [phone]

## 2014-10-19 NOTE — ED Notes (Addendum)
Per EMS, pt from home, pt reports double vision after vomiting today.  Pt is A&O x 4. Pt reports vision is okay if one eye is closed.  No neuro deficit noted per EMS at this time.  Pt states that she had called her PCP and was instructed to come to the ED to r/o "brain stem stroke".

## 2014-10-19 NOTE — Discharge Instructions (Signed)
Please read and follow all provided instructions.  Your diagnoses today include:  1. Vertical diplopia     Tests performed today include:  MRI scan of your head that did not show any serious problems or stroke.  Blood counts and electrolytes - normal  Vital signs. See below for your results today.   Medications prescribed:   None  Take any prescribed medications only as directed.  Home care instructions:  Follow any educational materials contained in this packet.  BE VERY CAREFUL not to take multiple medicines containing Tylenol (also called acetaminophen). Doing so can lead to an overdose which can damage your liver and cause liver failure and possibly death.   Follow-up instructions: Please follow-up with your eye specialist in the next 3 days for further evaluation of your symptoms.   Return instructions:  SEEK IMMEDIATE MEDICAL ATTENTION IF:  There is confusion or drowsiness (although children frequently become drowsy after injury).   You cannot awaken the injured person.   You have more than one episode of vomiting.   You notice dizziness or unsteadiness which is getting worse, or inability to walk.   You have convulsions or unconsciousness.   You experience severe, persistent headaches not relieved by Tylenol.  You cannot use arms or legs normally.   There are changes in pupil sizes. (This is the black center in the colored part of the eye)   There is clear or bloody discharge from the nose or ears.   You have change in speech, vision, swallowing, or understanding.   Localized weakness, numbness, tingling, or change in bowel or bladder control.  You have any other emergent concerns.  Your vital signs today were: BP 140/54 mmHg   Pulse 87   Temp(Src) 97.9 F (36.6 C) (Oral)   Resp 16   SpO2 97% If your blood pressure (BP) was elevated above 135/85 this visit, please have this repeated by your doctor within one month. --------------

## 2014-10-22 DIAGNOSIS — H4910 Fourth [trochlear] nerve palsy, unspecified eye: Secondary | ICD-10-CM | POA: Diagnosis not present

## 2014-10-22 DIAGNOSIS — Z961 Presence of intraocular lens: Secondary | ICD-10-CM | POA: Diagnosis not present

## 2014-10-22 DIAGNOSIS — E11329 Type 2 diabetes mellitus with mild nonproliferative diabetic retinopathy without macular edema: Secondary | ICD-10-CM | POA: Diagnosis not present

## 2014-10-23 DIAGNOSIS — E11339 Type 2 diabetes mellitus with moderate nonproliferative diabetic retinopathy without macular edema: Secondary | ICD-10-CM | POA: Diagnosis not present

## 2014-10-23 LAB — HM DIABETES EYE EXAM

## 2014-11-29 ENCOUNTER — Other Ambulatory Visit: Payer: Medicare Other

## 2014-11-29 DIAGNOSIS — E119 Type 2 diabetes mellitus without complications: Secondary | ICD-10-CM

## 2014-11-29 DIAGNOSIS — E785 Hyperlipidemia, unspecified: Secondary | ICD-10-CM | POA: Diagnosis not present

## 2014-11-29 DIAGNOSIS — E039 Hypothyroidism, unspecified: Secondary | ICD-10-CM | POA: Diagnosis not present

## 2014-11-29 DIAGNOSIS — I10 Essential (primary) hypertension: Secondary | ICD-10-CM

## 2014-11-30 LAB — CBC WITH DIFFERENTIAL/PLATELET
Basophils Absolute: 0.1 10*3/uL (ref 0.0–0.2)
Basos: 1 %
EOS (ABSOLUTE): 0.2 10*3/uL (ref 0.0–0.4)
Eos: 3 %
Hematocrit: 37 % (ref 34.0–46.6)
Hemoglobin: 11.7 g/dL (ref 11.1–15.9)
IMMATURE GRANS (ABS): 0 10*3/uL (ref 0.0–0.1)
Immature Granulocytes: 0 %
LYMPHS ABS: 2 10*3/uL (ref 0.7–3.1)
Lymphs: 23 %
MCH: 25.6 pg — ABNORMAL LOW (ref 26.6–33.0)
MCHC: 31.6 g/dL (ref 31.5–35.7)
MCV: 81 fL (ref 79–97)
MONOCYTES: 5 %
MONOS ABS: 0.4 10*3/uL (ref 0.1–0.9)
NEUTROS ABS: 5.8 10*3/uL (ref 1.4–7.0)
NEUTROS PCT: 68 %
PLATELETS: 412 10*3/uL — AB (ref 150–379)
RBC: 4.57 x10E6/uL (ref 3.77–5.28)
RDW: 15.7 % — AB (ref 12.3–15.4)
WBC: 8.5 10*3/uL (ref 3.4–10.8)

## 2014-11-30 LAB — COMPREHENSIVE METABOLIC PANEL
ALK PHOS: 96 IU/L (ref 39–117)
ALT: 14 IU/L (ref 0–32)
AST: 9 IU/L (ref 0–40)
Albumin/Globulin Ratio: 1.5 (ref 1.1–2.5)
Albumin: 3.8 g/dL (ref 3.5–4.8)
BUN/Creatinine Ratio: 18 (ref 11–26)
BUN: 13 mg/dL (ref 8–27)
Bilirubin Total: 0.4 mg/dL (ref 0.0–1.2)
CALCIUM: 9.5 mg/dL (ref 8.7–10.3)
CHLORIDE: 99 mmol/L (ref 97–108)
CO2: 25 mmol/L (ref 18–29)
Creatinine, Ser: 0.74 mg/dL (ref 0.57–1.00)
GFR calc Af Amer: 91 mL/min/{1.73_m2} (ref >59)
GFR, EST NON AFRICAN AMERICAN: 79 mL/min/{1.73_m2} (ref >59)
Globulin, Total: 2.5 g/dL (ref 1.5–4.5)
Glucose: 202 mg/dL — ABNORMAL HIGH (ref 65–99)
Potassium: 3.8 mmol/L (ref 3.5–5.2)
Sodium: 142 mmol/L (ref 134–144)
TOTAL PROTEIN: 6.3 g/dL (ref 6.0–8.5)

## 2014-11-30 LAB — LIPID PANEL
CHOL/HDL RATIO: 5.1 ratio — AB (ref 0.0–4.4)
Cholesterol, Total: 203 mg/dL — ABNORMAL HIGH (ref 100–199)
HDL: 40 mg/dL (ref >39)
LDL Calculated: 113 mg/dL — ABNORMAL HIGH (ref 0–99)
Triglycerides: 251 mg/dL — ABNORMAL HIGH (ref 0–149)
VLDL Cholesterol Cal: 50 mg/dL — ABNORMAL HIGH (ref 5–40)

## 2014-11-30 LAB — TSH: TSH: 1.93 u[IU]/mL (ref 0.450–4.500)

## 2014-12-05 ENCOUNTER — Encounter: Payer: Self-pay | Admitting: Internal Medicine

## 2014-12-05 ENCOUNTER — Ambulatory Visit (INDEPENDENT_AMBULATORY_CARE_PROVIDER_SITE_OTHER): Payer: Medicare Other | Admitting: Internal Medicine

## 2014-12-05 VITALS — BP 124/74 | HR 77 | Temp 98.3°F | Resp 20 | Ht 62.0 in | Wt 197.2 lb

## 2014-12-05 DIAGNOSIS — E113519 Type 2 diabetes mellitus with proliferative diabetic retinopathy with macular edema, unspecified eye: Secondary | ICD-10-CM

## 2014-12-05 DIAGNOSIS — E785 Hyperlipidemia, unspecified: Secondary | ICD-10-CM | POA: Diagnosis not present

## 2014-12-05 DIAGNOSIS — E11351 Type 2 diabetes mellitus with proliferative diabetic retinopathy with macular edema: Secondary | ICD-10-CM

## 2014-12-05 DIAGNOSIS — E039 Hypothyroidism, unspecified: Secondary | ICD-10-CM

## 2014-12-05 DIAGNOSIS — K224 Dyskinesia of esophagus: Secondary | ICD-10-CM | POA: Diagnosis not present

## 2014-12-05 DIAGNOSIS — I10 Essential (primary) hypertension: Secondary | ICD-10-CM | POA: Diagnosis not present

## 2014-12-05 DIAGNOSIS — G894 Chronic pain syndrome: Secondary | ICD-10-CM

## 2014-12-05 DIAGNOSIS — M545 Low back pain: Secondary | ICD-10-CM | POA: Diagnosis not present

## 2014-12-05 MED ORDER — INSULIN NPH (HUMAN) (ISOPHANE) 100 UNIT/ML ~~LOC~~ SUSP: SUBCUTANEOUS | Status: DC

## 2014-12-05 NOTE — Progress Notes (Signed)
Patient ID: [name], female   DOB: Oct 07, 1940, 74 y.o.   MRN: [id number]    Facility  [facility]    Place of Service:   OFFICE    Allergies  Allergen Reactions  . Ciprofloxacin Rash  . Advair Diskus [Fluticasone-Salmeterol] Other (See Comments)    Throat closes  . Albuterol     REACTION: closes throat  . Alendronate Sodium Itching  . Benadryl [Diphenhydramine Hcl]   . Benzonatate     REACTION: rash/hives  . Cephalexin Nausea Only  . Gabapentin Other (See Comments)    Loss of memory  . Iohexol      Desc: rash and DIF BREATHING   . Keflex [Cephalexin]   . Morphine Nausea And Vomiting  . Other     Decongestants  . Propoxyphene Hcl   . Reclast [Zoledronic Acid] Other (See Comments)    Chest pain  . Avapro [Irbesartan] Rash  . Codeine Nausea And Vomiting, Swelling and Rash  . Tessalon Perles Rash  . Tramadol Nausea Only and Rash    Chief Complaint  Patient presents with  . Follow-up    still having pain in hips and back (severe)    HPI:  Patient has had lower back discomfort, hip discomfort and unstable gait. There is a history of falls. Pains are significant enough to interfere with sleep.  She complains of dry skin.   She complains of a dry mouth. She has been using Biotin mouth spray.  Blood pressure has been under good control.  Last lipid panel showed slightly elevated triglycerides and LDL.  Diabetic control continues to be a problem. Last fasting glucose was 202. Unfortunately the A1c was not done. The last known value was 05/31/2014 at 8.8%.    Medications: Patient's Medications  New Prescriptions   No medications on file  Previous Medications   AMLODIPINE (NORVASC) 2.5 MG TABLET    Take 1 tablet (2.5 mg total) by mouth daily.   ASPIRIN 81 MG TABLET    Take 81 mg by mouth daily.    CETIRIZINE (ZYRTEC) 10 MG TABLET    Take 10 mg by mouth daily as needed for allergies. For allergies   CHLORPHENIRAMINE-HYDROCODONE (TUSSIONEX) 10-8 MG/5ML LQCR    Take 5  mLs by mouth every 12 (twelve) hours. Take scheduled for 1 week and then as needed for cough.   CHOLECALCIFEROL (VITAMIN D3) 5000 UNITS CAPS    Take 1 tablet by mouth daily. For supplement   CITALOPRAM (CELEXA) 40 MG TABLET    1 by mouth daily for depression   DOCUSATE SODIUM (COLACE) 100 MG CAPSULE    Take 1 capsule (100 mg total) by mouth 2 (two) times daily.   GLIPIZIDE (GLUCOTROL XL) 10 MG 24 HR TABLET    Take 1 tablet (10 mg total) by mouth 2 (two) times daily. Take in the morning and evening to control blood sugar   HUMULIN N 100 UNIT/ML INJECTION    INJECT 25 UNITS SUBCUTANEOUSLY TWICE A DAY   LOSARTAN-HYDROCHLOROTHIAZIDE (HYZAAR) 50-12.5 MG PER TABLET    TAKE 1 TABLET BY MOUTH EVERY DAY TO CONTROL BLOOD PRESSURE   MAGNESIUM OXIDE 250 MG TABS    Take 250 mg by mouth daily.    NITROGLYCERIN (NITROSTAT) 0.4 MG SL TABLET    Place 1 tablet (0.4 mg total) under the tongue every 5 (five) minutes as needed. For chest pain. Maximum of 3 tablets in 15 minutes.   PRILOSEC 40 MG CAPSULE    TAKE 1  CAPSULE TWICE DAILY FOR ACID REFLUX  Modified Medications   No medications on file  Discontinued Medications   ATORVASTATIN (LIPITOR) 40 MG TABLET    Take 1 tablet (40 mg total) by mouth daily at 6 PM.   CITALOPRAM (CELEXA) 40 MG TABLET    Take 0.5 tablets (20 mg total) by mouth daily.   FUROSEMIDE (LASIX) 40 MG TABLET    One each morning to treat edema   POLYETHYLENE GLYCOL (MIRALAX) PACKET    Take 17 g by mouth daily as needed for mild constipation.   VALSARTAN-HYDROCHLOROTHIAZIDE (DIOVAN HCT) 80-12.5 MG PER TABLET    Take 1 tablet by mouth daily.     Review of Systems  Constitutional: Positive for fatigue. Negative for fever, diaphoresis and unexpected weight change.       Obese gaining weight. Patient had previous bypass stomach surgery, but has failed to sustain the weight that was lost after surgery.  HENT: Negative.   Eyes: Negative.   Respiratory: Negative.   Cardiovascular: Negative for chest  pain, palpitations and leg swelling.  Gastrointestinal: Negative.   Endocrine:       Diabetic.  Genitourinary:       History of vaginal itching post antibiotic treatment. Generally does better with antifungal tablet.  Musculoskeletal:       Complaints of muscular weakness. Degenerative joint disease symptoms are present. He has a history of gout. There is some restriction in joint motion and stiffness present in multiple joints. She has a chronic backache. She is unstable when walking.  Skin:       Dry, itchy skin.  Allergic/Immunologic: Negative.   Neurological:       Chronic amounts difficulties. Walks wobbly and unsteady. History of headaches including migraine headaches.  Hematological: Negative.   Psychiatric/Behavioral:       Depressive symptoms. History of memory problems. Increased stress. Nervous. Marriage is somewhat rocky.    Filed Vitals:   12/05/14 1430  BP: 124/74  Pulse: 77  Temp: 98.3 F (36.8 C)  TempSrc: Oral  Resp: 20  Height: _0  (1.575 m)  Weight: 197 lb 3.2 oz (89.449 kg)  SpO2: 95%   Body mass index is 36.06 kg/(m^2).  Physical Exam  Constitutional: She is oriented to person, place, and time. She appears distressed.  Wheezy and mildly distressed.  HENT:  Head: Normocephalic and atraumatic.  Right Ear: External ear normal.  Left Ear: External ear normal.  Nose: Nose normal.  Mouth/Throat: Oropharynx is clear and moist.  Eyes: Conjunctivae and EOM are normal. Pupils are equal, round, and reactive to light.  Neck: No JVD present. No tracheal deviation present. No thyromegaly present.  Cardiovascular: Normal rate, regular rhythm, normal heart sounds and intact distal pulses.   Pulmonary/Chest: No respiratory distress. She has no wheezes. She has no rales. She exhibits no tenderness.  Abdominal: She exhibits no distension and no mass. There is no tenderness.  Musculoskeletal: She exhibits edema (tight 2-3 + bilaterally) and tenderness.  Unstable  gait. Multiple joints are tender but do not appear to be inflamed. Lower back discomfort to palpation.  Lymphadenopathy:    She has no cervical adenopathy.  Neurological: She is alert and oriented to person, place, and time. She has normal reflexes. A cranial nerve deficit is present. Coordination normal.  Skin:  Single, soft, mobile nodule in the right supraclavicular area 6 mm x 10 mm diameter.  Psychiatric: Judgment and thought content normal.  Seems depressed and anxious.     Labs reviewed:  Appointment on 11/29/2014  Component Date Value Ref Range Status  . Glucose 11/29/2014 202* 65 - 99 mg/dL Final  . BUN 11/29/2014 13  8 - 27 mg/dL Final  . Creatinine, Ser 11/29/2014 0.74  0.57 - 1.00 mg/dL Final  . GFR calc non Af Amer 11/29/2014 79  >59 mL/min/1.73 Final  . GFR calc Af Amer 11/29/2014 91  >59 mL/min/1.73 Final  . BUN/Creatinine Ratio 11/29/2014 18  11 - 26 Final  . Sodium 11/29/2014 142  134 - 144 mmol/L Final  . Potassium 11/29/2014 3.8  3.5 - 5.2 mmol/L Final  . Chloride 11/29/2014 99  97 - 108 mmol/L Final  . CO2 11/29/2014 25  18 - 29 mmol/L Final  . Calcium 11/29/2014 9.5  8.7 - 10.3 mg/dL Final  . Total Protein 11/29/2014 6.3  6.0 - 8.5 g/dL Final  . Albumin 11/29/2014 3.8  3.5 - 4.8 g/dL Final  . Globulin, Total 11/29/2014 2.5  1.5 - 4.5 g/dL Final  . Albumin/Globulin Ratio 11/29/2014 1.5  1.1 - 2.5 Final  . Bilirubin Total 11/29/2014 0.4  0.0 - 1.2 mg/dL Final  . Alkaline Phosphatase 11/29/2014 96  39 - 117 IU/L Final  . AST 11/29/2014 9  0 - 40 IU/L Final  . ALT 11/29/2014 14  0 - 32 IU/L Final  . Cholesterol, Total 11/29/2014 203* 100 - 199 mg/dL Final  . Triglycerides 11/29/2014 251* 0 - 149 mg/dL Final  . HDL 11/29/2014 40  >39 mg/dL Final   Comment: According to ATP-III Guidelines, HDL-C >59 mg/dL is considered a negative risk factor for CHD.   Marland Kitchen VLDL Cholesterol Cal 11/29/2014 50* 5 - 40 mg/dL Final  . LDL Calculated 11/29/2014 113* 0 - 99 mg/dL Final    . Chol/HDL Ratio 11/29/2014 5.1* 0.0 - 4.4 ratio units Final   Comment:                                   T. Chol/HDL Ratio                                             Men  Women                               1/2 Avg.Risk  3.4    3.3                                   Avg.Risk  5.0    4.4                                2X Avg.Risk  9.6    7.1                                3X Avg.Risk 23.4   11.0   . WBC 11/29/2014 8.5  3.4 - 10.8 x10E3/uL Final  . RBC 11/29/2014 4.57  3.77 - 5.28 x10E6/uL Final  . Hemoglobin 11/29/2014 11.7  11.1 - 15.9 g/dL Final  . Hematocrit 11/29/2014 37.0  34.0 - 46.6 % Final  .  MCV 11/29/2014 81  79 - 97 fL Final  . MCH 11/29/2014 25.6* 26.6 - 33.0 pg Final  . MCHC 11/29/2014 31.6  31.5 - 35.7 g/dL Final  . RDW 11/29/2014 15.7* 12.3 - 15.4 % Final  . Platelets 11/29/2014 412* 150 - 379 x10E3/uL Final  . NEUTROPHILS 11/29/2014 68   Final  . Lymphs 11/29/2014 23   Final  . Monocytes 11/29/2014 5   Final  . Eos 11/29/2014 3   Final  . Basos 11/29/2014 1   Final  . Neutrophils Absolute 11/29/2014 5.8  1.4 - 7.0 x10E3/uL Final  . Lymphocytes Absolute 11/29/2014 2.0  0.7 - 3.1 x10E3/uL Final  . Monocytes Absolute 11/29/2014 0.4  0.1 - 0.9 x10E3/uL Final  . EOS (ABSOLUTE) 11/29/2014 0.2  0.0 - 0.4 x10E3/uL Final  . Basophils Absolute 11/29/2014 0.1  0.0 - 0.2 x10E3/uL Final  . Immature Granulocytes 11/29/2014 0   Final  . Immature Grans (Abs) 11/29/2014 0.0  0.0 - 0.1 x10E3/uL Final  . TSH 11/29/2014 1.930  0.450 - 4.500 uIU/mL Final  Admission on 10/19/2014, Discharged on 10/20/2014  Component Date Value Ref Range Status  . WBC 10/19/2014 9.0  4.0 - 10.5 K/uL Final  . RBC 10/19/2014 4.59  3.87 - 5.11 MIL/uL Final  . Hemoglobin 10/19/2014 11.9* 12.0 - 15.0 g/dL Final  . HCT 10/19/2014 38.6  36.0 - 46.0 % Final  . MCV 10/19/2014 84.1  78.0 - 100.0 fL Final  . MCH 10/19/2014 25.9* 26.0 - 34.0 pg Final  . MCHC 10/19/2014 30.8  30.0 - 36.0 g/dL Final  . RDW  10/19/2014 15.5  11.5 - 15.5 % Final  . Platelets 10/19/2014 285  150 - 400 K/uL Final  . Neutrophils Relative % 10/19/2014 70  43 - 77 % Final  . Neutro Abs 10/19/2014 6.4  1.7 - 7.7 K/uL Final  . Lymphocytes Relative 10/19/2014 23  12 - 46 % Final  . Lymphs Abs 10/19/2014 2.0  0.7 - 4.0 K/uL Final  . Monocytes Relative 10/19/2014 5  3 - 12 % Final  . Monocytes Absolute 10/19/2014 0.4  0.1 - 1.0 K/uL Final  . Eosinophils Relative 10/19/2014 1  0 - 5 % Final  . Eosinophils Absolute 10/19/2014 0.1  0.0 - 0.7 K/uL Final  . Basophils Relative 10/19/2014 1  0 - 1 % Final  . Basophils Absolute 10/19/2014 0.1  0.0 - 0.1 K/uL Final  . Sodium 10/19/2014 138  135 - 145 mmol/L Final  . Potassium 10/19/2014 4.1  3.5 - 5.1 mmol/L Final  . Chloride 10/19/2014 103  101 - 111 mmol/L Final  . CO2 10/19/2014 25  22 - 32 mmol/L Final  . Glucose, Bld 10/19/2014 187* 65 - 99 mg/dL Final  . BUN 10/19/2014 18  6 - 20 mg/dL Final  . Creatinine, Ser 10/19/2014 0.80  0.44 - 1.00 mg/dL Final  . Calcium 10/19/2014 9.1  8.9 - 10.3 mg/dL Final  . Total Protein 10/19/2014 7.0  6.5 - 8.1 g/dL Final  . Albumin 10/19/2014 3.8  3.5 - 5.0 g/dL Final  . AST 10/19/2014 17  15 - 41 U/L Final  . ALT 10/19/2014 17  14 - 54 U/L Final  . Alkaline Phosphatase 10/19/2014 92  38 - 126 U/L Final  . Total Bilirubin 10/19/2014 0.7  0.3 - 1.2 mg/dL Final  . GFR calc non Af Amer 10/19/2014 >60  >60 mL/min Final  . GFR calc Af Amer 10/19/2014 >60  >60 mL/min Final  Comment: (NOTE) The eGFR has been calculated using the CKD EPI equation. This calculation has not been validated in all clinical situations. eGFR's persistently <60 mL/min signify possible Chronic Kidney Disease.   . Anion gap 10/19/2014 10  5 - 15 Final  . Troponin i, poc 10/19/2014 0.00  0.00 - 0.08 ng/mL Final  . Comment 3 10/19/2014          Final   Comment: Due to the release kinetics of cTnI, a negative result within the first hours of the onset of symptoms  does not rule out myocardial infarction with certainty. If myocardial infarction is still suspected, repeat the test at appropriate intervals.   . Opiates 10/19/2014 NONE DETECTED  NONE DETECTED Final  . Cocaine 10/19/2014 NONE DETECTED  NONE DETECTED Final  . Benzodiazepines 10/19/2014 NONE DETECTED  NONE DETECTED Final  . Amphetamines 10/19/2014 NONE DETECTED  NONE DETECTED Final  . Tetrahydrocannabinol 10/19/2014 NONE DETECTED  NONE DETECTED Final  . Barbiturates 10/19/2014 NONE DETECTED  NONE DETECTED Final   Comment:        DRUG SCREEN FOR MEDICAL PURPOSES ONLY.  IF CONFIRMATION IS NEEDED FOR ANY PURPOSE, NOTIFY LAB WITHIN 5 DAYS.        LOWEST DETECTABLE LIMITS FOR URINE DRUG SCREEN Drug Class       Cutoff (ng/mL) Amphetamine      1000 Barbiturate      200 Benzodiazepine   641 Tricyclics       583 Opiates          300 Cocaine          300 THC              50   . Color, Urine 10/19/2014 YELLOW  YELLOW Final  . APPearance 10/19/2014 CLEAR  CLEAR Final  . Specific Gravity, Urine 10/19/2014 1.005  1.005 - 1.030 Final  . pH 10/19/2014 7.0  5.0 - 8.0 Final  . Glucose, UA 10/19/2014 NEGATIVE  NEGATIVE mg/dL Final  . Hgb urine dipstick 10/19/2014 NEGATIVE  NEGATIVE Final  . Bilirubin Urine 10/19/2014 NEGATIVE  NEGATIVE Final  . Ketones, ur 10/19/2014 NEGATIVE  NEGATIVE mg/dL Final  . Protein, ur 10/19/2014 NEGATIVE  NEGATIVE mg/dL Final  . Urobilinogen, UA 10/19/2014 0.2  0.0 - 1.0 mg/dL Final  . Nitrite 10/19/2014 NEGATIVE  NEGATIVE Final  . Leukocytes, UA 10/19/2014 NEGATIVE  NEGATIVE Final   MICROSCOPIC NOT DONE ON URINES WITH NEGATIVE PROTEIN, BLOOD, LEUKOCYTES, NITRITE, OR GLUCOSE <1000 mg/dL.  Marland Kitchen Glucose-Capillary 10/19/2014 159* 65 - 99 mg/dL Final     Assessment/Plan  1. Essential hypertension, benign - Comprehensive metabolic panel; Future  2. Chronic pain syndrome Continue current pain medications  3. Low back pain, unspecified back pain laterality, with  sciatica presence unspecified Continue current pain medication  4. Hypothyroidism, unspecified hypothyroidism type -TSH  5. Type 2 diabetes mellitus with proliferative diabetic retinopathy with macular edema - insulin NPH Human (HUMULIN N) 100 UNIT/ML injection; INJECT 30 UNITS SUBCUTANEOUSLY TWICE A DAY  Dispense: 10 mL; Refill: 3 - Hemoglobin A1c; Future - Comprehensive metabolic panel; Future  6. HLD (hyperlipidemia) - Lipid panel; Future  7. Esophageal spasm Continue Prilosec

## 2015-01-02 ENCOUNTER — Other Ambulatory Visit: Payer: Self-pay | Admitting: *Deleted

## 2015-01-02 MED ORDER — CITALOPRAM HYDROBROMIDE 40 MG PO TABS: 40.0000 mg | ORAL_TABLET | Freq: Every day | ORAL | Status: DC

## 2015-01-31 ENCOUNTER — Other Ambulatory Visit: Payer: Self-pay | Admitting: *Deleted

## 2015-01-31 DIAGNOSIS — E113519 Type 2 diabetes mellitus with proliferative diabetic retinopathy with macular edema, unspecified eye: Secondary | ICD-10-CM

## 2015-01-31 MED ORDER — INSULIN NPH (HUMAN) (ISOPHANE) 100 UNIT/ML ~~LOC~~ SUSP: SUBCUTANEOUS | Status: DC

## 2015-01-31 MED ORDER — OMEPRAZOLE 40 MG PO CPDR: DELAYED_RELEASE_CAPSULE | ORAL | Status: DC

## 2015-01-31 NOTE — Telephone Encounter (Signed)
Patient requested Rx to be faxed to [facility]

## 2015-02-05 ENCOUNTER — Other Ambulatory Visit: Payer: Self-pay | Admitting: *Deleted

## 2015-02-05 MED ORDER — LOSARTAN POTASSIUM-HCTZ 50-12.5 MG PO TABS: ORAL_TABLET | ORAL | Status: DC

## 2015-02-05 NOTE — Telephone Encounter (Signed)
Patient called back and stated that she does not want the Rx faxed to [place] instead she wants it faxed to [place]. Refaxed and informed her to call [facility] and let them know she does not want the Medication sent to her.

## 2015-02-05 NOTE — Telephone Encounter (Signed)
Patient requested medication to be faxed to [facility]

## 2015-03-04 ENCOUNTER — Other Ambulatory Visit: Payer: Self-pay

## 2015-03-04 MED ORDER — OMEPRAZOLE 40 MG PO CPDR: DELAYED_RELEASE_CAPSULE | ORAL | Status: DC

## 2015-03-26 ENCOUNTER — Telehealth: Payer: Self-pay | Admitting: *Deleted

## 2015-03-26 NOTE — Telephone Encounter (Signed)
Patient called and left message and stated that she needed a refill on her Humalin N Insulin. Need Pharmacy . Patient also stated that she needs a refill on Prilosec she took 20mg  in the morning and 40mg  in the evening, but they no longer make Prilosec and patient cannot take Omeprazole because it makes her sick. Would like some comparable. Please Advise.

## 2015-03-27 MED ORDER — INSULIN NPH (HUMAN) (ISOPHANE) 100 UNIT/ML ~~LOC~~ SUSP: SUBCUTANEOUS | Status: DC

## 2015-03-27 MED ORDER — PANTOPRAZOLE SODIUM 40 MG PO TBEC: 40.0000 mg | DELAYED_RELEASE_TABLET | Freq: Every day | ORAL | Status: DC

## 2015-03-27 NOTE — Telephone Encounter (Signed)
It is OK for her to use 31 units Insulin

## 2015-03-27 NOTE — Telephone Encounter (Signed)
RX's sent, patient needed 90 day supplies through [facility]. I called patient to inform her rx's sent in, unable to leave message, no voicemail.

## 2015-03-27 NOTE — Telephone Encounter (Signed)
Patient called back because she had not gotten a response yet.   1.) Patient states she is on Humalin 31 units BID (patient changed on her own because she felt the need to), Dr.Green please advise if you are in agreement with this change that patient made.  2.) Patient needs alternative to Prilosec (can not take Omeprazole). Please advise   Medications should be sent to [facility]

## 2015-03-27 NOTE — Telephone Encounter (Signed)
It is okay to refill the Humulin N.  Cancel omeprazole. Start pantoprazole 40 mg (dispense 30 tablets) take 1 daily to suppress stomach acid.

## 2015-03-29 NOTE — Telephone Encounter (Signed)
Called patient, no answer, no voicemail

## 2015-04-03 ENCOUNTER — Ambulatory Visit: Payer: Medicare Other | Admitting: Internal Medicine

## 2015-04-03 NOTE — Telephone Encounter (Signed)
Patient never returned call. Phone note closed

## 2015-04-08 ENCOUNTER — Other Ambulatory Visit: Payer: Medicare Other

## 2015-04-10 ENCOUNTER — Ambulatory Visit: Payer: Medicare Other | Admitting: Internal Medicine

## 2015-04-10 ENCOUNTER — Other Ambulatory Visit: Payer: Self-pay

## 2015-04-10 MED ORDER — CITALOPRAM HYDROBROMIDE 40 MG PO TABS: 40.0000 mg | ORAL_TABLET | Freq: Every day | ORAL | Status: DC

## 2015-04-10 NOTE — Telephone Encounter (Signed)
Message was left on triage voicemail by patient requesting 90 day refill on Citalopram

## 2015-04-16 DIAGNOSIS — H43812 Vitreous degeneration, left eye: Secondary | ICD-10-CM | POA: Diagnosis not present

## 2015-04-16 DIAGNOSIS — E113293 Type 2 diabetes mellitus with mild nonproliferative diabetic retinopathy without macular edema, bilateral: Secondary | ICD-10-CM | POA: Diagnosis not present

## 2015-04-16 DIAGNOSIS — H35372 Puckering of macula, left eye: Secondary | ICD-10-CM | POA: Diagnosis not present

## 2015-04-16 DIAGNOSIS — Z961 Presence of intraocular lens: Secondary | ICD-10-CM | POA: Diagnosis not present

## 2015-04-29 ENCOUNTER — Other Ambulatory Visit: Payer: Medicare Other

## 2015-04-29 DIAGNOSIS — I10 Essential (primary) hypertension: Secondary | ICD-10-CM | POA: Diagnosis not present

## 2015-04-29 DIAGNOSIS — E785 Hyperlipidemia, unspecified: Secondary | ICD-10-CM

## 2015-04-29 DIAGNOSIS — E113519 Type 2 diabetes mellitus with proliferative diabetic retinopathy with macular edema, unspecified eye: Secondary | ICD-10-CM | POA: Diagnosis not present

## 2015-04-30 LAB — COMPREHENSIVE METABOLIC PANEL
ALT: 15 IU/L (ref 0–32)
AST: 16 IU/L (ref 0–40)
Albumin/Globulin Ratio: 1.5 (ref 1.1–2.5)
Albumin: 3.4 g/dL — ABNORMAL LOW (ref 3.5–4.8)
Alkaline Phosphatase: 102 IU/L (ref 39–117)
BUN/Creatinine Ratio: 16 (ref 11–26)
BUN: 14 mg/dL (ref 8–27)
Bilirubin Total: 0.4 mg/dL (ref 0.0–1.2)
CALCIUM: 9.1 mg/dL (ref 8.7–10.3)
CO2: 26 mmol/L (ref 18–29)
CREATININE: 0.86 mg/dL (ref 0.57–1.00)
Chloride: 101 mmol/L (ref 97–106)
GFR calc Af Amer: 76 mL/min/{1.73_m2} (ref >59)
GFR calc non Af Amer: 66 mL/min/{1.73_m2} (ref >59)
Globulin, Total: 2.2 g/dL (ref 1.5–4.5)
Glucose: 108 mg/dL — ABNORMAL HIGH (ref 65–99)
Potassium: 4.1 mmol/L (ref 3.5–5.2)
Sodium: 141 mmol/L (ref 136–144)
Total Protein: 5.6 g/dL — ABNORMAL LOW (ref 6.0–8.5)

## 2015-04-30 LAB — LIPID PANEL
Chol/HDL Ratio: 5.3 ratio units — ABNORMAL HIGH (ref 0.0–4.4)
Cholesterol, Total: 184 mg/dL (ref 100–199)
HDL: 35 mg/dL — ABNORMAL LOW (ref >39)
LDL CALC: 108 mg/dL — AB (ref 0–99)
TRIGLYCERIDES: 204 mg/dL — AB (ref 0–149)
VLDL CHOLESTEROL CAL: 41 mg/dL — AB (ref 5–40)

## 2015-04-30 LAB — HEMOGLOBIN A1C
Est. average glucose Bld gHb Est-mCnc: 212 mg/dL
HEMOGLOBIN A1C: 9 % — AB (ref 4.8–5.6)

## 2015-05-01 ENCOUNTER — Other Ambulatory Visit: Payer: Self-pay | Admitting: *Deleted

## 2015-05-01 ENCOUNTER — Ambulatory Visit (INDEPENDENT_AMBULATORY_CARE_PROVIDER_SITE_OTHER): Payer: Medicare Other | Admitting: Internal Medicine

## 2015-05-01 ENCOUNTER — Encounter: Payer: Self-pay | Admitting: Internal Medicine

## 2015-05-01 VITALS — BP 126/76 | HR 100 | Temp 98.2°F | Resp 20 | Ht 62.0 in | Wt 194.0 lb

## 2015-05-01 DIAGNOSIS — E113513 Type 2 diabetes mellitus with proliferative diabetic retinopathy with macular edema, bilateral: Secondary | ICD-10-CM

## 2015-05-01 DIAGNOSIS — E039 Hypothyroidism, unspecified: Secondary | ICD-10-CM

## 2015-05-01 DIAGNOSIS — I10 Essential (primary) hypertension: Secondary | ICD-10-CM | POA: Diagnosis not present

## 2015-05-01 DIAGNOSIS — K2100 Gastro-esophageal reflux disease with esophagitis, without bleeding: Secondary | ICD-10-CM

## 2015-05-01 DIAGNOSIS — E785 Hyperlipidemia, unspecified: Secondary | ICD-10-CM

## 2015-05-01 DIAGNOSIS — Z23 Encounter for immunization: Secondary | ICD-10-CM

## 2015-05-01 DIAGNOSIS — Z794 Long term (current) use of insulin: Secondary | ICD-10-CM | POA: Diagnosis not present

## 2015-05-01 DIAGNOSIS — H543 Unqualified visual loss, both eyes: Principal | ICD-10-CM

## 2015-05-01 DIAGNOSIS — G894 Chronic pain syndrome: Secondary | ICD-10-CM | POA: Diagnosis not present

## 2015-05-01 MED ORDER — INSULIN REGULAR HUMAN 100 UNIT/ML IJ SOLN: INTRAMUSCULAR | Status: DC

## 2015-05-01 MED ORDER — GLIPIZIDE ER 10 MG PO TB24: ORAL_TABLET | ORAL | Status: DC

## 2015-05-01 NOTE — Telephone Encounter (Signed)
Patient requested refill to be faxed to pharmacy.  

## 2015-05-01 NOTE — Progress Notes (Signed)
Patient ID: [name], female   DOB: Aug 22, 1940, 74 y.o.   MRN: [id number]    Facility  [facility]    Place of Service:   OFFICE    Allergies  Allergen Reactions  . Ciprofloxacin Rash  . Advair Diskus [Fluticasone-Salmeterol] Other (See Comments)    Throat closes  . Albuterol     REACTION: closes throat  . Alendronate Sodium Itching  . Benadryl [Diphenhydramine Hcl]   . Benzonatate     REACTION: rash/hives  . Cephalexin Nausea Only  . Gabapentin Other (See Comments)    Loss of memory  . Iohexol      Desc: rash and DIF BREATHING   . Keflex [Cephalexin]   . Morphine Nausea And Vomiting  . Other     Decongestants  . Propoxyphene Hcl   . Reclast [Zoledronic Acid] Other (See Comments)    Chest pain  . Avapro [Irbesartan] Rash  . Codeine Nausea And Vomiting, Swelling and Rash  . Tessalon Perles Rash  . Tramadol Nausea Only and Rash    Chief Complaint  Patient presents with  . Medical Management of Chronic Issues    4 month follow-up for Hypertension, DM     HPI:  Pain in back and hips - chronic and unchanged  Gastroesophageal reflux disease with esophagitis - asymptomatic as long as she remains on pantoprazole  Essential hypertension, benign - adequately controlled  Type 2 diabetes mellitus with both eyes affected by proliferative retinopathy and macular edema, with long-term current use of insulin (HCC) = hemoglobin A1c is running high. Patient was advised to continue to try to diet and watch intake appropriately. She has been slack about trying to do anything much more and seems defeated regarding the idea that she can do more to lose weight.  HLD (hyperlipidemia) - controlled  Hypothyroidism, unspecified hypothyroidism type - compensated  Chronic pain syndrome - multiple myalgias and arthralgias    Medications: Patient's Medications  New Prescriptions   No medications on file  Previous Medications   AMLODIPINE (NORVASC) 2.5 MG TABLET    Take 1 tablet (2.5 mg  total) by mouth daily.   ASPIRIN 81 MG TABLET    Take 81 mg by mouth daily.    CETIRIZINE (ZYRTEC) 10 MG TABLET    Take 10 mg by mouth daily as needed for allergies. For allergies   CHLORPHENIRAMINE-HYDROCODONE (TUSSIONEX) 10-8 MG/5ML LQCR    Take 5 mLs by mouth every 12 (twelve) hours. Take scheduled for 1 week and then as needed for cough.   CHOLECALCIFEROL (VITAMIN D3) 5000 UNITS CAPS    Take 1 tablet by mouth daily. For supplement   CITALOPRAM (CELEXA) 40 MG TABLET    Take 1 tablet (40 mg total) by mouth daily. 1 by mouth daily for depression   DOCUSATE SODIUM (COLACE) 100 MG CAPSULE    Take 1 capsule (100 mg total) by mouth 2 (two) times daily.   GLIPIZIDE (GLUCOTROL XL) 10 MG 24 HR TABLET    Take 1 tablet (10 mg total) by mouth 2 (two) times daily. Take in the morning and evening to control blood sugar   INSULIN NPH HUMAN (HUMULIN N) 100 UNIT/ML INJECTION    Inject 31 units in the morning and 31 units in the evening to control blood sugar   LOSARTAN-HYDROCHLOROTHIAZIDE (HYZAAR) 50-12.5 MG PER TABLET    Take one tablet by mouth once daily to control blood pressure   MAGNESIUM OXIDE 250 MG TABS    Take 250 mg  by mouth daily.    NITROGLYCERIN (NITROSTAT) 0.4 MG SL TABLET    Place 1 tablet (0.4 mg total) under the tongue every 5 (five) minutes as needed. For chest pain. Maximum of 3 tablets in 15 minutes.   OMEPRAZOLE (PRILOSEC) 40 MG CAPSULE    TAKE 1 CAPSULE BY MOUTH TWICE DAILY FOR STOMACH   PANTOPRAZOLE (PROTONIX) 40 MG TABLET    Take 1 tablet (40 mg total) by mouth daily.  Modified Medications   No medications on file  Discontinued Medications   No medications on file    Review of Systems  Constitutional: Positive for fatigue. Negative for fever, diaphoresis and unexpected weight change.       Obese gaining weight. Patient had previous bypass stomach surgery, but has failed to sustain the weight that was lost after surgery.  HENT: Negative.   Eyes: Negative.   Respiratory: Negative.     Cardiovascular: Negative for chest pain, palpitations and leg swelling.  Gastrointestinal: Negative.   Endocrine:       Diabetic.  Genitourinary:       History of vaginal itching post antibiotic treatment. Generally does better with antifungal tablet.  Musculoskeletal:       Complaints of muscular weakness. Degenerative joint disease symptoms are present. He has a history of gout. There is some restriction in joint motion and stiffness present in multiple joints. She has a chronic backache. She is unstable when walking.  Skin:       Dry, itchy skin.  Allergic/Immunologic: Negative.   Neurological:       Chronic amounts difficulties. Walks wobbly and unsteady. History of headaches including migraine headaches.  Hematological: Negative.   Psychiatric/Behavioral:       Depressive symptoms. History of memory problems. Increased stress. Nervous. Marriage is somewhat rocky.    Filed Vitals:   05/01/15 1237  BP: 126/76  Pulse: 100  Temp: 98.2 F (36.8 C)  TempSrc: Oral  Resp: 20  Height: '5\' 2"'  (1.575 m)  Weight: 194 lb (87.998 kg)  SpO2: 94%   Body mass index is 35.47 kg/(m^2).  Physical Exam  Constitutional: She is oriented to person, place, and time. She appears distressed.  Wheezy and mildly distressed.  HENT:  Head: Normocephalic and atraumatic.  Right Ear: External ear normal.  Left Ear: External ear normal.  Nose: Nose normal.  Mouth/Throat: Oropharynx is clear and moist.  Eyes: Conjunctivae and EOM are normal. Pupils are equal, round, and reactive to light.  Neck: No JVD present. No tracheal deviation present. No thyromegaly present.  Cardiovascular: Normal rate, regular rhythm, normal heart sounds and intact distal pulses.   Pulmonary/Chest: No respiratory distress. She has no wheezes. She has no rales. She exhibits no tenderness.  Abdominal: She exhibits no distension and no mass. There is no tenderness.  Musculoskeletal: She exhibits edema (tight 2-3 + bilaterally)  and tenderness.  Unstable gait. Multiple joints are tender but do not appear to be inflamed. Lower back discomfort to palpation.  Lymphadenopathy:    She has no cervical adenopathy.  Neurological: She is alert and oriented to person, place, and time. She has normal reflexes. A cranial nerve deficit is present. Coordination normal.  Skin:  Single, soft, mobile nodule in the right supraclavicular area 6 mm x 10 mm diameter.  Psychiatric: Judgment and thought content normal.  Seems depressed and anxious.    Labs reviewed: Lab Summary Latest Ref Rng 04/29/2015 11/29/2014 10/19/2014  Hemoglobin 11.1 - 15.9 g/dL (None) 11.7 11.9(L)  Hematocrit 34.0 -  46.6 % (None) 37.0 38.6  White count 3.4 - 10.8 x10E3/uL (None) 8.5 9.0  Platelet count 150 - 379 x10E3/uL (None) 412(H) 285  Sodium 136 - 144 mmol/L 141 142 138  Potassium 3.5 - 5.2 mmol/L 4.1 3.8 4.1  Calcium 8.7 - 10.3 mg/dL 9.1 9.5 9.1  Phosphorus - (None) (None) (None)  Creatinine 0.57 - 1.00 mg/dL 0.86 0.74 0.80  AST 0 - 40 IU/L '16 9 17  ' Alk Phos 39 - 117 IU/L 102 96 92  Bilirubin 0.0 - 1.2 mg/dL 0.4 0.4 0.7  Glucose 65 - 99 mg/dL 108(H) 202(H) 187(H)  Cholesterol - (None) (None) (None)  HDL cholesterol >39 mg/dL 35(L) 40 (None)  Triglycerides 0 - 149 mg/dL 204(H) 251(H) (None)  LDL Direct - (None) (None) (None)  LDL Calc 0 - 99 mg/dL 108(H) 113(H) (None)  Total protein 6.5 - 8.1 g/dL (None) (None) 7.0  Albumin 3.5 - 4.8 g/dL 3.4(L) 3.8 3.8   Lab Results  Component Value Date   TSH 1.930 11/29/2014   Lab Results  Component Value Date   BUN 14 04/29/2015   Lab Results  Component Value Date   HGBA1C 9.0* 04/29/2015    Assessment/Plan  1. Gastroesophageal reflux disease with esophagitis Continue pantoprazole  2. Essential hypertension, benign Controlled - Comprehensive metabolic panel; Future  3. Type 2 diabetes mellitus with both eyes affected by proliferative retinopathy and macular edema, with long-term current use  of insulin (HCC) Poor control. She just increased her insulin to 31 units twice daily. She was advised to increase further if glucose is not less than 150 mg percent on most occasions. - insulin regular (HUMULIN R) 100 units/mL injection; Inject 5 units ig CBG is > 200.  Dispense: 10 mL; Refill: 11 - Hemoglobin A1c; Future - Comprehensive metabolic panel; Future - Microalbumin, urine; Future  4. HLD (hyperlipidemia) Controlled - Lipid panel; Future  5. Hypothyroidism, unspecified hypothyroidism type Compensated - TSH; Future  6. Chronic pain syndrome Continue current medication

## 2015-07-29 DIAGNOSIS — H43811 Vitreous degeneration, right eye: Secondary | ICD-10-CM | POA: Diagnosis not present

## 2015-07-29 DIAGNOSIS — H35372 Puckering of macula, left eye: Secondary | ICD-10-CM | POA: Diagnosis not present

## 2015-07-29 DIAGNOSIS — H353131 Nonexudative age-related macular degeneration, bilateral, early dry stage: Secondary | ICD-10-CM | POA: Diagnosis not present

## 2015-07-29 DIAGNOSIS — E113393 Type 2 diabetes mellitus with moderate nonproliferative diabetic retinopathy without macular edema, bilateral: Secondary | ICD-10-CM | POA: Diagnosis not present

## 2015-08-05 ENCOUNTER — Other Ambulatory Visit: Payer: Medicare Other

## 2015-08-07 ENCOUNTER — Other Ambulatory Visit: Payer: Medicare Other

## 2015-08-07 ENCOUNTER — Ambulatory Visit: Payer: Medicare Other | Admitting: Internal Medicine

## 2015-08-07 DIAGNOSIS — E785 Hyperlipidemia, unspecified: Secondary | ICD-10-CM | POA: Diagnosis not present

## 2015-08-07 DIAGNOSIS — E039 Hypothyroidism, unspecified: Secondary | ICD-10-CM | POA: Diagnosis not present

## 2015-08-07 DIAGNOSIS — I10 Essential (primary) hypertension: Secondary | ICD-10-CM

## 2015-08-07 DIAGNOSIS — E113513 Type 2 diabetes mellitus with proliferative diabetic retinopathy with macular edema, bilateral: Secondary | ICD-10-CM

## 2015-08-07 DIAGNOSIS — Z794 Long term (current) use of insulin: Secondary | ICD-10-CM | POA: Diagnosis not present

## 2015-08-08 LAB — COMPREHENSIVE METABOLIC PANEL
A/G RATIO: 1.5 (ref 1.1–2.5)
ALK PHOS: 99 IU/L (ref 39–117)
ALT: 12 IU/L (ref 0–32)
AST: 13 IU/L (ref 0–40)
Albumin: 3.7 g/dL (ref 3.5–4.8)
BUN/Creatinine Ratio: 22 (ref 11–26)
BUN: 17 mg/dL (ref 8–27)
Bilirubin Total: 0.5 mg/dL (ref 0.0–1.2)
CO2: 24 mmol/L (ref 18–29)
Calcium: 8.8 mg/dL (ref 8.7–10.3)
Chloride: 102 mmol/L (ref 96–106)
Creatinine, Ser: 0.79 mg/dL (ref 0.57–1.00)
GFR calc Af Amer: 84 mL/min/{1.73_m2} (ref >59)
GFR, EST NON AFRICAN AMERICAN: 72 mL/min/{1.73_m2} (ref >59)
GLOBULIN, TOTAL: 2.5 g/dL (ref 1.5–4.5)
Glucose: 135 mg/dL — ABNORMAL HIGH (ref 65–99)
POTASSIUM: 4.2 mmol/L (ref 3.5–5.2)
SODIUM: 140 mmol/L (ref 134–144)
Total Protein: 6.2 g/dL (ref 6.0–8.5)

## 2015-08-08 LAB — LIPID PANEL
Chol/HDL Ratio: 4.8 ratio units — ABNORMAL HIGH (ref 0.0–4.4)
Cholesterol, Total: 215 mg/dL — ABNORMAL HIGH (ref 100–199)
HDL: 45 mg/dL (ref >39)
LDL CALC: 132 mg/dL — AB (ref 0–99)
Triglycerides: 191 mg/dL — ABNORMAL HIGH (ref 0–149)
VLDL CHOLESTEROL CAL: 38 mg/dL (ref 5–40)

## 2015-08-08 LAB — TSH: TSH: 2.85 u[IU]/mL (ref 0.450–4.500)

## 2015-08-08 LAB — HEMOGLOBIN A1C
ESTIMATED AVERAGE GLUCOSE: 203 mg/dL
Hgb A1c MFr Bld: 8.7 % — ABNORMAL HIGH (ref 4.8–5.6)

## 2015-08-08 LAB — MICROALBUMIN, URINE: Microalbumin, Urine: 384.5 ug/mL

## 2015-08-14 ENCOUNTER — Encounter: Payer: Self-pay | Admitting: Internal Medicine

## 2015-08-14 ENCOUNTER — Ambulatory Visit (INDEPENDENT_AMBULATORY_CARE_PROVIDER_SITE_OTHER): Payer: Medicare Other | Admitting: Internal Medicine

## 2015-08-14 VITALS — BP 138/82 | HR 56 | Temp 98.5°F | Ht 62.0 in | Wt 192.0 lb

## 2015-08-14 DIAGNOSIS — E113513 Type 2 diabetes mellitus with proliferative diabetic retinopathy with macular edema, bilateral: Secondary | ICD-10-CM | POA: Diagnosis not present

## 2015-08-14 DIAGNOSIS — E785 Hyperlipidemia, unspecified: Secondary | ICD-10-CM | POA: Diagnosis not present

## 2015-08-14 DIAGNOSIS — I10 Essential (primary) hypertension: Secondary | ICD-10-CM

## 2015-08-14 DIAGNOSIS — I25119 Atherosclerotic heart disease of native coronary artery with unspecified angina pectoris: Secondary | ICD-10-CM | POA: Diagnosis not present

## 2015-08-14 DIAGNOSIS — K2100 Gastro-esophageal reflux disease with esophagitis, without bleeding: Secondary | ICD-10-CM

## 2015-08-14 DIAGNOSIS — Z794 Long term (current) use of insulin: Secondary | ICD-10-CM

## 2015-08-14 DIAGNOSIS — E039 Hypothyroidism, unspecified: Secondary | ICD-10-CM | POA: Diagnosis not present

## 2015-08-14 MED ORDER — INSULIN REGULAR HUMAN 100 UNIT/ML IJ SOLN: INTRAMUSCULAR | Status: DC

## 2015-08-14 MED ORDER — OMEPRAZOLE 40 MG PO CPDR: DELAYED_RELEASE_CAPSULE | ORAL | Status: DC

## 2015-08-14 MED ORDER — INSULIN NPH (HUMAN) (ISOPHANE) 100 UNIT/ML ~~LOC~~ SUSP: SUBCUTANEOUS | Status: DC

## 2015-08-14 NOTE — Patient Instructions (Signed)
Use Glucerna if you are tempted to skip a meal. This may help avoid hypoglycemia.   Increase insulin gradually as needed to reach a point that you are less than 150 mg% on most occassions that you check your glucose.

## 2015-08-14 NOTE — Progress Notes (Signed)
Patient ID: [name], female   DOB: Nov 21, 1940, 75 y.o.   MRN: [id number]   Location:  [facility]  Provider: Jeanmarie Hubert, M.D.  Code Status: full Goals of Care:  Advanced Directives 08/14/2015  Does patient have an advance directive? Yes  Type of [employer] of [place];Living will  Copy of advanced directive(s) in chart? No - copy requested     Chief Complaint  Patient presents with  . Medical Management of Chronic Issues    blood sugar, blood pressure, thyroid, GERD. Here with husband    HPI: Patient is a 75 y.o. female seen today for medical management of chronic diseases.    Last office visit 05/01/2015. Patient was seen for GERD, hypertension, diabetes mellitus, hyperlipidemia, hypothyroidism, chronic pain syndrome.  Patient was still increasing insulin to try to get A glucose less than 150 mg percent on most occasions. Latest hemoglobin A1c was 8.7.  Having more indigestion. Protonix did not seem to help, so she stopped it. Now wants to resume omeprazole.  HTN controlled.  Having more chest pains. Using NTG. It helps immediately. Needs to see cardiologist, but she says she will not go back to see Dr. Wynonia Lawman.   Past Medical History  Diagnosis Date  . Hypertension   . Diabetes mellitus   . High cholesterol   . Other specified disease of sebaceous glands   . Other B-complex deficiencies   . Depressive disorder, not elsewhere classified   . Tension headache   . Migraine without aura, with intractable migraine, so stated, with status migrainosus   . Type I (juvenile type) diabetes mellitus without mention of complication, uncontrolled   . Unspecified vitamin D deficiency   . Dyskinesia of esophagus   . Memory loss   . Mild cognitive impairment, so stated   . Other nonspecific abnormal serum enzyme levels   . Lipoma of other skin and subcutaneous tissue   . Pain in joint, shoulder region   . Nonspecific (abnormal) findings on radiological  and other examination of abdominal area, including retroperitoneum   . Nonspecific abnormal results of liver function study   . Pain in joint, pelvic region and thigh   . Pain in joint, ankle and foot   . Edema   . Lumbago   . Other symptoms involving cardiovascular system   . Abdominal pain, unspecified site   . Other specified cardiac dysrhythmias(427.89)   . Ventral hernia, unspecified, without mention of obstruction or gangrene   . Extrinsic asthma, unspecified   . Dizziness and giddiness   . Palpitations   . Shortness of breath   . Reflux esophagitis   . Obesity, unspecified   . Unspecified hypothyroidism   . Type I (juvenile type) diabetes mellitus without mention of complication, not stated as uncontrolled   . Other and unspecified hyperlipidemia   . Gout, unspecified   . Unspecified essential hypertension   . Abnormality of gait   . Chest pain, unspecified   . Obstructive chronic bronchitis with exacerbation ([place])   . Female stress incontinence   . Apnea   . Cardiomegaly   . Complication of anesthesia     hard time waking up    Past Surgical History  Procedure Laterality Date  . Stents      last time she said was 1 yr ago   . Breast surgery    . Appendectomy    . Abdominal hysterectomy    . Laparoscopic gastric bypass  07/22/2004  PROCEDURE: Laparoscopic Roux-en-Y gastric bypass, antecolic, antegastric,  . Exploratory laparotomy w/ bowel resection  07/25/2004    PROCEDURE: Laparoscopy, open laparotomy, resection of jejunojejunostomy  . Incisional hernia repair  03/19/2006    PROCEDURE: Open ventral hernia repair with mesh.  . Laparoscopic incisional / umbilical / ventral hernia repair  09/22/2005    PROCEDURE: Laparoscopic ventral hernia repair with mesh.  . Left heart catheterization with coronary angiogram N/A 08/21/2014    Procedure: LEFT HEART CATHETERIZATION WITH CORONARY ANGIOGRAM;  Surgeon: Sherren Mocha, MD;  Location: [facility]: [facility];  Service:  Cardiovascular;  Laterality: N/A;    Allergies  Allergen Reactions  . Ciprofloxacin Rash  . Advair Diskus [Fluticasone-Salmeterol] Other (See Comments)    Throat closes  . Albuterol     REACTION: closes throat  . Alendronate Sodium Itching  . Benadryl [Diphenhydramine Hcl]   . Benzonatate     REACTION: rash/hives  . Cephalexin Nausea Only  . Gabapentin Other (See Comments)    Loss of memory  . Iohexol      Desc: rash and DIF BREATHING   . Keflex [Cephalexin]   . Morphine Nausea And Vomiting  . Other     Decongestants  . Propoxyphene Hcl   . Reclast [Zoledronic Acid] Other (See Comments)    Chest pain  . Avapro [Irbesartan] Rash  . Codeine Nausea And Vomiting, Swelling and Rash  . Tessalon Perles Rash  . Tramadol Nausea Only and Rash      Medication List       This list is accurate as of: 08/14/15 11:44 AM.  Always use your most recent med list.               amLODipine 2.5 MG tablet  Commonly known as:  NORVASC  Take 1 tablet (2.5 mg total) by mouth daily.     aspirin 81 MG tablet  Take 81 mg by mouth daily.     cetirizine 10 MG tablet  Commonly known as:  ZYRTEC  Take 10 mg by mouth daily as needed for allergies. For allergies     chlorpheniramine-HYDROcodone 10-8 MG/5ML Lqcr  Commonly known as:  TUSSIONEX  Take 5 mLs by mouth every 12 (twelve) hours. Take scheduled for 1 week and then as needed for cough.     citalopram 40 MG tablet  Commonly known as:  CELEXA  Take 1 tablet (40 mg total) by mouth daily. 1 by mouth daily for depression     docusate sodium 100 MG capsule  Commonly known as:  COLACE  Take 1 capsule (100 mg total) by mouth 2 (two) times daily.     glipiZIDE 10 MG 24 hr tablet  Commonly known as:  GLUCOTROL XL  Take one tablet by mouth in the morning and One tablet in the evening to control blood sugar     insulin NPH Human 100 UNIT/ML injection  Commonly known as:  HUMULIN N  Inject 31 units in the morning and 31 units in the  evening to control blood sugar     insulin regular 100 units/mL injection  Commonly known as:  HUMULIN R  Inject 5 units ig CBG is > 200.     losartan-hydrochlorothiazide 50-12.5 MG tablet  Commonly known as:  HYZAAR  Take one tablet by mouth once daily to control blood pressure     Magnesium Oxide 250 MG Tabs  Take 250 mg by mouth daily.     nitroGLYCERIN 0.4 MG SL tablet  Commonly known as:  NITROSTAT  Place 1 tablet (0.4 mg total) under the tongue every 5 (five) minutes as needed. For chest pain. Maximum of 3 tablets in 15 minutes.     pantoprazole 40 MG tablet  Commonly known as:  PROTONIX  Take 1 tablet (40 mg total) by mouth daily.     Vitamin D3 5000 units Caps  Take 1 tablet by mouth daily. For supplement        Review of Systems:  Review of Systems  Health Maintenance  Topic Date Due  . ZOSTAVAX  06/08/1998  . PNA vac Low Risk Adult (2 of 2 - PPSV23) 06/07/2014  . FOOT EXAM  08/23/2014  . OPHTHALMOLOGY EXAM  10/23/2015  . INFLUENZA VACCINE  12/31/2015  . HEMOGLOBIN A1C  02/07/2016  . TETANUS/TDAP  11/11/2021  . DEXA SCAN  Completed    Physical Exam: Filed Vitals:   08/14/15 1131  BP: 138/82  Pulse: 56  Temp: 98.5 F (36.9 C)  TempSrc: Oral  Height: 5\' 2"  (1.575 m)  Weight: 192 lb (87.091 kg)  SpO2: 95%   Body mass index is 35.11 kg/(m^2). Physical Exam  Labs reviewed: Basic Metabolic Panel:  Recent Labs  08/22/14 1911  11/29/14 1118 04/29/15 0940 08/07/15 0854  NA  --   < > 142 141 140  K  --   < > 3.8 4.1 4.2  CL  --   < > 99 101 102  CO2  --   < > 25 26 24   GLUCOSE  --   < > 202* 108* 135*  BUN  --   < > 13 14 17   CREATININE  --   < > 0.74 0.86 0.79  CALCIUM  --   < > 9.5 9.1 8.8  TSH 0.676  --  1.930  --  2.850  < > = values in this interval not displayed. Liver Function Tests:  Recent Labs  11/29/14 1118 04/29/15 0940 08/07/15 0854  AST 9 16 13   ALT 14 15 12   ALKPHOS 96 102 99  BILITOT 0.4 0.4 0.5  PROT 6.3 5.6* 6.2    ALBUMIN 3.8 3.4* 3.7    Recent Labs  08/19/14 0223  LIPASE 24   No results for input(s): AMMONIA in the last 8760 hours. CBC:  Recent Labs  08/19/14 0223  08/21/14 1901 08/23/14 0423 10/19/14 2048 11/29/14 1118  WBC 13.3*  < > 8.4 7.4 9.0 8.5  NEUTROABS 11.3*  --   --   --  6.4 5.8  HGB 13.0  < > 11.1* 10.2* 11.9*  --   HCT 41.6  < > 36.2 32.9* 38.6 37.0  MCV 84.2  < > 86.2 86.4 84.1 81  PLT 357  < > 282 278 285 412*  < > = values in this interval not displayed. Lipid Panel:  Recent Labs  11/29/14 1118 04/29/15 0940 08/07/15 0854  CHOL 203* 184 215*  HDL 40 35* 45  LDLCALC 113* 108* 132*  TRIG 251* 204* 191*  CHOLHDL 5.1* 5.3* 4.8*   Lab Results  Component Value Date   HGBA1C 8.7* 08/07/2015    Procedures since last visit: No results found.  Assessment/Plan 1. Type 2 diabetes mellitus with both eyes affected by proliferative retinopathy and macular edema, with long-term current use of insulin (HCC) Thanks I recommended to patient that she should check home blood sugars more often and should be more aggressive about increasing her NPH insulin. - insulin NPH Human (HUMULIN N) 100 UNIT/ML injection; Inject  33 units in the morning and 33 units in the evening to control blood sugar  Dispense: 30 mL; Refill: 3 - insulin regular (HUMULIN R) 100 units/mL injection; Inject 5 units ig CBG is > 200.  Dispense: 10 mL; Refill: 11 - Hemoglobin A1c; Future - Comprehensive metabolic panel; Future - Microalbumin, urine; Future  2. Hypothyroidism, unspecified hypothyroidism type Compensated - TSH; Future  3. Essential hypertension, benign Controlled - Comprehensive metabolic panel; Future  4. HLD (hyperlipidemia) Controlled - Lipid panel; Future  5. Gastroesophageal reflux disease with esophagitis - omeprazole (PRILOSEC) 40 MG capsule; One daily to suppress stomach acid  Dispense: 90 capsule; Refill: 3  6. Coronary artery disease involving native coronary artery  of native heart with angina pectoris [facility]) Because of increasing angina, believe that she needs to reestablish herself with a cardiologist. - Ambulatory referral to Cardiology

## 2015-08-15 ENCOUNTER — Telehealth: Payer: Self-pay

## 2015-08-15 NOTE — Telephone Encounter (Signed)
Received fax from [facility] that requested verification of instructions for Humulin R 100u/ml vial.   I left a message for the patient to call the office so that we could verify instructions given to her by provider.   Instructions in chart state: Inject 5 units if CBG is over 200.

## 2015-08-15 NOTE — Telephone Encounter (Signed)
Patient called back and verified the instructions that she was given. Those instructions were: Inject 5 units if CBG is over 200.   Completed verification form has been faxed back to [facility] at [phone].

## 2015-08-16 ENCOUNTER — Ambulatory Visit (INDEPENDENT_AMBULATORY_CARE_PROVIDER_SITE_OTHER): Payer: Medicare Other | Admitting: Cardiovascular Disease

## 2015-08-16 ENCOUNTER — Encounter: Payer: Self-pay | Admitting: Cardiovascular Disease

## 2015-08-16 VITALS — BP 126/62 | HR 80 | Ht 62.0 in | Wt 191.0 lb

## 2015-08-16 DIAGNOSIS — I25119 Atherosclerotic heart disease of native coronary artery with unspecified angina pectoris: Secondary | ICD-10-CM

## 2015-08-16 DIAGNOSIS — I2510 Atherosclerotic heart disease of native coronary artery without angina pectoris: Secondary | ICD-10-CM

## 2015-08-16 DIAGNOSIS — I429 Cardiomyopathy, unspecified: Secondary | ICD-10-CM | POA: Diagnosis not present

## 2015-08-16 NOTE — Progress Notes (Signed)
Cardiology Office Note Date:  08/17/2015   ID:  [name], DOB 03-11-1941, MRN [id number]  PCP:  [name], MD  Cardiologist:  Sherren Mocha, MD    Chief Complaint  Patient presents with  . Shortness of Breath    History of Present Illness: [name] is a 75 y.o. female who presents for evaluation of chest pain and shortness of breath. She has a hx of Takotsubo's Syndrome in 2016 with cardiac cath showing patent coronary arteries and a typical periapical wall motion abnormality. This occurred in the context of small bowel obstruction. The patient has not had cardiology follow-up since her hospitalization last year and presents today to establish care.  She continues to be under a lot of stress. Her husband has dementia and she has consumed with his care. She also has had multiple abdominal surgeries and has a large abdominal hernia. She states this contributes to chronic chest pain and acid reflux. She is limited by back and hip problems. She does continue to have episodic chest pain unrelated to physical exertion. Her complaints include shortness of breath with exertion, orthopnea, and heart palpitations. She uses nitroglycerin periodically for chest pain. Describes a pressure-like sensation over the left chest.   Past Medical History  Diagnosis Date  . Hypertension   . Diabetes mellitus   . High cholesterol   . Other specified disease of sebaceous glands   . Other B-complex deficiencies   . Depressive disorder, not elsewhere classified   . Tension headache   . Migraine without aura, with intractable migraine, so stated, with status migrainosus   . Type I (juvenile type) diabetes mellitus without mention of complication, uncontrolled   . Unspecified vitamin D deficiency   . Dyskinesia of esophagus   . Memory loss   . Mild cognitive impairment, so stated   . Other nonspecific abnormal serum enzyme levels   . Lipoma of other skin and subcutaneous tissue   . Pain in  joint, shoulder region   . Nonspecific (abnormal) findings on radiological and other examination of abdominal area, including retroperitoneum   . Nonspecific abnormal results of liver function study   . Pain in joint, pelvic region and thigh   . Pain in joint, ankle and foot   . Edema   . Lumbago   . Other symptoms involving cardiovascular system   . Abdominal pain, unspecified site   . Other specified cardiac dysrhythmias(427.89)   . Ventral hernia, unspecified, without mention of obstruction or gangrene   . Extrinsic asthma, unspecified   . Dizziness and giddiness   . Palpitations   . Shortness of breath   . Reflux esophagitis   . Obesity, unspecified   . Unspecified hypothyroidism   . Type I (juvenile type) diabetes mellitus without mention of complication, not stated as uncontrolled   . Other and unspecified hyperlipidemia   . Gout, unspecified   . Unspecified essential hypertension   . Abnormality of gait   . Chest pain, unspecified   . Obstructive chronic bronchitis with exacerbation ([place])   . Female stress incontinence   . Apnea   . Cardiomegaly   . Complication of anesthesia     hard time waking up    Past Surgical History  Procedure Laterality Date  . Stents      last time she said was 1 yr ago   . Breast surgery    . Appendectomy    . Abdominal hysterectomy    . Laparoscopic gastric  bypass  07/22/2004    PROCEDURE: Laparoscopic Roux-en-Y gastric bypass, antecolic, antegastric,  . Exploratory laparotomy w/ bowel resection  07/25/2004    PROCEDURE: Laparoscopy, open laparotomy, resection of jejunojejunostomy  . Incisional hernia repair  03/19/2006    PROCEDURE: Open ventral hernia repair with mesh.  . Laparoscopic incisional / umbilical / ventral hernia repair  09/22/2005    PROCEDURE: Laparoscopic ventral hernia repair with mesh.  . Left heart catheterization with coronary angiogram N/A 08/21/2014    Procedure: LEFT HEART CATHETERIZATION WITH CORONARY  ANGIOGRAM;  Surgeon: Sherren Mocha, MD;  Location: [facility] CATH LAB;  Service: Cardiovascular;  Laterality: N/A;    Current Outpatient Prescriptions  Medication Sig Dispense Refill  . amLODipine (NORVASC) 2.5 MG tablet Take 1 tablet (2.5 mg total) by mouth daily. 90 tablet 3  . aspirin 81 MG tablet Take 81 mg by mouth daily.     . cetirizine (ZYRTEC) 10 MG tablet Take 10 mg by mouth daily as needed for allergies. For allergies    . chlorpheniramine-HYDROcodone (TUSSIONEX) 10-8 MG/5ML LQCR Take 5 mLs by mouth every 12 (twelve) hours. Take scheduled for 1 week and then as needed for cough. 473 mL 0  . Cholecalciferol (VITAMIN D3) 5000 UNITS CAPS Take 1 tablet by mouth daily. For supplement    . citalopram (CELEXA) 40 MG tablet Take 1 tablet (40 mg total) by mouth daily. 1 by mouth daily for depression 90 tablet 1  . docusate sodium (COLACE) 100 MG capsule Take 1 capsule (100 mg total) by mouth 2 (two) times daily. 180 capsule 3  . glipiZIDE (GLUCOTROL XL) 10 MG 24 hr tablet Take one tablet by mouth in the morning and One tablet in the evening to control blood sugar 180 tablet 3  . insulin NPH Human (HUMULIN N) 100 UNIT/ML injection Inject 33 units in the morning and 33 units in the evening to control blood sugar 30 mL 3  . insulin regular (HUMULIN R) 100 units/mL injection Inject 5 units ig CBG is > 200. 10 mL 11  . losartan-hydrochlorothiazide (HYZAAR) 50-12.5 MG per tablet Take one tablet by mouth once daily to control blood pressure 90 tablet 3  . Magnesium Oxide 250 MG TABS Take 250 mg by mouth daily.     . nitroGLYCERIN (NITROSTAT) 0.4 MG SL tablet Place 1 tablet (0.4 mg total) under the tongue every 5 (five) minutes as needed. For chest pain. Maximum of 3 tablets in 15 minutes. 25 tablet 4  . omeprazole (PRILOSEC) 40 MG capsule One daily to suppress stomach acid 90 capsule 3   No current facility-administered medications for this visit.    Allergies:   Ciprofloxacin; Advair diskus; Albuterol;  Alendronate sodium; Benadryl; Benzonatate; Cephalexin; Gabapentin; Iohexol; Keflex; Morphine; Other; Propoxyphene hcl; Reclast; Avapro; Codeine; Tessalon perles; and Tramadol   Social History:  The patient  reports that she has never smoked. She has never used smokeless tobacco. She reports that she does not drink alcohol or use illicit drugs.   Family History:  The patient's  family history includes Cancer in her brother; Diabetes in her brother, father, and son; Heart disease in her father; Stroke in her father and mother.    ROS:  Please see the history of present illness.  Otherwise, review of systems is positive for depression, back pain, easy bruising, hearing loss, anxiety, balance problems.  All other systems are reviewed and negative.    PHYSICAL EXAM: VS:  BP 126/62 mmHg  Pulse 80  Ht 5\' 2"  (1.575  m)  Wt 191 lb (86.637 kg)  BMI 34.93 kg/m2 , BMI Body mass index is 34.93 kg/(m^2). GEN: Pleasant, obese woman, in no acute distress HEENT: normal Neck: no JVD, no masses. No carotid bruits Cardiac: RRR without murmur or gallop                Respiratory:  clear to auscultation bilaterally, normal work of breathing GI: soft, nontender, obese, upper abdominal hernia noted MS: no deformity or atrophy Ext: no pretibial edema, pedal pulses 2+= bilaterally Skin: warm and dry, no rash Neuro:  Strength and sensation are intact Psych: euthymic mood, full affect  EKG:  EKG is not ordered today.  Recent Labs: 08/19/2014: B Natriuretic Peptide 363.4* 10/19/2014: Hemoglobin 11.9* 11/29/2014: Platelets 412* 08/07/2015: ALT 12; BUN 17; Creatinine, Ser 0.79; Potassium 4.2; Sodium 140; TSH 2.850   Lipid Panel     Component Value Date/Time   CHOL 215* 08/07/2015 0854   CHOL 155 05/29/2014 0140   TRIG 191* 08/07/2015 0854   HDL 45 08/07/2015 0854   HDL 30* 05/29/2014 0140   CHOLHDL 4.8* 08/07/2015 0854   CHOLHDL 5.2 05/29/2014 0140   VLDL 39 05/29/2014 0140   LDLCALC 132* 08/07/2015 0854     LDLCALC 86 05/29/2014 0140      Wt Readings from Last 3 Encounters:  08/16/15 191 lb (86.637 kg)  08/14/15 192 lb (87.091 kg)  05/01/15 194 lb (87.998 kg)     Cardiac Studies Reviewed: Cardiac Cath 3.22.2016: Procedure: Left Heart Cath, Selective Coronary Angiography, LV angiography  Indication: NSTEMI  Procedural Details: The right wrist was prepped, draped, and anesthetized with 1% lidocaine. Using the modified Seldinger technique, a 5/6 French Slender sheath was introduced into the right radial artery. 3 mg of verapamil was administered through the sheath, weight-based unfractionated heparin was administered intravenously. Standard Judkins catheters were used for selective coronary angiography and left ventriculography. Catheter exchanges were performed over an exchange length guidewire. There were no immediate procedural complications. A TR band was used for radial hemostasis at the completion of the procedure. The patient was transferred to the post catheterization recovery area for further monitoring.  Procedural Findings: Hemodynamics: AO 107/54 LV 106/7  Coronary angiography: Coronary dominance: right  Left mainstem: The left mainstem is patent. There is minimal distal left main narrowing 20%. There is mild calcification present.  Left anterior descending (LAD): The LAD is patent to the apex. There is mild diffuse proximal vessel calcification. There are mild irregularities with about 20% stenosis. The diagonal branches are patent without significant stenosis.  Left circumflex (LCx): The left circumflex is codominant with the right coronary artery. The ramus intermedius branch has mild 20-30% stenosis in its midportion. The AV circumflex is widely patent. The left PLA branch is widely patent.  Right coronary artery (RCA): The RCA is dominant. The PDA branch is large. Proximal RCA is widely patent. The mid RCA has diffuse 30% stenosis. The  distal RCA is patent.  Left ventriculography: The LVEF is estimated at 40-45%. There is hypokinesis of the distal anterior wall and apex. There is severe hypokinesis of the inferoapex.  Estimated Blood Loss: minimal  Final Conclusions:  1. Mild nonobstructive CAD 2. Mild segmental LV systolic dysfunction with periapical wall motion abnormality and hyperdynamic motion at the base of the heart 3. Normal LVEDP  Recommendations: Supportive medical therapy. Suspect Takotsubo's Cardiomyopathy.  ASSESSMENT AND PLAN: 1.  Takotsubo Syndrome: We discussed the typical recovery from this syndrome. The patient has some ongoing risk because  of her stressors. Most patients have a good prognosis with full recovery and I have recommended a repeat echocardiogram to evaluate for normalization of her LV function. She will otherwise continue on her current medical program without changes.  2. Essential hypertension: Blood pressure is controlled on a combination of amlodipine and losartan/HCTZ  3. Type 2 diabetes: Managed by Dr. Nyoka Cowden  Current medicines are reviewed with the patient today.  The patient does not have concerns regarding medicines.  Labs/ tests ordered today include:   Orders Placed This Encounter  Procedures  . Echocardiogram    Disposition:   FU 6 months  Signed, Sherren Mocha, MD  08/17/2015 12:21 AM    [facility] [place], [place], [place]  [address] Phone: [phone]; Fax: [phone]

## 2015-08-16 NOTE — Patient Instructions (Signed)
Medication Instructions:  Your physician recommends that you continue on your current medications as directed. Please refer to the Current Medication list given to you today.  Labwork: No new orders.   Testing/Procedures: Your physician has requested that you have an echocardiogram. Echocardiography is a painless test that uses sound waves to create images of your heart. It provides your doctor with information about the size and shape of your heart and how well your heart's chambers and valves are working. This procedure takes approximately one hour. There are no restrictions for this procedure.  Follow-Up: Your physician wants you to follow-up in: 6 MONTHS with Dr Cooper.  You will receive a reminder letter in the mail two months in advance. If you don't receive a letter, please call our office to schedule the follow-up appointment.   Any Other Special Instructions Will Be Listed Below (If Applicable).     If you need a refill on your cardiac medications before your next appointment, please call your pharmacy.   

## 2015-08-27 ENCOUNTER — Ambulatory Visit: Payer: Federal, State, Local not specified - PPO | Admitting: Cardiology

## 2015-09-03 ENCOUNTER — Ambulatory Visit (HOSPITAL_COMMUNITY): Payer: Medicare Other | Attending: Cardiology

## 2015-09-03 ENCOUNTER — Other Ambulatory Visit: Payer: Self-pay

## 2015-09-03 DIAGNOSIS — I2510 Atherosclerotic heart disease of native coronary artery without angina pectoris: Secondary | ICD-10-CM

## 2015-09-03 DIAGNOSIS — I059 Rheumatic mitral valve disease, unspecified: Secondary | ICD-10-CM | POA: Insufficient documentation

## 2015-09-03 DIAGNOSIS — E669 Obesity, unspecified: Secondary | ICD-10-CM | POA: Insufficient documentation

## 2015-09-03 DIAGNOSIS — E119 Type 2 diabetes mellitus without complications: Secondary | ICD-10-CM | POA: Diagnosis not present

## 2015-09-03 DIAGNOSIS — I313 Pericardial effusion (noninflammatory): Secondary | ICD-10-CM | POA: Diagnosis not present

## 2015-09-03 DIAGNOSIS — I119 Hypertensive heart disease without heart failure: Secondary | ICD-10-CM | POA: Insufficient documentation

## 2015-09-03 DIAGNOSIS — E785 Hyperlipidemia, unspecified: Secondary | ICD-10-CM | POA: Diagnosis not present

## 2015-09-03 DIAGNOSIS — Z6834 Body mass index (BMI) 34.0-34.9, adult: Secondary | ICD-10-CM | POA: Diagnosis not present

## 2015-09-03 DIAGNOSIS — I429 Cardiomyopathy, unspecified: Secondary | ICD-10-CM | POA: Diagnosis not present

## 2015-09-05 DIAGNOSIS — L821 Other seborrheic keratosis: Secondary | ICD-10-CM | POA: Diagnosis not present

## 2015-09-05 DIAGNOSIS — D225 Melanocytic nevi of trunk: Secondary | ICD-10-CM | POA: Diagnosis not present

## 2015-09-05 DIAGNOSIS — L570 Actinic keratosis: Secondary | ICD-10-CM | POA: Diagnosis not present

## 2015-09-05 DIAGNOSIS — D1801 Hemangioma of skin and subcutaneous tissue: Secondary | ICD-10-CM | POA: Diagnosis not present

## 2015-09-05 DIAGNOSIS — L814 Other melanin hyperpigmentation: Secondary | ICD-10-CM | POA: Diagnosis not present

## 2015-09-05 DIAGNOSIS — L820 Inflamed seborrheic keratosis: Secondary | ICD-10-CM | POA: Diagnosis not present

## 2015-09-05 DIAGNOSIS — L304 Erythema intertrigo: Secondary | ICD-10-CM | POA: Diagnosis not present

## 2015-09-16 ENCOUNTER — Telehealth: Payer: Self-pay | Admitting: Cardiovascular Disease

## 2015-09-16 NOTE — Telephone Encounter (Signed)
Pt calling to get echo results from last month-pls call [phone]

## 2015-09-16 NOTE — Telephone Encounter (Signed)
I spoke with the pt and made her aware of echo results. EF in normal and has improved.

## 2015-10-04 ENCOUNTER — Other Ambulatory Visit: Payer: Self-pay | Admitting: Internal Medicine

## 2015-11-03 ENCOUNTER — Other Ambulatory Visit: Payer: Self-pay | Admitting: Internal Medicine

## 2015-12-10 ENCOUNTER — Other Ambulatory Visit: Payer: Self-pay | Admitting: *Deleted

## 2015-12-10 DIAGNOSIS — E785 Hyperlipidemia, unspecified: Secondary | ICD-10-CM

## 2015-12-10 DIAGNOSIS — I10 Essential (primary) hypertension: Secondary | ICD-10-CM

## 2015-12-10 DIAGNOSIS — E039 Hypothyroidism, unspecified: Secondary | ICD-10-CM

## 2015-12-10 DIAGNOSIS — E113513 Type 2 diabetes mellitus with proliferative diabetic retinopathy with macular edema, bilateral: Secondary | ICD-10-CM

## 2015-12-10 DIAGNOSIS — Z794 Long term (current) use of insulin: Principal | ICD-10-CM

## 2015-12-16 ENCOUNTER — Other Ambulatory Visit: Payer: Medicare Other

## 2015-12-18 ENCOUNTER — Ambulatory Visit: Payer: Medicare Other | Admitting: Internal Medicine

## 2015-12-19 ENCOUNTER — Encounter: Payer: Self-pay | Admitting: Internal Medicine

## 2016-01-20 ENCOUNTER — Other Ambulatory Visit: Payer: Medicare Other

## 2016-01-20 DIAGNOSIS — E039 Hypothyroidism, unspecified: Secondary | ICD-10-CM | POA: Diagnosis not present

## 2016-01-20 DIAGNOSIS — E785 Hyperlipidemia, unspecified: Secondary | ICD-10-CM | POA: Diagnosis not present

## 2016-01-20 DIAGNOSIS — Z794 Long term (current) use of insulin: Secondary | ICD-10-CM

## 2016-01-20 DIAGNOSIS — E113513 Type 2 diabetes mellitus with proliferative diabetic retinopathy with macular edema, bilateral: Secondary | ICD-10-CM

## 2016-01-20 DIAGNOSIS — I10 Essential (primary) hypertension: Secondary | ICD-10-CM

## 2016-01-20 LAB — LIPID PANEL
CHOL/HDL RATIO: 4.6 ratio (ref <=5.0)
Cholesterol: 198 mg/dL (ref 125–200)
HDL: 43 mg/dL — AB (ref >=46)
LDL Cholesterol: 118 mg/dL (ref <130)
Triglycerides: 183 mg/dL — ABNORMAL HIGH (ref <150)
VLDL: 37 mg/dL — AB (ref <30)

## 2016-01-20 LAB — COMPLETE METABOLIC PANEL WITH GFR
ALT: 14 U/L (ref 6–29)
AST: 16 U/L (ref 10–35)
Albumin: 3.7 g/dL (ref 3.6–5.1)
Alkaline Phosphatase: 80 U/L (ref 33–130)
BUN: 21 mg/dL (ref 7–25)
CO2: 29 mmol/L (ref 20–31)
Calcium: 9.2 mg/dL (ref 8.6–10.4)
Chloride: 102 mmol/L (ref 98–110)
Creat: 0.96 mg/dL — ABNORMAL HIGH (ref 0.60–0.93)
GFR, Est African American: 66 mL/min (ref >=60)
GFR, Est Non African American: 57 mL/min — ABNORMAL LOW (ref >=60)
Glucose, Bld: 149 mg/dL — ABNORMAL HIGH (ref 65–99)
Potassium: 4.4 mmol/L (ref 3.5–5.3)
Sodium: 140 mmol/L (ref 135–146)
Total Bilirubin: 0.6 mg/dL (ref 0.2–1.2)
Total Protein: 6.2 g/dL (ref 6.1–8.1)

## 2016-01-20 LAB — TSH: TSH: 1.9 mIU/L

## 2016-01-21 LAB — HEMOGLOBIN A1C
Hgb A1c MFr Bld: 7.8 % — ABNORMAL HIGH (ref <5.7)
Mean Plasma Glucose: 177 mg/dL

## 2016-01-21 LAB — MICROALBUMIN, URINE: Microalb, Ur: 17.6 mg/dL

## 2016-01-22 ENCOUNTER — Ambulatory Visit (INDEPENDENT_AMBULATORY_CARE_PROVIDER_SITE_OTHER): Payer: Medicare Other | Admitting: Internal Medicine

## 2016-01-22 ENCOUNTER — Encounter: Payer: Self-pay | Admitting: Internal Medicine

## 2016-01-22 VITALS — BP 148/72 | HR 59 | Temp 97.9°F | Ht 62.0 in | Wt 192.0 lb

## 2016-01-22 DIAGNOSIS — I25119 Atherosclerotic heart disease of native coronary artery with unspecified angina pectoris: Secondary | ICD-10-CM

## 2016-01-22 DIAGNOSIS — E669 Obesity, unspecified: Secondary | ICD-10-CM | POA: Diagnosis not present

## 2016-01-22 DIAGNOSIS — E039 Hypothyroidism, unspecified: Secondary | ICD-10-CM

## 2016-01-22 DIAGNOSIS — Z794 Long term (current) use of insulin: Secondary | ICD-10-CM

## 2016-01-22 DIAGNOSIS — Z630 Problems in relationship with spouse or partner: Secondary | ICD-10-CM | POA: Insufficient documentation

## 2016-01-22 DIAGNOSIS — I10 Essential (primary) hypertension: Secondary | ICD-10-CM

## 2016-01-22 DIAGNOSIS — G894 Chronic pain syndrome: Secondary | ICD-10-CM | POA: Diagnosis not present

## 2016-01-22 DIAGNOSIS — E785 Hyperlipidemia, unspecified: Secondary | ICD-10-CM | POA: Diagnosis not present

## 2016-01-22 DIAGNOSIS — F329 Major depressive disorder, single episode, unspecified: Secondary | ICD-10-CM | POA: Diagnosis not present

## 2016-01-22 DIAGNOSIS — E113513 Type 2 diabetes mellitus with proliferative diabetic retinopathy with macular edema, bilateral: Secondary | ICD-10-CM

## 2016-01-22 DIAGNOSIS — F32A Depression, unspecified: Secondary | ICD-10-CM
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
## 2016-02-24 ENCOUNTER — Ambulatory Visit (INDEPENDENT_AMBULATORY_CARE_PROVIDER_SITE_OTHER): Payer: Medicare Other | Admitting: Cardiovascular Disease

## 2016-02-24 ENCOUNTER — Encounter (INDEPENDENT_AMBULATORY_CARE_PROVIDER_SITE_OTHER): Payer: Self-pay

## 2016-02-24 ENCOUNTER — Ambulatory Visit
Admission: RE | Admit: 2016-02-24 | Discharge: 2016-02-24 | Disposition: A | Discharge status: Home / Self Care | Payer: Medicare Other | Source: Ambulatory Visit | Attending: Cardiovascular Disease | Admitting: Cardiovascular Disease

## 2016-02-24 VITALS — BP 118/60 | HR 80 | Ht 62.0 in | Wt 194.0 lb

## 2016-02-24 DIAGNOSIS — I25119 Atherosclerotic heart disease of native coronary artery with unspecified angina pectoris: Secondary | ICD-10-CM | POA: Diagnosis not present

## 2016-02-24 DIAGNOSIS — R059 Cough, unspecified: Secondary | ICD-10-CM

## 2016-02-24 DIAGNOSIS — R05 Cough: Secondary | ICD-10-CM | POA: Diagnosis not present

## 2016-02-24 DIAGNOSIS — R0602 Shortness of breath: Secondary | ICD-10-CM | POA: Diagnosis not present

## 2016-02-24 DIAGNOSIS — I10 Essential (primary) hypertension: Secondary | ICD-10-CM | POA: Diagnosis not present

## 2016-02-24 LAB — CBC
HCT: 36.8 % (ref 35.0–45.0)
Hemoglobin: 11.3 g/dL — ABNORMAL LOW (ref 11.7–15.5)
MCH: 25.5 pg — ABNORMAL LOW (ref 27.0–33.0)
MCHC: 30.7 g/dL — ABNORMAL LOW (ref 32.0–36.0)
MCV: 83.1 fL (ref 80.0–100.0)
MPV: 11.4 fL (ref 7.5–12.5)
PLATELETS: 369 10*3/uL (ref 140–400)
RBC: 4.43 MIL/uL (ref 3.80–5.10)
RDW: 14.6 % (ref 11.0–15.0)
WBC: 8.3 10*3/uL (ref 3.8–10.8)

## 2016-02-24 LAB — BRAIN NATRIURETIC PEPTIDE: BRAIN NATRIURETIC PEPTIDE: 119.3 pg/mL — AB (ref <100)

## 2016-02-24 LAB — BASIC METABOLIC PANEL
BUN: 16 mg/dL (ref 7–25)
CALCIUM: 8.9 mg/dL (ref 8.6–10.4)
CO2: 27 mmol/L (ref 20–31)
CREATININE: 0.93 mg/dL (ref 0.60–0.93)
Chloride: 100 mmol/L (ref 98–110)
Glucose, Bld: 286 mg/dL — ABNORMAL HIGH (ref 65–99)
Potassium: 3.8 mmol/L (ref 3.5–5.3)
Sodium: 138 mmol/L (ref 135–146)

## 2016-02-24 NOTE — Patient Instructions (Signed)
Medication Instructions:  Your physician recommends that you continue on your current medications as directed. Please refer to the Current Medication list given to you today.  Labwork: Your physician recommends that you have lab work today: BMP, BNP and CBC  Testing/Procedures: A chest x-ray takes a picture of the organs and structures inside the chest, including the heart, lungs, and blood vessels. This test can show several things, including, whether the heart is enlarges; whether fluid is building up in the lungs; and whether pacemaker / defibrillator leads are still in place. ([place], 1st floor)  Follow-Up: Your physician wants you to follow-up in: 6 MONTHS with Dr Burt Knack.  You will receive a reminder letter in the mail two months in advance. If you don't receive a letter, please call our office to schedule the follow-up appointment.   Any Other Special Instructions Will Be Listed Below (If Applicable).     If you need a refill on your cardiac medications before your next appointment, please call your pharmacy.

## 2016-02-24 NOTE — Progress Notes (Signed)
Cardiology Office Note Date:  02/24/2016   ID:  [name], DOB Oct 27, 1940, MRN [id number]  PCP:  Jeanmarie Hubert, MD  Cardiologist:  Sherren Mocha, MD    Chief Complaint  Patient presents with  . Coronary Artery Disease     History of Present Illness: [name] is a 75 y.o. female who presents for follow-up evaluation.  She has a hx of Takotsubo's Syndrome in 2016 with cardiac cath showing patent coronary arteries and a typical periapical wall motion abnormality. This occurred in the context of small bowel obstruction.  Has been under a great deal of continued stress with her husband's chronic illness. Woke up at 3am today with chest pain and shortness of breath. Resolved after about 5 minutes of sitting up. States she hasn't been feeling well for about one week, complains of 'wheezing.' No orthopnea. No edema. No other recent episodes of chest pain.   Past Medical History:  Diagnosis Date  . Abdominal pain, unspecified site   . Abnormality of gait   . Apnea   . Cardiomegaly   . Chest pain, unspecified   . Complication of anesthesia    hard time waking up  . Depressive disorder, not elsewhere classified   . Diabetes mellitus   . Dizziness and giddiness   . Dyskinesia of esophagus   . Edema   . Extrinsic asthma, unspecified   . Female stress incontinence   . Gout, unspecified   . High cholesterol   . Hypertension   . Lipoma of other skin and subcutaneous tissue   . Lumbago   . Memory loss   . Migraine without aura, with intractable migraine, so stated, with status migrainosus   . Mild cognitive impairment, so stated   . Nonspecific (abnormal) findings on radiological and other examination of abdominal area, including retroperitoneum   . Nonspecific abnormal results of liver function study   . Obesity, unspecified   . Obstructive chronic bronchitis with exacerbation ([place])   . Other and unspecified hyperlipidemia   . Other B-complex deficiencies   . Other  nonspecific abnormal serum enzyme levels   . Other specified cardiac dysrhythmias(427.89)   . Other specified disease of sebaceous glands   . Other symptoms involving cardiovascular system   . Pain in joint, ankle and foot   . Pain in joint, pelvic region and thigh   . Pain in joint, shoulder region   . Palpitations   . Reflux esophagitis   . Shortness of breath   . Tension headache   . Type I (juvenile type) diabetes mellitus without mention of complication, not stated as uncontrolled   . Type I (juvenile type) diabetes mellitus without mention of complication, uncontrolled   . Unspecified essential hypertension   . Unspecified hypothyroidism   . Unspecified vitamin D deficiency   . Ventral hernia, unspecified, without mention of obstruction or gangrene     Past Surgical History:  Procedure Laterality Date  . ABDOMINAL HYSTERECTOMY    . APPENDECTOMY    . BREAST SURGERY    . EXPLORATORY LAPAROTOMY W/ BOWEL RESECTION  07/25/2004   PROCEDURE: Laparoscopy, open laparotomy, resection of jejunojejunostomy  . INCISIONAL HERNIA REPAIR  03/19/2006   PROCEDURE: Open ventral hernia repair with mesh.  Marland Kitchen LAPAROSCOPIC GASTRIC BYPASS  07/22/2004   PROCEDURE: Laparoscopic Roux-en-Y gastric bypass, antecolic, antegastric,  . LAPAROSCOPIC INCISIONAL / UMBILICAL / VENTRAL HERNIA REPAIR  09/22/2005   PROCEDURE: Laparoscopic ventral hernia repair with mesh.  Marland Kitchen LEFT HEART CATHETERIZATION WITH CORONARY  ANGIOGRAM N/A 08/21/2014   Procedure: LEFT HEART CATHETERIZATION WITH CORONARY ANGIOGRAM;  Surgeon: Sherren Mocha, MD;  Location: [facility] CATH LAB;  Service: Cardiovascular;  Laterality: N/A;  . stents     last time she said was 1 yr ago     Current Outpatient Prescriptions  Medication Sig Dispense Refill  . amLODipine (NORVASC) 2.5 MG tablet TAKE 1 TABLET (2.5 MG TOTAL) BY MOUTH DAILY. 90 tablet 3  . aspirin 81 MG tablet Take 81 mg by mouth daily.     . cetirizine (ZYRTEC) 10 MG tablet Take 10 mg by  mouth daily as needed for allergies. For allergies    . chlorpheniramine-HYDROcodone (TUSSIONEX) 10-8 MG/5ML LQCR Take 5 mLs by mouth every 12 (twelve) hours. Take scheduled for 1 week and then as needed for cough. 473 mL 0  . Cholecalciferol (VITAMIN D3) 5000 UNITS CAPS Take 1 tablet by mouth daily. For supplement    . citalopram (CELEXA) 40 MG tablet TAKE 1 TABLET (40 MG TOTAL) BY MOUTH DAILY. 1 BY MOUTH DAILY FOR DEPRESSION 90 tablet 1  . docusate sodium (COLACE) 100 MG capsule Take 1 capsule (100 mg total) by mouth 2 (two) times daily. 180 capsule 3  . glipiZIDE (GLUCOTROL XL) 10 MG 24 hr tablet Take one tablet by mouth in the morning and One tablet in the evening to control blood sugar 180 tablet 3  . insulin NPH Human (HUMULIN N) 100 UNIT/ML injection Inject 33 units in the morning and 33 units in the evening to control blood sugar 30 mL 3  . insulin regular (NOVOLIN R,HUMULIN R) 100 units/mL injection Inject 5 Units into the skin as directed. If blood glucose is above 200 mg/dL    . losartan-hydrochlorothiazide (HYZAAR) 50-12.5 MG per tablet Take one tablet by mouth once daily to control blood pressure 90 tablet 3  . Magnesium Oxide 250 MG TABS Take 250 mg by mouth daily.     . nitroGLYCERIN (NITROSTAT) 0.4 MG SL tablet Place 1 tablet (0.4 mg total) under the tongue every 5 (five) minutes as needed. For chest pain. Maximum of 3 tablets in 15 minutes. 25 tablet 4  . omeprazole (PRILOSEC) 40 MG capsule Take 40 mg by mouth daily.     No current facility-administered medications for this visit.     Allergies:   Ciprofloxacin; Advair diskus [fluticasone-salmeterol]; Albuterol; Alendronate sodium; Benadryl [diphenhydramine hcl]; Benzonatate; Cephalexin; Gabapentin; Iohexol; Keflex [cephalexin]; Morphine; Other; Propoxyphene hcl; Reclast [zoledronic acid]; Avapro [irbesartan]; Codeine; Tessalon perles; and Tramadol   Social History:  The patient  reports that she has never smoked. She has never  used smokeless tobacco. She reports that she does not drink alcohol or use drugs.   Family History:  The patient's  family history includes Cancer in her brother; Diabetes in her brother, father, and son; Heart disease in her father; Stroke in her father and mother.   ROS:  Please see the history of present illness.  All other systems are reviewed and negative.   PHYSICAL EXAM: VS:  BP 118/60   Pulse 80   Ht 5\' 2"  (1.575 m)   Wt 194 lb (88 kg)   BMI 35.48 kg/m  , BMI Body mass index is 35.48 kg/m. GEN: Well nourished, well developed, in no acute distress  HEENT: normal  Neck: no JVD, no masses. No carotid bruits Cardiac: RRR without murmur or gallop                Respiratory:  clear to auscultation  bilaterally, diffuse rhonchi GI: soft, nontender, nondistended, + BS MS: no deformity or atrophy  Ext: no pretibial edema, pedal pulses 2+= bilaterally Skin: warm and dry, no rash Neuro:  Strength and sensation are intact Psych: euthymic mood, full affect  EKG:  EKG is ordered today. The ekg ordered today shows normal sinus rhythm 81 bpm, nonspecific ST abnormality  Recent Labs: 01/20/2016: ALT 14; BUN 21; Creat 0.96; Potassium 4.4; Sodium 140; TSH 1.90   Lipid Panel     Component Value Date/Time   CHOL 198 01/20/2016 1044   CHOL 215 (H) 08/07/2015 0854   TRIG 183 (H) 01/20/2016 1044   HDL 43 (L) 01/20/2016 1044   HDL 45 08/07/2015 0854   CHOLHDL 4.6 01/20/2016 1044   VLDL 37 (H) 01/20/2016 1044   LDLCALC 118 01/20/2016 1044   LDLCALC 132 (H) 08/07/2015 0854      Wt Readings from Last 3 Encounters:  02/24/16 194 lb (88 kg)  01/22/16 192 lb (87.1 kg)  08/16/15 191 lb (86.6 kg)     Cardiac Studies Reviewed: Cardiac Cath 3.22.2016: Procedure: Left Heart Cath, Selective Coronary Angiography, LV angiography  Indication: NSTEMI  Procedural Details: The right wrist was prepped, draped, and anesthetized with 1% lidocaine. Using the  modified Seldinger technique, a 5/6 French Slender sheath was introduced into the right radial artery. 3 mg of verapamil was administered through the sheath, weight-based unfractionated heparin was administered intravenously. Standard Judkins catheters were used for selective coronary angiography and left ventriculography. Catheter exchanges were performed over an exchange length guidewire. There were no immediate procedural complications. A TR band was used for radial hemostasis at the completion of the procedure. The patient was transferred to the post catheterization recovery area for further monitoring.  Procedural Findings: Hemodynamics: AO 107/54 LV 106/7  Coronary angiography: Coronary dominance: right  Left mainstem: The left mainstem is patent. There is minimal distal left main narrowing 20%. There is mild calcification present.  Left anterior descending (LAD): The LAD is patent to the apex. There is mild diffuse proximal vessel calcification. There are mild irregularities with about 20% stenosis. The diagonal branches are patent without significant stenosis.  Left circumflex (LCx): The left circumflex is codominant with the right coronary artery. The ramus intermedius branch has mild 20-30% stenosis in its midportion. The AV circumflex is widely patent. The left PLA branch is widely patent.  Right coronary artery (RCA): The RCA is dominant. The PDA branch is large. Proximal RCA is widely patent. The mid RCA has diffuse 30% stenosis. The distal RCA is patent.  Left ventriculography: The LVEF is estimated at 40-45%. There is hypokinesis of the distal anterior wall and apex. There is severe hypokinesis of the inferoapex.  Estimated Blood Loss: minimal  Final Conclusions:  1. Mild nonobstructive CAD 2. Mild segmental LV systolic dysfunction with periapical wall motion abnormality and hyperdynamic motion at the base of the heart 3. Normal LVEDP  Recommendations: Supportive  medical therapy. Suspect Takotsubo's Cardiomyopathy.  Echo 09-03-2015: Study Conclusions  - Left ventricle: The cavity size was normal. There was mild   concentric hypertrophy. Systolic function was vigorous. The   estimated ejection fraction was in the range of 65% to 70%. Wall   motion was normal; there were no regional wall motion   abnormalities. Doppler parameters are consistent with abnormal   left ventricular relaxation (grade 1 diastolic dysfunction). - Mitral valve: Calcified annulus. Mildly thickened leaflets . - Left atrium: The atrium was mildly dilated. - Pericardium, extracardiac: A trivial  pericardial effusion was   identified.  Impressions:  - EF has improved when compared to prior (35%).  ASSESSMENT AND PLAN: 1.  Takotsubo Cardiomyopathy: overall appears stable with improvement in LV function on most recent echo study. Still under a lot of stress and will be at risk of recurrence. Discussed stress management issues.   2. Essential HTN: BP well-controlled on current Rx. Medications reviewed.   3. Type II DM: treated with insulin and glipizide. On losartan.   4. Shortness of breath: suspect related to bronchitis/asthma based on her exam. Will check a CXR and lab work to include BNP. Advised to contact PCP for evaluation.   Current medicines are reviewed with the patient today.  The patient does not have concerns regarding medicines.  Labs/ tests ordered today include:  No orders of the defined types were placed in this encounter.   Disposition:   FU 6 months  Signed, Sherren Mocha, MD  02/24/2016 9:25 AM    [facility], [place], [place]  [address] Phone: [phone]; Fax: [phone]

## 2016-02-25 ENCOUNTER — Encounter: Payer: Self-pay | Admitting: Nurse Practitioner

## 2016-02-25 ENCOUNTER — Ambulatory Visit (INDEPENDENT_AMBULATORY_CARE_PROVIDER_SITE_OTHER): Payer: Medicare Other | Admitting: Nurse Practitioner

## 2016-02-25 ENCOUNTER — Encounter (HOSPITAL_COMMUNITY): Payer: Self-pay

## 2016-02-25 ENCOUNTER — Ambulatory Visit (HOSPITAL_COMMUNITY)
Admission: RE | Admit: 2016-02-25 | Discharge: 2016-02-25 | Disposition: A | Discharge status: Home / Self Care | Payer: Medicare Other | Source: Ambulatory Visit | Attending: Nurse Practitioner | Admitting: Nurse Practitioner

## 2016-02-25 ENCOUNTER — Ambulatory Visit (HOSPITAL_COMMUNITY): Admission: RE | Admit: 2016-02-25 | Payer: Medicare Other | Source: Ambulatory Visit

## 2016-02-25 VITALS — BP 112/80 | HR 91 | Temp 98.0°F | Ht 62.0 in | Wt 193.4 lb

## 2016-02-25 DIAGNOSIS — I25119 Atherosclerotic heart disease of native coronary artery with unspecified angina pectoris: Secondary | ICD-10-CM | POA: Diagnosis not present

## 2016-02-25 DIAGNOSIS — R0602 Shortness of breath: Secondary | ICD-10-CM | POA: Diagnosis not present

## 2016-02-25 DIAGNOSIS — R079 Chest pain, unspecified: Secondary | ICD-10-CM | POA: Diagnosis not present

## 2016-02-25 MED ORDER — PREDNISONE 10 MG PO TABS: ORAL_TABLET | ORAL | 0 refills | Status: DC

## 2016-02-25 MED ORDER — METHYLPREDNISOLONE ACETATE 40 MG/ML IJ SUSP
80.0000 mg | Freq: Once | INTRAMUSCULAR | Status: AC
  Administered 2016-02-25: 80 mg via INTRAMUSCULAR

## 2016-02-25 MED ORDER — PREDNISONE 10 MG PO TABS: 10.0000 mg | ORAL_TABLET | Freq: Every day | ORAL | 0 refills | Status: DC

## 2016-02-25 MED ORDER — TECHNETIUM TO 99M ALBUMIN AGGREGATED
4.2000 | Freq: Once | INTRAVENOUS | Status: AC | PRN
  Administered 2016-02-25: 4 via INTRAVENOUS

## 2016-02-25 MED ORDER — TECHNETIUM TC 99M DIETHYLENETRIAME-PENTAACETIC ACID: 31.2000 | Freq: Once | INTRAVENOUS | Status: DC | PRN

## 2016-02-25 NOTE — Progress Notes (Signed)
Careteam: Patient Care Team: [name], MD as PCP - General (Internal Medicine) Excell Seltzer, MD as Consulting Physician (General Surgery) Tanda Rockers, MD as Consulting Physician (Pulmonary Disease) Ronald Lobo, MD as Consulting Physician (Gastroenterology) Jacolyn Reedy, MD as Consulting Physician (Cardiology)  Advanced Directive information Does patient have an advance directive?: Yes, Does patient want to make changes to advanced directive?: No - Patient declined  Allergies  Allergen Reactions  . Ciprofloxacin Rash  . Advair Diskus [Fluticasone-Salmeterol] Other (See Comments)    Throat closes  . Albuterol     REACTION: closes throat  . Alendronate Sodium Itching  . Benadryl [Diphenhydramine Hcl] Itching  . Benzonatate     REACTION: rash/hives  . Cephalexin Nausea Only  . Gabapentin Other (See Comments)    Loss of memory  . Iohexol      Desc: rash and DIF BREATHING   . Keflex [Cephalexin] Nausea And Vomiting  . Morphine Nausea And Vomiting  . Other Other (See Comments)    Decongestants- keeps pt awake at night, increased heart rate  . Propoxyphene Hcl     Doesn't recall  . Reclast [Zoledronic Acid] Other (See Comments)    Chest pain  . Avapro [Irbesartan] Rash  . Codeine Nausea And Vomiting, Swelling and Rash  . Tessalon Perles Rash  . Tramadol Nausea Only and Rash    Chief Complaint  Patient presents with  . Acute Visit    Heart doctor told her to come in to have lungs checked, wheezing some.     HPI: Patient is a 75 y.o. female seen in the office today due to wheezing heard during cardiology. Reports she has been more short of breath for "a good week." reports a dry cough. More tightness in her chest and discomfort.  No congestion. No fever. Increase fatigue.  Chest xray reported Low lung volumes. Mild right infrahilar infiltrate cannot be excluded- this was done yesterday after cardiology visit.  Reports hx of vocal cord dysfunction.  No hx of COPD, saw pulmonary after hospitalization for pneumonia due to ongoing cough. However this has resolved and has not had an issues since.  Now she reports she is having a hard time getting her breath out. Felt like she could not breath the other night "Having to push it out" no pain when she takes a deep breath. No bloody sputum, no sputum noted  No swelling to LE or pain in LE.   Review of Systems:  Review of Systems  Constitutional: Positive for fatigue. Negative for activity change, appetite change and unexpected weight change.  HENT: Negative for congestion, hearing loss, postnasal drip and sore throat.   Eyes: Negative.   Respiratory: Positive for cough, chest tightness, shortness of breath and wheezing.   Cardiovascular: Negative for chest pain, palpitations and leg swelling.  Genitourinary: Negative for difficulty urinating and dysuria.  Musculoskeletal: Negative for arthralgias and myalgias.  Skin: Negative for color change.  Neurological: Positive for weakness. Negative for dizziness.  Psychiatric/Behavioral: Negative for agitation, behavioral problems and confusion.    Past Medical History:  Diagnosis Date  . Abdominal pain, unspecified site   . Abnormality of gait   . Apnea   . Cardiomegaly   . Chest pain, unspecified   . Complication of anesthesia    hard time waking up  . Depressive disorder, not elsewhere classified   . Diabetes mellitus   . Dizziness and giddiness   . Dyskinesia of esophagus   . Edema   .  Extrinsic asthma, unspecified   . Female stress incontinence   . Gout, unspecified   . High cholesterol   . Hypertension   . Lipoma of other skin and subcutaneous tissue   . Lumbago   . Memory loss   . Migraine without aura, with intractable migraine, so stated, with status migrainosus   . Mild cognitive impairment, so stated   . Nonspecific (abnormal) findings on radiological and other examination of abdominal area, including retroperitoneum   .  Nonspecific abnormal results of liver function study   . Obesity, unspecified   . Obstructive chronic bronchitis with exacerbation ([place])   . Other and unspecified hyperlipidemia   . Other B-complex deficiencies   . Other nonspecific abnormal serum enzyme levels   . Other specified cardiac dysrhythmias(427.89)   . Other specified disease of sebaceous glands   . Other symptoms involving cardiovascular system   . Pain in joint, ankle and foot   . Pain in joint, pelvic region and thigh   . Pain in joint, shoulder region   . Palpitations   . Reflux esophagitis   . Shortness of breath   . Tension headache   . Type I (juvenile type) diabetes mellitus without mention of complication, not stated as uncontrolled   . Type I (juvenile type) diabetes mellitus without mention of complication, uncontrolled   . Unspecified essential hypertension   . Unspecified hypothyroidism   . Unspecified vitamin D deficiency   . Ventral hernia, unspecified, without mention of obstruction or gangrene    Past Surgical History:  Procedure Laterality Date  . ABDOMINAL HYSTERECTOMY    . APPENDECTOMY    . BREAST SURGERY    . EXPLORATORY LAPAROTOMY W/ BOWEL RESECTION  07/25/2004   PROCEDURE: Laparoscopy, open laparotomy, resection of jejunojejunostomy  . INCISIONAL HERNIA REPAIR  03/19/2006   PROCEDURE: Open ventral hernia repair with mesh.  Marland Kitchen LAPAROSCOPIC GASTRIC BYPASS  07/22/2004   PROCEDURE: Laparoscopic Roux-en-Y gastric bypass, antecolic, antegastric,  . LAPAROSCOPIC INCISIONAL / UMBILICAL / VENTRAL HERNIA REPAIR  09/22/2005   PROCEDURE: Laparoscopic ventral hernia repair with mesh.  Marland Kitchen LEFT HEART CATHETERIZATION WITH CORONARY ANGIOGRAM N/A 08/21/2014   Procedure: LEFT HEART CATHETERIZATION WITH CORONARY ANGIOGRAM;  Surgeon: Sherren Mocha, MD;  Location: [facility] CATH LAB;  Service: Cardiovascular;  Laterality: N/A;  . stents     last time she said was 1 yr ago    Social History:   reports that she has never  smoked. She has never used smokeless tobacco. She reports that she does not drink alcohol or use drugs.  Family History  Problem Relation Age of Onset  . Stroke Mother   . Stroke Father   . Diabetes Father   . Heart disease Father   . Diabetes Son   . Cancer Brother     BLADDER  . Diabetes Brother     Medications: Patient's Medications  New Prescriptions   No medications on file  Previous Medications   AMLODIPINE (NORVASC) 2.5 MG TABLET    TAKE 1 TABLET (2.5 MG TOTAL) BY MOUTH DAILY.   ASPIRIN 81 MG TABLET    Take 81 mg by mouth daily.    CETIRIZINE (ZYRTEC) 10 MG TABLET    Take 10 mg by mouth daily as needed for allergies. For allergies   CHOLECALCIFEROL (VITAMIN D3) 5000 UNITS CAPS    Take 1 tablet by mouth daily. For supplement   CITALOPRAM (CELEXA) 40 MG TABLET    TAKE 1 TABLET (40 MG TOTAL) BY MOUTH DAILY.  1 BY MOUTH DAILY FOR DEPRESSION   DOCUSATE SODIUM (COLACE) 100 MG CAPSULE    Take 1 capsule (100 mg total) by mouth 2 (two) times daily.   GLIPIZIDE (GLUCOTROL XL) 10 MG 24 HR TABLET    Take one tablet by mouth in the morning and One tablet in the evening to control blood sugar   INSULIN NPH HUMAN (HUMULIN N) 100 UNIT/ML INJECTION    Inject 33 units in the morning and 33 units in the evening to control blood sugar   INSULIN REGULAR (NOVOLIN R,HUMULIN R) 100 UNITS/ML INJECTION    Inject 5 Units into the skin as directed. If blood glucose is above 200 mg/dL   LOSARTAN-HYDROCHLOROTHIAZIDE (HYZAAR) 50-12.5 MG PER TABLET    Take one tablet by mouth once daily to control blood pressure   MAGNESIUM OXIDE 250 MG TABS    Take 250 mg by mouth daily.    NITROGLYCERIN (NITROSTAT) 0.4 MG SL TABLET    Place 1 tablet (0.4 mg total) under the tongue every 5 (five) minutes as needed. For chest pain. Maximum of 3 tablets in 15 minutes.   OMEPRAZOLE (PRILOSEC) 40 MG CAPSULE    Take 40 mg by mouth daily.  Modified Medications   No medications on file  Discontinued Medications    CHLORPHENIRAMINE-HYDROCODONE (TUSSIONEX) 10-8 MG/5ML LQCR    Take 5 mLs by mouth every 12 (twelve) hours. Take scheduled for 1 week and then as needed for cough.     Physical Exam:  Vitals:   02/25/16 0840  BP: 112/80  Pulse: 91  Temp: 98 F (36.7 C)  TempSrc: Oral  SpO2: 94%  Weight: 193 lb 6.4 oz (87.7 kg)  Height: 5\' 2"  (1.575 m)   Body mass index is 35.37 kg/m.  Physical Exam  Constitutional: She is oriented to person, place, and time. She appears well-developed and well-nourished.  HENT:  Head: Normocephalic and atraumatic.  Eyes: Conjunctivae and EOM are normal. Pupils are equal, round, and reactive to light.  Neck: No JVD present. No tracheal deviation present. No thyromegaly present.  Cardiovascular: Normal rate, regular rhythm, normal heart sounds and intact distal pulses.   Pulmonary/Chest: She has wheezes (mildly distressed). She has no rales. She exhibits no tenderness.  Wheezing to bilateral lobes in front, diminished throughout   Abdominal: She exhibits no distension and no mass. There is no tenderness.  Musculoskeletal: Normal range of motion. She exhibits no edema or tenderness.  Lymphadenopathy:    She has no cervical adenopathy.  Neurological: She is alert and oriented to person, place, and time. No cranial nerve deficit. Coordination normal.  Psychiatric: Judgment and thought content normal.  Seems depressed and anxious.    Labs reviewed: Basic Metabolic Panel:  Recent Labs  08/07/15 0854 01/20/16 1044 01/20/16 1048 02/24/16 0951  NA 140 140  --  138  K 4.2 4.4  --  3.8  CL 102 102  --  100  CO2 24 29  --  27  GLUCOSE 135* 149*  --  286*  BUN 17 21  --  16  CREATININE 0.79 0.96*  --  0.93  CALCIUM 8.8 9.2  --  8.9  TSH 2.850  --  1.90  --    Liver Function Tests:  Recent Labs  04/29/15 0940 08/07/15 0854 01/20/16 1044  AST 16 13 16   ALT 15 12 14   ALKPHOS 102 99 80  BILITOT 0.4 0.5 0.6  PROT 5.6* 6.2 6.2  ALBUMIN 3.4* 3.7 3.7  No results for input(s): LIPASE, AMYLASE in the last 8760 hours. No results for input(s): AMMONIA in the last 8760 hours. CBC:  Recent Labs  02/24/16 0951  WBC 8.3  HGB 11.3*  HCT 36.8  MCV 83.1  PLT 369   Lipid Panel:  Recent Labs  04/29/15 0940 08/07/15 0854 01/20/16 1044  CHOL 184 215* 198  HDL 35* 45 43*  LDLCALC 108* 132* 118  TRIG 204* 191* 183*  CHOLHDL 5.3* 4.8* 4.6   TSH:  Recent Labs  08/07/15 0854 01/20/16 1048  TSH 2.850 1.90   A1C: Lab Results  Component Value Date   HGBA1C 7.8 (H) 01/20/2016     Assessment/Plan 1. Shortness of breath -80 mg IM depo metrol given in office  - Ambulatory referral to Pulmonology - starting tomorrow predniSONE (DELTASONE) 10 MG tablet; Take 40 mg daily for 3 days then decrease by 10 mg daily until complete (10 mg total) by mouth daily with breakfast.  Dispense: 18 tablet; Refill: 0 - CT ANGIO CHEST PE W OR WO CONTRAST today to rule out PE   Strict precautions given to patient to go to the ED if she feels like she is unable to breath/move air in and out/getting worse Pt agrees and understands  [place] Keena K. Harle Battiest  [facility] [phone] 8 am - 5 pm) [phone] (after hours)

## 2016-02-25 NOTE — Patient Instructions (Signed)
Go to [facility] radiology department for CT of chest  Start Prednisone 40 mg daily for 3 days tomorrow then decrease by 1 tablet daily until complete So 40 mg, 40 mg, 40 mg, 30 mg, 20mg , 10 mg Will send you to pulmonary for further testing

## 2016-02-28 ENCOUNTER — Encounter: Payer: Self-pay | Admitting: Cardiovascular Disease

## 2016-02-28 ENCOUNTER — Telehealth: Payer: Self-pay | Admitting: *Deleted

## 2016-02-28 NOTE — Telephone Encounter (Signed)
Great - thanks

## 2016-02-28 NOTE — Telephone Encounter (Signed)
Called patient to check on her breathing. Patient stated that she is doing alot better.

## 2016-03-04 ENCOUNTER — Other Ambulatory Visit: Payer: Self-pay | Admitting: Internal Medicine

## 2016-03-04 ENCOUNTER — Ambulatory Visit (INDEPENDENT_AMBULATORY_CARE_PROVIDER_SITE_OTHER): Payer: Medicare Other

## 2016-03-04 DIAGNOSIS — G8929 Other chronic pain: Secondary | ICD-10-CM

## 2016-03-04 DIAGNOSIS — Z23 Encounter for immunization: Secondary | ICD-10-CM | POA: Diagnosis not present

## 2016-03-04 DIAGNOSIS — M545 Low back pain: Principal | ICD-10-CM

## 2016-03-04 MED ORDER — PREDNISONE 5 MG PO TABS: ORAL_TABLET | ORAL | 3 refills | Status: DC

## 2016-03-23 ENCOUNTER — Other Ambulatory Visit: Payer: Self-pay | Admitting: Internal Medicine

## 2016-03-26 ENCOUNTER — Ambulatory Visit: Payer: Medicare Other | Admitting: Internal Medicine

## 2016-03-29 ENCOUNTER — Other Ambulatory Visit: Payer: Self-pay | Admitting: Internal Medicine

## 2016-04-14 ENCOUNTER — Telehealth: Payer: Self-pay | Admitting: Internal Medicine

## 2016-04-14 NOTE — Telephone Encounter (Signed)
left msg asking pt to confirm this AWV appt w/ nurse. VDM (DD) °

## 2016-04-20 ENCOUNTER — Other Ambulatory Visit: Payer: Self-pay | Admitting: Internal Medicine

## 2016-04-20 DIAGNOSIS — H543 Unqualified visual loss, both eyes: Principal | ICD-10-CM

## 2016-04-20 DIAGNOSIS — E113513 Type 2 diabetes mellitus with proliferative diabetic retinopathy with macular edema, bilateral: Secondary | ICD-10-CM

## 2016-04-21 DIAGNOSIS — H35372 Puckering of macula, left eye: Secondary | ICD-10-CM | POA: Diagnosis not present

## 2016-04-21 DIAGNOSIS — H43813 Vitreous degeneration, bilateral: Secondary | ICD-10-CM | POA: Diagnosis not present

## 2016-04-21 DIAGNOSIS — Z961 Presence of intraocular lens: Secondary | ICD-10-CM | POA: Diagnosis not present

## 2016-05-05 DIAGNOSIS — H43811 Vitreous degeneration, right eye: Secondary | ICD-10-CM | POA: Diagnosis not present

## 2016-05-05 DIAGNOSIS — H353131 Nonexudative age-related macular degeneration, bilateral, early dry stage: Secondary | ICD-10-CM | POA: Diagnosis not present

## 2016-05-05 DIAGNOSIS — E113393 Type 2 diabetes mellitus with moderate nonproliferative diabetic retinopathy without macular edema, bilateral: Secondary | ICD-10-CM | POA: Diagnosis not present

## 2016-05-05 DIAGNOSIS — H35372 Puckering of macula, left eye: Secondary | ICD-10-CM | POA: Diagnosis not present

## 2016-05-13 ENCOUNTER — Other Ambulatory Visit: Payer: Self-pay | Admitting: Internal Medicine

## 2016-05-13 DIAGNOSIS — Z794 Long term (current) use of insulin: Principal | ICD-10-CM

## 2016-05-13 DIAGNOSIS — E113513 Type 2 diabetes mellitus with proliferative diabetic retinopathy with macular edema, bilateral: Secondary | ICD-10-CM

## 2016-05-14 ENCOUNTER — Telehealth: Payer: Self-pay | Admitting: Internal Medicine

## 2016-05-14 NOTE — Telephone Encounter (Signed)
left another msg asking pt to confirm this AWV appt w/ nurse. VDM (DD) °

## 2016-05-22 ENCOUNTER — Other Ambulatory Visit: Payer: Medicare Other

## 2016-05-24 IMAGING — CR DG CHEST 2V
2 series · 2 of 2 positions shown · non-contrast
Comparison: 06/11/2014

CLINICAL DATA: Chest pain, nausea and vomiting

EXAM:
CHEST  2 VIEW

[w chest pa]
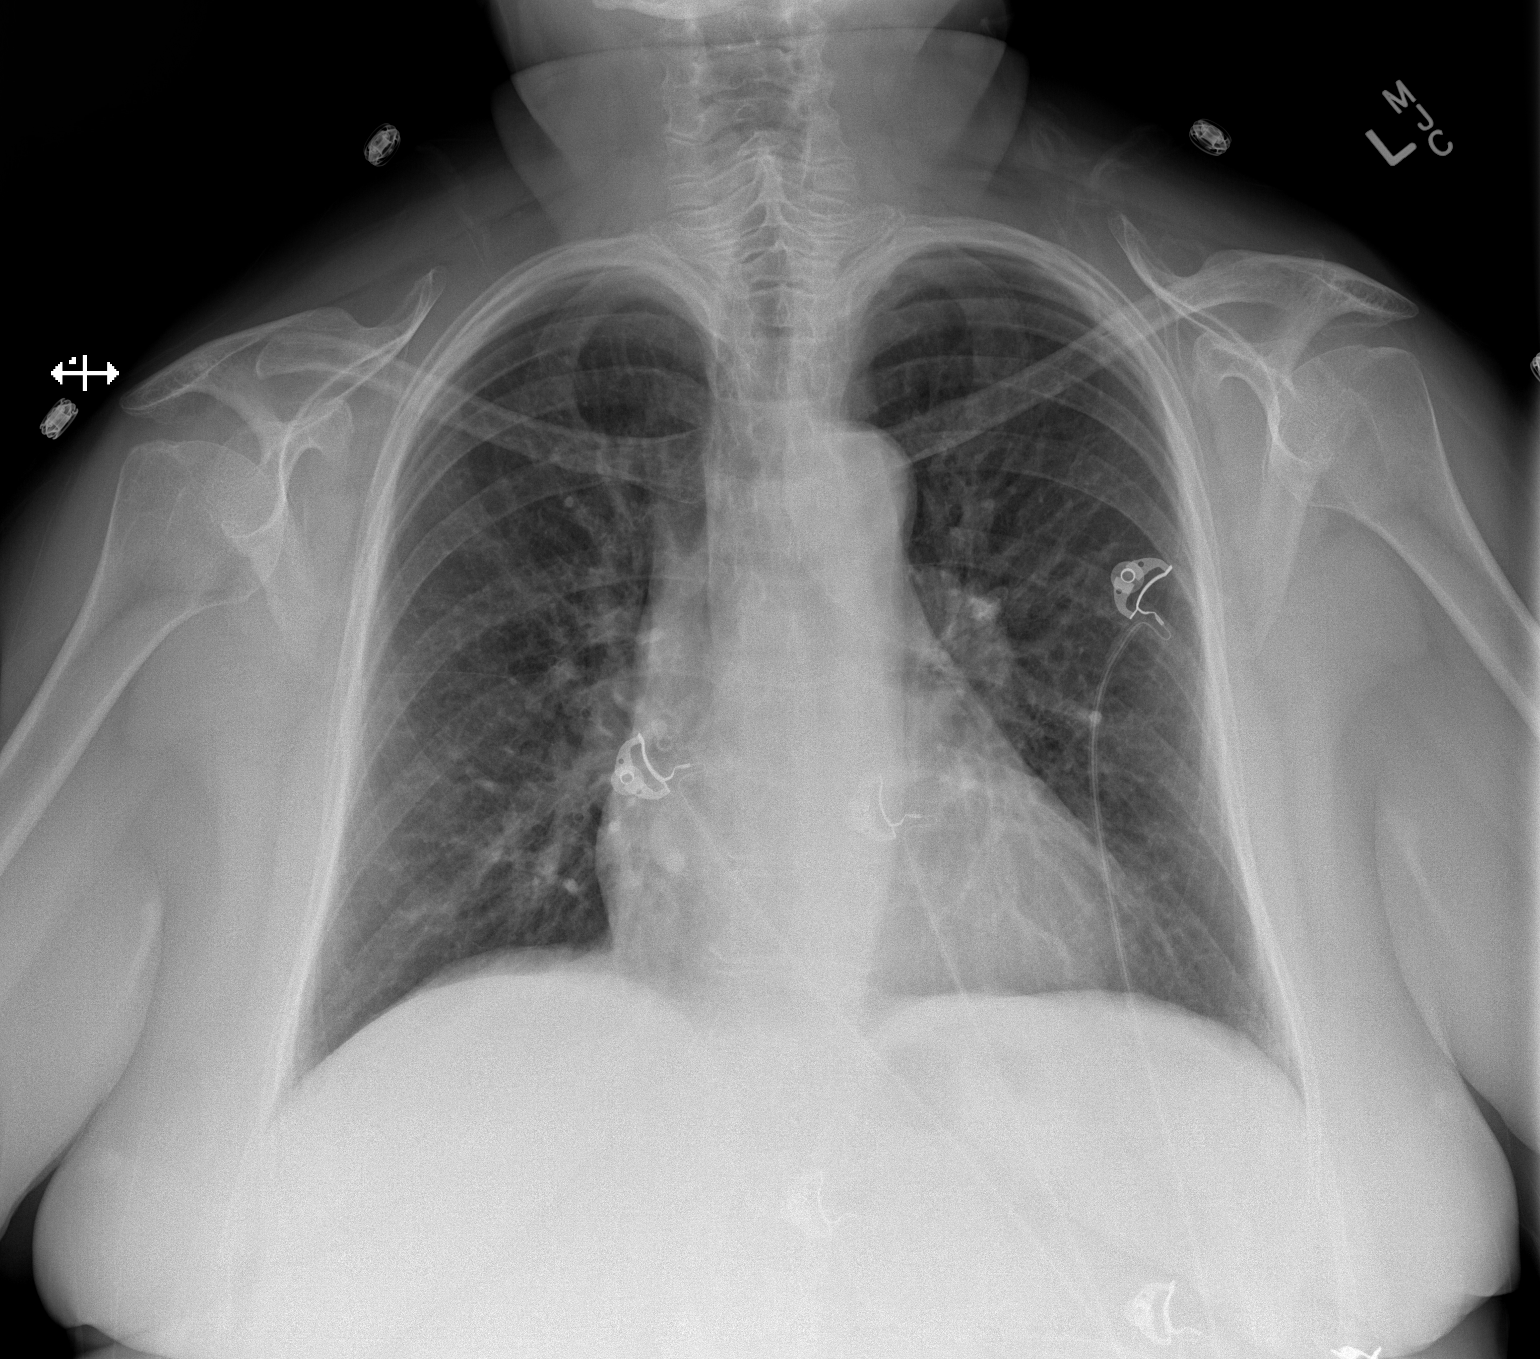

[w chest lat]
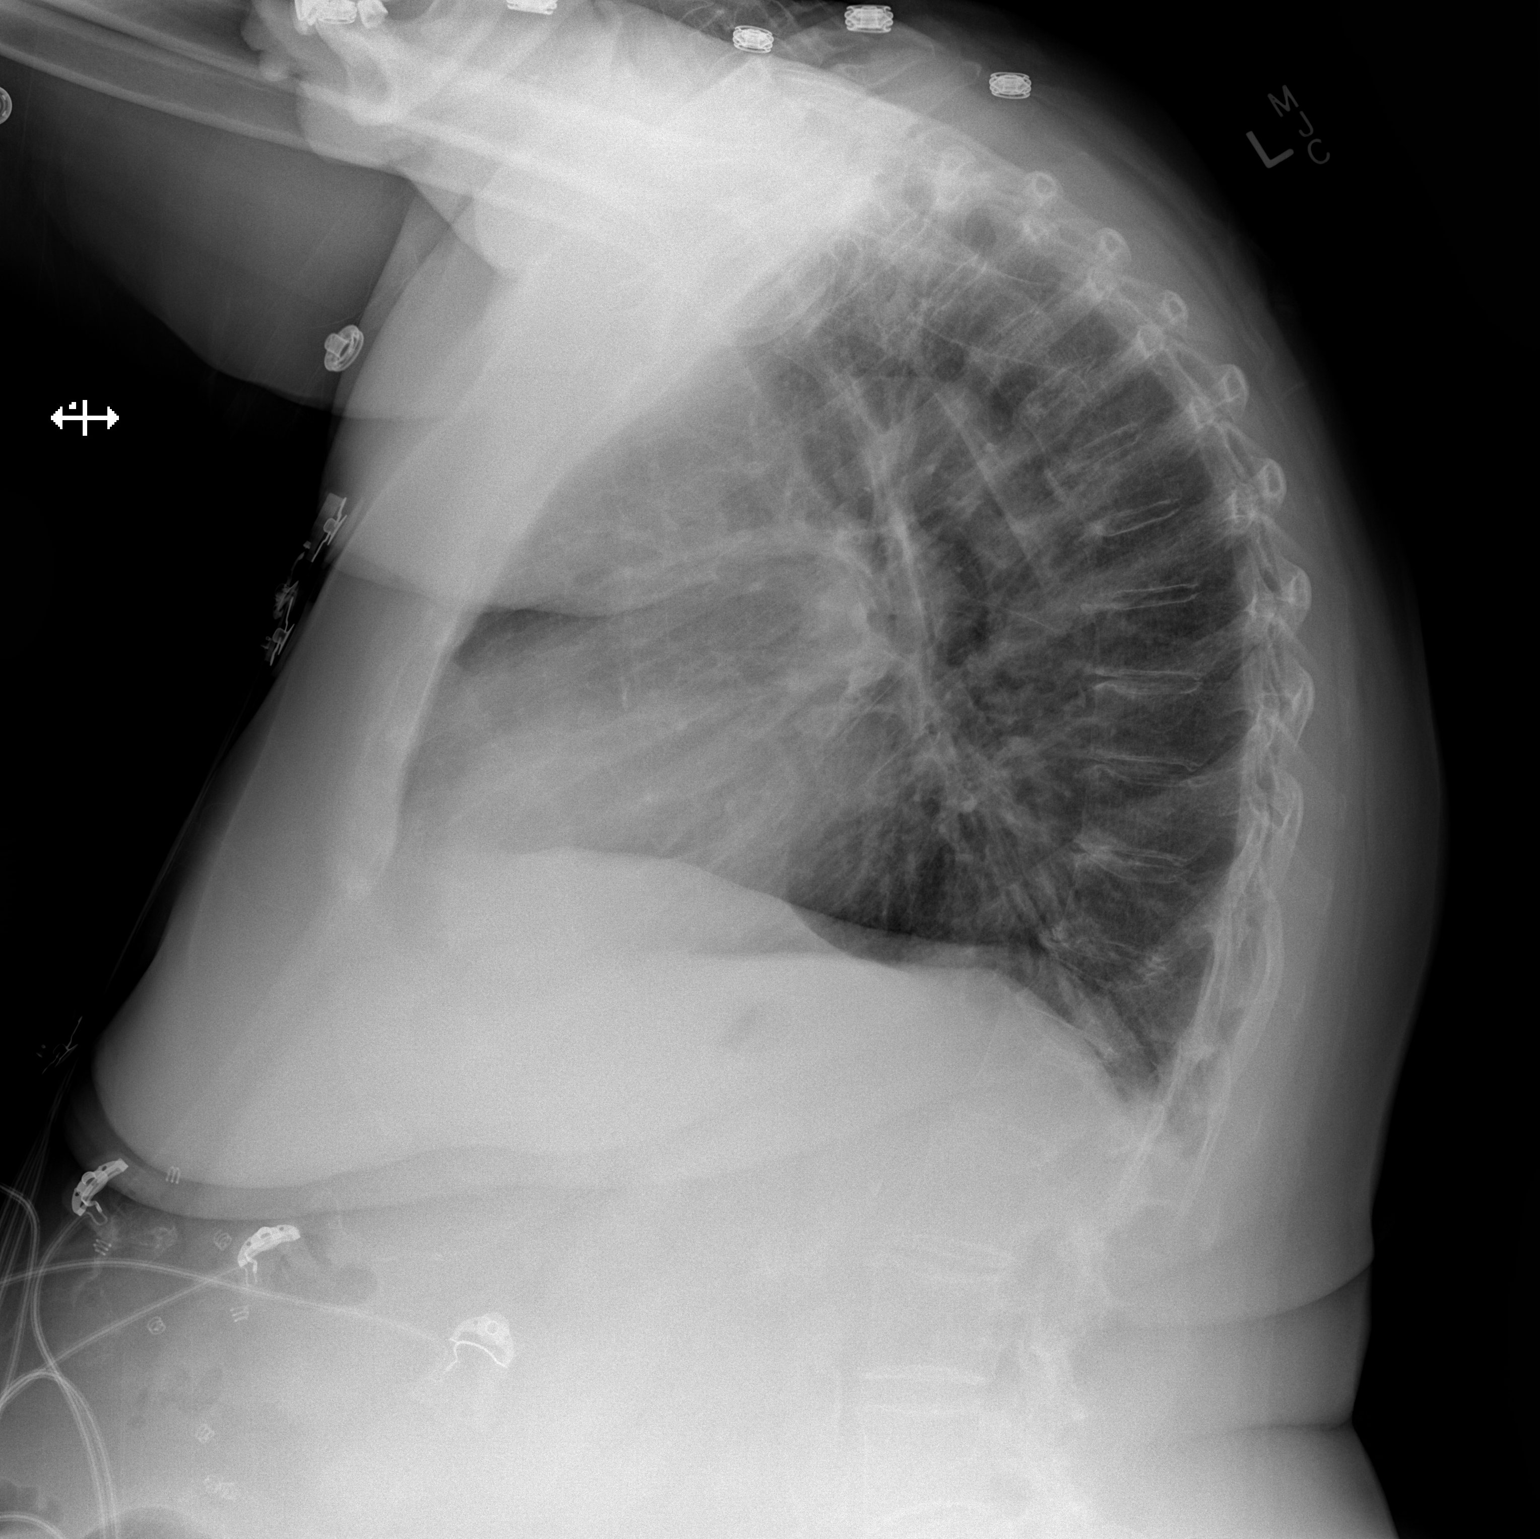

[2 of 2 positions shown; findings below may reference images not displayed]

FINDINGS: There is mild vascular prominence without interstitial or alveolar
edema. There are no airspace opacities. There are no effusions.
There is chronic compression of a lower thoracic vertebral body,
unchanged.
IMPRESSION: Mild vascular congestion without interstitial or alveolar edema.

## 2016-05-24 IMAGING — CT CT ABD-PELV W/ CM
1 of 3 series · 14 of 32 positions shown, 19 images · IV contrast (omnipaque)
Comparison: 01/24/2009

CLINICAL DATA: Chest pain, nausea, vomiting, diarrhea for 5 hours.

EXAM:
CT ABDOMEN AND PELVIS WITH CONTRAST
TECHNIQUE: Multidetector CT imaging of the abdomen and pelvis was performed
using the standard protocol following bolus administration of
intravenous contrast.
CONTRAST:  50mL OMNIPAQUE IOHEXOL 300 MG/ML SOLN, 100mL OMNIPAQUE
IOHEXOL 300 MG/ML SOLN

[Series 2: abd/pel with · axial · 0.92mm/px · z∈[-413,-43]mm · 14 of 84 slices shown, 19 images]
[im 5/84  soft-tissue]
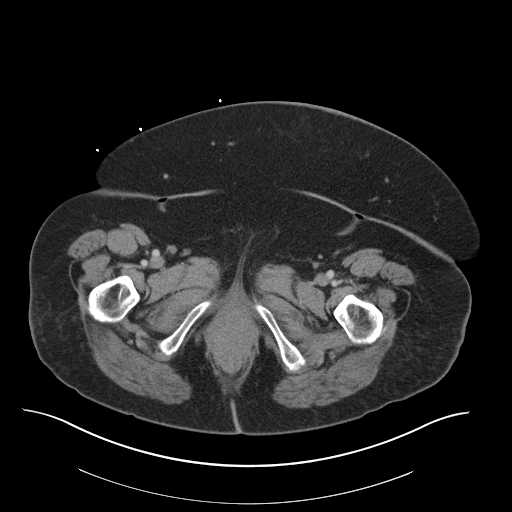
[im 5/84  bone]
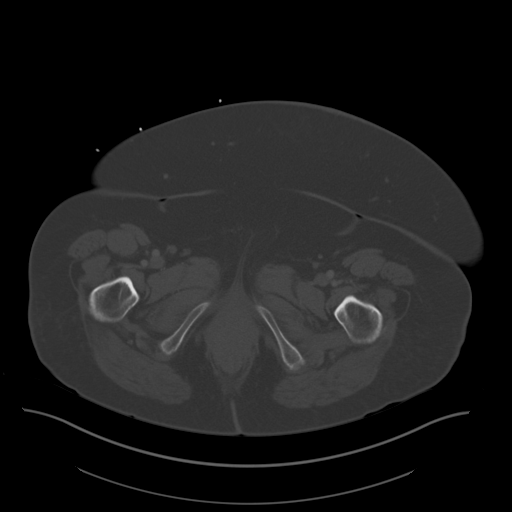
[im 10/84  soft-tissue]
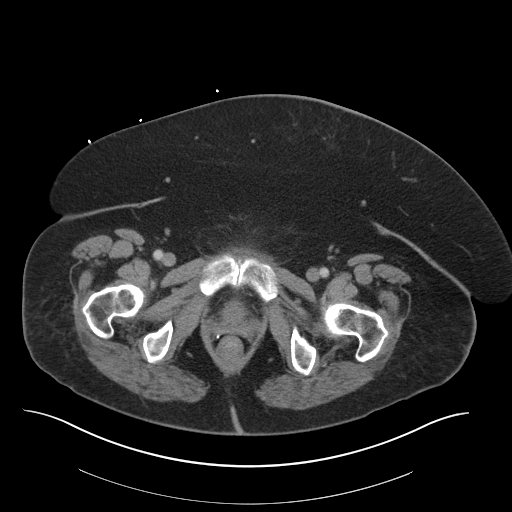
[im 19/84  soft-tissue]
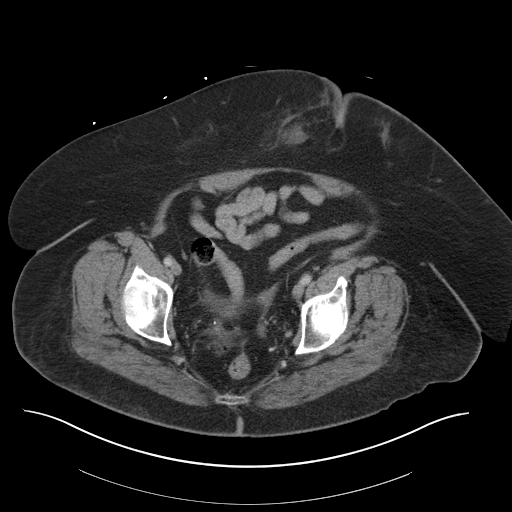
[im 24/84  soft-tissue]
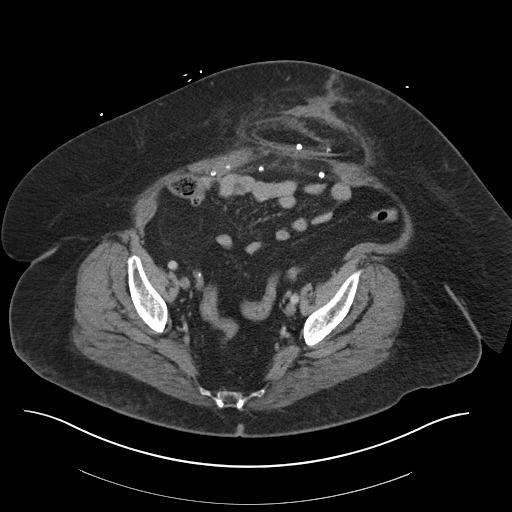
[im 28/84  soft-tissue]
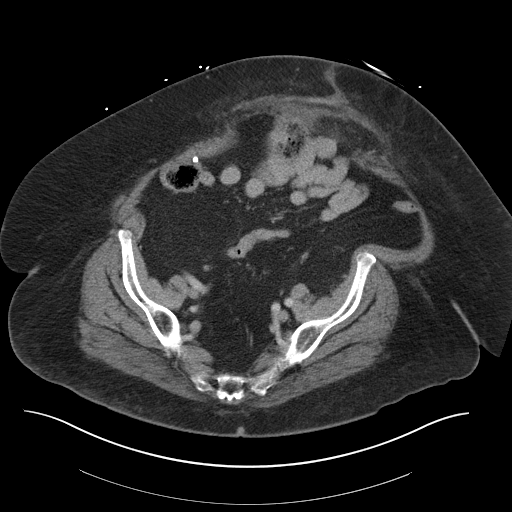
[im 37/84  soft-tissue]
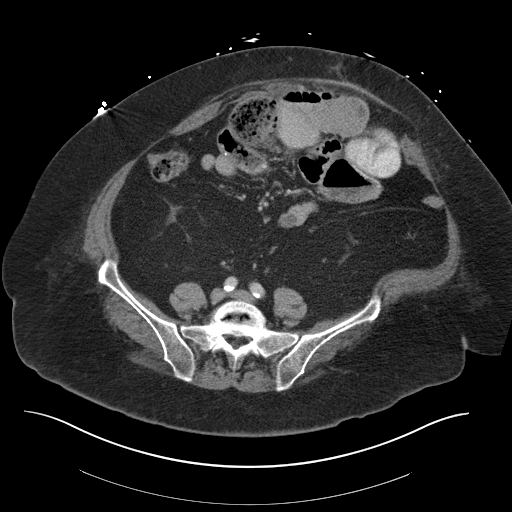
[im 42/84  soft-tissue]
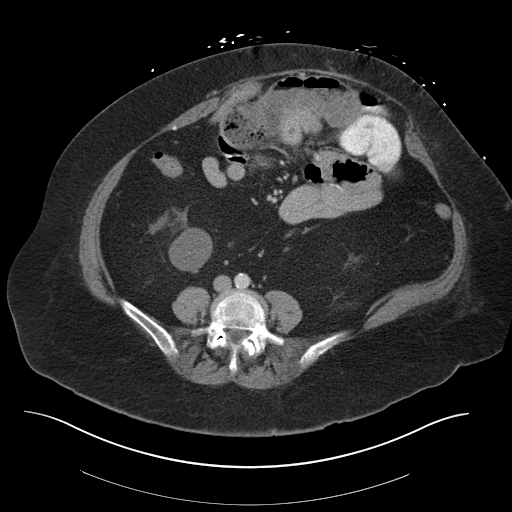
[im 47/84  soft-tissue]
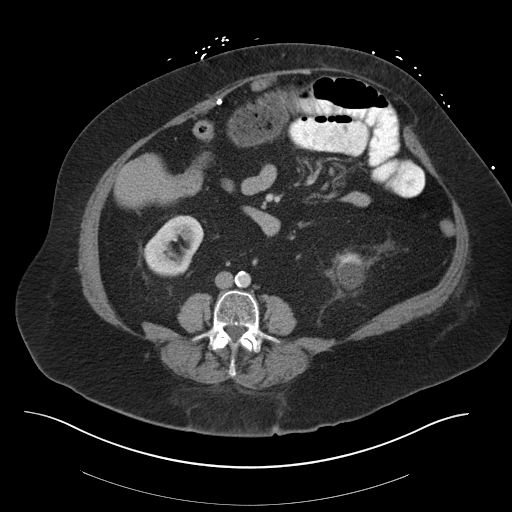
[im 56/84  soft-tissue]
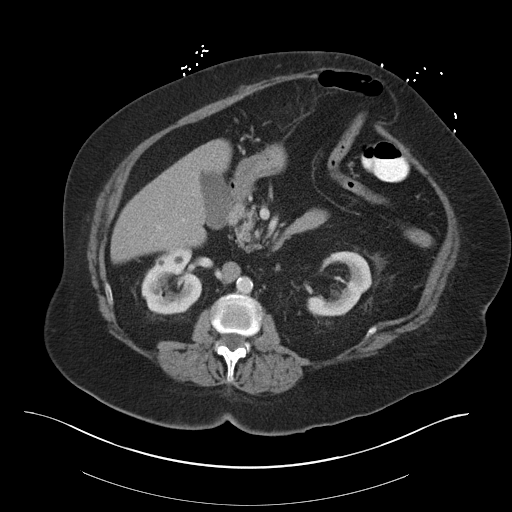
[im 56/84  bone]
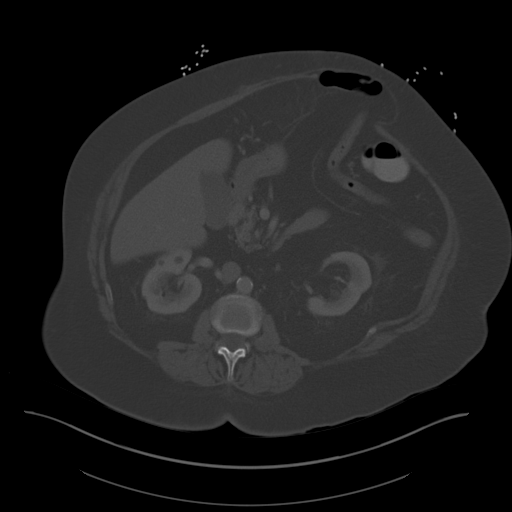
[im 60/84  soft-tissue]
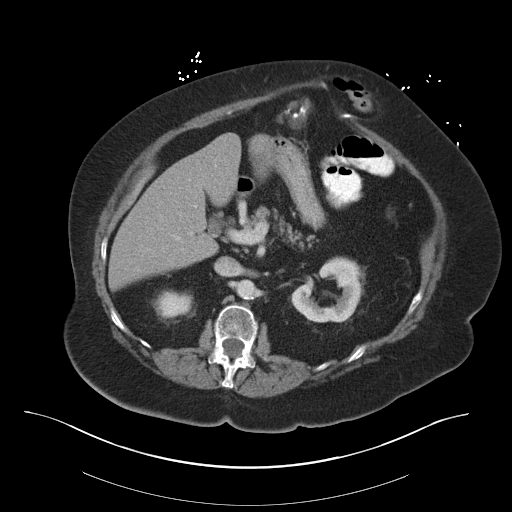
[im 65/84  soft-tissue]
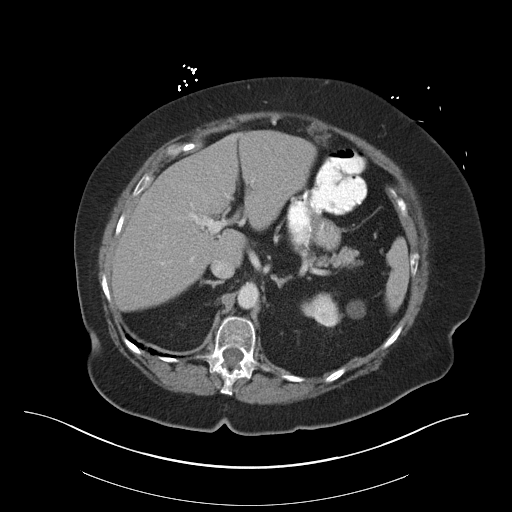
[im 65/84  lung]
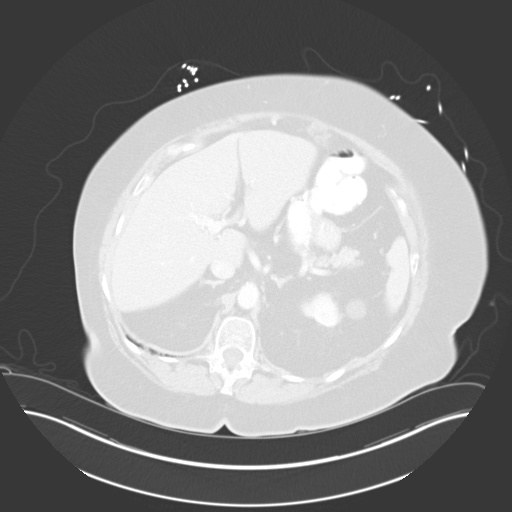
[im 70/84  lung]
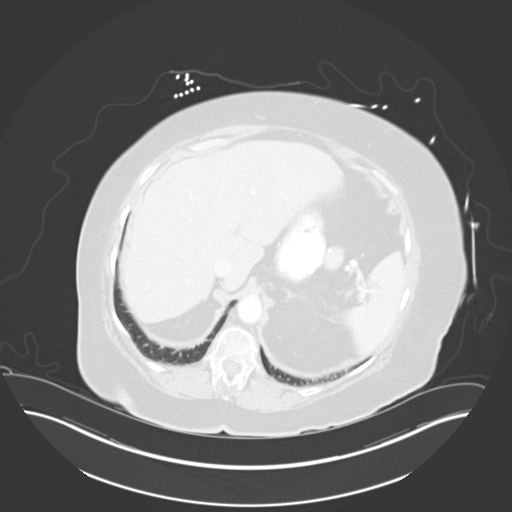
[im 74/84  soft-tissue]
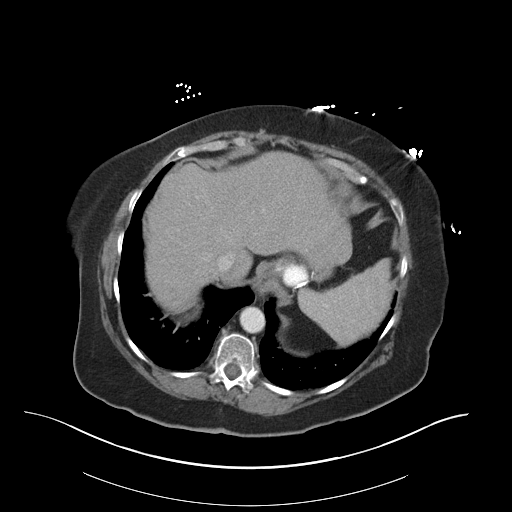
[im 74/84  lung]
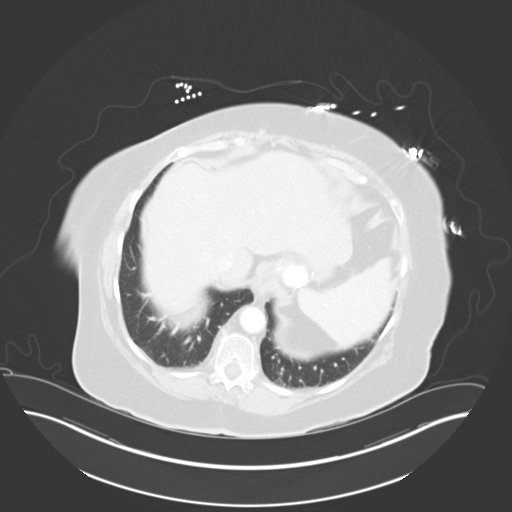
[im 79/84  soft-tissue]
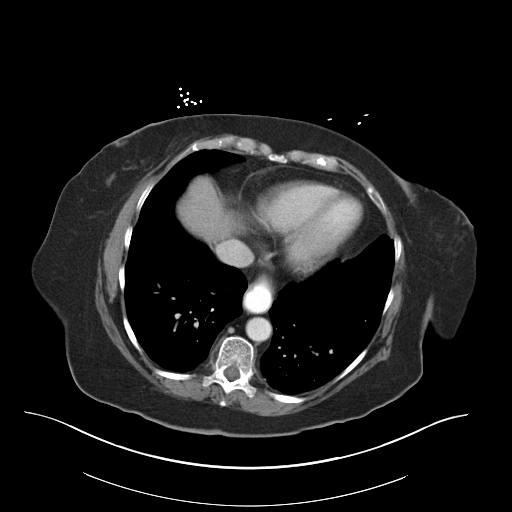
[im 79/84  lung]
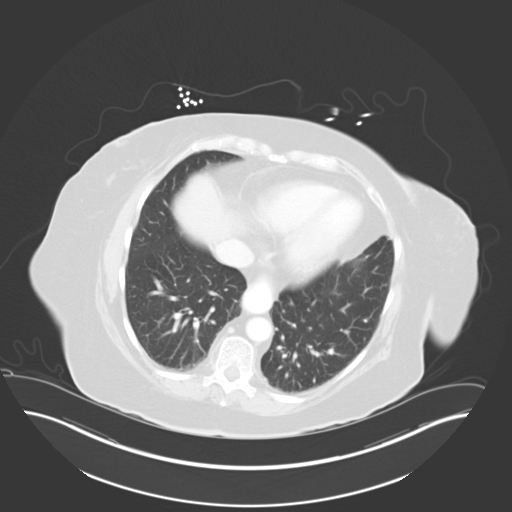

[14 of 32 positions shown; findings below may reference images not displayed]

FINDINGS: There is a high-grade obstruction of the mid jejunum, with marked
dilatation of bowel proximal to the transition point. The transition
point is in the mid abdomen just to the left of midline, probably in
close proximity to the roux-en-Y anastomosis. There is a large
ventral hernia in the same area, but the hernia does not appear to
be related to the obstruction. The distal jejunum and the entire
ileum are decompressed. The colon is decompressed. There is no
extraluminal air. There are unremarkable appearances of the
gastrojejunostomy.

There are normal appearances of the liver with the exception of a
tiny benign hypodensity which is unchanged from 6101. There are
normal appearances of the spleen, pancreas, adrenals and kidneys
with the exception of a few benign appearing renal cysts. The
abdominal aorta is normal in caliber with mild atherosclerotic
calcification. Gallbladder and bile ducts are remarkable only for a
few small calculi in the gallbladder lumen. There is no bile duct
dilatation.

There is hysterectomy.

No significant abnormality is evident in the lower chest.
IMPRESSION: *High-grade small bowel obstruction in the mid jejunum, most likely
due to adhesion.
*Large ventral hernia which is not the etiology of the bowel
obstruction.
*Unremarkable gastric jejunostomy
*Cholelithiasis

## 2016-05-27 ENCOUNTER — Ambulatory Visit: Payer: Medicare Other

## 2016-05-27 ENCOUNTER — Ambulatory Visit: Payer: Medicare Other | Admitting: Internal Medicine

## 2016-06-04 ENCOUNTER — Emergency Department (HOSPITAL_COMMUNITY)
Admission: EM | Admit: 2016-06-04 | Discharge: 2016-06-05 | Disposition: A | Discharge status: Home / Self Care | Payer: Medicare Other | Attending: Emergency Medicine | Admitting: Emergency Medicine

## 2016-06-04 ENCOUNTER — Emergency Department (HOSPITAL_COMMUNITY): Payer: Medicare Other

## 2016-06-04 ENCOUNTER — Encounter (HOSPITAL_COMMUNITY): Payer: Self-pay

## 2016-06-04 DIAGNOSIS — Z79899 Other long term (current) drug therapy: Secondary | ICD-10-CM | POA: Insufficient documentation

## 2016-06-04 DIAGNOSIS — E039 Hypothyroidism, unspecified: Secondary | ICD-10-CM | POA: Diagnosis not present

## 2016-06-04 DIAGNOSIS — R0602 Shortness of breath: Secondary | ICD-10-CM | POA: Insufficient documentation

## 2016-06-04 DIAGNOSIS — I2510 Atherosclerotic heart disease of native coronary artery without angina pectoris: Secondary | ICD-10-CM | POA: Diagnosis not present

## 2016-06-04 DIAGNOSIS — J209 Acute bronchitis, unspecified: Secondary | ICD-10-CM | POA: Insufficient documentation

## 2016-06-04 DIAGNOSIS — R05 Cough: Secondary | ICD-10-CM | POA: Diagnosis present

## 2016-06-04 DIAGNOSIS — Z7982 Long term (current) use of aspirin: Secondary | ICD-10-CM | POA: Diagnosis not present

## 2016-06-04 DIAGNOSIS — E1165 Type 2 diabetes mellitus with hyperglycemia: Secondary | ICD-10-CM | POA: Diagnosis not present

## 2016-06-04 DIAGNOSIS — I10 Essential (primary) hypertension: Secondary | ICD-10-CM | POA: Insufficient documentation

## 2016-06-04 DIAGNOSIS — E1065 Type 1 diabetes mellitus with hyperglycemia: Secondary | ICD-10-CM | POA: Diagnosis not present

## 2016-06-04 DIAGNOSIS — Z794 Long term (current) use of insulin: Secondary | ICD-10-CM | POA: Insufficient documentation

## 2016-06-04 DIAGNOSIS — R069 Unspecified abnormalities of breathing: Secondary | ICD-10-CM | POA: Diagnosis not present

## 2016-06-04 DIAGNOSIS — R0600 Dyspnea, unspecified: Secondary | ICD-10-CM | POA: Diagnosis not present

## 2016-06-04 LAB — CBG MONITORING, ED: Glucose-Capillary: 254 mg/dL — ABNORMAL HIGH (ref 65–99)

## 2016-06-04 LAB — CBC WITH DIFFERENTIAL/PLATELET
BASOS PCT: 0 %
Basophils Absolute: 0 10*3/uL (ref 0.0–0.1)
EOS ABS: 0 10*3/uL (ref 0.0–0.7)
Eosinophils Relative: 1 %
HEMATOCRIT: 38.5 % (ref 36.0–46.0)
Hemoglobin: 12 g/dL (ref 12.0–15.0)
Lymphocytes Relative: 12 %
Lymphs Abs: 0.9 10*3/uL (ref 0.7–4.0)
MCH: 26.8 pg (ref 26.0–34.0)
MCHC: 31.2 g/dL (ref 30.0–36.0)
MCV: 86.1 fL (ref 78.0–100.0)
MONO ABS: 0.3 10*3/uL (ref 0.1–1.0)
MONOS PCT: 5 %
Neutro Abs: 6.2 10*3/uL (ref 1.7–7.7)
Neutrophils Relative %: 82 %
Platelets: 311 10*3/uL (ref 150–400)
RBC: 4.47 MIL/uL (ref 3.87–5.11)
RDW: 15.5 % (ref 11.5–15.5)
WBC: 7.5 10*3/uL (ref 4.0–10.5)

## 2016-06-04 MED ORDER — IPRATROPIUM BROMIDE 0.02 % IN SOLN
0.5000 mg | Freq: Once | RESPIRATORY_TRACT | Status: AC
  Administered 2016-06-04: 0.5 mg via RESPIRATORY_TRACT
  Filled 2016-06-04: qty 2.5

## 2016-06-04 NOTE — ED Notes (Signed)
Bed: RESB Expected date:  Expected time:  Means of arrival:  Comments: Respiratory distress 

## 2016-06-04 NOTE — ED Notes (Signed)
Pt complains of breathing difficulties for two days, she states that she's allergic to albuterol and can't take breathing treatments, she usually treats her breathing problems with essential oils Pt also states that the manuel blood pressure cuff hurts her arm because she has fibromyalgia and refused to let EMS or fire take her pressure

## 2016-06-04 NOTE — Progress Notes (Signed)
Pt started on atrovent neb per md rx.  Vitals wnl prior to neb tx given.  Within two minutes of neb administration, pt was complaining of her throat closing up.  Neb immediately stopped.  Dr Winfred Leeds at bedside.  Pt states the same thing happens with albuterol nebs and inhalers she has tried in the past.  Vitals stayed within normal limits with no desaturation.  RN notified.  No further orders received by MD at this time.  RT will monitor and assess as needed.

## 2016-06-04 NOTE — ED Provider Notes (Signed)
[facility] DEPT Provider Note   CSN: [id number] Arrival date & time: 06/04/16  2153     History   Chief Complaint Chief Complaint  Patient presents with  . Shortness of Breath    HPI MARVETTE FIGUERO is a 76 y.o. female.Plains of cough productive of yellowish sputum for the past 3 days the cut it by shortness of breath. She denies any fever denies chest pain denies other associated symptoms. No treatment at home. Brought by EMS treated patient with supplemental oxygen. Nothing makes symptoms better or worse.  HPI  Past Medical History:  Diagnosis Date  . Abdominal pain, unspecified site   . Abnormality of gait   . Apnea   . Cardiomegaly   . Chest pain, unspecified   . Complication of anesthesia    hard time waking up  . Depressive disorder, not elsewhere classified   . Diabetes mellitus   . Dizziness and giddiness   . Dyskinesia of esophagus   . Edema   . Extrinsic asthma, unspecified   . Female stress incontinence   . Gout, unspecified   . High cholesterol   . Hypertension   . Lipoma of other skin and subcutaneous tissue   . Lumbago   . Memory loss   . Migraine without aura, with intractable migraine, so stated, with status migrainosus   . Mild cognitive impairment, so stated   . Nonspecific (abnormal) findings on radiological and other examination of abdominal area, including retroperitoneum   . Nonspecific abnormal results of liver function study   . Obesity, unspecified   . Obstructive chronic bronchitis with exacerbation ([place])   . Other and unspecified hyperlipidemia   . Other B-complex deficiencies   . Other nonspecific abnormal serum enzyme levels   . Other specified cardiac dysrhythmias(427.89)   . Other specified disease of sebaceous glands   . Other symptoms involving cardiovascular system   . Pain in joint, ankle and foot   . Pain in joint, pelvic region and thigh   . Pain in joint, shoulder region   . Palpitations   . Reflux esophagitis   .  Shortness of breath   . Tension headache   . Type I (juvenile type) diabetes mellitus without mention of complication, not stated as uncontrolled   . Type I (juvenile type) diabetes mellitus without mention of complication, uncontrolled   . Unspecified essential hypertension   . Unspecified hypothyroidism   . Unspecified vitamin D deficiency   . Ventral hernia, unspecified, without mention of obstruction or gangrene    Vocal cord dysfunction Patient Active Problem List   Diagnosis Date Noted  . Marital dysfunction 01/22/2016  . CAD (coronary artery disease), native coronary artery 08/22/2014  . Takotsubo syndrome 08/22/2014  . Obesity (BMI 30-39.9)   . Recurrent ventral incisional hernia 08/20/2014  . Elevated troponin   . Chronic pain syndrome 08/22/2013  . Essential hypertension, benign 04/17/2013  . DM2 (diabetes mellitus, type 2) ([place]) 01/27/2013  . HLD (hyperlipidemia) 01/27/2013  . Lumbago   . Hypothyroidism   . Vitamin B12 deficiency 12/18/2009  . Depression 12/18/2009  . Vitamin D deficiency disease 02/13/2009  . Esophageal spasm 02/13/2009  . Pulmonary nodule 06/18/2008  . Esophageal reflux 04/12/2007  . Carotid bruit 04/21/2006  . Extrinsic asthma 08/25/2005  . Gout 11/12/2004    Past Surgical History:  Procedure Laterality Date  . ABDOMINAL HYSTERECTOMY    . APPENDECTOMY    . BREAST SURGERY    . EXPLORATORY LAPAROTOMY W/ BOWEL RESECTION  07/25/2004   PROCEDURE: Laparoscopy, open laparotomy, resection of jejunojejunostomy  . INCISIONAL HERNIA REPAIR  03/19/2006   PROCEDURE: Open ventral hernia repair with mesh.  Marland Kitchen LAPAROSCOPIC GASTRIC BYPASS  07/22/2004   PROCEDURE: Laparoscopic Roux-en-Y gastric bypass, antecolic, antegastric,  . LAPAROSCOPIC INCISIONAL / UMBILICAL / VENTRAL HERNIA REPAIR  09/22/2005   PROCEDURE: Laparoscopic ventral hernia repair with mesh.  Marland Kitchen LEFT HEART CATHETERIZATION WITH CORONARY ANGIOGRAM N/A 08/21/2014   Procedure: LEFT HEART  CATHETERIZATION WITH CORONARY ANGIOGRAM;  Surgeon: Sherren Mocha, MD;  Location: [facility] C[facility] LAB;  Service: Cardiovascular;  Laterality: N/A;  . stents     last time she said was 1 yr ago     OB History    No data available       Home Medications    Prior to Admission medications   Medication Sig Start Date End Date Taking? Authorizing Provider  amLODipine (NORVASC) 2.5 MG tablet TAKE 1 TABLET (2.5 MG TOTAL) BY MOUTH DAILY. 11/04/15  Yes [name], MD  aspirin 81 MG tablet Take 81 mg by mouth daily.    Yes Historical Provider, MD  cetirizine (ZYRTEC) 10 MG tablet Take 10 mg by mouth daily as needed for allergies. For allergies   Yes Historical Provider, MD  Cholecalciferol (VITAMIN D3) 5000 UNITS CAPS Take 1 tablet by mouth daily. For supplement   Yes Historical Provider, MD  CINNAMON PO Take 1,000 mg by mouth daily.   Yes Historical Provider, MD  citalopram (CELEXA) 40 MG tablet TAKE 1 TABLET (40 MG TOTAL) BY MOUTH DAILY. 1 BY MOUTH DAILY FOR DEPRESSION 03/30/16  Yes [name], MD  glipiZIDE (GLUCOTROL XL) 10 MG 24 hr tablet TAKE ONE TABLET BY MOUTH IN THE MORNING AND ONE TABLET IN THE EVENING TO CONTROL BLOOD SUGAR 04/20/16  Yes [name], MD  HUMULIN N 100 UNIT/ML injection INJECT 33 UNITS IN THE MORNING AND 33 UNITS IN THE EVENING TO CONTROL BLOOD SUGAR Patient taking differently: INJECT 30 UNITS IN THE MORNING AND 30 UNITS IN THE EVENING TO CONTROL BLOOD SUGAR 05/13/16  Yes [name], MD  losartan-hydrochlorothiazide (HYZAAR) 50-12.5 MG tablet TAKE ONE TABLET BY MOUTH ONCE DAILY TO CONTROL BLOOD PRESSURE 03/23/16  Yes [name], MD  Magnesium Oxide 250 MG TABS Take 500 mg by mouth daily.    Yes Historical Provider, MD  nitroGLYCERIN (NITROSTAT) 0.4 MG SL tablet Place 1 tablet (0.4 mg total) under the tongue every 5 (five) minutes as needed. For chest pain. Maximum of 3 tablets in 15 minutes. 12/06/12  Yes [name], MD  omeprazole (PRILOSEC) 40 MG capsule Take  40 mg by mouth daily.   Yes Historical Provider, MD  predniSONE (DELTASONE) 5 MG tablet One each morning to reduce pain 03/04/16  Yes [name], MD  senna-docusate (SENOKOT-S) 8.6-50 MG tablet Take 1 tablet by mouth daily.   Yes Historical Provider, MD  docusate sodium (COLACE) 100 MG capsule Take 1 capsule (100 mg total) by mouth 2 (two) times daily. Patient not taking: Reported on 06/04/2016 10/05/14   [name], MD  insulin NPH Human (HUMULIN N) 100 UNIT/ML injection Inject 33 units in the morning and 33 units in the evening to control blood sugar Patient not taking: Reported on 06/04/2016 08/14/15   [name], MD    Family History Family History  Problem Relation Age of Onset  . Stroke Mother   . Stroke Father   . Diabetes Father   .  Heart disease Father   . Diabetes Son   . Cancer Brother     BLADDER  . Diabetes Brother     Social History Social History  Substance Use Topics  . Smoking status: Never Smoker  . Smokeless tobacco: Never Used  . Alcohol use No     Allergies   Ciprofloxacin; Advair diskus [fluticasone-salmeterol]; Albuterol; Alendronate sodium; Benadryl [diphenhydramine hcl]; Benzonatate; Cephalexin; Gabapentin; Iohexol; Keflex [cephalexin]; Morphine; Other; Propoxyphene hcl; Reclast [zoledronic acid]; Avapro [irbesartan]; Codeine; Tessalon perles; and Tramadol   Review of Systems Review of Systems  Constitutional: Negative.   HENT: Negative.   Respiratory: Positive for cough and shortness of breath.   Cardiovascular: Negative.   Gastrointestinal: Negative.   Musculoskeletal: Negative.   Skin: Negative.   Allergic/Immunologic: Positive for immunocompromised state.       Diabetic  Neurological: Negative.   Psychiatric/Behavioral: Negative.   All other systems reviewed and are negative.    Physical Exam Updated Vital Signs BP 118/66 (BP Location: Left Arm)   Pulse 87   Temp 99.2 F (37.3 C) (Oral)   Resp 22   SpO2 97%   Physical Exam    Constitutional: She is oriented to person, place, and time. She appears well-developed and well-nourished. No distress.  HENT:  Head: Normocephalic and atraumatic.  Eyes: Conjunctivae are normal. Pupils are equal, round, and reactive to light.  Neck: Neck supple. No tracheal deviation present. No thyromegaly present.  Cardiovascular: Normal rate and regular rhythm.   No murmur heard. Pulmonary/Chest: Effort normal. She has wheezes.  Expiratory wheezes  Abdominal: Soft. Bowel sounds are normal. She exhibits no distension. There is no tenderness.  Musculoskeletal: Normal range of motion. She exhibits no edema or tenderness.  Neurological: She is alert and oriented to person, place, and time. Coordination normal.  Skin: Skin is warm and dry. No rash noted.  Psychiatric: She has a normal mood and affect.  Nursing note and vitals reviewed.  Results for orders placed or performed during the hospital encounter of 06/04/16  CBC with Differential/Platelet  Result Value Ref Range   WBC 7.5 4.0 - 10.5 K/uL   RBC 4.47 3.87 - 5.11 MIL/uL   Hemoglobin 12.0 12.0 - 15.0 g/dL   HCT 38.5 36.0 - 46.0 %   MCV 86.1 78.0 - 100.0 fL   MCH 26.8 26.0 - 34.0 pg   MCHC 31.2 30.0 - 36.0 g/dL   RDW 15.5 11.5 - 15.5 %   Platelets 311 150 - 400 K/uL   Neutrophils Relative % 82 %   Neutro Abs 6.2 1.7 - 7.7 K/uL   Lymphocytes Relative 12 %   Lymphs Abs 0.9 0.7 - 4.0 K/uL   Monocytes Relative 5 %   Monocytes Absolute 0.3 0.1 - 1.0 K/uL   Eosinophils Relative 1 %   Eosinophils Absolute 0.0 0.0 - 0.7 K/uL   Basophils Relative 0 %   Basophils Absolute 0.0 0.0 - 0.1 K/uL  Basic metabolic panel  Result Value Ref Range   Sodium 137 135 - 145 mmol/L   Potassium 4.2 3.5 - 5.1 mmol/L   Chloride 101 101 - 111 mmol/L   CO2 28 22 - 32 mmol/L   Glucose, Bld 265 (H) 65 - 99 mg/dL   BUN 15 6 - 20 mg/dL   Creatinine, Ser 0.80 0.44 - 1.00 mg/dL   Calcium 9.0 8.9 - 10.3 mg/dL   GFR calc non Af Amer >60 >60 mL/min    GFR calc Af Amer >60 >60 mL/min  Anion gap 8 5 - 15  Brain natriuretic peptide  Result Value Ref Range   B Natriuretic Peptide 51.8 0.0 - 100.0 pg/mL  CBG monitoring, ED  Result Value Ref Range   Glucose-Capillary 254 (H) 65 - 99 mg/dL   Dg Chest 2 View  Result Date: 06/04/2016 CLINICAL DATA:  Dyspnea for 2 days. EXAM: CHEST  2 VIEW COMPARISON:  02/24/2016 FINDINGS: Borderline cardiomegaly. The lungs are clear. There is no pleural effusion. Pulmonary vasculature is normal. Hilar and mediastinal contours are unremarkable and unchanged. IMPRESSION: Borderline cardiomegaly. No consolidation. Normal vasculature. No effusions. Electronically Signed   By: Andreas Newport M.D.   On: 06/04/2016 23:31    ED Treatments / Results  Labs (all labs ordered are listed, but only abnormal results are displayed) Labs Reviewed  CBG MONITORING, ED - Abnormal; Notable for the following:       Result Value   Glucose-Capillary 254 (*)    All other components within normal limits    EKG  EKG Interpretation  Date/Time:  Thursday June 04 2016 22:12:47 EST Ventricular Rate:  89 PR Interval:    QRS Duration: 76 QT Interval:  343 QTC Calculation: 418 R Axis:     Text Interpretation:  Sinus rhythm Nonspecific T abnormalities, lateral leads No significant change since last tracing Confirmed by Winfred Leeds  MD, Coden Franchi [phone]) on 06/04/2016 11:42:32 PM       Radiology No results found. Chest x-ray viewed by me Procedures Procedures (including critical care time)  Medications Ordered in ED Medications - No data to display   Initial Impression / Assessment and Plan / ED Course  I have reviewed the triage vital signs and the nursing notes.  Pertinent labs & imaging results that were available during my care of the patient were reviewed by me and considered in my medical decision making (see chart for details).  Clinical Course    11 30 p.m. patient developed stridorous respirations while getting  in ipratropium nebulized treatment. She states that she gets stridorous from time to time due to "vocal cord dysfunction" which she has had with nebulized albuterol in the past. She sometimes gets stridorous respirations for no particular reason she states it typically resolves after approximately 10 minutes. Nebulized treatment was discontinued 11:45 PM patient breathing normally. Stridor has resolved 12:50 AM patient is breathing normally.. She is able to ambulate without difficulty or dyspnea. Pulse oximetry on room air 94%, normal. Lungs clear auscultation speaks in paragraphs she feels ready to go home. Plan home observation. Steroids would not be beneficial in this patient as she is diabetic and will likely elevate her blood sugar. She does not do well with nebulized treatments and she reports that she hasn't done well with inhalers in the past. Final Clinical Impressions(s) / ED Diagnoses  Diagnosis #1 acute bronchitis #2 hyperglycemia Final diagnoses:  None    New Prescriptions New Prescriptions   No medications on file     Orlie Dakin, MD 06/05/16 [phone]

## 2016-06-05 ENCOUNTER — Telehealth: Payer: Self-pay | Admitting: *Deleted

## 2016-06-05 LAB — BASIC METABOLIC PANEL
ANION GAP: 8 (ref 5–15)
BUN: 15 mg/dL (ref 6–20)
CALCIUM: 9 mg/dL (ref 8.9–10.3)
CHLORIDE: 101 mmol/L (ref 101–111)
CO2: 28 mmol/L (ref 22–32)
Creatinine, Ser: 0.8 mg/dL (ref 0.44–1.00)
GFR calc Af Amer: >60 mL/min (ref >60)
GFR calc non Af Amer: >60 mL/min (ref >60)
GLUCOSE: 265 mg/dL — AB (ref 65–99)
POTASSIUM: 4.2 mmol/L (ref 3.5–5.1)
Sodium: 137 mmol/L (ref 135–145)

## 2016-06-05 LAB — BRAIN NATRIURETIC PEPTIDE: B Natriuretic Peptide: 51.8 pg/mL (ref 0.0–100.0)

## 2016-06-05 NOTE — Telephone Encounter (Signed)
Dr. Rolly Salter patient called and stated that she went to the ER last night and was diagnosed with Acute Bronchitis. Patient has a cough and wants something called in to suppress it. Stated that in the past Endal was prescribed but she doesn't think they make it anymore. Would like something like that because it worked well. Please Advise.

## 2016-06-05 NOTE — Discharge Instructions (Signed)
Blood sugar tonight was mildly elevated at 265. Call Dr. Nyoka Cowden to arrange to be seen if not feeling better by next week. Return if concern for any reason

## 2016-06-05 NOTE — ED Notes (Signed)
Patient ambulated in room with RN and MD at bedside. Patient steady on feet and o2 sat remained at 94% on RA.

## 2016-06-05 NOTE — Telephone Encounter (Signed)
Ok for [employer] 200mg  #30 take 1 po TID prn cough with 1 RF

## 2016-06-08 MED ORDER — DOXYCYCLINE HYCLATE 100 MG PO TABS: ORAL_TABLET | ORAL | 0 refills | Status: DC

## 2016-06-08 NOTE — Telephone Encounter (Signed)
Rx: Doxycycline 100 mg (20 tabs) One twice daily to treat infection

## 2016-06-08 NOTE — Telephone Encounter (Signed)
Patient notified and agreed. Faxed Rx to pharmacy.  

## 2016-06-08 NOTE — Telephone Encounter (Signed)
Patient stated that she cannot take the Tessalon perles because she is allergic. Coughing up brown mucus and went to the ER but they stated for patient to contact primary.They diagnosed her with acute bronchitis.  Patient is requesting a antibiotic. Please Advise.

## 2016-06-11 ENCOUNTER — Ambulatory Visit (INDEPENDENT_AMBULATORY_CARE_PROVIDER_SITE_OTHER): Payer: Medicare Other | Admitting: Nurse Practitioner

## 2016-06-11 ENCOUNTER — Encounter (HOSPITAL_COMMUNITY): Payer: Self-pay

## 2016-06-11 ENCOUNTER — Encounter: Payer: Self-pay | Admitting: Nurse Practitioner

## 2016-06-11 ENCOUNTER — Emergency Department (HOSPITAL_COMMUNITY): Payer: Medicare Other

## 2016-06-11 ENCOUNTER — Emergency Department (HOSPITAL_COMMUNITY)
Admission: EM | Admit: 2016-06-11 | Discharge: 2016-06-11 | Disposition: A | Discharge status: Home / Self Care | Payer: Medicare Other | Attending: Emergency Medicine | Admitting: Emergency Medicine

## 2016-06-11 VITALS — BP 155/100 | HR 60 | Temp 97.6°F | Resp 19 | Ht 62.0 in | Wt 193.8 lb

## 2016-06-11 DIAGNOSIS — R0602 Shortness of breath: Secondary | ICD-10-CM

## 2016-06-11 DIAGNOSIS — R061 Stridor: Secondary | ICD-10-CM

## 2016-06-11 DIAGNOSIS — Z5321 Procedure and treatment not carried out due to patient leaving prior to being seen by health care provider: Secondary | ICD-10-CM | POA: Insufficient documentation

## 2016-06-11 DIAGNOSIS — F411 Generalized anxiety disorder: Secondary | ICD-10-CM

## 2016-06-11 DIAGNOSIS — R05 Cough: Secondary | ICD-10-CM | POA: Diagnosis not present

## 2016-06-11 DIAGNOSIS — R0789 Other chest pain: Secondary | ICD-10-CM | POA: Diagnosis not present

## 2016-06-11 LAB — CBG MONITORING, ED: GLUCOSE-CAPILLARY: 135 mg/dL — AB (ref 65–99)

## 2016-06-11 NOTE — ED Notes (Signed)
Pt stated she did not want to wait any longer and her husband came and picked her up
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
## 2016-06-11 NOTE — ED Triage Notes (Addendum)
Per EMS, Pt, sent from PCP, c/o productive cough and chest wall pain r/t bronchitis Dx x 3 weeks and anxiety r/t caregiver strain starting recently. Pain score 6/10 only w/ coughing.  Pt did not complain about chest pain w/ EMS, but mentioned it to PCP.  Hx of vocal cord dysfunction which increases w/ anxiety.  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  Pt able to speak full sentences.

## 2016-06-12 ENCOUNTER — Telehealth: Payer: Self-pay | Admitting: *Deleted

## 2016-06-12 NOTE — Telephone Encounter (Signed)
Patient was sent by ambulance yesterday from office to hospital. I called patient to check on her today and she stated that she had a chest x-ray done and her vitals checked and they wheeled her into the ER Waiting room with all the other sick people and she waited for hours. She stated that her blood sugar started dropping and they would not give her any crackers or drink. She stated that she had to leave. She was wheeled out of the hospital by a nurse and a ride picked her up. Patient wants to know the results of the chest x-ray done and if there is anything else we can do for her. Still feels terrible. Please Advise.

## 2016-06-12 NOTE — Telephone Encounter (Signed)
Patient returned call and schedule appointment to follow-up on Monday

## 2016-06-12 NOTE — Telephone Encounter (Signed)
Left message on voicemail with Jessica's instructions. I instructed patient to call to schedule follow-up on Monday

## 2016-06-12 NOTE — Telephone Encounter (Signed)
Could not exclude infiltrates on chest xray which could indicate pneumonia, pt is currently taking doxycycline which she should continue full course and will help this. If symptoms worsening will need further evaluation sooner but she can follow up in office on Monday.

## 2016-06-15 ENCOUNTER — Encounter: Payer: Self-pay | Admitting: Nurse Practitioner

## 2016-06-15 ENCOUNTER — Ambulatory Visit (INDEPENDENT_AMBULATORY_CARE_PROVIDER_SITE_OTHER): Payer: Medicare Other | Admitting: Nurse Practitioner

## 2016-06-15 VITALS — BP 150/86 | HR 85 | Temp 97.8°F | Resp 18

## 2016-06-15 DIAGNOSIS — F411 Generalized anxiety disorder: Secondary | ICD-10-CM

## 2016-06-15 DIAGNOSIS — J069 Acute upper respiratory infection, unspecified: Secondary | ICD-10-CM

## 2016-06-15 MED ORDER — AMOXICILLIN 500 MG PO CAPS: 1000.0000 mg | ORAL_CAPSULE | Freq: Three times a day (TID) | ORAL | 0 refills | Status: DC

## 2016-06-15 NOTE — Patient Instructions (Addendum)
Keep appt with Dr Nyoka Cowden next week.  Good nutrition and good hydration Amoxicillin 2 tablets every 8 hours for 5 days.  mucinex DM 1 tablet by mouth twice daily with full glass of water for 1 week (OTC) Take probiotic twice daily (this is OTC) Yogurt daily nettipot twice daily Plain nasal saline to nares as needed throughout the day for congestion   Upper Respiratory Infection, Adult Most upper respiratory infections (URIs) are a viral infection of the air passages leading to the lungs. A URI affects the nose, throat, and upper air passages. The most common type of URI is nasopharyngitis and is typically referred to as "the common cold." URIs run their course and usually go away on their own. Most of the time, a URI does not require medical attention, but sometimes a bacterial infection in the upper airways can follow a viral infection. This is called a secondary infection. Sinus and middle ear infections are common types of secondary upper respiratory infections. Bacterial pneumonia can also complicate a URI. A URI can worsen asthma and chronic obstructive pulmonary disease (COPD). Sometimes, these complications can require emergency medical care and may be life threatening. What are the causes? Almost all URIs are caused by viruses. A virus is a type of germ and can spread from one person to another. What increases the risk? You may be at risk for a URI if:  You smoke.  You have chronic heart or lung disease.  You have a weakened defense (immune) system.  You are very young or very old.  You have nasal allergies or asthma.  You work in crowded or poorly ventilated areas.  You work in health care facilities or schools. What are the signs or symptoms? Symptoms typically develop 2-3 days after you come in contact with a cold virus. Most viral URIs last 7-10 days. However, viral URIs from the influenza virus (flu virus) can last 14-18 days and are typically more severe. Symptoms may  include:  Runny or stuffy (congested) nose.  Sneezing.  Cough.  Sore throat.  Headache.  Fatigue.  Fever.  Loss of appetite.  Pain in your forehead, behind your eyes, and over your cheekbones (sinus pain).  Muscle aches. How is this diagnosed? Your health care provider may diagnose a URI by:  Physical exam.  Tests to check that your symptoms are not due to another condition such as:  Strep throat.  Sinusitis.  Pneumonia.  Asthma. How is this treated? A URI goes away on its own with time. It cannot be cured with medicines, but medicines may be prescribed or recommended to relieve symptoms. Medicines may help:  Reduce your fever.  Reduce your cough.  Relieve nasal congestion. Follow these instructions at home:  Take medicines only as directed by your health care provider.  Gargle warm saltwater or take cough drops to comfort your throat as directed by your health care provider.  Use a warm mist humidifier or inhale steam from a shower to increase air moisture. This may make it easier to breathe.  Drink enough fluid to keep your urine clear or pale yellow.  Eat soups and other clear broths and maintain good nutrition.  Rest as needed.  Return to work when your temperature has returned to normal or as your health care provider advises. You may need to stay home longer to avoid infecting others. You can also use a face mask and careful hand washing to prevent spread of the virus.  Increase the usage of your  inhaler if you have asthma.  Do not use any tobacco products, including cigarettes, chewing tobacco, or electronic cigarettes. If you need help quitting, ask your health care provider. How is this prevented? The best way to protect yourself from getting a cold is to practice good hygiene.  Avoid oral or hand contact with people with cold symptoms.  Wash your hands often if contact occurs. There is no clear evidence that vitamin C, vitamin E,  echinacea, or exercise reduces the chance of developing a cold. However, it is always recommended to get plenty of rest, exercise, and practice good nutrition. Contact a health care provider if:  You are getting worse rather than better.  Your symptoms are not controlled by medicine.  You have chills.  You have worsening shortness of breath.  You have brown or red mucus.  You have yellow or brown nasal discharge.  You have pain in your face, especially when you bend forward.  You have a fever.  You have swollen neck glands.  You have pain while swallowing.  You have white areas in the back of your throat. Get help right away if:  You have severe or persistent:  Headache.  Ear pain.  Sinus pain.  Chest pain.  You have chronic lung disease and any of the following:  Wheezing.  Prolonged cough.  Coughing up blood.  A change in your usual mucus.  You have a stiff neck.  You have changes in your:  Vision.  Hearing.  Thinking.  Mood. This information is not intended to replace advice given to you by your health care provider. Make sure you discuss any questions you have with your health care provider. Document Released: 11/11/2000 Document Revised: 01/19/2016 Document Reviewed: 08/23/2013 Elsevier Interactive Patient Education  2017 [employer].

## 2016-06-15 NOTE — Progress Notes (Signed)
Careteam: Patient Care Team: [name], MD as PCP - General (Internal Medicine) Excell Seltzer, MD as Consulting Physician (General Surgery) Tanda Rockers, MD as Consulting Physician (Pulmonary Disease) Ronald Lobo, MD as Consulting Physician (Gastroenterology) Jacolyn Reedy, MD as Consulting Physician (Cardiology)  Advanced Directive information Does Patient Have a Medical Advance Directive?: No  Allergies  Allergen Reactions  . Ciprofloxacin Rash  . Advair Diskus [Fluticasone-Salmeterol] Other (See Comments)    Throat closes  . Albuterol     REACTION: closes throat  . Alendronate Sodium Itching  . Benadryl [Diphenhydramine Hcl] Itching  . Benzonatate     REACTION: rash/hives  . Cephalexin Nausea Only  . Gabapentin Other (See Comments)    Loss of memory  . Iohexol      Desc: rash and DIF BREATHING   . Keflex [Cephalexin] Nausea And Vomiting  . Morphine Nausea And Vomiting  . Other Other (See Comments)    Decongestants- keeps pt awake at night, increased heart rate  . Propoxyphene Hcl     Doesn't recall  . Reclast [Zoledronic Acid] Other (See Comments)    Chest pain  . Avapro [Irbesartan] Rash  . Codeine Nausea And Vomiting, Swelling and Rash  . Tessalon Perles Rash  . Tramadol Nausea Only and Rash    Chief Complaint  Patient presents with  . Medical Management of Chronic Issues    Follow up from ED visit on 06/11/2016     HPI: Patient is a 76 y.o. female seen in the office today to follow up from the ED. Still coughing and feels like lungs are "crackling" Feels like the infection is not gone. Still taking doxycycline. Has 2 additional days of this medication. Taking OTC tussin-[facility] brand, taking 1/2 dose.  Productive cough, yellow tint sputum.  Not drinking enough water.  Takes prednisone 5 mg daily due to chronic hip and back pain per pt.  Feeling fullness in her sinuses too.  No headache. No fever. No N/V/diarrhea.  No shortness of  breath.  Reports worsening anxiety but did not want to talk about it in front of her husband who was present during the visit.   Review of Systems:  Review of Systems  Constitutional: Positive for fatigue. Negative for chills and fever.  HENT: Positive for congestion (chest) and sinus pressure. Negative for postnasal drip, rhinorrhea, sinus pain, sneezing and sore throat.   Respiratory: Positive for cough (brown sputum). Negative for shortness of breath and wheezing.   Cardiovascular: Negative for chest pain and palpitations.  Gastrointestinal: Negative for abdominal pain, diarrhea, nausea and vomiting.  Musculoskeletal: Negative for myalgias.  Neurological: Positive for weakness. Negative for dizziness, light-headedness and headaches.  Psychiatric/Behavioral: The patient is nervous/anxious.     Past Medical History:  Diagnosis Date  . Abdominal pain, unspecified site   . Abnormality of gait   . Apnea   . Cardiomegaly   . Chest pain, unspecified   . Complication of anesthesia    hard time waking up  . Depressive disorder, not elsewhere classified   . Diabetes mellitus   . Dizziness and giddiness   . Dyskinesia of esophagus   . Edema   . Extrinsic asthma, unspecified   . Female stress incontinence   . Gout, unspecified   . High cholesterol   . Hypertension   . Lipoma of other skin and subcutaneous tissue   . Lumbago   . Memory loss   . Migraine without aura, with intractable migraine, so stated,  with status migrainosus   . Mild cognitive impairment, so stated   . Nonspecific (abnormal) findings on radiological and other examination of abdominal area, including retroperitoneum   . Nonspecific abnormal results of liver function study   . Obesity, unspecified   . Obstructive chronic bronchitis with exacerbation ([place])   . Other and unspecified hyperlipidemia   . Other B-complex deficiencies   . Other nonspecific abnormal serum enzyme levels   . Other specified cardiac  dysrhythmias(427.89)   . Other specified disease of sebaceous glands   . Other symptoms involving cardiovascular system   . Pain in joint, ankle and foot   . Pain in joint, pelvic region and thigh   . Pain in joint, shoulder region   . Palpitations   . Reflux esophagitis   . Shortness of breath   . Tension headache   . Type I (juvenile type) diabetes mellitus without mention of complication, not stated as uncontrolled   . Type I (juvenile type) diabetes mellitus without mention of complication, uncontrolled   . Unspecified essential hypertension   . Unspecified hypothyroidism   . Unspecified vitamin D deficiency   . Ventral hernia, unspecified, without mention of obstruction or gangrene    Past Surgical History:  Procedure Laterality Date  . ABDOMINAL HYSTERECTOMY    . APPENDECTOMY    . BREAST SURGERY    . EXPLORATORY LAPAROTOMY W/ BOWEL RESECTION  07/25/2004   PROCEDURE: Laparoscopy, open laparotomy, resection of jejunojejunostomy  . INCISIONAL HERNIA REPAIR  03/19/2006   PROCEDURE: Open ventral hernia repair with mesh.  Marland Kitchen LAPAROSCOPIC GASTRIC BYPASS  07/22/2004   PROCEDURE: Laparoscopic Roux-en-Y gastric bypass, antecolic, antegastric,  . LAPAROSCOPIC INCISIONAL / UMBILICAL / VENTRAL HERNIA REPAIR  09/22/2005   PROCEDURE: Laparoscopic ventral hernia repair with mesh.  Marland Kitchen LEFT HEART CATHETERIZATION WITH CORONARY ANGIOGRAM N/A 08/21/2014   Procedure: LEFT HEART CATHETERIZATION WITH CORONARY ANGIOGRAM;  Surgeon: Sherren Mocha, MD;  Location: [facility] CATH LAB;  Service: Cardiovascular;  Laterality: N/A;  . stents     last time she said was 1 yr ago    Social History:   reports that she has never smoked. She has never used smokeless tobacco. She reports that she does not drink alcohol or use drugs.  Family History  Problem Relation Age of Onset  . Stroke Mother   . Stroke Father   . Diabetes Father   . Heart disease Father   . Diabetes Son   . Cancer Brother     BLADDER  .  Diabetes Brother     Medications: Patient's Medications  New Prescriptions   No medications on file  Previous Medications   AMLODIPINE (NORVASC) 2.5 MG TABLET    TAKE 1 TABLET (2.5 MG TOTAL) BY MOUTH DAILY.   ASPIRIN 81 MG TABLET    Take 81 mg by mouth daily.    CETIRIZINE (ZYRTEC) 10 MG TABLET    Take 10 mg by mouth daily as needed for allergies. For allergies   CHOLECALCIFEROL (VITAMIN D3) 5000 UNITS CAPS    Take 1 tablet by mouth daily. For supplement   CINNAMON PO    Take 1,000 mg by mouth daily.   CITALOPRAM (CELEXA) 40 MG TABLET    TAKE 1 TABLET (40 MG TOTAL) BY MOUTH DAILY. 1 BY MOUTH DAILY FOR DEPRESSION   DOCUSATE SODIUM (COLACE) 100 MG CAPSULE    Take 1 capsule (100 mg total) by mouth 2 (two) times daily.   DOXYCYCLINE (VIBRA-TABS) 100 MG TABLET    Take  one tablet by mouth twice daily to treat infection   GLIPIZIDE (GLUCOTROL XL) 10 MG 24 HR TABLET    TAKE ONE TABLET BY MOUTH IN THE MORNING AND ONE TABLET IN THE EVENING TO CONTROL BLOOD SUGAR   HUMULIN N 100 UNIT/ML INJECTION    INJECT 33 UNITS IN THE MORNING AND 33 UNITS IN THE EVENING TO CONTROL BLOOD SUGAR   LOSARTAN-HYDROCHLOROTHIAZIDE (HYZAAR) 50-12.5 MG TABLET    TAKE ONE TABLET BY MOUTH ONCE DAILY TO CONTROL BLOOD PRESSURE   MAGNESIUM OXIDE 250 MG TABS    Take 500 mg by mouth daily.    NITROGLYCERIN (NITROSTAT) 0.4 MG SL TABLET    Place 1 tablet (0.4 mg total) under the tongue every 5 (five) minutes as needed. For chest pain. Maximum of 3 tablets in 15 minutes.   OMEPRAZOLE (PRILOSEC) 40 MG CAPSULE    Take 40 mg by mouth daily.   PREDNISONE (DELTASONE) 5 MG TABLET    One each morning to reduce pain   SENNA-DOCUSATE (SENOKOT-S) 8.6-50 MG TABLET    Take 1 tablet by mouth daily.  Modified Medications   No medications on file  Discontinued Medications   No medications on file     Physical Exam:  Vitals:   06/15/16 0915  BP: (!) 150/86  Pulse: 85  Resp: 18  Temp: 97.8 F (36.6 C)  TempSrc: Oral  SpO2: 96%    There is no height or weight on file to calculate BMI.  Physical Exam  Constitutional: She is oriented to person, place, and time. She appears well-developed and well-nourished.  HENT:  Head: Normocephalic and atraumatic.  Eyes: Conjunctivae and EOM are normal. Pupils are equal, round, and reactive to light.  Neck: Normal range of motion. Neck supple. No thyromegaly present.  Cardiovascular: Normal rate, regular rhythm and normal heart sounds.   Pulmonary/Chest: Effort normal and breath sounds normal. No respiratory distress. She has no wheezes. She has no rales. She exhibits no tenderness.  Abdominal: Soft. Bowel sounds are normal.  Musculoskeletal: Normal range of motion. She exhibits no edema or tenderness.  Lymphadenopathy:    She has no cervical adenopathy.  Neurological: She is alert and oriented to person, place, and time.  Skin: Skin is warm and dry. No erythema.  Psychiatric: Judgment and thought content normal.  Depressed. Seems fearful of husband.    Labs reviewed: Basic Metabolic Panel:  Recent Labs  08/07/15 0854 01/20/16 1044 01/20/16 1048 02/24/16 0951 06/04/16 2343  NA 140 140  --  138 137  K 4.2 4.4  --  3.8 4.2  CL 102 102  --  100 101  CO2 24 29  --  27 28  GLUCOSE 135* 149*  --  286* 265*  BUN 17 21  --  16 15  CREATININE 0.79 0.96*  --  0.93 0.80  CALCIUM 8.8 9.2  --  8.9 9.0  TSH 2.850  --  1.90  --   --    Liver Function Tests:  Recent Labs  08/07/15 0854 01/20/16 1044  AST 13 16  ALT 12 14  ALKPHOS 99 80  BILITOT 0.5 0.6  PROT 6.2 6.2  ALBUMIN 3.7 3.7   No results for input(s): LIPASE, AMYLASE in the last 8760 hours. No results for input(s): AMMONIA in the last 8760 hours. CBC:  Recent Labs  02/24/16 0951 06/04/16 2343  WBC 8.3 7.5  NEUTROABS  --  6.2  HGB 11.3* 12.0  HCT 36.8 38.5  MCV 83.1 86.1  PLT 369 311   Lipid Panel:  Recent Labs  08/07/15 0854 01/20/16 1044  CHOL 215* 198  HDL 45 43*  LDLCALC 132* 118   TRIG 191* 183*  CHOLHDL 4.8* 4.6   TSH:  Recent Labs  08/07/15 0854 01/20/16 1048  TSH 2.850 1.90   A1C: Lab Results  Component Value Date   HGBA1C 7.8 (H) 01/20/2016     Assessment/Plan 1. Acute upper respiratory infection Improved but still feeling poorly, symptoms for 4 weeks  -Amoxicillin 2 tablets every 8 hours for 5 days.  mucinex DM 1 tablet by mouth twice daily with full glass of water for 1 week (OTC) Take probiotic twice daily (this is OTC) Yogurt daily nettipot twice daily Plain nasal saline to nares as needed throughout the day for congestion Good nutrition and good hydration  2. Anxiety state -cont on celexa 40 mg daily for anxiety however with increase anxiety and depression. Does not wish to go into details today however Recommended follow up with psychologist/psychiatrist center, numbers provided for pt to make appt   To keep follow up with Dr Nyoka Cowden next week- seek medical attention if symptoms worsening before appt.  Carlos American. Harle Battiest  [facility] [phone] 8 am - 5 pm) [phone] (after hours)

## 2016-06-18 ENCOUNTER — Other Ambulatory Visit: Payer: Medicare Other

## 2016-06-22 ENCOUNTER — Other Ambulatory Visit: Payer: Medicare Other

## 2016-06-22 DIAGNOSIS — I10 Essential (primary) hypertension: Secondary | ICD-10-CM

## 2016-06-22 DIAGNOSIS — Z794 Long term (current) use of insulin: Principal | ICD-10-CM

## 2016-06-22 DIAGNOSIS — E113513 Type 2 diabetes mellitus with proliferative diabetic retinopathy with macular edema, bilateral: Secondary | ICD-10-CM

## 2016-06-22 LAB — HEMOGLOBIN A1C
HEMOGLOBIN A1C: 7.5 % — AB (ref <5.7)
MEAN PLASMA GLUCOSE: 169 mg/dL

## 2016-06-22 LAB — BASIC METABOLIC PANEL
BUN: 16 mg/dL (ref 7–25)
CALCIUM: 9 mg/dL (ref 8.6–10.4)
CO2: 30 mmol/L (ref 20–31)
CREATININE: 0.82 mg/dL (ref 0.60–0.93)
Chloride: 104 mmol/L (ref 98–110)
Glucose, Bld: 76 mg/dL (ref 65–99)
Potassium: 4.3 mmol/L (ref 3.5–5.3)
Sodium: 140 mmol/L (ref 135–146)

## 2016-06-23 ENCOUNTER — Encounter: Payer: Self-pay | Admitting: Internal Medicine

## 2016-06-23 ENCOUNTER — Ambulatory Visit (INDEPENDENT_AMBULATORY_CARE_PROVIDER_SITE_OTHER): Payer: Medicare Other | Admitting: Internal Medicine

## 2016-06-23 ENCOUNTER — Ambulatory Visit: Payer: Medicare Other

## 2016-06-23 ENCOUNTER — Telehealth: Payer: Self-pay

## 2016-06-23 VITALS — BP 120/72 | HR 55 | Temp 98.0°F | Ht 62.0 in

## 2016-06-23 DIAGNOSIS — E669 Obesity, unspecified: Secondary | ICD-10-CM | POA: Diagnosis not present

## 2016-06-23 DIAGNOSIS — R0609 Other forms of dyspnea: Secondary | ICD-10-CM

## 2016-06-23 DIAGNOSIS — F332 Major depressive disorder, recurrent severe without psychotic features: Secondary | ICD-10-CM | POA: Diagnosis not present

## 2016-06-23 DIAGNOSIS — E039 Hypothyroidism, unspecified: Secondary | ICD-10-CM | POA: Diagnosis not present

## 2016-06-23 DIAGNOSIS — Z630 Problems in relationship with spouse or partner: Secondary | ICD-10-CM

## 2016-06-23 DIAGNOSIS — I10 Essential (primary) hypertension: Secondary | ICD-10-CM | POA: Diagnosis not present

## 2016-06-23 DIAGNOSIS — Z794 Long term (current) use of insulin: Secondary | ICD-10-CM

## 2016-06-23 DIAGNOSIS — E113513 Type 2 diabetes mellitus with proliferative diabetic retinopathy with macular edema, bilateral: Secondary | ICD-10-CM | POA: Diagnosis not present

## 2016-06-23 DIAGNOSIS — I25119 Atherosclerotic heart disease of native coronary artery with unspecified angina pectoris: Secondary | ICD-10-CM

## 2016-06-23 DIAGNOSIS — I209 Angina pectoris, unspecified: Secondary | ICD-10-CM | POA: Diagnosis not present

## 2016-06-23 DIAGNOSIS — J22 Unspecified acute lower respiratory infection: Secondary | ICD-10-CM | POA: Diagnosis not present
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
## 2016-06-23 NOTE — Patient Instructions (Signed)
Use Mucinex DM or Mucinex Fast Max

## 2016-06-23 NOTE — Telephone Encounter (Signed)
Faxed to [phone] form, Rx, office note, insurance information.

## 2016-06-25 ENCOUNTER — Telehealth: Payer: Self-pay

## 2016-06-25 NOTE — Telephone Encounter (Signed)
Message left on clinical intake voicemail:   Patient does not qualify for oxygen, patient has to drop below 88 % . Please place overnight o2 order in epic and they will be able to pull order @ [facility]. Order needs to be signed by Dr.Green.  Please return call also   Order is pending, Dr.Green please sign and forward back to me to inform Leafy Ro with [facility] that order is complete

## 2016-06-26 ENCOUNTER — Other Ambulatory Visit: Payer: Self-pay | Admitting: Internal Medicine

## 2016-06-26 DIAGNOSIS — J4541 Moderate persistent asthma with (acute) exacerbation: Secondary | ICD-10-CM

## 2016-06-26 DIAGNOSIS — R0609 Other forms of dyspnea: Principal | ICD-10-CM

## 2016-06-26 DIAGNOSIS — I25119 Atherosclerotic heart disease of native coronary artery with unspecified angina pectoris: Secondary | ICD-10-CM

## 2016-06-26 NOTE — Telephone Encounter (Signed)
Orders entered

## 2016-06-26 NOTE — Telephone Encounter (Signed)
Mandy with [facility] Notified. She will pull them from [facility].

## 2016-07-08 ENCOUNTER — Ambulatory Visit (INDEPENDENT_AMBULATORY_CARE_PROVIDER_SITE_OTHER): Payer: Medicare Other | Admitting: Internal Medicine

## 2016-07-08 ENCOUNTER — Encounter: Payer: Self-pay | Admitting: Internal Medicine

## 2016-07-08 VITALS — BP 122/78 | HR 80 | Temp 97.7°F | Ht 62.0 in | Wt 195.0 lb

## 2016-07-08 DIAGNOSIS — Z794 Long term (current) use of insulin: Secondary | ICD-10-CM

## 2016-07-08 DIAGNOSIS — I25119 Atherosclerotic heart disease of native coronary artery with unspecified angina pectoris: Secondary | ICD-10-CM | POA: Diagnosis not present

## 2016-07-08 DIAGNOSIS — J22 Unspecified acute lower respiratory infection: Secondary | ICD-10-CM

## 2016-07-08 DIAGNOSIS — I209 Angina pectoris, unspecified: Secondary | ICD-10-CM

## 2016-07-08 DIAGNOSIS — E039 Hypothyroidism, unspecified: Secondary | ICD-10-CM | POA: Diagnosis not present

## 2016-07-08 DIAGNOSIS — E669 Obesity, unspecified: Secondary | ICD-10-CM

## 2016-07-08 DIAGNOSIS — E785 Hyperlipidemia, unspecified: Secondary | ICD-10-CM

## 2016-07-08 DIAGNOSIS — I10 Essential (primary) hypertension: Secondary | ICD-10-CM | POA: Diagnosis not present

## 2016-07-08 DIAGNOSIS — E113513 Type 2 diabetes mellitus with proliferative diabetic retinopathy with macular edema, bilateral: Secondary | ICD-10-CM

## 2016-07-08 NOTE — Progress Notes (Signed)
Facility  [facility]    Place of Service:   OFFICE    Allergies  Allergen Reactions  . Ciprofloxacin Rash  . Advair Diskus [Fluticasone-Salmeterol] Other (See Comments)    Throat closes  . Albuterol     REACTION: closes throat  . Alendronate Sodium Itching  . Benadryl [Diphenhydramine Hcl] Itching  . Benzonatate     REACTION: rash/hives  . Cephalexin Nausea Only  . Gabapentin Other (See Comments)    Loss of memory  . Iohexol      Desc: rash and DIF BREATHING   . Keflex [Cephalexin] Nausea And Vomiting  . Morphine Nausea And Vomiting  . Other Other (See Comments)    Decongestants- keeps pt awake at night, increased heart rate  . Propoxyphene Hcl     Doesn't recall  . Reclast [Zoledronic Acid] Other (See Comments)    Chest pain  . Avapro [Irbesartan] Rash  . Codeine Nausea And Vomiting, Swelling and Rash  . Tessalon Perles Rash  . Tramadol Nausea Only and Rash    Chief Complaint  Patient presents with  . Acute Visit    Two week follow up Respiratory infection    HPI:  Seen 06/23/16 for respiratory illness and dyspnea. O2 was prescribed.  Using CBD oil and it seems to help her pains.  Using cinnamon to help diabetic control.  Type 2 diabetes mellitus with both eyes affected by proliferative retinopathy and macular edema, with long-term current use of insulin (HCC)  Essential hypertension, benign  Coronary artery disease involving native coronary artery of native heart with angina pectoris (HCC)  Obesity (BMI 30-39.9)  Hyperlipidemia, unspecified hyperlipidemia type  Hypothyroidism, unspecified type  Acute respiratory infection    Medications: Patient's Medications  New Prescriptions   No medications on file  Previous Medications   AMLODIPINE (NORVASC) 2.5 MG TABLET    TAKE 1 TABLET (2.5 MG TOTAL) BY MOUTH DAILY.   ASPIRIN 81 MG TABLET    Take 81 mg by mouth daily.    CETIRIZINE (ZYRTEC) 10 MG TABLET    Take 10 mg by mouth daily as needed for  allergies. For allergies   CHOLECALCIFEROL (VITAMIN D3) 5000 UNITS CAPS    Take 1 tablet by mouth daily. For supplement   CINNAMON PO    Take 1,000 mg by mouth daily.   CITALOPRAM (CELEXA) 40 MG TABLET    TAKE 1 TABLET (40 MG TOTAL) BY MOUTH DAILY. 1 BY MOUTH DAILY FOR DEPRESSION   DOCUSATE SODIUM (COLACE) 100 MG CAPSULE    Take 1 capsule (100 mg total) by mouth 2 (two) times daily.   GLIPIZIDE (GLUCOTROL XL) 10 MG 24 HR TABLET    TAKE ONE TABLET BY MOUTH IN THE MORNING AND ONE TABLET IN THE EVENING TO CONTROL BLOOD SUGAR   HUMULIN N 100 UNIT/ML INJECTION    INJECT 33 UNITS IN THE MORNING AND 33 UNITS IN THE EVENING TO CONTROL BLOOD SUGAR   LOSARTAN-HYDROCHLOROTHIAZIDE (HYZAAR) 50-12.5 MG TABLET    TAKE ONE TABLET BY MOUTH ONCE DAILY TO CONTROL BLOOD PRESSURE   MAGNESIUM OXIDE 250 MG TABS    Take 500 mg by mouth daily.    NITROGLYCERIN (NITROSTAT) 0.4 MG SL TABLET    Place 1 tablet (0.4 mg total) under the tongue every 5 (five) minutes as needed. For chest pain. Maximum of 3 tablets in 15 minutes.   OMEPRAZOLE (PRILOSEC) 40 MG CAPSULE    Take 40 mg by mouth daily.   PREDNISONE (DELTASONE) 5  MG TABLET    One each morning to reduce pain   SENNA-DOCUSATE (SENOKOT-S) 8.6-50 MG TABLET    Take 1 tablet by mouth daily.  Modified Medications   No medications on file  Discontinued Medications   DOXYCYCLINE (VIBRA-TABS) 100 MG TABLET    Take one tablet by mouth twice daily to treat infection    Review of Systems  Constitutional: Positive for fatigue. Negative for diaphoresis, fever and unexpected weight change.       Obese. Patient had previous bypass stomach surgery, but has failed to sustain the weight that was lost after surgery.  HENT: Negative.   Eyes: Negative.   Respiratory: Negative.   Cardiovascular: Negative for chest pain, palpitations and leg swelling.  Gastrointestinal: Negative.   Endocrine:       Diabetic.  Genitourinary:       History of vaginal itching post antibiotic treatment.  Generally does better with antifungal tablet.  Musculoskeletal:       Complaints of muscular weakness. Degenerative joint disease symptoms are present. He has a history of gout. There is some restriction in joint motion and stiffness present in multiple joints. She has a chronic backache. She is unstable when walking.  Skin:       Dry, itchy skin.  Allergic/Immunologic: Negative.   Neurological:       Chronic amounts difficulties. Walks wobbly and unsteady. History of headaches including migraine headaches.  Hematological: Negative.   Psychiatric/Behavioral: Positive for dysphoric mood and sleep disturbance. The patient is nervous/anxious.        Depressive symptoms. History of memory problems. Increased stress. Nervous. Marriage is somewhat rocky.    Vitals:   07/08/16 1217  BP: 122/78  Pulse: 80  Temp: 97.7 F (36.5 C)  TempSrc: Oral  SpO2: 97%  Weight: 195 lb (88.5 kg)  Height: _0  (1.575 m)   Body mass index is 35.67 kg/m. Wt Readings from Last 3 Encounters:  07/08/16 195 lb (88.5 kg)  06/11/16 193 lb 12.8 oz (87.9 kg)  06/05/16 185 lb (83.9 kg)      Physical Exam  Constitutional: She is oriented to person, place, and time. She appears distressed.  Wheezy and mildly distressed.  HENT:  Head: Normocephalic and atraumatic.  Right Ear: External ear normal.  Left Ear: External ear normal.  Nose: Nose normal.  Mouth/Throat: Oropharynx is clear and moist.  Eyes: Conjunctivae and EOM are normal. Pupils are equal, round, and reactive to light.  Neck: No JVD present. No tracheal deviation present. No thyromegaly present.  Cardiovascular: Normal rate, regular rhythm, normal heart sounds and intact distal pulses.   Pulmonary/Chest: No respiratory distress. She has no wheezes. She has no rales. She exhibits no tenderness.  Abdominal: She exhibits no distension and no mass. There is no tenderness.  Musculoskeletal: She exhibits edema (tight 2-3 + bilaterally) and tenderness.    Unstable gait. Multiple joints are tender but do not appear to be inflamed. Lower back discomfort to palpation.  Lymphadenopathy:    She has no cervical adenopathy.  Neurological: She is alert and oriented to person, place, and time. She has normal reflexes. A cranial nerve deficit is present. Coordination normal.  Skin:  Single, soft, mobile nodule in the right supraclavicular area 6 mm x 10 mm diameter. Bruise left forearm about 14 days old in appearance.  Psychiatric: Judgment and thought content normal.  Seems depressed and anxious.    Labs reviewed: Lab Summary Latest Ref Rng & Units 06/22/2016 06/04/2016 02/24/2016 01/20/2016  Hemoglobin 12.0 - 15.0 g/dL (None) 12.0 11.3(L) (None)  Hematocrit 36.0 - 46.0 % (None) 38.5 36.8 (None)  White count 4.0 - 10.5 K/uL (None) 7.5 8.3 (None)  Platelet count 150 - 400 K/uL (None) 311 369 (None)  Sodium 135 - 146 mmol/L 140 137 138 140  Potassium 3.5 - 5.3 mmol/L 4.3 4.2 3.8 4.4  Calcium 8.6 - 10.4 mg/dL 9.0 9.0 8.9 9.2  Phosphorus - (None) (None) (None) (None)  Creatinine 0.60 - 0.93 mg/dL 0.82 0.80 0.93 0.96(H)  AST 10 - 35 U/L (None) (None) (None) 16  Alk Phos 33 - 130 U/L (None) (None) (None) 80  Bilirubin 0.2 - 1.2 mg/dL (None) (None) (None) 0.6  Glucose 65 - 99 mg/dL 76 265(H) 286(H) 149(H)  Cholesterol 125 - 200 mg/dL (None) (None) (None) 198  HDL cholesterol >=46 mg/dL (None) (None) (None) 43(L)  Triglycerides <150 mg/dL (None) (None) (None) 183(H)  LDL Direct - (None) (None) (None) (None)  LDL Calc <130 mg/dL (None) (None) (None) 118  Total protein 6.1 - 8.1 g/dL (None) (None) (None) 6.2  Albumin 3.6 - 5.1 g/dL (None) (None) (None) 3.7  Some recent data might be hidden   Lab Results  Component Value Date   TSH 1.90 01/20/2016   TSH 2.850 08/07/2015   TSH 1.930 11/29/2014   Lab Results  Component Value Date   BUN 16 06/22/2016   BUN 15 06/04/2016   BUN 16 02/24/2016   Lab Results  Component Value Date   HGBA1C 7.5 (H)  06/22/2016   HGBA1C 7.8 (H) 01/20/2016   HGBA1C 8.7 (H) 08/07/2015    Assessment/Plan 1. Type 2 diabetes mellitus with both eyes affected by proliferative retinopathy and macular edema, with long-term current use of insulin (HCC) The current medical regimen is effective;  continue present plan and medications. - Hemoglobin A1c; Future - Basic metabolic panel; Future - Microalbumin, urine; Future  2. Essential hypertension, benign The current medical regimen is effective;  continue present plan and medications. - Basic metabolic panel; Future  3. Coronary artery disease involving native coronary artery of native heart with angina pectoris ([place]) Asymptomatic and stable  4. Obesity (BMI 30-39.9) No weight loss  5. Hyperlipidemia, unspecified hyperlipidemia type - Lipid panel; Future  6. Hypothyroidism, unspecified type - TSH; Future  7. Acute respiratory infection Much improved

## 2016-08-01 ENCOUNTER — Other Ambulatory Visit: Payer: Self-pay | Admitting: Internal Medicine

## 2016-08-01 DIAGNOSIS — K2100 Gastro-esophageal reflux disease with esophagitis, without bleeding: Secondary | ICD-10-CM

## 2016-09-02 ENCOUNTER — Telehealth: Payer: Self-pay | Admitting: *Deleted

## 2016-09-02 NOTE — Telephone Encounter (Signed)
Received fax from [facility] [phone], through [facility], stating that patient has canceled. [facility] [phone] Placed in Dr. Hervey Ard folder to review.

## 2016-09-15 ENCOUNTER — Other Ambulatory Visit: Payer: Self-pay | Admitting: Internal Medicine

## 2016-09-16 ENCOUNTER — Telehealth: Payer: Self-pay | Admitting: *Deleted

## 2016-09-16 DIAGNOSIS — I208 Other forms of angina pectoris: Secondary | ICD-10-CM

## 2016-09-16 MED ORDER — NITROGLYCERIN 0.4 MG SL SUBL: 0.4000 mg | SUBLINGUAL_TABLET | SUBLINGUAL | 4 refills | Status: DC | PRN

## 2016-09-16 NOTE — Telephone Encounter (Signed)
Nitroglycerin Rx faxed to pharmacy. Handicap Placard filled out and placed in folder for Dr. Nyoka Cowden to sign. Patient notified and will pick up

## 2016-09-16 NOTE — Telephone Encounter (Signed)
Patient called requesting refill on her Nitroglycerin. (sent Rx to pharmacy) also wants a Handicap Placard form for herself filled out. Is this ok? She also requested one for her husband. Please Advise.

## 2016-09-16 NOTE — Telephone Encounter (Signed)
Ok the NTG and handicap parking for patient and husband.

## 2016-09-20 ENCOUNTER — Other Ambulatory Visit: Payer: Self-pay | Admitting: Internal Medicine

## 2016-10-09 ENCOUNTER — Telehealth: Payer: Self-pay | Admitting: Internal Medicine

## 2016-10-09 NOTE — Telephone Encounter (Signed)
I left a message asking the patient to call me at [phone] about the community resource referral. VDM (DD)

## 2016-10-12 ENCOUNTER — Other Ambulatory Visit: Payer: Self-pay | Admitting: Internal Medicine

## 2016-10-12 DIAGNOSIS — H543 Unqualified visual loss, both eyes: Principal | ICD-10-CM

## 2016-10-12 DIAGNOSIS — E113513 Type 2 diabetes mellitus with proliferative diabetic retinopathy with macular edema, bilateral: Secondary | ICD-10-CM

## 2016-10-19 ENCOUNTER — Ambulatory Visit (INDEPENDENT_AMBULATORY_CARE_PROVIDER_SITE_OTHER): Payer: Medicare Other | Admitting: Nurse Practitioner

## 2016-10-19 ENCOUNTER — Ambulatory Visit (HOSPITAL_COMMUNITY): Payer: Medicare Other | Attending: Cardiology

## 2016-10-19 ENCOUNTER — Telehealth: Payer: Self-pay | Admitting: Cardiovascular Disease

## 2016-10-19 ENCOUNTER — Other Ambulatory Visit: Payer: Self-pay

## 2016-10-19 ENCOUNTER — Encounter: Payer: Self-pay | Admitting: Nurse Practitioner

## 2016-10-19 VITALS — BP 130/80 | HR 94 | Ht 62.0 in | Wt 201.1 lb

## 2016-10-19 DIAGNOSIS — I5181 Takotsubo syndrome: Secondary | ICD-10-CM | POA: Diagnosis not present

## 2016-10-19 DIAGNOSIS — R0609 Other forms of dyspnea: Secondary | ICD-10-CM

## 2016-10-19 DIAGNOSIS — I313 Pericardial effusion (noninflammatory): Secondary | ICD-10-CM | POA: Diagnosis not present

## 2016-10-19 DIAGNOSIS — I071 Rheumatic tricuspid insufficiency: Secondary | ICD-10-CM | POA: Insufficient documentation

## 2016-10-19 DIAGNOSIS — R079 Chest pain, unspecified: Secondary | ICD-10-CM | POA: Diagnosis not present

## 2016-10-19 DIAGNOSIS — I208 Other forms of angina pectoris: Secondary | ICD-10-CM

## 2016-10-19 MED ORDER — ISOSORBIDE MONONITRATE ER 30 MG PO TB24: 30.0000 mg | ORAL_TABLET | Freq: Every day | ORAL | 3 refills | Status: DC
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
## 2016-10-19 NOTE — Patient Instructions (Addendum)
We will be checking the following labs today - NONE   Medication Instructions:    Continue with your current medicines. BUT  I am adding Imdur 30 mg to take one a day - this may cause some headache but it will go away after a few doses - ok to use the NTG under the tongue still if needed.     Testing/Procedures To Be Arranged:  N/A  Follow-Up:   See me in a month    Other Special Instructions:   Think about what we talked about today.     If you need a refill on your cardiac medications before your next appointment, please call your pharmacy.   Call the [place] office at [phone] if you have any questions, problems or concerns.

## 2016-10-19 NOTE — Telephone Encounter (Signed)
New message       Pt c/o of Chest Pain: STAT if CP now or developed within 24 hours  1. Are you having CP right now?  Not now but did have pain this am around 3am  2. Are you experiencing any other symptoms (ex. SOB, nausea, vomiting, sweating)?  Sob at times 3. How long have you been experiencing CP?  About 1 week but the pain is getting stronger 4. Is your CP continuous or coming and going?  Continuous   5. Have you taken Nitroglycerin?  ?yes, this am at 3

## 2016-10-19 NOTE — Telephone Encounter (Signed)
Pt states she usually has chest pain about once a week. Pt states for the last week she has had more frequent, usually 2 times a day,  longer lasting chest pain. Pt states chest pain usually relieved with 1 NTG, rarely it may take 2 NTG to relieve symptoms.  Pt states chest pain is usually at rest, last chest pain was 3 AM this morning, relieved with 1 NTG.  Pt states she has also had increase in shortness of breath with exertion and feeling like she cannot get a deep breath. Pt denies any other symptoms including nausea, lightheadedness, dizziness,palpitations. Pt does not know her heart rate or BP. Pt states she is asymptomatic at this time.

## 2016-10-19 NOTE — Telephone Encounter (Signed)
Dr Burt Knack would like echo to be done today prior to appt today with Lori--echo has been scheduled for 2 PM, appt with [facility] scheduled for 3 PM, pt aware.

## 2016-10-19 NOTE — Addendum Note (Signed)
Addended by: Katrine Coho on: 10/19/2016 11:50 AM   Modules accepted: Orders

## 2016-10-19 NOTE — Telephone Encounter (Signed)
Pt has been scheduled to see Truitt Merle, NP today at 2 PM. I have reviewed this with Dr Burt Knack.  Pt advised that she has been scheduled to see Cecille Rubin today at 2 PM, pt advised not to drive, pt states she does not have anyone to drive her to appointment. Pt advised if anymore symptoms at all call 911, she verbalized understanding.

## 2016-10-29 ENCOUNTER — Encounter: Payer: Self-pay | Admitting: Cardiovascular Disease

## 2016-10-29 NOTE — Telephone Encounter (Signed)
This encounter was created in error - please disregard.

## 2016-10-29 NOTE — Telephone Encounter (Signed)
Mrs.Fadeley is returning your call . Thanks

## 2016-11-03 DIAGNOSIS — H353111 Nonexudative age-related macular degeneration, right eye, early dry stage: Secondary | ICD-10-CM | POA: Diagnosis not present

## 2016-11-03 DIAGNOSIS — E113392 Type 2 diabetes mellitus with moderate nonproliferative diabetic retinopathy without macular edema, left eye: Secondary | ICD-10-CM | POA: Diagnosis not present

## 2016-11-03 DIAGNOSIS — H35372 Puckering of macula, left eye: Secondary | ICD-10-CM | POA: Diagnosis not present

## 2016-11-03 DIAGNOSIS — E113391 Type 2 diabetes mellitus with moderate nonproliferative diabetic retinopathy without macular edema, right eye: Secondary | ICD-10-CM | POA: Diagnosis not present

## 2016-11-04 ENCOUNTER — Other Ambulatory Visit: Payer: Medicare Other

## 2016-11-04 ENCOUNTER — Ambulatory Visit (INDEPENDENT_AMBULATORY_CARE_PROVIDER_SITE_OTHER): Payer: Medicare Other

## 2016-11-04 VITALS — BP 112/70 | HR 82 | Temp 98.2°F | Ht 62.0 in | Wt 202.0 lb

## 2016-11-04 DIAGNOSIS — E113513 Type 2 diabetes mellitus with proliferative diabetic retinopathy with macular edema, bilateral: Secondary | ICD-10-CM | POA: Diagnosis not present

## 2016-11-04 DIAGNOSIS — Z23 Encounter for immunization: Secondary | ICD-10-CM | POA: Diagnosis not present

## 2016-11-04 DIAGNOSIS — E039 Hypothyroidism, unspecified: Secondary | ICD-10-CM | POA: Diagnosis not present

## 2016-11-04 DIAGNOSIS — Z636 Dependent relative needing care at home: Secondary | ICD-10-CM | POA: Diagnosis not present

## 2016-11-04 DIAGNOSIS — Z794 Long term (current) use of insulin: Secondary | ICD-10-CM | POA: Diagnosis not present

## 2016-11-04 DIAGNOSIS — Z0000 Encounter for general adult medical examination without abnormal findings: Secondary | ICD-10-CM | POA: Diagnosis not present

## 2016-11-04 DIAGNOSIS — E785 Hyperlipidemia, unspecified: Secondary | ICD-10-CM | POA: Diagnosis not present

## 2016-11-04 DIAGNOSIS — I10 Essential (primary) hypertension: Secondary | ICD-10-CM | POA: Diagnosis not present

## 2016-11-04 LAB — BASIC METABOLIC PANEL
BUN: 21 mg/dL (ref 7–25)
CO2: 27 mmol/L (ref 20–31)
Calcium: 8.6 mg/dL (ref 8.6–10.4)
Chloride: 103 mmol/L (ref 98–110)
Creat: 0.93 mg/dL (ref 0.60–0.93)
GLUCOSE: 114 mg/dL — AB (ref 65–99)
POTASSIUM: 3.8 mmol/L (ref 3.5–5.3)
SODIUM: 141 mmol/L (ref 135–146)

## 2016-11-04 LAB — LIPID PANEL
CHOL/HDL RATIO: 4.8 ratio (ref <5.0)
Cholesterol: 235 mg/dL — ABNORMAL HIGH (ref <200)
HDL: 49 mg/dL — AB (ref >50)
LDL CALC: 133 mg/dL — AB (ref <100)
Triglycerides: 267 mg/dL — ABNORMAL HIGH (ref <150)
VLDL: 53 mg/dL — AB (ref <30)

## 2016-11-04 LAB — MICROALBUMIN, URINE: Microalb, Ur: 16.9 mg/dL

## 2016-11-04 MED ORDER — ZOSTER VAC RECOMB ADJUVANTED 50 MCG/0.5ML IM SUSR: 0.5000 mL | Freq: Once | INTRAMUSCULAR | 1 refills | Status: AC

## 2016-11-04 NOTE — Progress Notes (Signed)
Subjective:   [name] is a 76 y.o. female who presents for an Initial Medicare Annual Wellness Visit.        Objective:    Today's Vitals   11/04/16 1057  BP: 112/70  Pulse: 82  Temp: 98.2 F (36.8 C)  TempSrc: Oral  SpO2: 96%  Weight: 202 lb (91.6 kg)  Height: 5\' 2"  (1.575 m)   Body mass index is 36.95 kg/m.   Current Medications (verified) Outpatient Encounter Prescriptions as of 11/04/2016  Medication Sig  . amLODipine (NORVASC) 2.5 MG tablet TAKE 1 TABLET (2.5 MG TOTAL) BY MOUTH DAILY.  Marland Kitchen aspirin 81 MG tablet Take 81 mg by mouth daily.   . cetirizine (ZYRTEC) 10 MG tablet Take 10 mg by mouth daily as needed for allergies. For allergies  . Cholecalciferol (VITAMIN D3) 5000 UNITS CAPS Take 1 tablet by mouth daily. For supplement  . CINNAMON PO Take 1,000 mg by mouth daily.  . citalopram (CELEXA) 40 MG tablet TAKE 1 TABLET (40 MG TOTAL) BY MOUTH DAILY. 1 BY MOUTH DAILY FOR DEPRESSION  . docusate sodium (COLACE) 100 MG capsule Take 1 capsule (100 mg total) by mouth 2 (two) times daily.  Marland Kitchen glipiZIDE (GLUCOTROL XL) 10 MG 24 hr tablet TAKE ONE TABLET BY MOUTH IN THE MORNING AND ONE TABLET IN THE EVENING TO CONTROL BLOOD SUGAR  . HUMULIN N 100 UNIT/ML injection INJECT 33 UNITS IN THE MORNING AND 33 UNITS IN THE EVENING TO CONTROL BLOOD SUGAR  . losartan-hydrochlorothiazide (HYZAAR) 50-12.5 MG tablet TAKE ONE TABLET BY MOUTH ONCE DAILY TO CONTROL BLOOD PRESSURE  . Magnesium Oxide 250 MG TABS Take 500 mg by mouth daily.   . nitroGLYCERIN (NITROSTAT) 0.4 MG SL tablet Place 1 tablet (0.4 mg total) under the tongue every 5 (five) minutes as needed. For chest pain. Maximum of 3 tablets in 15 minutes.  . predniSONE (DELTASONE) 5 MG tablet One each morning to reduce pain  . senna-docusate (SENOKOT-S) 8.6-50 MG tablet Take 1 tablet by mouth daily.  . isosorbide mononitrate (IMDUR) 30 MG 24 hr tablet Take 1 tablet (30 mg total) by mouth daily. (Patient not taking: Reported on  11/04/2016)   No facility-administered encounter medications on file as of 11/04/2016.     Allergies (verified) Ciprofloxacin; Advair diskus [fluticasone-salmeterol]; Albuterol; Alendronate sodium; Benadryl [diphenhydramine hcl]; Benzonatate; Cephalexin; Gabapentin; Iohexol; Keflex [cephalexin]; Morphine; Other; Propoxyphene hcl; Reclast [zoledronic acid]; Avapro [irbesartan]; Codeine; Tessalon perles; and Tramadol   History: Past Medical History:  Diagnosis Date  . Abdominal pain, unspecified site   . Abnormality of gait   . Apnea   . Cardiomegaly   . Chest pain, unspecified   . Complication of anesthesia    hard time waking up  . Depressive disorder, not elsewhere classified   . Diabetes mellitus   . Dizziness and giddiness   . Dyskinesia of esophagus   . Edema   . Extrinsic asthma, unspecified   . Female stress incontinence   . Gout, unspecified   . High cholesterol   . Hypertension   . Lipoma of other skin and subcutaneous tissue   . Lumbago   . Memory loss   . Migraine without aura, with intractable migraine, so stated, with status migrainosus   . Mild cognitive impairment, so stated   . Nonspecific (abnormal) findings on radiological and other examination of abdominal area, including retroperitoneum   . Nonspecific abnormal results of liver function study   . Obesity, unspecified   . Obstructive chronic  bronchitis with exacerbation ([place])   . Other and unspecified hyperlipidemia   . Other B-complex deficiencies   . Other nonspecific abnormal serum enzyme levels   . Other specified cardiac dysrhythmias(427.89)   . Other specified disease of sebaceous glands   . Other symptoms involving cardiovascular system   . Pain in joint, ankle and foot   . Pain in joint, pelvic region and thigh   . Pain in joint, shoulder region   . Palpitations   . Reflux esophagitis   . Shortness of breath   . Tension headache   . Type I (juvenile type) diabetes mellitus without mention of  complication, not stated as uncontrolled   . Type I (juvenile type) diabetes mellitus without mention of complication, uncontrolled   . Unspecified essential hypertension   . Unspecified hypothyroidism   . Unspecified vitamin D deficiency   . Ventral hernia, unspecified, without mention of obstruction or gangrene    Past Surgical History:  Procedure Laterality Date  . ABDOMINAL HYSTERECTOMY    . APPENDECTOMY    . BREAST SURGERY    . EXPLORATORY LAPAROTOMY W/ BOWEL RESECTION  07/25/2004   PROCEDURE: Laparoscopy, open laparotomy, resection of jejunojejunostomy  . INCISIONAL HERNIA REPAIR  03/19/2006   PROCEDURE: Open ventral hernia repair with mesh.  Marland Kitchen LAPAROSCOPIC GASTRIC BYPASS  07/22/2004   PROCEDURE: Laparoscopic Roux-en-Y gastric bypass, antecolic, antegastric,  . LAPAROSCOPIC INCISIONAL / UMBILICAL / VENTRAL HERNIA REPAIR  09/22/2005   PROCEDURE: Laparoscopic ventral hernia repair with mesh.  Marland Kitchen LEFT HEART CATHETERIZATION WITH CORONARY ANGIOGRAM N/A 08/21/2014   Procedure: LEFT HEART CATHETERIZATION WITH CORONARY ANGIOGRAM;  Surgeon: Sherren Mocha, MD;  Location: [facility] CATH LAB;  Service: Cardiovascular;  Laterality: N/A;  . stents     last time she said was 1 yr ago    Family History  Problem Relation Age of Onset  . Stroke Mother   . Stroke Father   . Diabetes Father   . Heart disease Father   . Diabetes Son   . Cancer Brother        BLADDER  . Diabetes Brother    Social History   Occupational History  . Not on file.   Social History Main Topics  . Smoking status: Never Smoker  . Smokeless tobacco: Never Used  . Alcohol use No  . Drug use: No  . Sexual activity: Not Currently    Tobacco Counseling Counseling given: Not Answered   Activities of Daily Living In your present state of health, do you have any difficulty performing the following activities: 11/04/2016  Hearing? Y  Vision? N  Difficulty concentrating or making decisions? Y  Walking or climbing  stairs? Y  Dressing or bathing? N  Doing errands, shopping? N  Preparing Food and eating ? N  Using the Toilet? N  In the past six months, have you accidently leaked urine? N  Do you have problems with loss of bowel control? N  Managing your Medications? N  Managing your Finances? N  Housekeeping or managing your Housekeeping? N  Some recent data might be hidden    Immunizations and Health Maintenance Immunization History  Administered Date(s) Administered  . DTaP 11/12/2011  . Influenza Split 02/13/2009, 02/28/2014  . Influenza,inj,Quad PF,36+ Mos 05/01/2015, 03/04/2016  . Influenza-Unspecified 02/22/2013  . Pneumococcal Conjugate-13 06/07/2013  . Tdap 11/12/2011   Health Maintenance Due  Topic Date Due  . PNA vac Low Risk Adult (2 of 2 - PPSV23) 06/07/2014  . FOOT EXAM  08/23/2014  Patient Care Team: [name], MD as PCP - General (Internal Medicine) Excell Seltzer, MD as Consulting Physician (General Surgery) Tanda Rockers, MD as Consulting Physician (Pulmonary Disease) Ronald Lobo, MD as Consulting Physician (Gastroenterology) Jacolyn Reedy, MD as Consulting Physician (Cardiology)  Indicate any recent Medical Services you may have received from other than [facility] providers in the past year (date may be approximate).     Assessment:   This is a routine wellness examination for [name].   Hearing/Vision screen No exam data present  Dietary issues and exercise activities discussed: Current Exercise Habits: The patient does not participate in regular exercise at present, Exercise limited by: None identified  Goals    . Decrease Stress Level          Patient will try to decrease stress level with yoga and having some time for herself.      Depression Screen PHQ 2/9 Scores 11/04/2016 01/22/2016 12/05/2014 12/06/2012  PHQ - 2 Score 3 2 0 4  PHQ- 9 Score 7 - - 7    Fall Risk Fall Risk  11/04/2016 07/08/2016 06/11/2016 01/22/2016 08/14/2015  Falls in the  past year? Yes No No No Yes  Number falls in past yr: 2 or more - - - 1  Injury with Fall? No - - - No    Cognitive Function: MMSE - Mini Mental State Exam 11/04/2016  Orientation to time 5  Orientation to Place 5  Registration 3  Attention/ Calculation 5  Recall 2  Language- name 2 objects 2  Language- repeat 1  Language- follow 3 step command 3  Language- read & follow direction 1  Write a sentence 1  Copy design 1  Total score 29        Screening Tests Health Maintenance  Topic Date Due  . PNA vac Low Risk Adult (2 of 2 - PPSV23) 06/07/2014  . FOOT EXAM  08/23/2014  . HEMOGLOBIN A1C  12/20/2016  . INFLUENZA VACCINE  12/30/2016  . OPHTHALMOLOGY EXAM  11/03/2017  . TETANUS/TDAP  11/11/2021  . DEXA SCAN  Completed      Plan:    I have personally reviewed and addressed the Medicare Annual Wellness questionnaire and have noted the following in the patient's chart:  A. Medical and social history B. Use of alcohol, tobacco or illicit drugs  C. Current medications and supplements D. Functional ability and status E.  Nutritional status F.  Physical activity G. Advance directives H. List of other physicians I.  Hospitalizations, surgeries, and ER visits in previous 12 months J.  [place] to include hearing, vision, cognitive, depression L. Referrals and appointments - none  In addition, I have reviewed and discussed with patient certain preventive protocols, quality metrics, and best practice recommendations. A written personalized care plan for preventive services as well as general preventive health recommendations were provided to patient.  See attached scanned questionnaire for additional information.   Signed,   Rich Reining, RN Nurse Health Advisor   Quick Notes   Health Maintenance: PNA 23 given today. Foot exam due. Prescription for Shingrix sent to pharmacy.     Abnormal Screen: MMSE 29/30. Passed clock drawing     Patient  Concerns: None     Nurse Concerns: Stress level. Having [name] contact her for silver sneakers and for possible a group once a week for her husband to go to to relieve her.

## 2016-11-04 NOTE — Patient Instructions (Signed)
[name] , Thank you for taking time to come for your Medicare Wellness Visit. I appreciate your ongoing commitment to your health goals. Please review the following plan we discussed and let me know if I can assist you in the future.   Screening recommendations/referrals: Colonoscopy up to date. Mammogram up to date Bone Density up to date Recommended yearly ophthalmology/optometry visit for glaucoma screening and checkup Recommended yearly dental visit for hygiene and checkup  Vaccinations: Influenza vaccine due 03/04/2017 Pneumococcal vaccine 23 given today. Up to date Tdap vaccine due 11/11/2021 Shingles vaccine due. Prescription sent to your pharmacy  Advanced directives: Advance directive discussed with you today. I have provided a copy for you to complete at home and have notarized. Once this is complete please bring a copy in to our office so we can scan it into your chart.   Conditions/risks identified: Stress, [place] our Careguide will contact you.  Next appointment: Sherrie Mustache, NP 11/10/16 @ 10:15am   Preventive Care 78 Years and Older, Female Preventive care refers to lifestyle choices and visits with your health care provider that can promote health and wellness. What does preventive care include?  A yearly physical exam. This is also called an annual well check.  Dental exams once or twice a year.  Routine eye exams. Ask your health care provider how often you should have your eyes checked.  Personal lifestyle choices, including:  Daily care of your teeth and gums.  Regular physical activity.  Eating a healthy diet.  Avoiding tobacco and drug use.  Limiting alcohol use.  Practicing safe sex.  Taking low-dose aspirin every day.  Taking vitamin and mineral supplements as recommended by your health care provider. What happens during an annual well check? The services and screenings done by your health care provider during your annual well check will  depend on your age, overall health, lifestyle risk factors, and family history of disease. Counseling  Your health care provider may ask you questions about your:  Alcohol use.  Tobacco use.  Drug use.  Emotional well-being.  Home and relationship well-being.  Sexual activity.  Eating habits.  History of falls.  Memory and ability to understand (cognition).  Work and work Statistician.  Reproductive health. Screening  You may have the following tests or measurements:  Height, weight, and BMI.  Blood pressure.  Lipid and cholesterol levels. These may be checked every 5 years, or more frequently if you are over 15 years old.  Skin check.  Lung cancer screening. You may have this screening every year starting at age 32 if you have a 30-pack-year history of smoking and currently smoke or have quit within the past 15 years.  Fecal occult blood test (FOBT) of the stool. You may have this test every year starting at age 33.  Flexible sigmoidoscopy or colonoscopy. You may have a sigmoidoscopy every 5 years or a colonoscopy every 10 years starting at age 90.  Hepatitis C blood test.  Hepatitis B blood test.  Sexually transmitted disease (STD) testing.  Diabetes screening. This is done by checking your blood sugar (glucose) after you have not eaten for a while (fasting). You may have this done every 1-3 years.  Bone density scan. This is done to screen for osteoporosis. You may have this done starting at age 78.  Mammogram. This may be done every 1-2 years. Talk to your health care provider about how often you should have regular mammograms. Talk with your health care provider about your  test results, treatment options, and if necessary, the need for more tests. Vaccines  Your health care provider may recommend certain vaccines, such as:  Influenza vaccine. This is recommended every year.  Tetanus, diphtheria, and acellular pertussis (Tdap, Td) vaccine. You may need a  Td booster every 10 years.  Zoster vaccine. You may need this after age 60.  Pneumococcal 13-valent conjugate (PCV13) vaccine. One dose is recommended after age 34.  Pneumococcal polysaccharide (PPSV23) vaccine. One dose is recommended after age 29. Talk to your health care provider about which screenings and vaccines you need and how often you need them. This information is not intended to replace advice given to you by your health care provider. Make sure you discuss any questions you have with your health care provider. Document Released: 06/14/2015 Document Revised: 02/05/2016 Document Reviewed: 03/19/2015 Elsevier Interactive Patient Education  2017 [place] Prevention in the Home Falls can cause injuries. They can happen to people of all ages. There are many things you can do to make your home safe and to help prevent falls. What can I do on the outside of my home?  Regularly fix the edges of walkways and driveways and fix any cracks.  Remove anything that might make you trip as you walk through a door, such as a raised step or threshold.  Trim any bushes or trees on the path to your home.  Use bright outdoor lighting.  Clear any walking paths of anything that might make someone trip, such as rocks or tools.  Regularly check to see if handrails are loose or broken. Make sure that both sides of any steps have handrails.  Any raised decks and porches should have guardrails on the edges.  Have any leaves, snow, or ice cleared regularly.  Use sand or salt on walking paths during winter.  Clean up any spills in your garage right away. This includes oil or grease spills. What can I do in the bathroom?  Use night lights.  Install grab bars by the toilet and in the tub and shower. Do not use towel bars as grab bars.  Use non-skid mats or decals in the tub or shower.  If you need to sit down in the shower, use a plastic, non-slip stool.  Keep the floor dry. Clean  up any water that spills on the floor as soon as it happens.  Remove soap buildup in the tub or shower regularly.  Attach bath mats securely with double-sided non-slip rug tape.  Do not have throw rugs and other things on the floor that can make you trip. What can I do in the bedroom?  Use night lights.  Make sure that you have a light by your bed that is easy to reach.  Do not use any sheets or blankets that are too big for your bed. They should not hang down onto the floor.  Have a firm chair that has side arms. You can use this for support while you get dressed.  Do not have throw rugs and other things on the floor that can make you trip. What can I do in the kitchen?  Clean up any spills right away.  Avoid walking on wet floors.  Keep items that you use a lot in easy-to-reach places.  If you need to reach something above you, use a strong step stool that has a grab bar.  Keep electrical cords out of the way.  Do not use floor polish or wax that  makes floors slippery. If you must use wax, use non-skid floor wax.  Do not have throw rugs and other things on the floor that can make you trip. What can I do with my stairs?  Do not leave any items on the stairs.  Make sure that there are handrails on both sides of the stairs and use them. Fix handrails that are broken or loose. Make sure that handrails are as long as the stairways.  Check any carpeting to make sure that it is firmly attached to the stairs. Fix any carpet that is loose or worn.  Avoid having throw rugs at the top or bottom of the stairs. If you do have throw rugs, attach them to the floor with carpet tape.  Make sure that you have a light switch at the top of the stairs and the bottom of the stairs. If you do not have them, ask someone to add them for you. What else can I do to help prevent falls?  Wear shoes that:  Do not have high heels.  Have rubber bottoms.  Are comfortable and fit you well.  Are  closed at the toe. Do not wear sandals.  If you use a stepladder:  Make sure that it is fully opened. Do not climb a closed stepladder.  Make sure that both sides of the stepladder are locked into place.  Ask someone to hold it for you, if possible.  Clearly mark and make sure that you can see:  Any grab bars or handrails.  First and last steps.  Where the edge of each step is.  Use tools that help you move around (mobility aids) if they are needed. These include:  Canes.  Walkers.  Scooters.  Crutches.  Turn on the lights when you go into a dark area. Replace any light bulbs as soon as they burn out.  Set up your furniture so you have a clear path. Avoid moving your furniture around.  If any of your floors are uneven, fix them.  If there are any pets around you, be aware of where they are.  Review your medicines with your doctor. Some medicines can make you feel dizzy. This can increase your chance of falling. Ask your doctor what other things that you can do to help prevent falls. This information is not intended to replace advice given to you by your health care provider. Make sure you discuss any questions you have with your health care provider. Document Released: 03/14/2009 Document Revised: 10/24/2015 Document Reviewed: 06/22/2014 Elsevier Interactive Patient Education  2017 [employer].

## 2016-11-05 LAB — TSH: TSH: 3.68 m[IU]/L

## 2016-11-05 LAB — HEMOGLOBIN A1C
Hgb A1c MFr Bld: 8.6 % — ABNORMAL HIGH (ref <5.7)
Mean Plasma Glucose: 200 mg/dL

## 2016-11-06 DIAGNOSIS — D2262 Melanocytic nevi of left upper limb, including shoulder: Secondary | ICD-10-CM | POA: Diagnosis not present

## 2016-11-06 DIAGNOSIS — L821 Other seborrheic keratosis: Secondary | ICD-10-CM | POA: Diagnosis not present

## 2016-11-06 DIAGNOSIS — L720 Epidermal cyst: Secondary | ICD-10-CM | POA: Diagnosis not present

## 2016-11-06 DIAGNOSIS — L814 Other melanin hyperpigmentation: Secondary | ICD-10-CM | POA: Diagnosis not present

## 2016-11-06 DIAGNOSIS — L820 Inflamed seborrheic keratosis: Secondary | ICD-10-CM | POA: Diagnosis not present

## 2016-11-06 DIAGNOSIS — D1801 Hemangioma of skin and subcutaneous tissue: Secondary | ICD-10-CM | POA: Diagnosis not present

## 2016-11-09 ENCOUNTER — Other Ambulatory Visit: Payer: Medicare Other

## 2016-11-09 ENCOUNTER — Ambulatory Visit: Payer: Medicare Other

## 2016-11-10 ENCOUNTER — Ambulatory Visit (INDEPENDENT_AMBULATORY_CARE_PROVIDER_SITE_OTHER): Payer: Medicare Other | Admitting: Nurse Practitioner

## 2016-11-10 ENCOUNTER — Encounter: Payer: Self-pay | Admitting: Nurse Practitioner

## 2016-11-10 VITALS — BP 122/74 | HR 62 | Temp 97.8°F | Resp 17

## 2016-11-10 DIAGNOSIS — E785 Hyperlipidemia, unspecified: Secondary | ICD-10-CM

## 2016-11-10 DIAGNOSIS — F419 Anxiety disorder, unspecified: Secondary | ICD-10-CM | POA: Diagnosis not present

## 2016-11-10 DIAGNOSIS — I10 Essential (primary) hypertension: Secondary | ICD-10-CM

## 2016-11-10 DIAGNOSIS — G894 Chronic pain syndrome: Secondary | ICD-10-CM

## 2016-11-10 DIAGNOSIS — I25119 Atherosclerotic heart disease of native coronary artery with unspecified angina pectoris: Secondary | ICD-10-CM | POA: Diagnosis not present

## 2016-11-10 DIAGNOSIS — E113513 Type 2 diabetes mellitus with proliferative diabetic retinopathy with macular edema, bilateral: Secondary | ICD-10-CM | POA: Diagnosis not present

## 2016-11-10 DIAGNOSIS — F329 Major depressive disorder, single episode, unspecified: Secondary | ICD-10-CM | POA: Diagnosis not present

## 2016-11-10 DIAGNOSIS — I209 Angina pectoris, unspecified: Secondary | ICD-10-CM | POA: Diagnosis not present

## 2016-11-10 DIAGNOSIS — I208 Other forms of angina pectoris: Secondary | ICD-10-CM | POA: Diagnosis not present

## 2016-11-10 DIAGNOSIS — Z794 Long term (current) use of insulin: Secondary | ICD-10-CM | POA: Diagnosis not present

## 2016-11-10 DIAGNOSIS — E669 Obesity, unspecified: Secondary | ICD-10-CM | POA: Diagnosis not present

## 2016-11-10 DIAGNOSIS — K5900 Constipation, unspecified: Secondary | ICD-10-CM

## 2016-11-10 NOTE — Progress Notes (Signed)
Careteam: Patient Care Team: [name], MD as PCP - General (Internal Medicine) Excell Seltzer, MD as Consulting Physician (General Surgery) Tanda Rockers, MD as Consulting Physician (Pulmonary Disease) Ronald Lobo, MD as Consulting Physician (Gastroenterology) Jacolyn Reedy, MD as Consulting Physician (Cardiology)  Advanced Directive information Does Patient Have a Medical Advance Directive?: Yes, Type of Advance Directive: [place];Living will  Allergies  Allergen Reactions  . Ciprofloxacin Rash  . Advair Diskus [Fluticasone-Salmeterol] Other (See Comments)    Throat closes  . Albuterol     REACTION: closes throat  . Alendronate Sodium Itching  . Benadryl [Diphenhydramine Hcl] Itching  . Benzonatate     REACTION: rash/hives  . Cephalexin Nausea Only  . Gabapentin Other (See Comments)    Loss of memory  . Iohexol      Desc: rash and DIF BREATHING   . Keflex [Cephalexin] Nausea And Vomiting  . Morphine Nausea And Vomiting  . Other Other (See Comments)    Decongestants- keeps pt awake at night, increased heart rate  . Propoxyphene Hcl     Doesn't recall  . Reclast [Zoledronic Acid] Other (See Comments)    Chest pain  . Avapro [Irbesartan] Rash  . Codeine Nausea And Vomiting, Swelling and Rash  . Tessalon Perles Rash  . Tramadol Nausea Only and Rash    Chief Complaint  Patient presents with  . Medical Management of Chronic Issues    Pt is being seen for a 4 month routine visit. Labs printed     HPI: Patient is a 76 y.o. female seen in the office today for routine follow up. Pt with hx of DM, HTn, CAD, obesity, hyperlipidemia, hypothyroid, CAD.  Reports she is very depressed- referral for husband to go to day progam "[employer]" in [place] and she would like to go  Type 2 diabetes mellitus with both eyes affected by proliferative retinopathy and macular edema, with long-term current use of insulin (HCC)  Essential  hypertension, benign- taking losartan/HTCZ and Norvasc- blood pressure controlled. Follows low sodium diet, except when eating out because foods are higher in sodium.   Coronary artery disease involving native coronary artery of native heart with angina pectoris ([place])- not taking IMDUR, went to cardiology and was started on it for chest pain- reports it made her feel dizzy and off, made her feel worse than she had prior so she stopped taking. No current chest pains. conts on ASA daily   Obesity (BMI 30-39.9)- declined weight today, reports she had it last week.  Weight has been trending up. Reports she does not care. Has had increase in stress with her husbands memory loss. Eating a lot of unhealthy food due to stress and not able to stand to cook. Getting frozen dinners for "weight control" snacks on cracker and peanut butter. Has seen nutritionist in the past does not wish to see one again.  Hyperlipidemia, unspecified hyperlipidemia type- not currently on medication for cholesterol. Diet has been bad.  LDL not at goal.   Hypothyroidism, unspecified type- stable on lab, NOT on medication.   Constipation, unspecified constipation type- controlled colace   Chronic pain syndrome- taking prednisone 5 mg daily for DDD in lower spine and bursitis in left hip and this helps control the pain.   Review of Systems:  Review of Systems  Constitutional: Negative for chills, fever and weight loss.  HENT: Negative for tinnitus.   Respiratory: Negative for cough, sputum production and shortness of breath.  Cardiovascular: Negative for chest pain, palpitations and leg swelling.  Gastrointestinal: Positive for constipation (controlled on medications). Negative for abdominal pain, diarrhea and heartburn.  Genitourinary: Negative for dysuria, frequency and urgency.  Musculoskeletal: Positive for back pain, joint pain and myalgias. Negative for falls.  Skin: Negative.   Neurological: Negative for dizziness  and headaches.  Psychiatric/Behavioral: Positive for depression. Negative for memory loss and suicidal ideas. The patient is nervous/anxious.     Past Medical History:  Diagnosis Date  . Abdominal pain, unspecified site   . Abnormality of gait   . Apnea   . Cardiomegaly   . Chest pain, unspecified   . Complication of anesthesia    hard time waking up  . Depressive disorder, not elsewhere classified   . Diabetes mellitus   . Dizziness and giddiness   . Dyskinesia of esophagus   . Edema   . Extrinsic asthma, unspecified   . Female stress incontinence   . Gout, unspecified   . High cholesterol   . Hypertension   . Lipoma of other skin and subcutaneous tissue   . Lumbago   . Memory loss   . Migraine without aura, with intractable migraine, so stated, with status migrainosus   . Mild cognitive impairment, so stated   . Nonspecific (abnormal) findings on radiological and other examination of abdominal area, including retroperitoneum   . Nonspecific abnormal results of liver function study   . Obesity, unspecified   . Obstructive chronic bronchitis with exacerbation ([place])   . Other and unspecified hyperlipidemia   . Other B-complex deficiencies   . Other nonspecific abnormal serum enzyme levels   . Other specified cardiac dysrhythmias(427.89)   . Other specified disease of sebaceous glands   . Other symptoms involving cardiovascular system   . Pain in joint, ankle and foot   . Pain in joint, pelvic region and thigh   . Pain in joint, shoulder region   . Palpitations   . Reflux esophagitis   . Shortness of breath   . Tension headache   . Type I (juvenile type) diabetes mellitus without mention of complication, not stated as uncontrolled   . Type I (juvenile type) diabetes mellitus without mention of complication, uncontrolled   . Unspecified essential hypertension   . Unspecified hypothyroidism   . Unspecified vitamin D deficiency   . Ventral hernia, unspecified, without  mention of obstruction or gangrene    Past Surgical History:  Procedure Laterality Date  . ABDOMINAL HYSTERECTOMY    . APPENDECTOMY    . BREAST SURGERY    . EXPLORATORY LAPAROTOMY W/ BOWEL RESECTION  07/25/2004   PROCEDURE: Laparoscopy, open laparotomy, resection of jejunojejunostomy  . INCISIONAL HERNIA REPAIR  03/19/2006   PROCEDURE: Open ventral hernia repair with mesh.  Marland Kitchen LAPAROSCOPIC GASTRIC BYPASS  07/22/2004   PROCEDURE: Laparoscopic Roux-en-Y gastric bypass, antecolic, antegastric,  . LAPAROSCOPIC INCISIONAL / UMBILICAL / VENTRAL HERNIA REPAIR  09/22/2005   PROCEDURE: Laparoscopic ventral hernia repair with mesh.  Marland Kitchen LEFT HEART CATHETERIZATION WITH CORONARY ANGIOGRAM N/A 08/21/2014   Procedure: LEFT HEART CATHETERIZATION WITH CORONARY ANGIOGRAM;  Surgeon: Sherren Mocha, MD;  Location: [facility] CATH LAB;  Service: Cardiovascular;  Laterality: N/A;  . stents     last time she said was 1 yr ago    Social History:   reports that she has never smoked. She has never used smokeless tobacco. She reports that she does not drink alcohol or use drugs.  Family History  Problem Relation Age of Onset  .  Stroke Mother   . Stroke Father   . Diabetes Father   . Heart disease Father   . Diabetes Son   . Cancer Brother        BLADDER  . Diabetes Brother     Medications: Patient's Medications  New Prescriptions   No medications on file  Previous Medications   AMLODIPINE (NORVASC) 2.5 MG TABLET    TAKE 1 TABLET (2.5 MG TOTAL) BY MOUTH DAILY.   ASPIRIN 81 MG TABLET    Take 81 mg by mouth daily.    CETIRIZINE (ZYRTEC) 10 MG TABLET    Take 10 mg by mouth daily as needed for allergies. For allergies   CHOLECALCIFEROL (VITAMIN D3) 5000 UNITS CAPS    Take 1 tablet by mouth daily. For supplement   CINNAMON PO    Take 1,000 mg by mouth daily.   CITALOPRAM (CELEXA) 40 MG TABLET    TAKE 1 TABLET (40 MG TOTAL) BY MOUTH DAILY. 1 BY MOUTH DAILY FOR DEPRESSION   DOCUSATE SODIUM (COLACE) 100 MG CAPSULE     Take 1 capsule (100 mg total) by mouth 2 (two) times daily.   GLIPIZIDE (GLUCOTROL XL) 10 MG 24 HR TABLET    TAKE ONE TABLET BY MOUTH IN THE MORNING AND ONE TABLET IN THE EVENING TO CONTROL BLOOD SUGAR   HUMULIN N 100 UNIT/ML INJECTION    INJECT 33 UNITS IN THE MORNING AND 33 UNITS IN THE EVENING TO CONTROL BLOOD SUGAR   ISOSORBIDE MONONITRATE (IMDUR) 30 MG 24 HR TABLET    Take 1 tablet (30 mg total) by mouth daily.   LOSARTAN-HYDROCHLOROTHIAZIDE (HYZAAR) 50-12.5 MG TABLET    TAKE ONE TABLET BY MOUTH ONCE DAILY TO CONTROL BLOOD PRESSURE   MAGNESIUM OXIDE 250 MG TABS    Take 500 mg by mouth daily.    NITROGLYCERIN (NITROSTAT) 0.4 MG SL TABLET    Place 1 tablet (0.4 mg total) under the tongue every 5 (five) minutes as needed. For chest pain. Maximum of 3 tablets in 15 minutes.   OMEPRAZOLE (PRILOSEC) 40 MG CAPSULE    Take 1 capsule by mouth daily.   PREDNISONE (DELTASONE) 5 MG TABLET    One each morning to reduce pain   SENNA-DOCUSATE (SENOKOT-S) 8.6-50 MG TABLET    Take 1 tablet by mouth daily.  Modified Medications   No medications on file  Discontinued Medications   No medications on file     Physical Exam:  Vitals:   11/10/16 1026  BP: 122/74  Pulse: 62  Resp: 17  Temp: 97.8 F (36.6 C)  TempSrc: Oral  SpO2: 96%   There is no height or weight on file to calculate BMI.  Physical Exam  Constitutional: She is oriented to person, place, and time. She appears well-developed and well-nourished. No distress.  HENT:  Head: Normocephalic and atraumatic.  Nose: Nose normal.  Mouth/Throat: Oropharynx is clear and moist.  Eyes: Conjunctivae and EOM are normal. Pupils are equal, round, and reactive to light.  Neck: No JVD present. No tracheal deviation present. No thyromegaly present.  Cardiovascular: Normal rate, regular rhythm, normal heart sounds and intact distal pulses.   Pulmonary/Chest: No respiratory distress. She has no wheezes. She has no rales. She exhibits no tenderness.    Abdominal: Bowel sounds are normal. She exhibits distension (rounded and soft). She exhibits no mass. There is no tenderness. A hernia is present.  obese  Musculoskeletal: She exhibits tenderness. She exhibits no edema.  Lymphadenopathy:  She has no cervical adenopathy.  Neurological: She is alert and oriented to person, place, and time. She has normal reflexes. No cranial nerve deficit. Coordination normal.  Skin: Skin is warm and dry.  Psychiatric: Judgment and thought content normal.  Seems depressed and anxious.    Labs reviewed: Basic Metabolic Panel:  Recent Labs  01/20/16 1048  06/04/16 2343 06/22/16 0905 11/04/16 0958  NA  --   < > 137 140 141  K  --   < > 4.2 4.3 3.8  CL  --   < > 101 104 103  CO2  --   < > 28 30 27   GLUCOSE  --   < > 265* 76 114*  BUN  --   < > 15 16 21   CREATININE  --   < > 0.80 0.82 0.93  CALCIUM  --   < > 9.0 9.0 8.6  TSH 1.90  --   --   --  3.68  < > = values in this interval not displayed. Liver Function Tests:  Recent Labs  01/20/16 1044  AST 16  ALT 14  ALKPHOS 80  BILITOT 0.6  PROT 6.2  ALBUMIN 3.7   No results for input(s): LIPASE, AMYLASE in the last 8760 hours. No results for input(s): AMMONIA in the last 8760 hours. CBC:  Recent Labs  02/24/16 0951 06/04/16 2343  WBC 8.3 7.5  NEUTROABS  --  6.2  HGB 11.3* 12.0  HCT 36.8 38.5  MCV 83.1 86.1  PLT 369 311   Lipid Panel:  Recent Labs  01/20/16 1044 11/04/16 0958  CHOL 198 235*  HDL 43* 49*  LDLCALC 118 133*  TRIG 183* 267*  CHOLHDL 4.6 4.8   TSH:  Recent Labs  01/20/16 1048 11/04/16 0958  TSH 1.90 3.68   A1C: Lab Results  Component Value Date   HGBA1C 8.6 (H) 11/04/2016     Assessment/Plan 1. Type 2 diabetes mellitus with both eyes affected by proliferative retinopathy and macular edema, with long-term current use of insulin (HCC) Worse, pt not taking correct dose of humulin- taking 31 units, encouraged to go back to 33 units also will make  dietary changes. Will have her record blood sugars and make dietary log. To follow up in 6 weeks with blood sugars.   2. Essential hypertension, benign Blood pressure stable, encouraged dietary modifications and to cont current medications.   3. Coronary artery disease involving native coronary artery of native heart with angina pectoris (HCC) Stable, without chest pains at this time. Following with cardiology.   4. Obesity (BMI 30-39.9) Weight trending up since January. Pt aware diet has been poor. Discussed worsening of diabetes and cholesterol. Information given for DASH diet and low cholesterol diet. Diet discussed at length.   5. Hyperlipidemia, unspecified hyperlipidemia type LDL worse, discussed changes in lifestyle vs medication. Pt willing to make changes to diet at this time.  6. Constipation, unspecified constipation type Controlled on current regimen.   7. Chronic pain syndrome Controlled on prednisone  8. Anxiety and depression Worse at this time. Lots of caregiver burden with her husband with dementia and other co-morbities. Feels like she needs more assistance at home and more time for herself.  -consider changing celexa to cymbalta if needed- may also help with pain syndrome.  - Ambulatory referral to [place]. Harle Battiest  [facility] [phone] 8 am - 5 pm) [phone] (after hours)

## 2016-11-10 NOTE — Patient Instructions (Addendum)
Food log Limit fried and sweets and sodas and peanut butter Chair exercises    DASH Eating Plan DASH stands for "Dietary Approaches to Stop Hypertension." The DASH eating plan is a healthy eating plan that has been shown to reduce high blood pressure (hypertension). It may also reduce your risk for type 2 diabetes, heart disease, and stroke. The DASH eating plan may also help with weight loss. What are tips for following this plan? General guidelines  Avoid eating more than 2,300 mg (milligrams) of salt (sodium) a day. If you have hypertension, you may need to reduce your sodium intake to 1,500 mg a day.  Limit alcohol intake to no more than 1 drink a day for nonpregnant women and 2 drinks a day for men. One drink equals 12 oz of beer, 5 oz of wine, or 1 oz of hard liquor.  Work with your health care provider to maintain a healthy body weight or to lose weight. Ask what an ideal weight is for you.  Get at least 30 minutes of exercise that causes your heart to beat faster (aerobic exercise) most days of the week. Activities may include walking, swimming, or biking.  Work with your health care provider or diet and nutrition specialist (dietitian) to adjust your eating plan to your individual calorie needs. Reading food labels  Check food labels for the amount of sodium per serving. Choose foods with less than 5 percent of the Daily Value of sodium. Generally, foods with less than 300 mg of sodium per serving fit into this eating plan.  To find whole grains, look for the word "whole" as the first word in the ingredient list. Shopping  Buy products labeled as "low-sodium" or "no salt added."  Buy fresh foods. Avoid canned foods and premade or frozen meals. Cooking  Avoid adding salt when cooking. Use salt-free seasonings or herbs instead of table salt or sea salt. Check with your health care provider or pharmacist before using salt substitutes.  Do not fry foods. Cook foods using  healthy methods such as baking, boiling, grilling, and broiling instead.  Cook with heart-healthy oils, such as olive, canola, soybean, or sunflower oil. Meal planning   Eat a balanced diet that includes: ? 5 or more servings of fruits and vegetables each day. At each meal, try to fill half of your plate with fruits and vegetables. ? Up to 6-8 servings of whole grains each day. ? Less than 6 oz of lean meat, poultry, or fish each day. A 3-oz serving of meat is about the same size as a deck of cards. One egg equals 1 oz. ? 2 servings of low-fat dairy each day. ? A serving of nuts, seeds, or beans 5 times each week. ? Heart-healthy fats. Healthy fats called Omega-3 fatty acids are found in foods such as flaxseeds and coldwater fish, like sardines, salmon, and mackerel.  Limit how much you eat of the following: ? Canned or prepackaged foods. ? Food that is high in trans fat, such as fried foods. ? Food that is high in saturated fat, such as fatty meat. ? Sweets, desserts, sugary drinks, and other foods with added sugar. ? Full-fat dairy products.  Do not salt foods before eating.  Try to eat at least 2 vegetarian meals each week.  Eat more home-cooked food and less restaurant, buffet, and fast food.  When eating at a restaurant, ask that your food be prepared with less salt or no salt, if possible. What foods are  recommended? The items listed may not be a complete list. Talk with your dietitian about what dietary choices are best for you. Grains Whole-grain or whole-wheat bread. Whole-grain or whole-wheat pasta. Brown rice. [place]. Bulgur. Whole-grain and low-sodium cereals. Pita bread. Low-fat, low-sodium crackers. Whole-wheat flour tortillas. Vegetables Fresh or frozen vegetables (raw, steamed, roasted, or grilled). Low-sodium or reduced-sodium tomato and vegetable juice. Low-sodium or reduced-sodium tomato sauce and tomato paste. Low-sodium or reduced-sodium canned  vegetables. Fruits All fresh, dried, or frozen fruit. Canned fruit in natural juice (without added sugar). Meat and other protein foods Skinless chicken or [place]. Ground chicken or [place]. Pork with fat trimmed off. Fish and seafood. Egg whites. Dried beans, peas, or lentils. Unsalted nuts, nut butters, and seeds. Unsalted canned beans. Lean cuts of beef with fat trimmed off. Low-sodium, lean deli meat. Dairy Low-fat (1%) or fat-free (skim) milk. Fat-free, low-fat, or reduced-fat cheeses. Nonfat, low-sodium ricotta or cottage cheese. Low-fat or nonfat yogurt. Low-fat, low-sodium cheese. Fats and oils Soft margarine without trans fats. Vegetable oil. Low-fat, reduced-fat, or light mayonnaise and salad dressings (reduced-sodium). Canola, safflower, olive, soybean, and sunflower oils. Avocado. Seasoning and other foods Herbs. Spices. Seasoning mixes without salt. Unsalted popcorn and pretzels. Fat-free sweets. What foods are not recommended? The items listed may not be a complete list. Talk with your dietitian about what dietary choices are best for you. Grains Baked goods made with fat, such as croissants, muffins, or some breads. Dry pasta or rice meal packs. Vegetables Creamed or fried vegetables. Vegetables in a cheese sauce. Regular canned vegetables (not low-sodium or reduced-sodium). Regular canned tomato sauce and paste (not low-sodium or reduced-sodium). Regular tomato and vegetable juice (not low-sodium or reduced-sodium). Angie Fava. Olives. Fruits Canned fruit in a light or heavy syrup. Fried fruit. Fruit in cream or butter sauce. Meat and other protein foods Fatty cuts of meat. Ribs. Fried meat. Berniece Salines. Sausage. Bologna and other processed lunch meats. Salami. Fatback. Hotdogs. Bratwurst. Salted nuts and seeds. Canned beans with added salt. Canned or smoked fish. Whole eggs or egg yolks. Chicken or [place] with skin. Dairy Whole or 2% milk, cream, and half-and-half. Whole or full-fat  cream cheese. Whole-fat or sweetened yogurt. Full-fat cheese. Nondairy creamers. Whipped toppings. Processed cheese and cheese spreads. Fats and oils Butter. Stick margarine. Lard. Shortening. Ghee. Bacon fat. Tropical oils, such as coconut, palm kernel, or palm oil. Seasoning and other foods Salted popcorn and pretzels. Onion salt, garlic salt, seasoned salt, table salt, and sea salt. Worcestershire sauce. Tartar sauce. Barbecue sauce. Teriyaki sauce. Soy sauce, including reduced-sodium. Steak sauce. Canned and packaged gravies. Fish sauce. Oyster sauce. Cocktail sauce. Horseradish that you find on the shelf. Ketchup. Mustard. Meat flavorings and tenderizers. Bouillon cubes. Hot sauce and Tabasco sauce. Premade or packaged marinades. Premade or packaged taco seasonings. Relishes. Regular salad dressings. Where to find more information:  National Heart, Lung, and [place]: https://wilson-eaton.com/  American Heart Association: www.heart.org Summary  The DASH eating plan is a healthy eating plan that has been shown to reduce high blood pressure (hypertension). It may also reduce your risk for type 2 diabetes, heart disease, and stroke.  With the DASH eating plan, you should limit salt (sodium) intake to 2,300 mg a day. If you have hypertension, you may need to reduce your sodium intake to 1,500 mg a day.  When on the DASH eating plan, aim to eat more fresh fruits and vegetables, whole grains, lean proteins, low-fat dairy, and heart-healthy fats.  Work with your health  care provider or diet and nutrition specialist (dietitian) to adjust your eating plan to your individual calorie needs. This information is not intended to replace advice given to you by your health care provider. Make sure you discuss any questions you have with your health care provider. Document Released: 05/07/2011 Document Revised: 05/11/2016 Document Reviewed: 05/11/2016 Elsevier Interactive Patient Education  2017 [employer].

## 2016-11-11 ENCOUNTER — Ambulatory Visit: Payer: Medicare Other | Admitting: Internal Medicine

## 2016-11-17 ENCOUNTER — Encounter: Payer: Self-pay | Admitting: Nurse Practitioner

## 2016-11-17 ENCOUNTER — Ambulatory Visit (INDEPENDENT_AMBULATORY_CARE_PROVIDER_SITE_OTHER): Payer: Medicare Other | Admitting: Nurse Practitioner

## 2016-11-17 VITALS — BP 120/70 | HR 90 | Ht 62.0 in | Wt 203.8 lb

## 2016-11-17 DIAGNOSIS — R079 Chest pain, unspecified: Secondary | ICD-10-CM | POA: Diagnosis not present

## 2016-11-17 DIAGNOSIS — I5181 Takotsubo syndrome: Secondary | ICD-10-CM

## 2016-11-17 DIAGNOSIS — I10 Essential (primary) hypertension: Secondary | ICD-10-CM

## 2016-11-17 DIAGNOSIS — I208 Other forms of angina pectoris: Secondary | ICD-10-CM

## 2016-11-17 NOTE — Patient Instructions (Addendum)
We will be checking the following labs today - BMET, CBC, PT, PTT  EKG today   Medication Instructions:    Continue with your current medicines.     Testing/Procedures To Be Arranged:  Cardiac catheterization with Dr. Burt Knack this Friday  Follow-Up:   Will see how your cardiac cath turns out and then decide about follow up.     Other Special Instructions:   Your provider has recommended a cardiac catherization  You are scheduled for a cardiac catheterization on Friday, June 22nd at 1:30 PM with Dr. Burt Knack or associate.  Please arrive at the [facility] (Main Entrance) at [facility] at [address] [place] on Friday, June 22nd at 11:30 AM.    Special note: Every effort is made to have your procedure done on time.   Please understand that emergencies sometimes delay a scheduled   procedure.  No food or drink after midnight on Thursday.  You may take your morning medications with a sip of water on the day of your procedure.  Medications to HOLD Friday morning - Glucotrol and Insulin Take only 1/2 dose insulin Thursday night  Plan for a one night stay -- bring personal belongings.  Bring a current list of your medications and current insurance cards.  You MUST have a responsible person to drive you home. Someone MUST be with you the first 24 hours after you arrive home or your discharge will be delayed. Wear clothes that are easy to get on and off and wear slip on shoes.    Coronary Angiogram A coronary angiogram, also called coronary angiography, is an X-ray procedure used to look at the arteries in the heart. In this procedure, a dye (contrast dye) is injected through a long, hollow tube (catheter). The catheter is about the size of a piece of cooked spaghetti and is inserted through your groin, wrist, or arm. The dye is injected into each artery, and X-rays are then taken to show if there is a blockage in the arteries of your  heart.  LET [facility] CARE PROVIDER KNOW ABOUT: Any allergies you have, including allergies to shellfish or contrast dye.  All medicines you are taking, including vitamins, herbs, eye drops, creams, and over-the-counter medicines.  Previous problems you or members of your family have had with the use of anesthetics.  Any blood disorders you have.  Previous surgeries you have had. History of kidney problems or failure.  Other medical conditions you have.  RISKS AND COMPLICATIONS  Generally, a coronary angiogram is a safe procedure. However, about 1 person out of 1000 can have problems that may include: Allergic reaction to the dye. Bleeding/bruising from the access site or other locations. Kidney injury, especially in people with impaired kidney function. Stroke (rare). Heart attack (rare). Irregular rhythms (rare) Death (rare)  BEFORE THE PROCEDURE  Do not eat or drink anything after midnight the night before the procedure or as directed by your health care provider.  Ask your health care provider about changing or stopping your regular medicines. This is especially important if you are taking diabetes medicines or blood thinners.  PROCEDURE You may be given a medicine to help you relax (sedative) before the procedure. This medicine is given through an intravenous (IV) access tube that is inserted into one of your veins.  The area where the catheter will be inserted will be washed and shaved. This is usually done in the groin but may be done  in the fold of your arm (near your elbow) or in the wrist.  A medicine will be given to numb the area where the catheter will be inserted (local anesthetic).  The health care provider will insert the catheter into an artery. The catheter will be guided by using a special type of X-ray (fluoroscopy) of the blood vessel being examined.  A special dye will then be injected into the catheter, and X-rays will be taken. The dye will help to  show where any narrowing or blockages are located in the heart arteries.   AFTER THE PROCEDURE  If the procedure is done through the leg, you will be kept in bed lying flat for several hours. You will be instructed to not bend or cross your legs. The insertion site will be checked frequently.  The pulse in your feet or wrist will be checked frequently.  Additional blood tests, X-rays, and an electrocardiogram may be done.    If you need a refill on your cardiac medications before your next appointment, please call your pharmacy.   Call the [place] office at [phone] if you have any questions, problems or concerns.
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
## 2016-11-18 LAB — CBC
Hematocrit: 36.4 % (ref 34.0–46.6)
Hemoglobin: 11.4 g/dL (ref 11.1–15.9)
MCH: 25.7 pg — ABNORMAL LOW (ref 26.6–33.0)
MCHC: 31.3 g/dL — ABNORMAL LOW (ref 31.5–35.7)
MCV: 82 fL (ref 79–97)
Platelets: 381 10*3/uL — ABNORMAL HIGH (ref 150–379)
RBC: 4.44 x10E6/uL (ref 3.77–5.28)
RDW: 14.6 % (ref 12.3–15.4)
WBC: 10.2 10*3/uL (ref 3.4–10.8)

## 2016-11-18 LAB — PROTIME-INR
INR: 0.9 (ref 0.8–1.2)
Prothrombin Time: 10 s (ref 9.1–12.0)

## 2016-11-18 LAB — BASIC METABOLIC PANEL
BUN/Creatinine Ratio: 22 (ref 12–28)
BUN: 24 mg/dL (ref 8–27)
CO2: 24 mmol/L (ref 20–29)
Calcium: 9.4 mg/dL (ref 8.7–10.3)
Chloride: 97 mmol/L (ref 96–106)
Creatinine, Ser: 1.1 mg/dL — ABNORMAL HIGH (ref 0.57–1.00)
GFR calc Af Amer: 56 mL/min/{1.73_m2} — ABNORMAL LOW (ref >59)
GFR calc non Af Amer: 48 mL/min/{1.73_m2} — ABNORMAL LOW (ref >59)
Glucose: 129 mg/dL — ABNORMAL HIGH (ref 65–99)
Potassium: 3.9 mmol/L (ref 3.5–5.2)
Sodium: 139 mmol/L (ref 134–144)

## 2016-11-18 LAB — APTT: aPTT: 23 s — ABNORMAL LOW (ref 24–33)

## 2016-11-19 ENCOUNTER — Telehealth: Payer: Self-pay

## 2016-11-19 NOTE — Telephone Encounter (Signed)
Patient contacted pre-catheterization at [facility] scheduled for:  11/20/2016 @ 1330  Verified arrival time and place: Pt states she knows where to go since she has had prior caths.  Confirmed AM meds to be taken pre-cath with sip of water:  Pt states she will be holding all her am meds.  Informed Pt that she needs to take ASA 81 mg pre cath or they will give it to her in the hospital.  Pt states she will take at home.  Confirmed patient has responsible person to drive home post procedure and observe patient for 24 hours:  Pt states her brother will be driving her and staying the night.  Addl concerns:  Pt with DYE allergy and BENADRYL allergy Pt states her husband has been admitted to [facility] for diuresis.  Pt thinks Dr. Wynonia Lawman will send him to rehab after hospitalization.

## 2016-11-20 ENCOUNTER — Ambulatory Visit (HOSPITAL_COMMUNITY)
Admission: RE | Admit: 2016-11-20 | Discharge: 2016-11-20 | Disposition: A | Discharge status: Home / Self Care | Payer: Medicare Other | Source: Ambulatory Visit | Attending: Cardiovascular Disease | Admitting: Cardiovascular Disease

## 2016-11-20 ENCOUNTER — Ambulatory Visit (HOSPITAL_COMMUNITY)
Admission: RE | Disposition: A | Discharge status: Home / Self Care | Payer: Self-pay | Source: Ambulatory Visit | Attending: Cardiovascular Disease

## 2016-11-20 DIAGNOSIS — Z6837 Body mass index (BMI) 37.0-37.9, adult: Secondary | ICD-10-CM | POA: Insufficient documentation

## 2016-11-20 DIAGNOSIS — E039 Hypothyroidism, unspecified: Secondary | ICD-10-CM | POA: Diagnosis not present

## 2016-11-20 DIAGNOSIS — E669 Obesity, unspecified: Secondary | ICD-10-CM | POA: Diagnosis not present

## 2016-11-20 DIAGNOSIS — E7800 Pure hypercholesterolemia, unspecified: Secondary | ICD-10-CM | POA: Diagnosis not present

## 2016-11-20 DIAGNOSIS — J449 Chronic obstructive pulmonary disease, unspecified: Secondary | ICD-10-CM | POA: Diagnosis not present

## 2016-11-20 DIAGNOSIS — G43919 Migraine, unspecified, intractable, without status migrainosus: Secondary | ICD-10-CM | POA: Diagnosis not present

## 2016-11-20 DIAGNOSIS — G3184 Mild cognitive impairment, so stated: Secondary | ICD-10-CM | POA: Diagnosis not present

## 2016-11-20 DIAGNOSIS — Z9884 Bariatric surgery status: Secondary | ICD-10-CM | POA: Insufficient documentation

## 2016-11-20 DIAGNOSIS — R079 Chest pain, unspecified: Secondary | ICD-10-CM | POA: Diagnosis not present

## 2016-11-20 DIAGNOSIS — I10 Essential (primary) hypertension: Secondary | ICD-10-CM | POA: Insufficient documentation

## 2016-11-20 DIAGNOSIS — Z7952 Long term (current) use of systemic steroids: Secondary | ICD-10-CM | POA: Insufficient documentation

## 2016-11-20 DIAGNOSIS — Z7982 Long term (current) use of aspirin: Secondary | ICD-10-CM | POA: Diagnosis not present

## 2016-11-20 DIAGNOSIS — F329 Major depressive disorder, single episode, unspecified: Secondary | ICD-10-CM | POA: Insufficient documentation

## 2016-11-20 DIAGNOSIS — I5181 Takotsubo syndrome: Secondary | ICD-10-CM | POA: Insufficient documentation

## 2016-11-20 DIAGNOSIS — Z885 Allergy status to narcotic agent status: Secondary | ICD-10-CM | POA: Insufficient documentation

## 2016-11-20 DIAGNOSIS — I2510 Atherosclerotic heart disease of native coronary artery without angina pectoris: Secondary | ICD-10-CM | POA: Insufficient documentation

## 2016-11-20 DIAGNOSIS — M109 Gout, unspecified: Secondary | ICD-10-CM | POA: Insufficient documentation

## 2016-11-20 DIAGNOSIS — K210 Gastro-esophageal reflux disease with esophagitis: Secondary | ICD-10-CM | POA: Diagnosis not present

## 2016-11-20 DIAGNOSIS — Z794 Long term (current) use of insulin: Secondary | ICD-10-CM | POA: Diagnosis not present

## 2016-11-20 DIAGNOSIS — E119 Type 2 diabetes mellitus without complications: Secondary | ICD-10-CM | POA: Insufficient documentation

## 2016-11-20 DIAGNOSIS — E559 Vitamin D deficiency, unspecified: Secondary | ICD-10-CM | POA: Insufficient documentation

## 2016-11-20 HISTORY — PX: LEFT HEART CATH AND CORONARY ANGIOGRAPHY: CATH118249

## 2016-11-20 LAB — GLUCOSE, CAPILLARY
GLUCOSE-CAPILLARY: 156 mg/dL — AB (ref 65–99)
Glucose-Capillary: 118 mg/dL — ABNORMAL HIGH (ref 65–99)

## 2016-11-20 SURGERY — LEFT HEART CATH AND CORONARY ANGIOGRAPHY
Anesthesia: LOCAL

## 2016-11-20 MED ORDER — HEPARIN SODIUM (PORCINE) 1000 UNIT/ML IJ SOLN
INTRAMUSCULAR | Status: DC | PRN
  Administered 2016-11-20: 5000 [IU] via INTRAVENOUS

## 2016-11-20 MED ORDER — SODIUM CHLORIDE 0.9% FLUSH: 3.0000 mL | INTRAVENOUS | Status: DC | PRN

## 2016-11-20 MED ORDER — HEPARIN SODIUM (PORCINE) 1000 UNIT/ML IJ SOLN
INTRAMUSCULAR | Status: AC
  Filled 2016-11-20: qty 1

## 2016-11-20 MED ORDER — MIDAZOLAM HCL 2 MG/2ML IJ SOLN
INTRAMUSCULAR | Status: DC | PRN
  Administered 2016-11-20: 1 mg via INTRAVENOUS

## 2016-11-20 MED ORDER — ASPIRIN 81 MG PO CHEW: 81.0000 mg | CHEWABLE_TABLET | ORAL | Status: DC

## 2016-11-20 MED ORDER — SODIUM CHLORIDE 0.9% FLUSH: 3.0000 mL | Freq: Two times a day (BID) | INTRAVENOUS | Status: DC

## 2016-11-20 MED ORDER — VERAPAMIL HCL 2.5 MG/ML IV SOLN
INTRAVENOUS | Status: AC
  Filled 2016-11-20: qty 2

## 2016-11-20 MED ORDER — LIDOCAINE HCL (PF) 1 % IJ SOLN
INTRAMUSCULAR | Status: AC
  Filled 2016-11-20: qty 30

## 2016-11-20 MED ORDER — ONDANSETRON HCL 4 MG/2ML IJ SOLN: 4.0000 mg | Freq: Four times a day (QID) | INTRAMUSCULAR | Status: DC | PRN

## 2016-11-20 MED ORDER — SODIUM CHLORIDE 0.9 % WEIGHT BASED INFUSION: 1.0000 mL/kg/h | INTRAVENOUS | Status: DC

## 2016-11-20 MED ORDER — HEPARIN (PORCINE) IN NACL 2-0.9 UNIT/ML-% IJ SOLN
INTRAMUSCULAR | Status: AC
  Filled 2016-11-20: qty 1000

## 2016-11-20 MED ORDER — ACETAMINOPHEN 325 MG PO TABS: 650.0000 mg | ORAL_TABLET | ORAL | Status: DC | PRN

## 2016-11-20 MED ORDER — METHYLPREDNISOLONE SODIUM SUCC 125 MG IJ SOLR
125.0000 mg | INTRAMUSCULAR | Status: AC
  Administered 2016-11-20: 125 mg via INTRAVENOUS

## 2016-11-20 MED ORDER — IOPAMIDOL (ISOVUE-370) INJECTION 76%
INTRAVENOUS | Status: DC | PRN
  Administered 2016-11-20: 40 mL via INTRA_ARTERIAL

## 2016-11-20 MED ORDER — FAMOTIDINE IN NACL 20-0.9 MG/50ML-% IV SOLN
20.0000 mg | INTRAVENOUS | Status: AC
  Administered 2016-11-20: 20 mg via INTRAVENOUS

## 2016-11-20 MED ORDER — FENTANYL CITRATE (PF) 100 MCG/2ML IJ SOLN
INTRAMUSCULAR | Status: AC
  Filled 2016-11-20: qty 2

## 2016-11-20 MED ORDER — SODIUM CHLORIDE 0.9 % IV SOLN
INTRAVENOUS | Status: DC
  Administered 2016-11-20: 13:00:00 via INTRAVENOUS

## 2016-11-20 MED ORDER — HEPARIN (PORCINE) IN NACL 2-0.9 UNIT/ML-% IJ SOLN
INTRAMUSCULAR | Status: AC | PRN
  Administered 2016-11-20: 1000 mL

## 2016-11-20 MED ORDER — HEPARIN (PORCINE) IN NACL 2-0.9 UNIT/ML-% IJ SOLN
INTRAMUSCULAR | Status: DC | PRN
  Administered 2016-11-20: 10 mL via INTRA_ARTERIAL

## 2016-11-20 MED ORDER — METHYLPREDNISOLONE SODIUM SUCC 125 MG IJ SOLR
INTRAMUSCULAR | Status: AC
  Administered 2016-11-20: 125 mg via INTRAVENOUS
  Filled 2016-11-20: qty 2

## 2016-11-20 MED ORDER — SODIUM CHLORIDE 0.9 % IV SOLN: 250.0000 mL | INTRAVENOUS | Status: DC | PRN

## 2016-11-20 MED ORDER — MIDAZOLAM HCL 2 MG/2ML IJ SOLN
INTRAMUSCULAR | Status: AC
  Filled 2016-11-20: qty 2

## 2016-11-20 MED ORDER — FAMOTIDINE IN NACL 20-0.9 MG/50ML-% IV SOLN
INTRAVENOUS | Status: AC
  Administered 2016-11-20: 20 mg via INTRAVENOUS
  Filled 2016-11-20: qty 50

## 2016-11-20 MED ORDER — FENTANYL CITRATE (PF) 100 MCG/2ML IJ SOLN
INTRAMUSCULAR | Status: DC | PRN
  Administered 2016-11-20: 25 ug via INTRAVENOUS

## 2016-11-20 SURGICAL SUPPLY — 12 items
CATH 5FR JL3.5 JR4 ANG PIG MP (CATHETERS) ×1 IMPLANT
CATH LAUNCHER 5F JR4 (CATHETERS) ×1 IMPLANT
DEVICE RAD COMP TR BAND LRG (VASCULAR PRODUCTS) ×1 IMPLANT
GLIDESHEATH SLEND SS 6F .021 (SHEATH) ×1 IMPLANT
GUIDEWIRE ANGLED .035X150CM (WIRE) ×1 IMPLANT
GUIDEWIRE INQWIRE 1.5J.035X260 (WIRE) IMPLANT
INQWIRE 1.5J .035X260CM (WIRE) ×2
KIT HEART LEFT (KITS) ×2 IMPLANT
PACK CARDIAC CATHETERIZATION (CUSTOM PROCEDURE TRAY) ×2 IMPLANT
TRANSDUCER W/STOPCOCK (MISCELLANEOUS) ×2 IMPLANT
TUBING CIL FLEX 10 FLL-RA (TUBING) ×2 IMPLANT
WIRE HI TORQ VERSACORE-J 145CM (WIRE) ×1 IMPLANT

## 2016-11-20 NOTE — Discharge Instructions (Signed)

## 2016-11-20 NOTE — Interval H&P Note (Signed)
History and Physical Interval Note:  11/20/2016 2:49 PM  [name]  has presented today for surgery, with the diagnosis of unstable angina  The various methods of treatment have been discussed with the patient and family. After consideration of risks, benefits and other options for treatment, the patient has consented to  Procedure(s): Left Heart Cath and Coronary Angiography (N/A) as a surgical intervention .  The patient's history has been reviewed, patient examined, no change in status, stable for surgery.  I have reviewed the patient's chart and labs.  Questions were answered to the patient's satisfaction.     Sherren Mocha
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
## 2016-11-23 ENCOUNTER — Other Ambulatory Visit: Payer: Self-pay | Admitting: *Deleted

## 2016-11-23 ENCOUNTER — Telehealth: Payer: Self-pay | Admitting: Nurse Practitioner

## 2016-11-23 ENCOUNTER — Encounter (HOSPITAL_COMMUNITY): Payer: Self-pay | Admitting: Cardiovascular Disease

## 2016-11-23 MED ORDER — ISOSORBIDE MONONITRATE ER 30 MG PO TB24: 30.0000 mg | ORAL_TABLET | Freq: Every day | ORAL | 3 refills | Status: DC

## 2016-11-23 MED FILL — Lidocaine HCl Local Preservative Free (PF) Inj 1%: INTRAMUSCULAR | Qty: 30 | Status: AC

## 2016-11-23 NOTE — Telephone Encounter (Signed)
Would continue for now. She does need to see her PCP if chest pain continues.

## 2016-11-23 NOTE — Telephone Encounter (Signed)
Let pt know of Lori's recommendation's. F/u with PCP for further CP.

## 2016-11-23 NOTE — Telephone Encounter (Signed)
S/w pt is aware to stay on imdur (30 mg ) daily unless otherwise told to come off of med. Pt requested [facility] day [facility] to [facility] [place]. Sent in today.  Pt also stated Heart Cath was ok that's why pt was calling to make sure pt had to stay on medication. Will send to [place] to [place].

## 2016-11-23 NOTE — Telephone Encounter (Signed)
Patient calling, states that she had a heart cath and was prescribed "heart medication." Patient would like to know if she should continue taking medication, if so she will need a 3 momth supply called into [facility] in [place].

## 2016-12-09 ENCOUNTER — Telehealth: Payer: Self-pay | Admitting: Nurse Practitioner
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
## 2016-12-28 ENCOUNTER — Ambulatory Visit: Payer: Medicare Other | Admitting: Nurse Practitioner

## 2017-02-03 ENCOUNTER — Telehealth: Payer: Self-pay | Admitting: Nurse Practitioner

## 2017-02-03 NOTE — Telephone Encounter (Signed)
Pt called ,stating that she has been having SOB and chest pain for 2 weeks. Pt takes NTG SL for the pain to stop. The pain happened at rest or with activity and she has it during the night. No chest pain at this time. Pt would like to see Truitt Merle NP because she knows her and all her issues. Cecille Rubin is aware of symptoms, she recommends for pt to see her PCP. NP states that pt's last cardiac cath results is  "widely patent coronary arteries" She recommends for pt  to start with her PCP first. Pt is aware she verbalized understanding.

## 2017-02-03 NOTE — Telephone Encounter (Signed)
New Message     Pt c/o of Chest Pain: STAT if CP now or developed within 24 hours  1. Are you having CP right now?  yes  2. Are you experiencing any other symptoms (ex. SOB, nausea, vomiting, sweating)? Dizziness , sob and nausea   3. How long have you been experiencing CP? A couple weeks  4. Is your CP continuous or coming and going?  It happens every time she tries to walk anywhere   5. Have you taken Nitroglycerin?  Taking 2 at a time  ?

## 2017-02-05 ENCOUNTER — Other Ambulatory Visit: Payer: Self-pay | Admitting: Internal Medicine

## 2017-02-05 DIAGNOSIS — M545 Low back pain: Principal | ICD-10-CM

## 2017-02-05 DIAGNOSIS — G8929 Other chronic pain: Secondary | ICD-10-CM

## 2017-02-09 ENCOUNTER — Ambulatory Visit: Payer: Medicare Other | Admitting: Nurse Practitioner

## 2017-02-17 ENCOUNTER — Other Ambulatory Visit: Payer: Self-pay | Admitting: Internal Medicine

## 2017-02-17 DIAGNOSIS — Z794 Long term (current) use of insulin: Principal | ICD-10-CM

## 2017-02-17 DIAGNOSIS — E113513 Type 2 diabetes mellitus with proliferative diabetic retinopathy with macular edema, bilateral: Secondary | ICD-10-CM

## 2017-03-13 ENCOUNTER — Other Ambulatory Visit: Payer: Self-pay | Admitting: Internal Medicine

## 2017-03-17 ENCOUNTER — Ambulatory Visit (INDEPENDENT_AMBULATORY_CARE_PROVIDER_SITE_OTHER): Payer: Medicare Other | Admitting: Nurse Practitioner

## 2017-03-17 ENCOUNTER — Encounter: Payer: Self-pay | Admitting: Nurse Practitioner

## 2017-03-17 VITALS — BP 140/84 | HR 94 | Temp 98.0°F | Resp 17 | Ht 62.0 in | Wt 203.8 lb

## 2017-03-17 DIAGNOSIS — Z23 Encounter for immunization: Secondary | ICD-10-CM

## 2017-03-17 DIAGNOSIS — E669 Obesity, unspecified: Secondary | ICD-10-CM | POA: Diagnosis not present

## 2017-03-17 DIAGNOSIS — G894 Chronic pain syndrome: Secondary | ICD-10-CM

## 2017-03-17 DIAGNOSIS — Z794 Long term (current) use of insulin: Secondary | ICD-10-CM

## 2017-03-17 DIAGNOSIS — R5381 Other malaise: Secondary | ICD-10-CM | POA: Diagnosis not present

## 2017-03-17 DIAGNOSIS — I10 Essential (primary) hypertension: Secondary | ICD-10-CM

## 2017-03-17 DIAGNOSIS — E785 Hyperlipidemia, unspecified: Secondary | ICD-10-CM | POA: Diagnosis not present

## 2017-03-17 DIAGNOSIS — F329 Major depressive disorder, single episode, unspecified: Secondary | ICD-10-CM

## 2017-03-17 DIAGNOSIS — I209 Angina pectoris, unspecified: Secondary | ICD-10-CM

## 2017-03-17 DIAGNOSIS — I25119 Atherosclerotic heart disease of native coronary artery with unspecified angina pectoris: Secondary | ICD-10-CM

## 2017-03-17 DIAGNOSIS — F419 Anxiety disorder, unspecified: Secondary | ICD-10-CM | POA: Diagnosis not present

## 2017-03-17 DIAGNOSIS — E113513 Type 2 diabetes mellitus with proliferative diabetic retinopathy with macular edema, bilateral: Secondary | ICD-10-CM | POA: Diagnosis not present

## 2017-03-17 DIAGNOSIS — I208 Other forms of angina pectoris: Secondary | ICD-10-CM

## 2017-03-17 NOTE — Patient Instructions (Signed)
DASH Eating Plan DASH stands for "Dietary Approaches to Stop Hypertension." The DASH eating plan is a healthy eating plan that has been shown to reduce high blood pressure (hypertension). It may also reduce your risk for type 2 diabetes, heart disease, and stroke. The DASH eating plan may also help with weight loss. What are tips for following this plan? General guidelines  Avoid eating more than 2,300 mg (milligrams) of salt (sodium) a day. If you have hypertension, you may need to reduce your sodium intake to 1,500 mg a day.  Limit alcohol intake to no more than 1 drink a day for nonpregnant women and 2 drinks a day for men. One drink equals 12 oz of beer, 5 oz of wine, or 1 oz of hard liquor.  Work with your health care provider to maintain a healthy body weight or to lose weight. Ask what an ideal weight is for you.  Get at least 30 minutes of exercise that causes your heart to beat faster (aerobic exercise) most days of the week. Activities may include walking, swimming, or biking.  Work with your health care provider or diet and nutrition specialist (dietitian) to adjust your eating plan to your individual calorie needs. Reading food labels  Check food labels for the amount of sodium per serving. Choose foods with less than 5 percent of the Daily Value of sodium. Generally, foods with less than 300 mg of sodium per serving fit into this eating plan.  To find whole grains, look for the word "whole" as the first word in the ingredient list. Shopping  Buy products labeled as "low-sodium" or "no salt added."  Buy fresh foods. Avoid canned foods and premade or frozen meals. Cooking  Avoid adding salt when cooking. Use salt-free seasonings or herbs instead of table salt or sea salt. Check with your health care provider or pharmacist before using salt substitutes.  Do not fry foods. Cook foods using healthy methods such as baking, boiling, grilling, and broiling instead.  Cook with  heart-healthy oils, such as olive, canola, soybean, or sunflower oil. Meal planning   Eat a balanced diet that includes: ? 5 or more servings of fruits and vegetables each day. At each meal, try to fill half of your plate with fruits and vegetables. ? Up to 6-8 servings of whole grains each day. ? Less than 6 oz of lean meat, poultry, or fish each day. A 3-oz serving of meat is about the same size as a deck of cards. One egg equals 1 oz. ? 2 servings of low-fat dairy each day. ? A serving of nuts, seeds, or beans 5 times each week. ? Heart-healthy fats. Healthy fats called Omega-3 fatty acids are found in foods such as flaxseeds and coldwater fish, like sardines, salmon, and mackerel.  Limit how much you eat of the following: ? Canned or prepackaged foods. ? Food that is high in trans fat, such as fried foods. ? Food that is high in saturated fat, such as fatty meat. ? Sweets, desserts, sugary drinks, and other foods with added sugar. ? Full-fat dairy products.  Do not salt foods before eating.  Try to eat at least 2 vegetarian meals each week.  Eat more home-cooked food and less restaurant, buffet, and fast food.  When eating at a restaurant, ask that your food be prepared with less salt or no salt, if possible. What foods are recommended? The items listed may not be a complete list. Talk with your dietitian about what   dietary choices are best for you. Grains Whole-grain or whole-wheat bread. Whole-grain or whole-wheat pasta. Brown rice. Oatmeal. Quinoa. Bulgur. Whole-grain and low-sodium cereals. Pita bread. Low-fat, low-sodium crackers. Whole-wheat flour tortillas. Vegetables Fresh or frozen vegetables (raw, steamed, roasted, or grilled). Low-sodium or reduced-sodium tomato and vegetable juice. Low-sodium or reduced-sodium tomato sauce and tomato paste. Low-sodium or reduced-sodium canned vegetables. Fruits All fresh, dried, or frozen fruit. Canned fruit in natural juice (without  added sugar). Meat and other protein foods Skinless chicken or turkey. Ground chicken or turkey. Pork with fat trimmed off. Fish and seafood. Egg whites. Dried beans, peas, or lentils. Unsalted nuts, nut butters, and seeds. Unsalted canned beans. Lean cuts of beef with fat trimmed off. Low-sodium, lean deli meat. Dairy Low-fat (1%) or fat-free (skim) milk. Fat-free, low-fat, or reduced-fat cheeses. Nonfat, low-sodium ricotta or cottage cheese. Low-fat or nonfat yogurt. Low-fat, low-sodium cheese. Fats and oils Soft margarine without trans fats. Vegetable oil. Low-fat, reduced-fat, or light mayonnaise and salad dressings (reduced-sodium). Canola, safflower, olive, soybean, and sunflower oils. Avocado. Seasoning and other foods Herbs. Spices. Seasoning mixes without salt. Unsalted popcorn and pretzels. Fat-free sweets. What foods are not recommended? The items listed may not be a complete list. Talk with your dietitian about what dietary choices are best for you. Grains Baked goods made with fat, such as croissants, muffins, or some breads. Dry pasta or rice meal packs. Vegetables Creamed or fried vegetables. Vegetables in a cheese sauce. Regular canned vegetables (not low-sodium or reduced-sodium). Regular canned tomato sauce and paste (not low-sodium or reduced-sodium). Regular tomato and vegetable juice (not low-sodium or reduced-sodium). Pickles. Olives. Fruits Canned fruit in a light or heavy syrup. Fried fruit. Fruit in cream or butter sauce. Meat and other protein foods Fatty cuts of meat. Ribs. Fried meat. Bacon. Sausage. Bologna and other processed lunch meats. Salami. Fatback. Hotdogs. Bratwurst. Salted nuts and seeds. Canned beans with added salt. Canned or smoked fish. Whole eggs or egg yolks. Chicken or turkey with skin. Dairy Whole or 2% milk, cream, and half-and-half. Whole or full-fat cream cheese. Whole-fat or sweetened yogurt. Full-fat cheese. Nondairy creamers. Whipped toppings.  Processed cheese and cheese spreads. Fats and oils Butter. Stick margarine. Lard. Shortening. Ghee. Bacon fat. Tropical oils, such as coconut, palm kernel, or palm oil. Seasoning and other foods Salted popcorn and pretzels. Onion salt, garlic salt, seasoned salt, table salt, and sea salt. Worcestershire sauce. Tartar sauce. Barbecue sauce. Teriyaki sauce. Soy sauce, including reduced-sodium. Steak sauce. Canned and packaged gravies. Fish sauce. Oyster sauce. Cocktail sauce. Horseradish that you find on the shelf. Ketchup. Mustard. Meat flavorings and tenderizers. Bouillon cubes. Hot sauce and Tabasco sauce. Premade or packaged marinades. Premade or packaged taco seasonings. Relishes. Regular salad dressings. Where to find more information:  National Heart, Lung, and Blood Institute: www.nhlbi.nih.gov  American Heart Association: www.heart.org Summary  The DASH eating plan is a healthy eating plan that has been shown to reduce high blood pressure (hypertension). It may also reduce your risk for type 2 diabetes, heart disease, and stroke.  With the DASH eating plan, you should limit salt (sodium) intake to 2,300 mg a day. If you have hypertension, you may need to reduce your sodium intake to 1,500 mg a day.  When on the DASH eating plan, aim to eat more fresh fruits and vegetables, whole grains, lean proteins, low-fat dairy, and heart-healthy fats.  Work with your health care provider or diet and nutrition specialist (dietitian) to adjust your eating plan to your individual   calorie needs. This information is not intended to replace advice given to you by your health care provider. Make sure you discuss any questions you have with your health care provider. Document Released: 05/07/2011 Document Revised: 05/11/2016 Document Reviewed: 05/11/2016 Elsevier Interactive Patient Education  2017 [employer]vier Inc.  

## 2017-03-17 NOTE — Progress Notes (Signed)
Careteam: Patient Care Team: Lauree Chandler, NP as PCP - General (Geriatric Medicine) Excell Seltzer, MD as Consulting Physician (General Surgery) Tanda Rockers, MD as Consulting Physician (Pulmonary Disease) Ronald Lobo, MD as Consulting Physician (Gastroenterology) Jacolyn Reedy, MD as Consulting Physician (Cardiology)  Advanced Directive information Does Patient Have a Medical Advance Directive?: No  Allergies  Allergen Reactions  . Ciprofloxacin Rash  . Advair Diskus [Fluticasone-Salmeterol] Other (See Comments)    Throat closes  . Albuterol     REACTION: closes throat  . Alendronate Sodium Itching  . Benadryl [Diphenhydramine Hcl] Itching  . Benzonatate     REACTION: rash/hives  . Cephalexin Nausea Only  . Gabapentin Other (See Comments)    Loss of memory  . Iohexol      Desc: rash and DIF BREATHING   . Keflex [Cephalexin] Nausea And Vomiting  . Morphine Nausea And Vomiting  . Other Other (See Comments)    Decongestants- keeps pt awake at night, increased heart rate  . Propoxyphene Hcl     Doesn't recall  . Reclast [Zoledronic Acid] Other (See Comments)    Chest pain  . Avapro [Irbesartan] Rash  . Codeine Nausea And Vomiting, Swelling and Rash  . Tessalon Perles Rash  . Tramadol Nausea Only and Rash    Chief Complaint  Patient presents with  . Medical Management of Chronic Issues    Pt is being seen for a routine visit. Pt reports that she has ongoing bilateral hip (left worse than right) and back pain that has gotten worse recently. Pt also reports having no energy for last 2 months.      HPI: Patient is a 76 y.o. female seen in the office today for routine follow up. She says she has been under a lot of stress. She complains of bilateral hip pain with the left being worse than the right. She says she does not have any medication to take for the pain. She rates her pain as an 8/10. She says she is not able to stand for very long. She has  tried heating pads and ice packs at home with no relief. Unable to cook due to pain. Unable to do any exercise due to pain. Taking prednisone 5 mg daily for pain.   She says that her husband has not been doing well in nursing home. Husband's daughter took her to [place] with her to take care of him. She says she has been by herself since June this year. She says she is stressed out because she is alone all the time.  Patient says she was in the hospital 6/19 for a heart catheterization. She was told there was no change from previous cath and that her pain was caused by stress and GERD. She was seen by Dr. Burt Knack for the heart cath. She says that she has had some chest pains that she thinks is heart burn that occurs "now and then." The last time she had an occurrence was last week with an episode of feeling like her throat closes up. She says she takes a breath and it goes away.  DM - Patient states she has been taking 35 Units of Humulin in the morning and before bed. She says she keeps a tangerine by her bedside. She says she wakes up during night and eats it because " I know my blood sugar is low." She does not check her blood sugar during this time. Does not have any symptoms.  HTN - Controled with losartan, HTCZ and Norvasc. BP to day 140/84. She did not take her medication this morning.   Hyperlipidemia - No current medication. She states she can no longer stand to cook and has been "eating out of the freezer." She says she eats a lot of frozen pot pies.  Obesity - Weight is unchanged since last visit.  Review of Systems:  Review of Systems  Constitutional: Negative for chills, fever and weight loss.  HENT: Negative for congestion, ear pain, hearing loss, sinus pain and sore throat.   Eyes: Negative for blurred vision and pain.  Respiratory: Negative for cough and shortness of breath.   Cardiovascular: Negative for chest pain, palpitations and leg swelling.  Gastrointestinal: Negative for  abdominal pain, constipation, diarrhea, heartburn and nausea.  Genitourinary: Negative for dysuria and frequency.  Musculoskeletal: Positive for back pain (lower back) and joint pain (bilateral hip). Negative for myalgias.  Neurological: Negative for dizziness and headaches.  Psychiatric/Behavioral: Negative for suicidal ideas. The patient is not nervous/anxious.     Past Medical History:  Diagnosis Date  . Abdominal pain, unspecified site   . Abnormality of gait   . Apnea   . Cardiomegaly   . Chest pain, unspecified   . Complication of anesthesia    hard time waking up  . Depressive disorder, not elsewhere classified   . Diabetes mellitus   . Dizziness and giddiness   . Dyskinesia of esophagus   . Edema   . Extrinsic asthma, unspecified   . Female stress incontinence   . Gout, unspecified   . High cholesterol   . Hypertension   . Lipoma of other skin and subcutaneous tissue   . Lumbago   . Memory loss   . Migraine without aura, with intractable migraine, so stated, with status migrainosus   . Mild cognitive impairment, so stated   . Nonspecific (abnormal) findings on radiological and other examination of abdominal area, including retroperitoneum   . Nonspecific abnormal results of liver function study   . Obesity, unspecified   . Obstructive chronic bronchitis with exacerbation ([place])   . Other and unspecified hyperlipidemia   . Other B-complex deficiencies   . Other nonspecific abnormal serum enzyme levels   . Other specified cardiac dysrhythmias(427.89)   . Other specified disease of sebaceous glands   . Other symptoms involving cardiovascular system   . Pain in joint, ankle and foot   . Pain in joint, pelvic region and thigh   . Pain in joint, shoulder region   . Palpitations   . Reflux esophagitis   . Shortness of breath   . Tension headache   . Type I (juvenile type) diabetes mellitus without mention of complication, not stated as uncontrolled   . Type I  (juvenile type) diabetes mellitus without mention of complication, uncontrolled   . Unspecified essential hypertension   . Unspecified hypothyroidism   . Unspecified vitamin D deficiency   . Ventral hernia, unspecified, without mention of obstruction or gangrene    Past Surgical History:  Procedure Laterality Date  . ABDOMINAL HYSTERECTOMY    . APPENDECTOMY    . BREAST SURGERY    . EXPLORATORY LAPAROTOMY W/ BOWEL RESECTION  07/25/2004   PROCEDURE: Laparoscopy, open laparotomy, resection of jejunojejunostomy  . INCISIONAL HERNIA REPAIR  03/19/2006   PROCEDURE: Open ventral hernia repair with mesh.  Marland Kitchen LAPAROSCOPIC GASTRIC BYPASS  07/22/2004   PROCEDURE: Laparoscopic Roux-en-Y gastric bypass, antecolic, antegastric,  . LAPAROSCOPIC INCISIONAL / UMBILICAL / VENTRAL  HERNIA REPAIR  09/22/2005   PROCEDURE: Laparoscopic ventral hernia repair with mesh.  Marland Kitchen LEFT HEART CATH AND CORONARY ANGIOGRAPHY N/A 11/20/2016   Procedure: Left Heart Cath and Coronary Angiography;  Surgeon: Sherren Mocha, MD;  Location: [place] [facility];  Service: Cardiovascular;  Laterality: N/A;  . LEFT HEART CATHETERIZATION WITH CORONARY ANGIOGRAM N/A 08/21/2014   Procedure: LEFT HEART CATHETERIZATION WITH CORONARY ANGIOGRAM;  Surgeon: Sherren Mocha, MD;  Location: [facility];  Service: Cardiovascular;  Laterality: N/A;  . stents     last time she said was 1 yr ago    Social History:   reports that she has never smoked. She has never used smokeless tobacco. She reports that she does not drink alcohol or use drugs.  Family History  Problem Relation Age of Onset  . Stroke Mother   . Stroke Father   . Diabetes Father   . Heart disease Father   . Diabetes Son   . Cancer Brother        BLADDER  . Diabetes Brother     Medications: Patient's Medications  New Prescriptions   No medications on file  Previous Medications   AMLODIPINE (NORVASC) 2.5 MG TABLET    TAKE 1 TABLET (2.5 MG TOTAL) BY MOUTH DAILY.    ASPIRIN 81 MG TABLET    Take 81 mg by mouth daily.    CETIRIZINE (ZYRTEC) 10 MG TABLET    Take 10 mg by mouth daily as needed for allergies. For allergies   CHOLECALCIFEROL (VITAMIN D3) 5000 UNITS CAPS    Take 1 tablet by mouth daily. For supplement   CINNAMON PO    Take 1,000 mg by mouth daily.   CITALOPRAM (CELEXA) 40 MG TABLET    TAKE 1 TABLET (40 MG TOTAL) BY MOUTH DAILY. 1 BY MOUTH DAILY FOR DEPRESSION   DOCUSATE SODIUM (COLACE) 100 MG CAPSULE    Take 1 capsule (100 mg total) by mouth 2 (two) times daily.   GLIPIZIDE (GLUCOTROL XL) 10 MG 24 HR TABLET    TAKE ONE TABLET BY MOUTH IN THE MORNING AND ONE TABLET IN THE EVENING TO CONTROL BLOOD SUGAR   INSULIN NPH HUMAN (HUMULIN N) 100 UNIT/ML INJECTION    Take 35 units in the morning and 35 units in the evening.   ISOSORBIDE MONONITRATE (IMDUR) 30 MG 24 HR TABLET    Take 1 tablet (30 mg total) by mouth daily.   LOSARTAN-HYDROCHLOROTHIAZIDE (HYZAAR) 50-12.5 MG TABLET    TAKE ONE TABLET BY MOUTH ONCE DAILY TO CONTROL BLOOD PRESSURE   MAGNESIUM OXIDE 250 MG TABS    Take 500 mg by mouth daily.    NITROGLYCERIN (NITROSTAT) 0.4 MG SL TABLET    Place 1 tablet (0.4 mg total) under the tongue every 5 (five) minutes as needed. For chest pain. Maximum of 3 tablets in 15 minutes.   OMEPRAZOLE (PRILOSEC) 40 MG CAPSULE    Take 1 capsule by mouth daily.   PREDNISONE (DELTASONE) 5 MG TABLET    TAKE 1 TABLET BY MOUTH EVERY MORNING TO REDUCE PAIN  Modified Medications   No medications on file  Discontinued Medications   HUMULIN N 100 UNIT/ML INJECTION    INJECT 33 UNITS IN THE MORNING AND 33 UNITS IN THE EVENING TO CONTROL BLOOD SUGAR     Physical Exam:  Vitals:   03/17/17 0841  BP: 140/84  Pulse: 94  Resp: 17  Temp: 98 F (36.7 C)  TempSrc: Oral  SpO2: 97%  Weight: 203 lb  12.8 oz (92.4 kg)  Height: '5\' 2"'  (1.575 m)   Body mass index is 37.28 kg/m.  Physical Exam  Constitutional: She is oriented to person, place, and time. She appears  well-developed and well-nourished. No distress.  HENT:  Head: Normocephalic and atraumatic.  Nose: Nose normal.  Mouth/Throat: Oropharynx is clear and moist.  Eyes: Pupils are equal, round, and reactive to light. Conjunctivae and EOM are normal. Right eye exhibits no discharge. Left eye exhibits no discharge.  Neck: Normal range of motion. Neck supple. No tracheal deviation present.  Cardiovascular: Normal rate, regular rhythm and normal heart sounds.   No murmur heard. Pulmonary/Chest: Effort normal and breath sounds normal. No respiratory distress.  Abdominal: Soft. Bowel sounds are normal. She exhibits no distension. There is no tenderness.  Musculoskeletal: Normal range of motion. She exhibits no edema, tenderness or deformity.  Neurological: She is alert and oriented to person, place, and time.  Skin: Skin is warm and dry. Capillary refill takes less than 2 seconds. She is not diaphoretic. No erythema.  Psychiatric: She has a normal mood and affect.    Labs reviewed: Basic Metabolic Panel:  Recent Labs  06/22/16 0905 11/04/16 0958 11/17/16 1054  NA 140 141 139  K 4.3 3.8 3.9  CL 104 103 97  CO2 '30 27 24  ' GLUCOSE 76 114* 129*  BUN '16 21 24  ' CREATININE 0.82 0.93 1.10*  CALCIUM 9.0 8.6 9.4  TSH  --  3.68  --    Liver Function Tests: No results for input(s): AST, ALT, ALKPHOS, BILITOT, PROT, ALBUMIN in the last 8760 hours. No results for input(s): LIPASE, AMYLASE in the last 8760 hours. No results for input(s): AMMONIA in the last 8760 hours. CBC:  Recent Labs  06/04/16 2343 11/17/16 1054  WBC 7.5 10.2  NEUTROABS 6.2  --   HGB 12.0 11.4  HCT 38.5 36.4  MCV 86.1 82  PLT 311 381*   Lipid Panel:  Recent Labs  11/04/16 0958  CHOL 235*  HDL 49*  LDLCALC 133*  TRIG 267*  CHOLHDL 4.8   TSH:  Recent Labs  11/04/16 0958  TSH 3.68   A1C: Lab Results  Component Value Date   HGBA1C 8.6 (H) 11/04/2016     Assessment/Plan 1. Essential hypertension,  benign Controlled with losartan, HCTZ, and Norvasc.  2. Coronary artery disease involving native coronary artery of native heart with angina pectoris [facility]) Has had intermittent chest pain Cardiac Catheterization 11/17/16 shows no change in condition since last heart cath. Followed by Cardiology. Cont on imdur and ASA daily   3. Anxiety and depression -not controlled, appears lonely now that her husband is gone however reported she could not care for him when he was there. Now unhappy he is gone. To cont on celexa, also information given for counseling center.    4. Hyperlipidemia, unspecified hyperlipidemia type -not currently on medication LDL not at goal. Will follow up, discussed with pt that she may need to start medications.  - Lipid Panel - CMP with eGFR  5. Obesity (BMI 30-39.9) -unchanged, encouraged diet modifications and exercise regiment  6. Type 2 diabetes mellitus with both eyes affected by proliferative retinopathy and macular edema, with long-term current use of insulin (HCC) -does not check blood sugars at home, poor diet. Reports Dr Nyoka Cowden told her to take 35 units twice daily of humulin N so that is what she has been doing. Reports low blood sugars but asymptomatic during this, states she just wakes up  at night. Instructed to take blood sugar to confirm. To bring blood sugar log to next OV - Hemoglobin A1c  7. Chronic pain syndrome -conts on prednisone, will get PT evaluation at this time.  - Ambulatory referral to Physical Therapy  8. Debility -reports she is unable to get the things done that she needs to do due to being weak and having pain. Will get referral for PT for strength training.  - Ambulatory referral to Physical Therapy  9. Flu vaccine given  Next appt: 3 months, sooner if needed Calden Dorsey K. Harle Battiest  [facility] [phone] 8 am - 5 pm) [phone] (after hours)

## 2017-03-18 ENCOUNTER — Other Ambulatory Visit: Payer: Self-pay | Admitting: Nurse Practitioner

## 2017-03-18 ENCOUNTER — Other Ambulatory Visit: Payer: Self-pay

## 2017-03-18 DIAGNOSIS — E785 Hyperlipidemia, unspecified: Secondary | ICD-10-CM

## 2017-03-18 DIAGNOSIS — Z794 Long term (current) use of insulin: Secondary | ICD-10-CM

## 2017-03-18 DIAGNOSIS — E113513 Type 2 diabetes mellitus with proliferative diabetic retinopathy with macular edema, bilateral: Secondary | ICD-10-CM

## 2017-03-18 LAB — LIPID PANEL
CHOLESTEROL: 209 mg/dL — AB (ref <200)
HDL: 47 mg/dL — ABNORMAL LOW (ref >50)
LDL Cholesterol (Calc): 124 mg/dL (calc) — ABNORMAL HIGH
Non-HDL Cholesterol (Calc): 162 mg/dL (calc) — ABNORMAL HIGH (ref <130)
Total CHOL/HDL Ratio: 4.4 (calc) (ref <5.0)
Triglycerides: 248 mg/dL — ABNORMAL HIGH (ref <150)

## 2017-03-18 LAB — COMPLETE METABOLIC PANEL WITH GFR
AG Ratio: 1.5 (calc) (ref 1.0–2.5)
ALBUMIN MSPROF: 3.7 g/dL (ref 3.6–5.1)
ALT: 12 U/L (ref 6–29)
AST: 11 U/L (ref 10–35)
Alkaline phosphatase (APISO): 81 U/L (ref 33–130)
BUN: 16 mg/dL (ref 7–25)
CALCIUM: 9.2 mg/dL (ref 8.6–10.4)
CO2: 31 mmol/L (ref 20–32)
CREATININE: 0.76 mg/dL (ref 0.60–0.93)
Chloride: 101 mmol/L (ref 98–110)
GFR, EST NON AFRICAN AMERICAN: 75 mL/min/{1.73_m2} (ref >=60)
GFR, Est African American: 87 mL/min/{1.73_m2} (ref >=60)
GLOBULIN: 2.5 g/dL (ref 1.9–3.7)
Glucose, Bld: 186 mg/dL — ABNORMAL HIGH (ref 65–99)
Potassium: 4.4 mmol/L (ref 3.5–5.3)
SODIUM: 139 mmol/L (ref 135–146)
Total Bilirubin: 0.5 mg/dL (ref 0.2–1.2)
Total Protein: 6.2 g/dL (ref 6.1–8.1)

## 2017-03-18 LAB — HEMOGLOBIN A1C
HEMOGLOBIN A1C: 7.9 %{Hb} — AB (ref <5.7)
MEAN PLASMA GLUCOSE: 180 (calc)
eAG (mmol/L): 10 (calc)

## 2017-03-18 MED ORDER — ATORVASTATIN CALCIUM 10 MG PO TABS: 10.0000 mg | ORAL_TABLET | Freq: Every day | ORAL | 3 refills | Status: DC

## 2017-03-18 NOTE — Telephone Encounter (Signed)
Medication list updated due to lab results. Rx sent to pharmacy.

## 2017-03-22 ENCOUNTER — Other Ambulatory Visit: Payer: Self-pay

## 2017-03-22 MED ORDER — ATORVASTATIN CALCIUM 10 MG PO TABS: 10.0000 mg | ORAL_TABLET | Freq: Every day | ORAL | 1 refills | Status: DC

## 2017-03-22 NOTE — Telephone Encounter (Signed)
Patient stated that she needs a 90 day supply of medication based on her insurance. Rx for #90 with 1 refill was sent to pharmacy.

## 2017-04-03 ENCOUNTER — Other Ambulatory Visit: Payer: Self-pay | Admitting: Internal Medicine

## 2017-04-03 DIAGNOSIS — H543 Unqualified visual loss, both eyes: Principal | ICD-10-CM

## 2017-04-03 DIAGNOSIS — E113513 Type 2 diabetes mellitus with proliferative diabetic retinopathy with macular edema, bilateral: Secondary | ICD-10-CM

## 2017-04-20 ENCOUNTER — Ambulatory Visit: Payer: Medicare Other | Admitting: Physical Therapy

## 2017-04-27 ENCOUNTER — Ambulatory Visit: Payer: Medicare Other | Attending: Nurse Practitioner

## 2017-04-27 DIAGNOSIS — R262 Difficulty in walking, not elsewhere classified: Secondary | ICD-10-CM

## 2017-04-27 DIAGNOSIS — M545 Low back pain, unspecified: Secondary | ICD-10-CM

## 2017-04-27 DIAGNOSIS — R293 Abnormal posture: Secondary | ICD-10-CM

## 2017-04-27 DIAGNOSIS — G8929 Other chronic pain: Secondary | ICD-10-CM | POA: Diagnosis not present

## 2017-04-27 DIAGNOSIS — M25552 Pain in left hip: Secondary | ICD-10-CM | POA: Diagnosis not present

## 2017-04-27 DIAGNOSIS — M25551 Pain in right hip: Secondary | ICD-10-CM | POA: Diagnosis not present

## 2017-04-27 DIAGNOSIS — M62830 Muscle spasm of back: Secondary | ICD-10-CM

## 2017-04-27 NOTE — Therapy (Signed)
[place], [place], [address] Phone: [phone]   Fax:  [phone]  Physical Therapy Evaluation  Patient Details  Name: [name] MRN: [id number] Date of Birth: [birth year] Referring Provider: Sherrie Mustache, NP   Encounter Date: 04/27/2017  PT End of Session - 04/27/17 1017    Visit Number  1    Number of Visits  12    Date for PT Re-Evaluation  06/04/17    PT Start Time  4315 late     PT Stop Time  1017    PT Time Calculation (min)  40 min    Activity Tolerance  Patient tolerated treatment well;No increased pain    Behavior During Therapy  WFL for tasks assessed/performed       Past Medical History:  Diagnosis Date  . Abdominal pain, unspecified site   . Abnormality of gait   . Apnea   . Cardiomegaly   . Chest pain, unspecified   . Complication of anesthesia    hard time waking up  . Depressive disorder, not elsewhere classified   . Diabetes mellitus   . Dizziness and giddiness   . Dyskinesia of esophagus   . Edema   . Extrinsic asthma, unspecified   . Female stress incontinence   . Gout, unspecified   . High cholesterol   . Hypertension   . Lipoma of other skin and subcutaneous tissue   . Lumbago   . Memory loss   . Migraine without aura, with intractable migraine, so stated, with status migrainosus   . Mild cognitive impairment, so stated   . Nonspecific (abnormal) findings on radiological and other examination of abdominal area, including retroperitoneum   . Nonspecific abnormal results of liver function study   . Obesity, unspecified   . Obstructive chronic bronchitis with exacerbation ([place])   . Other and unspecified hyperlipidemia   . Other B-complex deficiencies   . Other nonspecific abnormal serum enzyme levels   . Other specified cardiac dysrhythmias(427.89)   . Other specified disease of sebaceous glands   . Other symptoms involving cardiovascular system   . Pain in  joint, ankle and foot   . Pain in joint, pelvic region and thigh   . Pain in joint, shoulder region   . Palpitations   . Reflux esophagitis   . Shortness of breath   . Tension headache   . Type I (juvenile type) diabetes mellitus without mention of complication, not stated as uncontrolled   . Type I (juvenile type) diabetes mellitus without mention of complication, uncontrolled   . Unspecified essential hypertension   . Unspecified hypothyroidism   . Unspecified vitamin D deficiency   . Ventral hernia, unspecified, without mention of obstruction or gangrene     Past Surgical History:  Procedure Laterality Date  . ABDOMINAL HYSTERECTOMY    . APPENDECTOMY    . BREAST SURGERY    . EXPLORATORY LAPAROTOMY W/ BOWEL RESECTION  07/25/2004   PROCEDURE: Laparoscopy, open laparotomy, resection of jejunojejunostomy  . INCISIONAL HERNIA REPAIR  03/19/2006   PROCEDURE: Open ventral hernia repair with mesh.  Marland Kitchen LAPAROSCOPIC GASTRIC BYPASS  07/22/2004   PROCEDURE: Laparoscopic Roux-en-Y gastric bypass, antecolic, antegastric,  . LAPAROSCOPIC INCISIONAL / UMBILICAL / VENTRAL HERNIA REPAIR  09/22/2005   PROCEDURE: Laparoscopic ventral hernia repair with mesh.  Marland Kitchen LEFT HEART CATH AND CORONARY ANGIOGRAPHY N/A 11/20/2016   Procedure: Left Heart Cath and Coronary Angiography;  Surgeon: Sherren Mocha, MD;  Location: [facility];  Service: Cardiovascular;  Laterality: N/A;  . LEFT HEART CATHETERIZATION WITH CORONARY ANGIOGRAM N/A 08/21/2014   Procedure: LEFT HEART CATHETERIZATION WITH CORONARY ANGIOGRAM;  Surgeon: Sherren Mocha, MD;  Location: [facility] CATH [facility];  Service: Cardiovascular;  Laterality: N/A;  . stents     last time she said was 1 yr ago     There were no vitals filed for this visit.   Subjective Assessment - 04/27/17 0939    Subjective  She reports limits with decr walking and SOB with LBP and hip pain.  She has had injections in back last ones a year ago but with diabetes issues with  increased levels    Pertinent History  Cardiac issues , hernia ( 2 surgeries with failure x2)    Limitations  Walking;Standing    How long can you sit comfortably?  As needed. Eliminates pain    How long can you stand comfortably?  3 min, back pain and knees give out.     How long can you walk comfortably?  100-150 feet    Diagnostic tests  Xray in past.  Degenerating spine.     Patient Stated Goals  walk more , stand longer.      Currently in Pain?  Yes    Pain Score  9  when on feet     Pain Location  Back and hips    Pain Orientation  Left;Right;Posterior    Pain Descriptors / Indicators  -- just pain    Pain Type  Chronic pain    Pain Onset  More than a month ago    Pain Frequency  Intermittent    Aggravating Factors   standing and walking    Pain Relieving Factors  sitting    Multiple Pain Sites  No         [facility] PT Assessment - 04/27/17 0001      Assessment   Medical Diagnosis  Chronic LBP    Referring Provider  Sherrie Mustache, NP    Onset Date/Surgical Date  -- Back pain for many years    Next MD Visit  As needed    Prior Therapy  No      Precautions   Precautions  None      Restrictions   Weight Bearing Restrictions  No      Balance Screen   Has the patient fallen in the past 6 months  Yes    How many times?  1 slipped on floorfrom chair when leaning    Has the patient had a decrease in activity level because of a fear of falling?   Yes    Is the patient reluctant to leave their home because of a fear of falling?   No      Home Environment   Living Environment  Private residence    Living Arrangements  Alone    Type of [place] entrance    [place]  One level      Prior Function   Level of Independence  Independent      Cognition   Overall Cognitive Status  Within Functional Limits for tasks assessed      Observation/Other Assessments   Focus on Therapeutic Outcomes (FOTO)   63% limited      ROM / Strength   AROM /  PROM / Strength  AROM;Strength;PROM      AROM   AROM Assessment Site  Lumbar  Lumbar Flexion  40    Lumbar Extension  10    Lumbar - Right Side Bend  10    Lumbar - Left Side Bend  5      PROM   Overall PROM Comments  LE ROM WFL bilateral except hamstring ROM      Strength   Overall Strength Comments  thighs and ankles 4+/5 to 5/5  hips 4-/5 LT 4/5 RT       Flexibility   Soft Tissue Assessment /Muscle Length  yes    Hamstrings  60 degrees bilateral SLR      Palpation   Palpation comment  Tender bilateral hips and ITBand, pain       Bed Mobility   Bed Mobility  -- needs assist to rise from mat      Ambulation/Gait   Gait Comments  No device , mildly unsteady , more so with standing static             Objective measurements completed on examination: See above findings.              PT Education - 04/27/17 1116    Education provided  Yes    Education Details  POC, issued handout on DN    Person(s) Educated  Patient    Methods  Explanation    Comprehension  Verbalized understanding       PT Short Term Goals - 04/27/17 1014      PT SHORT TERM GOAL #1   Title  She will be indpeendent with initial HEP    Time  3      PT SHORT TERM GOAL #2   Title  She will report able to stand for 5 min or more with less pain    Time  3    Period  Weeks    Status  New      PT SHORT TERM GOAL #3   Title  She will report greater ease walkiing ramp to enter home    Time  3    Period  Weeks    Status  New        PT Long Term Goals - 04/27/17 1015      PT LONG TERM GOAL #1   Title  She will b e independent with all HEP issued    Time  6    Period  Weeks    Status  New      PT LONG TERM GOAL #2   Title  She will report pain with standing decr 50% and able to stand with support 10 min    Time  6    Period  Weeks    Status  New      PT LONG TERM GOAL #3   Title  She will be able to walk 300 feet before incr pain with device.     Time  6    Period   Weeks    Status  New      PT LONG TERM GOAL #4   Title  FOTO score improved  to < 40% limited    Time  6    Period  Weeks    Status  New             Plan - 04/27/17 1142    Clinical Impression Statement  Ms Sutherlin presents with chronic LBP and bilateral hip pain better with sitting and worse with activity on feet. She is limitied in spine ROM  and is very weak in core muscles and a large hernia does not help abdominal strength. Tender ness at the lateral hips and soft tissue and ITBand. LT > RT.  Her scans indicate significant degenerative changes so she may have limited progress but has not had PT in past .       History and Personal Factors relevant to plan of care:  Obesity , hernia, significant degenerative changes in lower spine. decreased balance and  CAD    Clinical Presentation  Stable    Clinical Presentation due to:  chronic pain syndrome    Clinical Decision Making  Low    Rehab Potential  Good    Clinical Impairments Affecting Rehab Potential  chronicity of condition and multiple medical issues    PT Frequency  2x / week    PT Duration  3 weeks    PT Treatment/Interventions  Moist Heat;Electrical Stimulation;Therapeutic exercise;Manual techniques;Patient/family education;Passive range of motion;Dry needling    PT Next Visit Plan  BERG Balance test, STW/manual, basic core strength , modalities    Consulted and Agree with Plan of Care  Patient       Patient will benefit from skilled therapeutic intervention in order to improve the following deficits and impairments:  Pain, Increased muscle spasms, Obesity, Difficulty walking, Decreased balance, Decreased activity tolerance, Decreased endurance, Decreased range of motion, Decreased strength, Postural dysfunction  Visit Diagnosis: Chronic bilateral low back pain without sciatica  Pain in left hip  Pain in right hip  Abnormal posture  Difficulty in walking, not elsewhere classified  Muscle spasm of back  G-Codes -  2017-05-13 1151    Functional Assessment Tool Used (Outpatient Only)  FOTO 63% limited    Functional Limitation  Mobility: Walking and moving around    Mobility: Walking and Moving Around Current Status [phone])  At least 60 percent but less than 80 percent impaired, limited or restricted    Mobility: Walking and Moving Around Goal Status [phone])  At least 20 percent but less than 40 percent impaired, limited or restricted        Problem List Patient Active Problem List   Diagnosis Date Noted  . Acute respiratory infection 06/23/2016  . Marital dysfunction 01/22/2016  . CAD (coronary artery disease), native coronary artery 08/22/2014  . Takotsubo syndrome 08/22/2014  . Obesity (BMI 30-39.9)   . Recurrent ventral incisional hernia 08/20/2014  . Elevated troponin   . Chronic pain syndrome 08/22/2013  . Chest pain at rest 04/17/2013  . Essential hypertension, benign 04/17/2013  . DM2 (diabetes mellitus, type 2) ([place]) 01/27/2013  . HLD (hyperlipidemia) 01/27/2013  . Lumbago   . DOE (dyspnea on exertion)   . Hypothyroidism   . Vitamin B12 deficiency 12/18/2009  . Depression 12/18/2009  . Vitamin D deficiency disease 02/13/2009  . Esophageal spasm 02/13/2009  . Pulmonary nodule 06/18/2008  . Esophageal reflux 04/12/2007  . Carotid bruit 04/21/2006  . Extrinsic asthma 08/25/2005  . Gout 11/12/2004    Darrel Hoover  PT 2017/05/13, 11:55 AM  [facility] [address] [place], [place], [address] Phone: [phone]   Fax:  [phone]  Name: [name] MRN: [id number] Date of Birth: 1940-06-12

## 2017-05-03 ENCOUNTER — Encounter: Payer: Self-pay | Admitting: Physical Therapy

## 2017-05-03 ENCOUNTER — Ambulatory Visit: Payer: Medicare Other | Attending: Nurse Practitioner | Admitting: Physical Therapy

## 2017-05-03 DIAGNOSIS — R262 Difficulty in walking, not elsewhere classified: Secondary | ICD-10-CM | POA: Insufficient documentation

## 2017-05-03 DIAGNOSIS — G8929 Other chronic pain: Secondary | ICD-10-CM | POA: Insufficient documentation

## 2017-05-03 DIAGNOSIS — M25551 Pain in right hip: Secondary | ICD-10-CM | POA: Diagnosis not present

## 2017-05-03 DIAGNOSIS — M62830 Muscle spasm of back: Secondary | ICD-10-CM | POA: Insufficient documentation

## 2017-05-03 DIAGNOSIS — M545 Low back pain: Secondary | ICD-10-CM | POA: Diagnosis not present

## 2017-05-03 DIAGNOSIS — M25552 Pain in left hip: Secondary | ICD-10-CM | POA: Insufficient documentation

## 2017-05-03 DIAGNOSIS — R293 Abnormal posture: Secondary | ICD-10-CM

## 2017-05-03 NOTE — Therapy (Signed)
[place], [place], [address] Phone: [phone]   Fax:  [phone]  Physical Therapy Treatment  Patient Details  Name: [name] MRN: [id number] Date of Birth: Jul 31, 1940 Referring Provider: Sherrie Mustache, NP   Encounter Date: 05/03/2017  PT End of Session - 05/03/17 1010    Visit Number  2    Number of Visits  12    Date for PT Re-Evaluation  06/04/17    PT Start Time  1008    PT Stop Time  1110    PT Time Calculation (min)  62 min       Past Medical History:  Diagnosis Date  . Abdominal pain, unspecified site   . Abnormality of gait   . Apnea   . Cardiomegaly   . Chest pain, unspecified   . Complication of anesthesia    hard time waking up  . Depressive disorder, not elsewhere classified   . Diabetes mellitus   . Dizziness and giddiness   . Dyskinesia of esophagus   . Edema   . Extrinsic asthma, unspecified   . Female stress incontinence   . Gout, unspecified   . High cholesterol   . Hypertension   . Lipoma of other skin and subcutaneous tissue   . Lumbago   . Memory loss   . Migraine without aura, with intractable migraine, so stated, with status migrainosus   . Mild cognitive impairment, so stated   . Nonspecific (abnormal) findings on radiological and other examination of abdominal area, including retroperitoneum   . Nonspecific abnormal results of liver function study   . Obesity, unspecified   . Obstructive chronic bronchitis with exacerbation ([place])   . Other and unspecified hyperlipidemia   . Other B-complex deficiencies   . Other nonspecific abnormal serum enzyme levels   . Other specified cardiac dysrhythmias(427.89)   . Other specified disease of sebaceous glands   . Other symptoms involving cardiovascular system   . Pain in joint, ankle and foot   . Pain in joint, pelvic region and thigh   . Pain in joint, shoulder region   . Palpitations   . Reflux esophagitis    . Shortness of breath   . Tension headache   . Type I (juvenile type) diabetes mellitus without mention of complication, not stated as uncontrolled   . Type I (juvenile type) diabetes mellitus without mention of complication, uncontrolled   . Unspecified essential hypertension   . Unspecified hypothyroidism   . Unspecified vitamin D deficiency   . Ventral hernia, unspecified, without mention of obstruction or gangrene     Past Surgical History:  Procedure Laterality Date  . ABDOMINAL HYSTERECTOMY    . APPENDECTOMY    . BREAST SURGERY    . EXPLORATORY LAPAROTOMY W/ BOWEL RESECTION  07/25/2004   PROCEDURE: Laparoscopy, open laparotomy, resection of jejunojejunostomy  . INCISIONAL HERNIA REPAIR  03/19/2006   PROCEDURE: Open ventral hernia repair with mesh.  Marland Kitchen LAPAROSCOPIC GASTRIC BYPASS  07/22/2004   PROCEDURE: Laparoscopic Roux-en-Y gastric bypass, antecolic, antegastric,  . LAPAROSCOPIC INCISIONAL / UMBILICAL / VENTRAL HERNIA REPAIR  09/22/2005   PROCEDURE: Laparoscopic ventral hernia repair with mesh.  Marland Kitchen LEFT HEART CATH AND CORONARY ANGIOGRAPHY N/A 11/20/2016   Procedure: Left Heart Cath and Coronary Angiography;  Surgeon: Sherren Mocha, MD;  Location: [facility];  Service: Cardiovascular;  Laterality: N/A;  . LEFT HEART CATHETERIZATION WITH CORONARY ANGIOGRAM N/A 08/21/2014   Procedure: LEFT HEART CATHETERIZATION WITH CORONARY  [facility];  Surgeon: Sherren Mocha, MD;  Location: [facility] CATH LAB;  Service: Cardiovascular;  Laterality: N/A;  . stents     last time she said was 1 yr ago     There were no vitals filed for this visit.  Subjective Assessment - 05/03/17 1008    Subjective  This morning, worst pain is left hip 8/10, back 6-7/10    Currently in Pain?  Yes    Pain Score  7     Pain Location  Back    Pain Orientation  Left;Right;Posterior    Pain Descriptors / Indicators  -- pain         [facility] PT Assessment - 05/03/17 0001      Standardized Balance  Assessment   Standardized Balance Assessment  Berg Balance Test      Berg Balance Test   Sit to Stand  Able to stand  independently using hands    Standing Unsupported  Able to stand safely 2 minutes    Sitting with Back Unsupported but Feet Supported on Floor or Stool  Able to sit safely and securely 2 minutes    Stand to Sit  Controls descent by using hands    Transfers  Able to transfer safely, definite need of hands    Standing Unsupported with Eyes Closed  Able to stand 3 seconds    Standing Ubsupported with Feet Together  Able to place feet together independently and stand for 1 minute with supervision    From Standing, Reach Forward with Outstretched Arm  Can reach forward >5 cm safely (2")    From Standing Position, Pick up Object from [place] to pick up shoe, needs supervision    From Standing Position, Turn to Look Behind Over each Shoulder  Looks behind from both sides and weight shifts well    Turn 360 Degrees  Able to turn 360 degrees safely but slowly    Standing Unsupported, Alternately Place Feet on Step/Stool  Able to complete >2 steps/needs minimal assist Min HHA     Standing Unsupported, One Foot in Front  Able to plae foot ahead of the other independently and hold 30 seconds knees began to buckle after 30 seconds     Standing on One Leg  Unable to try or needs assist to prevent fall    Total Score  37                  [facility] Adult PT Treatment/Exercise - 05/03/17 0001      Exercises   Exercises  Knee/Hip      Lumbar Exercises: Stretches   Lower Trunk Rotation  5 reps      Lumbar Exercises: Supine   Bridge  10 reps      Knee/Hip Exercises: Seated   Ball Squeeze  x 10    Clamshell with TheraBand  Red x 10    Marching  Both;1 set;10 reps;Other (comment) theraband, red      Modalities   Modalities  Moist Heat      Moist Heat Therapy   Number Minutes Moist Heat  15 Minutes    Moist Heat Location  Lumbar Spine and hip, seated                 PT Short Term Goals - 04/27/17 1014      PT SHORT TERM GOAL #1   Title  She will be indpeendent with initial HEP    Time  3  PT SHORT TERM GOAL #2   Title  She will report able to stand for 5 min or more with less pain    Time  3    Period  Weeks    Status  New      PT SHORT TERM GOAL #3   Title  She will report greater ease walkiing ramp to enter home    Time  3    Period  Weeks    Status  New        PT Long Term Goals - 04/27/17 1015      PT LONG TERM GOAL #1   Title  She will b e independent with all HEP issued    Time  6    Period  Weeks    Status  New      PT LONG TERM GOAL #2   Title  She will report pain with standing decr 50% and able to stand with support 10 min    Time  6    Period  Weeks    Status  New      PT LONG TERM GOAL #3   Title  She will be able to walk 300 feet before incr pain with device.     Time  6    Period  Weeks    Status  New      PT LONG TERM GOAL #4   Title  FOTO score improved  to < 40% limited    Time  6    Period  Weeks    Status  New            Plan - 05/03/17 1043    Clinical Impression Statement  37/56 BERG. Pt verbalizes desire for RW. Faxed script to MD for signature. She does not tolerate lying supine/ hooklying due to back pain, prefers sititng. Began gentle hip strengthening with cues for abdominal draw in. Pt has large ventral hernia. HMP at end of session to decrease pain.     PT Next Visit Plan  check for RW order and issue STW/manual, basic core strength , modalities; TPDN next back/ left hip     Consulted and Agree with Plan of Care  Patient       Patient will benefit from skilled therapeutic intervention in order to improve the following deficits and impairments:  Pain, Increased muscle spasms, Obesity, Difficulty walking, Decreased balance, Decreased activity tolerance, Decreased endurance, Decreased range of motion, Decreased strength, Postural dysfunction  Visit  Diagnosis: Chronic bilateral low back pain without sciatica  Pain in left hip  Pain in right hip  Abnormal posture  Difficulty in walking, not elsewhere classified  Muscle spasm of back     Problem List Patient Active Problem List   Diagnosis Date Noted  . Acute respiratory infection 06/23/2016  . Marital dysfunction 01/22/2016  . CAD (coronary artery disease), native coronary artery 08/22/2014  . Takotsubo syndrome 08/22/2014  . Obesity (BMI 30-39.9)   . Recurrent ventral incisional hernia 08/20/2014  . Elevated troponin   . Chronic pain syndrome 08/22/2013  . Chest pain at rest 04/17/2013  . Essential hypertension, benign 04/17/2013  . DM2 (diabetes mellitus, type 2) ([place]) 01/27/2013  . HLD (hyperlipidemia) 01/27/2013  . Lumbago   . DOE (dyspnea on exertion)   . Hypothyroidism   . Vitamin B12 deficiency 12/18/2009  . Depression 12/18/2009  . Vitamin D deficiency disease 02/13/2009  . Esophageal spasm 02/13/2009  . Pulmonary nodule 06/18/2008  . Esophageal  reflux 04/12/2007  . Carotid bruit 04/21/2006  . Extrinsic asthma 08/25/2005  . Gout 11/12/2004    Dorene Ar, PTA 05/03/2017, 11:22 AM  [facility] [address] [place], [place], [address] Phone: [phone]   Fax:  [phone]  Name: PAULITA LICKLIDER MRN: [id number] Date of Birth: 07-04-1940

## 2017-05-06 ENCOUNTER — Ambulatory Visit: Payer: Medicare Other | Admitting: Physical Therapy

## 2017-05-13 ENCOUNTER — Encounter: Payer: Self-pay | Admitting: Physical Therapy

## 2017-05-17 ENCOUNTER — Ambulatory Visit: Payer: Medicare Other

## 2017-05-20 ENCOUNTER — Ambulatory Visit: Payer: Medicare Other | Admitting: Physical Therapy

## 2017-05-20 ENCOUNTER — Encounter: Payer: Self-pay | Admitting: Physical Therapy

## 2017-05-20 DIAGNOSIS — R262 Difficulty in walking, not elsewhere classified: Secondary | ICD-10-CM | POA: Diagnosis not present

## 2017-05-20 DIAGNOSIS — M545 Low back pain, unspecified: Secondary | ICD-10-CM

## 2017-05-20 DIAGNOSIS — R293 Abnormal posture: Secondary | ICD-10-CM | POA: Diagnosis not present

## 2017-05-20 DIAGNOSIS — G8929 Other chronic pain: Secondary | ICD-10-CM | POA: Diagnosis not present

## 2017-05-20 DIAGNOSIS — M25552 Pain in left hip: Secondary | ICD-10-CM | POA: Diagnosis not present

## 2017-05-20 DIAGNOSIS — M62830 Muscle spasm of back: Secondary | ICD-10-CM

## 2017-05-20 DIAGNOSIS — M25551 Pain in right hip: Secondary | ICD-10-CM | POA: Diagnosis not present

## 2017-05-20 NOTE — Therapy (Addendum)
[place], [place], [address] Phone: [phone]   Fax:  [phone]  Physical Therapy Treatment/Discharge Note  Patient Details  Name: [name] MRN: [id number] Date of Birth: Jul 19, 1940 Referring Provider: Sherrie Mustache, NP   Encounter Date: 05/20/2017  PT End of Session - 05/20/17 1034    Visit Number  3    Number of Visits  12    Date for PT Re-Evaluation  06/04/17    PT Start Time  1034 Pt was in lobby at 10:02 and was not clocked in at front desk. PT called pt about NS and reported she was here    PT Stop Time  1110    PT Time Calculation (min)  36 min    Activity Tolerance  Patient tolerated treatment well;No increased pain    Behavior During Therapy  WFL for tasks assessed/performed       Past Medical History:  Diagnosis Date  . Abdominal pain, unspecified site   . Abnormality of gait   . Apnea   . Cardiomegaly   . Chest pain, unspecified   . Complication of anesthesia    hard time waking up  . Depressive disorder, not elsewhere classified   . Diabetes mellitus   . Dizziness and giddiness   . Dyskinesia of esophagus   . Edema   . Extrinsic asthma, unspecified   . Female stress incontinence   . Gout, unspecified   . High cholesterol   . Hypertension   . Lipoma of other skin and subcutaneous tissue   . Lumbago   . Memory loss   . Migraine without aura, with intractable migraine, so stated, with status migrainosus   . Mild cognitive impairment, so stated   . Nonspecific (abnormal) findings on radiological and other examination of abdominal area, including retroperitoneum   . Nonspecific abnormal results of liver function study   . Obesity, unspecified   . Obstructive chronic bronchitis with exacerbation ([place])   . Other and unspecified hyperlipidemia   . Other B-complex deficiencies   . Other nonspecific abnormal serum enzyme levels   . Other specified cardiac  dysrhythmias(427.89)   . Other specified disease of sebaceous glands   . Other symptoms involving cardiovascular system   . Pain in joint, ankle and foot   . Pain in joint, pelvic region and thigh   . Pain in joint, shoulder region   . Palpitations   . Reflux esophagitis   . Shortness of breath   . Tension headache   . Type I (juvenile type) diabetes mellitus without mention of complication, not stated as uncontrolled   . Type I (juvenile type) diabetes mellitus without mention of complication, uncontrolled   . Unspecified essential hypertension   . Unspecified hypothyroidism   . Unspecified vitamin D deficiency   . Ventral hernia, unspecified, without mention of obstruction or gangrene     Past Surgical History:  Procedure Laterality Date  . ABDOMINAL HYSTERECTOMY    . APPENDECTOMY    . BREAST SURGERY    . EXPLORATORY LAPAROTOMY W/ BOWEL RESECTION  07/25/2004   PROCEDURE: Laparoscopy, open laparotomy, resection of jejunojejunostomy  . INCISIONAL HERNIA REPAIR  03/19/2006   PROCEDURE: Open ventral hernia repair with mesh.  Marland Kitchen LAPAROSCOPIC GASTRIC BYPASS  07/22/2004   PROCEDURE: Laparoscopic Roux-en-Y gastric bypass, antecolic, antegastric,  . LAPAROSCOPIC INCISIONAL / UMBILICAL / VENTRAL HERNIA REPAIR  09/22/2005   PROCEDURE: Laparoscopic ventral hernia repair with mesh.  Marland Kitchen LEFT HEART CATH AND  CORONARY ANGIOGRAPHY N/A 11/20/2016   Procedure: Left Heart Cath and Coronary Angiography;  Surgeon: Sherren Mocha, MD;  Location: [facility];  Service: Cardiovascular;  Laterality: N/A;  . LEFT HEART CATHETERIZATION WITH CORONARY ANGIOGRAM N/A 08/21/2014   Procedure: LEFT HEART CATHETERIZATION WITH CORONARY ANGIOGRAM;  Surgeon: Sherren Mocha, MD;  Location: [facility];  Service: Cardiovascular;  Laterality: N/A;  . stents     last time she said was 1 yr ago     There were no vitals filed for this visit.  Subjective Assessment - 05/20/17 1036    Subjective  this morning is  the worst pain left hip  8/10  , back 8/10  My husband is in [place] with congestive heart failure and I cant see him for [place] because I cant drive that far in pain.      Pertinent History  Cardiac issues , hernia ( 2 surgeries with failure x2)    Diagnostic tests  Xray in past.  Degenerating spine.     Patient Stated Goals  walk more , stand longer.      Currently in Pain?  Yes    Pain Score  8     Pain Location  Back    Pain Orientation  Left;Right;Posterior    Pain Type  Chronic pain    Pain Onset  More than a month ago                      [facility] Adult PT Treatment/Exercise - 05/20/17 1037      Self-Care   Self-Care  Other Self-Care Comments    Other Self-Care Comments   use of tennis ball for self myofascial release with deomonstration      Exercises   Exercises  Knee/Hip      Lumbar Exercises: Supine   Bridge  10 reps PT assist    Other Supine Lumbar Exercises  Decompression Exercises , resting position, shoulder press, head press, leg press and leg lengthener  for 15 minutes instruction      Knee/Hip Exercises: Seated   Other Seated Knee/Hip Exercises  seated abdominal isometric x 10      Modalities   Modalities  Moist Heat      Moist Heat Therapy   Number Minutes Moist Heat  15 Minutes    Moist Heat Location  Hip;Lumbar Spine left hip      Manual Therapy   Manual Therapy  Soft tissue mobilization    Soft tissue mobilization  gentle soft tissue to left hip               PT Short Term Goals - 05/20/17 1320      PT SHORT TERM GOAL #1   Title  She will be indpeendent with initial HEP    Baseline  given decompression exercises    Time  3    Period  Weeks    Status  On-going      PT SHORT TERM GOAL #2   Title  She will report able to stand for 5 min or more with less pain    Baseline  8/10 pain today    Time  3    Period  Weeks    Status  On-going      PT SHORT TERM GOAL #3   Title  She will report greater ease walkiing ramp to enter  home    Time  3    Period  Weeks  Status  Unable to assess        PT Long Term Goals - 04/27/17 1015      PT LONG TERM GOAL #1   Title  She will b e independent with all HEP issued    Time  6    Period  Weeks    Status  New      PT LONG TERM GOAL #2   Title  She will report pain with standing decr 50% and able to stand with support 10 min    Time  6    Period  Weeks    Status  New      PT LONG TERM GOAL #3   Title  She will be able to walk 300 feet before incr pain with device.     Time  6    Period  Weeks    Status  New      PT LONG TERM GOAL #4   Title  FOTO score improved  to < 40% limited    Time  6    Period  Weeks    Status  New            Plan - 05/20/17 1039    Clinical Impression Statement  Pt with large ventral hernia.  Pt with 8/10 pain in hips and back and was upset about husband with congestive heart failure in [place] and unable to go and see him due to her pain.  Pt recieved call during RX session.  Pt was given decompression exercises today and simple abdominal exercises in sitting.  HMP at end of seesion to decrease pain  Will continue with possible TPDN next session as pt is able to tolerate    Clinical Impairments Affecting Rehab Potential  chronicity of condition and multiple medical issues    PT Frequency  2x / week    PT Duration  3 weeks    PT Treatment/Interventions  Moist Heat;Electrical Stimulation;Therapeutic exercise;Manual techniques;Patient/family education;Passive range of motion;Dry needling    PT Next Visit Plan  check for RW order and issue STW/manual, basic core strength , modalities; TPDN next back/ left hip     Consulted and Agree with Plan of Care  Patient       Patient will benefit from skilled therapeutic intervention in order to improve the following deficits and impairments:  Pain, Increased muscle spasms, Obesity, Difficulty walking, Decreased balance, Decreased activity tolerance, Decreased endurance, Decreased range of  motion, Decreased strength, Postural dysfunction  Visit Diagnosis: Chronic bilateral low back pain without sciatica  Pain in left hip  Pain in right hip  Abnormal posture  Difficulty in walking, not elsewhere classified  Muscle spasm of back     Problem List Patient Active Problem List   Diagnosis Date Noted  . Acute respiratory infection 06/23/2016  . Marital dysfunction 01/22/2016  . CAD (coronary artery disease), native coronary artery 08/22/2014  . Takotsubo syndrome 08/22/2014  . Obesity (BMI 30-39.9)   . Recurrent ventral incisional hernia 08/20/2014  . Elevated troponin   . Chronic pain syndrome 08/22/2013  . Chest pain at rest 04/17/2013  . Essential hypertension, benign 04/17/2013  . DM2 (diabetes mellitus, type 2) ([place]) 01/27/2013  . HLD (hyperlipidemia) 01/27/2013  . Lumbago   . DOE (dyspnea on exertion)   . Hypothyroidism   . Vitamin B12 deficiency 12/18/2009  . Depression 12/18/2009  . Vitamin D deficiency disease 02/13/2009  . Esophageal spasm 02/13/2009  . Pulmonary nodule 06/18/2008  . Esophageal  reflux 04/12/2007  . Carotid bruit 04/21/2006  . Extrinsic asthma 08/25/2005  . Gout 11/12/2004   Voncille Lo, PT Certified Exercise Expert for the Aging Adult  05/20/17 1:23 PM Phone: [phone] Fax: [facility] [address] [place], [place], [address] Phone: [phone]   Fax:  [phone]  Name: JAKHIA BUXTON MRN: [id number] Date of Birth: [birth year]   PHYSICAL THERAPY DISCHARGE SUMMARY  Visits from Start of Care: 3  Current functional level related to goals / functional outcomes: Unknown not seen since last visit in clinic   Remaining deficits: Unknown   Education / Equipment: Initial HEP Plan:                                                    Patient goals were not met. Patient is being discharged due to not returning since the last visit.  ?????    Voncille Lo, PT Certified Exercise Expert for the Aging Adult  07/13/17 11:23 AM Phone: [phone] Fax: [phone]

## 2017-05-27 ENCOUNTER — Ambulatory Visit: Payer: Medicare Other | Admitting: Physical Therapy

## 2017-06-03 ENCOUNTER — Telehealth: Payer: Self-pay | Admitting: Nurse Practitioner

## 2017-06-03 ENCOUNTER — Ambulatory Visit: Payer: Medicare Other

## 2017-06-03 NOTE — Telephone Encounter (Signed)
°  Pt c/o medication issue:  1. Name of Medication: atorvastatin (LIPITOR) 10 MG tablet  2. How are you currently taking this medication (dosage and times per day)? Take 1 tablet (10 mg total) by mouth daily. In the evening.  3. Are you having a reaction (difficulty breathing--STAT)? no  4. What is your medication issue? Pt want another prescription written she said that it causes cancer

## 2017-06-03 NOTE — Telephone Encounter (Signed)
Pt states she heard on TV that  atorvastatin caused cancer and had been recalled. I confirmed with [facility] pharmacist that atorvastatin had not been recalled. Pt confirmed she is taking losartan/HCTZ 50 mg/12.5mg , pt advised that losartan /HCTZ 100mg /25mg  and she should contact her retail pharmacy to see if her supply had been affected by the recall. Pt aware that atorvastatin is a different medication from losartan/HCTZ or valsartan and has not been recalled. Pt verbalized understanding, agreed to call her pharmacy.

## 2017-06-14 ENCOUNTER — Other Ambulatory Visit: Payer: Self-pay

## 2017-06-17 ENCOUNTER — Ambulatory Visit: Payer: Medicare Other | Admitting: Nurse Practitioner

## 2017-07-26 ENCOUNTER — Other Ambulatory Visit: Payer: Self-pay | Admitting: *Deleted

## 2017-07-26 NOTE — Telephone Encounter (Signed)
High risk warning received. Interaction between Omeprazole and Citalopram. Pended and forwarded to [facility] for approval.

## 2017-07-28 ENCOUNTER — Other Ambulatory Visit: Payer: Self-pay | Admitting: Internal Medicine

## 2017-07-28 DIAGNOSIS — K2100 Gastro-esophageal reflux disease with esophagitis, without bleeding: Secondary | ICD-10-CM

## 2017-07-28 NOTE — Telephone Encounter (Signed)
Janett Billow you recently denied this medication with the reason change not appropriate.   RX was sent to you by Anita/CMA on 07/26/17 due to high risk warning that populated when attempting to approve; High risk warning was between citalopram and omeprazole which are both on current medication list   Can you clarify denial reason and give a follow-up course of action to follow denial

## 2017-07-28 NOTE — Telephone Encounter (Signed)
Left message on voicemail instructing patient to call to schedule appointment to follow-up on medications.

## 2017-07-28 NOTE — Telephone Encounter (Signed)
I sent a msg to anita that pt needs appt before refill can be given.

## 2017-08-01 ENCOUNTER — Other Ambulatory Visit: Payer: Self-pay | Admitting: Nurse Practitioner

## 2017-08-01 DIAGNOSIS — G8929 Other chronic pain: Secondary | ICD-10-CM

## 2017-08-01 DIAGNOSIS — M545 Low back pain: Principal | ICD-10-CM

## 2017-08-03 DIAGNOSIS — H43811 Vitreous degeneration, right eye: Secondary | ICD-10-CM | POA: Diagnosis not present

## 2017-08-03 DIAGNOSIS — E113391 Type 2 diabetes mellitus with moderate nonproliferative diabetic retinopathy without macular edema, right eye: Secondary | ICD-10-CM | POA: Diagnosis not present

## 2017-08-03 DIAGNOSIS — H35043 Retinal micro-aneurysms, unspecified, bilateral: Secondary | ICD-10-CM | POA: Diagnosis not present

## 2017-08-03 DIAGNOSIS — H35372 Puckering of macula, left eye: Secondary | ICD-10-CM | POA: Diagnosis not present

## 2017-08-06 ENCOUNTER — Telehealth: Payer: Self-pay

## 2017-08-06 ENCOUNTER — Other Ambulatory Visit: Payer: Medicare Other

## 2017-08-06 NOTE — Telephone Encounter (Signed)
Patient was on the lab schedule this morning and called to cancel appointment. Patient stated her blood sugar dropped low and she was in a parking lot (had no idea where) and some lady was helping help. Patient has a rushed tone and said she would call back later to reschedule and hung up.  I cancelled lab appointment. Message sent to PCP as a [place]

## 2017-08-06 NOTE — Telephone Encounter (Signed)
Noted; please follow up with her if you do not hear back

## 2017-08-06 NOTE — Telephone Encounter (Signed)
Spoke with patient, patient states she was in a parking lot somewhere near the office. Patient became disoriented, had vision changes and felt faint. Patient seen a lady and signaled for her to come help.  The lady assisted patient and called EMS.   EMS evaluated patient x 20 min, patient was given crackers, juice and blood sugar was 78. Patient drove home against advice of EMS.   Patient currently feels fine. Patient states she did not eat last night and took her insulin and that onset this episode   Patient will keep pending appointment Monday and reschedule labs when she comes in on Monday

## 2017-08-07 DIAGNOSIS — M25572 Pain in left ankle and joints of left foot: Secondary | ICD-10-CM | POA: Diagnosis not present

## 2017-08-09 ENCOUNTER — Encounter: Payer: Self-pay | Admitting: Nurse Practitioner

## 2017-08-09 ENCOUNTER — Ambulatory Visit (INDEPENDENT_AMBULATORY_CARE_PROVIDER_SITE_OTHER): Payer: Medicare Other | Admitting: Nurse Practitioner

## 2017-08-09 VITALS — BP 136/80 | HR 80 | Temp 98.5°F | Ht 62.0 in | Wt 204.0 lb

## 2017-08-09 DIAGNOSIS — E113513 Type 2 diabetes mellitus with proliferative diabetic retinopathy with macular edema, bilateral: Secondary | ICD-10-CM

## 2017-08-09 DIAGNOSIS — M19171 Post-traumatic osteoarthritis, right ankle and foot: Secondary | ICD-10-CM | POA: Diagnosis not present

## 2017-08-09 DIAGNOSIS — Z794 Long term (current) use of insulin: Secondary | ICD-10-CM

## 2017-08-09 DIAGNOSIS — K219 Gastro-esophageal reflux disease without esophagitis: Secondary | ICD-10-CM | POA: Diagnosis not present

## 2017-08-09 DIAGNOSIS — I10 Essential (primary) hypertension: Secondary | ICD-10-CM | POA: Diagnosis not present

## 2017-08-09 DIAGNOSIS — E039 Hypothyroidism, unspecified: Secondary | ICD-10-CM

## 2017-08-09 DIAGNOSIS — D509 Iron deficiency anemia, unspecified: Secondary | ICD-10-CM | POA: Diagnosis not present

## 2017-08-09 DIAGNOSIS — F332 Major depressive disorder, recurrent severe without psychotic features: Secondary | ICD-10-CM

## 2017-08-09 DIAGNOSIS — K5900 Constipation, unspecified: Secondary | ICD-10-CM | POA: Diagnosis not present

## 2017-08-09 DIAGNOSIS — I25119 Atherosclerotic heart disease of native coronary artery with unspecified angina pectoris: Secondary | ICD-10-CM

## 2017-08-09 MED ORDER — OMEPRAZOLE 20 MG PO CPDR: 20.0000 mg | DELAYED_RELEASE_CAPSULE | Freq: Every day | ORAL | 1 refills | Status: DC

## 2017-08-09 NOTE — Progress Notes (Signed)
Careteam: Patient Care Team: Lauree Chandler, NP as PCP - General (Geriatric Medicine) Excell Seltzer, MD as Consulting Physician (General Surgery) Tanda Rockers, MD as Consulting Physician (Pulmonary Disease) Ronald Lobo, MD as Consulting Physician (Gastroenterology) Jacolyn Reedy, MD as Consulting Physician (Cardiology)  Advanced Directive information    Allergies  Allergen Reactions  . Ciprofloxacin Rash  . Advair Diskus [Fluticasone-Salmeterol] Other (See Comments)    Throat closes  . Albuterol     REACTION: closes throat  . Alendronate Sodium Itching  . Benadryl [Diphenhydramine Hcl] Itching  . Benzonatate     REACTION: rash/hives  . Cephalexin Nausea Only  . Gabapentin Other (See Comments)    Loss of memory  . Iohexol      Desc: rash and DIF BREATHING   . Keflex [Cephalexin] Nausea And Vomiting  . Morphine Nausea And Vomiting  . Other Other (See Comments)    Decongestants- keeps pt awake at night, increased heart rate  . Propoxyphene Hcl     Doesn't recall  . Reclast [Zoledronic Acid] Other (See Comments)    Chest pain  . Avapro [Irbesartan] Rash  . Codeine Nausea And Vomiting, Swelling and Rash  . Tessalon Perles Rash  . Tramadol Nausea Only and Rash    Chief Complaint  Patient presents with  . Medical Management of Chronic Issues    5 month follow-up, patient had to cancel several appointment's due to passing of husband.   . Medication Refill    Omeprazole #90   . Blood Sugar Problem    Patient states " I had a near death episode on 09-22-2022 due to dropped blood sugar."       HPI: Patient is a 77 y.o. female seen in the office today for routine follow up  Pt with diabetes and last week was scheduled for lab the next morning, did not eat much the night before and then fasted that morning. Started feeling woozy so pulled over and had to call EMS and they stayed with her until her blood sugar came up.   Had a bad fall (2 month ago)  and has been seeing orthopedic due to pain afterwards which was progressive- Orthopedic gave her prednisone shot (2 days ago) and then her blood sugar was sky high.   Reports her husband passed on 2023-06-15 and she had to cancel all her appts because she was not sure what to do.   Depression- feels like mood has been bad since husband death. Increase stress. Talked to church therapist.   GERD- controlled on omeprazole.   CAD/HTN- reports she has been having palpitations, feels like it is related to her nerves. Using nitro PRN- not recently. Also thinks that GERD gives her similar symptoms due to her hernia.   Review of Systems:  Review of Systems  Constitutional: Negative for chills, fever and weight loss.  HENT: Negative for congestion, ear pain, hearing loss, sinus pain and sore throat.   Eyes: Negative for blurred vision and pain.  Respiratory: Negative for cough and shortness of breath.   Cardiovascular: Negative for chest pain, palpitations and leg swelling.  Gastrointestinal: Negative for abdominal pain, constipation, diarrhea, heartburn and nausea.  Genitourinary: Negative for dysuria and frequency.  Musculoskeletal: Positive for back pain (lower back) and joint pain (bilateral hip). Negative for myalgias.       Pain unchanged, plans to start back with PT  Neurological: Negative for dizziness and headaches.  Psychiatric/Behavioral: Positive for depression. Negative for suicidal  ideas. The patient is nervous/anxious.        Worse since husband has passed    Past Medical History:  Diagnosis Date  . Abdominal pain, unspecified site   . Abnormality of gait   . Apnea   . Cardiomegaly   . Chest pain, unspecified   . Complication of anesthesia    hard time waking up  . Depressive disorder, not elsewhere classified   . Diabetes mellitus   . Dizziness and giddiness   . Dyskinesia of esophagus   . Edema   . Extrinsic asthma, unspecified   . Female stress incontinence   . Gout,  unspecified   . High cholesterol   . Hypertension   . Lipoma of other skin and subcutaneous tissue   . Lumbago   . Memory loss   . Migraine without aura, with intractable migraine, so stated, with status migrainosus   . Mild cognitive impairment, so stated   . Nonspecific (abnormal) findings on radiological and other examination of abdominal area, including retroperitoneum   . Nonspecific abnormal results of liver function study   . Obesity, unspecified   . Obstructive chronic bronchitis with exacerbation ([place])   . Other and unspecified hyperlipidemia   . Other B-complex deficiencies   . Other nonspecific abnormal serum enzyme levels   . Other specified cardiac dysrhythmias(427.89)   . Other specified disease of sebaceous glands   . Other symptoms involving cardiovascular system   . Pain in joint, ankle and foot   . Pain in joint, pelvic region and thigh   . Pain in joint, shoulder region   . Palpitations   . Reflux esophagitis   . Shortness of breath   . Tension headache   . Type I (juvenile type) diabetes mellitus without mention of complication, not stated as uncontrolled   . Type I (juvenile type) diabetes mellitus without mention of complication, uncontrolled   . Unspecified essential hypertension   . Unspecified hypothyroidism   . Unspecified vitamin D deficiency   . Ventral hernia, unspecified, without mention of obstruction or gangrene    Past Surgical History:  Procedure Laterality Date  . ABDOMINAL HYSTERECTOMY    . APPENDECTOMY    . BREAST SURGERY    . EXPLORATORY LAPAROTOMY W/ BOWEL RESECTION  07/25/2004   PROCEDURE: Laparoscopy, open laparotomy, resection of jejunojejunostomy  . INCISIONAL HERNIA REPAIR  03/19/2006   PROCEDURE: Open ventral hernia repair with mesh.  Marland Kitchen LAPAROSCOPIC GASTRIC BYPASS  07/22/2004   PROCEDURE: Laparoscopic Roux-en-Y gastric bypass, antecolic, antegastric,  . LAPAROSCOPIC INCISIONAL / UMBILICAL / VENTRAL HERNIA REPAIR  09/22/2005    PROCEDURE: Laparoscopic ventral hernia repair with mesh.  Marland Kitchen LEFT HEART CATH AND CORONARY ANGIOGRAPHY N/A 11/20/2016   Procedure: Left Heart Cath and Coronary Angiography;  Surgeon: Sherren Mocha, MD;  Location: [facility];  Service: Cardiovascular;  Laterality: N/A;  . LEFT HEART CATHETERIZATION WITH CORONARY ANGIOGRAM N/A 08/21/2014   Procedure: LEFT HEART CATHETERIZATION WITH CORONARY ANGIOGRAM;  Surgeon: Sherren Mocha, MD;  Location: [facility]ATH LAB;  Service: Cardiovascular;  Laterality: N/A;  . stents     last time she said was 1 yr ago    Social History:   reports that  has never smoked. she has never used smokeless tobacco. She reports that she does not drink alcohol or use drugs.  Family History  Problem Relation Age of Onset  . Stroke Mother   . Stroke Father   . Diabetes Father   . Heart disease Father   .  Diabetes Son   . Cancer Brother        BLADDER  . Diabetes Brother     Medications: Patient's Medications  New Prescriptions   No medications on file  Previous Medications   AMLODIPINE (NORVASC) 2.5 MG TABLET    TAKE 1 TABLET (2.5 MG TOTAL) BY MOUTH DAILY.   ASPIRIN 81 MG TABLET    Take 81 mg by mouth daily.    ATORVASTATIN (LIPITOR) 10 MG TABLET    Take 1 tablet (10 mg total) by mouth daily. In the evening.   CALCIUM CITRATE-VITAMIN D (CALCIUM CITRATE + D PO)    Take by mouth daily.   CETIRIZINE (ZYRTEC) 10 MG TABLET    Take 10 mg by mouth daily as needed for allergies. For allergies   CHOLECALCIFEROL (VITAMIN D3) 5000 UNITS CAPS    Take 1 tablet by mouth daily. For supplement   CINNAMON PO    Take 1,000 mg by mouth daily.   CITALOPRAM (CELEXA) 40 MG TABLET    TAKE 1 TABLET (40 MG TOTAL) BY MOUTH DAILY. 1 BY MOUTH DAILY FOR DEPRESSION   DOCUSATE SODIUM (COLACE) 100 MG CAPSULE    Take 1 capsule (100 mg total) by mouth 2 (two) times daily.   GLIPIZIDE (GLUCOTROL XL) 10 MG 24 HR TABLET    TAKE ONE TABLET BY MOUTH IN THE MORNING AND ONE TABLET IN THE EVENING TO  CONTROL BLOOD SUGAR   INSULIN NPH HUMAN (HUMULIN N) 100 UNIT/ML INJECTION    Take 35 units in the morning and 35 units in the evening.   ISOSORBIDE MONONITRATE (IMDUR) 30 MG 24 HR TABLET    Take 1 tablet (30 mg total) by mouth daily.   LOSARTAN-HYDROCHLOROTHIAZIDE (HYZAAR) 50-12.5 MG TABLET    TAKE ONE TABLET BY MOUTH ONCE DAILY TO CONTROL BLOOD PRESSURE   MAGNESIUM OXIDE 250 MG TABS    Take 500 mg by mouth daily.    NITROGLYCERIN (NITROSTAT) 0.4 MG SL TABLET    Place 1 tablet (0.4 mg total) under the tongue every 5 (five) minutes as needed. For chest pain. Maximum of 3 tablets in 15 minutes.   OMEPRAZOLE (PRILOSEC) 40 MG CAPSULE    Take 1 capsule by mouth daily.   PREDNISONE (DELTASONE) 5 MG TABLET    TAKE 1 TABLET BY MOUTH EVERY MORNING TO REDUCE PAIN   VITAMIN C (ASCORBIC ACID) 500 MG TABLET    Take 500 mg by mouth daily.  Modified Medications   No medications on file  Discontinued Medications   No medications on file     Physical Exam:  Vitals:   08/09/17 1451  BP: 136/80  Pulse: 80  Temp: 98.5 F (36.9 C)  TempSrc: Oral  SpO2: 99%  Weight: 204 lb (92.5 kg)  Height: 5\' 2"  (1.575 m)   Body mass index is 37.31 kg/m.  Physical Exam  Constitutional: She is oriented to person, place, and time. She appears well-developed and well-nourished. No distress.  HENT:  Head: Normocephalic and atraumatic.  Nose: Nose normal.  Mouth/Throat: Oropharynx is clear and moist.  Eyes: Conjunctivae and EOM are normal. Pupils are equal, round, and reactive to light. Right eye exhibits no discharge. Left eye exhibits no discharge.  Neck: Normal range of motion. Neck supple. No tracheal deviation present.  Cardiovascular: Normal rate, regular rhythm and normal heart sounds.  No murmur heard. Pulmonary/Chest: Effort normal and breath sounds normal. No respiratory distress.  Abdominal: Soft. Bowel sounds are normal. She exhibits no distension. There  is no tenderness.  Musculoskeletal: Normal range  of motion. She exhibits no edema, tenderness or deformity.  Neurological: She is alert and oriented to person, place, and time.  Skin: Skin is warm and dry. Capillary refill takes less than 2 seconds. She is not diaphoretic. No erythema.  Psychiatric: She has a normal mood and affect.    Labs reviewed: Basic Metabolic Panel: Recent Labs    11/04/16 0958 11/17/16 1054 03/17/17 0935  NA 141 139 139  K 3.8 3.9 4.4  CL 103 97 101  CO2 27 24 31   GLUCOSE 114* 129* 186*  BUN 21 24 16   CREATININE 0.93 1.10* 0.76  CALCIUM 8.6 9.4 9.2  TSH 3.68  --   --    Liver Function Tests: Recent Labs    03/17/17 0935  AST 11  ALT 12  BILITOT 0.5  PROT 6.2   No results for input(s): LIPASE, AMYLASE in the last 8760 hours. No results for input(s): AMMONIA in the last 8760 hours. CBC: Recent Labs    11/17/16 1054  WBC 10.2  HGB 11.4  HCT 36.4  MCV 82  PLT 381*   Lipid Panel: Recent Labs    11/04/16 0958 03/17/17 0935  CHOL 235* 209*  HDL 49* 47*  LDLCALC 133*  --   TRIG 267* 248*  CHOLHDL 4.8 4.4   TSH: Recent Labs    11/04/16 0958  TSH 3.68   A1C: Lab Results  Component Value Date   HGBA1C 7.9 (H) 03/17/2017     Assessment/Plan 1. Essential hypertension, benign -stable at this time, will continue current regimen.  - CBC with Differential/Platelets - COMPLETE METABOLIC PANEL WITH GFR  2. Type 2 diabetes mellitus with both eyes affected by proliferative retinopathy and macular edema, with long-term current use of insulin (HCC) -hypoglycemic episode due to fasting for labs, reports increase in blood sugar with recent steroid injection. Will follow up A1c at this time.  - Hemoglobin A1c - COMPLETE METABOLIC PANEL WITH GFR  3. Severe episode of recurrent major depressive disorder, without psychotic features ([place]) Ongoing, worse since husband has passed, discussed counseling center which pt seemed agreeable. Also discussed possible change in celexa to help manage  depression/anxiety.   4. Hypothyroidism, unspecified type TSH stable in June, will continue current regimen.   5. Coronary artery disease involving native coronary artery of native heart with angina pectoris (HCC) Stable, no recent episodes, feels like when she is experiencing chest pains it most likely is not cardiac but more anxiety or GERD related. Has follow up with cardiologist scheduled. Will continue current regimen.   6. Constipation, unspecified constipation type Controlled on current regimen.   7. Gastroesophageal reflux disease without esophagitis -will decrease omeprazole to 20 mg daily, discussed if symptoms are not controlled on this dose will need to change medication.  -diet modifications encouraged.  - omeprazole (PRILOSEC) 20 MG capsule; Take 1 capsule (20 mg total) by mouth daily.  Dispense: 90 capsule; Refill: 1  8. Post-traumatic osteoarthritis of right ankle Following with ortho with recent steroid injection    Next appt: 11/09/2017 with fasting lipids at time of visit.  Carlos American. [place], [facility] [phone]

## 2017-08-09 NOTE — Patient Instructions (Signed)
To reduce omeprazole to 20 mg daily  Make appt for 3 months, will get fasting blood work at that time.

## 2017-08-11 ENCOUNTER — Telehealth: Payer: Self-pay

## 2017-08-11 DIAGNOSIS — D509 Iron deficiency anemia, unspecified: Secondary | ICD-10-CM

## 2017-08-11 LAB — CBC WITH DIFFERENTIAL/PLATELET
Basophils Absolute: 41 cells/uL (ref 0–200)
Basophils Relative: 0.3 %
EOS ABS: 14 {cells}/uL — AB (ref 15–500)
Eosinophils Relative: 0.1 %
HEMATOCRIT: 34.1 % — AB (ref 35.0–45.0)
HEMOGLOBIN: 10.8 g/dL — AB (ref 11.7–15.5)
LYMPHS ABS: 1688 {cells}/uL (ref 850–3900)
MCH: 24.9 pg — AB (ref 27.0–33.0)
MCHC: 31.7 g/dL — ABNORMAL LOW (ref 32.0–36.0)
MCV: 78.8 fL — AB (ref 80.0–100.0)
MONOS PCT: 4.8 %
MPV: 12.6 fL — ABNORMAL HIGH (ref 7.5–12.5)
NEUTROS ABS: 11111 {cells}/uL — AB (ref 1500–7800)
Neutrophils Relative %: 82.3 %
Platelets: 478 10*3/uL — ABNORMAL HIGH (ref 140–400)
RBC: 4.33 10*6/uL (ref 3.80–5.10)
RDW: 14.5 % (ref 11.0–15.0)
Total Lymphocyte: 12.5 %
WBC: 13.5 10*3/uL — ABNORMAL HIGH (ref 3.8–10.8)
WBCMIX: 648 {cells}/uL (ref 200–950)

## 2017-08-11 LAB — IRON,TIBC AND FERRITIN PANEL
%SAT: 7 % (calc) — ABNORMAL LOW (ref 11–50)
Ferritin: 9 ng/mL — ABNORMAL LOW (ref 20–288)
IRON: 25 ug/dL — AB (ref 45–160)
TIBC: 350 mcg/dL (calc) (ref 250–450)

## 2017-08-11 LAB — COMPLETE METABOLIC PANEL WITH GFR
AG Ratio: 1.4 (calc) (ref 1.0–2.5)
ALT: 14 U/L (ref 6–29)
AST: 13 U/L (ref 10–35)
Albumin: 3.7 g/dL (ref 3.6–5.1)
Alkaline phosphatase (APISO): 80 U/L (ref 33–130)
BILIRUBIN TOTAL: 0.4 mg/dL (ref 0.2–1.2)
BUN/Creatinine Ratio: 24 (calc) — ABNORMAL HIGH (ref 6–22)
BUN: 24 mg/dL (ref 7–25)
CHLORIDE: 101 mmol/L (ref 98–110)
CO2: 29 mmol/L (ref 20–32)
Calcium: 9.7 mg/dL (ref 8.6–10.4)
Creat: 1.01 mg/dL — ABNORMAL HIGH (ref 0.60–0.93)
GFR, EST AFRICAN AMERICAN: 61 mL/min/{1.73_m2} (ref >=60)
GFR, Est Non African American: 53 mL/min/{1.73_m2} — ABNORMAL LOW (ref >=60)
GLUCOSE: 209 mg/dL — AB (ref 65–99)
Globulin: 2.7 g/dL (calc) (ref 1.9–3.7)
Potassium: 4.5 mmol/L (ref 3.5–5.3)
Sodium: 136 mmol/L (ref 135–146)
Total Protein: 6.4 g/dL (ref 6.1–8.1)

## 2017-08-11 LAB — TEST AUTHORIZATION

## 2017-08-11 LAB — HEMOGLOBIN A1C
EAG (MMOL/L): 10.3 (calc)
Hgb A1c MFr Bld: 8.1 % of total Hgb — ABNORMAL HIGH (ref <5.7)
Mean Plasma Glucose: 186 (calc)

## 2017-08-11 NOTE — Telephone Encounter (Signed)
Medication list has been updated and ifob test has been ordered. An IFOB kit has been placed at front desk for patient.

## 2017-08-11 NOTE — Telephone Encounter (Signed)
-----  Message from Lauree Chandler, NP sent at 08/11/2017  4:11 PM EDT ----- Iron levels are low, lets have her start ferrous sulfate 325 mg by mouth twice daily  Also if she can do a home IFOB kit and bring back once complete to evaluate for blood in her stool

## 2017-08-23 ENCOUNTER — Other Ambulatory Visit: Payer: Self-pay | Admitting: *Deleted

## 2017-08-23 MED ORDER — ATORVASTATIN CALCIUM 10 MG PO TABS: 10.0000 mg | ORAL_TABLET | Freq: Every day | ORAL | 1 refills | Status: DC

## 2017-08-23 NOTE — Telephone Encounter (Signed)
[facility] 

## 2017-08-30 ENCOUNTER — Encounter: Payer: Self-pay | Admitting: Cardiovascular Disease

## 2017-08-30 ENCOUNTER — Ambulatory Visit (INDEPENDENT_AMBULATORY_CARE_PROVIDER_SITE_OTHER): Payer: Medicare Other | Admitting: Cardiovascular Disease

## 2017-08-30 VITALS — BP 138/70 | HR 87 | Ht 62.0 in | Wt 203.1 lb

## 2017-08-30 DIAGNOSIS — I10 Essential (primary) hypertension: Secondary | ICD-10-CM | POA: Diagnosis not present

## 2017-08-30 DIAGNOSIS — I5181 Takotsubo syndrome: Secondary | ICD-10-CM | POA: Diagnosis not present

## 2017-08-30 DIAGNOSIS — I25119 Atherosclerotic heart disease of native coronary artery with unspecified angina pectoris: Secondary | ICD-10-CM

## 2017-08-30 MED ORDER — FUROSEMIDE 20 MG PO TABS: 20.0000 mg | ORAL_TABLET | ORAL | 3 refills | Status: DC

## 2017-08-30 NOTE — Patient Instructions (Signed)
Medication Instructions:  1) START LASIX 20 mg every other day  Labwork: IN ONE MONTH! BMET  Testing/Procedures: None  Follow-Up: Your provider wants you to follow-up in: 1 year with Dr. Burt Knack. You will receive a reminder letter in the mail two months in advance. If you don't receive a letter, please call our office to schedule the follow-up appointment.    Any Other Special Instructions Will Be Listed Below (If Applicable).     If you need a refill on your cardiac medications before your next appointment, please call your pharmacy.

## 2017-08-30 NOTE — Progress Notes (Signed)
Cardiology Office Note Date:  08/30/2017   ID:  [name], DOB 14-Jan-1941, MRN [id number]  PCP:  Lauree Chandler, NP  Cardiologist:  Sherren Mocha, MD    Chief Complaint  Patient presents with  . Shortness of Breath     History of Present Illness: [name] is a 77 y.o. female who presents for cardiology follow-up evaluation.  The patient has a history of Takotsubo syndrome in 2016 with cardiac catheterization demonstrating patent coronary arteries and atypical periapical wall motion abnormality.  She presented with recurrent chest pain and shortness of breath in June 2018 and underwent repeat heart catheterization and echo studies, both of which were fairly unrevealing.  Her heart catheterization demonstrated nonobstructive CAD and moderately elevated LVEDP.  The echo demonstrated normal LV function and no significant valvular disease.  She presents today for follow-up evaluation.  She has had a tough time.  Her husband passed away a few months ago.  There has been a lot of difficult dynamics in the family.  She complains of chest pain at rest with pain radiating to the back.  She has shortness of breath with activity.  She complains of swelling in her right leg but not the left leg.  She does not have orthopnea or PND.  She feels a lot of her symptoms are related to a very stressful time that she is going through.  She denies presyncope or frank syncope.   Past Medical History:  Diagnosis Date  . Abdominal pain, unspecified site   . Abnormality of gait   . Apnea   . Cardiomegaly   . Chest pain, unspecified   . Complication of anesthesia    hard time waking up  . Depressive disorder, not elsewhere classified   . Diabetes mellitus   . Dizziness and giddiness   . Dyskinesia of esophagus   . Edema   . Extrinsic asthma, unspecified   . Female stress incontinence   . Gout, unspecified   . High cholesterol   . Hypertension   . Lipoma of other skin and subcutaneous tissue    . Lumbago   . Memory loss   . Migraine without aura, with intractable migraine, so stated, with status migrainosus   . Mild cognitive impairment, so stated   . Nonspecific (abnormal) findings on radiological and other examination of abdominal area, including retroperitoneum   . Nonspecific abnormal results of liver function study   . Obesity, unspecified   . Obstructive chronic bronchitis with exacerbation ([place])   . Other and unspecified hyperlipidemia   . Other B-complex deficiencies   . Other nonspecific abnormal serum enzyme levels   . Other specified cardiac dysrhythmias(427.89)   . Other specified disease of sebaceous glands   . Other symptoms involving cardiovascular system   . Pain in joint, ankle and foot   . Pain in joint, pelvic region and thigh   . Pain in joint, shoulder region   . Palpitations   . Reflux esophagitis   . Shortness of breath   . Tension headache   . Type I (juvenile type) diabetes mellitus without mention of complication, not stated as uncontrolled   . Type I (juvenile type) diabetes mellitus without mention of complication, uncontrolled   . Unspecified essential hypertension   . Unspecified hypothyroidism   . Unspecified vitamin D deficiency   . Ventral hernia, unspecified, without mention of obstruction or gangrene     Past Surgical History:  Procedure Laterality Date  . ABDOMINAL HYSTERECTOMY    .  APPENDECTOMY    . BREAST SURGERY    . EXPLORATORY LAPAROTOMY W/ BOWEL RESECTION  07/25/2004   PROCEDURE: Laparoscopy, open laparotomy, resection of jejunojejunostomy  . INCISIONAL HERNIA REPAIR  03/19/2006   PROCEDURE: Open ventral hernia repair with mesh.  Marland Kitchen LAPAROSCOPIC GASTRIC BYPASS  07/22/2004   PROCEDURE: Laparoscopic Roux-en-Y gastric bypass, antecolic, antegastric,  . LAPAROSCOPIC INCISIONAL / UMBILICAL / VENTRAL HERNIA REPAIR  09/22/2005   PROCEDURE: Laparoscopic ventral hernia repair with mesh.  Marland Kitchen LEFT HEART CATH AND CORONARY ANGIOGRAPHY  N/A 11/20/2016   Procedure: Left Heart Cath and Coronary Angiography;  Surgeon: Sherren Mocha, MD;  Location: [facility];  Service: Cardiovascular;  Laterality: N/A;  . LEFT HEART CATHETERIZATION WITH CORONARY ANGIOGRAM N/A 08/21/2014   Procedure: LEFT HEART CATHETERIZATION WITH CORONARY ANGIOGRAM;  Surgeon: Sherren Mocha, MD;  Location: [facility];  Service: Cardiovascular;  Laterality: N/A;  . stents     last time she said was 1 yr ago     Current Outpatient Medications  Medication Sig Dispense Refill  . amLODipine (NORVASC) 2.5 MG tablet TAKE 1 TABLET (2.5 MG TOTAL) BY MOUTH DAILY. 90 tablet 3  . aspirin 81 MG tablet Take 81 mg by mouth daily.     Marland Kitchen atorvastatin (LIPITOR) 10 MG tablet Take 1 tablet (10 mg total) by mouth daily. In the evening. 90 tablet 1  . Calcium Citrate-Vitamin D (CALCIUM CITRATE + D PO) Take by mouth daily.    . cetirizine (ZYRTEC) 10 MG tablet Take 10 mg by mouth daily as needed for allergies. For allergies    . Cholecalciferol (VITAMIN D3) 5000 UNITS CAPS Take 1 tablet by mouth daily. For supplement    . CINNAMON PO Take 1,000 mg by mouth daily.    . citalopram (CELEXA) 40 MG tablet TAKE 1 TABLET (40 MG TOTAL) BY MOUTH DAILY. 1 BY MOUTH DAILY FOR DEPRESSION 90 tablet 1  . docusate sodium (COLACE) 100 MG capsule Take 1 capsule (100 mg total) by mouth 2 (two) times daily. 180 capsule 3  . ferrous sulfate 325 (65 FE) MG tablet Take 325 mg by mouth 2 (two) times daily with a meal.    . glipiZIDE (GLUCOTROL XL) 10 MG 24 hr tablet TAKE ONE TABLET BY MOUTH IN THE MORNING AND ONE TABLET IN THE EVENING TO CONTROL BLOOD SUGAR 180 tablet 1  . insulin NPH Human (HUMULIN N) 100 UNIT/ML injection Take 35 units in the morning and 35 units in the evening.    . isosorbide mononitrate (IMDUR) 30 MG 24 hr tablet Take 1 tablet (30 mg total) by mouth daily. 90 tablet 3  . losartan-hydrochlorothiazide (HYZAAR) 50-12.5 MG tablet TAKE ONE TABLET BY MOUTH ONCE DAILY TO CONTROL  BLOOD PRESSURE 90 tablet 1  . Magnesium Oxide 250 MG TABS Take 500 mg by mouth daily.     . nitroGLYCERIN (NITROSTAT) 0.4 MG SL tablet Place 1 tablet (0.4 mg total) under the tongue every 5 (five) minutes as needed. For chest pain. Maximum of 3 tablets in 15 minutes. 25 tablet 4  . omeprazole (PRILOSEC) 20 MG capsule Take 1 capsule (20 mg total) by mouth daily. 90 capsule 1  . predniSONE (DELTASONE) 5 MG tablet TAKE 1 TABLET BY MOUTH EVERY MORNING TO REDUCE PAIN 90 tablet 1  . vitamin C (ASCORBIC ACID) 500 MG tablet Take 500 mg by mouth daily.    . furosemide (LASIX) 20 MG tablet Take 1 tablet (20 mg total) by mouth every other day. 45 tablet  3   No current facility-administered medications for this visit.     Allergies:   Ciprofloxacin; Advair diskus [fluticasone-salmeterol]; Albuterol; Alendronate sodium; Benadryl [diphenhydramine hcl]; Benzonatate; Cephalexin; Gabapentin; Iohexol; Keflex [cephalexin]; Morphine; Other; Propoxyphene hcl; Reclast [zoledronic acid]; Avapro [irbesartan]; Codeine; Tessalon perles; and Tramadol   Social History:  The patient  reports that she has never smoked. She has never used smokeless tobacco. She reports that she does not drink alcohol or use drugs.   Family History:  The patient's family history includes Cancer in her brother; Diabetes in her brother, father, and son; Heart disease in her father; Stroke in her father and mother.   ROS:  Please see the history of present illness.  Otherwise, review of systems is positive for chest pain, leg swelling, exertional dyspnea, depression, back pain, easy bruising, excessive fatigue, heart palpitations, wheezing, anxiety, balance problems.  All other systems are reviewed and negative.   PHYSICAL EXAM: VS:  BP 138/70   Pulse 87   Ht 5\' 2"  (1.575 m)   Wt 203 lb 1.9 oz (92.1 kg)   SpO2 96%   BMI 37.15 kg/m  , BMI Body mass index is 37.15 kg/m. GEN: Well nourished, well developed, in no acute distress  HEENT:  normal  Neck: no JVD, no masses. No carotid bruits Cardiac: RRR without murmur or gallop      Respiratory:  clear to auscultation bilaterally, normal work of breathing GI: soft, nontender, obese, large abdominal hernia in the epigastric area MS: no deformity or atrophy  Ext: no pretibial edema, pedal pulses 2+= bilaterally Skin: warm and dry, no rash Neuro:  Strength and sensation are intact Psych: euthymic mood, full affect  EKG:  EKG is ordered today. The ekg ordered today shows NSR 87 bpm, within normal limits  Recent Labs: 11/04/2016: TSH 3.68 08/09/2017: ALT 14; BUN 24; Creat 1.01; Hemoglobin 10.8; Platelets 478; Potassium 4.5; Sodium 136   Lipid Panel     Component Value Date/Time   CHOL 209 (H) 03/17/2017 0935   CHOL 215 (H) 08/07/2015 0854   TRIG 248 (H) 03/17/2017 0935   HDL 47 (L) 03/17/2017 0935   HDL 45 08/07/2015 0854   CHOLHDL 4.4 03/17/2017 0935   VLDL 53 (H) 11/04/2016 0958   LDLCALC 124 (H) 03/17/2017 0935      Wt Readings from Last 3 Encounters:  08/30/17 203 lb 1.9 oz (92.1 kg)  08/09/17 204 lb (92.5 kg)  03/17/17 203 lb 12.8 oz (92.4 kg)     Cardiac Studies Reviewed: Cardiac Cath 11-20-2016: Conclusion   Widely patent coronary arteries with mild nonobstructive CAD, no change from previous study  Recommend: medical therapy.   **if repeat cath needed would NOT use right radial access**  Indications   Chest pain at rest [R07.9 (ICD-10-CM)]  Procedural Details/Technique   Technical Details INDICATION: Chest pain. 77 yo woman with nonobstructive CAD, multiple CV risk factors, and hx of Takotsubo Syndrome is referred for cath to evaluate progressive chest pain in crescendo pattern.   PROCEDURAL DETAILS: The right wrist was prepped, draped, and anesthetized with 1% lidocaine. Using the modified Seldinger technique, a 5/6 French Slender sheath was introduced into the right radial artery. 3 mg of verapamil was administered through the sheath, weight-based  unfractionated heparin was administered intravenously. Standard Judkins catheters were used for selective coronary angiography. The procedure is technically difficult because of extreme angulation from the innominate into the aortic arch. A JR4 guide is used to image the RCA. An angled glide wire  is directed with a JR4 catheter is used to access the ascending aorta. Catheter exchanges were performed over an exchange length guidewire. There were no immediate procedural complications. A TR band was used for radial hemostasis at the completion of the procedure. The patient was transferred to the post catheterization recovery area for further monitoring.    Estimated blood loss <50 mL.  During this procedure the patient was administered the following to achieve and maintain moderate conscious sedation: Versed 1 mg, Fentanyl 25 mcg, while the patient's heart rate, blood pressure, and oxygen saturation were continuously monitored. The period of conscious sedation was 36 minutes, of which I was present face-to-face 100% of this time.  Coronary Findings   Diagnostic  Dominance: Right  Left Anterior Descending  There is mild the vessel.  Prox LAD lesion 30% stenosed  Prox LAD lesion.  Ramus Intermedius  The vessel exhibits minimal luminal irregularities.  Left Circumflex  The vessel exhibits minimal luminal irregularities.  Right Coronary Artery  There is mild the vessel.  Prox RCA lesion 25% stenosed  Prox RCA lesion.  Intervention   No interventions have been documented.  Coronary Diagrams   Diagnostic Diagram        Echo 10/19/2016: Study Conclusions  - Left ventricle: The cavity size was normal. There was mild   concentric hypertrophy. Systolic function was vigorous. The   estimated ejection fraction was in the range of 65% to 70%. Wall   motion was normal; there were no regional wall motion   abnormalities. Doppler parameters are consistent with abnormal   left ventricular  relaxation (grade 1 diastolic dysfunction). - Aortic valve: Trileaflet; normal thickness leaflets. There was no   regurgitation. - Aortic root: The aortic root was normal in size. - Mitral valve: Mildly thickened leaflets . There was no   regurgitation. - Left atrium: The atrium was normal in size. - Right ventricle: The cavity size was normal. Wall thickness was   normal. Systolic function was normal. - Right atrium: The atrium was normal in size. - Tricuspid valve: There was mild regurgitation. - Pulmonic valve: There was no regurgitation. - Pulmonary arteries: Systolic pressure was within the normal   range. - Inferior vena cava: The vessel was normal in size. - Pericardium, extracardiac: A mild pericardial effusion was   identified posterior to the heart. Features were not consistent   with tamponade physiology.  Impressions:  - No significant change since the prior study on 09/03/15.  ASSESSMENT AND PLAN: 1.  CAD, native vessel, with angina: suspect primarily stress-related. The patient has mild nonobstructive by recent cardiac catheterization. Counseling done today.  Continue isosorbide and amlodipine.  2. Takotsubo cardiomyopathy: LV function has normalized. No recurrence.  3. HTN: BP controlled on current Rx. Medications reviewed today. On HCTZ, losartan, and amlodipine.   4. Type II DM: treated by her PCP. Lifestyle modification reviewed with the patient today. Treated with insulin and oral hypoglycemics.   5. Hyperlipidemia: treated with a statin drug. Lifestyle modification reviewed.   Current medicines are reviewed with the patient today.  The patient does not have concerns regarding medicines.  Labs/ tests ordered today include:   Orders Placed This Encounter  Procedures  . Basic metabolic panel  . EKG 12-Lead    Disposition:   FU one year  Signed, Sherren Mocha, MD  08/30/2017 5:29 PM    [facility] [address], [place],  [place]  [address] Phone: [phone]; Fax: [phone]

## 2017-08-31 DIAGNOSIS — H02839 Dermatochalasis of unspecified eye, unspecified eyelid: Secondary | ICD-10-CM | POA: Diagnosis not present

## 2017-08-31 DIAGNOSIS — H40053 Ocular hypertension, bilateral: Secondary | ICD-10-CM | POA: Diagnosis not present

## 2017-08-31 DIAGNOSIS — H35372 Puckering of macula, left eye: Secondary | ICD-10-CM | POA: Diagnosis not present

## 2017-08-31 DIAGNOSIS — Z961 Presence of intraocular lens: Secondary | ICD-10-CM | POA: Diagnosis not present

## 2017-08-31 LAB — HM DIABETES EYE EXAM

## 2017-09-03 ENCOUNTER — Other Ambulatory Visit: Payer: Self-pay | Admitting: Nurse Practitioner

## 2017-09-03 ENCOUNTER — Other Ambulatory Visit: Payer: Self-pay | Admitting: Internal Medicine

## 2017-09-03 ENCOUNTER — Telehealth: Payer: Self-pay | Admitting: *Deleted

## 2017-09-03 MED ORDER — LOSARTAN POTASSIUM 50 MG PO TABS: 50.0000 mg | ORAL_TABLET | Freq: Every day | ORAL | 1 refills | Status: DC

## 2017-09-03 MED ORDER — HYDROCHLOROTHIAZIDE 12.5 MG PO TABS: 12.5000 mg | ORAL_TABLET | Freq: Every day | ORAL | 2 refills | Status: DC

## 2017-09-03 MED ORDER — LOSARTAN POTASSIUM 50 MG PO TABS: 50.0000 mg | ORAL_TABLET | Freq: Every day | ORAL | 2 refills | Status: DC

## 2017-09-03 MED ORDER — HYDROCHLOROTHIAZIDE 12.5 MG PO CAPS
12.5000 mg | ORAL_CAPSULE | Freq: Every day | ORAL | 1 refills | Status: DC
Start: 2017-09-03 — End: 2017-11-23

## 2017-09-03 NOTE — Telephone Encounter (Signed)
[facility] called and stated that patient is requesting refill on her Losartan HCTZ. But this is on backorder and needs 2 separate Rx's faxed to pharmacy. This was already done earlier this morning and faxed to pharmacy. Confirmed.

## 2017-09-03 NOTE — Telephone Encounter (Signed)
A medication refill was received from pharmacy for losartan-hctz. The following message was included in the refill request "THIS MED IS ON BACK ORDER, BUT WE HAVE LOSARTAN AND HCTZ AVAILABLE SEPARATELY. PLEASE SEND 2 SEPARATE RXS. THANKS!"  Pended to provider for approval.

## 2017-09-03 NOTE — Telephone Encounter (Signed)
Patient requested 90 day supply for both of these medications. Rx for #90 with 1 RF were called in to pharmacy.

## 2017-09-10 ENCOUNTER — Other Ambulatory Visit: Payer: Self-pay | Admitting: Internal Medicine

## 2017-09-13 ENCOUNTER — Ambulatory Visit: Payer: Medicare Other | Admitting: Pharmacotherapy

## 2017-09-26 ENCOUNTER — Other Ambulatory Visit: Payer: Self-pay | Admitting: Nurse Practitioner

## 2017-09-26 ENCOUNTER — Other Ambulatory Visit: Payer: Self-pay | Admitting: Internal Medicine

## 2017-09-26 DIAGNOSIS — E113513 Type 2 diabetes mellitus with proliferative diabetic retinopathy with macular edema, bilateral: Secondary | ICD-10-CM

## 2017-09-26 DIAGNOSIS — H543 Unqualified visual loss, both eyes: Principal | ICD-10-CM

## 2017-09-27 ENCOUNTER — Other Ambulatory Visit: Payer: Medicare Other | Admitting: *Deleted

## 2017-09-27 DIAGNOSIS — I10 Essential (primary) hypertension: Secondary | ICD-10-CM

## 2017-09-27 DIAGNOSIS — I5181 Takotsubo syndrome: Secondary | ICD-10-CM | POA: Diagnosis not present

## 2017-09-28 LAB — BASIC METABOLIC PANEL
BUN / CREAT RATIO: 19 (ref 12–28)
BUN: 17 mg/dL (ref 8–27)
CHLORIDE: 101 mmol/L (ref 96–106)
CO2: 23 mmol/L (ref 20–29)
Calcium: 9.1 mg/dL (ref 8.7–10.3)
Creatinine, Ser: 0.9 mg/dL (ref 0.57–1.00)
GFR calc non Af Amer: 61 mL/min/{1.73_m2} (ref >59)
GFR, EST AFRICAN AMERICAN: 70 mL/min/{1.73_m2} (ref >59)
Glucose: 81 mg/dL (ref 65–99)
Potassium: 4 mmol/L (ref 3.5–5.2)
SODIUM: 140 mmol/L (ref 134–144)

## 2017-10-21 ENCOUNTER — Telehealth: Payer: Self-pay | Admitting: Nurse Practitioner

## 2017-10-21 NOTE — Telephone Encounter (Signed)
Left msg asking pt to confirm this AWV-S w/ nurse first before seeing Jessica. VDM (DD)

## 2017-10-25 ENCOUNTER — Other Ambulatory Visit: Payer: Self-pay | Admitting: Nurse Practitioner

## 2017-10-25 DIAGNOSIS — E113513 Type 2 diabetes mellitus with proliferative diabetic retinopathy with macular edema, bilateral: Secondary | ICD-10-CM

## 2017-10-25 DIAGNOSIS — Z794 Long term (current) use of insulin: Principal | ICD-10-CM

## 2017-10-26 MED ORDER — INSULIN NPH (HUMAN) (ISOPHANE) 100 UNIT/ML ~~LOC~~ SUSP: SUBCUTANEOUS | 11 refills | Status: DC

## 2017-10-26 NOTE — Telephone Encounter (Signed)
A medication refill was received from pharmacy for humulin N. Refill was sent to the pharmacy electronically

## 2017-11-01 ENCOUNTER — Other Ambulatory Visit: Payer: Self-pay | Admitting: *Deleted

## 2017-11-01 MED ORDER — AMLODIPINE BESYLATE 2.5 MG PO TABS: ORAL_TABLET | ORAL | 1 refills | Status: DC

## 2017-11-01 NOTE — Telephone Encounter (Signed)
Patient requested. Faxed.  

## 2017-11-06 DIAGNOSIS — H6123 Impacted cerumen, bilateral: Secondary | ICD-10-CM | POA: Diagnosis not present

## 2017-11-09 ENCOUNTER — Ambulatory Visit (INDEPENDENT_AMBULATORY_CARE_PROVIDER_SITE_OTHER): Payer: Medicare Other | Admitting: Nurse Practitioner

## 2017-11-09 ENCOUNTER — Ambulatory Visit: Payer: Medicare Other | Admitting: Nurse Practitioner

## 2017-11-09 ENCOUNTER — Encounter: Payer: Self-pay | Admitting: Nurse Practitioner

## 2017-11-09 ENCOUNTER — Ambulatory Visit (INDEPENDENT_AMBULATORY_CARE_PROVIDER_SITE_OTHER): Payer: Medicare Other

## 2017-11-09 VITALS — BP 124/62 | HR 55 | Temp 98.4°F | Ht 62.0 in | Wt 203.0 lb

## 2017-11-09 VITALS — BP 124/62 | HR 63 | Temp 98.4°F | Ht 62.0 in | Wt 203.0 lb

## 2017-11-09 DIAGNOSIS — I25119 Atherosclerotic heart disease of native coronary artery with unspecified angina pectoris: Secondary | ICD-10-CM | POA: Diagnosis not present

## 2017-11-09 DIAGNOSIS — Z0000 Encounter for general adult medical examination without abnormal findings: Secondary | ICD-10-CM

## 2017-11-09 DIAGNOSIS — K5900 Constipation, unspecified: Secondary | ICD-10-CM

## 2017-11-09 DIAGNOSIS — I10 Essential (primary) hypertension: Secondary | ICD-10-CM

## 2017-11-09 DIAGNOSIS — Z794 Long term (current) use of insulin: Secondary | ICD-10-CM | POA: Diagnosis not present

## 2017-11-09 DIAGNOSIS — E785 Hyperlipidemia, unspecified: Secondary | ICD-10-CM

## 2017-11-09 DIAGNOSIS — D509 Iron deficiency anemia, unspecified: Secondary | ICD-10-CM | POA: Diagnosis not present

## 2017-11-09 DIAGNOSIS — E113513 Type 2 diabetes mellitus with proliferative diabetic retinopathy with macular edema, bilateral: Secondary | ICD-10-CM | POA: Diagnosis not present

## 2017-11-09 MED ORDER — FREESTYLE LIBRE 14 DAY SENSOR MISC: 1.0000 | Freq: Four times a day (QID) | 6 refills | Status: DC

## 2017-11-09 MED ORDER — ZOSTER VAC RECOMB ADJUVANTED 50 MCG/0.5ML IM SUSR: 0.5000 mL | Freq: Once | INTRAMUSCULAR | 1 refills | Status: AC

## 2017-11-09 MED ORDER — FREESTYLE LIBRE 14 DAY READER DEVI: 1.0000 | Freq: Four times a day (QID) | 0 refills | Status: DC

## 2017-11-09 NOTE — Progress Notes (Signed)
Careteam: Patient Care Team: Lauree Chandler, NP as PCP - General (Geriatric Medicine) Excell Seltzer, MD as Consulting Physician (General Surgery) Tanda Rockers, MD as Consulting Physician (Pulmonary Disease) Ronald Lobo, MD as Consulting Physician (Gastroenterology) Jacolyn Reedy, MD as Consulting Physician (Cardiology)  Advanced Directive information Does Patient Have a Medical Advance Directive?: No  Allergies  Allergen Reactions  . Ciprofloxacin Rash  . Advair Diskus [Fluticasone-Salmeterol] Other (See Comments)    Throat closes  . Albuterol     REACTION: closes throat  . Alendronate Sodium Itching  . Benadryl [Diphenhydramine Hcl] Itching  . Benzonatate     REACTION: rash/hives  . Cephalexin Nausea Only  . Gabapentin Other (See Comments)    Loss of memory  . Iohexol      Desc: rash and DIF BREATHING   . Keflex [Cephalexin] Nausea And Vomiting  . Morphine Nausea And Vomiting  . Other Other (See Comments)    Decongestants- keeps pt awake at night, increased heart rate  . Propoxyphene Hcl     Doesn't recall  . Reclast [Zoledronic Acid] Other (See Comments)    Chest pain  . Avapro [Irbesartan] Rash  . Codeine Nausea And Vomiting, Swelling and Rash  . Tessalon Perles Rash  . Tramadol Nausea Only and Rash    Chief Complaint  Patient presents with  . Medical Management of Chronic Issues    Pt is being for a 3 month routine visit. Pt is fasting for labs today.   . ACP    needed     HPI: Patient is a 77 y.o. female seen in the office today for 3 month follow up  DM- did not follow up in office with blood sugars. States blood sugars in the morning are around 120s but can be up and down.  Has been as high as 190s.  Taking NPH 35 units twice daily- sometimes forget Only taking quick acting in the morning if she checks her blood sugar and its high. Also will take it in the middle of the night if it is high (she does not check her blood sugar can  just tell it is "High" Unsure how much quick acting she takes, "thinks it's 5" Also using glipizide. Previously seeing endocrinologist, Dr Forde Dandy (years ago) she got the NPH and regular insulin from him.   Does not cook, eats premade meals.   Anemia- iron was low in March, only taking iron daily. Also had recommended to do a home IFOB but she did not complete.  Has aged out of colonoscopy but never had abnormal.   CAD/cardiomyopahty- following with cardiologist, without chest pain.   Constipation- colace 100 mg by mouth daily as needed   Still grieving her husbands loss (died in 06/18/23) some days are bad and others are good. Had to plan [employer] over the phone and was not able to go. Still gets out and gets her errands done which will wear her out.    Review of Systems:  Review of Systems  Constitutional: Negative for chills, fever and weight loss.  HENT: Negative for congestion, ear pain, hearing loss, sinus pain and sore throat.   Eyes: Negative for blurred vision and pain.  Respiratory: Negative for cough and shortness of breath.   Cardiovascular: Negative for chest pain, palpitations and leg swelling.  Gastrointestinal: Negative for abdominal pain, constipation, diarrhea, heartburn and nausea.  Genitourinary: Negative for dysuria and frequency.  Musculoskeletal: Positive for back pain (lower back) and joint pain (bilateral  hip). Negative for myalgias.  Neurological: Negative for dizziness and headaches.  Psychiatric/Behavioral: Positive for depression. Negative for suicidal ideas. The patient is nervous/anxious.        Worse since husband has passed    Past Medical History:  Diagnosis Date  . Abdominal pain, unspecified site   . Abnormality of gait   . Apnea   . Cardiomegaly   . Chest pain, unspecified   . Complication of anesthesia    hard time waking up  . Depressive disorder, not elsewhere classified   . Diabetes mellitus   . Dizziness and giddiness   . Dyskinesia of  esophagus   . Edema   . Extrinsic asthma, unspecified   . Female stress incontinence   . Gout, unspecified   . High cholesterol   . Hypertension   . Lipoma of other skin and subcutaneous tissue   . Lumbago   . Memory loss   . Migraine without aura, with intractable migraine, so stated, with status migrainosus   . Mild cognitive impairment, so stated   . Nonspecific (abnormal) findings on radiological and other examination of abdominal area, including retroperitoneum   . Nonspecific abnormal results of liver function study   . Obesity, unspecified   . Obstructive chronic bronchitis with exacerbation ([place])   . Other and unspecified hyperlipidemia   . Other B-complex deficiencies   . Other nonspecific abnormal serum enzyme levels   . Other specified cardiac dysrhythmias(427.89)   . Other specified disease of sebaceous glands   . Other symptoms involving cardiovascular system   . Pain in joint, ankle and foot   . Pain in joint, pelvic region and thigh   . Pain in joint, shoulder region   . Palpitations   . Reflux esophagitis   . Shortness of breath   . Tension headache   . Type I (juvenile type) diabetes mellitus without mention of complication, not stated as uncontrolled   . Type I (juvenile type) diabetes mellitus without mention of complication, uncontrolled   . Unspecified essential hypertension   . Unspecified hypothyroidism   . Unspecified vitamin D deficiency   . Ventral hernia, unspecified, without mention of obstruction or gangrene    Past Surgical History:  Procedure Laterality Date  . ABDOMINAL HYSTERECTOMY    . APPENDECTOMY    . BREAST SURGERY    . EXPLORATORY LAPAROTOMY W/ BOWEL RESECTION  07/25/2004   PROCEDURE: Laparoscopy, open laparotomy, resection of jejunojejunostomy  . INCISIONAL HERNIA REPAIR  03/19/2006   PROCEDURE: Open ventral hernia repair with mesh.  Marland Kitchen LAPAROSCOPIC GASTRIC BYPASS  07/22/2004   PROCEDURE: Laparoscopic Roux-en-Y gastric bypass,  antecolic, antegastric,  . LAPAROSCOPIC INCISIONAL / UMBILICAL / VENTRAL HERNIA REPAIR  09/22/2005   PROCEDURE: Laparoscopic ventral hernia repair with mesh.  Marland Kitchen LEFT HEART CATH AND CORONARY ANGIOGRAPHY N/A 11/20/2016   Procedure: Left Heart Cath and Coronary Angiography;  Surgeon: Sherren Mocha, MD;  Location: [facility];  Service: Cardiovascular;  Laterality: N/A;  . LEFT HEART CATHETERIZATION WITH CORONARY ANGIOGRAM N/A 08/21/2014   Procedure: LEFT HEART CATHETERIZATION WITH CORONARY ANGIOGRAM;  Surgeon: Sherren Mocha, MD;  Location: [facility];  Service: Cardiovascular;  Laterality: N/A;  . stents     last time she said was 1 yr ago    Social History:   reports that she has never smoked. She has never used smokeless tobacco. She reports that she does not drink alcohol or use drugs.  Family History  Problem Relation Age of Onset  .  Stroke Mother   . Stroke Father   . Diabetes Father   . Heart disease Father   . Diabetes Son   . Cancer Brother        BLADDER  . Diabetes Brother     Medications: Patient's Medications  New Prescriptions   No medications on file  Previous Medications   AMLODIPINE (NORVASC) 2.5 MG TABLET    Take one tablet by mouth once daily   ASPIRIN 81 MG TABLET    Take 81 mg by mouth daily.    ATORVASTATIN (LIPITOR) 10 MG TABLET    Take 1 tablet (10 mg total) by mouth daily. In the evening.   CALCIUM CITRATE-VITAMIN D (CALCIUM CITRATE + D PO)    Take by mouth daily.   CETIRIZINE (ZYRTEC) 10 MG TABLET    Take 10 mg by mouth daily as needed for allergies. For allergies   CHOLECALCIFEROL (VITAMIN D3) 5000 UNITS CAPS    Take 1 tablet by mouth daily. For supplement   CINNAMON PO    Take 1,000 mg by mouth daily.   CITALOPRAM (CELEXA) 40 MG TABLET    TAKE 1 TABLET BY MOUTH EVERY DAY FOR DEPRESSION   DOCUSATE SODIUM (COLACE) 100 MG CAPSULE    Take 1 capsule (100 mg total) by mouth 2 (two) times daily.   FERROUS SULFATE 325 (65 FE) MG TABLET    Take 325 mg by  mouth 2 (two) times daily with a meal.   FUROSEMIDE (LASIX) 20 MG TABLET    Take 1 tablet (20 mg total) by mouth every other day.   GLIPIZIDE (GLUCOTROL XL) 10 MG 24 HR TABLET    TAKE ONE TABLET BY MOUTH IN THE MORNING AND ONE TABLET IN THE EVENING TO CONTROL BLOOD SUGAR   HYDROCHLOROTHIAZIDE (MICROZIDE) 12.5 MG CAPSULE    Take 1 capsule (12.5 mg total) by mouth daily.   INSULIN NPH HUMAN (HUMULIN N) 100 UNIT/ML INJECTION    Take 35 units in the morning and 35 units in the evening.   INSULIN REGULAR (NOVOLIN R,HUMULIN R) 100 UNITS/ML INJECTION    Inject into the skin as needed for high blood sugar.   INSULIN REGULAR (NOVOLIN R,HUMULIN R) 100 UNITS/ML INJECTION    Inject into the skin 3 (three) times daily before meals.   INSULIN REGULAR (NOVOLIN R,HUMULIN R) 100 UNITS/ML INJECTION    Inject into the skin as needed for high blood sugar. Per sliding scale   ISOSORBIDE MONONITRATE (IMDUR) 30 MG 24 HR TABLET    Take 1 tablet (30 mg total) by mouth daily.   LOSARTAN (COZAAR) 50 MG TABLET    Take 1 tablet (50 mg total) by mouth daily.   LOSARTAN-HYDROCHLOROTHIAZIDE (HYZAAR) 50-12.5 MG TABLET    TAKE 1 TABLET BY MOUTH EVERY DAY FOR BLOOD PRESSURE   MAGNESIUM OXIDE 250 MG TABS    Take 500 mg by mouth daily.    NITROGLYCERIN (NITROSTAT) 0.4 MG SL TABLET    Place 1 tablet (0.4 mg total) under the tongue every 5 (five) minutes as needed. For chest pain. Maximum of 3 tablets in 15 minutes.   OMEPRAZOLE (PRILOSEC) 20 MG CAPSULE    Take 1 capsule (20 mg total) by mouth daily.   PREDNISONE (DELTASONE) 5 MG TABLET    TAKE 1 TABLET BY MOUTH EVERY MORNING TO REDUCE PAIN   VITAMIN C (ASCORBIC ACID) 500 MG TABLET    Take 500 mg by mouth daily.   ZOSTER VACCINE ADJUVANTED [facility]) INJECTION  Inject 0.5 mLs into the muscle once for 1 dose.  Modified Medications   No medications on file  Discontinued Medications   No medications on file     Physical Exam:  Vitals:   11/09/17 0858  BP: 124/62  Pulse: 63    Temp: 98.4 F (36.9 C)  TempSrc: Oral  SpO2: 95%  Weight: 203 lb (92.1 kg)  Height: 5\' 2"  (1.575 m)   Body mass index is 37.13 kg/m.  Physical Exam  Constitutional: She is oriented to person, place, and time. She appears well-developed and well-nourished. No distress.  HENT:  Head: Normocephalic and atraumatic.  Right Ear: External ear normal.  Left Ear: External ear normal.  Nose: Nose normal.  Mouth/Throat: Oropharynx is clear and moist.  Eyes: Pupils are equal, round, and reactive to light. Conjunctivae and EOM are normal. Right eye exhibits no discharge. Left eye exhibits no discharge.  Neck: Normal range of motion. Neck supple.  Cardiovascular: Normal rate, regular rhythm and normal heart sounds.  No murmur heard. Pulmonary/Chest: Effort normal and breath sounds normal. No respiratory distress.  Abdominal: Soft. Bowel sounds are normal. She exhibits no distension. There is no tenderness.  Musculoskeletal: Normal range of motion.  Neurological: She is alert and oriented to person, place, and time.  Skin: Skin is warm and dry. She is not diaphoretic. No erythema.  Psychiatric: She has a normal mood and affect.    Labs reviewed: Basic Metabolic Panel: Recent Labs    03/17/17 0935 08/09/17 1532 09/27/17 1511  NA 139 136 140  K 4.4 4.5 4.0  CL 101 101 101  CO2 31 29 23   GLUCOSE 186* 209* 81  BUN 16 24 17   CREATININE 0.76 1.01* 0.90  CALCIUM 9.2 9.7 9.1   Liver Function Tests: Recent Labs    03/17/17 0935 08/09/17 1532  AST 11 13  ALT 12 14  BILITOT 0.5 0.4  PROT 6.2 6.4   No results for input(s): LIPASE, AMYLASE in the last 8760 hours. No results for input(s): AMMONIA in the last 8760 hours. CBC: Recent Labs    11/17/16 1054 08/09/17 1532  WBC 10.2 13.5*  NEUTROABS  --  11,111*  HGB 11.4 10.8*  HCT 36.4 34.1*  MCV 82 78.8*  PLT 381* 478*   Lipid Panel: Recent Labs    03/17/17 0935  CHOL 209*  HDL 47*  LDLCALC 124*  TRIG 248*  CHOLHDL 4.4    TSH: No results for input(s): TSH in the last 8760 hours. A1C: Lab Results  Component Value Date   HGBA1C 8.1 (H) 08/09/2017     Assessment/Plan 1. Type 2 diabetes mellitus with both eyes affected by proliferative retinopathy and macular edema, with long-term current use of insulin ([place]) -pt did not follow up with [place] or check blood glucose as directed. Again educated on checking blood sugars at least twice daily and recording. Education provided about diet.  -will have her check blood sugars and follow up in 2 weeks with these readings.  - Hemoglobin A1c - COMPLETE METABOLIC PANEL WITH GFR  2. Hyperlipidemia, unspecified hyperlipidemia type fasting today, will obtain labs, noncompliant with diet, does not cook.  -continues on liptior 10 mg daily  - Lipid Panel - COMPLETE METABOLIC PANEL WITH GFR  3. Microcytic anemia -taking iron once daily, will follow up labs.  - CBC with Differential/Platelets - Iron, TIBC and Ferritin Panel  4. Essential hypertension, benign -stable on current regimen. Diet modifications encouraged - COMPLETE METABOLIC PANEL WITH GFR  5. Constipation, unspecified constipation type Stable on colace 100 mg daily  6. Coronary artery disease involving native coronary artery of native heart with angina pectoris ([place]) Without chest pains, to cont current regimen with ASA 81 mg daily, statin and proper blood pressure control.    Next appt: 2 weeks for diabetes  Jessica K. Newald, [facility] [phone]

## 2017-11-09 NOTE — Progress Notes (Signed)
Subjective:   [name] is a 77 y.o. female who presents for Medicare Annual (Subsequent) preventive examination.  Last AWV-11/04/2016    Objective:     Vitals: BP 124/62 (BP Location: Left Arm, Patient Position: Sitting)   Pulse (!) 55   Temp 98.4 F (36.9 C) (Oral)   Ht 5\' 2"  (1.575 m)   Wt 203 lb (92.1 kg)   SpO2 95%   BMI 37.13 kg/m   Body mass index is 37.13 kg/m.  Advanced Directives 11/09/2017 04/27/2017 03/17/2017 11/20/2016 11/10/2016 11/04/2016 07/08/2016  Does Patient Have a Medical Advance Directive? Yes Yes No No Yes No Yes  Type of Advance Directive Living will;Healthcare Power of [place];Living will;Out of facility DNR (pink MOST or yellow form) - - Press photographer;Living will - Living will  Does patient want to make changes to medical advance directive? No - Patient declined - - - - - -  Copy of [place] in Chart? No - copy requested No - copy requested - - No - copy requested - No - copy requested  Would patient like information on creating a medical advance directive? - - - No - Patient declined - No - Patient declined -    Tobacco Social History   Tobacco Use  Smoking Status Never Smoker  Smokeless Tobacco Never Used     Counseling given: Not Answered   Clinical Intake:  Pre-visit preparation completed: No  Pain : No/denies pain     Nutritional Risks: None Diabetes: Yes CBG done?: No Did pt. bring in CBG monitor from home?: No  How often do you need to have someone help you when you read instructions, pamphlets, or other written materials from your doctor or pharmacy?: 1 - Never What is the last grade level you completed in school?: Some College  Interpreter Needed?: No  Information entered by :: [name], RN  Past Medical History:  Diagnosis Date  . Abdominal pain, unspecified site   . Abnormality of gait   . Apnea   . Cardiomegaly   . Chest pain, unspecified   .  Complication of anesthesia    hard time waking up  . Depressive disorder, not elsewhere classified   . Diabetes mellitus   . Dizziness and giddiness   . Dyskinesia of esophagus   . Edema   . Extrinsic asthma, unspecified   . Female stress incontinence   . Gout, unspecified   . High cholesterol   . Hypertension   . Lipoma of other skin and subcutaneous tissue   . Lumbago   . Memory loss   . Migraine without aura, with intractable migraine, so stated, with status migrainosus   . Mild cognitive impairment, so stated   . Nonspecific (abnormal) findings on radiological and other examination of abdominal area, including retroperitoneum   . Nonspecific abnormal results of liver function study   . Obesity, unspecified   . Obstructive chronic bronchitis with exacerbation ([place])   . Other and unspecified hyperlipidemia   . Other B-complex deficiencies   . Other nonspecific abnormal serum enzyme levels   . Other specified cardiac dysrhythmias(427.89)   . Other specified disease of sebaceous glands   . Other symptoms involving cardiovascular system   . Pain in joint, ankle and foot   . Pain in joint, pelvic region and thigh   . Pain in joint, shoulder region   . Palpitations   . Reflux esophagitis   . Shortness of  breath   . Tension headache   . Type I (juvenile type) diabetes mellitus without mention of complication, not stated as uncontrolled   . Type I (juvenile type) diabetes mellitus without mention of complication, uncontrolled   . Unspecified essential hypertension   . Unspecified hypothyroidism   . Unspecified vitamin D deficiency   . Ventral hernia, unspecified, without mention of obstruction or gangrene    Past Surgical History:  Procedure Laterality Date  . ABDOMINAL HYSTERECTOMY    . APPENDECTOMY    . BREAST SURGERY    . EXPLORATORY LAPAROTOMY W/ BOWEL RESECTION  07/25/2004   PROCEDURE: Laparoscopy, open laparotomy, resection of jejunojejunostomy  . INCISIONAL HERNIA  REPAIR  03/19/2006   PROCEDURE: Open ventral hernia repair with mesh.  Marland Kitchen LAPAROSCOPIC GASTRIC BYPASS  07/22/2004   PROCEDURE: Laparoscopic Roux-en-Y gastric bypass, antecolic, antegastric,  . LAPAROSCOPIC INCISIONAL / UMBILICAL / VENTRAL HERNIA REPAIR  09/22/2005   PROCEDURE: Laparoscopic ventral hernia repair with mesh.  Marland Kitchen LEFT HEART CATH AND CORONARY ANGIOGRAPHY N/A 11/20/2016   Procedure: Left Heart Cath and Coronary Angiography;  Surgeon: Sherren Mocha, MD;  Location: [facility];  Service: Cardiovascular;  Laterality: N/A;  . LEFT HEART CATHETERIZATION WITH CORONARY ANGIOGRAM N/A 08/21/2014   Procedure: LEFT HEART CATHETERIZATION WITH CORONARY ANGIOGRAM;  Surgeon: Sherren Mocha, MD;  Location: [facility];  Service: Cardiovascular;  Laterality: N/A;  . stents     last time she said was 1 yr ago    Family History  Problem Relation Age of Onset  . Stroke Mother   . Stroke Father   . Diabetes Father   . Heart disease Father   . Diabetes Son   . Cancer Brother        BLADDER  . Diabetes Brother    Social History   Socioeconomic History  . Marital status: Widowed    Spouse name: Not on file  . Number of children: Not on file  . Years of education: Not on file  . Highest education level: Not on file  Occupational History  . Not on file  Social Needs  . Financial resource strain: Not hard at all  . Food insecurity:    Worry: Never true    Inability: Never true  . Transportation needs:    Medical: No    Non-medical: No  Tobacco Use  . Smoking status: Never Smoker  . Smokeless tobacco: Never Used  Substance and Sexual Activity  . Alcohol use: No  . Drug use: No  . Sexual activity: Not Currently  Lifestyle  . Physical activity:    Days per week: 1 day    Minutes per session: 10 min  . Stress: To some extent  Relationships  . Social connections:    Talks on phone: More than three times a week    Gets together: More than three times a week    Attends  religious service: Never    Active member of club or organization: No    Attends meetings of clubs or organizations: Never    Relationship status: Widowed  Other Topics Concern  . Not on file  Social History Narrative  . Not on file    Outpatient Encounter Medications as of 11/09/2017  Medication Sig  . amLODipine (NORVASC) 2.5 MG tablet Take one tablet by mouth once daily  . aspirin 81 MG tablet Take 81 mg by mouth daily.   Marland Kitchen atorvastatin (LIPITOR) 10 MG tablet Take 1 tablet (10 mg total) by mouth  daily. In the evening.  . Calcium Citrate-Vitamin D (CALCIUM CITRATE + D PO) Take by mouth daily.  . cetirizine (ZYRTEC) 10 MG tablet Take 10 mg by mouth daily as needed for allergies. For allergies  . Cholecalciferol (VITAMIN D3) 5000 UNITS CAPS Take 1 tablet by mouth daily. For supplement  . CINNAMON PO Take 1,000 mg by mouth daily.  . citalopram (CELEXA) 40 MG tablet TAKE 1 TABLET BY MOUTH EVERY DAY FOR DEPRESSION  . docusate sodium (COLACE) 100 MG capsule Take 1 capsule (100 mg total) by mouth 2 (two) times daily.  . ferrous sulfate 325 (65 FE) MG tablet Take 325 mg by mouth 2 (two) times daily with a meal.  . furosemide (LASIX) 20 MG tablet Take 1 tablet (20 mg total) by mouth every other day.  Marland Kitchen glipiZIDE (GLUCOTROL XL) 10 MG 24 hr tablet TAKE ONE TABLET BY MOUTH IN THE MORNING AND ONE TABLET IN THE EVENING TO CONTROL BLOOD SUGAR  . insulin NPH Human (HUMULIN N) 100 UNIT/ML injection Take 35 units in the morning and 35 units in the evening.  . insulin regular (NOVOLIN R,HUMULIN R) 100 units/mL injection Inject into the skin as needed for high blood sugar.  . insulin regular (NOVOLIN R,HUMULIN R) 100 units/mL injection Inject into the skin 3 (three) times daily before meals.  . insulin regular (NOVOLIN R,HUMULIN R) 100 units/mL injection Inject into the skin as needed for high blood sugar. Per sliding scale  . isosorbide mononitrate (IMDUR) 30 MG 24 hr tablet Take 1 tablet (30 mg total) by  mouth daily.  Marland Kitchen losartan-hydrochlorothiazide (HYZAAR) 50-12.5 MG tablet TAKE 1 TABLET BY MOUTH EVERY DAY FOR BLOOD PRESSURE  . Magnesium Oxide 250 MG TABS Take 500 mg by mouth daily.   . nitroGLYCERIN (NITROSTAT) 0.4 MG SL tablet Place 1 tablet (0.4 mg total) under the tongue every 5 (five) minutes as needed. For chest pain. Maximum of 3 tablets in 15 minutes.  Marland Kitchen omeprazole (PRILOSEC) 20 MG capsule Take 1 capsule (20 mg total) by mouth daily.  . predniSONE (DELTASONE) 5 MG tablet TAKE 1 TABLET BY MOUTH EVERY MORNING TO REDUCE PAIN  . vitamin C (ASCORBIC ACID) 500 MG tablet Take 500 mg by mouth daily.  Marland Kitchen Zoster Vaccine Adjuvanted [facility]) injection Inject 0.5 mLs into the muscle once for 1 dose.  . [DISCONTINUED] Zoster Vaccine Adjuvanted [facility]) injection Inject 0.5 mLs into the muscle once.  . hydrochlorothiazide (MICROZIDE) 12.5 MG capsule Take 1 capsule (12.5 mg total) by mouth daily.  Marland Kitchen losartan (COZAAR) 50 MG tablet Take 1 tablet (50 mg total) by mouth daily.   No facility-administered encounter medications on file as of 11/09/2017.     Activities of Daily Living In your present state of health, do you have any difficulty performing the following activities: 11/09/2017 11/20/2016  Hearing? Y N  Vision? N N  Difficulty concentrating or making decisions? Y N  Walking or climbing stairs? Y Y  Dressing or bathing? N N  Doing errands, shopping? N -  Preparing Food and eating ? N -  Using the Toilet? N -  Managing your Medications? N -  Managing your Finances? N -  Housekeeping or managing your Housekeeping? N -  Some recent data might be hidden    Patient Care Team: Lauree Chandler, NP as PCP - General (Geriatric Medicine) Excell Seltzer, MD as Consulting Physician (General Surgery) Tanda Rockers, MD as Consulting Physician (Pulmonary Disease) Ronald Lobo, MD as Consulting Physician (Gastroenterology) Wynonia Lawman,  Grace Bushy, MD as Consulting Physician (Cardiology)      Assessment:   This is a routine wellness examination for Zi.  Exercise Activities and Dietary recommendations Current Exercise Habits: Home exercise routine, Type of exercise: stretching, Time (Minutes): 10, Frequency (Times/Week): 1, Weekly Exercise (Minutes/Week): 10, Intensity: Mild, Exercise limited by: None identified  Goals    None      Fall Risk Fall Risk  11/09/2017 08/09/2017 03/17/2017 11/10/2016 11/04/2016  Falls in the past year? Yes Yes Yes Yes Yes  Number falls in past yr: 2 or more 2 or more 2 or more 2 or more 2 or more  Injury with Fall? No Yes No No No  Comment - Busted right knee, hurt left shoulder, and head injury  - - -   Is the patient's home free of loose throw rugs in walkways, pet beds, electrical cords, etc?   yes      Grab bars in the bathroom? yes      Handrails on the stairs?   yes      Adequate lighting?   yes  Timed Get Up and Go performed: 21 seconds  Depression Screen PHQ 2/9 Scores 11/09/2017 11/04/2016 01/22/2016 12/05/2014  PHQ - 2 Score 1 3 2  0  PHQ- 9 Score - 7 - -     Cognitive Function MMSE - Mini Mental State Exam 11/09/2017 11/04/2016  Orientation to time 5 5  Orientation to Place 5 5  Registration 3 3  Attention/ Calculation 5 5  Recall 3 2  Language- name 2 objects 2 2  Language- repeat 1 1  Language- follow 3 step command 3 3  Language- read & follow direction 1 1  Write a sentence 1 1  Copy design 1 1  Total score 30 29        Immunization History  Administered Date(s) Administered  . DTaP 11/12/2011  . Influenza Split 02/13/2009, 02/28/2014  . Influenza, High Dose Seasonal PF 03/17/2017  . Influenza,inj,Quad PF,6+ Mos 05/01/2015, 03/04/2016  . Influenza-Unspecified 02/22/2013  . Pneumococcal Conjugate-13 06/07/2013  . Pneumococcal Polysaccharide-23 11/04/2016  . Tdap 11/12/2011    Qualifies for Shingles Vaccine? Yes, educated and ordered to pharmacy  Screening Tests Health Maintenance  Topic Date Due  .  OPHTHALMOLOGY EXAM  11/03/2017  . INFLUENZA VACCINE  12/30/2017  . HEMOGLOBIN A1C  02/09/2018  . FOOT EXAM  03/17/2018  . TETANUS/TDAP  11/11/2021  . DEXA SCAN  Completed  . PNA vac Low Risk Adult  Completed    Cancer Screenings: Lung: Low Dose CT Chest recommended if Age 63-80 years, 30 pack-year currently smoking OR have quit w/in 15years. Patient does not qualify. Breast:  Up to date on Mammogram? Yes   Up to date of Bone Density/Dexa? Yes Colorectal: up to date  Additional Screenings:  Hepatitis C Screening: declined Patient Stated her last eye exam was 08/2017, records release form signed    Plan:    I have personally reviewed and addressed the Medicare Annual Wellness questionnaire and have noted the following in the patient's chart:  A. Medical and social history B. Use of alcohol, tobacco or illicit drugs  C. Current medications and supplements D. Functional ability and status E.  Nutritional status F.  Physical activity G. Advance directives H. List of other physicians I.  Hospitalizations, surgeries, and ER visits in previous 12 months J.  [place] to include hearing, vision, cognitive, depression L. Referrals and appointments - none  In addition, I have  reviewed and discussed with patient certain preventive protocols, quality metrics, and best practice recommendations. A written personalized care plan for preventive services as well as general preventive health recommendations were provided to patient.  See attached scanned questionnaire for additional information.   Signed,   [name], RN Nurse Health Advisor  Patient Concerns: None

## 2017-11-09 NOTE — Patient Instructions (Signed)
[name] , Thank you for taking time to come for your Medicare Wellness Visit. I appreciate your ongoing commitment to your health goals. Please review the following plan we discussed and let me know if I can assist you in the future.   Screening recommendations/referrals: Colonoscopy excluded, over age 79 Mammogram excluded, over age 77 Bone Density up to date Recommended yearly ophthalmology/optometry visit for glaucoma screening and checkup Recommended yearly dental visit for hygiene and checkup  Vaccinations: Influenza vaccine up to date, due 2019 fall seaosn Pneumococcal vaccine up to date, completed Tdap vaccine up to date, due 11/11/2021 Shingles vaccine due, ordered to [facility]  Advanced directives: Please bring [place] a copy of your living will and health care power of attorney  Conditions/risks identified: none  Next appointment: Mckenzie Dense, RN 11/11/2018 @ 8:30am   Preventive Care 65 Years and Older, Female Preventive care refers to lifestyle choices and visits with your health care provider that can promote health and wellness. What does preventive care include?  A yearly physical exam. This is also called an annual well check.  Dental exams once or twice a year.  Routine eye exams. Ask your health care provider how often you should have your eyes checked.  Personal lifestyle choices, including:  Daily care of your teeth and gums.  Regular physical activity.  Eating a healthy diet.  Avoiding tobacco and drug use.  Limiting alcohol use.  Practicing safe sex.  Taking low-dose aspirin every day.  Taking vitamin and mineral supplements as recommended by your health care provider. What happens during an annual well check? The services and screenings done by your health care provider during your annual well check will depend on your age, overall health, lifestyle risk factors, and family history of disease. Counseling  Your health care provider may ask you  questions about your:  Alcohol use.  Tobacco use.  Drug use.  Emotional well-being.  Home and relationship well-being.  Sexual activity.  Eating habits.  History of falls.  Memory and ability to understand (cognition).  Work and work Statistician.  Reproductive health. Screening  You may have the following tests or measurements:  Height, weight, and BMI.  Blood pressure.  Lipid and cholesterol levels. These may be checked every 5 years, or more frequently if you are over 55 years old.  Skin check.  Lung cancer screening. You may have this screening every year starting at age 2 if you have a 30-pack-year history of smoking and currently smoke or have quit within the past 15 years.  Fecal occult blood test (FOBT) of the stool. You may have this test every year starting at age 39.  Flexible sigmoidoscopy or colonoscopy. You may have a sigmoidoscopy every 5 years or a colonoscopy every 10 years starting at age 39.  Hepatitis C blood test.  Hepatitis B blood test.  Sexually transmitted disease (STD) testing.  Diabetes screening. This is done by checking your blood sugar (glucose) after you have not eaten for a while (fasting). You may have this done every 1-3 years.  Bone density scan. This is done to screen for osteoporosis. You may have this done starting at age 73.  Mammogram. This may be done every 1-2 years. Talk to your health care provider about how often you should have regular mammograms. Talk with your health care provider about your test results, treatment options, and if necessary, the need for more tests. Vaccines  Your health care provider may recommend certain vaccines, such as:  Influenza  vaccine. This is recommended every year.  Tetanus, diphtheria, and acellular pertussis (Tdap, Td) vaccine. You may need a Td booster every 10 years.  Zoster vaccine. You may need this after age 64.  Pneumococcal 13-valent conjugate (PCV13) vaccine. One dose is  recommended after age 80.  Pneumococcal polysaccharide (PPSV23) vaccine. One dose is recommended after age 63. Talk to your health care provider about which screenings and vaccines you need and how often you need them. This information is not intended to replace advice given to you by your health care provider. Make sure you discuss any questions you have with your health care provider. Document Released: 06/14/2015 Document Revised: 02/05/2016 Document Reviewed: 03/19/2015 Elsevier Interactive Patient Education  2017 [place] Prevention in the Home Falls can cause injuries. They can happen to people of all ages. There are many things you can do to make your home safe and to help prevent falls. What can I do on the outside of my home?  Regularly fix the edges of walkways and driveways and fix any cracks.  Remove anything that might make you trip as you walk through a door, such as a raised step or threshold.  Trim any bushes or trees on the path to your home.  Use bright outdoor lighting.  Clear any walking paths of anything that might make someone trip, such as rocks or tools.  Regularly check to see if handrails are loose or broken. Make sure that both sides of any steps have handrails.  Any raised decks and porches should have guardrails on the edges.  Have any leaves, snow, or ice cleared regularly.  Use sand or salt on walking paths during winter.  Clean up any spills in your garage right away. This includes oil or grease spills. What can I do in the bathroom?  Use night lights.  Install grab bars by the toilet and in the tub and shower. Do not use towel bars as grab bars.  Use non-skid mats or decals in the tub or shower.  If you need to sit down in the shower, use a plastic, non-slip stool.  Keep the floor dry. Clean up any water that spills on the floor as soon as it happens.  Remove soap buildup in the tub or shower regularly.  Attach bath mats  securely with double-sided non-slip rug tape.  Do not have throw rugs and other things on the floor that can make you trip. What can I do in the bedroom?  Use night lights.  Make sure that you have a light by your bed that is easy to reach.  Do not use any sheets or blankets that are too big for your bed. They should not hang down onto the floor.  Have a firm chair that has side arms. You can use this for support while you get dressed.  Do not have throw rugs and other things on the floor that can make you trip. What can I do in the kitchen?  Clean up any spills right away.  Avoid walking on wet floors.  Keep items that you use a lot in easy-to-reach places.  If you need to reach something above you, use a strong step stool that has a grab bar.  Keep electrical cords out of the way.  Do not use floor polish or wax that makes floors slippery. If you must use wax, use non-skid floor wax.  Do not have throw rugs and other things on the floor that can  make you trip. What can I do with my stairs?  Do not leave any items on the stairs.  Make sure that there are handrails on both sides of the stairs and use them. Fix handrails that are broken or loose. Make sure that handrails are as long as the stairways.  Check any carpeting to make sure that it is firmly attached to the stairs. Fix any carpet that is loose or worn.  Avoid having throw rugs at the top or bottom of the stairs. If you do have throw rugs, attach them to the floor with carpet tape.  Make sure that you have a light switch at the top of the stairs and the bottom of the stairs. If you do not have them, ask someone to add them for you. What else can I do to help prevent falls?  Wear shoes that:  Do not have high heels.  Have rubber bottoms.  Are comfortable and fit you well.  Are closed at the toe. Do not wear sandals.  If you use a stepladder:  Make sure that it is fully opened. Do not climb a closed  stepladder.  Make sure that both sides of the stepladder are locked into place.  Ask someone to hold it for you, if possible.  Clearly mark and make sure that you can see:  Any grab bars or handrails.  First and last steps.  Where the edge of each step is.  Use tools that help you move around (mobility aids) if they are needed. These include:  Canes.  Walkers.  Scooters.  Crutches.  Turn on the lights when you go into a dark area. Replace any light bulbs as soon as they burn out.  Set up your furniture so you have a clear path. Avoid moving your furniture around.  If any of your floors are uneven, fix them.  If there are any pets around you, be aware of where they are.  Review your medicines with your doctor. Some medicines can make you feel dizzy. This can increase your chance of falling. Ask your doctor what other things that you can do to help prevent falls. This information is not intended to replace advice given to you by your health care provider. Make sure you discuss any questions you have with your health care provider. Document Released: 03/14/2009 Document Revised: 10/24/2015 Document Reviewed: 06/22/2014 Elsevier Interactive Patient Education  2017 [employer].

## 2017-11-09 NOTE — Patient Instructions (Addendum)
To check blood sugars twice daily (fasting and before supper or bed) and record- bring meter to next office visit.   If you feel like your blood sugar is low check blood sugar and write it down  Follow up in 2 weeks for blood sugar checks    Diabetes Mellitus and Nutrition When you have diabetes (diabetes mellitus), it is very important to have healthy eating habits because your blood sugar (glucose) levels are greatly affected by what you eat and drink. Eating healthy foods in the appropriate amounts, at about the same times every day, can help you:  Control your blood glucose.  Lower your risk of heart disease.  Improve your blood pressure.  Reach or maintain a healthy weight.  Every person with diabetes is different, and each person has different needs for a meal plan. Your health care provider may recommend that you work with a diet and nutrition specialist (dietitian) to make a meal plan that is best for you. Your meal plan may vary depending on factors such as:  The calories you need.  The medicines you take.  Your weight.  Your blood glucose, blood pressure, and cholesterol levels.  Your activity level.  Other health conditions you have, such as heart or kidney disease.  How do carbohydrates affect me? Carbohydrates affect your blood glucose level more than any other type of food. Eating carbohydrates naturally increases the amount of glucose in your blood. Carbohydrate counting is a method for keeping track of how many carbohydrates you eat. Counting carbohydrates is important to keep your blood glucose at a healthy level, especially if you use insulin or take certain oral diabetes medicines. It is important to know how many carbohydrates you can safely have in each meal. This is different for every person. Your dietitian can help you calculate how many carbohydrates you should have at each meal and for snack. Foods that contain carbohydrates include:  Bread, cereal,  rice, pasta, and crackers.  Potatoes and corn.  Peas, beans, and lentils.  Milk and yogurt.  Fruit and juice.  Desserts, such as cakes, cookies, ice cream, and candy.  How does alcohol affect me? Alcohol can cause a sudden decrease in blood glucose (hypoglycemia), especially if you use insulin or take certain oral diabetes medicines. Hypoglycemia can be a life-threatening condition. Symptoms of hypoglycemia (sleepiness, dizziness, and confusion) are similar to symptoms of having too much alcohol. If your health care provider says that alcohol is safe for you, follow these guidelines:  Limit alcohol intake to no more than 1 drink per day for nonpregnant women and 2 drinks per day for men. One drink equals 12 oz of beer, 5 oz of wine, or 1 oz of hard liquor.  Do not drink on an empty stomach.  Keep yourself hydrated with water, diet soda, or unsweetened iced tea.  Keep in mind that regular soda, juice, and other mixers may contain a lot of sugar and must be counted as carbohydrates.  What are tips for following this plan? Reading food labels  Start by checking the serving size on the label. The amount of calories, carbohydrates, fats, and other nutrients listed on the label are based on one serving of the food. Many foods contain more than one serving per package.  Check the total grams (g) of carbohydrates in one serving. You can calculate the number of servings of carbohydrates in one serving by dividing the total carbohydrates by 15. For example, if a food has 30 g of  total carbohydrates, it would be equal to 2 servings of carbohydrates.  Check the number of grams (g) of saturated and trans fats in one serving. Choose foods that have low or no amount of these fats.  Check the number of milligrams (mg) of sodium in one serving. Most people should limit total sodium intake to less than 2,300 mg per day.  Always check the nutrition information of foods labeled as "low-fat" or  "nonfat". These foods may be higher in added sugar or refined carbohydrates and should be avoided.  Talk to your dietitian to identify your daily goals for nutrients listed on the label. Shopping  Avoid buying canned, premade, or processed foods. These foods tend to be high in fat, sodium, and added sugar.  Shop around the outside edge of the grocery store. This includes fresh fruits and vegetables, bulk grains, fresh meats, and fresh dairy. Cooking  Use low-heat cooking methods, such as baking, instead of high-heat cooking methods like deep frying.  Cook using healthy oils, such as olive, canola, or sunflower oil.  Avoid cooking with butter, cream, or high-fat meats. Meal planning  Eat meals and snacks regularly, preferably at the same times every day. Avoid going long periods of time without eating.  Eat foods high in fiber, such as fresh fruits, vegetables, beans, and whole grains. Talk to your dietitian about how many servings of carbohydrates you can eat at each meal.  Eat 4-6 ounces of lean protein each day, such as lean meat, chicken, fish, eggs, or tofu. 1 ounce is equal to 1 ounce of meat, chicken, or fish, 1 egg, or 1/4 cup of tofu.  Eat some foods each day that contain healthy fats, such as avocado, nuts, seeds, and fish. Lifestyle   Check your blood glucose regularly.  Exercise at least 30 minutes 5 or more days each week, or as told by your health care provider.  Take medicines as told by your health care provider.  Do not use any products that contain nicotine or tobacco, such as cigarettes and e-cigarettes. If you need help quitting, ask your health care provider.  Work with a Social worker or diabetes educator to identify strategies to manage stress and any emotional and social challenges. What are some questions to ask my health care provider?  Do I need to meet with a diabetes educator?  Do I need to meet with a dietitian?  What number can I call if I have  questions?  When are the best times to check my blood glucose? Where to find more information:  American Diabetes Association: diabetes.org/food-and-fitness/food  Academy of Nutrition and Dietetics: PokerClues.dk  [employer] of Diabetes and Digestive and Kidney Diseases (NIH): ContactWire.be Summary  A healthy meal plan will help you control your blood glucose and maintain a healthy lifestyle.  Working with a diet and nutrition specialist (dietitian) can help you make a meal plan that is best for you.  Keep in mind that carbohydrates and alcohol have immediate effects on your blood glucose levels. It is important to count carbohydrates and to use alcohol carefully. This information is not intended to replace advice given to you by your health care provider. Make sure you discuss any questions you have with your health care provider. Document Released: 02/12/2005 Document Revised: 06/22/2016 Document Reviewed: 06/22/2016 Elsevier Interactive Patient Education  [employer].

## 2017-11-10 ENCOUNTER — Telehealth: Payer: Self-pay

## 2017-11-10 LAB — CBC WITH DIFFERENTIAL/PLATELET
BASOS ABS: 80 {cells}/uL (ref 0–200)
BASOS PCT: 0.9 %
EOS PCT: 2.4 %
Eosinophils Absolute: 214 cells/uL (ref 15–500)
HCT: 38 % (ref 35.0–45.0)
HEMOGLOBIN: 12.5 g/dL (ref 11.7–15.5)
Lymphs Abs: 2118 cells/uL (ref 850–3900)
MCH: 27.1 pg (ref 27.0–33.0)
MCHC: 32.9 g/dL (ref 32.0–36.0)
MCV: 82.3 fL (ref 80.0–100.0)
MONOS PCT: 5.6 %
MPV: 11.3 fL (ref 7.5–12.5)
NEUTROS ABS: 5990 {cells}/uL (ref 1500–7800)
Neutrophils Relative %: 67.3 %
Platelets: 368 10*3/uL (ref 140–400)
RBC: 4.62 10*6/uL (ref 3.80–5.10)
RDW: 16.1 % — ABNORMAL HIGH (ref 11.0–15.0)
Total Lymphocyte: 23.8 %
WBC mixed population: 498 cells/uL (ref 200–950)
WBC: 8.9 10*3/uL (ref 3.8–10.8)

## 2017-11-10 LAB — COMPLETE METABOLIC PANEL WITH GFR
AG Ratio: 1.5 (calc) (ref 1.0–2.5)
ALT: 32 U/L — AB (ref 6–29)
AST: 24 U/L (ref 10–35)
Albumin: 3.8 g/dL (ref 3.6–5.1)
Alkaline phosphatase (APISO): 100 U/L (ref 33–130)
BUN: 16 mg/dL (ref 7–25)
CO2: 29 mmol/L (ref 20–32)
Calcium: 8.8 mg/dL (ref 8.6–10.4)
Chloride: 103 mmol/L (ref 98–110)
Creat: 0.7 mg/dL (ref 0.60–0.93)
GFR, EST NON AFRICAN AMERICAN: 82 mL/min/{1.73_m2} (ref >=60)
GFR, Est African American: 96 mL/min/{1.73_m2} (ref >=60)
GLOBULIN: 2.5 g/dL (ref 1.9–3.7)
Glucose, Bld: 136 mg/dL — ABNORMAL HIGH (ref 65–99)
Potassium: 4 mmol/L (ref 3.5–5.3)
SODIUM: 141 mmol/L (ref 135–146)
Total Bilirubin: 0.6 mg/dL (ref 0.2–1.2)
Total Protein: 6.3 g/dL (ref 6.1–8.1)

## 2017-11-10 LAB — HEMOGLOBIN A1C
Hgb A1c MFr Bld: 7.2 % of total Hgb — ABNORMAL HIGH (ref <5.7)
Mean Plasma Glucose: 160 (calc)
eAG (mmol/L): 8.9 (calc)

## 2017-11-10 LAB — LIPID PANEL
CHOL/HDL RATIO: 3.7 (calc) (ref <5.0)
CHOLESTEROL: 178 mg/dL (ref <200)
HDL: 48 mg/dL — ABNORMAL LOW (ref >50)
LDL Cholesterol (Calc): 102 mg/dL (calc) — ABNORMAL HIGH
Non-HDL Cholesterol (Calc): 130 mg/dL (calc) — ABNORMAL HIGH (ref <130)
Triglycerides: 166 mg/dL — ABNORMAL HIGH (ref <150)

## 2017-11-10 LAB — IRON,TIBC AND FERRITIN PANEL
%SAT: 20 % (calc) (ref 11–50)
Ferritin: 21 ng/mL (ref 20–288)
IRON: 64 ug/dL (ref 45–160)
TIBC: 317 mcg/dL (calc) (ref 250–450)

## 2017-11-10 MED ORDER — ATORVASTATIN CALCIUM 20 MG PO TABS: 20.0000 mg | ORAL_TABLET | Freq: Every day | ORAL | 1 refills | Status: DC

## 2017-11-10 NOTE — Telephone Encounter (Signed)
-----   Message from Lauree Chandler, NP sent at 11/10/2017  9:10 AM EDT ----- Overall blood work looks better than 3 months ago. A1c has improved to 7.2, hemoglobin and iron are better, okay to continue iron daily at this time. Cholesterol remains elevated but has improved, lets increase Lipitor to 20 mg daily to get her at goal.

## 2017-11-23 ENCOUNTER — Encounter: Payer: Self-pay | Admitting: Nurse Practitioner

## 2017-11-23 ENCOUNTER — Ambulatory Visit (INDEPENDENT_AMBULATORY_CARE_PROVIDER_SITE_OTHER): Payer: Medicare Other | Admitting: Nurse Practitioner

## 2017-11-23 VITALS — BP 148/78 | HR 88 | Temp 98.0°F | Ht 62.0 in | Wt 202.0 lb

## 2017-11-23 DIAGNOSIS — E785 Hyperlipidemia, unspecified: Secondary | ICD-10-CM | POA: Diagnosis not present

## 2017-11-23 DIAGNOSIS — I25119 Atherosclerotic heart disease of native coronary artery with unspecified angina pectoris: Secondary | ICD-10-CM | POA: Diagnosis not present

## 2017-11-23 DIAGNOSIS — E113513 Type 2 diabetes mellitus with proliferative diabetic retinopathy with macular edema, bilateral: Secondary | ICD-10-CM | POA: Diagnosis not present

## 2017-11-23 DIAGNOSIS — D509 Iron deficiency anemia, unspecified: Secondary | ICD-10-CM | POA: Diagnosis not present

## 2017-11-23 DIAGNOSIS — Z794 Long term (current) use of insulin: Secondary | ICD-10-CM

## 2017-11-23 DIAGNOSIS — R42 Dizziness and giddiness: Secondary | ICD-10-CM

## 2017-11-23 MED ORDER — MECLIZINE HCL 25 MG PO TABS: 25.0000 mg | ORAL_TABLET | Freq: Three times a day (TID) | ORAL | 0 refills | Status: DC | PRN

## 2017-11-23 MED ORDER — INSULIN REGULAR HUMAN 100 UNIT/ML IJ SOLN: 5.0000 [IU] | INTRAMUSCULAR | Status: DC | PRN

## 2017-11-23 NOTE — Progress Notes (Signed)
Careteam: Patient Care Team: Lauree Chandler, NP as PCP - General (Geriatric Medicine) Excell Seltzer, MD as Consulting Physician (General Surgery) Tanda Rockers, MD as Consulting Physician (Pulmonary Disease) Ronald Lobo, MD as Consulting Physician (Gastroenterology) Jacolyn Reedy, MD as Consulting Physician (Cardiology)  Advanced Directive information    Allergies  Allergen Reactions  . Ciprofloxacin Rash  . Advair Diskus [Fluticasone-Salmeterol] Other (See Comments)    Throat closes  . Albuterol     REACTION: closes throat  . Alendronate Sodium Itching  . Benadryl [Diphenhydramine Hcl] Itching  . Benzonatate     REACTION: rash/hives  . Cephalexin Nausea Only  . Gabapentin Other (See Comments)    Loss of memory  . Iohexol      Desc: rash and DIF BREATHING   . Keflex [Cephalexin] Nausea And Vomiting  . Morphine Nausea And Vomiting  . Other Other (See Comments)    Decongestants- keeps pt awake at night, increased heart rate  . Propoxyphene Hcl     Doesn't recall  . Reclast [Zoledronic Acid] Other (See Comments)    Chest pain  . Avapro [Irbesartan] Rash  . Codeine Nausea And Vomiting, Swelling and Rash  . Tessalon Perles Rash  . Tramadol Nausea Only and Rash    Chief Complaint  Patient presents with  . Follow-up    Pt is being seen for a 2 week follow up on blood sugars. Pt states that she is having severe vertigo for 2 weeks since having her ears flushed.      HPI: Patient is a 77 y.o. female seen in the office today to follow up on blood sugars.  A1c at last visit was 7.2 which was improved from 8.1 3 months ago.  Pt currently taking glipizide 10 mg by mouth daily with NPH 35 units BID  Reports she has Humlin R that she takes as needed "when blood sugar is high"  Has needed once in the last 2 weeks, uses 5 units.  Has had increased vertigo since ears being flushed at an urgent care. Having more nausea with the vertigo so she is not eating  like she should. Blood sugars have been anywhere from 50 (on time low), 68-285. Felt hypoglycemic when blood sugar was low.  Has been taking meclizine which has helped some but takes a while to work.  Has tired the epley maneuver but this did not help.  No abnormal sinus congestion or headaches.   Review of Systems:  Review of Systems  Constitutional: Negative for chills, fever and weight loss.  HENT: Negative for congestion, ear pain, hearing loss, sinus pain and sore throat.   Eyes: Negative for blurred vision and pain.  Respiratory: Negative for cough and shortness of breath.   Cardiovascular: Negative for chest pain, palpitations and leg swelling.  Gastrointestinal: Negative for abdominal pain, constipation, diarrhea, heartburn and nausea.  Genitourinary: Negative for dysuria and frequency.  Musculoskeletal: Negative for myalgias.  Neurological: Positive for dizziness. Negative for headaches.    Past Medical History:  Diagnosis Date  . Abdominal pain, unspecified site   . Abnormality of gait   . Apnea   . Cardiomegaly   . Chest pain, unspecified   . Complication of anesthesia    hard time waking up  . Depressive disorder, not elsewhere classified   . Diabetes mellitus   . Dizziness and giddiness   . Dyskinesia of esophagus   . Edema   . Extrinsic asthma, unspecified   . Female stress  incontinence   . Gout, unspecified   . High cholesterol   . Hypertension   . Lipoma of other skin and subcutaneous tissue   . Lumbago   . Memory loss   . Migraine without aura, with intractable migraine, so stated, with status migrainosus   . Mild cognitive impairment, so stated   . Nonspecific (abnormal) findings on radiological and other examination of abdominal area, including retroperitoneum   . Nonspecific abnormal results of liver function study   . Obesity, unspecified   . Obstructive chronic bronchitis with exacerbation ([place])   . Other and unspecified hyperlipidemia   . Other  B-complex deficiencies   . Other nonspecific abnormal serum enzyme levels   . Other specified cardiac dysrhythmias(427.89)   . Other specified disease of sebaceous glands   . Other symptoms involving cardiovascular system   . Pain in joint, ankle and foot   . Pain in joint, pelvic region and thigh   . Pain in joint, shoulder region   . Palpitations   . Reflux esophagitis   . Shortness of breath   . Tension headache   . Type I (juvenile type) diabetes mellitus without mention of complication, not stated as uncontrolled   . Type I (juvenile type) diabetes mellitus without mention of complication, uncontrolled   . Unspecified essential hypertension   . Unspecified hypothyroidism   . Unspecified vitamin D deficiency   . Ventral hernia, unspecified, without mention of obstruction or gangrene    Past Surgical History:  Procedure Laterality Date  . ABDOMINAL HYSTERECTOMY    . APPENDECTOMY    . BREAST SURGERY    . EXPLORATORY LAPAROTOMY W/ BOWEL RESECTION  07/25/2004   PROCEDURE: Laparoscopy, open laparotomy, resection of jejunojejunostomy  . INCISIONAL HERNIA REPAIR  03/19/2006   PROCEDURE: Open ventral hernia repair with mesh.  Marland Kitchen LAPAROSCOPIC GASTRIC BYPASS  07/22/2004   PROCEDURE: Laparoscopic Roux-en-Y gastric bypass, antecolic, antegastric,  . LAPAROSCOPIC INCISIONAL / UMBILICAL / VENTRAL HERNIA REPAIR  09/22/2005   PROCEDURE: Laparoscopic ventral hernia repair with mesh.  Marland Kitchen LEFT HEART CATH AND CORONARY ANGIOGRAPHY N/A 11/20/2016   Procedure: Left Heart Cath and Coronary Angiography;  Surgeon: Sherren Mocha, MD;  Location: [facility];  Service: Cardiovascular;  Laterality: N/A;  . LEFT HEART CATHETERIZATION WITH CORONARY ANGIOGRAM N/A 08/21/2014   Procedure: LEFT HEART CATHETERIZATION WITH CORONARY ANGIOGRAM;  Surgeon: Sherren Mocha, MD;  Location: [facility];  Service: Cardiovascular;  Laterality: N/A;  . stents     last time she said was 1 yr ago    Social History:    reports that she has never smoked. She has never used smokeless tobacco. She reports that she does not drink alcohol or use drugs.  Family History  Problem Relation Age of Onset  . Stroke Mother   . Stroke Father   . Diabetes Father   . Heart disease Father   . Diabetes Son   . Cancer Brother        BLADDER  . Diabetes Brother     Medications: Patient's Medications  New Prescriptions   No medications on file  Previous Medications   AMLODIPINE (NORVASC) 2.5 MG TABLET    Take one tablet by mouth once daily   ASPIRIN 81 MG TABLET    Take 81 mg by mouth daily.    ATORVASTATIN (LIPITOR) 20 MG TABLET    Take 1 tablet (20 mg total) by mouth daily.   CALCIUM CITRATE-VITAMIN D (CALCIUM CITRATE + D PO)  Take by mouth daily.   CETIRIZINE (ZYRTEC) 10 MG TABLET    Take 10 mg by mouth daily as needed for allergies. For allergies   CHOLECALCIFEROL (VITAMIN D3) 5000 UNITS CAPS    Take 1 tablet by mouth daily. For supplement   CINNAMON PO    Take 1,000 mg by mouth daily.   CITALOPRAM (CELEXA) 40 MG TABLET    TAKE 1 TABLET BY MOUTH EVERY DAY FOR DEPRESSION   CONTINUOUS BLOOD GLUC RECEIVER (FREESTYLE LIBRE 14 DAY READER) DEVI    1 Device by Subdermal route 4 (four) times daily. Check fasting blood sugar and with each meal. Dx: N82.9562   CONTINUOUS BLOOD GLUC SENSOR (FREESTYLE LIBRE 14 DAY SENSOR) MISC    1 Device by Subdermal route 4 (four) times daily. Check fasting blood sugar and with each meal. Dx: Z30.8657   DOCUSATE SODIUM (COLACE) 100 MG CAPSULE    Take 1 capsule (100 mg total) by mouth 2 (two) times daily.   FERROUS SULFATE 325 (65 FE) MG TABLET    Take 325 mg by mouth 2 (two) times daily with a meal.   FUROSEMIDE (LASIX) 20 MG TABLET    Take 1 tablet (20 mg total) by mouth every other day.   GLIPIZIDE (GLUCOTROL XL) 10 MG 24 HR TABLET    TAKE ONE TABLET BY MOUTH IN THE MORNING AND ONE TABLET IN THE EVENING TO CONTROL BLOOD SUGAR   INSULIN NPH HUMAN (HUMULIN N) 100 UNIT/ML INJECTION    Take  35 units in the morning and 35 units in the evening.   INSULIN REGULAR (NOVOLIN R,HUMULIN R) 100 UNITS/ML INJECTION    Inject into the skin as needed for high blood sugar. Per sliding scale   ISOSORBIDE MONONITRATE (IMDUR) 30 MG 24 HR TABLET    Take 1 tablet (30 mg total) by mouth daily.   LOSARTAN-HYDROCHLOROTHIAZIDE (HYZAAR) 50-12.5 MG TABLET    Take 1 tablet by mouth daily.   MAGNESIUM OXIDE 250 MG TABS    Take 500 mg by mouth daily.    MECLIZINE (ANTIVERT) 25 MG TABLET    Take 25 mg by mouth daily as needed for dizziness.   NITROGLYCERIN (NITROSTAT) 0.4 MG SL TABLET    Place 1 tablet (0.4 mg total) under the tongue every 5 (five) minutes as needed. For chest pain. Maximum of 3 tablets in 15 minutes.   OMEPRAZOLE (PRILOSEC) 20 MG CAPSULE    Take 1 capsule (20 mg total) by mouth daily.   PREDNISONE (DELTASONE) 5 MG TABLET    TAKE 1 TABLET BY MOUTH EVERY MORNING TO REDUCE PAIN   VITAMIN C (ASCORBIC ACID) 500 MG TABLET    Take 500 mg by mouth daily.  Modified Medications   No medications on file  Discontinued Medications   HYDROCHLOROTHIAZIDE (MICROZIDE) 12.5 MG CAPSULE    Take 1 capsule (12.5 mg total) by mouth daily.   INSULIN REGULAR (NOVOLIN R,HUMULIN R) 100 UNITS/ML INJECTION    Inject into the skin as needed for high blood sugar.   INSULIN REGULAR (NOVOLIN R,HUMULIN R) 100 UNITS/ML INJECTION    Inject into the skin 3 (three) times daily before meals.   LOSARTAN (COZAAR) 50 MG TABLET    Take 1 tablet (50 mg total) by mouth daily.   LOSARTAN-HYDROCHLOROTHIAZIDE (HYZAAR) 50-12.5 MG TABLET    TAKE 1 TABLET BY MOUTH EVERY DAY FOR BLOOD PRESSURE     Physical Exam:  Vitals:   11/23/17 1017  BP: (!) 148/78  Pulse: 88  Temp: 98 F (36.7 C)  TempSrc: Oral  SpO2: 96%  Weight: 202 lb (91.6 kg)  Height: 5\' 2"  (1.575 m)   Body mass index is 36.95 kg/m.  Physical Exam  Constitutional: She is oriented to person, place, and time. She appears well-developed and well-nourished. No distress.    HENT:  Head: Normocephalic and atraumatic.  Mouth/Throat: Oropharynx is clear and moist. No oropharyngeal exudate.  Eyes: Pupils are equal, round, and reactive to light. Conjunctivae are normal. Right eye exhibits nystagmus. Left eye exhibits nystagmus.  Neck: Normal range of motion. Neck supple.  Cardiovascular: Normal rate, regular rhythm and normal heart sounds.  Pulmonary/Chest: Effort normal and breath sounds normal.  Abdominal: Soft. Bowel sounds are normal.  Musculoskeletal: She exhibits no edema or tenderness.  Neurological: She is alert and oriented to person, place, and time. No cranial nerve deficit. Coordination normal.  Skin: Skin is warm and dry. She is not diaphoretic.  Psychiatric: She has a normal mood and affect.    Labs reviewed: Basic Metabolic Panel: Recent Labs    08/09/17 1532 09/27/17 1511 11/09/17 0945  NA 136 140 141  K 4.5 4.0 4.0  CL 101 101 103  CO2 29 23 29   GLUCOSE 209* 81 136*  BUN 24 17 16   CREATININE 1.01* 0.90 0.70  CALCIUM 9.7 9.1 8.8   Liver Function Tests: Recent Labs    03/17/17 0935 08/09/17 1532 11/09/17 0945  AST 11 13 24   ALT 12 14 32*  BILITOT 0.5 0.4 0.6  PROT 6.2 6.4 6.3   No results for input(s): LIPASE, AMYLASE in the last 8760 hours. No results for input(s): AMMONIA in the last 8760 hours. CBC: Recent Labs    08/09/17 1532 11/09/17 0945  WBC 13.5* 8.9  NEUTROABS 11,111* 5,990  HGB 10.8* 12.5  HCT 34.1* 38.0  MCV 78.8* 82.3  PLT 478* 368   Lipid Panel: Recent Labs    03/17/17 0935 11/09/17 0945  CHOL 209* 178  HDL 47* 48*  LDLCALC 124* 102*  TRIG 248* 166*  CHOLHDL 4.4 3.7   TSH: No results for input(s): TSH in the last 8760 hours. A1C: Lab Results  Component Value Date   HGBA1C 7.2 (H) 11/09/2017     Assessment/Plan 1. Type 2 diabetes mellitus with both eyes affected by proliferative retinopathy and macular edema, with long-term current use of insulin (HCC) -to continue current regimen.  Encouraged regular meals with proper intake to avoid hypoglycemia.  To continue NPH and glipizide. Using Novolin as needed for blood sugar over 250.  - insulin regular (NOVOLIN R,HUMULIN R) 100 units/mL injection; Inject 0.05 mLs (5 Units total) into the skin as needed for high blood sugar (blood sugars over 250). Per sliding scale  Dispense: 10 mL -A1c prior to next OV  2. Vertigo -since flushing her ears out at urgent care. Responding to meclizine 25 mg by mouth- taking daily, can increase to 8 hours as needed -encouraged to stay hydrated with proper hydration.  -to use caution for increase fatigue with use of meclizine.  - meclizine (ANTIVERT) 25 MG tablet; Take 1 tablet (25 mg total) by mouth 3 (three) times daily as needed for dizziness.  Dispense: 30 tablet; Refill: 0  3. Microcytic anemia Improved on recent labs, continue iron daily, plans to bring in stool for FOB - CBC with Differential/Platelets; Future  4. Hyperlipidemia, unspecified hyperlipidemia type -lipitor increased to 20 mg daily to get her at goal LDL <70.  - COMPLETE METABOLIC PANEL WITH  GFR; Future - Lipid panel; Future  Next appt: 03/18/2018 Carlos American. [place], [facility] [phone]

## 2017-11-23 NOTE — Patient Instructions (Addendum)
To start zyrtec 10 mg by mouth daily May use meclizine every 8 hours as needed for vertigo. To increase hydration  Bland diet and advance as tolerates  To avoid hypoglycemia which could make vertigo worse    Follow up in 3 months with Dr Mariea Clonts, blood work before visit

## 2017-12-05 ENCOUNTER — Other Ambulatory Visit: Payer: Self-pay | Admitting: Nurse Practitioner

## 2017-12-06 LAB — HM DIABETES EYE EXAM

## 2017-12-18 ENCOUNTER — Other Ambulatory Visit: Payer: Self-pay | Admitting: Nurse Practitioner

## 2017-12-21 ENCOUNTER — Other Ambulatory Visit: Payer: Self-pay | Admitting: Nurse Practitioner

## 2017-12-21 DIAGNOSIS — R42 Dizziness and giddiness: Secondary | ICD-10-CM

## 2017-12-29 DIAGNOSIS — D1801 Hemangioma of skin and subcutaneous tissue: Secondary | ICD-10-CM | POA: Diagnosis not present

## 2017-12-29 DIAGNOSIS — D225 Melanocytic nevi of trunk: Secondary | ICD-10-CM | POA: Diagnosis not present

## 2017-12-29 DIAGNOSIS — L820 Inflamed seborrheic keratosis: Secondary | ICD-10-CM | POA: Diagnosis not present

## 2017-12-29 DIAGNOSIS — L814 Other melanin hyperpigmentation: Secondary | ICD-10-CM | POA: Diagnosis not present

## 2017-12-29 DIAGNOSIS — L821 Other seborrheic keratosis: Secondary | ICD-10-CM | POA: Diagnosis not present

## 2017-12-29 DIAGNOSIS — D171 Benign lipomatous neoplasm of skin and subcutaneous tissue of trunk: Secondary | ICD-10-CM | POA: Diagnosis not present

## 2017-12-29 DIAGNOSIS — L565 Disseminated superficial actinic porokeratosis (DSAP): Secondary | ICD-10-CM | POA: Diagnosis not present

## 2018-01-19 ENCOUNTER — Telehealth: Payer: Self-pay | Admitting: *Deleted

## 2018-01-19 NOTE — Telephone Encounter (Signed)
Patient called requesting a 90 day supply on Meclizine 25mg . Medication is in current medication list. Is this ok to refill for 90 days. Please Advise.  (Jessica's patient)

## 2018-01-20 MED ORDER — MECLIZINE HCL 25 MG PO TABS: 25.0000 mg | ORAL_TABLET | Freq: Three times a day (TID) | ORAL | 0 refills | Status: DC

## 2018-01-20 NOTE — Telephone Encounter (Signed)
Change meclizine 25mg  #270 take 1 tab TID for dizziness with no RF

## 2018-01-20 NOTE — Telephone Encounter (Signed)
RX changed  Patient aware rx sent in. Patient also stated most days she take three times daily, yet there is a few days when she only takes as needed.

## 2018-01-20 NOTE — Telephone Encounter (Signed)
Spoke with patient, patient states she is taking meclizine 3 time daily scheduled vs as needed. Patient states she still has vertigo and this is a tremendous help and she is unable to drive unless she takes medication.   Please advise

## 2018-01-20 NOTE — Telephone Encounter (Signed)
How often is she taking medication?

## 2018-01-23 ENCOUNTER — Other Ambulatory Visit: Payer: Self-pay | Admitting: Nurse Practitioner

## 2018-01-23 DIAGNOSIS — G8929 Other chronic pain: Secondary | ICD-10-CM

## 2018-01-23 DIAGNOSIS — M545 Low back pain: Principal | ICD-10-CM

## 2018-02-01 ENCOUNTER — Other Ambulatory Visit: Payer: Self-pay | Admitting: Internal Medicine

## 2018-02-03 ENCOUNTER — Other Ambulatory Visit: Payer: Self-pay | Admitting: Nurse Practitioner

## 2018-02-03 DIAGNOSIS — K219 Gastro-esophageal reflux disease without esophagitis: Secondary | ICD-10-CM

## 2018-03-15 ENCOUNTER — Other Ambulatory Visit: Payer: Self-pay | Admitting: Nurse Practitioner

## 2018-03-15 DIAGNOSIS — E113513 Type 2 diabetes mellitus with proliferative diabetic retinopathy with macular edema, bilateral: Secondary | ICD-10-CM

## 2018-03-15 DIAGNOSIS — H543 Unqualified visual loss, both eyes: Principal | ICD-10-CM

## 2018-03-18 ENCOUNTER — Other Ambulatory Visit: Payer: Medicare Other

## 2018-03-18 ENCOUNTER — Other Ambulatory Visit: Payer: Medicare Other | Admitting: Nurse Practitioner

## 2018-03-21 ENCOUNTER — Other Ambulatory Visit: Payer: Medicare Other

## 2018-03-24 ENCOUNTER — Ambulatory Visit: Payer: Medicare Other | Admitting: Internal Medicine

## 2018-04-06 ENCOUNTER — Other Ambulatory Visit: Payer: Medicare Other

## 2018-04-06 DIAGNOSIS — D509 Iron deficiency anemia, unspecified: Secondary | ICD-10-CM | POA: Diagnosis not present

## 2018-04-06 DIAGNOSIS — E113513 Type 2 diabetes mellitus with proliferative diabetic retinopathy with macular edema, bilateral: Secondary | ICD-10-CM | POA: Diagnosis not present

## 2018-04-06 DIAGNOSIS — E785 Hyperlipidemia, unspecified: Secondary | ICD-10-CM | POA: Diagnosis not present

## 2018-04-06 DIAGNOSIS — Z794 Long term (current) use of insulin: Secondary | ICD-10-CM | POA: Diagnosis not present

## 2018-04-07 LAB — CBC WITH DIFFERENTIAL/PLATELET
Basophils Absolute: 61 {cells}/uL (ref 0–200)
Basophils Relative: 0.7 %
Eosinophils Absolute: 261 {cells}/uL (ref 15–500)
Eosinophils Relative: 3 %
HCT: 39.8 % (ref 35.0–45.0)
Hemoglobin: 12.9 g/dL (ref 11.7–15.5)
Lymphs Abs: 2332 {cells}/uL (ref 850–3900)
MCH: 28.4 pg (ref 27.0–33.0)
MCHC: 32.4 g/dL (ref 32.0–36.0)
MCV: 87.7 fL (ref 80.0–100.0)
MPV: 11.5 fL (ref 7.5–12.5)
Monocytes Relative: 5.8 %
Neutro Abs: 5542 {cells}/uL (ref 1500–7800)
Neutrophils Relative %: 63.7 %
Platelets: 353 10*3/uL (ref 140–400)
RBC: 4.54 Million/uL (ref 3.80–5.10)
RDW: 13 % (ref 11.0–15.0)
Total Lymphocyte: 26.8 %
WBC mixed population: 505 {cells}/uL (ref 200–950)
WBC: 8.7 10*3/uL (ref 3.8–10.8)

## 2018-04-07 LAB — COMPLETE METABOLIC PANEL WITH GFR
AG RATIO: 1.6 (calc) (ref 1.0–2.5)
ALKALINE PHOSPHATASE (APISO): 111 U/L (ref 33–130)
ALT: 18 U/L (ref 6–29)
AST: 14 U/L (ref 10–35)
Albumin: 3.6 g/dL (ref 3.6–5.1)
BUN: 16 mg/dL (ref 7–25)
CALCIUM: 8.8 mg/dL (ref 8.6–10.4)
CO2: 31 mmol/L (ref 20–32)
CREATININE: 0.81 mg/dL (ref 0.60–0.93)
Chloride: 104 mmol/L (ref 98–110)
GFR, EST NON AFRICAN AMERICAN: 69 mL/min/{1.73_m2} (ref >=60)
GFR, Est African American: 80 mL/min/{1.73_m2} (ref >=60)
GLOBULIN: 2.3 g/dL (ref 1.9–3.7)
GLUCOSE: 94 mg/dL (ref 65–99)
Potassium: 4.2 mmol/L (ref 3.5–5.3)
SODIUM: 143 mmol/L (ref 135–146)
TOTAL PROTEIN: 5.9 g/dL — AB (ref 6.1–8.1)
Total Bilirubin: 0.6 mg/dL (ref 0.2–1.2)

## 2018-04-07 LAB — LIPID PANEL
Cholesterol: 171 mg/dL
HDL: 42 mg/dL — ABNORMAL LOW
LDL Cholesterol (Calc): 98 mg/dL
Non-HDL Cholesterol (Calc): 129 mg/dL
Total CHOL/HDL Ratio: 4.1 (calc)
Triglycerides: 213 mg/dL — ABNORMAL HIGH

## 2018-04-07 LAB — HEMOGLOBIN A1C
Hgb A1c MFr Bld: 8 %{Hb} — ABNORMAL HIGH
Mean Plasma Glucose: 183 (calc)
eAG (mmol/L): 10.1 (calc)

## 2018-04-11 ENCOUNTER — Ambulatory Visit (INDEPENDENT_AMBULATORY_CARE_PROVIDER_SITE_OTHER): Payer: Medicare Other | Admitting: Internal Medicine

## 2018-04-11 ENCOUNTER — Encounter: Payer: Self-pay | Admitting: Internal Medicine

## 2018-04-11 VITALS — BP 132/62 | HR 54 | Temp 97.9°F | Ht 62.0 in | Wt 202.0 lb

## 2018-04-11 DIAGNOSIS — G894 Chronic pain syndrome: Secondary | ICD-10-CM | POA: Diagnosis not present

## 2018-04-11 DIAGNOSIS — E785 Hyperlipidemia, unspecified: Secondary | ICD-10-CM | POA: Diagnosis not present

## 2018-04-11 DIAGNOSIS — I10 Essential (primary) hypertension: Secondary | ICD-10-CM

## 2018-04-11 DIAGNOSIS — Z7189 Other specified counseling: Secondary | ICD-10-CM

## 2018-04-11 DIAGNOSIS — F321 Major depressive disorder, single episode, moderate: Secondary | ICD-10-CM | POA: Diagnosis not present

## 2018-04-11 DIAGNOSIS — E113513 Type 2 diabetes mellitus with proliferative diabetic retinopathy with macular edema, bilateral: Secondary | ICD-10-CM | POA: Diagnosis not present

## 2018-04-11 DIAGNOSIS — Z794 Long term (current) use of insulin: Secondary | ICD-10-CM

## 2018-04-11 DIAGNOSIS — I25119 Atherosclerotic heart disease of native coronary artery with unspecified angina pectoris: Secondary | ICD-10-CM

## 2018-04-11 DIAGNOSIS — E1165 Type 2 diabetes mellitus with hyperglycemia: Secondary | ICD-10-CM

## 2018-04-11 MED ORDER — CITALOPRAM HYDROBROMIDE 40 MG PO TABS: 20.0000 mg | ORAL_TABLET | Freq: Every day | ORAL | 0 refills | Status: DC

## 2018-04-11 MED ORDER — FREESTYLE LIBRE 14 DAY SENSOR MISC: 1.0000 | Freq: Four times a day (QID) | 6 refills | Status: DC

## 2018-04-11 MED ORDER — FREESTYLE LIBRE 14 DAY READER DEVI: 1.0000 | Freq: Four times a day (QID) | 0 refills | Status: DC

## 2018-04-11 MED ORDER — DULOXETINE HCL 30 MG PO CPEP: 30.0000 mg | ORAL_CAPSULE | Freq: Every day | ORAL | 3 refills | Status: DC

## 2018-04-11 NOTE — Patient Instructions (Addendum)
Please bring [place] a copy of your living will.    Cut your celexa in 1/2 for 2 weeks.   Then stop celexa. Begin Cymbalta 30mg  daily for 4 weeks.   Diabetes Mellitus and Nutrition When you have diabetes (diabetes mellitus), it is very important to have healthy eating habits because your blood sugar (glucose) levels are greatly affected by what you eat and drink. Eating healthy foods in the appropriate amounts, at about the same times every day, can help you:  Control your blood glucose.  Lower your risk of heart disease.  Improve your blood pressure.  Reach or maintain a healthy weight.  Every person with diabetes is different, and each person has different needs for a meal plan. Your health care provider may recommend that you work with a diet and nutrition specialist (dietitian) to make a meal plan that is best for you. Your meal plan may vary depending on factors such as:  The calories you need.  The medicines you take.  Your weight.  Your blood glucose, blood pressure, and cholesterol levels.  Your activity level.  Other health conditions you have, such as heart or kidney disease.  How do carbohydrates affect me? Carbohydrates affect your blood glucose level more than any other type of food. Eating carbohydrates naturally increases the amount of glucose in your blood. Carbohydrate counting is a method for keeping track of how many carbohydrates you eat. Counting carbohydrates is important to keep your blood glucose at a healthy level, especially if you use insulin or take certain oral diabetes medicines. It is important to know how many carbohydrates you can safely have in each meal. This is different for every person. Your dietitian can help you calculate how many carbohydrates you should have at each meal and for snack. Foods that contain carbohydrates include:  Bread, cereal, rice, pasta, and crackers.  Potatoes and corn.  Peas, beans, and lentils.  Milk and  yogurt.  Fruit and juice.  Desserts, such as cakes, cookies, ice cream, and candy.  How does alcohol affect me? Alcohol can cause a sudden decrease in blood glucose (hypoglycemia), especially if you use insulin or take certain oral diabetes medicines. Hypoglycemia can be a life-threatening condition. Symptoms of hypoglycemia (sleepiness, dizziness, and confusion) are similar to symptoms of having too much alcohol. If your health care provider says that alcohol is safe for you, follow these guidelines:  Limit alcohol intake to no more than 1 drink per day for nonpregnant women and 2 drinks per day for men. One drink equals 12 oz of beer, 5 oz of wine, or 1 oz of hard liquor.  Do not drink on an empty stomach.  Keep yourself hydrated with water, diet soda, or unsweetened iced tea.  Keep in mind that regular soda, juice, and other mixers may contain a lot of sugar and must be counted as carbohydrates.  What are tips for following this plan? Reading food labels  Start by checking the serving size on the label. The amount of calories, carbohydrates, fats, and other nutrients listed on the label are based on one serving of the food. Many foods contain more than one serving per package.  Check the total grams (g) of carbohydrates in one serving. You can calculate the number of servings of carbohydrates in one serving by dividing the total carbohydrates by 15. For example, if a food has 30 g of total carbohydrates, it would be equal to 2 servings of carbohydrates.  Check the number of grams (  g) of saturated and trans fats in one serving. Choose foods that have low or no amount of these fats.  Check the number of milligrams (mg) of sodium in one serving. Most people should limit total sodium intake to less than 2,300 mg per day.  Always check the nutrition information of foods labeled as "low-fat" or "nonfat". These foods may be higher in added sugar or refined carbohydrates and should be  avoided.  Talk to your dietitian to identify your daily goals for nutrients listed on the label. Shopping  Avoid buying canned, premade, or processed foods. These foods tend to be high in fat, sodium, and added sugar.  Shop around the outside edge of the grocery store. This includes fresh fruits and vegetables, bulk grains, fresh meats, and fresh dairy. Cooking  Use low-heat cooking methods, such as baking, instead of high-heat cooking methods like deep frying.  Cook using healthy oils, such as olive, canola, or sunflower oil.  Avoid cooking with butter, cream, or high-fat meats. Meal planning  Eat meals and snacks regularly, preferably at the same times every day. Avoid going long periods of time without eating.  Eat foods high in fiber, such as fresh fruits, vegetables, beans, and whole grains. Talk to your dietitian about how many servings of carbohydrates you can eat at each meal.  Eat 4-6 ounces of lean protein each day, such as lean meat, chicken, fish, eggs, or tofu. 1 ounce is equal to 1 ounce of meat, chicken, or fish, 1 egg, or 1/4 cup of tofu.  Eat some foods each day that contain healthy fats, such as avocado, nuts, seeds, and fish. Lifestyle   Check your blood glucose regularly.  Exercise at least 30 minutes 5 or more days each week, or as told by your health care provider.  Take medicines as told by your health care provider.  Do not use any products that contain nicotine or tobacco, such as cigarettes and e-cigarettes. If you need help quitting, ask your health care provider.  Work with a Social worker or diabetes educator to identify strategies to manage stress and any emotional and social challenges. What are some questions to ask my health care provider?  Do I need to meet with a diabetes educator?  Do I need to meet with a dietitian?  What number can I call if I have questions?  When are the best times to check my blood glucose? Where to find more  information:  American Diabetes Association: diabetes.org/food-and-fitness/food  Academy of Nutrition and Dietetics: PokerClues.dk  [employer] of Diabetes and Digestive and Kidney Diseases (NIH): ContactWire.be Summary  A healthy meal plan will help you control your blood glucose and maintain a healthy lifestyle.  Working with a diet and nutrition specialist (dietitian) can help you make a meal plan that is best for you.  Keep in mind that carbohydrates and alcohol have immediate effects on your blood glucose levels. It is important to count carbohydrates and to use alcohol carefully. This information is not intended to replace advice given to you by your health care provider. Make sure you discuss any questions you have with your health care provider. Document Released: 02/12/2005 Document Revised: 06/22/2016 Document Reviewed: 06/22/2016 Elsevier Interactive Patient Education  [employer].
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
## 2018-04-11 NOTE — Addendum Note (Signed)
Addended by: Gayland Curry on: 04/11/2018 05:33 PM   Modules accepted: Orders

## 2018-04-14 ENCOUNTER — Other Ambulatory Visit: Payer: Self-pay | Admitting: Internal Medicine

## 2018-04-14 DIAGNOSIS — F321 Major depressive disorder, single episode, moderate: Secondary | ICD-10-CM

## 2018-04-14 DIAGNOSIS — G894 Chronic pain syndrome: Secondary | ICD-10-CM

## 2018-04-18 ENCOUNTER — Other Ambulatory Visit: Payer: Self-pay | Admitting: Internal Medicine

## 2018-04-18 DIAGNOSIS — F321 Major depressive disorder, single episode, moderate: Secondary | ICD-10-CM

## 2018-04-18 DIAGNOSIS — G894 Chronic pain syndrome: Secondary | ICD-10-CM

## 2018-04-19 ENCOUNTER — Other Ambulatory Visit: Payer: Self-pay | Admitting: Nurse Practitioner

## 2018-04-19 DIAGNOSIS — G8929 Other chronic pain: Secondary | ICD-10-CM

## 2018-04-19 DIAGNOSIS — M545 Low back pain, unspecified: Secondary | ICD-10-CM

## 2018-04-19 NOTE — Telephone Encounter (Signed)
A medication refill was received from pharmacy for prednisone 5 mg. Rx was pended to provider for approval due to very high interaction warnings.

## 2018-05-05 DIAGNOSIS — H43811 Vitreous degeneration, right eye: Secondary | ICD-10-CM | POA: Diagnosis not present

## 2018-05-05 DIAGNOSIS — E113391 Type 2 diabetes mellitus with moderate nonproliferative diabetic retinopathy without macular edema, right eye: Secondary | ICD-10-CM | POA: Diagnosis not present

## 2018-05-05 DIAGNOSIS — H35043 Retinal micro-aneurysms, unspecified, bilateral: Secondary | ICD-10-CM | POA: Diagnosis not present

## 2018-05-05 DIAGNOSIS — H35372 Puckering of macula, left eye: Secondary | ICD-10-CM | POA: Diagnosis not present

## 2018-05-05 LAB — HM DIABETES EYE EXAM

## 2018-05-07 ENCOUNTER — Other Ambulatory Visit: Payer: Self-pay | Admitting: Nurse Practitioner

## 2018-05-09 ENCOUNTER — Ambulatory Visit: Payer: Medicare Other | Admitting: Internal Medicine

## 2018-05-12 DIAGNOSIS — H35373 Puckering of macula, bilateral: Secondary | ICD-10-CM | POA: Diagnosis not present

## 2018-05-12 DIAGNOSIS — H40053 Ocular hypertension, bilateral: Secondary | ICD-10-CM | POA: Diagnosis not present

## 2018-05-12 DIAGNOSIS — Z961 Presence of intraocular lens: Secondary | ICD-10-CM | POA: Diagnosis not present

## 2018-05-12 DIAGNOSIS — H18413 Arcus senilis, bilateral: Secondary | ICD-10-CM | POA: Diagnosis not present

## 2018-05-20 ENCOUNTER — Other Ambulatory Visit: Payer: Self-pay | Admitting: Nurse Practitioner

## 2018-05-23 NOTE — Telephone Encounter (Signed)
Left message on voicemail informing patient rx sent to pharmacy  

## 2018-05-23 NOTE — Telephone Encounter (Signed)
Patient called back and stated she is unable to schedule an appointment this time and plans to call and schedule after the first of the year. Patient needs refill on insulin and would like a call once medication is filled   Please advise

## 2018-05-23 NOTE — Telephone Encounter (Signed)
Patient due for 4 week appointment called patient to schedule appointment.Left voicemail to return call

## 2018-06-02 ENCOUNTER — Encounter: Payer: Self-pay | Admitting: Internal Medicine

## 2018-06-02 ENCOUNTER — Ambulatory Visit (INDEPENDENT_AMBULATORY_CARE_PROVIDER_SITE_OTHER): Payer: Medicare Other | Admitting: Internal Medicine

## 2018-06-02 VITALS — BP 120/70 | HR 75 | Ht 62.0 in | Wt 202.0 lb

## 2018-06-02 DIAGNOSIS — F321 Major depressive disorder, single episode, moderate: Secondary | ICD-10-CM | POA: Diagnosis not present

## 2018-06-02 DIAGNOSIS — E1165 Type 2 diabetes mellitus with hyperglycemia: Secondary | ICD-10-CM | POA: Diagnosis not present

## 2018-06-02 DIAGNOSIS — I10 Essential (primary) hypertension: Secondary | ICD-10-CM | POA: Diagnosis not present

## 2018-06-02 MED ORDER — INSULIN NPH (HUMAN) (ISOPHANE) 100 UNIT/ML ~~LOC~~ SUSP: SUBCUTANEOUS | 0 refills | Status: DC

## 2018-06-02 MED ORDER — INSULIN NPH (HUMAN) (ISOPHANE) 100 UNIT/ML ~~LOC~~ SUSP: SUBCUTANEOUS | 3 refills | Status: DC

## 2018-06-02 NOTE — Progress Notes (Signed)
Location:  [facility] clinic Provider:  Tahlor Berenguer L. Mariea Clonts, D.O., C.M.D.  Code Status: DNR Goals of Care:  Advanced Directives 11/09/2017  Does Patient Have a Medical Advance Directive? No  Type of Advance Directive -  Does patient want to make changes to medical advance directive? -  Copy of [place] in Chart? -  Would patient like information on creating a medical advance directive? -     Chief Complaint  Patient presents with  . Medical Management of Chronic Issues    4 week follow-up on depression    HPI: Patient is a 78 y.o. female seen today for a follow-up on her depression.  Last visit I changed her from celexa to cymbalta, but she never changed it.  She looked up the side effects and asked the pharmacist who confirmed these.  Says celexa is the only one she could tolerate.  She says she did great over the holidays b/c she got to see her family--both sons.  It was a blessing for her.    Has been using freestyle libre, but it's very expensive and only lasts 14 days.   Still running high.  She's up to 40 units nph bid in place of 35 units.  She does not cook and does not exercise.  Rarely goes out except for essential trips.  She has pain in her feet, ankles, knees so cannot exercise.     Has had some fluttering of her heart, takes deep breaths and it resolves.  It's happening more often than it used to.    She is due for her second shingrix this month at the pharmacy.  Didn't put her hearing aids in today.     Past Medical History:  Diagnosis Date  . Abdominal pain, unspecified site   . Abnormality of gait   . Apnea   . Cardiomegaly   . Chest pain, unspecified   . Complication of anesthesia    hard time waking up  . Depressive disorder, not elsewhere classified   . Diabetes mellitus   . Diabetic retinopathy ([place])   . Dizziness and giddiness   . Dyskinesia of esophagus   . Edema   . Extrinsic asthma, unspecified   . Female stress incontinence   .  Gout, unspecified   . High cholesterol   . Hypertension   . Lipoma of other skin and subcutaneous tissue   . Lumbago   . Memory loss   . Migraine without aura, with intractable migraine, so stated, with status migrainosus   . Mild cognitive impairment, so stated   . Nonspecific (abnormal) findings on radiological and other examination of abdominal area, including retroperitoneum   . Nonspecific abnormal results of liver function study   . Obesity, unspecified   . Obstructive chronic bronchitis with exacerbation ([place])   . Other and unspecified hyperlipidemia   . Other B-complex deficiencies   . Other nonspecific abnormal serum enzyme levels   . Other specified cardiac dysrhythmias(427.89)   . Other specified disease of sebaceous glands   . Other symptoms involving cardiovascular system   . Pain in joint, ankle and foot   . Pain in joint, pelvic region and thigh   . Pain in joint, shoulder region   . Palpitations   . Reflux esophagitis   . Shortness of breath   . Tension headache   . Type I (juvenile type) diabetes mellitus without mention of complication, not stated as uncontrolled   . Type I (juvenile type) diabetes  mellitus without mention of complication, uncontrolled   . Unspecified essential hypertension   . Unspecified hypothyroidism   . Unspecified vitamin D deficiency   . Ventral hernia, unspecified, without mention of obstruction or gangrene     Past Surgical History:  Procedure Laterality Date  . ABDOMINAL HYSTERECTOMY    . APPENDECTOMY    . BREAST SURGERY    . EXPLORATORY LAPAROTOMY W/ BOWEL RESECTION  07/25/2004   PROCEDURE: Laparoscopy, open laparotomy, resection of jejunojejunostomy  . INCISIONAL HERNIA REPAIR  03/19/2006   PROCEDURE: Open ventral hernia repair with mesh.  Marland Kitchen LAPAROSCOPIC GASTRIC BYPASS  07/22/2004   PROCEDURE: Laparoscopic Roux-en-Y gastric bypass, antecolic, antegastric,  . LAPAROSCOPIC INCISIONAL / UMBILICAL / VENTRAL HERNIA REPAIR   09/22/2005   PROCEDURE: Laparoscopic ventral hernia repair with mesh.  Marland Kitchen LEFT HEART CATH AND CORONARY ANGIOGRAPHY N/A 11/20/2016   Procedure: Left Heart Cath and Coronary Angiography;  Surgeon: Sherren Mocha, MD;  Location: [facility];  Service: Cardiovascular;  Laterality: N/A;  . LEFT HEART CATHETERIZATION WITH CORONARY ANGIOGRAM N/A 08/21/2014   Procedure: LEFT HEART CATHETERIZATION WITH CORONARY ANGIOGRAM;  Surgeon: Sherren Mocha, MD;  Location: [facility] CATH LAB;  Service: Cardiovascular;  Laterality: N/A;  . stents     last time she said was 1 yr ago     Allergies  Allergen Reactions  . Ciprofloxacin Rash  . Advair Diskus [Fluticasone-Salmeterol] Other (See Comments)    Throat closes  . Albuterol     REACTION: closes throat  . Alendronate Sodium Itching  . Benadryl [Diphenhydramine Hcl] Itching  . Benzonatate     REACTION: rash/hives  . Cephalexin Nausea Only  . Gabapentin Other (See Comments)    Loss of memory  . Iohexol      Desc: rash and DIF BREATHING   . Keflex [Cephalexin] Nausea And Vomiting  . Morphine Nausea And Vomiting  . Other Other (See Comments)    Decongestants- keeps pt awake at night, increased heart rate  . Propoxyphene Hcl     Doesn't recall  . Reclast [Zoledronic Acid] Other (See Comments)    Chest pain  . Avapro [Irbesartan] Rash  . Codeine Nausea And Vomiting, Swelling and Rash  . Tessalon Perles Rash  . Tramadol Nausea Only and Rash    Outpatient Encounter Medications as of 06/02/2018  Medication Sig  . amLODipine (NORVASC) 2.5 MG tablet Take one tablet by mouth once daily  . aspirin 81 MG tablet Take 81 mg by mouth daily.   Marland Kitchen atorvastatin (LIPITOR) 20 MG tablet TAKE 1 TABLET BY MOUTH EVERY DAY  . Calcium Citrate-Vitamin D (CALCIUM CITRATE + D PO) Take by mouth daily.  . cetirizine (ZYRTEC) 10 MG tablet Take 10 mg by mouth daily as needed for allergies. For allergies  . Cholecalciferol (VITAMIN D3) 5000 UNITS CAPS Take 1 tablet by mouth  daily. For supplement  . CINNAMON PO Take 1,000 mg by mouth daily.  . Continuous Blood Gluc Sensor (FREESTYLE LIBRE 14 DAY SENSOR) MISC 1 Device by Subdermal route 4 (four) times daily. Check fasting blood sugar and with each meal. Dx: V89.3810  . docusate sodium (COLACE) 100 MG capsule Take 100 mg by mouth 2 (two) times daily.  . ferrous sulfate 325 (65 FE) MG tablet Take 325 mg by mouth 2 (two) times daily with a meal.  . furosemide (LASIX) 20 MG tablet Take 1 tablet (20 mg total) by mouth every other day.  Marland Kitchen glipiZIDE (GLUCOTROL XL) 10 MG 24 hr tablet TAKE ONE  TABLET BY MOUTH IN THE MORNING AND ONE TABLET IN THE EVENING TO CONTROL BLOOD SUGAR  . insulin NPH Human (HUMULIN N) 100 UNIT/ML injection INJECT 35 UNITS IN THE MORNING AND 35 UNITS IN THE EVENING.  . insulin regular (NOVOLIN R,HUMULIN R) 100 units/mL injection Inject 0.05 mLs (5 Units total) into the skin as needed for high blood sugar (blood sugars over 250). Per sliding scale  . isosorbide mononitrate (IMDUR) 30 MG 24 hr tablet TAKE 1 TABLET BY MOUTH EVERY DAY  . losartan-hydrochlorothiazide (HYZAAR) 50-12.5 MG tablet TAKE 1 TABLET BY MOUTH EVERY DAY FOR BLOOD PRESSURE  . Magnesium Oxide 250 MG TABS Take 500 mg by mouth daily.   . meclizine (ANTIVERT) 25 MG tablet Take 1 tablet (25 mg total) by mouth 3 (three) times daily. For dizziness  . nitroGLYCERIN (NITROSTAT) 0.4 MG SL tablet Place 0.4 mg under the tongue every 5 (five) minutes as needed for chest pain.  Marland Kitchen omeprazole (PRILOSEC) 20 MG capsule TAKE 1 CAPSULE BY MOUTH EVERY DAY  . predniSONE (DELTASONE) 5 MG tablet TAKE 1 TABLET BY MOUTH EVERY MORNING TO REDUCE PAIN  . vitamin C (ASCORBIC ACID) 500 MG tablet Take 500 mg by mouth daily.  . citalopram (CELEXA) 40 MG tablet Take 0.5 tablets (20 mg total) by mouth daily for 14 days.  . [DISCONTINUED] Continuous Blood Gluc Receiver (FREESTYLE LIBRE 14 DAY READER) DEVI 1 Device by Subdermal route 4 (four) times daily. Check fasting blood  sugar and with each meal. Dx: P82.4235  . [DISCONTINUED] DULoxetine (CYMBALTA) 30 MG capsule Take 1 capsule (30 mg total) by mouth daily.   No facility-administered encounter medications on file as of 06/02/2018.     Review of Systems:  Review of Systems  Constitutional: Positive for malaise/fatigue. Negative for chills and fever.  HENT: Positive for hearing loss. Negative for congestion.        Not wearing her hearing aids  Eyes: Negative for blurred vision and discharge.       Glasses  Respiratory: Positive for wheezing. Negative for cough and shortness of breath.   Cardiovascular: Positive for palpitations. Negative for chest pain and leg swelling.  Gastrointestinal: Negative for abdominal pain, constipation, nausea and vomiting.       Hernia  Genitourinary: Negative for dysuria.  Musculoskeletal: Positive for joint pain. Negative for falls and myalgias.  Skin: Negative for itching and rash.  Neurological: Negative for dizziness and loss of consciousness.  Endo/Heme/Allergies: Bruises/bleeds easily.  Psychiatric/Behavioral: Positive for depression. Negative for memory loss. The patient is not nervous/anxious.        Spirits improved after seeing family over the holidays    Health Maintenance  Topic Date Due  . HEMOGLOBIN A1C  10/05/2018  . FOOT EXAM  04/12/2019  . OPHTHALMOLOGY EXAM  05/06/2019  . TETANUS/TDAP  11/11/2021  . INFLUENZA VACCINE  Completed  . DEXA SCAN  Completed  . PNA vac Low Risk Adult  Completed    Physical Exam: Vitals:   06/02/18 0835  Weight: 202 lb (91.6 kg)  Height: 5\' 2"  (1.575 m)   Body mass index is 36.95 kg/m. Physical Exam Vitals signs reviewed.  Constitutional:      General: She is not in acute distress.    Appearance: She is obese. She is not ill-appearing.  HENT:     Head: Normocephalic and atraumatic.     Ears:     Comments: HOH    Nose: Nose normal.  Eyes:     Comments: glasses  Cardiovascular:     Rate and Rhythm: Normal  rate and regular rhythm.     Pulses: Normal pulses.     Heart sounds: Normal heart sounds. No murmur. No friction rub. No gallop.   Pulmonary:     Effort: Pulmonary effort is normal.     Breath sounds: Normal breath sounds. No wheezing.  Abdominal:     General: Bowel sounds are normal. There is no distension.     Palpations: Abdomen is soft.     Tenderness: There is no abdominal tenderness. There is no guarding or rebound.     Hernia: A hernia is present.  Musculoskeletal: Normal range of motion.        General: No tenderness.  Skin:    General: Skin is warm and dry.     Capillary Refill: Capillary refill takes less than 2 seconds.  Neurological:     Mental Status: She is alert and oriented to person, place, and time.     Comments: Right AFO in place  Psychiatric:        Mood and Affect: Mood normal.     Comments: Pleasant and more cheerful this time     Labs reviewed: Basic Metabolic Panel: Recent Labs    09/27/17 1511 11/09/17 0945 04/06/18 0845  NA 140 141 143  K 4.0 4.0 4.2  CL 101 103 104  CO2 23 29 31   GLUCOSE 81 136* 94  BUN 17 16 16   CREATININE 0.90 0.70 0.81  CALCIUM 9.1 8.8 8.8   Liver Function Tests: Recent Labs    08/09/17 1532 11/09/17 0945 04/06/18 0845  AST 13 24 14   ALT 14 32* 18  BILITOT 0.4 0.6 0.6  PROT 6.4 6.3 5.9*   No results for input(s): LIPASE, AMYLASE in the last 8760 hours. No results for input(s): AMMONIA in the last 8760 hours. CBC: Recent Labs    08/09/17 1532 11/09/17 0945 04/06/18 0845  WBC 13.5* 8.9 8.7  NEUTROABS 11,111* 5,990 5,542  HGB 10.8* 12.5 12.9  HCT 34.1* 38.0 39.8  MCV 78.8* 82.3 87.7  PLT 478* 368 353   Lipid Panel: Recent Labs    11/09/17 0945 04/06/18 0845  CHOL 178 171  HDL 48* 42*  LDLCALC 102* 98  TRIG 166* 213*  CHOLHDL 3.7 4.1   Lab Results  Component Value Date   HGBA1C 8.0 (H) 04/06/2018    Procedures since last visit: No results found.  Assessment/Plan 1. Depression, major,  single episode, moderate (HCC) -cont celexa as pt afraid to change to cymbalta  2. Uncontrolled type 2 diabetes mellitus with hyperglycemia (HCC) - increased her NPH dose as below and sent 3 mo supply to pharmacy - insulin NPH Human (HUMULIN N) 100 UNIT/ML injection; INJECT 40 UNITS IN THE MORNING AND 40 UNITS IN THE EVENING.  Dispense: 60 mL; Refill: 3 - CBC with Differential/Platelet; Future - Basic metabolic panel; Future - Hemoglobin A1c; Future - Lipid panel; Future  3. Essential hypertension, benign -bp well controlled but pt does not allow it to be taken normally--cont ARB/hctz - Basic metabolic panel; Future  Labs/tests ordered:   Orders Placed This Encounter  Procedures  . CBC with Differential/Platelet    Standing Status:   Future    Standing Expiration Date:   06/03/2019  . Basic metabolic panel    Standing Status:   Future    Standing Expiration Date:   06/03/2019    Order Specific Question:   Has the patient fasted?  Answer:   Yes  . Hemoglobin A1c    Standing Status:   Future    Standing Expiration Date:   06/03/2019  . Lipid panel    Standing Status:   Future    Standing Expiration Date:   06/03/2019    Order Specific Question:   Has the patient fasted?    Answer:   Yes    Next appt:  11/11/2018   Elga Santy L. Nitza Schmid, D.O. [facility] [address], [place] [address] Cell Phone (Mon-Fri 8am-5pm):  [phone] On Call:  [phone] & follow prompts after 5pm & weekends Office Phone:  [phone] Office Fax:  [phone]

## 2018-06-11 ENCOUNTER — Other Ambulatory Visit: Payer: Self-pay | Admitting: Nurse Practitioner

## 2018-06-11 DIAGNOSIS — H543 Unqualified visual loss, both eyes: Principal | ICD-10-CM

## 2018-06-11 DIAGNOSIS — E113513 Type 2 diabetes mellitus with proliferative diabetic retinopathy with macular edema, bilateral: Secondary | ICD-10-CM

## 2018-06-12 ENCOUNTER — Other Ambulatory Visit: Payer: Self-pay | Admitting: Nurse Practitioner

## 2018-06-14 ENCOUNTER — Other Ambulatory Visit: Payer: Self-pay | Admitting: Nurse Practitioner

## 2018-07-11 ENCOUNTER — Other Ambulatory Visit: Payer: Self-pay | Admitting: Nurse Practitioner

## 2018-07-25 ENCOUNTER — Other Ambulatory Visit: Payer: Medicare Other

## 2018-07-28 ENCOUNTER — Other Ambulatory Visit: Payer: Self-pay | Admitting: Nurse Practitioner

## 2018-07-28 ENCOUNTER — Ambulatory Visit: Payer: Medicare Other | Admitting: Internal Medicine

## 2018-07-28 DIAGNOSIS — K219 Gastro-esophageal reflux disease without esophagitis: Secondary | ICD-10-CM

## 2018-08-27 ENCOUNTER — Other Ambulatory Visit: Payer: Self-pay | Admitting: Cardiovascular Disease

## 2018-09-19 ENCOUNTER — Other Ambulatory Visit: Payer: Self-pay | Admitting: Internal Medicine

## 2018-09-19 DIAGNOSIS — H543 Unqualified visual loss, both eyes: Principal | ICD-10-CM

## 2018-09-19 DIAGNOSIS — E113513 Type 2 diabetes mellitus with proliferative diabetic retinopathy with macular edema, bilateral: Secondary | ICD-10-CM

## 2018-11-04 ENCOUNTER — Other Ambulatory Visit: Payer: Self-pay | Admitting: *Deleted

## 2018-11-04 MED ORDER — INSULIN SYRINGES (DISPOSABLE) U-100 1 ML MISC: 3 refills | Status: DC

## 2018-11-04 NOTE — Telephone Encounter (Signed)
[facility]

## 2018-11-05 ENCOUNTER — Other Ambulatory Visit: Payer: Self-pay | Admitting: Nurse Practitioner

## 2018-11-11 ENCOUNTER — Ambulatory Visit: Payer: Self-pay

## 2018-11-11 ENCOUNTER — Encounter: Payer: Self-pay | Admitting: Family

## 2018-11-11 ENCOUNTER — Encounter: Payer: Medicare Other | Admitting: Family

## 2018-11-11 ENCOUNTER — Ambulatory Visit (INDEPENDENT_AMBULATORY_CARE_PROVIDER_SITE_OTHER): Payer: Medicare Other | Admitting: Family

## 2018-11-11 ENCOUNTER — Other Ambulatory Visit: Payer: Self-pay

## 2018-11-11 DIAGNOSIS — Z0000 Encounter for general adult medical examination without abnormal findings: Secondary | ICD-10-CM | POA: Diagnosis not present

## 2018-11-11 NOTE — Progress Notes (Signed)
   This service is provided via telemedicine  No vital signs collected/recorded due to the encounter was a telemedicine visit.   Location of patient (ex: home, work):  Home   Patient consents to a telephone visit:  Yes   Location of the provider (ex: office, home): Office   Name of any referring provider: Dr. Hollace Kinnier   Names of all persons participating in the telemedicine service and their role in the encounter:  Ruthell Rummage CMA, Dinah [name] NP, Fraser Din   Time spent on call:  Ruthell Rummage CMA, 14  Minutes on phone with patient

## 2018-11-11 NOTE — Patient Instructions (Signed)
[name] , Thank you for taking time to come for your Medicare Wellness Visit. I appreciate your ongoing commitment to your health goals. Please review the following plan we discussed and let me know if I can assist you in the future.   Screening recommendations/referrals: Colonoscopy: N/A  Mammogram: N/A  Bone Density: Up to date  Recommended yearly ophthalmology/optometry visit for glaucoma screening and checkup Recommended yearly dental visit for hygiene and checkup  Vaccinations: Influenza vaccine : Up to date  Pneumococcal vaccine : Up to date  Tdap vaccine : Up to date due next 11/11/2021  Shingles vaccine : Up to date    Advanced directives: Yes   Conditions/risks identified:Advance age female >20 Yrs,Type 2 DM,Hypertension,Obesity,hyperlipidemia,sedentary lifestyle    Next appointment: 1 Year   Preventive Care 80 Years and Older, Female Preventive care refers to lifestyle choices and visits with your health care provider that can promote health and wellness. What does preventive care include?  A yearly physical exam. This is also called an annual well check.  Dental exams once or twice a year.  Routine eye exams. Ask your health care provider how often you should have your eyes checked.  Personal lifestyle choices, including:  Daily care of your teeth and gums.  Regular physical activity.  Eating a healthy diet.  Avoiding tobacco and drug use.  Limiting alcohol use.  Practicing safe sex.  Taking low-dose aspirin every day.  Taking vitamin and mineral supplements as recommended by your health care provider. What happens during an annual well check? The services and screenings done by your health care provider during your annual well check will depend on your age, overall health, lifestyle risk factors, and family history of disease. Counseling  Your health care provider may ask you questions about your:  Alcohol use.  Tobacco use.  Drug use.   Emotional well-being.  Home and relationship well-being.  Sexual activity.  Eating habits.  History of falls.  Memory and ability to understand (cognition).  Work and work Statistician.  Reproductive health. Screening  You may have the following tests or measurements:  Height, weight, and BMI.  Blood pressure.  Lipid and cholesterol levels. These may be checked every 5 years, or more frequently if you are over 50 years old.  Skin check.  Lung cancer screening. You may have this screening every year starting at age 80 if you have a 30-pack-year history of smoking and currently smoke or have quit within the past 15 years.  Fecal occult blood test (FOBT) of the stool. You may have this test every year starting at age 80.  Flexible sigmoidoscopy or colonoscopy. You may have a sigmoidoscopy every 5 years or a colonoscopy every 10 years starting at age 80.  Hepatitis C blood test.  Hepatitis B blood test.  Sexually transmitted disease (STD) testing.  Diabetes screening. This is done by checking your blood sugar (glucose) after you have not eaten for a while (fasting). You may have this done every 1-3 years.  Bone density scan. This is done to screen for osteoporosis. You may have this done starting at age 80.  Mammogram. This may be done every 1-2 years. Talk to your health care provider about how often you should have regular mammograms. Talk with your health care provider about your test results, treatment options, and if necessary, the need for more tests. Vaccines  Your health care provider may recommend certain vaccines, such as:  Influenza vaccine. This is recommended every year.  Tetanus,  diphtheria, and acellular pertussis (Tdap, Td) vaccine. You may need a Td booster every 10 years.  Zoster vaccine. You may need this after age 31.  Pneumococcal 13-valent conjugate (PCV13) vaccine. One dose is recommended after age 80.  Pneumococcal polysaccharide (PPSV23)  vaccine. One dose is recommended after age 80. Talk to your health care provider about which screenings and vaccines you need and how often you need them. This information is not intended to replace advice given to you by your health care provider. Make sure you discuss any questions you have with your health care provider. Document Released: 06/14/2015 Document Revised: 02/05/2016 Document Reviewed: 03/19/2015 Elsevier Interactive Patient Education  2017 [place] Prevention in the Home Falls can cause injuries. They can happen to people of all ages. There are many things you can do to make your home safe and to help prevent falls. What can I do on the outside of my home?  Regularly fix the edges of walkways and driveways and fix any cracks.  Remove anything that might make you trip as you walk through a door, such as a raised step or threshold.  Trim any bushes or trees on the path to your home.  Use bright outdoor lighting.  Clear any walking paths of anything that might make someone trip, such as rocks or tools.  Regularly check to see if handrails are loose or broken. Make sure that both sides of any steps have handrails.  Any raised decks and porches should have guardrails on the edges.  Have any leaves, snow, or ice cleared regularly.  Use sand or salt on walking paths during winter.  Clean up any spills in your garage right away. This includes oil or grease spills. What can I do in the bathroom?  Use night lights.  Install grab bars by the toilet and in the tub and shower. Do not use towel bars as grab bars.  Use non-skid mats or decals in the tub or shower.  If you need to sit down in the shower, use a plastic, non-slip stool.  Keep the floor dry. Clean up any water that spills on the floor as soon as it happens.  Remove soap buildup in the tub or shower regularly.  Attach bath mats securely with double-sided non-slip rug tape.  Do not have throw rugs  and other things on the floor that can make you trip. What can I do in the bedroom?  Use night lights.  Make sure that you have a light by your bed that is easy to reach.  Do not use any sheets or blankets that are too big for your bed. They should not hang down onto the floor.  Have a firm chair that has side arms. You can use this for support while you get dressed.  Do not have throw rugs and other things on the floor that can make you trip. What can I do in the kitchen?  Clean up any spills right away.  Avoid walking on wet floors.  Keep items that you use a lot in easy-to-reach places.  If you need to reach something above you, use a strong step stool that has a grab bar.  Keep electrical cords out of the way.  Do not use floor polish or wax that makes floors slippery. If you must use wax, use non-skid floor wax.  Do not have throw rugs and other things on the floor that can make you trip. What can I do with  my stairs?  Do not leave any items on the stairs.  Make sure that there are handrails on both sides of the stairs and use them. Fix handrails that are broken or loose. Make sure that handrails are as long as the stairways.  Check any carpeting to make sure that it is firmly attached to the stairs. Fix any carpet that is loose or worn.  Avoid having throw rugs at the top or bottom of the stairs. If you do have throw rugs, attach them to the floor with carpet tape.  Make sure that you have a light switch at the top of the stairs and the bottom of the stairs. If you do not have them, ask someone to add them for you. What else can I do to help prevent falls?  Wear shoes that:  Do not have high heels.  Have rubber bottoms.  Are comfortable and fit you well.  Are closed at the toe. Do not wear sandals.  If you use a stepladder:  Make sure that it is fully opened. Do not climb a closed stepladder.  Make sure that both sides of the stepladder are locked into  place.  Ask someone to hold it for you, if possible.  Clearly mark and make sure that you can see:  Any grab bars or handrails.  First and last steps.  Where the edge of each step is.  Use tools that help you move around (mobility aids) if they are needed. These include:  Canes.  Walkers.  Scooters.  Crutches.  Turn on the lights when you go into a dark area. Replace any light bulbs as soon as they burn out.  Set up your furniture so you have a clear path. Avoid moving your furniture around.  If any of your floors are uneven, fix them.  If there are any pets around you, be aware of where they are.  Review your medicines with your doctor. Some medicines can make you feel dizzy. This can increase your chance of falling. Ask your doctor what other things that you can do to help prevent falls. This information is not intended to replace advice given to you by your health care provider. Make sure you discuss any questions you have with your health care provider. Document Released: 03/14/2009 Document Revised: 10/24/2015 Document Reviewed: 06/22/2014 Elsevier Interactive Patient Education  2017 [employer].

## 2018-11-11 NOTE — Progress Notes (Signed)
Subjective:   [name] is a 78 y.o. female who presents for Medicare Annual (Subsequent) preventive examination.  Review of Systems:   Cardiac Risk Factors include: advanced age (>33men, >54 women);diabetes mellitus;hypertension;obesity (BMI >30kg/m2);sedentary lifestyle;dyslipidemia     Objective:     Vitals: There were no vitals taken for this visit.  There is no height or weight on file to calculate BMI.  Advanced Directives 11/11/2018 11/09/2017 11/09/2017 04/27/2017 03/17/2017 11/20/2016 11/10/2016  Does Patient Have a Medical Advance Directive? Yes No Yes Yes No No Yes  Type of Advance Directive - - Living will;Healthcare Power of [place];Living will;Out of facility DNR (pink MOST or yellow form) - - Press photographer;Living will  Does patient want to make changes to medical advance directive? No - Patient declined - No - Patient declined - - - -  Copy of [place] in Chart? - - No - copy requested No - copy requested - - No - copy requested  Would patient like information on creating a medical advance directive? - - - - - No - Patient declined -    Tobacco Social History   Tobacco Use  Smoking Status Never Smoker  Smokeless Tobacco Never Used     Counseling given: Not Answered   Clinical Intake:  Pre-visit preparation completed: No  Pain : No/denies pain     BMI - recorded: 36.95 Nutritional Status: BMI > 30  Obese Nutritional Risks: None Diabetes: Yes CBG done?: No Did pt. bring in CBG monitor from home?: No(120's)  How often do you need to have someone help you when you read instructions, pamphlets, or other written materials from your doctor or pharmacy?: 1 - Never What is the last grade level you completed in school?: college  Interpreter Needed?: No  Information entered by :: Dianh  FNP-C  Past Medical History:  Diagnosis Date  . Abdominal pain, unspecified site   . Abnormality  of gait   . Apnea   . Cardiomegaly   . Chest pain, unspecified   . Complication of anesthesia    hard time waking up  . Depressive disorder, not elsewhere classified   . Diabetes mellitus   . Diabetic retinopathy ([place])   . Dizziness and giddiness   . Dyskinesia of esophagus   . Edema   . Extrinsic asthma, unspecified   . Female stress incontinence   . Gout, unspecified   . High cholesterol   . Hypertension   . Lipoma of other skin and subcutaneous tissue   . Lumbago   . Memory loss   . Migraine without aura, with intractable migraine, so stated, with status migrainosus   . Mild cognitive impairment, so stated   . Nonspecific (abnormal) findings on radiological and other examination of abdominal area, including retroperitoneum   . Nonspecific abnormal results of liver function study   . Obesity, unspecified   . Obstructive chronic bronchitis with exacerbation ([place])   . Other and unspecified hyperlipidemia   . Other B-complex deficiencies   . Other nonspecific abnormal serum enzyme levels   . Other specified cardiac dysrhythmias(427.89)   . Other specified disease of sebaceous glands   . Other symptoms involving cardiovascular system   . Pain in joint, ankle and foot   . Pain in joint, pelvic region and thigh   . Pain in joint, shoulder region   . Palpitations   . Reflux esophagitis   . Shortness of breath   .  Tension headache   . Type I (juvenile type) diabetes mellitus without mention of complication, not stated as uncontrolled   . Type I (juvenile type) diabetes mellitus without mention of complication, uncontrolled   . Unspecified essential hypertension   . Unspecified hypothyroidism   . Unspecified vitamin D deficiency   . Ventral hernia, unspecified, without mention of obstruction or gangrene    Past Surgical History:  Procedure Laterality Date  . ABDOMINAL HYSTERECTOMY    . APPENDECTOMY    . BREAST SURGERY    . EXPLORATORY LAPAROTOMY W/ BOWEL RESECTION   07/25/2004   PROCEDURE: Laparoscopy, open laparotomy, resection of jejunojejunostomy  . INCISIONAL HERNIA REPAIR  03/19/2006   PROCEDURE: Open ventral hernia repair with mesh.  Marland Kitchen LAPAROSCOPIC GASTRIC BYPASS  07/22/2004   PROCEDURE: Laparoscopic Roux-en-Y gastric bypass, antecolic, antegastric,  . LAPAROSCOPIC INCISIONAL / UMBILICAL / VENTRAL HERNIA REPAIR  09/22/2005   PROCEDURE: Laparoscopic ventral hernia repair with mesh.  Marland Kitchen LEFT HEART CATH AND CORONARY ANGIOGRAPHY N/A 11/20/2016   Procedure: Left Heart Cath and Coronary Angiography;  Surgeon: Sherren Mocha, MD;  Location: [facility];  Service: Cardiovascular;  Laterality: N/A;  . LEFT HEART CATHETERIZATION WITH CORONARY ANGIOGRAM N/A 08/21/2014   Procedure: LEFT HEART CATHETERIZATION WITH CORONARY ANGIOGRAM;  Surgeon: Sherren Mocha, MD;  Location: [facility] C[facility];  Service: Cardiovascular;  Laterality: N/A;  . stents     last time she said was 1 yr ago    Family History  Problem Relation Age of Onset  . Stroke Mother   . Stroke Father   . Diabetes Father   . Heart disease Father   . Diabetes Son   . Cancer Brother        BLADDER  . Diabetes Brother    Social History   Socioeconomic History  . Marital status: Widowed    Spouse name: Not on file  . Number of children: Not on file  . Years of education: Not on file  . Highest education level: Not on file  Occupational History  . Not on file  Social Needs  . Financial resource strain: Not hard at all  . Food insecurity    Worry: Never true    Inability: Never true  . Transportation needs    Medical: No    Non-medical: No  Tobacco Use  . Smoking status: Never Smoker  . Smokeless tobacco: Never Used  Substance and Sexual Activity  . Alcohol use: No  . Drug use: No  . Sexual activity: Not Currently  Lifestyle  . Physical activity    Days per week: 1 day    Minutes per session: 10 min  . Stress: To some extent  Relationships  . Social connections    Talks  on phone: More than three times a week    Gets together: More than three times a week    Attends religious service: Never    Active member of club or organization: No    Attends meetings of clubs or organizations: Never    Relationship status: Widowed  Other Topics Concern  . Not on file  Social History Narrative  . Not on file    Outpatient Encounter Medications as of 11/11/2018  Medication Sig  . amLODipine (NORVASC) 2.5 MG tablet TAKE 1 TABLET BY MOUTH EVERY DAY  . aspirin 81 MG tablet Take 81 mg by mouth daily.   Marland Kitchen atorvastatin (LIPITOR) 20 MG tablet TAKE 1 TABLET BY MOUTH EVERY DAY  . Calcium Citrate-Vitamin D (  CALCIUM CITRATE + D PO) Take by mouth daily.  . cetirizine (ZYRTEC) 10 MG tablet Take 10 mg by mouth daily as needed for allergies. For allergies  . Cholecalciferol (VITAMIN D3) 5000 UNITS CAPS Take 1 tablet by mouth daily. For supplement  . CINNAMON PO Take 1,000 mg by mouth daily.  . citalopram (CELEXA) 40 MG tablet TAKE 1 TABLET BY MOUTH EVERY DAY FOR DEPRESSION  . Continuous Blood Gluc Sensor (FREESTYLE LIBRE 14 DAY SENSOR) MISC 1 Device by Subdermal route 4 (four) times daily. Check fasting blood sugar and with each meal. Dx: H41.9379  . docusate sodium (COLACE) 100 MG capsule Take 100 mg by mouth 2 (two) times daily.  . ferrous sulfate 325 (65 FE) MG tablet Take 325 mg by mouth 2 (two) times daily with a meal.  . furosemide (LASIX) 20 MG tablet Take 1 tablet (20 mg total) by mouth every other day. Pt needs to call and make appt to continue getting refills. Thanks  . glipiZIDE (GLUCOTROL XL) 10 MG 24 hr tablet TAKE ONE TABLET BY MOUTH IN THE MORNING AND ONE TABLET IN THE EVENING TO CONTROL BLOOD SUGAR  . insulin NPH Human (HUMULIN N) 100 UNIT/ML injection INJECT 40 UNITS IN THE MORNING AND 40 UNITS IN THE EVENING.  . insulin regular (NOVOLIN R,HUMULIN R) 100 units/mL injection Inject 0.05 mLs (5 Units total) into the skin as needed for high blood sugar (blood sugars over  250). Per sliding scale  . Insulin Syringes, Disposable, U-100 1 ML MISC Use to inject insulin Dx: K24.0973  . isosorbide mononitrate (IMDUR) 30 MG 24 hr tablet TAKE 1 TABLET BY MOUTH EVERY DAY  . losartan-hydrochlorothiazide (HYZAAR) 50-12.5 MG tablet TAKE 1 TABLET BY MOUTH EVERY DAY FOR BLOOD PRESSURE  . Magnesium Oxide 250 MG TABS Take 500 mg by mouth daily.   . meclizine (ANTIVERT) 25 MG tablet Take 1 tablet (25 mg total) by mouth 3 (three) times daily. For dizziness  . nitroGLYCERIN (NITROSTAT) 0.4 MG SL tablet Place 0.4 mg under the tongue every 5 (five) minutes as needed for chest pain.  Marland Kitchen omeprazole (PRILOSEC) 20 MG capsule TAKE 1 CAPSULE BY MOUTH EVERY DAY  . predniSONE (DELTASONE) 5 MG tablet TAKE 1 TABLET BY MOUTH EVERY MORNING TO REDUCE PAIN  . vitamin C (ASCORBIC ACID) 500 MG tablet Take 500 mg by mouth daily.   No facility-administered encounter medications on file as of 11/11/2018.     Activities of Daily Living In your present state of health, do you have any difficulty performing the following activities: 11/11/2018  Hearing? N  Vision? Y  Comment has appointment with Opthalmology in july  Difficulty concentrating or making decisions? Y  Comment memory  Walking or climbing stairs? N  Dressing or bathing? N  Doing errands, shopping? N  Preparing Food and eating ? N  In the past six months, have you accidently leaked urine? Y  Comment at night  Do you have problems with loss of bowel control? N  Managing your Medications? N  Managing your Finances? N  Housekeeping or managing your Housekeeping? N  Some recent data might be hidden    Patient Care Team: Gayland Curry, DO as PCP - General (Geriatric Medicine) Excell Seltzer, MD as Consulting Physician (General Surgery) Tanda Rockers, MD as Consulting Physician (Pulmonary Disease) Ronald Lobo, MD as Consulting Physician (Gastroenterology) Jacolyn Reedy, MD as Consulting Physician (Cardiology)     Assessment:   This is a routine wellness examination for  [name].  Exercise Activities and Dietary recommendations Current Exercise Habits: The patient does not participate in regular exercise at present, Exercise limited by: None identified  Goals    . Decrease Stress Level     Patient will try to decrease stress level with yoga and having some time for herself.       Fall Risk Fall Risk  11/11/2018 06/02/2018 04/11/2018 11/23/2017 11/09/2017  Falls in the past year? 0 0 1 No Yes  Number falls in past yr: 0 0 0 - 2 or more  Injury with Fall? 0 0 0 - No  Comment - - - - -   Is the patient's home free of loose throw rugs in walkways, pet beds, electrical cords, etc?   yes      Grab bars in the bathroom? no      Handrails on the stairs?   yes      Adequate lighting?   yes  Depression Screen PHQ 2/9 Scores 11/11/2018 06/02/2018 04/11/2018 11/09/2017  PHQ - 2 Score 0 0 0 1  PHQ- 9 Score - - - -     Cognitive Function MMSE - Mini Mental State Exam 11/09/2017 11/04/2016  Orientation to time 5 5  Orientation to Place 5 5  Registration 3 3  Attention/ Calculation 5 5  Recall 3 2  Language- name 2 objects 2 2  Language- repeat 1 1  Language- follow 3 step command 3 3  Language- read & follow direction 1 1  Write a sentence 1 1  Copy design 1 1  Total score 30 29     6CIT Screen 11/11/2018  What Year? 0 points  What month? 0 points  What time? 0 points  Count back from 20 0 points  Months in reverse 0 points  Repeat phrase 0 points  Total Score 0    Immunization History  Administered Date(s) Administered  . DTaP 11/12/2011  . Influenza Split 02/13/2009, 02/28/2014  . Influenza, High Dose Seasonal PF 03/17/2017, 02/10/2018  . Influenza,inj,Quad PF,6+ Mos 05/01/2015, 03/04/2016  . Influenza-Unspecified 02/22/2013  . Pneumococcal Conjugate-13 06/07/2013  . Pneumococcal Polysaccharide-23 11/04/2016  . Tdap 11/12/2011  . Zoster Recombinat (Shingrix) 12/17/2017    Qualifies  for Shingles Vaccine? Up to date  Screening Tests Health Maintenance  Topic Date Due  . HEMOGLOBIN A1C  10/05/2018  . INFLUENZA VACCINE  12/31/2018  . FOOT EXAM  04/12/2019  . OPHTHALMOLOGY EXAM  05/06/2019  . TETANUS/TDAP  11/11/2021  . DEXA SCAN  Completed  . PNA vac Low Risk Adult  Completed    Cancer Screenings: Lung: Low Dose CT Chest recommended if Age 31-80 years, 30 pack-year currently smoking OR have quit w/in 15years. Patient does not qualify. Breast:  Up to date on Mammogram? N/A   Up to date of Bone Density/Dexa? Yes Colorectal: N/A   Additional Screenings:  Hepatitis C Screening: Low Risk      Plan:  -   I have personally reviewed and noted the following in the patient's chart:   . Medical and social history . Use of alcohol, tobacco or illicit drugs  . Current medications and supplements . Functional ability and status . Nutritional status . Physical activity . Advanced directives . List of other physicians . Hospitalizations, surgeries, and ER visits in previous 12 months . Vitals . Screenings to include cognitive, depression, and falls . Referrals and appointments  In addition, I have reviewed and discussed with patient certain preventive protocols, quality metrics, and best  practice recommendations. A written personalized care plan for preventive services as well as general preventive health recommendations were provided to patient.   Sandrea Hughs, NP  11/11/2018  - Annual eye exam with Opthalmology and Foot exam with Podiatrist once COVID-19 restrictions are over.

## 2018-11-15 ENCOUNTER — Other Ambulatory Visit: Payer: Self-pay

## 2018-11-21 ENCOUNTER — Encounter: Payer: Self-pay | Admitting: Internal Medicine

## 2018-11-21 ENCOUNTER — Ambulatory Visit (INDEPENDENT_AMBULATORY_CARE_PROVIDER_SITE_OTHER): Payer: Medicare Other | Admitting: Internal Medicine

## 2018-11-21 ENCOUNTER — Other Ambulatory Visit: Payer: Self-pay

## 2018-11-21 DIAGNOSIS — E113513 Type 2 diabetes mellitus with proliferative diabetic retinopathy with macular edema, bilateral: Secondary | ICD-10-CM

## 2018-11-21 DIAGNOSIS — E0859 Diabetes mellitus due to underlying condition with other circulatory complications: Secondary | ICD-10-CM

## 2018-11-21 DIAGNOSIS — F3342 Major depressive disorder, recurrent, in full remission: Secondary | ICD-10-CM

## 2018-11-21 DIAGNOSIS — I10 Essential (primary) hypertension: Secondary | ICD-10-CM | POA: Diagnosis not present

## 2018-11-21 DIAGNOSIS — Z794 Long term (current) use of insulin: Secondary | ICD-10-CM | POA: Diagnosis not present

## 2018-11-21 DIAGNOSIS — E039 Hypothyroidism, unspecified: Secondary | ICD-10-CM

## 2018-11-21 DIAGNOSIS — I25119 Atherosclerotic heart disease of native coronary artery with unspecified angina pectoris: Secondary | ICD-10-CM | POA: Diagnosis not present

## 2018-11-21 DIAGNOSIS — K224 Dyskinesia of esophagus: Secondary | ICD-10-CM

## 2018-11-21 MED ORDER — DEXCOM G6 TRANSMITTER MISC: 1.0000 | Freq: Every day | 0 refills | Status: DC

## 2018-11-21 MED ORDER — DEXCOM G6 RECEIVER DEVI: 1.0000 | Freq: Every day | 0 refills | Status: DC

## 2018-11-21 MED ORDER — DEXCOM G6 SENSOR MISC: 1.0000 | Freq: Every day | 0 refills | Status: DC

## 2018-11-21 NOTE — Progress Notes (Signed)
Patient ID: [name], female   DOB: [birth year], 78 y.o.   MRN: [id number] This service is provided via telemedicine  No vital signs collected/recorded due to the encounter was a telemedicine visit.   Location of patient (ex: home, work):  HOME  Patient consents to a telephone visit:  YES  Location of the provider (ex: office, home):  OFFICE  Name of any referring provider:  Charlene Detter, DO  Names of all persons participating in the telemedicine service and their role in the encounter: PATIENT, Edwin Dada, [place], [place], DO  Time spent on call:  3:36    Provider:  Annah Jasko L. Mariea Clonts, D.O., C.M.D.  Code Status: DNR Goals of Care:  Advanced Directives 11/11/2018  Does Patient Have a Medical Advance Directive? Yes  Type of Advance Directive -  Does patient want to make changes to medical advance directive? No - Patient declined  Copy of [place] in Chart? -  Would patient like information on creating a medical advance directive? -     Chief Complaint  Patient presents with  . Medical Management of Chronic Issues    5nth follow-up    HPI: Patient is a 78 y.o. female seen today for medical management of chronic diseases.    She requests the G6 monitor for her diabetes--says she looked into medicare coverage and it is covered now.  She wants a 3 month supply [facility].    Meds are the same.    A couple of episodes of chest pain that seemed like indigestion.  Has not used ntg since months ago--did give her relief.  She thinks she has esophageal spasms--will drink something and it will go away.    No low sugars but one time when she did not eat.  Keeps halo orange by her bed if needed.    No falls.  Covid-19 has worn her out.  Has not gone out except 1x per 8-14 days for drive thru for meds, post office and grocery store pickup.  Not out at all for 8 weeks before that.    Uses meclizine for vertigo as needed once in a while--just one  time she   Asked about depression--tv, refrigerator and car all went out in one week at a point--she took care of all of it and what a relief when that was done.    Past Medical History:  Diagnosis Date  . Abdominal pain, unspecified site   . Abnormality of gait   . Apnea   . Cardiomegaly   . Chest pain, unspecified   . Complication of anesthesia    hard time waking up  . Depressive disorder, not elsewhere classified   . Diabetes mellitus   . Diabetic retinopathy ([place])   . Dizziness and giddiness   . Dyskinesia of esophagus   . Edema   . Extrinsic asthma, unspecified   . Female stress incontinence   . Gout, unspecified   . High cholesterol   . Hypertension   . Lipoma of other skin and subcutaneous tissue   . Lumbago   . Memory loss   . Migraine without aura, with intractable migraine, so stated, with status migrainosus   . Mild cognitive impairment, so stated   . Nonspecific (abnormal) findings on radiological and other examination of abdominal area, including retroperitoneum   . Nonspecific abnormal results of liver function study   . Obesity, unspecified   . Obstructive chronic bronchitis with exacerbation ([place])   .  Other and unspecified hyperlipidemia   . Other B-complex deficiencies   . Other nonspecific abnormal serum enzyme levels   . Other specified cardiac dysrhythmias(427.89)   . Other specified disease of sebaceous glands   . Other symptoms involving cardiovascular system   . Pain in joint, ankle and foot   . Pain in joint, pelvic region and thigh   . Pain in joint, shoulder region   . Palpitations   . Reflux esophagitis   . Shortness of breath   . Tension headache   . Type I (juvenile type) diabetes mellitus without mention of complication, not stated as uncontrolled   . Type I (juvenile type) diabetes mellitus without mention of complication, uncontrolled   . Unspecified essential hypertension   . Unspecified hypothyroidism   . Unspecified vitamin D  deficiency   . Ventral hernia, unspecified, without mention of obstruction or gangrene     Past Surgical History:  Procedure Laterality Date  . ABDOMINAL HYSTERECTOMY    . APPENDECTOMY    . BREAST SURGERY    . EXPLORATORY LAPAROTOMY W/ BOWEL RESECTION  07/25/2004   PROCEDURE: Laparoscopy, open laparotomy, resection of jejunojejunostomy  . INCISIONAL HERNIA REPAIR  03/19/2006   PROCEDURE: Open ventral hernia repair with mesh.  Marland Kitchen LAPAROSCOPIC GASTRIC BYPASS  07/22/2004   PROCEDURE: Laparoscopic Roux-en-Y gastric bypass, antecolic, antegastric,  . LAPAROSCOPIC INCISIONAL / UMBILICAL / VENTRAL HERNIA REPAIR  09/22/2005   PROCEDURE: Laparoscopic ventral hernia repair with mesh.  Marland Kitchen LEFT HEART CATH AND CORONARY ANGIOGRAPHY N/A 11/20/2016   Procedure: Left Heart Cath and Coronary Angiography;  Surgeon: Sherren Mocha, MD;  Location: [facility];  Service: Cardiovascular;  Laterality: N/A;  . LEFT HEART CATHETERIZATION WITH CORONARY ANGIOGRAM N/A 08/21/2014   Procedure: LEFT HEART CATHETERIZATION WITH CORONARY ANGIOGRAM;  Surgeon: Sherren Mocha, MD;  Location: [facility] C[facility];  Service: Cardiovascular;  Laterality: N/A;  . stents     last time she said was 1 yr ago     Allergies  Allergen Reactions  . Ciprofloxacin Rash  . Advair Diskus [Fluticasone-Salmeterol] Other (See Comments)    Throat closes  . Albuterol     REACTION: closes throat  . Alendronate Sodium Itching  . Benadryl [Diphenhydramine Hcl] Itching  . Benzonatate     REACTION: rash/hives  . Cephalexin Nausea Only  . Gabapentin Other (See Comments)    Loss of memory  . Iohexol      Desc: rash and DIF BREATHING   . Keflex [Cephalexin] Nausea And Vomiting  . Morphine Nausea And Vomiting  . Other Other (See Comments)    Decongestants- keeps pt awake at night, increased heart rate  . Propoxyphene Hcl     Doesn't recall  . Reclast [Zoledronic Acid] Other (See Comments)    Chest pain  . Avapro [Irbesartan] Rash  .  Codeine Nausea And Vomiting, Swelling and Rash  . Tessalon Perles Rash  . Tramadol Nausea Only and Rash    Outpatient Encounter Medications as of 11/21/2018  Medication Sig  . amLODipine (NORVASC) 2.5 MG tablet TAKE 1 TABLET BY MOUTH EVERY DAY  . aspirin 81 MG tablet Take 81 mg by mouth daily.   Marland Kitchen atorvastatin (LIPITOR) 20 MG tablet TAKE 1 TABLET BY MOUTH EVERY DAY  . Calcium Citrate-Vitamin D (CALCIUM CITRATE + D PO) Take by mouth daily.  . cetirizine (ZYRTEC) 10 MG tablet Take 10 mg by mouth daily as needed for allergies. For allergies  . Cholecalciferol (VITAMIN D3) 5000 UNITS CAPS Take 1  tablet by mouth daily. For supplement  . CINNAMON PO Take 1,000 mg by mouth daily.  . citalopram (CELEXA) 40 MG tablet TAKE 1 TABLET BY MOUTH EVERY DAY FOR DEPRESSION  . Continuous Blood Gluc Sensor (FREESTYLE LIBRE 14 DAY SENSOR) MISC 1 Device by Subdermal route 4 (four) times daily. Check fasting blood sugar and with each meal. Dx: K74.2595  . docusate sodium (COLACE) 100 MG capsule Take 100 mg by mouth 2 (two) times daily.  . ferrous sulfate 325 (65 FE) MG tablet Take 325 mg by mouth 2 (two) times daily with a meal.  . furosemide (LASIX) 20 MG tablet Take 1 tablet (20 mg total) by mouth every other day. Pt needs to call and make appt to continue getting refills. Thanks  . glipiZIDE (GLUCOTROL XL) 10 MG 24 hr tablet TAKE ONE TABLET BY MOUTH IN THE MORNING AND ONE TABLET IN THE EVENING TO CONTROL BLOOD SUGAR  . insulin NPH Human (HUMULIN N) 100 UNIT/ML injection INJECT 40 UNITS IN THE MORNING AND 40 UNITS IN THE EVENING.  . insulin regular (NOVOLIN R,HUMULIN R) 100 units/mL injection Inject 0.05 mLs (5 Units total) into the skin as needed for high blood sugar (blood sugars over 250). Per sliding scale  . Insulin Syringes, Disposable, U-100 1 ML MISC Use to inject insulin Dx: G38.7564  . isosorbide mononitrate (IMDUR) 30 MG 24 hr tablet TAKE 1 TABLET BY MOUTH EVERY DAY  . losartan-hydrochlorothiazide  (HYZAAR) 50-12.5 MG tablet TAKE 1 TABLET BY MOUTH EVERY DAY FOR BLOOD PRESSURE  . Magnesium Oxide 250 MG TABS Take 500 mg by mouth daily.   . meclizine (ANTIVERT) 25 MG tablet Take 1 tablet (25 mg total) by mouth 3 (three) times daily. For dizziness  . nitroGLYCERIN (NITROSTAT) 0.4 MG SL tablet Place 0.4 mg under the tongue every 5 (five) minutes as needed for chest pain.  Marland Kitchen omeprazole (PRILOSEC) 20 MG capsule TAKE 1 CAPSULE BY MOUTH EVERY DAY  . predniSONE (DELTASONE) 5 MG tablet TAKE 1 TABLET BY MOUTH EVERY MORNING TO REDUCE PAIN  . vitamin C (ASCORBIC ACID) 500 MG tablet Take 500 mg by mouth daily.   No facility-administered encounter medications on file as of 11/21/2018.     Review of Systems:  Review of Systems  Constitutional: Positive for malaise/fatigue. Negative for chills and fever.  Eyes: Negative for blurred vision.  Respiratory: Negative for shortness of breath.   Cardiovascular: Positive for chest pain. Negative for palpitations and leg swelling.  Gastrointestinal: Positive for heartburn. Negative for abdominal pain.  Genitourinary: Negative for dysuria.  Musculoskeletal: Negative for falls and joint pain.  Skin: Negative for itching and rash.  Neurological: Negative for dizziness and loss of consciousness.  Endo/Heme/Allergies: Bruises/bleeds easily.  Psychiatric/Behavioral: Negative for depression and memory loss. The patient is not nervous/anxious and does not have insomnia.     Health Maintenance  Topic Date Due  . HEMOGLOBIN A1C  10/05/2018  . INFLUENZA VACCINE  12/31/2018  . FOOT EXAM  04/12/2019  . OPHTHALMOLOGY EXAM  05/06/2019  . TETANUS/TDAP  11/11/2021  . DEXA SCAN  Completed  . PNA vac Low Risk Adult  Completed    Physical Exam: Could not be performed as visit non face-to-face via phone   Labs reviewed: Basic Metabolic Panel: Recent Labs    04/06/18 0845  NA 143  K 4.2  CL 104  CO2 31  GLUCOSE 94  BUN 16  CREATININE 0.81  CALCIUM 8.8    Liver Function Tests: Recent Labs  04/06/18 0845  AST 14  ALT 18  BILITOT 0.6  PROT 5.9*   No results for input(s): LIPASE, AMYLASE in the last 8760 hours. No results for input(s): AMMONIA in the last 8760 hours. CBC: Recent Labs    04/06/18 0845  WBC 8.7  NEUTROABS 5,542  HGB 12.9  HCT 39.8  MCV 87.7  PLT 353   Lipid Panel: Recent Labs    04/06/18 0845  CHOL 171  HDL 42*  LDLCALC 98  TRIG 213*  CHOLHDL 4.1   Lab Results  Component Value Date   HGBA1C 8.0 (H) 04/06/2018    Procedures since last visit: No results found.  Assessment/Plan 1. Type 2 diabetes mellitus with both eyes affected by proliferative retinopathy and macular edema, with long-term current use of insulin ([place]) E11.319 -pt requests Dexcon G6 testing device due to difficulties with lows, longstanding difficult to manage diabetes--[facility] [place] -should qualify for continuous glucose monitoring with her heart disease, difficulty to regulate sugars and struggles checking her sugars with fingersticks--codes put on prescriptions and sent [facility]  2. Hypothyroidism, unspecified type -euthyroid, f/u labs before next visit in nov Lab Results  Component Value Date   TSH 3.68 11/04/2016    3. Esophageal spasm -improved with drinking fluids when occurs and causes chest pain  4. Essential hypertension, benign -bp not checked at home, will f/u when she comes in  5. Coronary artery disease involving native coronary artery of native heart with angina pectoris ([place]) - has prn ntg for chest pain--has taken but says her chest pain actually got better from drinking something so she thinks it was esophageal spasms  6. Depression, major,  ([place]) remission -in remission now  Labs/tests ordered:  Cbc, cmp, flp, hba1c, tsh before Next appt:  5 mos med mgt Non face-to-face time spent on televisit:  27 minutes  Nhi Butrum L. Darran Gabay, D.O. [facility] [address], [place] [address] Cell Phone (Mon-Fri 8am-5pm):  [phone] On Call:  [phone] & follow prompts after 5pm & weekends Office Phone:  [phone] Office Fax:  [phone]

## 2018-11-28 ENCOUNTER — Other Ambulatory Visit: Payer: Self-pay | Admitting: Internal Medicine

## 2018-11-28 DIAGNOSIS — I25119 Atherosclerotic heart disease of native coronary artery with unspecified angina pectoris: Secondary | ICD-10-CM

## 2018-11-28 DIAGNOSIS — Z794 Long term (current) use of insulin: Secondary | ICD-10-CM

## 2018-11-28 DIAGNOSIS — E113513 Type 2 diabetes mellitus with proliferative diabetic retinopathy with macular edema, bilateral: Secondary | ICD-10-CM

## 2018-11-28 DIAGNOSIS — E0859 Diabetes mellitus due to underlying condition with other circulatory complications: Secondary | ICD-10-CM

## 2018-11-28 DIAGNOSIS — I10 Essential (primary) hypertension: Secondary | ICD-10-CM

## 2018-12-22 DIAGNOSIS — H26491 Other secondary cataract, right eye: Secondary | ICD-10-CM | POA: Diagnosis not present

## 2018-12-22 DIAGNOSIS — H35373 Puckering of macula, bilateral: Secondary | ICD-10-CM | POA: Diagnosis not present

## 2018-12-22 DIAGNOSIS — H26493 Other secondary cataract, bilateral: Secondary | ICD-10-CM | POA: Diagnosis not present

## 2018-12-22 DIAGNOSIS — H18413 Arcus senilis, bilateral: Secondary | ICD-10-CM | POA: Diagnosis not present

## 2018-12-22 DIAGNOSIS — H40053 Ocular hypertension, bilateral: Secondary | ICD-10-CM | POA: Diagnosis not present

## 2018-12-22 DIAGNOSIS — Z961 Presence of intraocular lens: Secondary | ICD-10-CM | POA: Diagnosis not present

## 2018-12-26 ENCOUNTER — Other Ambulatory Visit: Payer: Self-pay | Admitting: Cardiovascular Disease

## 2018-12-29 ENCOUNTER — Other Ambulatory Visit: Payer: Self-pay | Admitting: Nurse Practitioner

## 2019-01-13 ENCOUNTER — Other Ambulatory Visit: Payer: Self-pay | Admitting: Internal Medicine

## 2019-01-26 DIAGNOSIS — H40053 Ocular hypertension, bilateral: Secondary | ICD-10-CM | POA: Diagnosis not present

## 2019-02-01 DIAGNOSIS — H43811 Vitreous degeneration, right eye: Secondary | ICD-10-CM | POA: Diagnosis not present

## 2019-02-01 DIAGNOSIS — H35371 Puckering of macula, right eye: Secondary | ICD-10-CM | POA: Diagnosis not present

## 2019-02-01 DIAGNOSIS — H35372 Puckering of macula, left eye: Secondary | ICD-10-CM | POA: Diagnosis not present

## 2019-02-01 DIAGNOSIS — E113491 Type 2 diabetes mellitus with severe nonproliferative diabetic retinopathy without macular edema, right eye: Secondary | ICD-10-CM | POA: Diagnosis not present

## 2019-02-09 DIAGNOSIS — D2371 Other benign neoplasm of skin of right lower limb, including hip: Secondary | ICD-10-CM | POA: Diagnosis not present

## 2019-02-09 DIAGNOSIS — L814 Other melanin hyperpigmentation: Secondary | ICD-10-CM | POA: Diagnosis not present

## 2019-02-09 DIAGNOSIS — L565 Disseminated superficial actinic porokeratosis (DSAP): Secondary | ICD-10-CM | POA: Diagnosis not present

## 2019-02-09 DIAGNOSIS — D225 Melanocytic nevi of trunk: Secondary | ICD-10-CM | POA: Diagnosis not present

## 2019-02-09 DIAGNOSIS — L820 Inflamed seborrheic keratosis: Secondary | ICD-10-CM | POA: Diagnosis not present

## 2019-02-09 DIAGNOSIS — L918 Other hypertrophic disorders of the skin: Secondary | ICD-10-CM | POA: Diagnosis not present

## 2019-02-09 DIAGNOSIS — D692 Other nonthrombocytopenic purpura: Secondary | ICD-10-CM | POA: Diagnosis not present

## 2019-02-09 DIAGNOSIS — D1801 Hemangioma of skin and subcutaneous tissue: Secondary | ICD-10-CM | POA: Diagnosis not present

## 2019-02-09 DIAGNOSIS — L821 Other seborrheic keratosis: Secondary | ICD-10-CM | POA: Diagnosis not present

## 2019-02-10 ENCOUNTER — Other Ambulatory Visit: Payer: Self-pay | Admitting: Internal Medicine

## 2019-02-15 ENCOUNTER — Other Ambulatory Visit: Payer: Self-pay | Admitting: Nurse Practitioner

## 2019-02-15 DIAGNOSIS — G8929 Other chronic pain: Secondary | ICD-10-CM

## 2019-02-15 NOTE — Telephone Encounter (Signed)
Dr.Reed please advise if ok to refill

## 2019-02-23 ENCOUNTER — Other Ambulatory Visit: Payer: Self-pay | Admitting: Nurse Practitioner

## 2019-02-26 ENCOUNTER — Other Ambulatory Visit: Payer: Self-pay | Admitting: Internal Medicine

## 2019-02-26 DIAGNOSIS — E1165 Type 2 diabetes mellitus with hyperglycemia: Secondary | ICD-10-CM

## 2019-03-08 ENCOUNTER — Other Ambulatory Visit: Payer: Self-pay | Admitting: Internal Medicine

## 2019-03-08 ENCOUNTER — Other Ambulatory Visit: Payer: Self-pay | Admitting: Nurse Practitioner

## 2019-03-08 DIAGNOSIS — K219 Gastro-esophageal reflux disease without esophagitis: Secondary | ICD-10-CM

## 2019-03-22 ENCOUNTER — Telehealth: Payer: Self-pay | Admitting: *Deleted

## 2019-03-22 NOTE — Telephone Encounter (Signed)
Received from from [employer] Requesting Detailed Written Order for [employer] 14 day Reader and [employer] 14 day Sensors.  Placed in Dr. Serafina Mitchell to review and sign.

## 2019-03-23 ENCOUNTER — Other Ambulatory Visit: Payer: Self-pay | Admitting: Cardiovascular Disease

## 2019-04-15 ENCOUNTER — Other Ambulatory Visit: Payer: Self-pay | Admitting: Nurse Practitioner

## 2019-04-20 ENCOUNTER — Other Ambulatory Visit: Payer: Medicare Other

## 2019-04-20 ENCOUNTER — Other Ambulatory Visit: Payer: Self-pay

## 2019-04-20 DIAGNOSIS — E1165 Type 2 diabetes mellitus with hyperglycemia: Secondary | ICD-10-CM | POA: Diagnosis not present

## 2019-04-20 DIAGNOSIS — I10 Essential (primary) hypertension: Secondary | ICD-10-CM | POA: Diagnosis not present

## 2019-04-21 LAB — CBC WITH DIFFERENTIAL/PLATELET
Absolute Monocytes: 481 cells/uL (ref 200–950)
Basophils Absolute: 83 cells/uL (ref 0–200)
Basophils Relative: 1 %
Eosinophils Absolute: 158 cells/uL (ref 15–500)
Eosinophils Relative: 1.9 %
HCT: 40.1 % (ref 35.0–45.0)
Hemoglobin: 13.1 g/dL (ref 11.7–15.5)
Lymphs Abs: 2166 cells/uL (ref 850–3900)
MCH: 30 pg (ref 27.0–33.0)
MCHC: 32.7 g/dL (ref 32.0–36.0)
MCV: 92 fL (ref 80.0–100.0)
MPV: 12.1 fL (ref 7.5–12.5)
Monocytes Relative: 5.8 %
Neutro Abs: 5412 cells/uL (ref 1500–7800)
Neutrophils Relative %: 65.2 %
Platelets: 330 10*3/uL (ref 140–400)
RBC: 4.36 10*6/uL (ref 3.80–5.10)
RDW: 12.9 % (ref 11.0–15.0)
Total Lymphocyte: 26.1 %
WBC: 8.3 10*3/uL (ref 3.8–10.8)

## 2019-04-21 LAB — BASIC METABOLIC PANEL
BUN/Creatinine Ratio: 21 (calc) (ref 6–22)
BUN: 19 mg/dL (ref 7–25)
CO2: 25 mmol/L (ref 20–32)
Calcium: 9.2 mg/dL (ref 8.6–10.4)
Chloride: 105 mmol/L (ref 98–110)
Creat: 0.89 mg/dL — ABNORMAL HIGH (ref 0.60–0.88)
Glucose, Bld: 77 mg/dL (ref 65–99)
Potassium: 3.6 mmol/L (ref 3.5–5.3)
Sodium: 143 mmol/L (ref 135–146)

## 2019-04-21 LAB — LIPID PANEL
Cholesterol: 140 mg/dL (ref <200)
HDL: 45 mg/dL — ABNORMAL LOW (ref >=50)
LDL Cholesterol (Calc): 69 mg/dL (calc)
Non-HDL Cholesterol (Calc): 95 mg/dL (calc) (ref <130)
Total CHOL/HDL Ratio: 3.1 (calc) (ref <5.0)
Triglycerides: 191 mg/dL — ABNORMAL HIGH (ref <150)

## 2019-04-21 LAB — HEMOGLOBIN A1C
Hgb A1c MFr Bld: 7.1 % of total Hgb — ABNORMAL HIGH (ref <5.7)
Mean Plasma Glucose: 157 (calc)
eAG (mmol/L): 8.7 (calc)

## 2019-04-24 ENCOUNTER — Ambulatory Visit (INDEPENDENT_AMBULATORY_CARE_PROVIDER_SITE_OTHER): Payer: Medicare Other | Admitting: Internal Medicine

## 2019-04-24 ENCOUNTER — Encounter: Payer: Self-pay | Admitting: Internal Medicine

## 2019-04-24 ENCOUNTER — Other Ambulatory Visit: Payer: Self-pay

## 2019-04-24 VITALS — BP 140/70 | HR 90 | Temp 97.9°F | Ht 62.0 in | Wt 210.0 lb

## 2019-04-24 DIAGNOSIS — Z9884 Bariatric surgery status: Secondary | ICD-10-CM | POA: Diagnosis not present

## 2019-04-24 DIAGNOSIS — I25119 Atherosclerotic heart disease of native coronary artery with unspecified angina pectoris: Secondary | ICD-10-CM

## 2019-04-24 DIAGNOSIS — I10 Essential (primary) hypertension: Secondary | ICD-10-CM | POA: Diagnosis not present

## 2019-04-24 DIAGNOSIS — Z6838 Body mass index (BMI) 38.0-38.9, adult: Secondary | ICD-10-CM

## 2019-04-24 DIAGNOSIS — E113513 Type 2 diabetes mellitus with proliferative diabetic retinopathy with macular edema, bilateral: Secondary | ICD-10-CM | POA: Diagnosis not present

## 2019-04-24 DIAGNOSIS — E039 Hypothyroidism, unspecified: Secondary | ICD-10-CM | POA: Diagnosis not present

## 2019-04-24 DIAGNOSIS — K224 Dyskinesia of esophagus: Secondary | ICD-10-CM

## 2019-04-24 DIAGNOSIS — H6123 Impacted cerumen, bilateral: Secondary | ICD-10-CM

## 2019-04-24 DIAGNOSIS — Z794 Long term (current) use of insulin: Secondary | ICD-10-CM

## 2019-04-24 DIAGNOSIS — R296 Repeated falls: Secondary | ICD-10-CM | POA: Diagnosis not present

## 2019-04-24 NOTE — Progress Notes (Signed)
Location:  [facility] clinic  Provider: Dr. Hollace Kinnier  Code Status: DNR Goals of Care:  Advanced Directives 11/11/2018  Does Patient Have a Medical Advance Directive? Yes  Type of Advance Directive -  Does patient want to make changes to medical advance directive? No - Patient declined  Copy of [place] in Chart? -  Would patient like information on creating a medical advance directive? -     Chief Complaint  Patient presents with  . Medical Management of Chronic Issues    78mth follow-up    HPI: Patient is a 78 y.o. female seen today for medical management of chronic diseases.    Lab results reviewed with patient.   She eats what she can because she is not going out. These food are canned and high in sodium. A few times a week she will eat a salad. Eats white castle burgers for the protein. Orders her groceries online through [place] and drives to pick them up.   Right ankle arthritis is still a issue. Wears her brace daily. The brace reduces pain. Ambulates with a cane. She is interested in in a motorized chair for when she goes out.   She fell two months ago and injured her left knee. She is having to use her car to accomplish certain chores because she cannot walk long distances.  Worried about falling since she lives alone. She does own a life alert.   Her abdomen hurts periodically. She contributes the pain to certain foods she is eating. Then the pain occurs she sometimes throws up. Has a history of gastric bypass. Interested in seeing Dr. Hassell Done.   States she feels wobbly. Often leans back. Denies any dizziness.   [facility] in September 2020.   Has not seen dentist in over a year.   Saw Dr. Zadie Rhine this year, her diabetic eye doctor.   Also had laser procedure to right eye by Dr. Tommy Rainwater recently.          Past Medical History:  Diagnosis Date  . Abdominal pain, unspecified site   . Abnormality of gait   . Apnea   .  Cardiomegaly   . Chest pain, unspecified   . Complication of anesthesia    hard time waking up  . Depressive disorder, not elsewhere classified   . Diabetes mellitus   . Diabetic retinopathy ([place])   . Dizziness and giddiness   . Dyskinesia of esophagus   . Edema   . Extrinsic asthma, unspecified   . Female stress incontinence   . Gout, unspecified   . High cholesterol   . Hypertension   . Lipoma of other skin and subcutaneous tissue   . Lumbago   . Memory loss   . Migraine without aura, with intractable migraine, so stated, with status migrainosus   . Mild cognitive impairment, so stated   . Nonspecific (abnormal) findings on radiological and other examination of abdominal area, including retroperitoneum   . Nonspecific abnormal results of liver function study   . Obesity, unspecified   . Obstructive chronic bronchitis with exacerbation ([place])   . Other and unspecified hyperlipidemia   . Other B-complex deficiencies   . Other nonspecific abnormal serum enzyme levels   . Other specified cardiac dysrhythmias(427.89)   . Other specified disease of sebaceous glands   . Other symptoms involving cardiovascular system   . Pain in joint, ankle and foot   . Pain in joint, pelvic region  and thigh   . Pain in joint, shoulder region   . Palpitations   . Reflux esophagitis   . Shortness of breath   . Tension headache   . Type I (juvenile type) diabetes mellitus without mention of complication, not stated as uncontrolled   . Type I (juvenile type) diabetes mellitus without mention of complication, uncontrolled   . Unspecified essential hypertension   . Unspecified hypothyroidism   . Unspecified vitamin D deficiency   . Ventral hernia, unspecified, without mention of obstruction or gangrene     Past Surgical History:  Procedure Laterality Date  . ABDOMINAL HYSTERECTOMY    . APPENDECTOMY    . BREAST SURGERY    . EXPLORATORY LAPAROTOMY W/ BOWEL RESECTION  07/25/2004   PROCEDURE:  Laparoscopy, open laparotomy, resection of jejunojejunostomy  . INCISIONAL HERNIA REPAIR  03/19/2006   PROCEDURE: Open ventral hernia repair with mesh.  Marland Kitchen LAPAROSCOPIC GASTRIC BYPASS  07/22/2004   PROCEDURE: Laparoscopic Roux-en-Y gastric bypass, antecolic, antegastric,  . LAPAROSCOPIC INCISIONAL / UMBILICAL / VENTRAL HERNIA REPAIR  09/22/2005   PROCEDURE: Laparoscopic ventral hernia repair with mesh.  Marland Kitchen LEFT HEART CATH AND CORONARY ANGIOGRAPHY N/A 11/20/2016   Procedure: Left Heart Cath and Coronary Angiography;  Surgeon: Sherren Mocha, MD;  Location: [facility];  Service: Cardiovascular;  Laterality: N/A;  . LEFT HEART CATHETERIZATION WITH CORONARY ANGIOGRAM N/A 08/21/2014   Procedure: LEFT HEART CATHETERIZATION WITH CORONARY ANGIOGRAM;  Surgeon: Sherren Mocha, MD;  Location: [facility] LAB;  Service: Cardiovascular;  Laterality: N/A;  . stents     last time she said was 1 yr ago     Allergies  Allergen Reactions  . Ciprofloxacin Rash  . Advair Diskus [Fluticasone-Salmeterol] Other (See Comments)    Throat closes  . Albuterol     REACTION: closes throat  . Alendronate Sodium Itching  . Benadryl [Diphenhydramine Hcl] Itching  . Benzonatate     REACTION: rash/hives  . Cephalexin Nausea Only  . Gabapentin Other (See Comments)    Loss of memory  . Iohexol      Desc: rash and DIF BREATHING   . Keflex [Cephalexin] Nausea And Vomiting  . Morphine Nausea And Vomiting  . Other Other (See Comments)    Decongestants- keeps pt awake at night, increased heart rate  . Propoxyphene Hcl     Doesn't recall  . Reclast [Zoledronic Acid] Other (See Comments)    Chest pain  . Avapro [Irbesartan] Rash  . Codeine Nausea And Vomiting, Swelling and Rash  . Tessalon Perles Rash  . Tramadol Nausea Only and Rash    Outpatient Encounter Medications as of 04/24/2019  Medication Sig  . amLODipine (NORVASC) 2.5 MG tablet TAKE 1 TABLET BY MOUTH EVERY DAY  . aspirin 81 MG tablet Take 81 mg by  mouth daily.   Marland Kitchen atorvastatin (LIPITOR) 20 MG tablet TAKE 1 TABLET BY MOUTH EVERY DAY  . Calcium Citrate-Vitamin D (CALCIUM CITRATE + D PO) Take by mouth daily.  . cetirizine (ZYRTEC) 10 MG tablet Take 10 mg by mouth daily as needed for allergies. For allergies  . Cholecalciferol (VITAMIN D3) 5000 UNITS CAPS Take 1 tablet by mouth daily. For supplement  . CINNAMON PO Take 1,000 mg by mouth daily.  . citalopram (CELEXA) 40 MG tablet TAKE 1 TABLET BY MOUTH EVERY DAY FOR DEPRESSION  . Continuous Blood Gluc Receiver (FREESTYLE LIBRE 14 DAY READER) DEVI CHECK BLOOD SUGAR 4 TIMES DAILY (FASTING AND WITH EACH MEAL)  . docusate sodium (COLACE)  100 MG capsule Take 100 mg by mouth 2 (two) times daily.  . ferrous sulfate 325 (65 FE) MG tablet Take 325 mg by mouth 2 (two) times daily with a meal.  . glipiZIDE (GLUCOTROL XL) 10 MG 24 hr tablet TAKE ONE TABLET BY MOUTH IN THE MORNING AND ONE TABLET IN THE EVENING TO CONTROL BLOOD SUGAR  . insulin NPH Human (HUMULIN N) 100 UNIT/ML injection INJECT 40 UNITS IN THE MORNING AND 40 UNITS IN THE EVENING  . Insulin Syringes, Disposable, U-100 1 ML MISC Use to inject insulin Dx: [id number]  . isosorbide mononitrate (IMDUR) 30 MG 24 hr tablet TAKE 1 TABLET BY MOUTH EVERY DAY  . losartan-hydrochlorothiazide (HYZAAR) 50-12.5 MG tablet TAKE 1 TABLET BY MOUTH EVERY DAY FOR BLOOD PRESSURE  . Magnesium Oxide 250 MG TABS Take 500 mg by mouth daily.   . meclizine (ANTIVERT) 25 MG tablet Take 1 tablet (25 mg total) by mouth 3 (three) times daily. For dizziness  . nitroGLYCERIN (NITROSTAT) 0.4 MG SL tablet Place 0.4 mg under the tongue every 5 (five) minutes as needed for chest pain.  Marland Kitchen omeprazole (PRILOSEC) 20 MG capsule TAKE 1 CAPSULE BY MOUTH EVERY DAY  . predniSONE (DELTASONE) 5 MG tablet TAKE 1 TABLET BY MOUTH EVERY MORNING TO REDUCE PAIN  . vitamin C (ASCORBIC ACID) 500 MG tablet Take 500 mg by mouth daily.  . [DISCONTINUED] furosemide (LASIX) 20 MG tablet Take 1 tablet (20  mg total) by mouth every other day. Please make overdue appt with Dr. Burt Knack before anymore refills. 2nd attempt  . [DISCONTINUED] insulin regular (NOVOLIN R,HUMULIN R) 100 units/mL injection Inject 0.05 mLs (5 Units total) into the skin as needed for high blood sugar (blood sugars over 250). Per sliding scale   No facility-administered encounter medications on file as of 04/24/2019.     Review of Systems:  Review of Systems  Constitutional: Positive for activity change and fatigue. Negative for appetite change and fever.  HENT: Positive for dental problem and trouble swallowing. Negative for hearing loss.   Eyes: Negative for photophobia and visual disturbance.  Respiratory: Negative for cough, shortness of breath and wheezing.   Cardiovascular: Negative for chest pain and palpitations.  Gastrointestinal: Negative for abdominal pain, constipation, diarrhea and nausea.       Hernia  Endocrine: Negative for polydipsia, polyphagia and polyuria.  Genitourinary: Negative for dysuria, frequency, hematuria and vaginal bleeding.  Musculoskeletal: Positive for arthralgias, back pain and joint swelling.  Skin: Negative.        Dryness  Neurological: Positive for dizziness. Negative for weakness and headaches.  Psychiatric/Behavioral: Negative for dysphoric mood and sleep disturbance. The patient is not nervous/anxious.   All other systems reviewed and are negative.   Health Maintenance  Topic Date Due  . INFLUENZA VACCINE  12/31/2018  . FOOT EXAM  04/12/2019  . OPHTHALMOLOGY EXAM  05/06/2019  . HEMOGLOBIN A1C  10/18/2019  . TETANUS/TDAP  11/11/2021  . DEXA SCAN  Completed  . PNA vac Low Risk Adult  Completed    Physical Exam: Vitals:   04/24/19 0931  BP: 140/70  Pulse: 90  Temp: 97.9 F (36.6 C)  TempSrc: Oral  SpO2: 97%  Weight: 210 lb (95.3 kg)  Height: 5\' 2"  (1.575 m)   Body mass index is 38.41 kg/m. Physical Exam Vitals signs reviewed.  Constitutional:      General:  She is not in acute distress.    Appearance: Normal appearance.  HENT:     Head:  Normocephalic.     Right Ear: There is no impacted cerumen.     Left Ear: There is no impacted cerumen.     Mouth/Throat:     Mouth: Mucous membranes are dry.     Pharynx: No posterior oropharyngeal erythema.  Cardiovascular:     Rate and Rhythm: Normal rate and regular rhythm.     Pulses:          Dorsalis pedis pulses are 1+ on the right side and 1+ on the left side.     Heart sounds: Normal heart sounds. No murmur.  Pulmonary:     Effort: Pulmonary effort is normal. No respiratory distress.     Breath sounds: Normal breath sounds. No wheezing.  Abdominal:     General: Bowel sounds are normal. There is no distension.     Palpations: Abdomen is soft.     Tenderness: There is no abdominal tenderness.     Hernia: A hernia is present.    Musculoskeletal: Normal range of motion.        General: No swelling or tenderness.     Right lower leg: No edema.     Left lower leg: No edema.  Feet:     Right foot:     Protective Sensation: 10 sites tested. 10 sites sensed.     Skin integrity: Skin integrity normal.     Toenail Condition: Right toenails are normal.     Left foot:     Protective Sensation: 10 sites tested. 10 sites sensed.     Skin integrity: Skin integrity normal.     Toenail Condition: Left toenails are normal.  Skin:    General: Skin is warm and dry.     Capillary Refill: Capillary refill takes less than 2 seconds.  Neurological:     General: No focal deficit present.     Mental Status: She is alert and oriented to person, place, and time. Mental status is at baseline.  Psychiatric:        Mood and Affect: Mood normal.        Behavior: Behavior normal.        Thought Content: Thought content normal.        Judgment: Judgment normal.     Labs reviewed: Basic Metabolic Panel: Recent Labs    04/20/19 0949  NA 143  K 3.6  CL 105  CO2 25  GLUCOSE 77  BUN 19  CREATININE 0.89*   CALCIUM 9.2   Liver Function Tests: No results for input(s): AST, ALT, ALKPHOS, BILITOT, PROT, ALBUMIN in the last 8760 hours. No results for input(s): LIPASE, AMYLASE in the last 8760 hours. No results for input(s): AMMONIA in the last 8760 hours. CBC: Recent Labs    04/20/19 0949  WBC 8.3  NEUTROABS 5,412  HGB 13.1  HCT 40.1  MCV 92.0  PLT 330   Lipid Panel: Recent Labs    04/20/19 0949  CHOL 140  HDL 45*  LDLCALC 69  TRIG 191*  CHOLHDL 3.1   Lab Results  Component Value Date   HGBA1C 7.1 (H) 04/20/2019    Procedures since last visit: No results found.  Assessment/Plan 1. Type 2 diabetes mellitus with both eyes affected by proliferative retinopathy and macular edema, with long-term current use of insulin ([place]) - she still struggles to check her sugars and regulate them  - she continues to eat foods that are high in carbs and sugars - she is 8 pounds heavier from last visit -  hemoglobin A1C improved from last time - continue current medication regimen - hemoglobin A1C- future - diabetic eye examination- future - complete metabolic panel with GFR- future  2. Frequent falls - she is ambulating less, has had one fall within past 2 months  - she is at a high risk for falls - referral for OT - encourage wearing life alert at all times - remove all lose rugs and cords from home  3. Esophageal spasm - patient feels something in her throat - examination unremarkable - suspect possible seasonal allergies - recommend restarting zyrtec for 1-2 weeks - contact PCP if symptoms to not improve , may consider GI referral at that time  4. H/O gastric bypass - she has had some vomiting and abdominal pain occassionally the last 2 months -large hernia located over LUQ - referral to general surgery for further evaluation  5. Coronary artery disease involving native coronary artery of native heart with angina pectoris ([place]) - followed by cardiology - continue current  statin regimen  6. Essential hypertension, benign - bp at goal <150/90 - reduce sodium and processed foods from diet - continue current bp medication regimen - complete metabolic panel with GFR- future - complete blood count with differential/platelets- future  7. Hypothyroidism, unspecified type - euthyroid - TSH- future  8. Body mass index (BMI) of 38.0-38.9 in adult - her poor dietary choices have led to weight gain and increased joint pain with decreased mobility - recommend portion size control and calorie counting  - Reduce foods that are processed or frozen - reduce foods high in sodium, sugar, fats, and carbs  9. Obesity, morbid (more than 100 lbs over ideal weight or BMI> 40)(HCC) - same as above  10. Bilateral impacted cerumen - irrigate ears with warm saline    Labs/tests ordered:  Referral to general surgery and OT- today, complete blood count with differential/platelets, complete metabolic panel with GFR, lipid panel, hemoglobin A1C, TSH- future Next appt:  6 month follow up

## 2019-05-08 ENCOUNTER — Other Ambulatory Visit: Payer: Self-pay | Admitting: Internal Medicine

## 2019-05-08 ENCOUNTER — Other Ambulatory Visit: Payer: Self-pay | Admitting: Nurse Practitioner

## 2019-05-08 NOTE — Telephone Encounter (Signed)
Pending refill due to allergy / contraindication alert warning. Routing to provider for approval

## 2019-05-10 ENCOUNTER — Telehealth: Payer: Self-pay

## 2019-05-10 NOTE — Telephone Encounter (Signed)
Patient called requesting her A1C, states her eye doctor was in need of the number also.   Called patient back and provided results. She denied further questions.

## 2019-05-12 ENCOUNTER — Other Ambulatory Visit: Payer: Self-pay | Admitting: Nurse Practitioner

## 2019-05-29 ENCOUNTER — Encounter: Payer: Self-pay | Admitting: Internal Medicine

## 2019-06-16 NOTE — Addendum Note (Signed)
Addended by: Hollace Kinnier L on: 06/16/2019 05:00 PM   Modules accepted: Orders

## 2019-06-30 ENCOUNTER — Other Ambulatory Visit: Payer: Self-pay | Admitting: Nurse Practitioner

## 2019-07-07 DIAGNOSIS — K432 Incisional hernia without obstruction or gangrene: Secondary | ICD-10-CM | POA: Diagnosis not present

## 2019-07-25 DIAGNOSIS — H16223 Keratoconjunctivitis sicca, not specified as Sjogren's, bilateral: Secondary | ICD-10-CM | POA: Diagnosis not present

## 2019-07-25 DIAGNOSIS — Z961 Presence of intraocular lens: Secondary | ICD-10-CM | POA: Diagnosis not present

## 2019-07-25 DIAGNOSIS — H18413 Arcus senilis, bilateral: Secondary | ICD-10-CM | POA: Diagnosis not present

## 2019-07-25 DIAGNOSIS — H40053 Ocular hypertension, bilateral: Secondary | ICD-10-CM | POA: Diagnosis not present

## 2019-07-30 ENCOUNTER — Other Ambulatory Visit: Payer: Self-pay | Admitting: Nurse Practitioner

## 2019-08-24 ENCOUNTER — Other Ambulatory Visit: Payer: Self-pay | Admitting: Nurse Practitioner

## 2019-08-31 ENCOUNTER — Other Ambulatory Visit: Payer: Self-pay | Admitting: Internal Medicine

## 2019-08-31 DIAGNOSIS — K219 Gastro-esophageal reflux disease without esophagitis: Secondary | ICD-10-CM

## 2019-09-06 ENCOUNTER — Encounter (INDEPENDENT_AMBULATORY_CARE_PROVIDER_SITE_OTHER): Payer: Medicare Other | Admitting: Ophthalmology

## 2019-09-14 ENCOUNTER — Telehealth: Payer: Self-pay | Admitting: Cardiovascular Disease

## 2019-09-14 MED ORDER — ISOSORBIDE MONONITRATE ER 30 MG PO TB24: 30.0000 mg | ORAL_TABLET | Freq: Every day | ORAL | 0 refills | Status: DC

## 2019-09-14 NOTE — Telephone Encounter (Signed)
Pt's medication was sent to pt's pharmacy as requested. Confirmation received.  °

## 2019-09-14 NOTE — Telephone Encounter (Signed)
*  STAT* If patient is at the pharmacy, call can be transferred to refill team.   1. Which medications need to be refilled? (please list name of each medication and dose if known) isosorbide mononitrate (IMDUR) 30 MG 24 hr tablet  2. Which pharmacy/location (including street and city if local pharmacy) is medication to be sent to? [id number] [place] [place]  3. Do they need a 30 day or 90 day supply? 90 day supply   Patient states she has about 1 week worth of medication remaining. Patient has scheduled a VT appointment for tomorrow, 09/15/19 at 9:45 AM with Vin Bhagat.

## 2019-09-15 ENCOUNTER — Telehealth: Payer: Self-pay

## 2019-09-15 ENCOUNTER — Encounter: Payer: Self-pay | Admitting: Physician Assistant

## 2019-09-15 ENCOUNTER — Telehealth: Payer: Self-pay | Admitting: *Deleted

## 2019-09-15 ENCOUNTER — Other Ambulatory Visit: Payer: Self-pay

## 2019-09-15 ENCOUNTER — Telehealth (INDEPENDENT_AMBULATORY_CARE_PROVIDER_SITE_OTHER): Payer: Medicare Other | Admitting: Physician Assistant

## 2019-09-15 VITALS — Ht 62.0 in | Wt 200.0 lb

## 2019-09-15 DIAGNOSIS — E782 Mixed hyperlipidemia: Secondary | ICD-10-CM

## 2019-09-15 DIAGNOSIS — E785 Hyperlipidemia, unspecified: Secondary | ICD-10-CM | POA: Diagnosis not present

## 2019-09-15 DIAGNOSIS — Z7982 Long term (current) use of aspirin: Secondary | ICD-10-CM

## 2019-09-15 DIAGNOSIS — I2510 Atherosclerotic heart disease of native coronary artery without angina pectoris: Secondary | ICD-10-CM

## 2019-09-15 DIAGNOSIS — I5181 Takotsubo syndrome: Secondary | ICD-10-CM

## 2019-09-15 DIAGNOSIS — E119 Type 2 diabetes mellitus without complications: Secondary | ICD-10-CM | POA: Diagnosis not present

## 2019-09-15 DIAGNOSIS — I10 Essential (primary) hypertension: Secondary | ICD-10-CM

## 2019-09-15 MED ORDER — ISOSORBIDE MONONITRATE ER 30 MG PO TB24: 30.0000 mg | ORAL_TABLET | Freq: Every day | ORAL | 3 refills | Status: DC

## 2019-09-15 MED ORDER — ATORVASTATIN CALCIUM 20 MG PO TABS: 20.0000 mg | ORAL_TABLET | Freq: Every day | ORAL | 3 refills | Status: DC

## 2019-09-15 MED ORDER — LOSARTAN POTASSIUM-HCTZ 50-12.5 MG PO TABS: ORAL_TABLET | ORAL | 3 refills | Status: DC

## 2019-09-15 NOTE — Addendum Note (Signed)
Addended by: Gaetano Net on: 09/15/2019 01:53 PM   Modules accepted: Orders

## 2019-09-15 NOTE — Progress Notes (Signed)
Virtual Visit via Telephone Note   This visit type was conducted due to national recommendations for restrictions regarding the COVID-19 Pandemic (e.g. social distancing) in an effort to limit this patient's exposure and mitigate transmission in our community.  Due to her co-morbid illnesses, this patient is at least at moderate risk for complications without adequate follow up.  This format is felt to be most appropriate for this patient at this time.  The patient did not have access to video technology/had technical difficulties with video requiring transitioning to audio format only (telephone).  All issues noted in this document were discussed and addressed.  No physical exam could be performed with this format.  Please refer to the patient's chart for her  consent to telehealth for [facility].   The patient was identified using 2 identifiers.  Date:  09/15/2019   ID:  [name], DOB Dec 11, 1940, MRN [id number]  Patient Location: Home Provider Location: Office  PCP:  Gayland Curry, DO  Cardiologist:  Sherren Mocha, MD   Evaluation Performed:  Follow-Up Visit  Chief Complaint:  Yearly follow up for CAD  History of Present Illness:    [name] is a 79 y.o. female with hx of mild non obstructive CAD, DM, HTN and HLD seen for follow up.   The patient has a history of Takotsubo syndrome in 2016 with cardiac catheterization demonstrating patent coronary arteries and atypical periapical wall motion abnormality.  She presented with recurrent chest pain and shortness of breath in June 2018. Her heart catheterization demonstrated nonobstructive CAD and moderately elevated LVEDP.  The echo demonstrated normal LV function and no significant valvular disease.  She was doing well when last seen by Dr. Burt Knack 08/2017.  Seen today for follow up. Does not want to get COVID vaccine. No cardiac symptoms. The patient denies nausea, vomiting, fever, chest pain, palpitations, shortness of  breath, orthopnea, PND, dizziness, syncope, cough, congestion, abdominal pain, hematochezia, melena, lower extremity edema.  The patient does not have symptoms concerning for COVID-19 infection (fever, chills, cough, or new shortness of breath).   Past Medical History:  Diagnosis Date  . Abdominal pain, unspecified site   . Abnormality of gait   . Apnea   . Cardiomegaly   . Chest pain, unspecified   . Complication of anesthesia    hard time waking up  . Depressive disorder, not elsewhere classified   . Diabetes mellitus   . Diabetic retinopathy ([place])   . Dizziness and giddiness   . Dyskinesia of esophagus   . Edema   . Extrinsic asthma, unspecified   . Female stress incontinence   . Gout, unspecified   . High cholesterol   . Hypertension   . Lipoma of other skin and subcutaneous tissue   . Lumbago   . Memory loss   . Migraine without aura, with intractable migraine, so stated, with status migrainosus   . Mild cognitive impairment, so stated   . Nonspecific (abnormal) findings on radiological and other examination of abdominal area, including retroperitoneum   . Nonspecific abnormal results of liver function study   . Obesity, unspecified   . Obstructive chronic bronchitis with exacerbation ([place])   . Other and unspecified hyperlipidemia   . Other B-complex deficiencies   . Other nonspecific abnormal serum enzyme levels   . Other specified cardiac dysrhythmias(427.89)   . Other specified disease of sebaceous glands   . Other symptoms involving cardiovascular system   . Pain in joint, ankle and  foot   . Pain in joint, pelvic region and thigh   . Pain in joint, shoulder region   . Palpitations   . Reflux esophagitis   . Shortness of breath   . Tension headache   . Type I (juvenile type) diabetes mellitus without mention of complication, not stated as uncontrolled   . Type I (juvenile type) diabetes mellitus without mention of complication, uncontrolled   . Unspecified  essential hypertension   . Unspecified hypothyroidism   . Unspecified vitamin D deficiency   . Ventral hernia, unspecified, without mention of obstruction or gangrene    Past Surgical History:  Procedure Laterality Date  . ABDOMINAL HYSTERECTOMY    . APPENDECTOMY    . BREAST SURGERY    . EXPLORATORY LAPAROTOMY W/ BOWEL RESECTION  07/25/2004   PROCEDURE: Laparoscopy, open laparotomy, resection of jejunojejunostomy  . INCISIONAL HERNIA REPAIR  03/19/2006   PROCEDURE: Open ventral hernia repair with mesh.  Marland Kitchen LAPAROSCOPIC GASTRIC BYPASS  07/22/2004   PROCEDURE: Laparoscopic Roux-en-Y gastric bypass, antecolic, antegastric,  . LAPAROSCOPIC INCISIONAL / UMBILICAL / VENTRAL HERNIA REPAIR  09/22/2005   PROCEDURE: Laparoscopic ventral hernia repair with mesh.  Marland Kitchen LEFT HEART CATH AND CORONARY ANGIOGRAPHY N/A 11/20/2016   Procedure: Left Heart Cath and Coronary Angiography;  Surgeon: Sherren Mocha, MD;  Location: [facility];  Service: Cardiovascular;  Laterality: N/A;  . LEFT HEART CATHETERIZATION WITH CORONARY ANGIOGRAM N/A 08/21/2014   Procedure: LEFT HEART CATHETERIZATION WITH CORONARY ANGIOGRAM;  Surgeon: Sherren Mocha, MD;  Location: [facility] CATH LAB;  Service: Cardiovascular;  Laterality: N/A;  . stents     last time she said was 1 yr ago      Current Meds  Medication Sig  . amLODipine (NORVASC) 2.5 MG tablet TAKE 1 TABLET BY MOUTH EVERY DAY  . aspirin 81 MG tablet Take 81 mg by mouth daily.   Marland Kitchen atorvastatin (LIPITOR) 20 MG tablet TAKE 1 TABLET BY MOUTH EVERY DAY  . Calcium Citrate-Vitamin D (CALCIUM CITRATE + D PO) Take by mouth daily.  . cetirizine (ZYRTEC) 10 MG tablet Take 10 mg by mouth daily as needed for allergies. For allergies  . Cholecalciferol (VITAMIN D3) 5000 UNITS CAPS Take 1 tablet by mouth daily. For supplement  . CINNAMON PO Take 1,000 mg by mouth daily.  . citalopram (CELEXA) 40 MG tablet TAKE 1 TABLET BY MOUTH EVERY DAY FOR DEPRESSION  . Continuous Blood Gluc  Receiver (FREESTYLE LIBRE 14 DAY READER) DEVI CHECK BLOOD SUGAR 4 TIMES DAILY (FASTING AND WITH EACH MEAL)  . docusate sodium (COLACE) 100 MG capsule Take 100 mg by mouth 2 (two) times daily.  . ferrous sulfate 325 (65 FE) MG tablet Take 325 mg by mouth 2 (two) times daily with a meal.  . glipiZIDE (GLUCOTROL XL) 10 MG 24 hr tablet TAKE ONE TABLET BY MOUTH IN THE MORNING AND ONE TABLET IN THE EVENING TO CONTROL BLOOD SUGAR  . insulin NPH Human (HUMULIN N) 100 UNIT/ML injection INJECT 40 UNITS IN THE MORNING AND 40 UNITS IN THE EVENING  . Insulin Syringes, Disposable, U-100 1 ML MISC Use to inject insulin Dx: [id number]  . isosorbide mononitrate (IMDUR) 30 MG 24 hr tablet Take 1 tablet (30 mg total) by mouth daily. Please keep upcoming appt in April before anymore refills. Final Attempt  . losartan-hydrochlorothiazide (HYZAAR) 50-12.5 MG tablet TAKE 1 TABLET BY MOUTH EVERY DAY FOR BLOOD PRESSURE  . Magnesium Oxide 250 MG TABS Take 500 mg by mouth daily.   Marland Kitchen  meclizine (ANTIVERT) 25 MG tablet Take 1 tablet (25 mg total) by mouth 3 (three) times daily. For dizziness  . nitroGLYCERIN (NITROSTAT) 0.4 MG SL tablet Place 0.4 mg under the tongue every 5 (five) minutes as needed for chest pain.  Marland Kitchen omeprazole (PRILOSEC) 20 MG capsule TAKE 1 CAPSULE BY MOUTH EVERY DAY  . predniSONE (DELTASONE) 5 MG tablet TAKE 1 TABLET BY MOUTH EVERY MORNING TO REDUCE PAIN  . vitamin C (ASCORBIC ACID) 500 MG tablet Take 500 mg by mouth daily.     Allergies:   Ciprofloxacin, Advair diskus [fluticasone-salmeterol], Albuterol, Alendronate sodium, Benadryl [diphenhydramine hcl], Benzonatate, Cephalexin, Gabapentin, Iohexol, Keflex [cephalexin], Morphine, Other, Propoxyphene hcl, Reclast [zoledronic acid], Avapro [irbesartan], Codeine, Tessalon perles, and Tramadol   Social History   Tobacco Use  . Smoking status: Never Smoker  . Smokeless tobacco: Never Used  Substance Use Topics  . Alcohol use: No  . Drug use: No      Family Hx: The patient's family history includes Cancer in her brother; Diabetes in her brother, father, and son; Heart disease in her father; Stroke in her father and mother.  ROS:   Please see the history of present illness.    All other systems reviewed and are negative.   Prior CV studies:   The following studies were reviewed today:  Left Heart Cath and Coronary Angiography  10/2016  Conclusion  Widely patent coronary arteries with mild nonobstructive CAD, no change from previous study  Recommend: medical therapy.   **if repeat cath needed would NOT use right radial access**   Diagnostic Dominance: Right   Echo 09/2016 Study Conclusions   - Left ventricle: The cavity size was normal. There was mild  concentric hypertrophy. Systolic function was vigorous. The  estimated ejection fraction was in the range of 65% to 70%. Wall  motion was normal; there were no regional wall motion  abnormalities. Doppler parameters are consistent with abnormal  left ventricular relaxation (grade 1 diastolic dysfunction).  - Aortic valve: Trileaflet; normal thickness leaflets. There was no  regurgitation.  - Aortic root: The aortic root was normal in size.  - Mitral valve: Mildly thickened leaflets . There was no  regurgitation.  - Left atrium: The atrium was normal in size.  - Right ventricle: The cavity size was normal. Wall thickness was  normal. Systolic function was normal.  - Right atrium: The atrium was normal in size.  - Tricuspid valve: There was mild regurgitation.  - Pulmonic valve: There was no regurgitation.  - Pulmonary arteries: Systolic pressure was within the normal  range.  - Inferior vena cava: The vessel was normal in size.  - Pericardium, extracardiac: A mild pericardial effusion was  identified posterior to the heart. Features were not consistent  with tamponade physiology.   Impressions:   - No significant change since the prior study  on 09/03/15.   Labs/Other Tests and Data Reviewed:    EKG:  No ECG reviewed.  Recent Labs: 04/20/2019: BUN 19; Creat 0.89; Hemoglobin 13.1; Platelets 330; Potassium 3.6; Sodium 143   Recent Lipid Panel Lab Results  Component Value Date/Time   CHOL 140 04/20/2019 09:49 AM   CHOL 215 (H) 08/07/2015 08:54 AM   TRIG 191 (H) 04/20/2019 09:49 AM   HDL 45 (L) 04/20/2019 09:49 AM   HDL 45 08/07/2015 08:54 AM   CHOLHDL 3.1 04/20/2019 09:49 AM   LDLCALC 69 04/20/2019 09:49 AM    Wt Readings from Last 3 Encounters:  09/15/19 200 lb (90.7  kg)  04/24/19 210 lb (95.3 kg)  06/02/18 202 lb (91.6 kg)     Objective:    Vital Signs:  Ht 5\' 2"  (1.575 m)   Wt 200 lb (90.7 kg)   BMI 36.58 kg/m   Does not have a way to check her BP  VITAL SIGNS:  reviewed GEN:  no acute distress PSYCH:  normal affect  ASSESSMENT & PLAN:    1. CAD No angina. Continue ASA, statin and Imdur.   2. HTN -No change in therapy. BP cuff at home does not work. Has follow up with PCP in clinic next week.   3. HLD - Continue statin. Followed by PCP.  -04/20/2019: Cholesterol 140; HDL 45; LDL Cholesterol (Calc) 69; Triglycerides 191  4. DM - Followed by  PCP  COVID-19 Education: The signs and symptoms of COVID-19 were discussed with the patient and how to seek care for testing (follow up with PCP or arrange E-visit).  The importance of social distancing was discussed today.  Time:   Today, I have spent 5  minutes with the patient with telehealth technology discussing the above problems.     Medication Adjustments/Labs and Tests Ordered: Current medicines are reviewed at length with the patient today.  Concerns regarding medicines are outlined above.   Tests Ordered: No orders of the defined types were placed in this encounter.   Medication Changes: No orders of the defined types were placed in this encounter.   Follow Up:  Either In Person or Virtual in 1 year(s)  Signed, Leanor Kail, PA   09/15/2019 1:27 PM    [facility]

## 2019-09-15 NOTE — Telephone Encounter (Signed)
  Patient Consent for Virtual Visit         [name] has provided verbal consent on 09/15/2019 for a virtual visit (video or telephone).   CONSENT FOR VIRTUAL VISIT FOR:  [name]  By participating in this virtual visit I agree to the following:  I hereby voluntarily request, consent and authorize [facility] and its employed or contracted physicians, physician assistants, nurse practitioners or other licensed health care professionals (the Practitioner), to provide me with telemedicine health care services (the "Services") as deemed necessary by the treating Practitioner. I acknowledge and consent to receive the Services by the Practitioner via telemedicine. I understand that the telemedicine visit will involve communicating with the Practitioner through live audiovisual communication technology and the disclosure of certain medical information by electronic transmission. I acknowledge that I have been given the opportunity to request an in-person assessment or other available alternative prior to the telemedicine visit and am voluntarily participating in the telemedicine visit.  I understand that I have the right to withhold or withdraw my consent to the use of telemedicine in the course of my care at any time, without affecting my right to future care or treatment, and that the Practitioner or I may terminate the telemedicine visit at any time. I understand that I have the right to inspect all information obtained and/or recorded in the course of the telemedicine visit and may receive copies of available information for a reasonable fee.  I understand that some of the potential risks of receiving the Services via telemedicine include:  Marland Kitchen Delay or interruption in medical evaluation due to technological equipment failure or disruption; . Information transmitted may not be sufficient (e.g. poor resolution of images) to allow for appropriate medical decision making by the Practitioner;  and/or  . In rare instances, security protocols could fail, causing a breach of personal health information.  Furthermore, I acknowledge that it is my responsibility to provide information about my medical history, conditions and care that is complete and accurate to the best of my ability. I acknowledge that Practitioner's advice, recommendations, and/or decision may be based on factors not within their control, such as incomplete or inaccurate data provided by me or distortions of diagnostic images or specimens that may result from electronic transmissions. I understand that the practice of medicine is not an exact science and that Practitioner makes no warranties or guarantees regarding treatment outcomes. I acknowledge that a copy of this consent can be made available to me via my patient portal ([place]), or I can request a printed copy by calling the office of [place].    I understand that my insurance will be billed for this visit.   I have read or had this consent read to me. . I understand the contents of this consent, which adequately explains the benefits and risks of the Services being provided via telemedicine.  . I have been provided ample opportunity to ask questions regarding this consent and the Services and have had my questions answered to my satisfaction. . I give my informed consent for the services to be provided through the use of telemedicine in my medical care

## 2019-09-15 NOTE — Telephone Encounter (Signed)
  Patient Consent for Virtual Visit         [name] has provided verbal consent on 09/15/2019 for a virtual visit (video or telephone).   CONSENT FOR VIRTUAL VISIT FOR:  [name]  By participating in this virtual visit I agree to the following:  I hereby voluntarily request, consent and authorize [facility] and its employed or contracted physicians, physician assistants, nurse practitioners or other licensed health care professionals (the Practitioner), to provide me with telemedicine health care services (the "Services") as deemed necessary by the treating Practitioner. I acknowledge and consent to receive the Services by the Practitioner via telemedicine. I understand that the telemedicine visit will involve communicating with the Practitioner through live audiovisual communication technology and the disclosure of certain medical information by electronic transmission. I acknowledge that I have been given the opportunity to request an in-person assessment or other available alternative prior to the telemedicine visit and am voluntarily participating in the telemedicine visit.  I understand that I have the right to withhold or withdraw my consent to the use of telemedicine in the course of my care at any time, without affecting my right to future care or treatment, and that the Practitioner or I may terminate the telemedicine visit at any time. I understand that I have the right to inspect all information obtained and/or recorded in the course of the telemedicine visit and may receive copies of available information for a reasonable fee.  I understand that some of the potential risks of receiving the Services via telemedicine include:  Marland Kitchen Delay or interruption in medical evaluation due to technological equipment failure or disruption; . Information transmitted may not be sufficient (e.g. poor resolution of images) to allow for appropriate medical decision making by the Practitioner;  and/or  . In rare instances, security protocols could fail, causing a breach of personal health information.  Furthermore, I acknowledge that it is my responsibility to provide information about my medical history, conditions and care that is complete and accurate to the best of my ability. I acknowledge that Practitioner's advice, recommendations, and/or decision may be based on factors not within their control, such as incomplete or inaccurate data provided by me or distortions of diagnostic images or specimens that may result from electronic transmissions. I understand that the practice of medicine is not an exact science and that Practitioner makes no warranties or guarantees regarding treatment outcomes. I acknowledge that a copy of this consent can be made available to me via my patient portal ([place]), or I can request a printed copy by calling the office of [place].    I understand that my insurance will be billed for this visit.   I have read or had this consent read to me. . I understand the contents of this consent, which adequately explains the benefits and risks of the Services being provided via telemedicine.  . I have been provided ample opportunity to ask questions regarding this consent and the Services and have had my questions answered to my satisfaction. . I give my informed consent for the services to be provided through the use of telemedicine in my medical care

## 2019-09-15 NOTE — Patient Instructions (Signed)
Medication Instructions:  Your physician recommends that you continue on your current medications as directed. Please refer to the Current Medication list given to you today.  *If you need a refill on your cardiac medications before your next appointment, please call your pharmacy*   Lab Work: None ordered  If you have labs (blood work) drawn today and your tests are completely normal, you will receive your results only by: Marland Kitchen MyChart Message (if you have MyChart) OR . A paper copy in the mail If you have any lab test that is abnormal or we need to change your treatment, we will call you to review the results.   Testing/Procedures: None ordered   Follow-Up: At [facility], you and your health needs are our priority.  As part of our continuing mission to provide you with exceptional heart care, we have created designated Provider Care Teams.  These Care Teams include your primary Cardiologist (physician) and Advanced Practice Providers (APPs -  Physician Assistants and Nurse Practitioners) who all work together to provide you with the care you need, when you need it.  We recommend signing up for the patient portal called "MyChart".  Sign up information is provided on this After Visit Summary.  MyChart is used to connect with patients for Virtual Visits (Telemedicine).  Patients are able to view lab/test results, encounter notes, upcoming appointments, etc.  Non-urgent messages can be sent to your provider as well.   To learn more about what you can do with MyChart, go to [email or web address].    Your next appointment:   12 month(s)  The format for your next appointment:   In Person  Provider:   You may see Sherren Mocha, MD or one of the following Advanced Practice Providers on your designated Care Team:    Richardson Dopp, PA-C  [place], [place]  Daune Perch, NP    Other Instructions

## 2019-09-17 ENCOUNTER — Other Ambulatory Visit: Payer: Self-pay | Admitting: Internal Medicine

## 2019-09-17 DIAGNOSIS — M545 Low back pain, unspecified: Secondary | ICD-10-CM

## 2019-09-17 DIAGNOSIS — G8929 Other chronic pain: Secondary | ICD-10-CM

## 2019-09-18 NOTE — Telephone Encounter (Signed)
Pended Rx and sent to Dr. Reed for approval due to HIGH ALERT Warning.  °

## 2019-09-20 ENCOUNTER — Other Ambulatory Visit: Payer: Self-pay | Admitting: Internal Medicine

## 2019-09-20 NOTE — Telephone Encounter (Signed)
Patient has OV 10/25/19 with labs ordered prior to visit. Only a month supply approved.  Will extend refill based on lab/OV evaluation.

## 2019-09-28 ENCOUNTER — Other Ambulatory Visit: Payer: Self-pay | Admitting: Internal Medicine

## 2019-09-28 DIAGNOSIS — E113513 Type 2 diabetes mellitus with proliferative diabetic retinopathy with macular edema, bilateral: Secondary | ICD-10-CM

## 2019-09-28 DIAGNOSIS — H543 Unqualified visual loss, both eyes: Secondary | ICD-10-CM

## 2019-09-28 NOTE — Telephone Encounter (Signed)
rx sent to pharmacy by e-script  

## 2019-10-23 ENCOUNTER — Other Ambulatory Visit: Payer: Medicare Other

## 2019-10-27 ENCOUNTER — Ambulatory Visit: Payer: Medicare Other | Admitting: Internal Medicine

## 2019-11-01 ENCOUNTER — Other Ambulatory Visit: Payer: Self-pay

## 2019-11-01 ENCOUNTER — Other Ambulatory Visit: Payer: Medicare Other

## 2019-11-01 DIAGNOSIS — Z794 Long term (current) use of insulin: Secondary | ICD-10-CM | POA: Diagnosis not present

## 2019-11-01 DIAGNOSIS — E113513 Type 2 diabetes mellitus with proliferative diabetic retinopathy with macular edema, bilateral: Secondary | ICD-10-CM | POA: Diagnosis not present

## 2019-11-01 DIAGNOSIS — Z6838 Body mass index (BMI) 38.0-38.9, adult: Secondary | ICD-10-CM | POA: Diagnosis not present

## 2019-11-01 DIAGNOSIS — I10 Essential (primary) hypertension: Secondary | ICD-10-CM | POA: Diagnosis not present

## 2019-11-01 DIAGNOSIS — I25119 Atherosclerotic heart disease of native coronary artery with unspecified angina pectoris: Secondary | ICD-10-CM | POA: Diagnosis not present

## 2019-11-02 LAB — HEMOGLOBIN A1C
Hgb A1c MFr Bld: 6.3 % of total Hgb — ABNORMAL HIGH (ref <5.7)
Mean Plasma Glucose: 134 (calc)
eAG (mmol/L): 7.4 (calc)

## 2019-11-02 LAB — CBC WITH DIFFERENTIAL/PLATELET
Absolute Monocytes: 475 cells/uL (ref 200–950)
Basophils Absolute: 79 cells/uL (ref 0–200)
Basophils Relative: 0.9 %
Eosinophils Absolute: 132 cells/uL (ref 15–500)
Eosinophils Relative: 1.5 %
HCT: 44.3 % (ref 35.0–45.0)
Hemoglobin: 14.2 g/dL (ref 11.7–15.5)
Lymphs Abs: 2270 cells/uL (ref 850–3900)
MCH: 29.2 pg (ref 27.0–33.0)
MCHC: 32.1 g/dL (ref 32.0–36.0)
MCV: 91.2 fL (ref 80.0–100.0)
MPV: 11.1 fL (ref 7.5–12.5)
Monocytes Relative: 5.4 %
Neutro Abs: 5843 cells/uL (ref 1500–7800)
Neutrophils Relative %: 66.4 %
Platelets: 340 10*3/uL (ref 140–400)
RBC: 4.86 10*6/uL (ref 3.80–5.10)
RDW: 13.1 % (ref 11.0–15.0)
Total Lymphocyte: 25.8 %
WBC: 8.8 10*3/uL (ref 3.8–10.8)

## 2019-11-02 LAB — LIPID PANEL
Cholesterol: 149 mg/dL (ref <200)
HDL: 49 mg/dL — ABNORMAL LOW (ref >=50)
LDL Cholesterol (Calc): 69 mg/dL (calc)
Non-HDL Cholesterol (Calc): 100 mg/dL (calc) (ref <130)
Total CHOL/HDL Ratio: 3 (calc) (ref <5.0)
Triglycerides: 215 mg/dL — ABNORMAL HIGH (ref <150)

## 2019-11-02 LAB — COMPLETE METABOLIC PANEL WITH GFR
AG Ratio: 1.5 (calc) (ref 1.0–2.5)
ALT: 22 U/L (ref 6–29)
AST: 15 U/L (ref 10–35)
Albumin: 3.7 g/dL (ref 3.6–5.1)
Alkaline phosphatase (APISO): 93 U/L (ref 37–153)
BUN/Creatinine Ratio: 23 (calc) — ABNORMAL HIGH (ref 6–22)
BUN: 22 mg/dL (ref 7–25)
CO2: 31 mmol/L (ref 20–32)
Calcium: 9.5 mg/dL (ref 8.6–10.4)
Chloride: 101 mmol/L (ref 98–110)
Creat: 0.97 mg/dL — ABNORMAL HIGH (ref 0.60–0.88)
GFR, Est African American: 63 mL/min/{1.73_m2} (ref >=60)
GFR, Est Non African American: 55 mL/min/{1.73_m2} — ABNORMAL LOW (ref >=60)
Globulin: 2.5 g/dL (calc) (ref 1.9–3.7)
Glucose, Bld: 166 mg/dL — ABNORMAL HIGH (ref 65–99)
Potassium: 3.9 mmol/L (ref 3.5–5.3)
Sodium: 141 mmol/L (ref 135–146)
Total Bilirubin: 0.6 mg/dL (ref 0.2–1.2)
Total Protein: 6.2 g/dL (ref 6.1–8.1)

## 2019-11-06 ENCOUNTER — Other Ambulatory Visit: Payer: Self-pay

## 2019-11-06 ENCOUNTER — Ambulatory Visit (INDEPENDENT_AMBULATORY_CARE_PROVIDER_SITE_OTHER): Payer: Medicare Other | Admitting: Internal Medicine

## 2019-11-06 ENCOUNTER — Encounter: Payer: Self-pay | Admitting: Internal Medicine

## 2019-11-06 VITALS — BP 122/72 | HR 84 | Temp 97.1°F | Ht 62.0 in | Wt 207.0 lb

## 2019-11-06 DIAGNOSIS — R42 Dizziness and giddiness: Secondary | ICD-10-CM

## 2019-11-06 DIAGNOSIS — K224 Dyskinesia of esophagus: Secondary | ICD-10-CM | POA: Diagnosis not present

## 2019-11-06 DIAGNOSIS — R296 Repeated falls: Secondary | ICD-10-CM | POA: Diagnosis not present

## 2019-11-06 DIAGNOSIS — M7989 Other specified soft tissue disorders: Secondary | ICD-10-CM | POA: Diagnosis not present

## 2019-11-06 DIAGNOSIS — E11649 Type 2 diabetes mellitus with hypoglycemia without coma: Secondary | ICD-10-CM

## 2019-11-06 DIAGNOSIS — I25119 Atherosclerotic heart disease of native coronary artery with unspecified angina pectoris: Secondary | ICD-10-CM

## 2019-11-06 DIAGNOSIS — H539 Unspecified visual disturbance: Secondary | ICD-10-CM | POA: Diagnosis not present

## 2019-11-06 MED ORDER — FREESTYLE LIBRE 14 DAY SENSOR MISC: 1.0000 [IU] | Freq: Two times a day (BID) | 3 refills | Status: DC

## 2019-11-06 MED ORDER — INSULIN NPH (HUMAN) (ISOPHANE) 100 UNIT/ML ~~LOC~~ SUSP: SUBCUTANEOUS | 3 refills | Status: DC

## 2019-11-06 NOTE — Patient Instructions (Signed)
We will check a venous doppler of your left leg for the swelling after your fall.  We will make sure you have not had a stroke with a CT of your head. We will check carotid dopplers to assess the circulation to your brain. I recommend you get the covid vaccine series b/c you are at high risk for dying from covid. If you want, we can refer you to GI about your esophageal spasms.   Reduce your insulin to 30 units twice a day and call me if you still have lows OR you are having regular highs over 200.

## 2019-11-06 NOTE — Progress Notes (Signed)
Sugar average that was outstanding has improved considerably to 6.3 from 7.1.  we'll go over all of this at her appt

## 2019-11-06 NOTE — Progress Notes (Signed)
Location:  [facility] clinic Provider:  Rehana Uncapher L. Mariea Clonts, D.O., C.M.D.  Code Status: DNR Goals of Care:  Advanced Directives 11/06/2019  Does Patient Have a Medical Advance Directive? Yes  Type of Advance Directive Out of facility DNR (pink MOST or yellow form)  Does patient want to make changes to medical advance directive? No - Patient declined  Copy of [place] in Chart? -  Would patient like information on creating a medical advance directive? -     Chief Complaint  Patient presents with  . Medical Management of Chronic Issues    6 month follow up    HPI: Patient is a 79 y.o. female seen today for medical management of chronic diseases.  She has at least 13 concerns today after last coming in 11/20.  She continues to live alone and has her name on a weight list for a community in [place] but has not heard from them.    HR elevated at 105 when checked in today.  Improved to 84 after appt.  Had fall when outside and when to move drop cord over against the wall.   Went over forward.  Has a 6x6 slate fireplace and came within inches of striking her head (but did not).  Does have life alert button.  She was not using her assistive devices.  Managed to get herself up.  She got a bump on her left knee.    Left lower leg lateral aspect swollen and so is knee.  It's been more swollen than that before.  No pain in it.  At least 5 weeks when it was swollen.  Was after the fall.    Lost three-tooth crown so has to go tomorrow and have it replaced.    Has buzzing in her head that started after her fall she thinks.  Hearing is worse.  Has hearing aids but not wearing them.  Batteries run out and does not get them.  Says she does not have straight up and down balance.    She has ordered herself a wheelchair--I had made her an appt with Dr. Hassell Done who wrote her the prescription for the wheelchair--lightweight foldable that insurance will pay on.  She is really afraid she will fall.   She can get around anywhere in the house, but she furniture surfs instead of using walker or cane much of the time.    Does have vertigo spells of room spinning.  Says she's had a lot the last few years.  Hates it.  She says she does biofeedback and makes herself think she's going the opposite way.  Had low blood sugar to where her lips and tongue got numb.  hba1c is down to 6.3 which did have me concerned she might be getting lows.  She had not been checking her sugars.   Ranging from 58-300 but only checking the past two days!!!  Having headaches at top of her head. She has been dizzy and difficulty concentrating.    Left foot last three toes are numb.  First two are not.    Eyes feel out of focus.  Began after her eye appt three months ago with Dr. Talbert Forest.  Sees Dr. Zadie Rhine next month--7/6.  He checks the wrinkle on her retina of her left eye.    Eyes are out of focus.  Like one is higher than the other.  Feels like one does not follow other.  If she looks to left, one looks all the  way and other doesn't.  She had a situation 3-4 years ago where it was stacked.  Went to hospital then.    She wakes up at 3am and lays awake until 5-6am.  Going to sleep time varies.  That's not new for her.    Has some nausea and vomiting and pain all up and down her esophagus.  Drinking something makes it go away.  She is self-diagnosing spasms.  Can happen with anything.  Moreso when lying down or leaning over on the sofa.    Has two friends she says died after taking the covid shot when they were fine before.  She's afraid to take it with all of that she's dealing with above.    She's been taking 30 units bid instead of 40 units bid.   Past Medical History:  Diagnosis Date  . Abdominal pain, unspecified site   . Abnormality of gait   . Apnea   . Cardiomegaly   . Chest pain, unspecified   . Complication of anesthesia    hard time waking up  . Depressive disorder, not elsewhere classified   . Diabetes  mellitus   . Diabetic retinopathy ([place])   . Dizziness and giddiness   . Dyskinesia of esophagus   . Edema   . Extrinsic asthma, unspecified   . Female stress incontinence   . Gout, unspecified   . High cholesterol   . Hypertension   . Lipoma of other skin and subcutaneous tissue   . Lumbago   . Memory loss   . Migraine without aura, with intractable migraine, so stated, with status migrainosus   . Mild cognitive impairment, so stated   . Nonspecific (abnormal) findings on radiological and other examination of abdominal area, including retroperitoneum   . Nonspecific abnormal results of liver function study   . Obesity, unspecified   . Obstructive chronic bronchitis with exacerbation ([place])   . Other and unspecified hyperlipidemia   . Other B-complex deficiencies   . Other nonspecific abnormal serum enzyme levels   . Other specified cardiac dysrhythmias(427.89)   . Other specified disease of sebaceous glands   . Other symptoms involving cardiovascular system   . Pain in joint, ankle and foot   . Pain in joint, pelvic region and thigh   . Pain in joint, shoulder region   . Palpitations   . Reflux esophagitis   . Shortness of breath   . Tension headache   . Type I (juvenile type) diabetes mellitus without mention of complication, not stated as uncontrolled   . Type I (juvenile type) diabetes mellitus without mention of complication, uncontrolled   . Unspecified essential hypertension   . Unspecified hypothyroidism   . Unspecified vitamin D deficiency   . Ventral hernia, unspecified, without mention of obstruction or gangrene     Past Surgical History:  Procedure Laterality Date  . ABDOMINAL HYSTERECTOMY    . APPENDECTOMY    . BREAST SURGERY    . EXPLORATORY LAPAROTOMY W/ BOWEL RESECTION  07/25/2004   PROCEDURE: Laparoscopy, open laparotomy, resection of jejunojejunostomy  . INCISIONAL HERNIA REPAIR  03/19/2006   PROCEDURE: Open ventral hernia repair with mesh.  Marland Kitchen  LAPAROSCOPIC GASTRIC BYPASS  07/22/2004   PROCEDURE: Laparoscopic Roux-en-Y gastric bypass, antecolic, antegastric,  . LAPAROSCOPIC INCISIONAL / UMBILICAL / VENTRAL HERNIA REPAIR  09/22/2005   PROCEDURE: Laparoscopic ventral hernia repair with mesh.  Marland Kitchen LEFT HEART CATH AND CORONARY ANGIOGRAPHY N/A 11/20/2016   Procedure: Left Heart Cath and Coronary Angiography;  Surgeon: Sherren Mocha, MD;  Location: [facility];  Service: Cardiovascular;  Laterality: N/A;  . LEFT HEART CATHETERIZATION WITH CORONARY ANGIOGRAM N/A 08/21/2014   Procedure: LEFT HEART CATHETERIZATION WITH CORONARY ANGIOGRAM;  Surgeon: Sherren Mocha, MD;  Location: [facility] CATH LAB;  Service: Cardiovascular;  Laterality: N/A;  . stents     last time she said was 1 yr ago     Allergies  Allergen Reactions  . Ciprofloxacin Rash  . Advair Diskus [Fluticasone-Salmeterol] Other (See Comments)    Throat closes  . Albuterol     REACTION: closes throat  . Alendronate Sodium Itching  . Benadryl [Diphenhydramine Hcl] Itching  . Benzonatate     REACTION: rash/hives  . Cephalexin Nausea Only  . Gabapentin Other (See Comments)    Loss of memory  . Iohexol      Desc: rash and DIF BREATHING   . Keflex [Cephalexin] Nausea And Vomiting  . Morphine Nausea And Vomiting  . Other Other (See Comments)    Decongestants- keeps pt awake at night, increased heart rate  . Propoxyphene Hcl     Doesn't recall  . Reclast [Zoledronic Acid] Other (See Comments)    Chest pain  . Avapro [Irbesartan] Rash  . Codeine Nausea And Vomiting, Swelling and Rash  . Tessalon Perles Rash  . Tramadol Nausea Only and Rash    Outpatient Encounter Medications as of 11/06/2019  Medication Sig  . amLODipine (NORVASC) 2.5 MG tablet TAKE 1 TABLET BY MOUTH EVERY DAY  . aspirin 81 MG tablet Take 81 mg by mouth daily.   Marland Kitchen atorvastatin (LIPITOR) 20 MG tablet Take 1 tablet (20 mg total) by mouth daily.  . Calcium Citrate-Vitamin D (CALCIUM CITRATE + D PO) Take by  mouth daily.  . cetirizine (ZYRTEC) 10 MG tablet Take 10 mg by mouth daily as needed for allergies. For allergies  . Cholecalciferol (VITAMIN D3) 5000 UNITS CAPS Take 1 tablet by mouth daily. For supplement  . CINNAMON PO Take 1,000 mg by mouth daily.  . citalopram (CELEXA) 40 MG tablet TAKE 1 TABLET BY MOUTH EVERY DAY FOR DEPRESSION  . Continuous Blood Gluc Receiver (FREESTYLE LIBRE 14 DAY READER) DEVI CHECK BLOOD SUGAR 4 TIMES DAILY (FASTING AND WITH EACH MEAL)  . docusate sodium (COLACE) 100 MG capsule Take 100 mg by mouth 2 (two) times daily.  . ferrous sulfate 325 (65 FE) MG tablet Take 325 mg by mouth 2 (two) times daily with a meal.  . glipiZIDE (GLUCOTROL XL) 10 MG 24 hr tablet TAKE ONE TABLET BY MOUTH IN THE MORNING AND ONE TABLET IN THE EVENING TO CONTROL BLOOD SUGAR  . insulin NPH Human (HUMULIN N) 100 UNIT/ML injection INJECT 40 UNITS IN THE MORNING AND 40 UNITS IN THE EVENING  . Insulin Syringes, Disposable, U-100 1 ML MISC Use to inject insulin Dx: N23.5573  . isosorbide mononitrate (IMDUR) 30 MG 24 hr tablet Take 1 tablet (30 mg total) by mouth daily.  Marland Kitchen losartan-hydrochlorothiazide (HYZAAR) 50-12.5 MG tablet TAKE 1 TABLET BY MOUTH EVERY DAY FOR BLOOD PRESSURE  . Magnesium Oxide 250 MG TABS Take 500 mg by mouth daily.   . meclizine (ANTIVERT) 25 MG tablet Take 1 tablet (25 mg total) by mouth 3 (three) times daily. For dizziness  . nitroGLYCERIN (NITROSTAT) 0.4 MG SL tablet Place 0.4 mg under the tongue every 5 (five) minutes as needed for chest pain.  Marland Kitchen omeprazole (PRILOSEC) 20 MG capsule TAKE 1 CAPSULE BY MOUTH EVERY DAY  . predniSONE (  DELTASONE) 5 MG tablet TAKE 1 TABLET BY MOUTH EVERY DAY IN THE MORNING TO REDUCE PAIN  . vitamin C (ASCORBIC ACID) 500 MG tablet Take 500 mg by mouth daily.   No facility-administered encounter medications on file as of 11/06/2019.    Review of Systems:  Review of Systems  Constitutional: Positive for malaise/fatigue. Negative for chills and  fever.  HENT: Positive for hearing loss and tinnitus. Negative for congestion, ear pain and sore throat.   Eyes: Positive for double vision. Negative for blurred vision.  Respiratory: Positive for shortness of breath. Negative for cough.   Cardiovascular: Negative for chest pain, palpitations and leg swelling.  Gastrointestinal: Positive for heartburn. Negative for abdominal pain, blood in stool, constipation, diarrhea and melena.  Genitourinary: Negative for dysuria.  Musculoskeletal: Positive for falls, joint pain and myalgias.  Skin: Negative for itching and rash.  Neurological: Positive for dizziness, tingling, sensory change, weakness and headaches. Negative for tremors, speech change, focal weakness, seizures and loss of consciousness.  Endo/Heme/Allergies: Bruises/bleeds easily.  Psychiatric/Behavioral: Positive for memory loss. Negative for depression. The patient has insomnia. The patient is not nervous/anxious.     Health Maintenance  Topic Date Due  . FOOT EXAM  04/12/2019  . INFLUENZA VACCINE  12/31/2019  . HEMOGLOBIN A1C  05/02/2020  . OPHTHALMOLOGY EXAM  09/04/2020  . TETANUS/TDAP  11/11/2021  . DEXA SCAN  Completed  . PNA vac Low Risk Adult  Completed  . COVID-19 Vaccine  Discontinued    Physical Exam: Vitals:   11/06/19 0855  BP: 122/72  Pulse: (!) 105  Temp: (!) 97.1 F (36.2 C)  TempSrc: Temporal  SpO2: 97%  Weight: 207 lb (93.9 kg)  Height: 5\' 2"  (1.575 m)   Body mass index is 37.86 kg/m. Physical Exam Vitals reviewed.  Constitutional:      Appearance: Normal appearance.  HENT:     Head: Normocephalic and atraumatic.     Right Ear: External ear normal. There is no impacted cerumen.     Left Ear: External ear normal. There is no impacted cerumen.     Ears:     Comments: HOH,not using hearing aids Eyes:     Extraocular Movements: Extraocular movements intact.     Conjunctiva/sclera: Conjunctivae normal.     Pupils: Pupils are equal, round, and  reactive to light.     Comments: Unable to correctly count fingers on exam of peripheral vision  Neck:     Vascular: No carotid bruit.  Cardiovascular:     Rate and Rhythm: Normal rate and regular rhythm.     Pulses: Normal pulses.     Heart sounds: Normal heart sounds.  Pulmonary:     Effort: Pulmonary effort is normal. No respiratory distress.     Breath sounds: Normal breath sounds. No wheezing, rhonchi or rales.  Abdominal:     General: Bowel sounds are normal.     Palpations: Abdomen is soft.     Tenderness: There is no abdominal tenderness.     Hernia: A hernia is present.  Musculoskeletal:        General: Swelling present. Normal range of motion.     Right lower leg: No edema.     Left lower leg: Edema present.     Comments: Left lateral lower leg with swollen area and some swelling of knee new since fall  Skin:    General: Skin is warm and dry.     Comments: Small ecchymoses of left knee  Neurological:  Mental Status: She is alert and oriented to person, place, and time. Mental status is at baseline.     Sensory: Sensory deficit present.     Motor: Weakness present.     Coordination: Coordination normal.     Gait: Gait abnormal.     Deep Tendon Reflexes: Reflexes normal.     Comments: Diabetic foot exam; peripheral vision abnormal, no nystagmus, walking unsteadily with cane  Psychiatric:        Mood and Affect: Mood normal.        Behavior: Behavior normal.     Labs reviewed: Basic Metabolic Panel: Recent Labs    04/20/19 0949 11/01/19 0858  NA 143 141  K 3.6 3.9  CL 105 101  CO2 25 31  GLUCOSE 77 166*  BUN 19 22  CREATININE 0.89* 0.97*  CALCIUM 9.2 9.5   Liver Function Tests: Recent Labs    11/01/19 0858  AST 15  ALT 22  BILITOT 0.6  PROT 6.2   No results for input(s): LIPASE, AMYLASE in the last 8760 hours. No results for input(s): AMMONIA in the last 8760 hours. CBC: Recent Labs    04/20/19 0949 11/01/19 0858  WBC 8.3 8.8  NEUTROABS  5,412 5,843  HGB 13.1 14.2  HCT 40.1 44.3  MCV 92.0 91.2  PLT 330 340   Lipid Panel: Recent Labs    04/20/19 0949 11/01/19 0858  CHOL 140 149  HDL 45* 49*  LDLCALC 69 69  TRIG 191* 215*  CHOLHDL 3.1 3.0   Lab Results  Component Value Date   HGBA1C 6.3 (H) 11/01/2019    Assessment/Plan 1. Uncontrolled type 2 diabetes mellitus with hypoglycemia without coma ([place]) - reduce insulin due to lows, libre parts ordered - insulin NPH Human (HUMULIN N) 100 UNIT/ML injection; INJECT 30 UNITS IN THE MORNING AND 30 UNITS IN THE EVENING  Dispense: 60 mL; Refill: 3 - CBC with Differential/Platelet; Future - Basic metabolic panel; Future - Hemoglobin A1c; Future  2. Frequent falls - encouraged walker use, uses cane and furniture and getting wheelchair  - US Carotid Bilateral; Future - CT Head Wo Contrast; Future - VAS [place] LOWER EXTREMITY VENOUS (DVT); Future  3. Esophageal spasm -may refer to GI if ongoing next time--addressing concerns about potential TIA, recent stroke first  4. Vertigo - is not new for her, but other symptoms listed are - US Carotid Bilateral; Future - CT Head Wo Contrast; Future  5. Visual changes - need to inform Dr. Homero Fellers fax note to his office - US Carotid Bilateral; Future - CT Head Wo Contrast; Future  6. Left leg swelling -r/o DVT, may be related to OA of knee - VAS [place] LOWER EXTREMITY VENOUS (DVT); Future   Labs/tests ordered:   Lab Orders     CBC with Differential/Platelet     Basic metabolic panel     Hemoglobin A1c  Next appt:  3 mos med mgt, fasting labs before  Seventy-five mins spent with patient today after she had not been seen for 8 months (missed appts).  Adea Geisel L. Chinwe Lope, D.O. [facility] [address], [place] [address] Cell Phone (Mon-Fri 8am-5pm):  [phone] On Call:  [phone] & follow prompts after 5pm & weekends Office Phone:  [phone] Office Fax:   [phone]

## 2019-11-07 ENCOUNTER — Ambulatory Visit
Admission: RE | Admit: 2019-11-07 | Discharge: 2019-11-07 | Disposition: A | Discharge status: Home / Self Care | Payer: Medicare Other | Source: Ambulatory Visit | Attending: Internal Medicine | Admitting: Internal Medicine

## 2019-11-07 DIAGNOSIS — R413 Other amnesia: Secondary | ICD-10-CM | POA: Diagnosis not present

## 2019-11-07 DIAGNOSIS — R42 Dizziness and giddiness: Secondary | ICD-10-CM

## 2019-11-07 DIAGNOSIS — H539 Unspecified visual disturbance: Secondary | ICD-10-CM

## 2019-11-07 DIAGNOSIS — R296 Repeated falls: Secondary | ICD-10-CM

## 2019-11-07 NOTE — Progress Notes (Signed)
She has chronic changes from poor circulation to her brain which could affect her balance and memory.  No acute strokes were seen.  She also has some sinusitis in her sphenoid sinus which is b/w the eyes and could be causing some headache and affect balance.  I recommend using saline nasal spray 3 times a day to help with her congestion.

## 2019-11-08 ENCOUNTER — Ambulatory Visit (HOSPITAL_COMMUNITY)
Admission: RE | Admit: 2019-11-08 | Discharge: 2019-11-08 | Disposition: A | Discharge status: Home / Self Care | Payer: Medicare Other | Source: Ambulatory Visit | Attending: Internal Medicine | Admitting: Internal Medicine

## 2019-11-08 ENCOUNTER — Telehealth: Payer: Self-pay

## 2019-11-08 ENCOUNTER — Other Ambulatory Visit: Payer: Self-pay

## 2019-11-08 DIAGNOSIS — R296 Repeated falls: Secondary | ICD-10-CM | POA: Diagnosis not present

## 2019-11-08 DIAGNOSIS — M7989 Other specified soft tissue disorders: Secondary | ICD-10-CM | POA: Diagnosis not present

## 2019-11-08 NOTE — Telephone Encounter (Signed)
Helene from [facility] called and states that patient is NEGATIVE for DDT. Results will be put into Epic by [place]. Lillette Boxer also wanted to know who she sends the patient to in order to have her Blood Glucose Disc placed in? Lillette Boxer states that she will call the pharmacy and ask them if they could do it.

## 2019-11-08 NOTE — Telephone Encounter (Signed)
Glad she does not have a DVT. As far as getting the disc placed, pharmacy can probably instruct her how to do this just like we would but most patients place it themselves.

## 2019-11-08 NOTE — Progress Notes (Signed)
I suspect the swelling is due to her fall.  She could wear compression stockings but I don't know who would put them on her at this point with her living alone.

## 2019-11-08 NOTE — Progress Notes (Signed)
No blood clots seen.

## 2019-11-09 ENCOUNTER — Telehealth: Payer: Self-pay | Admitting: Cardiovascular Disease

## 2019-11-09 ENCOUNTER — Other Ambulatory Visit: Payer: Self-pay | Admitting: Internal Medicine

## 2019-11-09 DIAGNOSIS — I25119 Atherosclerotic heart disease of native coronary artery with unspecified angina pectoris: Secondary | ICD-10-CM

## 2019-11-09 DIAGNOSIS — I10 Essential (primary) hypertension: Secondary | ICD-10-CM

## 2019-11-09 DIAGNOSIS — E0859 Diabetes mellitus due to underlying condition with other circulatory complications: Secondary | ICD-10-CM

## 2019-11-09 DIAGNOSIS — E113513 Type 2 diabetes mellitus with proliferative diabetic retinopathy with macular edema, bilateral: Secondary | ICD-10-CM

## 2019-11-09 NOTE — Telephone Encounter (Signed)
rx sent to pharmacy by e-script  

## 2019-11-09 NOTE — Telephone Encounter (Signed)
[name] reports her L leg from knee to ankle is "terribly swollen" and crampy. Her RLE is totally asymptomatic.  She was evaluated by Dr. Mariea Clonts 3 days ago and LE doppler was done. She was DVT negative. Several tests, including head CT and carotids were ordered as well (her office note is available in Epic).  Her weight overall is stable, however she has gained 1-2 pounds "over time."  She says she does not add salt to anything.  Elevating her leg does not alleviate swelling. She is taking meds as directed per med list, but she is also taking Lasix 20 mg daily. This has been added to med list.  Aside from her swollen, crampy LLE, she also c/o increased SOB on exertion and pain in her esophagus that is relieved with drinking.   She requests Dr. Antionette Char recommendations.

## 2019-11-09 NOTE — Telephone Encounter (Signed)
Will send to primary card nurse °

## 2019-11-09 NOTE — Telephone Encounter (Signed)
New message  Pt c/o swelling: STAT is pt has developed SOB within 24 hours  1) How much weight have you gained and in what time span? Patient unsure  2) If swelling, where is the swelling located? Left Leg  3) Are you currently taking a fluid pill? Yes  4) Are you currently SOB? Yes, for about a month  5) Do you have a log of your daily weights (if so, list)? 202 current weight  6) Have you gained 3 pounds in a day or 5 pounds in a week? Patient unsure  7) Have you traveled recently? No

## 2019-11-10 ENCOUNTER — Telehealth: Payer: Self-pay | Admitting: *Deleted

## 2019-11-10 DIAGNOSIS — E113513 Type 2 diabetes mellitus with proliferative diabetic retinopathy with macular edema, bilateral: Secondary | ICD-10-CM

## 2019-11-10 DIAGNOSIS — I10 Essential (primary) hypertension: Secondary | ICD-10-CM

## 2019-11-10 DIAGNOSIS — I25119 Atherosclerotic heart disease of native coronary artery with unspecified angina pectoris: Secondary | ICD-10-CM

## 2019-11-10 DIAGNOSIS — Z794 Long term (current) use of insulin: Secondary | ICD-10-CM

## 2019-11-10 MED ORDER — FREESTYLE LIBRE 14 DAY READER DEVI: 0 refills | Status: DC

## 2019-11-10 NOTE — Telephone Encounter (Signed)
Patient notified and agreed.  

## 2019-11-10 NOTE — Addendum Note (Signed)
Addended by: Rafael Bihari A on: 11/10/2019 04:50 PM   Modules accepted: Orders

## 2019-11-10 NOTE — Telephone Encounter (Signed)
Pended Rx and sent to Dr. Reed for approval due to HIGH ALERT Warning.  °

## 2019-11-10 NOTE — Telephone Encounter (Signed)
Patient called and stated that she needs a new meter because her's broke.   Also patient stated that Flonase was recommended by Dr. Mariea Clonts for her Sinuses. Medication is not in current list. Patient is requesting a Rx to be sent to Pharmacy.   Please Advise.

## 2019-11-10 NOTE — Telephone Encounter (Signed)
Well, actually her allergy list says that her throat closed when she used fluticasone so no steroid nasal spray for her.  She can use saline nasal spray safely though

## 2019-11-10 NOTE — Telephone Encounter (Signed)
Ok to send new reading portion for her meter to pharmacy (I believe the sensor portion was sent the other day when she was at her appt). Ok to send in flonase (but it's over the counter anyway).  1 spray to each nostril daily for allergies/congestion.

## 2019-11-13 ENCOUNTER — Encounter: Payer: Medicare Other | Admitting: Family

## 2019-11-13 NOTE — Telephone Encounter (Signed)
Hard for me to advise without a clinical visit. Reviewed Dr Cyndi Lennert note and the patient had a multitude of complaints. Reviewed meds - could hold amlodipine to see if this improves her swelling.

## 2019-11-14 NOTE — Telephone Encounter (Signed)
Patient was following up on her phone call from last week. She was nervous because she had not heard anything from our office yet. Please call to discuss Dr. Antionette Char recommendations

## 2019-11-14 NOTE — Telephone Encounter (Signed)
Instructed patient to STOP AMLODIPINE. She will monitor symptoms and BP. She will call back if swelling does not resolve or her BP increases. She was grateful for assistance.

## 2019-11-21 ENCOUNTER — Other Ambulatory Visit: Payer: Self-pay

## 2019-11-21 ENCOUNTER — Telehealth: Payer: Self-pay

## 2019-11-21 ENCOUNTER — Encounter: Payer: Self-pay | Admitting: Nurse Practitioner

## 2019-11-21 ENCOUNTER — Ambulatory Visit (INDEPENDENT_AMBULATORY_CARE_PROVIDER_SITE_OTHER): Payer: Medicare Other | Admitting: Nurse Practitioner

## 2019-11-21 DIAGNOSIS — Z0000 Encounter for general adult medical examination without abnormal findings: Secondary | ICD-10-CM

## 2019-11-21 NOTE — Patient Instructions (Signed)
Ms. Kegel , Thank you for taking time to come for your Medicare Wellness Visit. I appreciate your ongoing commitment to your health goals. Please review the following plan we discussed and let me know if I can assist you in the future.   Screening recommendations/referrals: Colonoscopy aged out Mammogram aged out Bone Density overdue pt declines Recommended yearly ophthalmology/optometry visit for glaucoma screening and checkup Recommended yearly dental visit for hygiene and checkup  Vaccinations: Influenza vaccine up to date Pneumococcal vaccine up to date Tdap vaccine up to date Shingles vaccine up to date    Advanced directives: on file.   Conditions/risks identified: falls, advance age, obesity, cardiovascular disease, complications related to diabetes, debility  Next appointment: 1 year   Preventive Care 65 Years and Older, Female Preventive care refers to lifestyle choices and visits with your health care provider that can promote health and wellness. What does preventive care include?  A yearly physical exam. This is also called an annual well check.  Dental exams once or twice a year.  Routine eye exams. Ask your health care provider how often you should have your eyes checked.  Personal lifestyle choices, including:  Daily care of your teeth and gums.  Regular physical activity.  Eating a healthy diet.  Avoiding tobacco and drug use.  Limiting alcohol use.  Practicing safe sex.  Taking low-dose aspirin every day.  Taking vitamin and mineral supplements as recommended by your health care provider. What happens during an annual well check? The services and screenings done by your health care provider during your annual well check will depend on your age, overall health, lifestyle risk factors, and family history of disease. Counseling  Your health care provider may ask you questions about your:  Alcohol use.  Tobacco use.  Drug use.  Emotional  well-being.  Home and relationship well-being.  Sexual activity.  Eating habits.  History of falls.  Memory and ability to understand (cognition).  Work and work Statistician.  Reproductive health. Screening  You may have the following tests or measurements:  Height, weight, and BMI.  Blood pressure.  Lipid and cholesterol levels. These may be checked every 5 years, or more frequently if you are over 74 years old.  Skin check.  Lung cancer screening. You may have this screening every year starting at age 27 if you have a 30-pack-year history of smoking and currently smoke or have quit within the past 15 years.  Fecal occult blood test (FOBT) of the stool. You may have this test every year starting at age 76.  Flexible sigmoidoscopy or colonoscopy. You may have a sigmoidoscopy every 5 years or a colonoscopy every 10 years starting at age 15.  Hepatitis C blood test.  Hepatitis B blood test.  Sexually transmitted disease (STD) testing.  Diabetes screening. This is done by checking your blood sugar (glucose) after you have not eaten for a while (fasting). You may have this done every 1-3 years.  Bone density scan. This is done to screen for osteoporosis. You may have this done starting at age 22.  Mammogram. This may be done every 1-2 years. Talk to your health care provider about how often you should have regular mammograms. Talk with your health care provider about your test results, treatment options, and if necessary, the need for more tests. Vaccines  Your health care provider may recommend certain vaccines, such as:  Influenza vaccine. This is recommended every year.  Tetanus, diphtheria, and acellular pertussis (Tdap, Td) vaccine. You  may need a Td booster every 10 years.  Zoster vaccine. You may need this after age 27.  Pneumococcal 13-valent conjugate (PCV13) vaccine. One dose is recommended after age 30.  Pneumococcal polysaccharide (PPSV23) vaccine. One  dose is recommended after age 81. Talk to your health care provider about which screenings and vaccines you need and how often you need them. This information is not intended to replace advice given to you by your health care provider. Make sure you discuss any questions you have with your health care provider. Document Released: 06/14/2015 Document Revised: 02/05/2016 Document Reviewed: 03/19/2015 Elsevier Interactive Patient Education  2017 [place] Prevention in the Home Falls can cause injuries. They can happen to people of all ages. There are many things you can do to make your home safe and to help prevent falls. What can I do on the outside of my home?  Regularly fix the edges of walkways and driveways and fix any cracks.  Remove anything that might make you trip as you walk through a door, such as a raised step or threshold.  Trim any bushes or trees on the path to your home.  Use bright outdoor lighting.  Clear any walking paths of anything that might make someone trip, such as rocks or tools.  Regularly check to see if handrails are loose or broken. Make sure that both sides of any steps have handrails.  Any raised decks and porches should have guardrails on the edges.  Have any leaves, snow, or ice cleared regularly.  Use sand or salt on walking paths during winter.  Clean up any spills in your garage right away. This includes oil or grease spills. What can I do in the bathroom?  Use night lights.  Install grab bars by the toilet and in the tub and shower. Do not use towel bars as grab bars.  Use non-skid mats or decals in the tub or shower.  If you need to sit down in the shower, use a plastic, non-slip stool.  Keep the floor dry. Clean up any water that spills on the floor as soon as it happens.  Remove soap buildup in the tub or shower regularly.  Attach bath mats securely with double-sided non-slip rug tape.  Do not have throw rugs and other  things on the floor that can make you trip. What can I do in the bedroom?  Use night lights.  Make sure that you have a light by your bed that is easy to reach.  Do not use any sheets or blankets that are too big for your bed. They should not hang down onto the floor.  Have a firm chair that has side arms. You can use this for support while you get dressed.  Do not have throw rugs and other things on the floor that can make you trip. What can I do in the kitchen?  Clean up any spills right away.  Avoid walking on wet floors.  Keep items that you use a lot in easy-to-reach places.  If you need to reach something above you, use a strong step stool that has a grab bar.  Keep electrical cords out of the way.  Do not use floor polish or wax that makes floors slippery. If you must use wax, use non-skid floor wax.  Do not have throw rugs and other things on the floor that can make you trip. What can I do with my stairs?  Do not leave any items  on the stairs.  Make sure that there are handrails on both sides of the stairs and use them. Fix handrails that are broken or loose. Make sure that handrails are as long as the stairways.  Check any carpeting to make sure that it is firmly attached to the stairs. Fix any carpet that is loose or worn.  Avoid having throw rugs at the top or bottom of the stairs. If you do have throw rugs, attach them to the floor with carpet tape.  Make sure that you have a light switch at the top of the stairs and the bottom of the stairs. If you do not have them, ask someone to add them for you. What else can I do to help prevent falls?  Wear shoes that:  Do not have high heels.  Have rubber bottoms.  Are comfortable and fit you well.  Are closed at the toe. Do not wear sandals.  If you use a stepladder:  Make sure that it is fully opened. Do not climb a closed stepladder.  Make sure that both sides of the stepladder are locked into place.  Ask  someone to hold it for you, if possible.  Clearly mark and make sure that you can see:  Any grab bars or handrails.  First and last steps.  Where the edge of each step is.  Use tools that help you move around (mobility aids) if they are needed. These include:  Canes.  Walkers.  Scooters.  Crutches.  Turn on the lights when you go into a dark area. Replace any light bulbs as soon as they burn out.  Set up your furniture so you have a clear path. Avoid moving your furniture around.  If any of your floors are uneven, fix them.  If there are any pets around you, be aware of where they are.  Review your medicines with your doctor. Some medicines can make you feel dizzy. This can increase your chance of falling. Ask your doctor what other things that you can do to help prevent falls. This information is not intended to replace advice given to you by your health care provider. Make sure you discuss any questions you have with your health care provider. Document Released: 03/14/2009 Document Revised: 10/24/2015 Document Reviewed: 06/22/2014 Elsevier Interactive Patient Education  2017 [employer].

## 2019-11-21 NOTE — Telephone Encounter (Signed)
[name], laing are scheduled for a virtual visit with your provider today.    Just as we do with appointments in the office, we must obtain your consent to participate.  Your consent will be active for this visit and any virtual visit you may have with one of our providers in the next 365 days.    If you have a MyChart account, I can also send a copy of this consent to you electronically.  All virtual visits are billed to your insurance company just like a traditional visit in the office.  As this is a virtual visit, video technology does not allow for your provider to perform a traditional examination.  This may limit your provider's ability to fully assess your condition.  If your provider identifies any concerns that need to be evaluated in person or the need to arrange testing such as labs, EKG, etc, we will make arrangements to do so.    Although advances in technology are sophisticated, we cannot ensure that it will always work on either your end or our end.  If the connection with a video visit is poor, we may have to switch to a telephone visit.  With either a video or telephone visit, we are not always able to ensure that we have a secure connection.   I need to obtain your verbal consent now.   Are you willing to proceed with your visit today?   [name] has provided verbal consent on 11/21/2019 for a virtual visit (video or telephone).   Carroll Kinds, CMA 11/21/2019  9:02 AM

## 2019-11-21 NOTE — Progress Notes (Signed)
This service is provided via telemedicine  No vital signs collected/recorded due to the encounter was a telemedicine visit.   Location of patient (ex: home, work):  Home  Patient consents to a telephone visit:  Yes, see encounter dated 11/21/2019  Location of the provider (ex: office, home):  [facility]  Name of any referring provider:  Hollace Kinnier  Names of all persons participating in the telemedicine service and their role in the encounter:  Sherrie Mustache, Nurse Practitioner, Carroll Kinds, CMA, and patient.   Time spent on call:  7 minutes with medical assistant

## 2019-11-21 NOTE — Progress Notes (Signed)
Subjective:   [name] is a 79 y.o. female who presents for Medicare Annual (Subsequent) preventive examination.  Review of Systems     Cardiac Risk Factors include: advanced age (>66men, >50 women);diabetes mellitus;hypertension;dyslipidemia;obesity (BMI >30kg/m2);sedentary lifestyle;family history of premature cardiovascular disease     Objective:    There were no vitals filed for this visit. There is no height or weight on file to calculate BMI.  Advanced Directives 11/21/2019 11/06/2019 11/11/2018 11/09/2017 11/09/2017 04/27/2017 03/17/2017  Does Patient Have a Medical Advance Directive? Yes Yes Yes No Yes Yes No  Type of Advance Directive Out of facility DNR (pink MOST or yellow form) Out of facility DNR (pink MOST or yellow form) - - Living will;Healthcare Power of [place];Living will;Out of facility DNR (pink MOST or yellow form) -  Does patient want to make changes to medical advance directive? No - Patient declined No - Patient declined No - Patient declined - No - Patient declined - -  Copy of [place] in Chart? - - - - No - copy requested No - copy requested -  Would patient like information on creating a medical advance directive? - - - - - - -  Pre-existing out of facility DNR order (yellow form or pink MOST form) Yellow form placed in chart (order not valid for inpatient use) - - - - - -    Current Medications (verified) Outpatient Encounter Medications as of 11/21/2019  Medication Sig  . aspirin 81 MG tablet Take 81 mg by mouth daily.   Marland Kitchen atorvastatin (LIPITOR) 20 MG tablet Take 1 tablet (20 mg total) by mouth daily.  . Calcium Citrate-Vitamin D (CALCIUM CITRATE + D PO) Take by mouth daily.  . cetirizine (ZYRTEC) 10 MG tablet Take 10 mg by mouth daily as needed for allergies. For allergies  . Cholecalciferol (VITAMIN D3) 5000 UNITS CAPS Take 1 tablet by mouth daily. For supplement  . CINNAMON PO Take 1,000 mg by mouth  daily.  . citalopram (CELEXA) 40 MG tablet TAKE 1 TABLET BY MOUTH EVERY DAY FOR DEPRESSION  . Continuous Blood Gluc Receiver (FREESTYLE LIBRE 14 DAY READER) DEVI CHECK BLOOD SUGAR 4 TIMES DAILY (FASTING AND WITH EACH MEAL) Dx: E11.9  . Continuous Blood Gluc Sensor (FREESTYLE LIBRE 14 DAY SENSOR) MISC Inject 1 Units into the skin 2 (two) times daily.  Marland Kitchen docusate sodium (COLACE) 100 MG capsule Take 100 mg by mouth 2 (two) times daily.  . ferrous sulfate 325 (65 FE) MG tablet Take 325 mg by mouth 2 (two) times daily with a meal.  . furosemide (LASIX) 20 MG tablet Take 20 mg by mouth.  Marland Kitchen glipiZIDE (GLUCOTROL XL) 10 MG 24 hr tablet TAKE ONE TABLET BY MOUTH IN THE MORNING AND ONE TABLET IN THE EVENING TO CONTROL BLOOD SUGAR  . insulin NPH Human (HUMULIN N) 100 UNIT/ML injection INJECT 30 UNITS IN THE MORNING AND 30 UNITS IN THE EVENING  . Insulin Syringes, Disposable, U-100 1 ML MISC Use to inject insulin Dx: D32.2025  . isosorbide mononitrate (IMDUR) 30 MG 24 hr tablet Take 1 tablet (30 mg total) by mouth daily.  Marland Kitchen losartan-hydrochlorothiazide (HYZAAR) 50-12.5 MG tablet TAKE 1 TABLET BY MOUTH EVERY DAY FOR BLOOD PRESSURE  . Magnesium Oxide 250 MG TABS Take 500 mg by mouth daily.   . meclizine (ANTIVERT) 25 MG tablet Take 1 tablet (25 mg total) by mouth 3 (three) times daily. For dizziness  . nitroGLYCERIN (NITROSTAT) 0.4  MG SL tablet Place 0.4 mg under the tongue every 5 (five) minutes as needed for chest pain.  Marland Kitchen omeprazole (PRILOSEC) 20 MG capsule TAKE 1 CAPSULE BY MOUTH EVERY DAY  . predniSONE (DELTASONE) 5 MG tablet TAKE 1 TABLET BY MOUTH EVERY DAY IN THE MORNING TO REDUCE PAIN  . vitamin C (ASCORBIC ACID) 500 MG tablet Take 500 mg by mouth daily.   No facility-administered encounter medications on file as of 11/21/2019.    Allergies (verified) Ciprofloxacin, Advair diskus [fluticasone-salmeterol], Albuterol, Alendronate sodium, Benadryl [diphenhydramine hcl], Benzonatate, Cephalexin,  Gabapentin, Iohexol, Keflex [cephalexin], Morphine, Other, Propoxyphene hcl, Reclast [zoledronic acid], Avapro [irbesartan], Codeine, Tessalon perles, and Tramadol   History: Past Medical History:  Diagnosis Date  . Abdominal pain, unspecified site   . Abnormality of gait   . Apnea   . Cardiomegaly   . Chest pain, unspecified   . Complication of anesthesia    hard time waking up  . Depressive disorder, not elsewhere classified   . Diabetes mellitus   . Diabetic retinopathy ([place])   . Dizziness and giddiness   . Dyskinesia of esophagus   . Edema   . Extrinsic asthma, unspecified   . Female stress incontinence   . Gout, unspecified   . High cholesterol   . Hypertension   . Lipoma of other skin and subcutaneous tissue   . Lumbago   . Memory loss   . Migraine without aura, with intractable migraine, so stated, with status migrainosus   . Mild cognitive impairment, so stated   . Nonspecific (abnormal) findings on radiological and other examination of abdominal area, including retroperitoneum   . Nonspecific abnormal results of liver function study   . Obesity, unspecified   . Obstructive chronic bronchitis with exacerbation ([place])   . Other and unspecified hyperlipidemia   . Other B-complex deficiencies   . Other nonspecific abnormal serum enzyme levels   . Other specified cardiac dysrhythmias(427.89)   . Other specified disease of sebaceous glands   . Other symptoms involving cardiovascular system   . Pain in joint, ankle and foot   . Pain in joint, pelvic region and thigh   . Pain in joint, shoulder region   . Palpitations   . Reflux esophagitis   . Shortness of breath   . Tension headache   . Type I (juvenile type) diabetes mellitus without mention of complication, not stated as uncontrolled   . Type I (juvenile type) diabetes mellitus without mention of complication, uncontrolled   . Unspecified essential hypertension   . Unspecified hypothyroidism   . Unspecified  vitamin D deficiency   . Ventral hernia, unspecified, without mention of obstruction or gangrene    Past Surgical History:  Procedure Laterality Date  . ABDOMINAL HYSTERECTOMY    . APPENDECTOMY    . BREAST SURGERY    . EXPLORATORY LAPAROTOMY W/ BOWEL RESECTION  07/25/2004   PROCEDURE: Laparoscopy, open laparotomy, resection of jejunojejunostomy  . INCISIONAL HERNIA REPAIR  03/19/2006   PROCEDURE: Open ventral hernia repair with mesh.  Marland Kitchen LAPAROSCOPIC GASTRIC BYPASS  07/22/2004   PROCEDURE: Laparoscopic Roux-en-Y gastric bypass, antecolic, antegastric,  . LAPAROSCOPIC INCISIONAL / UMBILICAL / VENTRAL HERNIA REPAIR  09/22/2005   PROCEDURE: Laparoscopic ventral hernia repair with mesh.  Marland Kitchen LEFT HEART CATH AND CORONARY ANGIOGRAPHY N/A 11/20/2016   Procedure: Left Heart Cath and Coronary Angiography;  Surgeon: Sherren Mocha, MD;  Location: [facility];  Service: Cardiovascular;  Laterality: N/A;  . LEFT HEART CATHETERIZATION WITH CORONARY ANGIOGRAM  N/A 08/21/2014   Procedure: LEFT HEART CATHETERIZATION WITH CORONARY ANGIOGRAM;  Surgeon: Sherren Mocha, MD;  Location: [facility];  Service: Cardiovascular;  Laterality: N/A;  . stents     last time she said was 1 yr ago    Family History  Problem Relation Age of Onset  . Stroke Mother   . Stroke Father   . Diabetes Father   . Heart disease Father   . Diabetes Son   . Cancer Brother        BLADDER  . Diabetes Brother    Social History   Socioeconomic History  . Marital status: Widowed    Spouse name: Not on file  . Number of children: Not on file  . Years of education: Not on file  . Highest education level: Not on file  Occupational History  . Not on file  Tobacco Use  . Smoking status: Never Smoker  . Smokeless tobacco: Never Used  Vaping Use  . Vaping Use: Never used  Substance and Sexual Activity  . Alcohol use: No  . Drug use: No  . Sexual activity: Not Currently  Other Topics Concern  . Not on file  Social  History Narrative  . Not on file   Social Determinants of Health   Financial Resource Strain:   . Difficulty of Paying Living Expenses:   Food Insecurity:   . Worried About Charity fundraiser in the Last Year:   . Arboriculturist in the Last Year:   Transportation Needs:   . Film/video editor (Medical):   Marland Kitchen Lack of Transportation (Non-Medical):   Physical Activity:   . Days of Exercise per Week:   . Minutes of Exercise per Session:   Stress:   . Feeling of Stress :   Social Connections:   . Frequency of Communication with Friends and Family:   . Frequency of Social Gatherings with Friends and Family:   . Attends Religious Services:   . Active Member of Clubs or Organizations:   . Attends Archivist Meetings:   Marland Kitchen Marital Status:     Tobacco Counseling Counseling given: Not Answered   Clinical Intake:  Pre-visit preparation completed: Yes  Pain : No/denies pain     BMI - recorded: 36.9 Nutritional Status: BMI > 30  Obese Diabetes: Yes     Diabetic?yes         Activities of Daily Living In your present state of health, do you have any difficulty performing the following activities: 11/21/2019  Hearing? Y  Comment hearing aides  Vision? N  Difficulty concentrating or making decisions? N  Walking or climbing stairs? Y  Comment does not climb stairs  Dressing or bathing? N  Doing errands, shopping? N  Preparing Food and eating ? N  Using the Toilet? N  In the past six months, have you accidently leaked urine? N  Do you have problems with loss of bowel control? N  Managing your Medications? N  Managing your Finances? N  Housekeeping or managing your Housekeeping? N  Some recent data might be hidden    Patient Care Team: Gayland Curry, DO as PCP - General (Geriatric Medicine) Sherren Mocha, MD as PCP - Cardiology (Cardiology) Excell Seltzer, MD (Inactive) as Consulting Physician (General Surgery) Tanda Rockers, MD as  Consulting Physician (Pulmonary Disease) Ronald Lobo, MD as Consulting Physician (Gastroenterology) Jacolyn Reedy, MD as Consulting Physician (Cardiology) Zadie Rhine Clent Demark, MD as Consulting Physician (Ophthalmology) Talbert Forest,  Christia Reading, MD as Consulting Physician (Ophthalmology)  Indicate any recent Medical Services you may have received from other than [facility] providers in the past year (date may be approximate).     Assessment:   This is a routine wellness examination for Sasha.  Hearing/Vision screen  Hearing Screening   125Hz  250Hz  500Hz  1000Hz  2000Hz  3000Hz  4000Hz  6000Hz  8000Hz   Right ear:           Left ear:           Comments: Patient wears hearing aids both ears  Vision Screening Comments: Patient wears glasses. Patient has had an eye exam within the last 6 months  Dietary issues and exercise activities discussed: Current Exercise Habits: Home exercise routine, Type of exercise: calisthenics, Time (Minutes): 10, Frequency (Times/Week): 7, Weekly Exercise (Minutes/Week): 70  Goals    . Decrease Stress Level     Patient will try to decrease stress level with yoga and having some time for herself.    . Patient Stated     Maintain current lifesyle       Depression Screen PHQ 2/9 Scores 11/21/2019 11/06/2019 04/24/2019 11/11/2018 06/02/2018 04/11/2018 11/09/2017  PHQ - 2 Score 0 0 0 0 0 0 1  PHQ- 9 Score - - - - - - -    Fall Risk Fall Risk  11/21/2019 11/06/2019 04/24/2019 11/11/2018 06/02/2018  Falls in the past year? 1 1 0 0 0  Number falls in past yr: 1 0 0 0 0  Injury with Fall? 0 1 0 0 0  Comment - - - - -    Any stairs in or around the home? Yes  If so, are there any without handrails? No  Home free of loose throw rugs in walkways, pet beds, electrical cords, etc? Yes  Adequate lighting in your home to reduce risk of falls? Yes   ASSISTIVE DEVICES UTILIZED TO PREVENT FALLS:  Life alert? Yes  Use of a cane, walker or w/c? Yes  Grab bars in the bathroom? No  Shower  chair or bench in shower? Yes  Elevated toilet seat or a handicapped toilet? Yes   TIMED UP AND GO:  Was the test performed? No .  na  Cognitive Function: MMSE - Mini Mental State Exam 11/09/2017 11/04/2016  Orientation to time 5 5  Orientation to Place 5 5  Registration 3 3  Attention/ Calculation 5 5  Recall 3 2  Language- name 2 objects 2 2  Language- repeat 1 1  Language- follow 3 step command 3 3  Language- read & follow direction 1 1  Write a sentence 1 1  Copy design 1 1  Total score 30 29     6CIT Screen 11/21/2019 11/11/2018  What Year? 0 points 0 points  What month? 0 points 0 points  What time? 0 points 0 points  Count back from 20 0 points 0 points  Months in reverse 0 points 0 points  Repeat phrase 0 points 0 points  Total Score 0 0    Immunizations Immunization History  Administered Date(s) Administered  . DTaP 11/12/2011  . Fluad Quad(high Dose 65+) 02/09/2019  . Influenza Split 02/13/2009, 02/28/2014  . Influenza, High Dose Seasonal PF 03/17/2017, 02/10/2018  . Influenza,inj,Quad PF,6+ Mos 05/01/2015, 03/04/2016  . Influenza-Unspecified 02/22/2013  . Pneumococcal Conjugate-13 06/07/2013  . Pneumococcal Polysaccharide-23 11/04/2016  . Tdap 11/12/2011  . Zoster Recombinat (Shingrix) 12/17/2017, 06/02/2018    TDAP status: Up to date Flu Vaccine status: Up to date Pneumococcal  vaccine status: Up to date Covid-19 vaccine status: Declined, Education has been provided regarding the importance of this vaccine but patient still declined. Advised may receive this vaccine at local pharmacy or Health Dept.or vaccine clinic. Aware to provide a copy of the vaccination record if obtained from local pharmacy or Health Dept. Verbalized acceptance and understanding.  Qualifies for Shingles Vaccine? Yes   Zostavax completed No   Shingrix Completed?: Yes  Screening Tests Health Maintenance  Topic Date Due  . INFLUENZA VACCINE  12/31/2019  . HEMOGLOBIN A1C   05/02/2020  . OPHTHALMOLOGY EXAM  09/04/2020  . FOOT EXAM  11/05/2020  . TETANUS/TDAP  11/11/2021  . DEXA SCAN  Completed  . PNA vac Low Risk Adult  Completed  . COVID-19 Vaccine  Discontinued    Health Maintenance  There are no preventive care reminders to display for this patient.  Colorectal cancer screening: No longer required.  Mammogram status: No longer required.   pt declined bone density  Lung Cancer Screening: (Low Dose CT Chest recommended if Age 36-80 years, 30 pack-year currently smoking OR have quit w/in 15years.) does not qualify.   Lung Cancer Screening Referral: na  Additional Screening:  Hepatitis C Screening: does not qualify  Vision Screening: Recommended annual ophthalmology exams for early detection of glaucoma and other disorders of the eye. Is the patient up to date with their annual eye exam?  Yes  Who is the provider or what is the name of the office in which the patient attends annual eye exams? Dr Zadie Rhine, Dr Talbert Forest If pt is not established with a provider, would they like to be referred to a provider to establish care? =  Dental Screening: Recommended annual dental exams for proper oral hygiene  Community Resource Referral / Chronic Care Management: CRR required this visit?  No   CCM required this visit?  No      Plan:     I have personally reviewed and noted the following in the patient's chart:   . Medical and social history . Use of alcohol, tobacco or illicit drugs  . Current medications and supplements . Functional ability and status . Nutritional status . Physical activity . Advanced directives . List of other physicians . Hospitalizations, surgeries, and ER visits in previous 12 months . Vitals . Screenings to include cognitive, depression, and falls . Referrals and appointments  In addition, I have reviewed and discussed with patient certain preventive protocols, quality metrics, and best practice recommendations. A written  personalized care plan for preventive services as well as general preventive health recommendations were provided to patient.     Lauree Chandler, NP   11/21/2019

## 2019-12-05 ENCOUNTER — Other Ambulatory Visit: Payer: Self-pay

## 2019-12-05 ENCOUNTER — Encounter (INDEPENDENT_AMBULATORY_CARE_PROVIDER_SITE_OTHER): Payer: Self-pay | Admitting: Ophthalmology

## 2019-12-05 ENCOUNTER — Ambulatory Visit (INDEPENDENT_AMBULATORY_CARE_PROVIDER_SITE_OTHER): Payer: Medicare Other | Admitting: Ophthalmology

## 2019-12-05 DIAGNOSIS — H35372 Puckering of macula, left eye: Secondary | ICD-10-CM | POA: Diagnosis not present

## 2019-12-05 DIAGNOSIS — E113491 Type 2 diabetes mellitus with severe nonproliferative diabetic retinopathy without macular edema, right eye: Secondary | ICD-10-CM | POA: Diagnosis not present

## 2019-12-05 DIAGNOSIS — E113411 Type 2 diabetes mellitus with severe nonproliferative diabetic retinopathy with macular edema, right eye: Secondary | ICD-10-CM | POA: Insufficient documentation

## 2019-12-05 NOTE — Progress Notes (Signed)
12/05/2019     CHIEF COMPLAINT Patient presents for Retina Follow Up   HISTORY OF PRESENT ILLNESS: [name] is a 79 y.o. female who presents to the clinic today for:   HPI    Retina Follow Up    Patient presents with  Diabetic Retinopathy.  In both eyes.  Duration of 10 months.  Since onset it is stable.          Comments    10 month follow up - FP OU Patient states that she is having trouble focusing. Patient states when she looks to the left, her eyes wont focus. LBS 109 this AM /// A1C 6.2       Last edited by Gerda Diss on 12/05/2019 10:09 AM. (History)      Referring physician: Gayland Curry, DO [place],  [place] [id number]  HISTORICAL INFORMATION:   Selected notes from the MEDICAL RECORD NUMBER    Lab Results  Component Value Date   HGBA1C 6.3 (H) 11/01/2019     CURRENT MEDICATIONS: No current outpatient medications on file. (Ophthalmic Drugs)   No current facility-administered medications for this visit. (Ophthalmic Drugs)   Current Outpatient Medications (Other)  Medication Sig  . aspirin 81 MG tablet Take 81 mg by mouth daily.   Marland Kitchen atorvastatin (LIPITOR) 20 MG tablet Take 1 tablet (20 mg total) by mouth daily.  . Calcium Citrate-Vitamin D (CALCIUM CITRATE + D PO) Take by mouth daily.  . cetirizine (ZYRTEC) 10 MG tablet Take 10 mg by mouth daily as needed for allergies. For allergies  . Cholecalciferol (VITAMIN D3) 5000 UNITS CAPS Take 1 tablet by mouth daily. For supplement  . CINNAMON PO Take 1,000 mg by mouth daily.  . citalopram (CELEXA) 40 MG tablet TAKE 1 TABLET BY MOUTH EVERY DAY FOR DEPRESSION  . Continuous Blood Gluc Receiver (FREESTYLE LIBRE 14 DAY READER) DEVI CHECK BLOOD SUGAR 4 TIMES DAILY (FASTING AND WITH EACH MEAL) Dx: E11.9  . Continuous Blood Gluc Sensor (FREESTYLE LIBRE 14 DAY SENSOR) MISC Inject 1 Units into the skin 2 (two) times daily.  Marland Kitchen docusate sodium (COLACE) 100 MG capsule Take 100 mg by mouth 2 (two) times  daily.  . ferrous sulfate 325 (65 FE) MG tablet Take 325 mg by mouth 2 (two) times daily with a meal.  . furosemide (LASIX) 20 MG tablet Take 20 mg by mouth.  Marland Kitchen glipiZIDE (GLUCOTROL XL) 10 MG 24 hr tablet TAKE ONE TABLET BY MOUTH IN THE MORNING AND ONE TABLET IN THE EVENING TO CONTROL BLOOD SUGAR  . insulin NPH Human (HUMULIN N) 100 UNIT/ML injection INJECT 30 UNITS IN THE MORNING AND 30 UNITS IN THE EVENING  . Insulin Syringes, Disposable, U-100 1 ML MISC Use to inject insulin Dx: A41.6606  . isosorbide mononitrate (IMDUR) 30 MG 24 hr tablet Take 1 tablet (30 mg total) by mouth daily.  Marland Kitchen losartan-hydrochlorothiazide (HYZAAR) 50-12.5 MG tablet TAKE 1 TABLET BY MOUTH EVERY DAY FOR BLOOD PRESSURE  . Magnesium Oxide 250 MG TABS Take 500 mg by mouth daily.   . meclizine (ANTIVERT) 25 MG tablet Take 1 tablet (25 mg total) by mouth 3 (three) times daily. For dizziness  . nitroGLYCERIN (NITROSTAT) 0.4 MG SL tablet Place 0.4 mg under the tongue every 5 (five) minutes as needed for chest pain.  Marland Kitchen omeprazole (PRILOSEC) 20 MG capsule TAKE 1 CAPSULE BY MOUTH EVERY DAY  . predniSONE (DELTASONE) 5 MG tablet TAKE 1 TABLET BY MOUTH EVERY  DAY IN THE MORNING TO REDUCE PAIN  . vitamin C (ASCORBIC ACID) 500 MG tablet Take 500 mg by mouth daily.   No current facility-administered medications for this visit. (Other)      REVIEW OF SYSTEMS:    ALLERGIES Allergies  Allergen Reactions  . Ciprofloxacin Rash  . Advair Diskus [Fluticasone-Salmeterol] Other (See Comments)    Throat closes  . Albuterol     REACTION: closes throat  . Alendronate Sodium Itching  . Benadryl [Diphenhydramine Hcl] Itching  . Benzonatate     REACTION: rash/hives  . Cephalexin Nausea Only  . Gabapentin Other (See Comments)    Loss of memory  . Iohexol      Desc: rash and DIF BREATHING   . Keflex [Cephalexin] Nausea And Vomiting  . Morphine Nausea And Vomiting  . Other Other (See Comments)    Decongestants- keeps pt awake at  night, increased heart rate  . Propoxyphene Hcl     Doesn't recall  . Reclast [Zoledronic Acid] Other (See Comments)    Chest pain  . Avapro [Irbesartan] Rash  . Codeine Nausea And Vomiting, Swelling and Rash  . Tessalon Perles Rash  . Tramadol Nausea Only and Rash    PAST MEDICAL HISTORY Past Medical History:  Diagnosis Date  . Abdominal pain, unspecified site   . Abnormality of gait   . Apnea   . Cardiomegaly   . Chest pain, unspecified   . Complication of anesthesia    hard time waking up  . Depressive disorder, not elsewhere classified   . Diabetes mellitus   . Diabetic retinopathy ([place])   . Dizziness and giddiness   . Dyskinesia of esophagus   . Edema   . Extrinsic asthma, unspecified   . Female stress incontinence   . Gout, unspecified   . High cholesterol   . Hypertension   . Lipoma of other skin and subcutaneous tissue   . Lumbago   . Memory loss   . Migraine without aura, with intractable migraine, so stated, with status migrainosus   . Mild cognitive impairment, so stated   . Nonspecific (abnormal) findings on radiological and other examination of abdominal area, including retroperitoneum   . Nonspecific abnormal results of liver function study   . Obesity, unspecified   . Obstructive chronic bronchitis with exacerbation ([place])   . Other and unspecified hyperlipidemia   . Other B-complex deficiencies   . Other nonspecific abnormal serum enzyme levels   . Other specified cardiac dysrhythmias(427.89)   . Other specified disease of sebaceous glands   . Other symptoms involving cardiovascular system   . Pain in joint, ankle and foot   . Pain in joint, pelvic region and thigh   . Pain in joint, shoulder region   . Palpitations   . Reflux esophagitis   . Shortness of breath   . Tension headache   . Type I (juvenile type) diabetes mellitus without mention of complication, not stated as uncontrolled   . Type I (juvenile type) diabetes mellitus without mention  of complication, uncontrolled   . Unspecified essential hypertension   . Unspecified hypothyroidism   . Unspecified vitamin D deficiency   . Ventral hernia, unspecified, without mention of obstruction or gangrene    Past Surgical History:  Procedure Laterality Date  . ABDOMINAL HYSTERECTOMY    . APPENDECTOMY    . BREAST SURGERY    . EXPLORATORY LAPAROTOMY W/ BOWEL RESECTION  07/25/2004   PROCEDURE: Laparoscopy, open laparotomy, resection of jejunojejunostomy  .  INCISIONAL HERNIA REPAIR  03/19/2006   PROCEDURE: Open ventral hernia repair with mesh.  Marland Kitchen LAPAROSCOPIC GASTRIC BYPASS  07/22/2004   PROCEDURE: Laparoscopic Roux-en-Y gastric bypass, antecolic, antegastric,  . LAPAROSCOPIC INCISIONAL / UMBILICAL / VENTRAL HERNIA REPAIR  09/22/2005   PROCEDURE: Laparoscopic ventral hernia repair with mesh.  Marland Kitchen LEFT HEART CATH AND CORONARY ANGIOGRAPHY N/A 11/20/2016   Procedure: Left Heart Cath and Coronary Angiography;  Surgeon: Sherren Mocha, MD;  Location: [facility];  Service: Cardiovascular;  Laterality: N/A;  . LEFT HEART CATHETERIZATION WITH CORONARY ANGIOGRAM N/A 08/21/2014   Procedure: LEFT HEART CATHETERIZATION WITH CORONARY ANGIOGRAM;  Surgeon: Sherren Mocha, MD;  Location: [facility];  Service: Cardiovascular;  Laterality: N/A;  . stents     last time she said was 1 yr ago     FAMILY HISTORY Family History  Problem Relation Age of Onset  . Stroke Mother   . Stroke Father   . Diabetes Father   . Heart disease Father   . Diabetes Son   . Cancer Brother        BLADDER  . Diabetes Brother     SOCIAL HISTORY Social History   Tobacco Use  . Smoking status: Never Smoker  . Smokeless tobacco: Never Used  Vaping Use  . Vaping Use: Never used  Substance Use Topics  . Alcohol use: No  . Drug use: No         OPHTHALMIC EXAM:  Base Eye Exam    Visual Acuity (Snellen - Linear)      Right Left   Dist cc 20/25 20/25-2       Tonometry (Tonopen, 10:15 AM)       Right Left   Pressure 17 16       Pupils      Pupils Dark Light Shape React APD   Right PERRL 3 2 Round Slow None   Left PERRL 3 2 Round Slow None       Visual Fields (Counting fingers)      Left Right    Full Full       Extraocular Movement      Right Left    Full Full       Neuro/Psych    Oriented x3: Yes   Mood/Affect: Normal       Dilation    Both eyes: 1.0% Mydriacyl, 2.5% Phenylephrine @ 10:15 AM        Slit Lamp and Fundus Exam    External Exam      Right Left   External Normal Normal       Slit Lamp Exam      Right Left   Lids/Lashes Normal Normal   Conjunctiva/Sclera White and quiet White and quiet   Cornea Clear Clear   Anterior Chamber Deep and quiet Deep and quiet   Iris Round and reactive Round and reactive   Lens Posterior chamber intraocular lens, Centered posterior chamber intraocular lens Posterior chamber intraocular lens, Centered posterior chamber intraocular lens   Anterior Vitreous Normal Normal       Fundus Exam      Right Left   Posterior Vitreous Normal Normal   Disc Normal Normal   C/D Ratio 0.15 0.2   Macula Normal Epiretinal membrane   Vessels NPDR-Severe NPDR- Moderate   Periphery Normal Normal          IMAGING AND PROCEDURES  Imaging and Procedures for 12/05/19  OCT, Retina - OU - Both Eyes  Right Eye Quality was good. Scan locations included subfoveal. Central Foveal Thickness: 219. Progression has been stable.   Left Eye Quality was good. Scan locations included subfoveal. Central Foveal Thickness: 236. Progression has been stable. Findings include epiretinal membrane.   Notes OS mild epiretinal membrane nasal                ASSESSMENT/PLAN:  Macular pucker, left eye The nature of macular pucker (epiretinal membrane ERM) was discussed with the patient as well as threshold criteria for vitrectomy surgery. I explained that in rare cases another surgery is needed to actually remove a second  wrinkle should it regrow.  Most often, the epiretinal membrane and underlying wrinkled internal limiting membrane are removed with the first surgery, to accomplish the goals.   If the operative eye is Phakic (natural lens still present), cataract surgery is often recommended prior to Vitrectomy. This will enable the retina surgeon to have the best view during surgery and the patient to obtain optimal results in the future. Treatment options were discussed.  OS, no change over time and the nasal epiretinal membrane, will observe      ICD-10-CM   1. Macular pucker, left eye  H35.372 OCT, Retina - OU - Both Eyes  2. Severe nonproliferative diabetic retinopathy of right eye without macular edema associated with type 2 diabetes mellitus (HCC)  E11.3491 OCT, Retina - OU - Both Eyes    1.  Dr. Talbert Forest OU is scheduled  2.  No foveal distortion OS we will continue to observe  3.  Ophthalmic Meds Ordered this visit:  No orders of the defined types were placed in this encounter.      Return in about 6 months (around 06/06/2020) for DILATE OU, OCT.  There are no Patient Instructions on file for this visit.   Explained the diagnoses, plan, and follow up with the patient and they expressed understanding.  Patient expressed understanding of the importance of proper follow up care.   Clent Demark Ellysa Parrack M.D. Diseases & Surgery of the Retina and Vitreous Retina & Diabetic [facility] 12/05/19     Abbreviations: M myopia (nearsighted); A astigmatism; H hyperopia (farsighted); P presbyopia; Mrx spectacle prescription;  CTL contact lenses; OD right eye; OS left eye; OU both eyes  XT exotropia; ET esotropia; PEK punctate epithelial keratitis; PEE punctate epithelial erosions; DES dry eye syndrome; MGD meibomian gland dysfunction; ATs artificial tears; PFAT's preservative free artificial tears; [place] nuclear sclerotic cataract; PSC posterior subcapsular cataract; ERM epi-retinal membrane; PVD posterior vitreous  detachment; RD retinal detachment; DM diabetes mellitus; DR diabetic retinopathy; NPDR non-proliferative diabetic retinopathy; PDR proliferative diabetic retinopathy; CSME clinically significant macular edema; DME diabetic macular edema; dbh dot blot hemorrhages; CWS cotton wool spot; POAG primary open angle glaucoma; C/D cup-to-disc ratio; HVF humphrey visual field; GVF goldmann visual field; OCT optical coherence tomography; IOP intraocular pressure; BRVO Branch retinal vein occlusion; CRVO central retinal vein occlusion; CRAO central retinal artery occlusion; BRAO branch retinal artery occlusion; RT retinal tear; SB scleral buckle; PPV pars plana vitrectomy; VH Vitreous hemorrhage; PRP panretinal laser photocoagulation; IVK intravitreal kenalog; VMT vitreomacular traction; MH Macular hole;  NVD neovascularization of the disc; NVE neovascularization elsewhere; AREDS age related eye disease study; ARMD age related macular degeneration; POAG primary open angle glaucoma; EBMD epithelial/anterior basement membrane dystrophy; ACIOL anterior chamber intraocular lens; IOL intraocular lens; PCIOL posterior chamber intraocular lens; Phaco/IOL phacoemulsification with intraocular lens placement; PRK photorefractive keratectomy; LASIK laser assisted in situ keratomileusis; HTN hypertension; DM diabetes  mellitus; COPD chronic obstructive pulmonary disease

## 2019-12-05 NOTE — Assessment & Plan Note (Signed)
The nature of macular pucker (epiretinal membrane ERM) was discussed with the patient as well as threshold criteria for vitrectomy surgery. I explained that in rare cases another surgery is needed to actually remove a second wrinkle should it regrow.  Most often, the epiretinal membrane and underlying wrinkled internal limiting membrane are removed with the first surgery, to accomplish the goals.   If the operative eye is Phakic (natural lens still present), cataract surgery is often recommended prior to Vitrectomy. This will enable the retina surgeon to have the best view during surgery and the patient to obtain optimal results in the future. Treatment options were discussed.  OS, no change over time and the nasal epiretinal membrane, will observe

## 2019-12-21 ENCOUNTER — Other Ambulatory Visit: Payer: Self-pay | Admitting: Internal Medicine

## 2019-12-21 ENCOUNTER — Telehealth: Payer: Self-pay | Admitting: Cardiovascular Disease

## 2019-12-21 ENCOUNTER — Telehealth: Payer: Self-pay

## 2019-12-21 NOTE — Telephone Encounter (Signed)
The patient was instructed to STOP AMLODIPINE about 2 weeks ago due to leg swelling. Her leg swelling did not improve at all, but her BP went up to about 180/100.  She restarted amlodipine 2.5 mg daily a couple days ago and she is now running 130s/90s.  She understands the Pharmacist will be consulted about medication recommendations and a Triage nurse will call her tomorrow.  She was grateful for assistance.

## 2019-12-21 NOTE — Telephone Encounter (Signed)
Called pt and pt stated that her BP has been high and she started Amlodipine back and that her legs are still swollen and pt requested a refill on amlodipine. I told pt that this medication was D/C by Dr. Burt Knack and that I needed to send Dr. Burt Knack and his nurse a message. Please address

## 2019-12-21 NOTE — Telephone Encounter (Signed)
Erica with [facility] called stating she tried to contact patient about scheduling Carotid U/S and patient stated she was unaware of why that was order.  Danae Chen also mention that someone had noted on the appointment desk that patient had Carotid U/S elsewhere. I informed Danae Chen that someone from her location documented that for we do not have anyone with the initials clc.    Danae Chen is aware I will follow-up with patient and Dr.Reed then circle back to her with a status update.   Left message on voicemail for patient to return call when available

## 2019-12-21 NOTE — Telephone Encounter (Signed)
Spoke with patient, patient states she did not refuse to schedule.  Patient states she had informed Danae Chen to go ahead and schedule and Danae Chen said she would follow-up with Dr.Reed first. I asked patient about the notation that is on her appt desk stating she had already had this done made on 12/14/19 and she mentioned that she did not tell anyone that for she knows it has not been scheduled yet.  Patient inquired about the reason for the exam, I informed patient of diagnosis associated, and stated she does not believe them to be appropriate for the fall was over a month ago, the visual changes have resolved, and the vertigo is a chronic condition, yet she would still like to get exam because Dr.Reed ordered it so it must be necessary. Patient states she still has leg swelling.   I called Erica back and left detailed message informing her to proceed with scheduling exam.

## 2019-12-21 NOTE — Telephone Encounter (Signed)
*  STAT* If patient is at the pharmacy, call can be transferred to refill team.   1. Which medications need to be refilled? (please list name of each medication and dose if known) amLODipine (NORVASC) 2.5 MG tablet   2. Which pharmacy/location (including street and city if local pharmacy) is medication to be sent to? [id number] - [place], [place]  3. Do they need a 30 day or 90 day supply? 90 day

## 2019-12-22 ENCOUNTER — Telehealth: Payer: Self-pay | Admitting: Cardiovascular Disease

## 2019-12-22 MED ORDER — AMLODIPINE BESYLATE 2.5 MG PO TABS
2.5000 mg | ORAL_TABLET | Freq: Every day | ORAL | 1 refills | Status: DC
Start: 2019-12-22 — End: 2020-01-08

## 2019-12-22 NOTE — Telephone Encounter (Signed)
Pt returning call. She reports continued swelling on low dose Amlodipine.  She is requesting a different HTN medication. Called and cancelled Amlodipine refill sent in by pharmD. Forwarding note back to pharmD to advise on new medication. Pt aware we will call her once advised on, she is agreeable to plan.

## 2019-12-22 NOTE — Telephone Encounter (Signed)
Amlodipine 2.5mg  Rx sent to prefer pharmacy.

## 2019-12-22 NOTE — Telephone Encounter (Signed)
[facility] is returning Ivy's call. Transferred the call to triage per Ivy's note.

## 2019-12-22 NOTE — Telephone Encounter (Signed)
LMOM;  Due to elevated blood pressure and multiple medication changes in the last 4 weeks, will recommend visit to HTN clinic (pharmacist) for complete assessment and recommendations.

## 2019-12-22 NOTE — Telephone Encounter (Signed)
Left a message for the pt to call the office back and request to speak with a triage nurse, to endorse new amlodipine dose, per Pharmacy, and as discussed with the pt yesterday by Dr. Antionette Char RN.

## 2019-12-25 ENCOUNTER — Other Ambulatory Visit: Payer: Medicare Other

## 2019-12-27 ENCOUNTER — Ambulatory Visit: Payer: Medicare Other

## 2020-01-04 NOTE — Telephone Encounter (Signed)
Looks like I left a message. Patient was scheduled for HTN clinic and then she called to cancelled the appointment because her "BP was good".

## 2020-01-08 ENCOUNTER — Other Ambulatory Visit: Payer: Self-pay

## 2020-01-08 MED ORDER — AMLODIPINE BESYLATE 2.5 MG PO TABS: 2.5000 mg | ORAL_TABLET | Freq: Every day | ORAL | 2 refills | Status: DC

## 2020-01-08 NOTE — Telephone Encounter (Signed)
Pt's medication was sent to pt's pharmacy as requested. Confirmation received.  °

## 2020-01-09 ENCOUNTER — Ambulatory Visit
Admission: RE | Admit: 2020-01-09 | Discharge: 2020-01-09 | Disposition: A | Discharge status: Home / Self Care | Payer: Medicare Other | Source: Ambulatory Visit | Attending: Internal Medicine | Admitting: Internal Medicine

## 2020-01-09 DIAGNOSIS — R42 Dizziness and giddiness: Secondary | ICD-10-CM | POA: Diagnosis not present

## 2020-01-09 DIAGNOSIS — I771 Stricture of artery: Secondary | ICD-10-CM | POA: Diagnosis not present

## 2020-01-09 DIAGNOSIS — R296 Repeated falls: Secondary | ICD-10-CM

## 2020-01-09 DIAGNOSIS — I6523 Occlusion and stenosis of bilateral carotid arteries: Secondary | ICD-10-CM | POA: Diagnosis not present

## 2020-01-09 DIAGNOSIS — H539 Unspecified visual disturbance: Secondary | ICD-10-CM

## 2020-01-10 NOTE — Progress Notes (Signed)
No significant blockage in her arteries of her neck.

## 2020-01-23 DIAGNOSIS — E119 Type 2 diabetes mellitus without complications: Secondary | ICD-10-CM | POA: Diagnosis not present

## 2020-01-23 DIAGNOSIS — Z961 Presence of intraocular lens: Secondary | ICD-10-CM | POA: Diagnosis not present

## 2020-01-23 DIAGNOSIS — H40053 Ocular hypertension, bilateral: Secondary | ICD-10-CM | POA: Diagnosis not present

## 2020-01-23 DIAGNOSIS — H35372 Puckering of macula, left eye: Secondary | ICD-10-CM | POA: Diagnosis not present

## 2020-01-23 DIAGNOSIS — H16223 Keratoconjunctivitis sicca, not specified as Sjogren's, bilateral: Secondary | ICD-10-CM | POA: Diagnosis not present

## 2020-02-06 ENCOUNTER — Other Ambulatory Visit: Payer: Self-pay

## 2020-02-06 ENCOUNTER — Other Ambulatory Visit: Payer: Medicare Other

## 2020-02-06 DIAGNOSIS — E11649 Type 2 diabetes mellitus with hypoglycemia without coma: Secondary | ICD-10-CM

## 2020-02-07 LAB — CBC WITH DIFFERENTIAL/PLATELET
Absolute Monocytes: 540 cells/uL (ref 200–950)
Basophils Absolute: 83 cells/uL (ref 0–200)
Basophils Relative: 1 %
Eosinophils Absolute: 141 cells/uL (ref 15–500)
Eosinophils Relative: 1.7 %
HCT: 42.7 % (ref 35.0–45.0)
Hemoglobin: 13.8 g/dL (ref 11.7–15.5)
Lymphs Abs: 1926 cells/uL (ref 850–3900)
MCH: 29.7 pg (ref 27.0–33.0)
MCHC: 32.3 g/dL (ref 32.0–36.0)
MCV: 92 fL (ref 80.0–100.0)
MPV: 11.5 fL (ref 7.5–12.5)
Monocytes Relative: 6.5 %
Neutro Abs: 5611 cells/uL (ref 1500–7800)
Neutrophils Relative %: 67.6 %
Platelets: 368 10*3/uL (ref 140–400)
RBC: 4.64 10*6/uL (ref 3.80–5.10)
RDW: 12.7 % (ref 11.0–15.0)
Total Lymphocyte: 23.2 %
WBC: 8.3 10*3/uL (ref 3.8–10.8)

## 2020-02-07 LAB — BASIC METABOLIC PANEL
BUN/Creatinine Ratio: 19 (calc) (ref 6–22)
BUN: 19 mg/dL (ref 7–25)
CO2: 30 mmol/L (ref 20–32)
Calcium: 9.5 mg/dL (ref 8.6–10.4)
Chloride: 100 mmol/L (ref 98–110)
Creat: 1.01 mg/dL — ABNORMAL HIGH (ref 0.60–0.88)
Glucose, Bld: 212 mg/dL — ABNORMAL HIGH (ref 65–99)
Potassium: 4.2 mmol/L (ref 3.5–5.3)
Sodium: 140 mmol/L (ref 135–146)

## 2020-02-07 LAB — HEMOGLOBIN A1C
Hgb A1c MFr Bld: 6.9 % of total Hgb — ABNORMAL HIGH (ref <5.7)
Mean Plasma Glucose: 151 (calc)
eAG (mmol/L): 8.4 (calc)

## 2020-02-07 NOTE — Progress Notes (Signed)
Sugar average has trended up but remains in lower end of diabetic range. Kidney function continues to decline likely from inadequate hydration and rising sugars. Blood counts are normal. Will discuss at appt.

## 2020-02-08 ENCOUNTER — Ambulatory Visit (INDEPENDENT_AMBULATORY_CARE_PROVIDER_SITE_OTHER): Payer: Medicare Other | Admitting: Internal Medicine

## 2020-02-08 ENCOUNTER — Other Ambulatory Visit: Payer: Self-pay

## 2020-02-08 ENCOUNTER — Encounter: Payer: Self-pay | Admitting: Internal Medicine

## 2020-02-08 VITALS — BP 128/68 | HR 76 | Temp 97.7°F | Ht 62.0 in | Wt 199.1 lb

## 2020-02-08 DIAGNOSIS — I25119 Atherosclerotic heart disease of native coronary artery with unspecified angina pectoris: Secondary | ICD-10-CM

## 2020-02-08 DIAGNOSIS — I10 Essential (primary) hypertension: Secondary | ICD-10-CM

## 2020-02-08 DIAGNOSIS — R2681 Unsteadiness on feet: Secondary | ICD-10-CM | POA: Diagnosis not present

## 2020-02-08 DIAGNOSIS — Z23 Encounter for immunization: Secondary | ICD-10-CM

## 2020-02-08 DIAGNOSIS — M216X1 Other acquired deformities of right foot: Secondary | ICD-10-CM

## 2020-02-08 DIAGNOSIS — E1122 Type 2 diabetes mellitus with diabetic chronic kidney disease: Secondary | ICD-10-CM | POA: Diagnosis not present

## 2020-02-08 DIAGNOSIS — M7989 Other specified soft tissue disorders: Secondary | ICD-10-CM

## 2020-02-08 DIAGNOSIS — E1121 Type 2 diabetes mellitus with diabetic nephropathy: Secondary | ICD-10-CM

## 2020-02-08 MED ORDER — INSULIN NPH (HUMAN) (ISOPHANE) 100 UNIT/ML ~~LOC~~ SUSP: SUBCUTANEOUS | 3 refills | Status: DC

## 2020-02-08 MED ORDER — OZEMPIC (1 MG/DOSE) 2 MG/1.5ML ~~LOC~~ SOPN: 1.0000 mg | PEN_INJECTOR | SUBCUTANEOUS | 5 refills | Status: DC

## 2020-02-08 NOTE — Progress Notes (Signed)
Location:  [facility] clinic Provider:  Fannie Gathright L. Mariea Clonts, D.O., C.M.D.  Code Status: DNR Goals of Care:  Advanced Directives 02/08/2020  Does Patient Have a Medical Advance Directive? Yes  Type of Advance Directive Out of facility DNR (pink MOST or yellow form)  Does patient want to make changes to medical advance directive? No - Patient declined  Copy of [place] in Chart? -  Would patient like information on creating a medical advance directive? -  Pre-existing out of facility DNR order (yellow form or pink MOST form) -   Chief Complaint  Patient presents with  . Medical Management of Chronic Issues    3 month follow up   . Health Maintenance    Influenza (not sure abut it this year) refuses to take covid vaccine   . Acute Visit    would like to up her Insulin , swelling in legs     HPI: Patient is a 79 y.o. female seen today for medical management of chronic diseases.    Has not had covid vaccine.  Had two friends die at some point soon after their vaccines (sounds like they had other illnesses and one got covid early before the vaccine kicked in).    Flu shot:    Her hba1c is 6.9.  She's on glipizide, NPH insulin 30 bid.  She is on lipitor and asa and an ARB.  The day she came up here it was 400stg after eating just after her labs were done.  CBG avg 156 in her machine.  She's been using 35-40 units bid at times.  Wants to try ozempic now weekly.  Needs a new right ankle soft cast.  Gotten from ortho before so she's going to call them back for another b/c the bottom is wearing out.  She still is wondering about the swelling in her legs, mostly left thigh and proximal part of lower leg.  We checked her arterial and venous circulation.  She is wondering about going back to Dr. Burt Knack for reassessment.    She also c/o unsteady gait when she stands and during walking.  Not spinning, not lightheaded.    Past Medical History:  Diagnosis Date  . Abdominal pain,  unspecified site   . Abnormality of gait   . Apnea   . Cardiomegaly   . Chest pain, unspecified   . Complication of anesthesia    hard time waking up  . Depressive disorder, not elsewhere classified   . Diabetes mellitus   . Diabetic retinopathy ([place])   . Dizziness and giddiness   . Dyskinesia of esophagus   . Edema   . Extrinsic asthma, unspecified   . Female stress incontinence   . Gout, unspecified   . High cholesterol   . Hypertension   . Lipoma of other skin and subcutaneous tissue   . Lumbago   . Memory loss   . Migraine without aura, with intractable migraine, so stated, with status migrainosus   . Mild cognitive impairment, so stated   . Nonspecific (abnormal) findings on radiological and other examination of abdominal area, including retroperitoneum   . Nonspecific abnormal results of liver function study   . Obesity, unspecified   . Obstructive chronic bronchitis with exacerbation ([place])   . Other and unspecified hyperlipidemia   . Other B-complex deficiencies   . Other nonspecific abnormal serum enzyme levels   . Other specified cardiac dysrhythmias(427.89)   . Other specified disease of sebaceous glands   .  Other symptoms involving cardiovascular system   . Pain in joint, ankle and foot   . Pain in joint, pelvic region and thigh   . Pain in joint, shoulder region   . Palpitations   . Reflux esophagitis   . Shortness of breath   . Tension headache   . Type I (juvenile type) diabetes mellitus without mention of complication, not stated as uncontrolled   . Type I (juvenile type) diabetes mellitus without mention of complication, uncontrolled   . Unspecified essential hypertension   . Unspecified hypothyroidism   . Unspecified vitamin D deficiency   . Ventral hernia, unspecified, without mention of obstruction or gangrene     Past Surgical History:  Procedure Laterality Date  . ABDOMINAL HYSTERECTOMY    . APPENDECTOMY    . BREAST SURGERY    . EXPLORATORY  LAPAROTOMY W/ BOWEL RESECTION  07/25/2004   PROCEDURE: Laparoscopy, open laparotomy, resection of jejunojejunostomy  . INCISIONAL HERNIA REPAIR  03/19/2006   PROCEDURE: Open ventral hernia repair with mesh.  Marland Kitchen LAPAROSCOPIC GASTRIC BYPASS  07/22/2004   PROCEDURE: Laparoscopic Roux-en-Y gastric bypass, antecolic, antegastric,  . LAPAROSCOPIC INCISIONAL / UMBILICAL / VENTRAL HERNIA REPAIR  09/22/2005   PROCEDURE: Laparoscopic ventral hernia repair with mesh.  Marland Kitchen LEFT HEART CATH AND CORONARY ANGIOGRAPHY N/A 11/20/2016   Procedure: Left Heart Cath and Coronary Angiography;  Surgeon: Sherren Mocha, MD;  Location: [facility];  Service: Cardiovascular;  Laterality: N/A;  . LEFT HEART CATHETERIZATION WITH CORONARY ANGIOGRAM N/A 08/21/2014   Procedure: LEFT HEART CATHETERIZATION WITH CORONARY ANGIOGRAM;  Surgeon: Sherren Mocha, MD;  Location: [facility] CATH LAB;  Service: Cardiovascular;  Laterality: N/A;  . stents     last time she said was 1 yr ago     Allergies  Allergen Reactions  . Ciprofloxacin Rash  . Advair Diskus [Fluticasone-Salmeterol] Other (See Comments)    Throat closes  . Albuterol     REACTION: closes throat  . Alendronate Sodium Itching  . Benadryl [Diphenhydramine Hcl] Itching  . Benzonatate     REACTION: rash/hives  . Cephalexin Nausea Only  . Gabapentin Other (See Comments)    Loss of memory  . Iohexol      Desc: rash and DIF BREATHING   . Keflex [Cephalexin] Nausea And Vomiting  . Morphine Nausea And Vomiting  . Other Other (See Comments)    Decongestants- keeps pt awake at night, increased heart rate  . Propoxyphene Hcl     Doesn't recall  . Reclast [Zoledronic Acid] Other (See Comments)    Chest pain  . Avapro [Irbesartan] Rash  . Codeine Nausea And Vomiting, Swelling and Rash  . Tessalon Perles Rash  . Tramadol Nausea Only and Rash    Outpatient Encounter Medications as of 02/08/2020  Medication Sig  . amLODipine (NORVASC) 2.5 MG tablet Take 1 tablet  (2.5 mg total) by mouth daily.  Marland Kitchen aspirin 81 MG tablet Take 81 mg by mouth daily.   Marland Kitchen atorvastatin (LIPITOR) 20 MG tablet Take 1 tablet (20 mg total) by mouth daily.  . Calcium Citrate-Vitamin D (CALCIUM CITRATE + D PO) Take by mouth daily.  . cetirizine (ZYRTEC) 10 MG tablet Take 10 mg by mouth daily as needed for allergies. For allergies  . Cholecalciferol (VITAMIN D3) 5000 UNITS CAPS Take 1 tablet by mouth daily. For supplement  . CINNAMON PO Take 1,000 mg by mouth daily.  . citalopram (CELEXA) 40 MG tablet TAKE 1 TABLET BY MOUTH EVERY DAY FOR DEPRESSION  .  Continuous Blood Gluc Receiver (FREESTYLE LIBRE 14 DAY READER) DEVI CHECK BLOOD SUGAR 4 TIMES DAILY (FASTING AND WITH EACH MEAL) Dx: E11.9  . Continuous Blood Gluc Sensor (FREESTYLE LIBRE 14 DAY SENSOR) MISC Inject 1 Units into the skin 2 (two) times daily.  Marland Kitchen docusate sodium (COLACE) 100 MG capsule Take 100 mg by mouth 2 (two) times daily.  . ferrous sulfate 325 (65 FE) MG tablet Take 325 mg by mouth 2 (two) times daily with a meal.  . furosemide (LASIX) 20 MG tablet Take 20 mg by mouth.  Marland Kitchen glipiZIDE (GLUCOTROL XL) 10 MG 24 hr tablet TAKE ONE TABLET BY MOUTH IN THE MORNING AND ONE TABLET IN THE EVENING TO CONTROL BLOOD SUGAR  . insulin NPH Human (HUMULIN N) 100 UNIT/ML injection INJECT 30 UNITS IN THE MORNING AND 30 UNITS IN THE EVENING  . Insulin Syringes, Disposable, U-100 1 ML MISC Use to inject insulin Dx: F79.0240  . isosorbide mononitrate (IMDUR) 30 MG 24 hr tablet Take 1 tablet (30 mg total) by mouth daily.  Marland Kitchen losartan-hydrochlorothiazide (HYZAAR) 50-12.5 MG tablet TAKE 1 TABLET BY MOUTH EVERY DAY FOR BLOOD PRESSURE  . Magnesium Oxide 250 MG TABS Take 500 mg by mouth daily.   . meclizine (ANTIVERT) 25 MG tablet Take 1 tablet (25 mg total) by mouth 3 (three) times daily. For dizziness  . nitroGLYCERIN (NITROSTAT) 0.4 MG SL tablet Place 0.4 mg under the tongue every 5 (five) minutes as needed for chest pain.  Marland Kitchen omeprazole (PRILOSEC)  20 MG capsule TAKE 1 CAPSULE BY MOUTH EVERY DAY  . predniSONE (DELTASONE) 5 MG tablet TAKE 1 TABLET BY MOUTH EVERY DAY IN THE MORNING TO REDUCE PAIN  . vitamin C (ASCORBIC ACID) 500 MG tablet Take 500 mg by mouth daily.   No facility-administered encounter medications on file as of 02/08/2020.    Review of Systems:  Review of Systems  Constitutional: Negative for chills and fever.  HENT: Negative for congestion and sore throat.   Eyes: Positive for blurred vision.  Respiratory: Negative for cough and shortness of breath.   Cardiovascular: Negative for chest pain, palpitations and leg swelling.  Gastrointestinal: Negative for abdominal pain, blood in stool, constipation, diarrhea and melena.  Genitourinary: Negative for dysuria.  Musculoskeletal:       Right foot turns out after fx of ankle in past  Skin: Negative for itching and rash.  Neurological: Negative for dizziness and weakness.  Endo/Heme/Allergies: Bruises/bleeds easily.  Psychiatric/Behavioral: Positive for memory loss.       Starting to have difficulty finding words and some short-term memory loss    Health Maintenance  Topic Date Due  . INFLUENZA VACCINE  12/31/2019  . HEMOGLOBIN A1C  08/05/2020  . FOOT EXAM  11/05/2020  . OPHTHALMOLOGY EXAM  12/04/2020  . TETANUS/TDAP  11/11/2021  . DEXA SCAN  Completed  . PNA vac Low Risk Adult  Completed  . COVID-19 Vaccine  Discontinued    Physical Exam: Vitals:   02/08/20 0943  BP: 128/68  Pulse: 76  Temp: 97.7 F (36.5 C)  SpO2: 96%  Weight: 199 lb 1.6 oz (90.3 kg)  Height: 5\' 2"  (1.575 m)   Body mass index is 36.42 kg/m. Physical Exam Vitals reviewed.  Constitutional:      General: She is not in acute distress.    Appearance: Normal appearance. She is not toxic-appearing.  HENT:     Head: Normocephalic and atraumatic.     Right Ear: There is no impacted cerumen.  Left Ear: There is no impacted cerumen.  Cardiovascular:     Rate and Rhythm: Normal rate  and regular rhythm.  Pulmonary:     Effort: Pulmonary effort is normal.     Breath sounds: Normal breath sounds. No wheezing, rhonchi or rales.  Abdominal:     General: Bowel sounds are normal.     Comments: Chronic large hernia  Musculoskeletal:        General: Normal range of motion.     Right lower leg: No edema.     Left lower leg: No edema.     Comments: Prominence of upper part of calf on left, but ruled out months ago for DVT   Neurological:     General: No focal deficit present.     Mental Status: She is alert and oriented to person, place, and time.     Gait: Gait abnormal.     Comments: Using cane here today  Psychiatric:        Mood and Affect: Mood normal.     Labs reviewed: Basic Metabolic Panel: Recent Labs    04/20/19 0949 11/01/19 0858 02/06/20 0808  NA 143 141 140  K 3.6 3.9 4.2  CL 105 101 100  CO2 25 31 30   GLUCOSE 77 166* 212*  BUN 19 22 19   CREATININE 0.89* 0.97* 1.01*  CALCIUM 9.2 9.5 9.5   Liver Function Tests: Recent Labs    11/01/19 0858  AST 15  ALT 22  BILITOT 0.6  PROT 6.2   No results for input(s): LIPASE, AMYLASE in the last 8760 hours. No results for input(s): AMMONIA in the last 8760 hours. CBC: Recent Labs    04/20/19 0949 11/01/19 0858 02/06/20 0808  WBC 8.3 8.8 8.3  NEUTROABS 5,412 5,843 5,611  HGB 13.1 14.2 13.8  HCT 40.1 44.3 42.7  MCV 92.0 91.2 92.0  PLT 330 340 368   Lipid Panel: Recent Labs    04/20/19 0949 11/01/19 0858  CHOL 140 149  HDL 45* 49*  LDLCALC 69 69  TRIG 191* 215*  CHOLHDL 3.1 3.0   Lab Results  Component Value Date   HGBA1C 6.9 (H) 02/06/2020    Procedures since last visit: US Carotid Bilateral  Result Date: 01/09/2020 CLINICAL DATA:  Frequent falls.  Vertigo. EXAM: BILATERAL CAROTID DUPLEX ULTRASOUND TECHNIQUE: Pearline Cables scale imaging, color Doppler and duplex ultrasound were performed of bilateral carotid and vertebral arteries in the neck. COMPARISON:  None. FINDINGS: Criteria:  Quantification of carotid stenosis is based on velocity parameters that correlate the residual internal carotid diameter with NASCET-based stenosis levels, using the diameter of the distal internal carotid lumen as the denominator for stenosis measurement. The following velocity measurements were obtained: RIGHT ICA: 100 cm/sec CCA: 84 cm/sec SYSTOLIC ICA/CCA RATIO:  1.2 ECA: 81 cm/sec LEFT ICA: 98 cm/sec CCA: 82 cm/sec SYSTOLIC ICA/CCA RATIO:  1.2 ECA: 77 cm/sec RIGHT CAROTID ARTERY: Minimal atherosclerotic changes are noted, greatest at the level of the carotid bulb. RIGHT VERTEBRAL ARTERY:  Antegrade flow is noted. LEFT CAROTID ARTERY: Minimal atherosclerotic changes are noted, greatest at the level of the carotid bulb. The left ICA is tortuous. LEFT VERTEBRAL ARTERY:  Antegrade flow is noted. Upper extremity blood pressures: RIGHT: 137/71 LEFT: 146/68 IMPRESSION: 1. Less than 50% stenosis of the bilateral ICAs. 2. Antegrade flow is noted within both vertebral arteries. Electronically Signed   By: Constance Holster M.D.   On: 01/09/2020 18:40    Assessment/Plan 1. Controlled diabetes mellitus with nephropathy ([place]) -  has been using 35 or 40 units of insulin instead of 30 units so will update order - stop glipizide due to low risk, start ozempic instead weekly - insulin NPH Human (HUMULIN N) 100 UNIT/ML injection; INJECT 40 UNITS IN THE MORNING AND 40 UNITS IN THE EVENING  Dispense: 80 mL; Refill: 3 - Semaglutide, 1 MG/DOSE, (OZEMPIC, 1 MG/DOSE,) 2 MG/1.5ML SOPN; Inject 1 mg into the skin once a week.  Dispense: 6 mL; Refill: 5  2. Coronary artery disease involving native coronary artery of native heart with angina pectoris ([place]) - requests f/u with Dr. Janan Halter her left leg is swollen - Ambulatory referral to Cardiology  3. Left leg swelling - ruled out dvt, seems it's a permanent deformity - Ambulatory referral to Cardiology  4. Essential hypertension, benign -bp is controlled, cont  same  5. Unsteady gait -continue cane, wheelchair for long distances  6. Acquired abduction deformity of right foot -will get new soft cast thru ortho  7. Need for influenza vaccination -high dose given today  Labs/tests ordered:   Lab Orders     CBC with Differential/Platelet     COMPLETE METABOLIC PANEL WITH GFR     Lipid panel     Hemoglobin A1c  Next appt:  06/10/2020   Nasim Habeeb L. Terriana Barreras, D.O. [facility] [address], [place] [address] Cell Phone (Mon-Fri 8am-5pm):  [phone] On Call:  [phone] & follow prompts after 5pm & weekends Office Phone:  [phone] Office Fax:  [phone]

## 2020-02-09 ENCOUNTER — Telehealth: Payer: Self-pay | Admitting: Cardiovascular Disease

## 2020-02-09 NOTE — Telephone Encounter (Signed)
Scheduled patient this Tuesday, 9/14 with Dr. Burt Knack. She was grateful for assistance.

## 2020-02-09 NOTE — Telephone Encounter (Signed)
As indicated in appt notes, pt is returning a call back to [facility] in scheduling, to schedule an appt with Dr. Burt Knack.  Will forward this message to [facility] in scheduling and Dr. Antionette Char RN, for further follow-up with the pt.    02/08/20 3:20pm   Left message to schedule referral appt. Pt is established with Dr Burt Knack. -MA

## 2020-02-09 NOTE — Telephone Encounter (Signed)
Patient states she is returning a call. However, no current notes or results are available.

## 2020-02-13 ENCOUNTER — Encounter: Payer: Self-pay | Admitting: Cardiovascular Disease

## 2020-02-13 ENCOUNTER — Other Ambulatory Visit: Payer: Self-pay

## 2020-02-13 ENCOUNTER — Ambulatory Visit (INDEPENDENT_AMBULATORY_CARE_PROVIDER_SITE_OTHER): Payer: Medicare Other | Admitting: Cardiovascular Disease

## 2020-02-13 VITALS — BP 116/66 | HR 79 | Ht 62.0 in | Wt 200.0 lb

## 2020-02-13 DIAGNOSIS — M7989 Other specified soft tissue disorders: Secondary | ICD-10-CM

## 2020-02-13 DIAGNOSIS — E782 Mixed hyperlipidemia: Secondary | ICD-10-CM

## 2020-02-13 DIAGNOSIS — I2510 Atherosclerotic heart disease of native coronary artery without angina pectoris: Secondary | ICD-10-CM

## 2020-02-13 DIAGNOSIS — I5181 Takotsubo syndrome: Secondary | ICD-10-CM | POA: Diagnosis not present

## 2020-02-13 DIAGNOSIS — I10 Essential (primary) hypertension: Secondary | ICD-10-CM

## 2020-02-13 DIAGNOSIS — I25119 Atherosclerotic heart disease of native coronary artery with unspecified angina pectoris: Secondary | ICD-10-CM | POA: Diagnosis not present

## 2020-02-13 DIAGNOSIS — R0602 Shortness of breath: Secondary | ICD-10-CM

## 2020-02-13 NOTE — Progress Notes (Signed)
Cardiology Office Note:    Date:  02/13/2020   ID:  [name], DOB 1940-07-13, MRN [id number]  PCP:  Gayland Curry, DO  [place] Cardiologist:  Sherren Mocha, MD  [place] Electrophysiologist:  None   Referring MD: Gayland Curry, DO   Chief Complaint  Patient presents with  . Leg Swelling    History of Present Illness:    [name] is a 79 y.o. female with a hx of Takotsubo Syndrome in 2016, presenting for follow-up evaluation. Other cardiovascular problems include nonobstructive CAD, HTN, and mixed hyperlipidemia. She had full recovery of LV function with LVEF 65% on follow-up echo imaging.   The patient is here alone today. She complains of left leg swelling. This has been evaluated with duplex ultrasound which was negative for DVT. She has no tenderness or pain in the leg. She complains of shortness of breath with activity as well. Thinks this is worse than in the past. Also reports intermittent central chest discomfort across the lower chest/epigastrium. This has been self-limited. The discomfort feels better when she stretches. It is non-exertional. She complains of heart palpitations.   Past Medical History:  Diagnosis Date  . Abdominal pain, unspecified site   . Abnormality of gait   . Apnea   . Cardiomegaly   . Chest pain, unspecified   . Complication of anesthesia    hard time waking up  . Depressive disorder, not elsewhere classified   . Diabetes mellitus   . Diabetic retinopathy ([place])   . Dizziness and giddiness   . Dyskinesia of esophagus   . Edema   . Extrinsic asthma, unspecified   . Female stress incontinence   . Gout, unspecified   . High cholesterol   . Hypertension   . Lipoma of other skin and subcutaneous tissue   . Lumbago   . Memory loss   . Migraine without aura, with intractable migraine, so stated, with status migrainosus   . Mild cognitive impairment, so stated   . Nonspecific (abnormal) findings on radiological and other  examination of abdominal area, including retroperitoneum   . Nonspecific abnormal results of liver function study   . Obesity, unspecified   . Obstructive chronic bronchitis with exacerbation ([place])   . Other and unspecified hyperlipidemia   . Other B-complex deficiencies   . Other nonspecific abnormal serum enzyme levels   . Other specified cardiac dysrhythmias(427.89)   . Other specified disease of sebaceous glands   . Other symptoms involving cardiovascular system   . Pain in joint, ankle and foot   . Pain in joint, pelvic region and thigh   . Pain in joint, shoulder region   . Palpitations   . Reflux esophagitis   . Shortness of breath   . Tension headache   . Type I (juvenile type) diabetes mellitus without mention of complication, not stated as uncontrolled   . Type I (juvenile type) diabetes mellitus without mention of complication, uncontrolled   . Unspecified essential hypertension   . Unspecified hypothyroidism   . Unspecified vitamin D deficiency   . Ventral hernia, unspecified, without mention of obstruction or gangrene     Past Surgical History:  Procedure Laterality Date  . ABDOMINAL HYSTERECTOMY    . APPENDECTOMY    . BREAST SURGERY    . EXPLORATORY LAPAROTOMY W/ BOWEL RESECTION  07/25/2004   PROCEDURE: Laparoscopy, open laparotomy, resection of jejunojejunostomy  . INCISIONAL HERNIA REPAIR  03/19/2006   PROCEDURE: Open ventral hernia repair  with mesh.  Marland Kitchen LAPAROSCOPIC GASTRIC BYPASS  07/22/2004   PROCEDURE: Laparoscopic Roux-en-Y gastric bypass, antecolic, antegastric,  . LAPAROSCOPIC INCISIONAL / UMBILICAL / VENTRAL HERNIA REPAIR  09/22/2005   PROCEDURE: Laparoscopic ventral hernia repair with mesh.  Marland Kitchen LEFT HEART CATH AND CORONARY ANGIOGRAPHY N/A 11/20/2016   Procedure: Left Heart Cath and Coronary Angiography;  Surgeon: Sherren Mocha, MD;  Location: [facility];  Service: Cardiovascular;  Laterality: N/A;  . LEFT HEART CATHETERIZATION WITH CORONARY  ANGIOGRAM N/A 08/21/2014   Procedure: LEFT HEART CATHETERIZATION WITH CORONARY ANGIOGRAM;  Surgeon: Sherren Mocha, MD;  Location: [facility] CATH [facility];  Service: Cardiovascular;  Laterality: N/A;  . stents     last time she said was 1 yr ago     Current Medications: Current Meds  Medication Sig  . amLODipine (NORVASC) 2.5 MG tablet Take 1 tablet (2.5 mg total) by mouth daily.  Marland Kitchen aspirin 81 MG tablet Take 81 mg by mouth daily.   Marland Kitchen atorvastatin (LIPITOR) 20 MG tablet Take 1 tablet (20 mg total) by mouth daily.  . Calcium Citrate-Vitamin D (CALCIUM CITRATE + D PO) Take by mouth daily.  . cetirizine (ZYRTEC) 10 MG tablet Take 10 mg by mouth daily as needed for allergies. For allergies  . Cholecalciferol (VITAMIN D3) 5000 UNITS CAPS Take 1 tablet by mouth daily. For supplement  . CINNAMON PO Take 1,000 mg by mouth daily.  . citalopram (CELEXA) 40 MG tablet TAKE 1 TABLET BY MOUTH EVERY DAY FOR DEPRESSION  . Continuous Blood Gluc Receiver (FREESTYLE LIBRE 14 DAY READER) DEVI CHECK BLOOD SUGAR 4 TIMES DAILY (FASTING AND WITH EACH MEAL) Dx: E11.9  . Continuous Blood Gluc Sensor (FREESTYLE LIBRE 14 DAY SENSOR) MISC Inject 1 Units into the skin 2 (two) times daily.  Marland Kitchen docusate sodium (COLACE) 100 MG capsule Take 100 mg by mouth 2 (two) times daily.  . ferrous sulfate 325 (65 FE) MG tablet Take 325 mg by mouth 2 (two) times daily with a meal.  . furosemide (LASIX) 20 MG tablet Take 20 mg by mouth.  . insulin NPH Human (HUMULIN N) 100 UNIT/ML injection INJECT 40 UNITS IN THE MORNING AND 40 UNITS IN THE EVENING  . Insulin Syringes, Disposable, U-100 1 ML MISC Use to inject insulin Dx: G31.5176  . isosorbide mononitrate (IMDUR) 30 MG 24 hr tablet Take 1 tablet (30 mg total) by mouth daily.  Marland Kitchen losartan-hydrochlorothiazide (HYZAAR) 50-12.5 MG tablet TAKE 1 TABLET BY MOUTH EVERY DAY FOR BLOOD PRESSURE  . Magnesium Oxide 250 MG TABS Take 500 mg by mouth daily.   . meclizine (ANTIVERT) 25 MG tablet Take 1 tablet (25  mg total) by mouth 3 (three) times daily. For dizziness  . nitroGLYCERIN (NITROSTAT) 0.4 MG SL tablet Place 0.4 mg under the tongue every 5 (five) minutes as needed for chest pain.  Marland Kitchen omeprazole (PRILOSEC) 20 MG capsule TAKE 1 CAPSULE BY MOUTH EVERY DAY  . predniSONE (DELTASONE) 5 MG tablet TAKE 1 TABLET BY MOUTH EVERY DAY IN THE MORNING TO REDUCE PAIN  . Semaglutide, 1 MG/DOSE, (OZEMPIC, 1 MG/DOSE,) 2 MG/1.5ML SOPN Inject 1 mg into the skin once a week.  . vitamin C (ASCORBIC ACID) 500 MG tablet Take 500 mg by mouth daily.     Allergies:   Ciprofloxacin, Advair diskus [fluticasone-salmeterol], Albuterol, Alendronate sodium, Benadryl [diphenhydramine hcl], Benzonatate, Cephalexin, Gabapentin, Iohexol, Keflex [cephalexin], Morphine, Other, Propoxyphene hcl, Reclast [zoledronic acid], Avapro [irbesartan], Codeine, Tessalon perles, and Tramadol   Social History   Socioeconomic History  .  Marital status: Widowed    Spouse name: Not on file  . Number of children: Not on file  . Years of education: Not on file  . Highest education level: Not on file  Occupational History  . Not on file  Tobacco Use  . Smoking status: Never Smoker  . Smokeless tobacco: Never Used  Vaping Use  . Vaping Use: Never used  Substance and Sexual Activity  . Alcohol use: No  . Drug use: No  . Sexual activity: Not Currently  Other Topics Concern  . Not on file  Social History Narrative  . Not on file   Social Determinants of Health   Financial Resource Strain:   . Difficulty of Paying Living Expenses: Not on file  Food Insecurity:   . Worried About Charity fundraiser in the Last Year: Not on file  . Ran Out of Food in the Last Year: Not on file  Transportation Needs:   . Lack of Transportation (Medical): Not on file  . Lack of Transportation (Non-Medical): Not on file  Physical Activity:   . Days of Exercise per Week: Not on file  . Minutes of Exercise per Session: Not on file  Stress:   . Feeling of  Stress : Not on file  Social Connections:   . Frequency of Communication with Friends and Family: Not on file  . Frequency of Social Gatherings with Friends and Family: Not on file  . Attends Religious Services: Not on file  . Active Member of Clubs or Organizations: Not on file  . Attends Archivist Meetings: Not on file  . Marital Status: Not on file     Family History: The patient's family history includes Cancer in her brother; Diabetes in her brother, father, and son; Heart disease in her father; Stroke in her father and mother.  ROS:   Please see the history of present illness.    All other systems reviewed and are negative.  EKGs/Labs/Other Studies Reviewed:    The following studies were reviewed today: Cardiac Cath [id number]: Conclusion  Widely patent coronary arteries with mild nonobstructive CAD, no change from previous study  Recommend: medical therapy.   **if repeat cath needed would NOT use right radial access**  Diagnostic Dominance: Right   Echo 10/19/2016: Study Conclusions   - Left ventricle: The cavity size was normal. There was mild  concentric hypertrophy. Systolic function was vigorous. The  estimated ejection fraction was in the range of 65% to 70%. Wall  motion was normal; there were no regional wall motion  abnormalities. Doppler parameters are consistent with abnormal  left ventricular relaxation (grade 1 diastolic dysfunction).  - Aortic valve: Trileaflet; normal thickness leaflets. There was no  regurgitation.  - Aortic root: The aortic root was normal in size.  - Mitral valve: Mildly thickened leaflets . There was no  regurgitation.  - Left atrium: The atrium was normal in size.  - Right ventricle: The cavity size was normal. Wall thickness was  normal. Systolic function was normal.  - Right atrium: The atrium was normal in size.  - Tricuspid valve: There was mild regurgitation.  - Pulmonic valve: There was no  regurgitation.  - Pulmonary arteries: Systolic pressure was within the normal  range.  - Inferior vena cava: The vessel was normal in size.  - Pericardium, extracardiac: A mild pericardial effusion was  identified posterior to the heart. Features were not consistent  with tamponade physiology.   Impressions:   - No  significant change since the prior study on 09/03/15.   Left leg venous US: Summary:   LEFT:  - There is no evidence of deep vein thrombosis in the lower extremity.  - There is no evidence of superficial venous thrombosis.   EKG:  EKG is ordered today.  The ekg ordered today demonstrates normal sinus rhythm 79 bpm, wandering baseline, nonspecific ST abnormality.  Recent Labs: 11/01/2019: ALT 22 02/06/2020: BUN 19; Creat 1.01; Hemoglobin 13.8; Platelets 368; Potassium 4.2; Sodium 140  Recent Lipid Panel    Component Value Date/Time   CHOL 149 11/01/2019 0858   CHOL 215 (H) 08/07/2015 0854   TRIG 215 (H) 11/01/2019 0858   HDL 49 (L) 11/01/2019 0858   HDL 45 08/07/2015 0854   CHOLHDL 3.0 11/01/2019 0858   VLDL 53 (H) 11/04/2016 0958   LDLCALC 69 11/01/2019 0858    Physical Exam:    VS:  BP 116/66   Pulse 79   Ht 5\' 2"  (1.575 m)   Wt 200 lb (90.7 kg)   BMI 36.58 kg/m     Wt Readings from Last 3 Encounters:  02/13/20 200 lb (90.7 kg)  02/08/20 199 lb 1.6 oz (90.3 kg)  11/06/19 207 lb (93.9 kg)     GEN:  Well nourished, well developed in no acute distress HEENT: Normal NECK: No JVD; No carotid bruits LYMPHATICS: No lymphadenopathy CARDIAC: RRR, no murmurs, rubs, gallops RESPIRATORY:  Clear to auscultation without rales, wheezing or rhonchi  ABDOMEN: Soft, non-tender, non-distended MUSCULOSKELETAL: Trace edema left pretibial region and calf; No deformity  SKIN: Warm and dry NEUROLOGIC:  Alert and oriented x 3 PSYCHIATRIC:  Normal affect   ASSESSMENT:    1. Takotsubo syndrome   2. Essential hypertension, benign   3. Mixed hyperlipidemia   4.  Coronary artery disease involving native coronary artery of native heart without angina pectoris   5. Shortness of breath   6. Left leg swelling    PLAN:    In order of problems listed above:  1. Appears stable, some atypical chest pains noted, check 2D echocardiogram. 2. Blood pressure well controlled on amlodipine 2.5 mg daily, isosorbide, losartan, and hydrochlorothiazide. 3. Treated with atorvastatin 20 mg daily.  Recent labs with LDL cholesterol 69 mg/dL. 4. Mild nonobstructive CAD at cardiac catheterization.  Now with some atypical chest discomfort.  No further testing indicated at this time.  Continue medical management as outlined above. 5. Check 2D echocardiogram, and risk for diastolic dysfunction. 6. Not impressive on today's exam, but she states it has been much worse.  Could be venous insufficiency.  Also possible that amlodipine is contributing but she has been on this for a long time.  No DVT on duplex ultrasound.  We talked about compression but this is really not practical for her.  We also discussed leg elevation techniques.  We will check an echocardiogram to make sure no major cardiac component.   Medication Adjustments/Labs and Tests Ordered: Current medicines are reviewed at length with the patient today.  Concerns regarding medicines are outlined above.  Orders Placed This Encounter  Procedures  . EKG 12-Lead  . ECHOCARDIOGRAM COMPLETE   No orders of the defined types were placed in this encounter.   Patient Instructions  Medication Instructions:  Your provider recommends that you continue on your current medications as directed. Please refer to the Current Medication list given to you today.   *If you need a refill on your cardiac medications before your next appointment, please call  your pharmacy*  Testing/Procedures: Your physician has requested that you have an echocardiogram. Echocardiography is a painless test that uses sound waves to create images of your  heart. It provides your doctor with information about the size and shape of your heart and how well your heart's chambers and valves are working. This procedure takes approximately one hour. There are no restrictions for this procedure.    Follow-Up: At [facility], you and your health needs are our priority.  As part of our continuing mission to provide you with exceptional heart care, we have created designated Provider Care Teams.  These Care Teams include your primary Cardiologist (physician) and Advanced Practice Providers (APPs -  Physician Assistants and Nurse Practitioners) who all work together to provide you with the care you need, when you need it. Your next appointment:   12 month(s) The format for your next appointment:   In Person Provider:   You may see Sherren Mocha, MD or one of the following Advanced Practice Providers on your designated Care Team:    Richardson Dopp, PA-C  Robbie Lis, [place]      Signed, Sherren Mocha, MD  02/13/2020 4:42 PM    [place]

## 2020-02-13 NOTE — Patient Instructions (Signed)
Medication Instructions:  Your provider recommends that you continue on your current medications as directed. Please refer to the Current Medication list given to you today.   *If you need a refill on your cardiac medications before your next appointment, please call your pharmacy*  Testing/Procedures: Your physician has requested that you have an echocardiogram. Echocardiography is a painless test that uses sound waves to create images of your heart. It provides your doctor with information about the size and shape of your heart and how well your heart's chambers and valves are working. This procedure takes approximately one hour. There are no restrictions for this procedure.    Follow-Up: At [facility], you and your health needs are our priority.  As part of our continuing mission to provide you with exceptional heart care, we have created designated Provider Care Teams.  These Care Teams include your primary Cardiologist (physician) and Advanced Practice Providers (APPs -  Physician Assistants and Nurse Practitioners) who all work together to provide you with the care you need, when you need it. Your next appointment:   12 month(s) The format for your next appointment:   In Person Provider:   You may see Sherren Mocha, MD or one of the following Advanced Practice Providers on your designated Care Team:    Richardson Dopp, PA-C  [place], [place]

## 2020-02-29 ENCOUNTER — Ambulatory Visit (HOSPITAL_COMMUNITY): Payer: Medicare Other | Attending: Cardiology

## 2020-02-29 ENCOUNTER — Other Ambulatory Visit: Payer: Self-pay

## 2020-02-29 DIAGNOSIS — R0602 Shortness of breath: Secondary | ICD-10-CM | POA: Diagnosis not present

## 2020-02-29 LAB — ECHOCARDIOGRAM COMPLETE
Area-P 1/2: 3.31 cm2
S' Lateral: 2.4 cm

## 2020-03-01 ENCOUNTER — Other Ambulatory Visit: Payer: Self-pay | Admitting: Internal Medicine

## 2020-03-01 ENCOUNTER — Telehealth: Payer: Self-pay | Admitting: Cardiovascular Disease

## 2020-03-01 DIAGNOSIS — K219 Gastro-esophageal reflux disease without esophagitis: Secondary | ICD-10-CM

## 2020-03-01 NOTE — Telephone Encounter (Signed)
The patient clarified she was told she was going to be called with results yesterday and was not. Reiterated to the patient results are called only after the doctor has reviewed them. Reviewed echo results received today with patient who verbalized understanding.   She was grateful for assistance.

## 2020-03-01 NOTE — Telephone Encounter (Signed)
-----   Message from Sherren Mocha, MD sent at 03/01/2020 11:58 AM EDT ----- Echo study looks good with normal LV function, normal RV function, and no significant valvular disease

## 2020-03-01 NOTE — Telephone Encounter (Signed)
New message:     Patient calling to check the status from when she call on yesterday. Please call patient.

## 2020-03-08 ENCOUNTER — Other Ambulatory Visit: Payer: Self-pay | Admitting: Internal Medicine

## 2020-03-08 DIAGNOSIS — M545 Low back pain, unspecified: Secondary | ICD-10-CM

## 2020-03-12 ENCOUNTER — Telehealth: Payer: Self-pay | Admitting: *Deleted

## 2020-03-12 MED ORDER — DONEPEZIL HCL 5 MG PO TABS
5.0000 mg | ORAL_TABLET | Freq: Every day | ORAL | 3 refills | Status: DC
Start: 2020-03-12 — End: 2020-12-10

## 2020-03-12 NOTE — Telephone Encounter (Signed)
Added MMSE to next Appointment.

## 2020-03-12 NOTE — Telephone Encounter (Signed)
We will need to do her memory testing next time.  Has no recent useful screening. MMSE - Mini Mental State Exam 11/09/2017 11/04/2016  Orientation to time 5 5  Orientation to Place 5 5  Registration 3 3  Attention/ Calculation 5 5  Recall 3 2  Language- name 2 objects 2 2  Language- repeat 1 1  Language- follow 3 step command 3 3  Language- read & follow direction 1 1  Write a sentence 1 1  Copy design 1 1  Total score 30 29   Can you add that to the appt notes for next time?  Thanks. I sent the ar[facility]pt to [facility].

## 2020-03-12 NOTE — Telephone Encounter (Signed)
Patient called and stated that at her last OV on 02/08/2020 Dr. Mariea Clonts suggested patient to start memory medication but nothing was sent to Pharmacy. Patient is requesting medication to be sent to [facility].

## 2020-05-02 DIAGNOSIS — L814 Other melanin hyperpigmentation: Secondary | ICD-10-CM | POA: Diagnosis not present

## 2020-05-02 DIAGNOSIS — D2272 Melanocytic nevi of left lower limb, including hip: Secondary | ICD-10-CM | POA: Diagnosis not present

## 2020-05-02 DIAGNOSIS — L820 Inflamed seborrheic keratosis: Secondary | ICD-10-CM | POA: Diagnosis not present

## 2020-05-02 DIAGNOSIS — D1801 Hemangioma of skin and subcutaneous tissue: Secondary | ICD-10-CM | POA: Diagnosis not present

## 2020-05-02 DIAGNOSIS — L720 Epidermal cyst: Secondary | ICD-10-CM | POA: Diagnosis not present

## 2020-05-02 DIAGNOSIS — L821 Other seborrheic keratosis: Secondary | ICD-10-CM | POA: Diagnosis not present

## 2020-06-03 ENCOUNTER — Other Ambulatory Visit: Payer: Self-pay | Admitting: Internal Medicine

## 2020-06-03 NOTE — Telephone Encounter (Signed)
rx sent to pharmacy by e-script  

## 2020-06-06 ENCOUNTER — Other Ambulatory Visit: Payer: Self-pay | Admitting: Internal Medicine

## 2020-06-06 ENCOUNTER — Other Ambulatory Visit: Payer: Self-pay

## 2020-06-06 ENCOUNTER — Encounter (INDEPENDENT_AMBULATORY_CARE_PROVIDER_SITE_OTHER): Payer: Self-pay | Admitting: Ophthalmology

## 2020-06-06 ENCOUNTER — Ambulatory Visit (INDEPENDENT_AMBULATORY_CARE_PROVIDER_SITE_OTHER): Payer: Medicare Other | Admitting: Ophthalmology

## 2020-06-06 ENCOUNTER — Other Ambulatory Visit: Payer: Medicare Other

## 2020-06-06 DIAGNOSIS — E1121 Type 2 diabetes mellitus with diabetic nephropathy: Secondary | ICD-10-CM

## 2020-06-06 DIAGNOSIS — E113491 Type 2 diabetes mellitus with severe nonproliferative diabetic retinopathy without macular edema, right eye: Secondary | ICD-10-CM | POA: Diagnosis not present

## 2020-06-06 DIAGNOSIS — E113392 Type 2 diabetes mellitus with moderate nonproliferative diabetic retinopathy without macular edema, left eye: Secondary | ICD-10-CM

## 2020-06-06 DIAGNOSIS — I10 Essential (primary) hypertension: Secondary | ICD-10-CM | POA: Diagnosis not present

## 2020-06-06 DIAGNOSIS — I25119 Atherosclerotic heart disease of native coronary artery with unspecified angina pectoris: Secondary | ICD-10-CM

## 2020-06-06 DIAGNOSIS — H35372 Puckering of macula, left eye: Secondary | ICD-10-CM

## 2020-06-06 NOTE — Assessment & Plan Note (Signed)
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

## 2020-06-06 NOTE — Progress Notes (Signed)
06/06/2020     CHIEF COMPLAINT Patient presents for Retina Follow Up (6 Month F/U OU//Pt sts, "I don't see as well as I did" at distance and near OU, OD>OS. No other new symptoms reported OU./)   HISTORY OF PRESENT ILLNESS: [name] is a 80 y.o. female who presents to the clinic today for:   HPI    Retina Follow Up    Patient presents with  Other.  In both eyes.  This started 6 months ago.  Severity is mild.  Duration of 6 months.  Since onset it is stable. Additional comments: 6 Month F/U OU  Pt sts, "I don't see as well as I did" at distance and near OU, OD>OS. No other new symptoms reported OU.        Last edited by Ileana Roup, COA on 06/06/2020 10:47 AM. (History)      Referring physician: Kermit Balo, DO [address] [place],  [place] [address]  HISTORICAL INFORMATION:   Selected notes from the MEDICAL RECORD NUMBER    Lab Results  Component Value Date   HGBA1C 6.9 (H) 02/06/2020     CURRENT MEDICATIONS: No current outpatient medications on file. (Ophthalmic Drugs)   No current facility-administered medications for this visit. (Ophthalmic Drugs)   Current Outpatient Medications (Other)  Medication Sig  . amLODipine (NORVASC) 2.5 MG tablet Take 1 tablet (2.5 mg total) by mouth daily.  Marland Kitchen aspirin 81 MG tablet Take 81 mg by mouth daily.   Marland Kitchen atorvastatin (LIPITOR) 20 MG tablet Take 1 tablet (20 mg total) by mouth daily.  . Calcium Citrate-Vitamin D (CALCIUM CITRATE + D PO) Take by mouth daily.  . cetirizine (ZYRTEC) 10 MG tablet Take 10 mg by mouth daily as needed for allergies. For allergies  . Cholecalciferol (VITAMIN D3) 5000 UNITS CAPS Take 1 tablet by mouth daily. For supplement  . CINNAMON PO Take 1,000 mg by mouth daily.  . citalopram (CELEXA) 40 MG tablet TAKE 1 TABLET BY MOUTH EVERY DAY FOR DEPRESSION  . Continuous Blood Gluc Receiver (FREESTYLE LIBRE 14 DAY READER) DEVI CHECK BLOOD SUGAR 4 TIMES DAILY (FASTING AND WITH EACH MEAL) Dx: E11.9  .  Continuous Blood Gluc Sensor (FREESTYLE LIBRE 14 DAY SENSOR) MISC Inject 1 Units into the skin 2 (two) times daily.  Marland Kitchen docusate sodium (COLACE) 100 MG capsule Take 100 mg by mouth 2 (two) times daily.  Marland Kitchen donepezil (ARICEPT) 5 MG tablet Take 1 tablet (5 mg total) by mouth at bedtime.  . ferrous sulfate 325 (65 FE) MG tablet Take 325 mg by mouth 2 (two) times daily with a meal.  . furosemide (LASIX) 20 MG tablet Take 20 mg by mouth.  Marland Kitchen glipiZIDE (GLUCOTROL XL) 10 MG 24 hr tablet TAKE 1 TABLET BY MOUTH EVERY DAY IN THE MORNING AND 1 TABLET IN EVENING FOR SUGAR CONTROL  . insulin NPH Human (HUMULIN N) 100 UNIT/ML injection INJECT 40 UNITS IN THE MORNING AND 40 UNITS IN THE EVENING  . Insulin Syringes, Disposable, U-100 1 ML MISC Use to inject insulin Dx: X91.4782  . isosorbide mononitrate (IMDUR) 30 MG 24 hr tablet Take 1 tablet (30 mg total) by mouth daily.  Marland Kitchen losartan-hydrochlorothiazide (HYZAAR) 50-12.5 MG tablet TAKE 1 TABLET BY MOUTH EVERY DAY FOR BLOOD PRESSURE  . Magnesium Oxide 250 MG TABS Take 500 mg by mouth daily.   . meclizine (ANTIVERT) 25 MG tablet Take 1 tablet (25 mg total) by mouth 3 (three) times daily. For  dizziness  . nitroGLYCERIN (NITROSTAT) 0.4 MG SL tablet Place 0.4 mg under the tongue every 5 (five) minutes as needed for chest pain.  Marland Kitchen omeprazole (PRILOSEC) 20 MG capsule TAKE 1 CAPSULE BY MOUTH EVERY DAY  . predniSONE (DELTASONE) 5 MG tablet TAKE 1 TABLET BY MOUTH EVERY DAY IN THE MORNING TO REDUCE PAIN  . Semaglutide, 1 MG/DOSE, (OZEMPIC, 1 MG/DOSE,) 2 MG/1.5ML SOPN Inject 1 mg into the skin once a week.  . vitamin C (ASCORBIC ACID) 500 MG tablet Take 500 mg by mouth daily.   No current facility-administered medications for this visit. (Other)      REVIEW OF SYSTEMS:    ALLERGIES Allergies  Allergen Reactions  . Ciprofloxacin Rash  . Advair Diskus [Fluticasone-Salmeterol] Other (See Comments)    Throat closes  . Albuterol     REACTION: closes throat  .  Alendronate Sodium Itching  . Benadryl [Diphenhydramine Hcl] Itching  . Benzonatate     REACTION: rash/hives  . Cephalexin Nausea Only  . Gabapentin Other (See Comments)    Loss of memory  . Iohexol      Desc: rash and DIF BREATHING   . Keflex [Cephalexin] Nausea And Vomiting  . Morphine Nausea And Vomiting  . Other Other (See Comments)    Decongestants- keeps pt awake at night, increased heart rate  . Propoxyphene Hcl     Doesn't recall  . Reclast [Zoledronic Acid] Other (See Comments)    Chest pain  . Avapro [Irbesartan] Rash  . Codeine Nausea And Vomiting, Swelling and Rash  . Tessalon Perles Rash  . Tramadol Nausea Only and Rash    PAST MEDICAL HISTORY Past Medical History:  Diagnosis Date  . Abdominal pain, unspecified site   . Abnormality of gait   . Apnea   . Cardiomegaly   . Chest pain, unspecified   . Complication of anesthesia    hard time waking up  . Depressive disorder, not elsewhere classified   . Diabetes mellitus   . Diabetic retinopathy ([place])   . Dizziness and giddiness   . Dyskinesia of esophagus   . Edema   . Extrinsic asthma, unspecified   . Female stress incontinence   . Gout, unspecified   . High cholesterol   . Hypertension   . Lipoma of other skin and subcutaneous tissue   . Lumbago   . Memory loss   . Migraine without aura, with intractable migraine, so stated, with status migrainosus   . Mild cognitive impairment, so stated   . Nonspecific (abnormal) findings on radiological and other examination of abdominal area, including retroperitoneum   . Nonspecific abnormal results of liver function study   . Obesity, unspecified   . Obstructive chronic bronchitis with exacerbation ([place])   . Other and unspecified hyperlipidemia   . Other B-complex deficiencies   . Other nonspecific abnormal serum enzyme levels   . Other specified cardiac dysrhythmias(427.89)   . Other specified disease of sebaceous glands   . Other symptoms involving  cardiovascular system   . Pain in joint, ankle and foot   . Pain in joint, pelvic region and thigh   . Pain in joint, shoulder region   . Palpitations   . Reflux esophagitis   . Shortness of breath   . Tension headache   . Type I (juvenile type) diabetes mellitus without mention of complication, not stated as uncontrolled   . Type I (juvenile type) diabetes mellitus without mention of complication, uncontrolled   . Unspecified essential  hypertension   . Unspecified hypothyroidism   . Unspecified vitamin D deficiency   . Ventral hernia, unspecified, without mention of obstruction or gangrene    Past Surgical History:  Procedure Laterality Date  . ABDOMINAL HYSTERECTOMY    . APPENDECTOMY    . BREAST SURGERY    . EXPLORATORY LAPAROTOMY W/ BOWEL RESECTION  07/25/2004   PROCEDURE: Laparoscopy, open laparotomy, resection of jejunojejunostomy  . INCISIONAL HERNIA REPAIR  03/19/2006   PROCEDURE: Open ventral hernia repair with mesh.  Marland Kitchen LAPAROSCOPIC GASTRIC BYPASS  07/22/2004   PROCEDURE: Laparoscopic Roux-en-Y gastric bypass, antecolic, antegastric,  . LAPAROSCOPIC INCISIONAL / UMBILICAL / VENTRAL HERNIA REPAIR  09/22/2005   PROCEDURE: Laparoscopic ventral hernia repair with mesh.  Marland Kitchen LEFT HEART CATH AND CORONARY ANGIOGRAPHY N/A 11/20/2016   Procedure: Left Heart Cath and Coronary Angiography;  Surgeon: Sherren Mocha, MD;  Location: [facility];  Service: Cardiovascular;  Laterality: N/A;  . LEFT HEART CATHETERIZATION WITH CORONARY ANGIOGRAM N/A 08/21/2014   Procedure: LEFT HEART CATHETERIZATION WITH CORONARY ANGIOGRAM;  Surgeon: Sherren Mocha, MD;  Location: [facility];  Service: Cardiovascular;  Laterality: N/A;  . stents     last time she said was 1 yr ago     FAMILY HISTORY Family History  Problem Relation Age of Onset  . Stroke Mother   . Stroke Father   . Diabetes Father   . Heart disease Father   . Diabetes Son   . Cancer Brother        BLADDER  . Diabetes  Brother     SOCIAL HISTORY Social History   Tobacco Use  . Smoking status: Never Smoker  . Smokeless tobacco: Never Used  Vaping Use  . Vaping Use: Never used  Substance Use Topics  . Alcohol use: No  . Drug use: No         OPHTHALMIC EXAM:  Base Eye Exam    Visual Acuity (ETDRS)      Right Left   Dist cc 20/25 -2 20/20   Correction: Glasses       Tonometry (Tonopen, 10:45 AM)      Right Left   Pressure 17 13       Pupils      Pupils Dark Light Shape React APD   Right PERRL 2 2 Round Minimal None   Left PERRL 2 2 Round Minimal None       Visual Fields (Counting fingers)      Left Right    Full Full       Extraocular Movement      Right Left    Full Full       Neuro/Psych    Oriented x3: Yes   Mood/Affect: Normal       Dilation    Both eyes: 1.0% Mydriacyl, 2.5% Phenylephrine @ 10:50 AM        Slit Lamp and Fundus Exam    External Exam      Right Left   External Normal Normal       Slit Lamp Exam      Right Left   Lids/Lashes Normal Normal   Conjunctiva/Sclera White and quiet White and quiet   Cornea Clear Clear   Anterior Chamber Deep and quiet Deep and quiet   Iris Round and reactive Round and reactive   Lens Posterior chamber intraocular lens, Centered posterior chamber intraocular lens Posterior chamber intraocular lens, Centered posterior chamber intraocular lens   Anterior Vitreous Normal Normal  Fundus Exam      Right Left   Posterior Vitreous Normal Normal   Disc Normal Normal   C/D Ratio 0.15 0.2   Macula Normal, microaneurysms temporal, no CSME Epiretinal membrane, nasal FAZ   Vessels NPDR-Severe NPDR- Moderate   Periphery Normal Normal          IMAGING AND PROCEDURES  Imaging and Procedures for 06/06/20  OCT, Retina - OU - Both Eyes       Right Eye Quality was good. Scan locations included subfoveal. Central Foveal Thickness: 221. Progression has been stable. Findings include abnormal foveal contour.    Left Eye Quality was good. Scan locations included subfoveal. Central Foveal Thickness: 240. Progression has been stable. Findings include abnormal foveal contour, epiretinal membrane.   Notes Micro-CME temporal, not center involved will observe  Epiretinal membrane, nasal to FAZ, not center involved will observe                ASSESSMENT/PLAN:  Non-proliferative diabetic retinopathy, severe, right eye (HCC) Stable, with micro-CME temporal to fovea with good acuity, not center involved thus we can observe  Macular pucker, left eye Minor, not foveal, nasal to FAZ      ICD-10-CM   1. Macular pucker, left eye  H35.372 OCT, Retina - OU - Both Eyes  2. Severe nonproliferative diabetic retinopathy of right eye without macular edema associated with type 2 diabetes mellitus (HCC)  E11.3491   3. Moderate nonproliferative diabetic retinopathy of left eye without macular edema associated with type 2 diabetes mellitus (HCC)  W11.9147E11.3392     1.  N o  Active ocular disease, will continue to monitor.  2. Patient reports no COVID vaccine, discussed.  Patient seems open to consideration of vaccination at this time, and is now inquiring about where to acquire Pfizer  3.  Ophthalmic Meds Ordered this visit:  No orders of the defined types were placed in this encounter.      Return in about 6 months (around 12/04/2020) for DILATE OU, OCT.  There are no Patient Instructions on file for this visit.   Explained the diagnoses, plan, and follow up with the patient and they expressed understanding.  Patient expressed understanding of the importance of proper follow up care.   Alford HighlandGary A. Birda Didonato M.D. Diseases & Surgery of the Retina and Vitreous [facility] 06/06/20     Abbreviations: M myopia (nearsighted); A astigmatism; H hyperopia (farsighted); P presbyopia; Mrx spectacle prescription;  CTL contact lenses; OD right eye; OS left eye; OU both eyes  XT exotropia; ET  esotropia; PEK punctate epithelial keratitis; PEE punctate epithelial erosions; DES dry eye syndrome; MGD meibomian gland dysfunction; ATs artificial tears; PFAT's preservative free artificial tears; NSC nuclear sclerotic cataract; PSC posterior subcapsular cataract; ERM epi-retinal membrane; PVD posterior vitreous detachment; RD retinal detachment; DM diabetes mellitus; DR diabetic retinopathy; NPDR non-proliferative diabetic retinopathy; PDR proliferative diabetic retinopathy; CSME clinically significant macular edema; DME diabetic macular edema; dbh dot blot hemorrhages; CWS cotton wool spot; POAG primary open angle glaucoma; C/D cup-to-disc ratio; HVF humphrey visual field; GVF goldmann visual field; OCT optical coherence tomography; IOP intraocular pressure; BRVO Branch retinal vein occlusion; CRVO central retinal vein occlusion; CRAO central retinal artery occlusion; BRAO branch retinal artery occlusion; RT retinal tear; SB scleral buckle; PPV pars plana vitrectomy; VH Vitreous hemorrhage; PRP panretinal laser photocoagulation; IVK intravitreal kenalog; VMT vitreomacular traction; MH Macular hole;  NVD neovascularization of the disc; NVE neovascularization elsewhere; AREDS age related eye disease study;  ARMD age related macular degeneration; POAG primary open angle glaucoma; EBMD epithelial/anterior basement membrane dystrophy; ACIOL anterior chamber intraocular lens; IOL intraocular lens; PCIOL posterior chamber intraocular lens; Phaco/IOL phacoemulsification with intraocular lens placement; [place] photorefractive keratectomy; LASIK laser assisted in situ keratomileusis; HTN hypertension; DM diabetes mellitus; COPD chronic obstructive pulmonary disease

## 2020-06-06 NOTE — Assessment & Plan Note (Signed)
Minor, not foveal, nasal to FAZ

## 2020-06-07 ENCOUNTER — Other Ambulatory Visit: Payer: Medicare Other

## 2020-06-07 LAB — CBC WITH DIFFERENTIAL/PLATELET
Absolute Monocytes: 496 cells/uL (ref 200–950)
Basophils Absolute: 88 cells/uL (ref 0–200)
Basophils Relative: 1.1 %
Eosinophils Absolute: 184 cells/uL (ref 15–500)
Eosinophils Relative: 2.3 %
HCT: 40.6 % (ref 35.0–45.0)
Hemoglobin: 13.3 g/dL (ref 11.7–15.5)
Lymphs Abs: 1840 cells/uL (ref 850–3900)
MCH: 29.2 pg (ref 27.0–33.0)
MCHC: 32.8 g/dL (ref 32.0–36.0)
MCV: 89.2 fL (ref 80.0–100.0)
MPV: 13.2 fL — ABNORMAL HIGH (ref 7.5–12.5)
Monocytes Relative: 6.2 %
Neutro Abs: 5392 cells/uL (ref 1500–7800)
Neutrophils Relative %: 67.4 %
Platelets: 349 10*3/uL (ref 140–400)
RBC: 4.55 10*6/uL (ref 3.80–5.10)
RDW: 12.5 % (ref 11.0–15.0)
Total Lymphocyte: 23 %
WBC: 8 10*3/uL (ref 3.8–10.8)

## 2020-06-07 LAB — COMPLETE METABOLIC PANEL WITH GFR
AG Ratio: 1.4 (calc) (ref 1.0–2.5)
ALT: 14 U/L (ref 6–29)
AST: 13 U/L (ref 10–35)
Albumin: 3.6 g/dL (ref 3.6–5.1)
Alkaline phosphatase (APISO): 83 U/L (ref 37–153)
BUN: 18 mg/dL (ref 7–25)
CO2: 30 mmol/L (ref 20–32)
Calcium: 9.4 mg/dL (ref 8.6–10.4)
Chloride: 102 mmol/L (ref 98–110)
Creat: 0.86 mg/dL (ref 0.60–0.88)
GFR, Est African American: 73 mL/min/{1.73_m2} (ref >=60)
GFR, Est Non African American: 63 mL/min/{1.73_m2} (ref >=60)
Globulin: 2.6 g/dL (calc) (ref 1.9–3.7)
Glucose, Bld: 81 mg/dL (ref 65–99)
Potassium: 3.8 mmol/L (ref 3.5–5.3)
Sodium: 142 mmol/L (ref 135–146)
Total Bilirubin: 0.5 mg/dL (ref 0.2–1.2)
Total Protein: 6.2 g/dL (ref 6.1–8.1)

## 2020-06-07 LAB — LIPID PANEL
Cholesterol: 134 mg/dL (ref <200)
HDL: 51 mg/dL (ref >=50)
LDL Cholesterol (Calc): 63 mg/dL (calc)
Non-HDL Cholesterol (Calc): 83 mg/dL (calc) (ref <130)
Total CHOL/HDL Ratio: 2.6 (calc) (ref <5.0)
Triglycerides: 117 mg/dL (ref <150)

## 2020-06-07 LAB — HEMOGLOBIN A1C
Hgb A1c MFr Bld: 6.7 % of total Hgb — ABNORMAL HIGH (ref <5.7)
Mean Plasma Glucose: 146 mg/dL
eAG (mmol/L): 8.1 mmol/L

## 2020-06-07 NOTE — Progress Notes (Signed)
All labs have improved.  We'll review at her appt.

## 2020-06-10 ENCOUNTER — Other Ambulatory Visit: Payer: Self-pay

## 2020-06-10 ENCOUNTER — Ambulatory Visit (INDEPENDENT_AMBULATORY_CARE_PROVIDER_SITE_OTHER): Payer: Medicare Other | Admitting: Internal Medicine

## 2020-06-10 ENCOUNTER — Encounter: Payer: Self-pay | Admitting: Internal Medicine

## 2020-06-10 VITALS — BP 122/68 | HR 88 | Temp 97.5°F | Ht 62.0 in | Wt 197.5 lb

## 2020-06-10 DIAGNOSIS — E1121 Type 2 diabetes mellitus with diabetic nephropathy: Secondary | ICD-10-CM

## 2020-06-10 DIAGNOSIS — I25119 Atherosclerotic heart disease of native coronary artery with unspecified angina pectoris: Secondary | ICD-10-CM

## 2020-06-10 DIAGNOSIS — Z609 Problem related to social environment, unspecified: Secondary | ICD-10-CM | POA: Diagnosis not present

## 2020-06-10 DIAGNOSIS — R2681 Unsteadiness on feet: Secondary | ICD-10-CM | POA: Diagnosis not present

## 2020-06-10 DIAGNOSIS — I10 Essential (primary) hypertension: Secondary | ICD-10-CM

## 2020-06-10 DIAGNOSIS — G3184 Mild cognitive impairment, so stated: Secondary | ICD-10-CM

## 2020-06-10 DIAGNOSIS — Z7189 Other specified counseling: Secondary | ICD-10-CM

## 2020-06-10 DIAGNOSIS — I872 Venous insufficiency (chronic) (peripheral): Secondary | ICD-10-CM

## 2020-06-10 MED ORDER — MECLIZINE HCL 25 MG PO TABS: 25.0000 mg | ORAL_TABLET | Freq: Three times a day (TID) | ORAL | 0 refills | Status: DC | PRN

## 2020-06-10 NOTE — Progress Notes (Signed)
Location:  [facility] clinic Provider:  Jones Viviani L. Mariea Clonts, D.O., C.M.D.  Code Status: DNR Goals of Care:  Advanced Directives 06/10/2020  Does Patient Have a Medical Advance Directive? Yes  Type of Advance Directive Out of facility DNR (pink MOST or yellow form)  Does patient want to make changes to medical advance directive? No - Patient declined  Copy of [place] in Chart? -  Would patient like information on creating a medical advance directive? -  Pre-existing out of facility DNR order (yellow form or pink MOST form) Pink MOST/Yellow Form most recent copy in chart - Physician notified to receive inpatient order     Chief Complaint  Patient presents with  . Medical Management of Chronic Issues    4 month follow up / MMSE     HPI: Patient is a 80 y.o. female seen today for medical management of chronic diseases.    She just had a bday.  She admits she cannot do what she used to.  Both legs are swelling some.   Breathing is fine.  On lasix 20mg  daily.  She had some skin places/keratoses removed from her right thigh that were not malignant.  They were itching and she kept digging.    She has some problems with balance.  Uses a cane out and furniture surfs at home.  Sleeps on her sofa on her left side.  She does not go upstairs where her main bedroom is located.    Says sometimes she loses her train of thought in the middle of something.  She scored 30/30 on mmse.  She's been on aricept and read she cannot take it with omeprazole.   MMSE - Mini Mental State Exam 06/10/2020 11/09/2017 11/04/2016  Orientation to time 5 5 5   Orientation to Place 5 5 5   Registration 3 3 3   Attention/ Calculation 5 5 5   Recall 3 3 2   Language- name 2 objects 2 2 2   Language- repeat 1 1 1   Language- follow 3 step command 3 3 3   Language- read & follow direction 1 1 1   Write a sentence 1 1 1   Copy design 1 1 1   Total score 30 30 29    Uses humulin insulin not novolin.  She chose not to  take ozempic b/c of the potential side effects.  Still on glipizide with hypoglycemia risk.  Uses meclizine prn not tid.  Still won't take covid shot b/c thinks her friends died from the vaccine.  We talked about this in detail last time also and Dr. Zadie Rhine and I both spent several minutes each talking with her about this.  She continues to decline covid vaccines--she understands she could die from covid.  She is worried about still being 13th on the waiting list at the [facility] in [place].  She reports her finances are in order, but will eventually need to sell her home.  Has her cremation set up.  She is trying to take care of herself by staying home, watching tv, reading, works puzzles and does what she can to help herself.  Only cooking is in the microwave.  She picks up groceries thru drive-thru, similar with banking, etc.  Does takeout.  Lives alone.  Asks about caregiver services, but does not truly need nursing care at this time so would be out of pocket.  Offered [facility] social services to help assist her with planning for her move to a retirement community.  She declines.    [place], but has to burn her trash against the rules.  Now she has a barrel and her son has to burn the trash when he comes to visit.  No one has been in her house for over a year due to concerns that she'll get covid.  Says there's no one to care for her if she gets sick from the vaccine.    Past Medical History:  Diagnosis Date  . Abdominal pain, unspecified site   . Abnormality of gait   . Apnea   . Cardiomegaly   . Chest pain, unspecified   . Complication of anesthesia    hard time waking up  . Depressive disorder, not elsewhere classified   . Diabetes mellitus   . Diabetic retinopathy ([place])   . Dizziness and giddiness   . Dyskinesia of esophagus   . Edema   . Extrinsic asthma, unspecified   . Female stress incontinence   . Gout, unspecified   . High cholesterol    . Hypertension   . Lipoma of other skin and subcutaneous tissue   . Lumbago   . Memory loss   . Migraine without aura, with intractable migraine, so stated, with status migrainosus   . Mild cognitive impairment, so stated   . Nonspecific (abnormal) findings on radiological and other examination of abdominal area, including retroperitoneum   . Nonspecific abnormal results of liver function study   . Obesity, unspecified   . Obstructive chronic bronchitis with exacerbation ([place])   . Other and unspecified hyperlipidemia   . Other B-complex deficiencies   . Other nonspecific abnormal serum enzyme levels   . Other specified cardiac dysrhythmias(427.89)   . Other specified disease of sebaceous glands   . Other symptoms involving cardiovascular system   . Pain in joint, ankle and foot   . Pain in joint, pelvic region and thigh   . Pain in joint, shoulder region   . Palpitations   . Reflux esophagitis   . Shortness of breath   . Tension headache   . Type I (juvenile type) diabetes mellitus without mention of complication, not stated as uncontrolled   . Type I (juvenile type) diabetes mellitus without mention of complication, uncontrolled   . Unspecified essential hypertension   . Unspecified hypothyroidism   . Unspecified vitamin D deficiency   . Ventral hernia, unspecified, without mention of obstruction or gangrene     Past Surgical History:  Procedure Laterality Date  . ABDOMINAL HYSTERECTOMY    . APPENDECTOMY    . BREAST SURGERY    . EXPLORATORY LAPAROTOMY W/ BOWEL RESECTION  07/25/2004   PROCEDURE: Laparoscopy, open laparotomy, resection of jejunojejunostomy  . INCISIONAL HERNIA REPAIR  03/19/2006   PROCEDURE: Open ventral hernia repair with mesh.  Marland Kitchen LAPAROSCOPIC GASTRIC BYPASS  07/22/2004   PROCEDURE: Laparoscopic Roux-en-Y gastric bypass, antecolic, antegastric,  . LAPAROSCOPIC INCISIONAL / UMBILICAL / VENTRAL HERNIA REPAIR  09/22/2005   PROCEDURE: Laparoscopic ventral  hernia repair with mesh.  Marland Kitchen LEFT HEART CATH AND CORONARY ANGIOGRAPHY N/A 11/20/2016   Procedure: Left Heart Cath and Coronary Angiography;  Surgeon: Sherren Mocha, MD;  Location: [facility];  Service: Cardiovascular;  Laterality: N/A;  . LEFT HEART CATHETERIZATION WITH CORONARY ANGIOGRAM N/A 08/21/2014   Procedure: LEFT HEART CATHETERIZATION WITH CORONARY ANGIOGRAM;  Surgeon: Sherren Mocha, MD;  Location: [facility] CATH LAB;  Service: Cardiovascular;  Laterality: N/A;  . stents     last time she said was  1 yr ago     Allergies  Allergen Reactions  . Ciprofloxacin Rash  . Advair Diskus [Fluticasone-Salmeterol] Other (See Comments)    Throat closes  . Albuterol     REACTION: closes throat  . Alendronate Sodium Itching  . Benadryl [Diphenhydramine Hcl] Itching  . Benzonatate     REACTION: rash/hives  . Cephalexin Nausea Only  . Gabapentin Other (See Comments)    Loss of memory  . Iohexol      Desc: rash and DIF BREATHING   . Keflex [Cephalexin] Nausea And Vomiting  . Morphine Nausea And Vomiting  . Other Other (See Comments)    Decongestants- keeps pt awake at night, increased heart rate  . Propoxyphene Hcl     Doesn't recall  . Reclast [Zoledronic Acid] Other (See Comments)    Chest pain  . Avapro [Irbesartan] Rash  . Codeine Nausea And Vomiting, Swelling and Rash  . Tessalon Perles Rash  . Tramadol Nausea Only and Rash    Outpatient Encounter Medications as of 06/10/2020  Medication Sig  . amLODipine (NORVASC) 2.5 MG tablet Take 1 tablet (2.5 mg total) by mouth daily.  Marland Kitchen aspirin 81 MG tablet Take 81 mg by mouth daily.  Marland Kitchen atorvastatin (LIPITOR) 20 MG tablet Take 1 tablet (20 mg total) by mouth daily.  . Calcium Citrate-Vitamin D (CALCIUM CITRATE + D PO) Take by mouth daily.  . cetirizine (ZYRTEC) 10 MG tablet Take 10 mg by mouth daily as needed for allergies. For allergies  . Cholecalciferol (VITAMIN D3) 5000 UNITS CAPS Take 1 tablet by mouth daily. For supplement  .  CINNAMON PO Take 1,000 mg by mouth daily.  . citalopram (CELEXA) 40 MG tablet TAKE 1 TABLET BY MOUTH EVERY DAY FOR DEPRESSION  . Continuous Blood Gluc Receiver (FREESTYLE LIBRE 14 DAY READER) DEVI CHECK BLOOD SUGAR 4 TIMES DAILY (FASTING AND WITH EACH MEAL) Dx: E11.9  . Continuous Blood Gluc Sensor (FREESTYLE LIBRE 14 DAY SENSOR) MISC Inject 1 Units into the skin 2 (two) times daily.  Marland Kitchen docusate sodium (COLACE) 100 MG capsule Take 100 mg by mouth 2 (two) times daily.  Marland Kitchen donepezil (ARICEPT) 5 MG tablet Take 1 tablet (5 mg total) by mouth at bedtime.  . ferrous sulfate 325 (65 FE) MG tablet Take 325 mg by mouth 2 (two) times daily with a meal.  . furosemide (LASIX) 20 MG tablet Take 20 mg by mouth.  Marland Kitchen glipiZIDE (GLUCOTROL XL) 10 MG 24 hr tablet TAKE 1 TABLET BY MOUTH EVERY DAY IN THE MORNING AND 1 TABLET IN EVENING FOR SUGAR CONTROL  . insulin NPH Human (HUMULIN N) 100 UNIT/ML injection INJECT 40 UNITS IN THE MORNING AND 40 UNITS IN THE EVENING  . Insulin Syringes, Disposable, U-100 1 ML MISC Use to inject insulin Dx: [id number]  . isosorbide mononitrate (IMDUR) 30 MG 24 hr tablet Take 1 tablet (30 mg total) by mouth daily.  Marland Kitchen losartan-hydrochlorothiazide (HYZAAR) 50-12.5 MG tablet TAKE 1 TABLET BY MOUTH EVERY DAY FOR BLOOD PRESSURE  . Magnesium Oxide 250 MG TABS Take 500 mg by mouth daily.  . meclizine (ANTIVERT) 25 MG tablet Take 1 tablet (25 mg total) by mouth 3 (three) times daily. For dizziness  . nitroGLYCERIN (NITROSTAT) 0.4 MG SL tablet Place 0.4 mg under the tongue every 5 (five) minutes as needed for chest pain.  Marland Kitchen omeprazole (PRILOSEC) 20 MG capsule TAKE 1 CAPSULE BY MOUTH EVERY DAY  . predniSONE (DELTASONE) 5 MG tablet TAKE 1 TABLET BY MOUTH EVERY  DAY IN THE MORNING TO REDUCE PAIN  . Semaglutide, 1 MG/DOSE, (OZEMPIC, 1 MG/DOSE,) 2 MG/1.5ML SOPN Inject 1 mg into the skin once a week.  . vitamin C (ASCORBIC ACID) 500 MG tablet Take 500 mg by mouth daily.   No facility-administered  encounter medications on file as of 06/10/2020.    Review of Systems:  Review of Systems  Constitutional: Negative for chills, fever and malaise/fatigue.  HENT: Positive for hearing loss. Negative for congestion and sore throat.   Eyes: Negative for blurred vision.  Respiratory: Negative for cough and shortness of breath.   Cardiovascular: Positive for leg swelling. Negative for chest pain, palpitations, orthopnea and PND.  Gastrointestinal: Negative for abdominal pain, blood in stool, constipation, diarrhea and melena.  Genitourinary: Negative for dysuria.  Musculoskeletal: Negative for falls and joint pain.  Skin: Negative for itching and rash.  Neurological: Positive for dizziness. Negative for loss of consciousness.       Chronic right foot drop  Endo/Heme/Allergies: Bruises/bleeds easily.  Psychiatric/Behavioral: Positive for depression and memory loss. The patient is not nervous/anxious and does not have insomnia.     Health Maintenance  Topic Date Due  . FOOT EXAM  11/05/2020  . HEMOGLOBIN A1C  12/04/2020  . OPHTHALMOLOGY EXAM  06/06/2021  . TETANUS/TDAP  11/11/2021  . INFLUENZA VACCINE  Completed  . DEXA SCAN  Completed  . PNA vac Low Risk Adult  Completed  . COVID-19 Vaccine  Discontinued    Physical Exam: Vitals:   06/10/20 0954  BP: 122/68  Pulse: 88  Temp: (!) 97.5 F (36.4 C)  SpO2: 99%  Weight: 197 lb 8 oz (89.6 kg)  Height: 5\' 2"  (1.575 m)   Body mass index is 36.12 kg/m. Physical Exam Vitals reviewed.  Constitutional:      Appearance: Normal appearance. She is obese.  Eyes:     Extraocular Movements: Extraocular movements intact.     Pupils: Pupils are equal, round, and reactive to light.  Cardiovascular:     Rate and Rhythm: Normal rate and regular rhythm.     Pulses: Normal pulses.     Heart sounds: Normal heart sounds.  Pulmonary:     Effort: Pulmonary effort is normal.     Breath sounds: Normal breath sounds. No wheezing, rhonchi or rales.   Abdominal:     General: Bowel sounds are normal.  Musculoskeletal:        General: Normal range of motion.     Cervical back: Neck supple.     Comments: Very mild nonpitting edema of bilateral lower legs, slightly greater on left (prior imaging completed); using cane for appt  Lymphadenopathy:     Cervical: No cervical adenopathy.  Skin:    General: Skin is warm and dry.     Comments: Areas healing on right thigh from cryotherapy for keratoses  Neurological:     General: No focal deficit present.     Mental Status: She is alert and oriented to person, place, and time.     Cranial Nerves: No cranial nerve deficit.     Sensory: Sensory deficit present.     Motor: No weakness.     Gait: Gait abnormal.     Comments: Right foot drop  Psychiatric:     Comments: Gets sad and tearful talking about being alone     Labs reviewed: Basic Metabolic Panel: Recent Labs    11/01/19 0858 02/06/20 0808 06/06/20 1001  NA 141 140 142  K 3.9 4.2 3.8  CL 101 100 102  CO2 31 30 30   GLUCOSE 166* 212* 81  BUN 22 19 18   CREATININE 0.97* 1.01* 0.86  CALCIUM 9.5 9.5 9.4   Liver Function Tests: Recent Labs    11/01/19 0858 06/06/20 1001  AST 15 13  ALT 22 14  BILITOT 0.6 0.5  PROT 6.2 6.2   No results for input(s): LIPASE, AMYLASE in the last 8760 hours. No results for input(s): AMMONIA in the last 8760 hours. CBC: Recent Labs    11/01/19 0858 02/06/20 0808 06/06/20 1001  WBC 8.8 8.3 8.0  NEUTROABS 5,843 5,611 5,392  HGB 14.2 13.8 13.3  HCT 44.3 42.7 40.6  MCV 91.2 92.0 89.2  PLT 340 368 349   Lipid Panel: Recent Labs    11/01/19 0858 06/06/20 1001  CHOL 149 134  HDL 49* 51  LDLCALC 69 63  TRIG 215* 117  CHOLHDL 3.0 2.6   Lab Results  Component Value Date   HGBA1C 6.7 (H) 06/06/2020    Procedures since last visit: OCT, Retina - OU - Both Eyes  Result Date: 06/06/2020 Right Eye Quality was good. Scan locations included subfoveal. Central Foveal Thickness: 221.  Progression has been stable. Findings include abnormal foveal contour. Left Eye Quality was good. Scan locations included subfoveal. Central Foveal Thickness: 240. Progression has been stable. Findings include abnormal foveal contour, epiretinal membrane. Notes [facility] temporal, not center involved will observe Epiretinal membrane, nasal to FAZ, not center involved will observe   Assessment/Plan 1. Controlled diabetes mellitus with nephropathy (HCC) Lab Results  Component Value Date   HGBA1C 6.7 (H) 06/06/2020  -well controlled and improved since last time -continue same regimen  -no reports of lows -retinopathy followed by Dr. Zadie Rhine  2. MCI (mild cognitive impairment) -scores perfect on MMSE but does have subjective complaints about short-term memory -affected by depression also  3. Coronary artery disease involving native coronary artery of native heart with angina pectoris ([place]) -Takotsubo's cardiomyopathy per notes  4. Essential hypertension, benign -bp controlled with current regimen  5. Venous insufficiency -mild, counseled about elevation of feet -lives alone and doubt she'd ever be able to put on compression hose and cannot afford home health aid help -refuses thn social work assistance offered to help with social situation  6. Unsteady gait -ongoing, not using cane at home, but furniture surfing, uses cane out  7. Counseled about COVID-19 virus infection -again extensively counseled about importance of covid vaccine, risks of not taking including death, ventilator, ongoing severe social deprivation -side effects of vaccine discussed and counseled about resting and quick recovery that typically occur after vaccine  8. High risk social situation -ongoing, recommended [facility] social work involvement to help with her transition to retirement community, consideration of other home services -does have life alert button and sons that check on her but don't come inside b/c of  covid risk since she has refused vaccinations  Educated on AWV upcoming  Labs/tests ordered:  Will order same day as appt  Next appt:  10/07/2020   Dennys Traughber L. Abdulai Blaylock, D.O. [facility] [address], [place] [address] Cell Phone (Mon-Fri 8am-5pm):  [phone] On Call:  [phone] & follow prompts after 5pm & weekends Office Phone:  [phone] Office Fax:  [phone]

## 2020-06-13 ENCOUNTER — Encounter: Payer: Medicare Other | Admitting: Nurse Practitioner

## 2020-06-13 ENCOUNTER — Other Ambulatory Visit: Payer: Self-pay

## 2020-06-13 ENCOUNTER — Telehealth: Payer: Self-pay | Admitting: *Deleted

## 2020-06-13 MED ORDER — FREESTYLE LIBRE 2 READER DEVI: 0 refills | Status: DC

## 2020-06-13 MED ORDER — FREESTYLE LIBRE 2 SENSOR MISC: 11 refills | Status: DC

## 2020-06-13 NOTE — Telephone Encounter (Signed)
Received Detailed Written Order and Certificate of Medical Necessity from [facility] regarding the [place] 2 Reader and Sensors.  Placed in Dr. Cyndi Lennert folder to review and sign.  To be faxed back to:[phone] once completed.

## 2020-06-13 NOTE — Telephone Encounter (Signed)
Patient requested meters. Faxed to [facility].   Patient also stated that she needed to add a medication to her medication list that her Eye dr. Hanley Seamen her this week. Medication list updated.

## 2020-07-02 NOTE — Telephone Encounter (Signed)
Received fax from [facility] stating that they DID NOT APPROVE the request for Dexcom G6 or Freestyle Libre.

## 2020-07-22 ENCOUNTER — Encounter: Payer: Self-pay | Admitting: Internal Medicine

## 2020-07-24 ENCOUNTER — Ambulatory Visit: Payer: Medicare Other | Admitting: Cardiovascular Disease

## 2020-07-25 DIAGNOSIS — H18413 Arcus senilis, bilateral: Secondary | ICD-10-CM | POA: Diagnosis not present

## 2020-07-25 DIAGNOSIS — H16223 Keratoconjunctivitis sicca, not specified as Sjogren's, bilateral: Secondary | ICD-10-CM | POA: Diagnosis not present

## 2020-07-25 DIAGNOSIS — H40053 Ocular hypertension, bilateral: Secondary | ICD-10-CM | POA: Diagnosis not present

## 2020-07-25 DIAGNOSIS — Z961 Presence of intraocular lens: Secondary | ICD-10-CM | POA: Diagnosis not present

## 2020-08-06 ENCOUNTER — Telehealth: Payer: Self-pay | Admitting: Internal Medicine

## 2020-08-06 DIAGNOSIS — Z609 Problem related to social environment, unspecified: Secondary | ICD-10-CM

## 2020-08-06 DIAGNOSIS — I872 Venous insufficiency (chronic) (peripheral): Secondary | ICD-10-CM

## 2020-08-06 DIAGNOSIS — I10 Essential (primary) hypertension: Secondary | ICD-10-CM

## 2020-08-06 DIAGNOSIS — E1121 Type 2 diabetes mellitus with diabetic nephropathy: Secondary | ICD-10-CM

## 2020-08-06 DIAGNOSIS — G3184 Mild cognitive impairment, so stated: Secondary | ICD-10-CM

## 2020-08-06 DIAGNOSIS — R2681 Unsteadiness on feet: Secondary | ICD-10-CM

## 2020-08-06 DIAGNOSIS — M216X1 Other acquired deformities of right foot: Secondary | ICD-10-CM

## 2020-08-06 DIAGNOSIS — I25119 Atherosclerotic heart disease of native coronary artery with unspecified angina pectoris: Secondary | ICD-10-CM

## 2020-08-06 NOTE — Telephone Encounter (Signed)
Pt called stating you would send referral for her to have PCP care changed to Dr Reynold Bowen.  Dr Forde Dandy is only excepting pts by referral  Thanks, Vilinda Blanks

## 2020-08-06 NOTE — Telephone Encounter (Signed)
Referral entered per pt request.  

## 2020-08-29 ENCOUNTER — Other Ambulatory Visit: Payer: Self-pay | Admitting: Internal Medicine

## 2020-08-29 DIAGNOSIS — K219 Gastro-esophageal reflux disease without esophagitis: Secondary | ICD-10-CM

## 2020-09-02 ENCOUNTER — Other Ambulatory Visit: Payer: Self-pay | Admitting: Internal Medicine

## 2020-09-02 DIAGNOSIS — M545 Low back pain, unspecified: Secondary | ICD-10-CM

## 2020-09-02 DIAGNOSIS — G8929 Other chronic pain: Secondary | ICD-10-CM

## 2020-09-02 NOTE — Telephone Encounter (Signed)
High risk or very high risk warning populated when attempting to refill medication. RX request sent to PCP for review and approval if warranted.   

## 2020-10-04 ENCOUNTER — Other Ambulatory Visit: Payer: Medicare Other

## 2020-10-04 ENCOUNTER — Other Ambulatory Visit: Payer: Self-pay | Admitting: Orthopedic Surgery

## 2020-10-04 DIAGNOSIS — E1121 Type 2 diabetes mellitus with diabetic nephropathy: Secondary | ICD-10-CM

## 2020-10-04 DIAGNOSIS — I10 Essential (primary) hypertension: Secondary | ICD-10-CM

## 2020-10-07 ENCOUNTER — Other Ambulatory Visit: Payer: Medicare Other

## 2020-10-07 ENCOUNTER — Ambulatory Visit: Payer: Medicare Other | Admitting: Internal Medicine

## 2020-10-08 ENCOUNTER — Other Ambulatory Visit: Payer: Medicare Other

## 2020-10-08 ENCOUNTER — Other Ambulatory Visit: Payer: Self-pay

## 2020-10-08 DIAGNOSIS — E1121 Type 2 diabetes mellitus with diabetic nephropathy: Secondary | ICD-10-CM

## 2020-10-08 DIAGNOSIS — I10 Essential (primary) hypertension: Secondary | ICD-10-CM

## 2020-10-09 ENCOUNTER — Ambulatory Visit (INDEPENDENT_AMBULATORY_CARE_PROVIDER_SITE_OTHER): Payer: Medicare Other | Admitting: Family Medicine

## 2020-10-09 ENCOUNTER — Encounter: Payer: Self-pay | Admitting: Family Medicine

## 2020-10-09 ENCOUNTER — Ambulatory Visit: Payer: Medicare Other | Admitting: Internal Medicine

## 2020-10-09 VITALS — BP 128/80 | HR 57 | Temp 97.1°F | Resp 18 | Ht 62.0 in | Wt 199.8 lb

## 2020-10-09 DIAGNOSIS — E1121 Type 2 diabetes mellitus with diabetic nephropathy: Secondary | ICD-10-CM | POA: Diagnosis not present

## 2020-10-09 DIAGNOSIS — I25119 Atherosclerotic heart disease of native coronary artery with unspecified angina pectoris: Secondary | ICD-10-CM | POA: Diagnosis not present

## 2020-10-09 DIAGNOSIS — H6123 Impacted cerumen, bilateral: Secondary | ICD-10-CM | POA: Diagnosis not present

## 2020-10-09 DIAGNOSIS — I10 Essential (primary) hypertension: Secondary | ICD-10-CM | POA: Diagnosis not present

## 2020-10-09 LAB — COMPREHENSIVE METABOLIC PANEL
AG Ratio: 1.6 (calc) (ref 1.0–2.5)
ALT: 15 U/L (ref 6–29)
AST: 14 U/L (ref 10–35)
Albumin: 3.6 g/dL (ref 3.6–5.1)
Alkaline phosphatase (APISO): 79 U/L (ref 37–153)
BUN: 18 mg/dL (ref 7–25)
CO2: 31 mmol/L (ref 20–32)
Calcium: 9.1 mg/dL (ref 8.6–10.4)
Chloride: 104 mmol/L (ref 98–110)
Creat: 0.85 mg/dL (ref 0.60–0.88)
Globulin: 2.3 g/dL (calc) (ref 1.9–3.7)
Glucose, Bld: 77 mg/dL (ref 65–99)
Potassium: 3.9 mmol/L (ref 3.5–5.3)
Sodium: 140 mmol/L (ref 135–146)
Total Bilirubin: 0.5 mg/dL (ref 0.2–1.2)
Total Protein: 5.9 g/dL — ABNORMAL LOW (ref 6.1–8.1)

## 2020-10-09 LAB — CBC WITH DIFFERENTIAL/PLATELET
Absolute Monocytes: 450 cells/uL (ref 200–950)
Basophils Absolute: 60 cells/uL (ref 0–200)
Basophils Relative: 0.8 %
Eosinophils Absolute: 120 cells/uL (ref 15–500)
Eosinophils Relative: 1.6 %
HCT: 39.4 % (ref 35.0–45.0)
Hemoglobin: 12.5 g/dL (ref 11.7–15.5)
Lymphs Abs: 2490 cells/uL (ref 850–3900)
MCH: 29.1 pg (ref 27.0–33.0)
MCHC: 31.7 g/dL — ABNORMAL LOW (ref 32.0–36.0)
MCV: 91.8 fL (ref 80.0–100.0)
MPV: 11.7 fL (ref 7.5–12.5)
Monocytes Relative: 6 %
Neutro Abs: 4380 cells/uL (ref 1500–7800)
Neutrophils Relative %: 58.4 %
Platelets: 335 10*3/uL (ref 140–400)
RBC: 4.29 10*6/uL (ref 3.80–5.10)
RDW: 12.9 % (ref 11.0–15.0)
Total Lymphocyte: 33.2 %
WBC: 7.5 10*3/uL (ref 3.8–10.8)

## 2020-10-09 LAB — HEMOGLOBIN A1C
Hgb A1c MFr Bld: 6.4 % of total Hgb — ABNORMAL HIGH (ref <5.7)
Mean Plasma Glucose: 137 mg/dL
eAG (mmol/L): 7.6 mmol/L

## 2020-10-09 NOTE — Patient Instructions (Signed)
Stop diabetes pill and monitor sugars Take Mucinex dor drainage

## 2020-10-09 NOTE — Progress Notes (Signed)
Provider:  Alain Honey, MD  Careteam: Patient Care Team: Wardell Honour, MD as PCP - General (Family Medicine) Sherren Mocha, MD as PCP - Cardiology (Cardiology) Excell Seltzer, MD (Inactive) as Consulting Physician (General Surgery) Tanda Rockers, MD as Consulting Physician (Pulmonary Disease) Ronald Lobo, MD as Consulting Physician (Gastroenterology) Jacolyn Reedy, MD as Consulting Physician (Cardiology) Zadie Rhine Clent Demark, MD as Consulting Physician (Ophthalmology) Darleen Crocker, MD as Consulting Physician (Ophthalmology)  PLACE OF SERVICE:  [place] Directive information Does Patient Have a Medical Advance Directive?: Yes, Type of Advance Directive: Out of facility DNR (pink MOST or yellow form), Does patient want to make changes to medical advance directive?: No - Patient declined  Allergies  Allergen Reactions  . Ciprofloxacin Rash  . Advair Diskus [Fluticasone-Salmeterol] Other (See Comments)    Throat closes  . Albuterol     REACTION: closes throat  . Alendronate Sodium Itching  . Benadryl [Diphenhydramine Hcl] Itching  . Benzonatate     REACTION: rash/hives  . Cephalexin Nausea Only  . Gabapentin Other (See Comments)    Loss of memory  . Iohexol      Desc: rash and DIF BREATHING   . Keflex [Cephalexin] Nausea And Vomiting  . Morphine Nausea And Vomiting  . Other Other (See Comments)    Decongestants- keeps pt awake at night, increased heart rate  . Propoxyphene Hcl     Doesn't recall  . Reclast [Zoledronic Acid] Other (See Comments)    Chest pain  . Avapro [Irbesartan] Rash  . Codeine Nausea And Vomiting, Swelling and Rash  . Tessalon Perles Rash  . Tramadol Nausea Only and Rash    Chief Complaint  Patient presents with  . Medical Management of Chronic Issues    4 month follow up.     HPI: Patient is a 80 y.o. female 80 year old lady who lives alone.  There has been some suggestion that she has cognitive impairment  but she really seems with it today.  Her last MMSE was 30/30. She has type 2 diabetes but A1c's have been less than 7 recently she shows me records of her sugars that do show some fairly low sugars.  She has continued with glipizide.  She is also requesting to check her ears today as her hearing has declined recently.  Review of Systems:  Review of Systems  Constitutional: Negative.   HENT: Negative.   Eyes: Negative.   Respiratory: Negative.   Cardiovascular: Positive for leg swelling.  Gastrointestinal: Negative.   Genitourinary: Negative.   Musculoskeletal: Negative.   Skin: Negative.   All other systems reviewed and are negative.   Past Medical History:  Diagnosis Date  . Abdominal pain, unspecified site   . Abnormality of gait   . Apnea   . Cardiomegaly   . Chest pain, unspecified   . Complication of anesthesia    hard time waking up  . Depressive disorder, not elsewhere classified   . Diabetes mellitus   . Diabetic retinopathy ([place])   . Dizziness and giddiness   . Dyskinesia of esophagus   . Edema   . Extrinsic asthma, unspecified   . Female stress incontinence   . Gout, unspecified   . High cholesterol   . Hypertension   . Lipoma of other skin and subcutaneous tissue   . Lumbago   . Memory loss   . Migraine without aura, with intractable migraine, so stated, with status migrainosus   . Mild cognitive impairment,  so stated   . Nonspecific (abnormal) findings on radiological and other examination of abdominal area, including retroperitoneum   . Nonspecific abnormal results of liver function study   . Obesity, unspecified   . Obstructive chronic bronchitis with exacerbation ([place])   . Other and unspecified hyperlipidemia   . Other B-complex deficiencies   . Other nonspecific abnormal serum enzyme levels   . Other specified cardiac dysrhythmias(427.89)   . Other specified disease of sebaceous glands   . Other symptoms involving cardiovascular system   . Pain in  joint, ankle and foot   . Pain in joint, pelvic region and thigh   . Pain in joint, shoulder region   . Palpitations   . Reflux esophagitis   . Shortness of breath   . Tension headache   . Type I (juvenile type) diabetes mellitus without mention of complication, not stated as uncontrolled   . Type I (juvenile type) diabetes mellitus without mention of complication, uncontrolled   . Unspecified essential hypertension   . Unspecified hypothyroidism   . Unspecified vitamin D deficiency   . Ventral hernia, unspecified, without mention of obstruction or gangrene    Past Surgical History:  Procedure Laterality Date  . ABDOMINAL HYSTERECTOMY    . APPENDECTOMY    . BREAST SURGERY    . EXPLORATORY LAPAROTOMY W/ BOWEL RESECTION  07/25/2004   PROCEDURE: Laparoscopy, open laparotomy, resection of jejunojejunostomy  . INCISIONAL HERNIA REPAIR  03/19/2006   PROCEDURE: Open ventral hernia repair with mesh.  Marland Kitchen LAPAROSCOPIC GASTRIC BYPASS  07/22/2004   PROCEDURE: Laparoscopic Roux-en-Y gastric bypass, antecolic, antegastric,  . LAPAROSCOPIC INCISIONAL / UMBILICAL / VENTRAL HERNIA REPAIR  09/22/2005   PROCEDURE: Laparoscopic ventral hernia repair with mesh.  Marland Kitchen LEFT HEART CATH AND CORONARY ANGIOGRAPHY N/A 11/20/2016   Procedure: Left Heart Cath and Coronary Angiography;  Surgeon: Sherren Mocha, MD;  Location: [facility];  Service: Cardiovascular;  Laterality: N/A;  . LEFT HEART CATHETERIZATION WITH CORONARY ANGIOGRAM N/A 08/21/2014   Procedure: LEFT HEART CATHETERIZATION WITH CORONARY ANGIOGRAM;  Surgeon: Sherren Mocha, MD;  Location: [facility]ATH [facility];  Service: Cardiovascular;  Laterality: N/A;  . stents     last time she said was 1 yr ago    Social History:   reports that she has never smoked. She has never used smokeless tobacco. She reports that she does not drink alcohol and does not use drugs.  Family History  Problem Relation Age of Onset  . Stroke Mother   . Stroke Father   .  Diabetes Father   . Heart disease Father   . Diabetes Son   . Cancer Brother        BLADDER  . Diabetes Brother     Medications: Patient's Medications  New Prescriptions   No medications on file  Previous Medications   AMLODIPINE (NORVASC) 2.5 MG TABLET    Take 1 tablet (2.5 mg total) by mouth daily.   ASPIRIN 81 MG TABLET    Take 81 mg by mouth daily.   ATORVASTATIN (LIPITOR) 20 MG TABLET    Take 1 tablet (20 mg total) by mouth daily.   CALCIUM CITRATE-VITAMIN D (CALCIUM CITRATE + D PO)    Take by mouth daily.   CETIRIZINE (ZYRTEC) 10 MG TABLET    Take 10 mg by mouth daily as needed for allergies. For allergies   CHOLECALCIFEROL (VITAMIN D3) 5000 UNITS CAPS    Take 1 tablet by mouth daily. For supplement   CINNAMON PO  Take 1,000 mg by mouth daily.   CITALOPRAM (CELEXA) 40 MG TABLET    TAKE 1 TABLET BY MOUTH EVERY DAY FOR DEPRESSION   CONTINUOUS BLOOD GLUC RECEIVER (FREESTYLE LIBRE 2 READER) DEVI    Use to test blood sugar three times daily.  Dx: E11.9   CONTINUOUS BLOOD GLUC SENSOR (FREESTYLE LIBRE 2 SENSOR) MISC    Use to test blood sugar three times daily. Dx: E11.9   DOCUSATE SODIUM (COLACE) 100 MG CAPSULE    Take 100 mg by mouth 2 (two) times daily.   DONEPEZIL (ARICEPT) 5 MG TABLET    Take 1 tablet (5 mg total) by mouth at bedtime.   FERROUS SULFATE 325 (65 FE) MG TABLET    Take 325 mg by mouth 2 (two) times daily with a meal.   FUROSEMIDE (LASIX) 20 MG TABLET    Take 20 mg by mouth.   GLIPIZIDE (GLUCOTROL XL) 10 MG 24 HR TABLET    TAKE 1 TABLET BY MOUTH EVERY DAY IN THE MORNING AND 1 TABLET IN EVENING FOR SUGAR CONTROL   INSULIN NPH HUMAN (HUMULIN N) 100 UNIT/ML INJECTION    INJECT 40 UNITS IN THE MORNING AND 40 UNITS IN THE EVENING   INSULIN SYRINGES, DISPOSABLE, U-100 1 ML MISC    Use to inject insulin Dx: S28.3151   ISOSORBIDE MONONITRATE (IMDUR) 30 MG 24 HR TABLET    Take 1 tablet (30 mg total) by mouth daily.   LATANOPROST (XALATAN) 0.005 % OPHTHALMIC SOLUTION    Place 1  drop into both eyes at bedtime.   LOSARTAN-HYDROCHLOROTHIAZIDE (HYZAAR) 50-12.5 MG TABLET    TAKE 1 TABLET BY MOUTH EVERY DAY FOR BLOOD PRESSURE   MAGNESIUM OXIDE 250 MG TABS    Take 500 mg by mouth daily.   MECLIZINE (ANTIVERT) 25 MG TABLET    Take 1 tablet (25 mg total) by mouth 3 (three) times daily as needed for dizziness. For dizziness   NITROGLYCERIN (NITROSTAT) 0.4 MG SL TABLET    Place 0.4 mg under the tongue every 5 (five) minutes as needed for chest pain.   OMEPRAZOLE (PRILOSEC) 20 MG CAPSULE    TAKE 1 CAPSULE BY MOUTH EVERY DAY   PREDNISONE (DELTASONE) 5 MG TABLET    TAKE 1 TABLET BY MOUTH EVERY DAY IN THE MORNING TO REDUCE PAIN   VITAMIN C (ASCORBIC ACID) 500 MG TABLET    Take 500 mg by mouth daily.  Modified Medications   No medications on file  Discontinued Medications   No medications on file    Physical Exam:  Vitals:   10/09/20 0935  BP: 128/80  Pulse: (!) 57  Resp: 18  Temp: (!) 97.1 F (36.2 C)  SpO2: 95%  Weight: 199 lb 12.8 oz (90.6 kg)  Height: 5\' 2"  (1.575 m)   Body mass index is 36.54 kg/m. Wt Readings from Last 3 Encounters:  10/09/20 199 lb 12.8 oz (90.6 kg)  06/10/20 197 lb 8 oz (89.6 kg)  02/13/20 200 lb (90.7 kg)    Physical Exam Vitals and nursing note reviewed.  Constitutional:      Appearance: Normal appearance.  HENT:     Head: Normocephalic.     Ears:     Comments: Both TMs with cerumen which was successfully irrigated Cardiovascular:     Rate and Rhythm: Normal rate and regular rhythm.  Pulmonary:     Effort: Pulmonary effort is normal.     Breath sounds: Normal breath sounds.  Musculoskeletal:  Right lower leg: Edema present.     Left lower leg: Edema present.     Comments: Localized swelling below knees but not distal.Sits with legs elevated mostly  Neurological:     General: No focal deficit present.     Mental Status: She is alert and oriented to person, place, and time.  Psychiatric:        Mood and Affect: Mood  normal.        Behavior: Behavior normal.     Labs reviewed: Basic Metabolic Panel: Recent Labs    02/06/20 0808 06/06/20 1001 10/08/20 0855  NA 140 142 140  K 4.2 3.8 3.9  CL 100 102 104  CO2 30 30 31   GLUCOSE 212* 81 77  BUN 19 18 18   CREATININE 1.01* 0.86 0.85  CALCIUM 9.5 9.4 9.1   Liver Function Tests: Recent Labs    11/01/19 0858 06/06/20 1001 10/08/20 0855  AST 15 13 14   ALT 22 14 15   BILITOT 0.6 0.5 0.5  PROT 6.2 6.2 5.9*   No results for input(s): LIPASE, AMYLASE in the last 8760 hours. No results for input(s): AMMONIA in the last 8760 hours. CBC: Recent Labs    02/06/20 0808 06/06/20 1001 10/08/20 0855  WBC 8.3 8.0 7.5  NEUTROABS 5,611 5,392 4,380  HGB 13.8 13.3 12.5  HCT 42.7 40.6 39.4  MCV 92.0 89.2 91.8  PLT 368 349 335   Lipid Panel: Recent Labs    11/01/19 0858 06/06/20 1001  CHOL 149 134  HDL 49* 51  LDLCALC 69 63  TRIG 215* 117  CHOLHDL 3.0 2.6   TSH: No results for input(s): TSH in the last 8760 hours. A1C: Lab Results  Component Value Date   HGBA1C 6.4 (H) 10/08/2020     Assessment/Plan  1. Bilateral impacted cerumen Hearing improved pst lavage - Ear Lavage  2. Essential hypertension, benign BP 128/80  3. Coronary artery disease involving native coronary artery of native heart with angina pectoris [facility]) Doing well asymmptomatic  4. Controlled diabetes mellitus with nephropathy ([place]) Leave off sulfonylurea since hypoglycemic at times   Alain Honey, MD [place] [phone]

## 2020-10-16 ENCOUNTER — Other Ambulatory Visit: Payer: Self-pay | Admitting: Physician Assistant

## 2020-10-16 ENCOUNTER — Other Ambulatory Visit: Payer: Self-pay | Admitting: Cardiovascular Disease

## 2020-10-31 ENCOUNTER — Other Ambulatory Visit: Payer: Self-pay | Admitting: Nurse Practitioner

## 2020-10-31 DIAGNOSIS — G8929 Other chronic pain: Secondary | ICD-10-CM

## 2020-10-31 DIAGNOSIS — M545 Low back pain, unspecified: Secondary | ICD-10-CM

## 2020-10-31 NOTE — Telephone Encounter (Signed)
Patient has request refill on medication "Prednisone 5mg ". Patient medication was last refilled 09/02/2020. I was unable to send medication into pharmacy due to multiple warnings. Medication pend and sent to Sherrie Mustache, NP due to PCP Sabra Heck Lillette Boxer, MD being out of office.

## 2020-11-03 ENCOUNTER — Other Ambulatory Visit: Payer: Self-pay | Admitting: Physician Assistant

## 2020-11-11 DIAGNOSIS — E1169 Type 2 diabetes mellitus with other specified complication: Secondary | ICD-10-CM | POA: Diagnosis not present

## 2020-11-11 DIAGNOSIS — D649 Anemia, unspecified: Secondary | ICD-10-CM | POA: Diagnosis not present

## 2020-11-11 DIAGNOSIS — K469 Unspecified abdominal hernia without obstruction or gangrene: Secondary | ICD-10-CM | POA: Diagnosis not present

## 2020-11-11 DIAGNOSIS — H9190 Unspecified hearing loss, unspecified ear: Secondary | ICD-10-CM | POA: Diagnosis not present

## 2020-11-11 DIAGNOSIS — R600 Localized edema: Secondary | ICD-10-CM | POA: Diagnosis not present

## 2020-11-11 DIAGNOSIS — G3184 Mild cognitive impairment, so stated: Secondary | ICD-10-CM | POA: Diagnosis not present

## 2020-11-11 DIAGNOSIS — E785 Hyperlipidemia, unspecified: Secondary | ICD-10-CM | POA: Diagnosis not present

## 2020-11-11 DIAGNOSIS — I10 Essential (primary) hypertension: Secondary | ICD-10-CM | POA: Diagnosis not present

## 2020-11-11 DIAGNOSIS — Z79899 Other long term (current) drug therapy: Secondary | ICD-10-CM | POA: Diagnosis not present

## 2020-11-11 DIAGNOSIS — E113393 Type 2 diabetes mellitus with moderate nonproliferative diabetic retinopathy without macular edema, bilateral: Secondary | ICD-10-CM | POA: Diagnosis not present

## 2020-11-11 DIAGNOSIS — F419 Anxiety disorder, unspecified: Secondary | ICD-10-CM | POA: Diagnosis not present

## 2020-11-26 ENCOUNTER — Other Ambulatory Visit: Payer: Self-pay | Admitting: *Deleted

## 2020-11-26 DIAGNOSIS — E1121 Type 2 diabetes mellitus with diabetic nephropathy: Secondary | ICD-10-CM

## 2020-11-26 MED ORDER — INSULIN NPH (HUMAN) (ISOPHANE) 100 UNIT/ML ~~LOC~~ SUSP: SUBCUTANEOUS | 3 refills | Status: DC

## 2020-11-26 NOTE — Telephone Encounter (Signed)
Pharmacy requested refill

## 2020-12-03 ENCOUNTER — Ambulatory Visit (INDEPENDENT_AMBULATORY_CARE_PROVIDER_SITE_OTHER): Payer: Medicare Other | Admitting: Nurse Practitioner

## 2020-12-03 ENCOUNTER — Encounter: Payer: Self-pay | Admitting: Nurse Practitioner

## 2020-12-03 ENCOUNTER — Other Ambulatory Visit: Payer: Self-pay

## 2020-12-03 ENCOUNTER — Telehealth: Payer: Self-pay

## 2020-12-03 DIAGNOSIS — E2839 Other primary ovarian failure: Secondary | ICD-10-CM

## 2020-12-03 DIAGNOSIS — Z0000 Encounter for general adult medical examination without abnormal findings: Secondary | ICD-10-CM

## 2020-12-03 NOTE — Progress Notes (Signed)
This service is provided via telemedicine  No vital signs collected/recorded due to the encounter was a telemedicine visit.   Location of patient (ex: home, work):  Home  Patient consents to a telephone visit:  Yes, see encounter dated 12/03/2020  Location of the provider (ex: office, home): [place]  Name of any referring provider:  Alain Honey, MD  Names of all persons participating in the telemedicine service and their role in the encounter:  Sherrie Mustache, Nurse Practitioner, Carroll Kinds, CMA, and patient.    Time spent on call:  8 minutes with medical assistant

## 2020-12-03 NOTE — Patient Instructions (Signed)
[name] , Thank you for taking time to come for your Medicare Wellness Visit. I appreciate your ongoing commitment to your health goals. Please review the following plan we discussed and let me know if I can assist you in the future.   Screening recommendations/referrals: Colonoscopy aged out Mammogram aged out Bone Density RECOMMENDED- to call [phone] to schedule  Recommended yearly ophthalmology/optometry visit for glaucoma screening and checkup Recommended yearly dental visit for hygiene and checkup  Vaccinations: Influenza vaccine up to date Pneumococcal vaccine up to date Tdap vaccine up to date Shingles vaccine up to date  Advanced directives: on file.   Conditions/risks identified: obesity, advanced age, fall risk  Next appointment: 1 year    Preventive Care 35 Years and Older, Female Preventive care refers to lifestyle choices and visits with your health care provider that can promote health and wellness. What does preventive care include? A yearly physical exam. This is also called an annual well check. Dental exams once or twice a year. Routine eye exams. Ask your health care provider how often you should have your eyes checked. Personal lifestyle choices, including: Daily care of your teeth and gums. Regular physical activity. Eating a healthy diet. Avoiding tobacco and drug use. Limiting alcohol use. Practicing safe sex. Taking low-dose aspirin every day. Taking vitamin and mineral supplements as recommended by your health care provider. What happens during an annual well check? The services and screenings done by your health care provider during your annual well check will depend on your age, overall health, lifestyle risk factors, and family history of disease. Counseling  Your health care provider may ask you questions about your: Alcohol use. Tobacco use. Drug use. Emotional well-being. Home and relationship well-being. Sexual activity. Eating  habits. History of falls. Memory and ability to understand (cognition). Work and work Statistician. Reproductive health. Screening  You may have the following tests or measurements: Height, weight, and BMI. Blood pressure. Lipid and cholesterol levels. These may be checked every 5 years, or more frequently if you are over 34 years old. Skin check. Lung cancer screening. You may have this screening every year starting at age 53 if you have a 30-pack-year history of smoking and currently smoke or have quit within the past 15 years. Fecal occult blood test (FOBT) of the stool. You may have this test every year starting at age 59. Flexible sigmoidoscopy or colonoscopy. You may have a sigmoidoscopy every 5 years or a colonoscopy every 10 years starting at age 101. Hepatitis C blood test. Hepatitis B blood test. Sexually transmitted disease (STD) testing. Diabetes screening. This is done by checking your blood sugar (glucose) after you have not eaten for a while (fasting). You may have this done every 1-3 years. Bone density scan. This is done to screen for osteoporosis. You may have this done starting at age 26. Mammogram. This may be done every 1-2 years. Talk to your health care provider about how often you should have regular mammograms. Talk with your health care provider about your test results, treatment options, and if necessary, the need for more tests. Vaccines  Your health care provider may recommend certain vaccines, such as: Influenza vaccine. This is recommended every year. Tetanus, diphtheria, and acellular pertussis (Tdap, Td) vaccine. You may need a Td booster every 10 years. Zoster vaccine. You may need this after age 23. Pneumococcal 13-valent conjugate (PCV13) vaccine. One dose is recommended after age 70. Pneumococcal polysaccharide (PPSV23) vaccine. One dose is recommended after age 40. Talk  to your health care provider about which screenings and vaccines you need and how  often you need them. This information is not intended to replace advice given to you by your health care provider. Make sure you discuss any questions you have with your health care provider. Document Released: 06/14/2015 Document Revised: 02/05/2016 Document Reviewed: 03/19/2015 Elsevier Interactive Patient Education  2017 [place] Prevention in the Home Falls can cause injuries. They can happen to people of all ages. There are many things you can do to make your home safe and to help prevent falls. What can I do on the outside of my home? Regularly fix the edges of walkways and driveways and fix any cracks. Remove anything that might make you trip as you walk through a door, such as a raised step or threshold. Trim any bushes or trees on the path to your home. Use bright outdoor lighting. Clear any walking paths of anything that might make someone trip, such as rocks or tools. Regularly check to see if handrails are loose or broken. Make sure that both sides of any steps have handrails. Any raised decks and porches should have guardrails on the edges. Have any leaves, snow, or ice cleared regularly. Use sand or salt on walking paths during winter. Clean up any spills in your garage right away. This includes oil or grease spills. What can I do in the bathroom? Use night lights. Install grab bars by the toilet and in the tub and shower. Do not use towel bars as grab bars. Use non-skid mats or decals in the tub or shower. If you need to sit down in the shower, use a plastic, non-slip stool. Keep the floor dry. Clean up any water that spills on the floor as soon as it happens. Remove soap buildup in the tub or shower regularly. Attach bath mats securely with double-sided non-slip rug tape. Do not have throw rugs and other things on the floor that can make you trip. What can I do in the bedroom? Use night lights. Make sure that you have a light by your bed that is easy to  reach. Do not use any sheets or blankets that are too big for your bed. They should not hang down onto the floor. Have a firm chair that has side arms. You can use this for support while you get dressed. Do not have throw rugs and other things on the floor that can make you trip. What can I do in the kitchen? Clean up any spills right away. Avoid walking on wet floors. Keep items that you use a lot in easy-to-reach places. If you need to reach something above you, use a strong step stool that has a grab bar. Keep electrical cords out of the way. Do not use floor polish or wax that makes floors slippery. If you must use wax, use non-skid floor wax. Do not have throw rugs and other things on the floor that can make you trip. What can I do with my stairs? Do not leave any items on the stairs. Make sure that there are handrails on both sides of the stairs and use them. Fix handrails that are broken or loose. Make sure that handrails are as long as the stairways. Check any carpeting to make sure that it is firmly attached to the stairs. Fix any carpet that is loose or worn. Avoid having throw rugs at the top or bottom of the stairs. If you do have throw rugs,  attach them to the floor with carpet tape. Make sure that you have a light switch at the top of the stairs and the bottom of the stairs. If you do not have them, ask someone to add them for you. What else can I do to help prevent falls? Wear shoes that: Do not have high heels. Have rubber bottoms. Are comfortable and fit you well. Are closed at the toe. Do not wear sandals. If you use a stepladder: Make sure that it is fully opened. Do not climb a closed stepladder. Make sure that both sides of the stepladder are locked into place. Ask someone to hold it for you, if possible. Clearly mark and make sure that you can see: Any grab bars or handrails. First and last steps. Where the edge of each step is. Use tools that help you move  around (mobility aids) if they are needed. These include: Canes. Walkers. Scooters. Crutches. Turn on the lights when you go into a dark area. Replace any light bulbs as soon as they burn out. Set up your furniture so you have a clear path. Avoid moving your furniture around. If any of your floors are uneven, fix them. If there are any pets around you, be aware of where they are. Review your medicines with your doctor. Some medicines can make you feel dizzy. This can increase your chance of falling. Ask your doctor what other things that you can do to help prevent falls. This information is not intended to replace advice given to you by your health care provider. Make sure you discuss any questions you have with your health care provider. Document Released: 03/14/2009 Document Revised: 10/24/2015 Document Reviewed: 06/22/2014 Elsevier Interactive Patient Education  2017 [employer].

## 2020-12-03 NOTE — Telephone Encounter (Signed)
Ms. cheyrl, buley are scheduled for a virtual visit with your provider today.    Just as we do with appointments in the office, we must obtain your consent to participate.  Your consent will be active for this visit and any virtual visit you may have with one of our providers in the next 365 days.    If you have a MyChart account, I can also send a copy of this consent to you electronically.  All virtual visits are billed to your insurance company just like a traditional visit in the office.  As this is a virtual visit, video technology does not allow for your provider to perform a traditional examination.  This may limit your provider's ability to fully assess your condition.  If your provider identifies any concerns that need to be evaluated in person or the need to arrange testing such as labs, EKG, etc, we will make arrangements to do so.    Although advances in technology are sophisticated, we cannot ensure that it will always work on either your end or our end.  If the connection with a video visit is poor, we may have to switch to a telephone visit.  With either a video or telephone visit, we are not always able to ensure that we have a secure connection.   I need to obtain your verbal consent now.   Are you willing to proceed with your visit today?   SANJNA HASKEW has provided verbal consent on 12/03/2020 for a virtual visit (video or telephone).   Carroll Kinds, CMA 12/03/2020  9:24 AM

## 2020-12-03 NOTE — Progress Notes (Signed)
Subjective:   [name] is a 80 y.o. female who presents for Medicare Annual (Subsequent) preventive examination.  Review of Systems           Objective:    There were no vitals filed for this visit. There is no height or weight on file to calculate BMI.  Advanced Directives 12/03/2020 10/09/2020 06/10/2020 02/08/2020 11/21/2019 11/06/2019 11/11/2018  Does Patient Have a Medical Advance Directive? Yes Yes Yes Yes Yes Yes Yes  Type of Advance Directive Out of facility DNR (pink MOST or yellow form) Out of facility DNR (pink MOST or yellow form) Out of facility DNR (pink MOST or yellow form) Out of facility DNR (pink MOST or yellow form) Out of facility DNR (pink MOST or yellow form) Out of facility DNR (pink MOST or yellow form) -  Does patient want to make changes to medical advance directive? No - Patient declined No - Patient declined No - Patient declined No - Patient declined No - Patient declined No - Patient declined No - Patient declined  Copy of [place] in [place]  Would patient like information on creating a medical advance directive? - - - - - - -  Pre-existing out of facility DNR order (yellow form or pink MOST form) Yellow form placed in chart (order not valid for inpatient use) - Pink MOST/Yellow Form most recent copy in chart - Physician notified to receive inpatient order - Yellow form placed in chart (order not valid for inpatient use) - -    Current Medications (verified) Outpatient Encounter Medications as of 12/03/2020  Medication Sig   amLODipine (NORVASC) 2.5 MG tablet TAKE 1 TABLET BY MOUTH EVERY DAY   aspirin 81 MG tablet Take 81 mg by mouth daily.   atorvastatin (LIPITOR) 20 MG tablet TAKE 1 TABLET BY MOUTH EVERY DAY   Calcium Citrate-Vitamin D (CALCIUM CITRATE + D PO) Take by mouth daily.   cetirizine (ZYRTEC) 10 MG tablet Take 10 mg by mouth daily as needed for allergies. For allergies   Cholecalciferol (VITAMIN D3) 5000 UNITS CAPS  Take 1 tablet by mouth daily. For supplement   CINNAMON PO Take 1,000 mg by mouth daily.   citalopram (CELEXA) 40 MG tablet TAKE 1 TABLET BY MOUTH EVERY DAY FOR DEPRESSION   Continuous Blood Gluc Receiver (FREESTYLE LIBRE 2 READER) DEVI Use to test blood sugar three times daily.  Dx: E11.9   Continuous Blood Gluc Sensor (FREESTYLE LIBRE 2 SENSOR) MISC Use to test blood sugar three times daily. Dx: E11.9   docusate sodium (COLACE) 100 MG capsule Take 100 mg by mouth 2 (two) times daily.   donepezil (ARICEPT) 5 MG tablet Take 1 tablet (5 mg total) by mouth at bedtime.   ferrous sulfate 325 (65 FE) MG tablet Take 325 mg by mouth 2 (two) times daily with a meal.   furosemide (LASIX) 20 MG tablet Take 20 mg by mouth.   glipiZIDE (GLUCOTROL XL) 10 MG 24 hr tablet TAKE 1 TABLET BY MOUTH EVERY DAY IN THE MORNING AND 1 TABLET IN EVENING FOR SUGAR CONTROL   insulin NPH Human (HUMULIN N) 100 UNIT/ML injection INJECT 40 UNITS IN THE MORNING AND 40 UNITS IN THE EVENING   Insulin Syringes, Disposable, U-100 1 ML MISC Use to inject insulin Dx: W29.5621   isosorbide mononitrate (IMDUR) 30 MG 24 hr tablet TAKE 1 TABLET BY MOUTH EVERY DAY   latanoprost (XALATAN) 0.005 % ophthalmic solution  Place 1 drop into both eyes at bedtime.   losartan-hydrochlorothiazide (HYZAAR) 50-12.5 MG tablet TAKE 1 TABLET BY MOUTH EVERY DAY FOR BLOOD PRESSURE   Magnesium Oxide 250 MG TABS Take 500 mg by mouth daily.   meclizine (ANTIVERT) 25 MG tablet Take 1 tablet (25 mg total) by mouth 3 (three) times daily as needed for dizziness. For dizziness   nitroGLYCERIN (NITROSTAT) 0.4 MG SL tablet Place 0.4 mg under the tongue every 5 (five) minutes as needed for chest pain.   omeprazole (PRILOSEC) 20 MG capsule TAKE 1 CAPSULE BY MOUTH EVERY DAY   predniSONE (DELTASONE) 5 MG tablet TAKE 1 TABLET BY MOUTH EVERY DAY IN THE MORNING TO REDUCE PAIN   vitamin C (ASCORBIC ACID) 500 MG tablet Take 500 mg by mouth daily.   No facility-administered  encounter medications on file as of 12/03/2020.    Allergies (verified) Ciprofloxacin, Advair diskus [fluticasone-salmeterol], Albuterol, Alendronate sodium, Benadryl [diphenhydramine hcl], Benzonatate, Cephalexin, Gabapentin, Iohexol, Keflex [cephalexin], Morphine, Other, Propoxyphene hcl, Reclast [zoledronic acid], Avapro [irbesartan], Codeine, Tessalon perles, and Tramadol   History: Past Medical History:  Diagnosis Date   Abdominal pain, unspecified site    Abnormality of gait    Apnea    Cardiomegaly    Chest pain, unspecified    Complication of anesthesia    hard time waking up   Depressive disorder, not elsewhere classified    Diabetes mellitus    Diabetic retinopathy (HCC)    Dizziness and giddiness    Dyskinesia of esophagus    Edema    Extrinsic asthma, unspecified    Female stress incontinence    Gout, unspecified    High cholesterol    Hypertension    Lipoma of other skin and subcutaneous tissue    Lumbago    Memory loss    Migraine without aura, with intractable migraine, so stated, with status migrainosus    Mild cognitive impairment, so stated    Nonspecific (abnormal) findings on radiological and other examination of abdominal area, including retroperitoneum    Nonspecific abnormal results of liver function study    Obesity, unspecified    Obstructive chronic bronchitis with exacerbation (HCC)    Other and unspecified hyperlipidemia    Other B-complex deficiencies    Other nonspecific abnormal serum enzyme levels    Other specified cardiac dysrhythmias(427.89)    Other specified disease of sebaceous glands    Other symptoms involving cardiovascular system    Pain in joint, ankle and foot    Pain in joint, pelvic region and thigh    Pain in joint, shoulder region    Palpitations    Reflux esophagitis    Shortness of breath    Tension headache    Type I (juvenile type) diabetes mellitus without mention of complication, not stated as uncontrolled    Type I  (juvenile type) diabetes mellitus without mention of complication, uncontrolled    Unspecified essential hypertension    Unspecified hypothyroidism    Unspecified vitamin D deficiency    Ventral hernia, unspecified, without mention of obstruction or gangrene    Past Surgical History:  Procedure Laterality Date   ABDOMINAL HYSTERECTOMY     APPENDECTOMY     BREAST SURGERY     EXPLORATORY LAPAROTOMY W/ BOWEL RESECTION  07/25/2004   PROCEDURE:  Laparoscopy, open laparotomy, resection of jejunojejunostomy   INCISIONAL HERNIA REPAIR  03/19/2006   PROCEDURE:  Open ventral hernia repair with mesh.   LAPAROSCOPIC GASTRIC BYPASS  07/22/2004   PROCEDURE:  Laparoscopic Roux-en-Y gastric bypass, antecolic, antegastric,   LAPAROSCOPIC INCISIONAL / UMBILICAL / VENTRAL HERNIA REPAIR  09/22/2005   PROCEDURE:  Laparoscopic ventral hernia repair with mesh.   LEFT HEART CATH AND CORONARY ANGIOGRAPHY N/A 11/20/2016   Procedure: Left Heart Cath and Coronary Angiography;  Surgeon: Sherren Mocha, MD;  Location: [facility];  Service: Cardiovascular;  Laterality: N/A;   LEFT HEART CATHETERIZATION WITH CORONARY ANGIOGRAM N/A 08/21/2014   Procedure: LEFT HEART CATHETERIZATION WITH CORONARY ANGIOGRAM;  Surgeon: Sherren Mocha, MD;  Location: [facility];  Service: Cardiovascular;  Laterality: N/A;   stents     last time she said was 1 yr ago    Family History  Problem Relation Age of Onset   Stroke Mother    Stroke Father    Diabetes Father    Heart disease Father    Diabetes Son    Cancer Brother        BLADDER   Diabetes Brother    Social History   Socioeconomic History   Marital status: Widowed    Spouse name: Not on file   Number of children: Not on file   Years of education: Not on file   Highest education level: Not on file  Occupational History   Not on file  Tobacco Use   Smoking status: Never   Smokeless tobacco: Never  Vaping Use   Vaping Use: Never used  Substance and Sexual  Activity   Alcohol use: No   Drug use: No   Sexual activity: Not Currently  Other Topics Concern   Not on file  Social History Narrative   Not on file   Social Determinants of Health   Financial Resource Strain: Not on file  Food Insecurity: Not on file  Transportation Needs: Not on file  Physical Activity: Not on file  Stress: Not on file  Social Connections: Not on file    Tobacco Counseling Counseling given: Not Answered   Clinical Intake:                 Diabetic?yes         Activities of Daily Living No flowsheet data found.  Patient Care Team: Wardell Honour, MD as PCP - General (Family Medicine) Sherren Mocha, MD as PCP - Cardiology (Cardiology) Excell Seltzer, MD (Inactive) as Consulting Physician (General Surgery) Tanda Rockers, MD as Consulting Physician (Pulmonary Disease) Ronald Lobo, MD as Consulting Physician (Gastroenterology) Jacolyn Reedy, MD as Consulting Physician (Cardiology) Zadie Rhine Clent Demark, MD as Consulting Physician (Ophthalmology) Darleen Crocker, MD as Consulting Physician (Ophthalmology)  Indicate any recent Medical Services you may have received from other than [facility] providers in the past year (date may be approximate).     Assessment:   This is a routine wellness examination for Chayna.  Hearing/Vision screen Hearing Screening - Comments:: Patient wears hearing aids. Vision Screening - Comments:: Patient wears glasses. Patient has eye exam scheduled for this week. Patient sees Dr. Zadie Rhine.  Dietary issues and exercise activities discussed:     Goals Addressed   None    Depression Screen PHQ 2/9 Scores 12/03/2020 06/10/2020 06/10/2020 02/08/2020 11/21/2019 11/06/2019 04/24/2019  PHQ - 2 Score 0 0 0 0 0 0 0  PHQ- 9 Score - - - - - - -    Fall Risk Fall Risk  12/03/2020 10/09/2020 06/10/2020 06/10/2020 02/08/2020  Falls in the past year? 0 0 0 0 0  Number falls in past yr: 0 0 0 0 0  Injury with Fall? 0 0 0 0 0   Comment - - - - -  Risk for fall due to : No Fall Risks - - - History of fall(s);Impaired balance/gait;Impaired mobility  Follow up Falls evaluation completed - - - Falls evaluation completed;Education provided;Falls prevention discussed    FALL RISK PREVENTION PERTAINING TO THE HOME:  Any stairs in or around the home? Yes  If so, are there any without handrails? No  Home free of loose throw rugs in walkways, pet beds, electrical cords, etc? Yes  Adequate lighting in your home to reduce risk of falls? Yes   ASSISTIVE DEVICES UTILIZED TO PREVENT FALLS:  Life alert? Yes  Use of a cane, walker or w/c? Yes  Grab bars in the bathroom? Yes  Shower chair or bench in shower? Yes  Elevated toilet seat or a handicapped toilet? Yes   TIMED UP AND GO:  Was the test performed? No .   Cognitive Function: MMSE - Mini Mental State Exam 06/10/2020 11/09/2017 11/04/2016  Orientation to time 5 5 5   Orientation to Place 5 5 5   Registration 3 3 3   Attention/ Calculation 5 5 5   Recall 3 3 2   Language- name 2 objects 2 2 2   Language- repeat 1 1 1   Language- follow 3 step command 3 3 3   Language- read & follow direction 1 1 1   Write a sentence 1 1 1   Copy design 1 1 1   Total score 30 30 29      6CIT Screen 12/03/2020 11/21/2019 11/11/2018  What Year? 0 points 0 points 0 points  What month? 0 points 0 points 0 points  What time? 0 points 0 points 0 points  Count back from 20 4 points 0 points 0 points  Months in reverse 0 points 0 points 0 points  Repeat phrase 0 points 0 points 0 points  Total Score 4 0 0    Immunizations Immunization History  Administered Date(s) Administered   DTaP 11/12/2011   Fluad Quad(high Dose 65+) 02/09/2019, 02/08/2020   Influenza Split 02/13/2009, 02/28/2014   Influenza, High Dose Seasonal PF 03/17/2017, 02/10/2018   Influenza,inj,Quad PF,6+ Mos 05/01/2015, 03/04/2016   Influenza-Unspecified 02/22/2013   Pneumococcal Conjugate-13 06/07/2013   Pneumococcal  Polysaccharide-23 11/04/2016   Tdap 11/12/2011   Zoster Recombinat (Shingrix) 12/17/2017, 06/02/2018    TDAP status: Up to date  Flu Vaccine status: Up to date  Pneumococcal vaccine status: Up to date  Covid-19 vaccine status: Declined, Education has been provided regarding the importance of this vaccine but patient still declined. Advised may receive this vaccine at local pharmacy or Health Dept.or vaccine clinic. Aware to provide a copy of the vaccination record if obtained from local pharmacy or Health Dept. Verbalized acceptance and understanding.  Qualifies for Shingles Vaccine? Yes   Zostavax completed No   Shingrix Completed?: Yes  Screening Tests Health Maintenance  Topic Date Due   FOOT EXAM  11/05/2020   INFLUENZA VACCINE  12/30/2020   HEMOGLOBIN A1C  04/10/2021   OPHTHALMOLOGY EXAM  06/06/2021   TETANUS/TDAP  11/11/2021   DEXA SCAN  Completed   PNA vac Low Risk Adult  Completed   Zoster Vaccines- Shingrix  Completed   HPV VACCINES  Aged Out   COVID-19 Vaccine  Discontinued    Health Maintenance  Health Maintenance Due  Topic Date Due   FOOT EXAM  11/05/2020    Colorectal cancer screening: No longer required.   Mammogram status: No longer required due to age.  Bone Density status: Ordered today. Pt provided with contact info and advised to call to schedule appt.  Lung Cancer Screening: (Low Dose CT Chest recommended if Age 23-80 years, 30 pack-year currently smoking OR have quit w/in 15years.) does not qualify.   Lung Cancer Screening Referral: na  Additional Screening:  Hepatitis C Screening: does not qualify; Completed na  Vision Screening: Recommended annual ophthalmology exams for early detection of glaucoma and other disorders of the eye. Is the patient up to date with their annual eye exam?  Yes  Who is the provider or what is the name of the office in which the patient attends annual eye exams? Dr Zadie Rhine If pt is not established with a  provider, would they like to be referred to a provider to establish care? No .   Dental Screening: Recommended annual dental exams for proper oral hygiene  Community Resource Referral / Chronic Care Management: CRR required this visit?  No   CCM required this visit?  No      Plan:     I have personally reviewed and noted the following in the patient's chart:   Medical and social history Use of alcohol, tobacco or illicit drugs  Current medications and supplements including opioid prescriptions.  Functional ability and status Nutritional status Physical activity Advanced directives List of other physicians Hospitalizations, surgeries, and ER visits in previous 12 months Vitals Screenings to include cognitive, depression, and falls Referrals and appointments  In addition, I have reviewed and discussed with patient certain preventive protocols, quality metrics, and best practice recommendations. A written personalized care plan for preventive services as well as general preventive health recommendations were provided to patient.     Lauree Chandler, NP   12/03/2020    Virtual Visit via Telephone Note  I connected withNAME@ on 12/03/20 at  9:00 AM EDT by telephone and verified that I am speaking with the correct person using two identifiers.  Location: Patient: home Provider: [facility]   I discussed the limitations, risks, security and privacy concerns of performing an evaluation and management service by telephone and the availability of in person appointments. I also discussed with the patient that there may be a patient responsible charge related to this service. The patient expressed understanding and agreed to proceed.   I discussed the assessment and treatment plan with the patient. The patient was provided an opportunity to ask questions and all were answered. The patient agreed with the plan and demonstrated an understanding of the instructions.   The patient was  advised to call back or seek an in-person evaluation if the symptoms worsen or if the condition fails to improve as anticipated.  I provided 16 minutes of non-face-to-face time during this encounter.  Carlos American. Harle Battiest Avs printed and mailed

## 2020-12-05 ENCOUNTER — Ambulatory Visit (INDEPENDENT_AMBULATORY_CARE_PROVIDER_SITE_OTHER): Payer: Medicare Other | Admitting: Ophthalmology

## 2020-12-05 ENCOUNTER — Other Ambulatory Visit: Payer: Self-pay

## 2020-12-05 DIAGNOSIS — E113491 Type 2 diabetes mellitus with severe nonproliferative diabetic retinopathy without macular edema, right eye: Secondary | ICD-10-CM

## 2020-12-05 DIAGNOSIS — E113392 Type 2 diabetes mellitus with moderate nonproliferative diabetic retinopathy without macular edema, left eye: Secondary | ICD-10-CM

## 2020-12-05 DIAGNOSIS — H35372 Puckering of macula, left eye: Secondary | ICD-10-CM | POA: Diagnosis not present

## 2020-12-05 NOTE — Assessment & Plan Note (Signed)
Moderate nonproliferative diabetic retinopathy left eye persists stable

## 2020-12-05 NOTE — Progress Notes (Signed)
12/05/2020     CHIEF COMPLAINT Patient presents for Retina Evaluation (Follow-up OU for epiretinal membrane OS)   HISTORY OF PRESENT ILLNESS: [name] is a 80 y.o. female who presents to the clinic today for:   HPI     Retina Evaluation           Laterality: both eyes   MD Performed: performed the HPI with the patient and updated documentation appropriately   Comments: Follow-up OU for epiretinal membrane OS       Last edited by Hurman Horn, MD on 12/05/2020 10:53 AM.      Referring physician: Gayland Curry, DO [place],  [place] [address]  HISTORICAL INFORMATION:   Selected notes from the MEDICAL RECORD NUMBER    Lab Results  Component Value Date   HGBA1C 6.4 (H) 10/08/2020     CURRENT MEDICATIONS: Current Outpatient Medications (Ophthalmic Drugs)  Medication Sig   latanoprost (XALATAN) 0.005 % ophthalmic solution Place 1 drop into both eyes at bedtime.   No current facility-administered medications for this visit. (Ophthalmic Drugs)   Current Outpatient Medications (Other)  Medication Sig   amLODipine (NORVASC) 2.5 MG tablet TAKE 1 TABLET BY MOUTH EVERY DAY   aspirin 81 MG tablet Take 81 mg by mouth daily.   atorvastatin (LIPITOR) 20 MG tablet TAKE 1 TABLET BY MOUTH EVERY DAY   Calcium Citrate-Vitamin D (CALCIUM CITRATE + D PO) Take by mouth daily.   cetirizine (ZYRTEC) 10 MG tablet Take 10 mg by mouth daily as needed for allergies. For allergies   Cholecalciferol (VITAMIN D3) 5000 UNITS CAPS Take 1 tablet by mouth daily. For supplement   CINNAMON PO Take 1,000 mg by mouth daily.   citalopram (CELEXA) 40 MG tablet TAKE 1 TABLET BY MOUTH EVERY DAY FOR DEPRESSION   Continuous Blood Gluc Receiver (FREESTYLE LIBRE 2 READER) DEVI Use to test blood sugar three times daily.  Dx: E11.9   Continuous Blood Gluc Sensor (FREESTYLE LIBRE 2 SENSOR) MISC Use to test blood sugar three times daily. Dx: E11.9   docusate sodium (COLACE) 100 MG capsule Take  100 mg by mouth 2 (two) times daily.   donepezil (ARICEPT) 5 MG tablet Take 1 tablet (5 mg total) by mouth at bedtime.   ferrous sulfate 325 (65 FE) MG tablet Take 325 mg by mouth 2 (two) times daily with a meal.   furosemide (LASIX) 20 MG tablet Take 20 mg by mouth.   glipiZIDE (GLUCOTROL XL) 10 MG 24 hr tablet TAKE 1 TABLET BY MOUTH EVERY DAY IN THE MORNING AND 1 TABLET IN EVENING FOR SUGAR CONTROL   insulin NPH Human (HUMULIN N) 100 UNIT/ML injection INJECT 40 UNITS IN THE MORNING AND 40 UNITS IN THE EVENING   Insulin Syringes, Disposable, U-100 1 ML MISC Use to inject insulin Dx: B70.4888   isosorbide mononitrate (IMDUR) 30 MG 24 hr tablet TAKE 1 TABLET BY MOUTH EVERY DAY   losartan-hydrochlorothiazide (HYZAAR) 50-12.5 MG tablet TAKE 1 TABLET BY MOUTH EVERY DAY FOR BLOOD PRESSURE   Magnesium Oxide 250 MG TABS Take 500 mg by mouth daily.   meclizine (ANTIVERT) 25 MG tablet Take 1 tablet (25 mg total) by mouth 3 (three) times daily as needed for dizziness. For dizziness   nitroGLYCERIN (NITROSTAT) 0.4 MG SL tablet Place 0.4 mg under the tongue every 5 (five) minutes as needed for chest pain.   omeprazole (PRILOSEC) 20 MG capsule TAKE 1 CAPSULE BY MOUTH EVERY DAY  predniSONE (DELTASONE) 5 MG tablet TAKE 1 TABLET BY MOUTH EVERY DAY IN THE MORNING TO REDUCE PAIN   vitamin C (ASCORBIC ACID) 500 MG tablet Take 500 mg by mouth daily.   No current facility-administered medications for this visit. (Other)      REVIEW OF SYSTEMS:    ALLERGIES Allergies  Allergen Reactions   Ciprofloxacin Rash   Advair Diskus [Fluticasone-Salmeterol] Other (See Comments)    Throat closes   Albuterol     REACTION: closes throat   Alendronate Sodium Itching   Benadryl [Diphenhydramine Hcl] Itching   Benzonatate     REACTION: rash/hives   Cephalexin Nausea Only   Gabapentin Other (See Comments)    Loss of memory   Iohexol      Desc: rash and DIF BREATHING    Keflex [Cephalexin] Nausea And Vomiting    Morphine Nausea And Vomiting   Other Other (See Comments)    Decongestants- keeps pt awake at night, increased heart rate   Propoxyphene Hcl     Doesn't recall   Reclast [Zoledronic Acid] Other (See Comments)    Chest pain   Avapro [Irbesartan] Rash   Codeine Nausea And Vomiting, Swelling and Rash   Tessalon Perles Rash   Tramadol Nausea Only and Rash    PAST MEDICAL HISTORY Past Medical History:  Diagnosis Date   Abdominal pain, unspecified site    Abnormality of gait    Apnea    Cardiomegaly    Chest pain, unspecified    Complication of anesthesia    hard time waking up   Depressive disorder, not elsewhere classified    Diabetes mellitus    Diabetic retinopathy (HCC)    Dizziness and giddiness    Dyskinesia of esophagus    Edema    Extrinsic asthma, unspecified    Female stress incontinence    Gout, unspecified    High cholesterol    Hypertension    Lipoma of other skin and subcutaneous tissue    Lumbago    Memory loss    Migraine without aura, with intractable migraine, so stated, with status migrainosus    Mild cognitive impairment, so stated    Nonspecific (abnormal) findings on radiological and other examination of abdominal area, including retroperitoneum    Nonspecific abnormal results of liver function study    Obesity, unspecified    Obstructive chronic bronchitis with exacerbation (HCC)    Other and unspecified hyperlipidemia    Other B-complex deficiencies    Other nonspecific abnormal serum enzyme levels    Other specified cardiac dysrhythmias(427.89)    Other specified disease of sebaceous glands    Other symptoms involving cardiovascular system    Pain in joint, ankle and foot    Pain in joint, pelvic region and thigh    Pain in joint, shoulder region    Palpitations    Reflux esophagitis    Shortness of breath    Tension headache    Type I (juvenile type) diabetes mellitus without mention of complication, not stated as uncontrolled    Type I  (juvenile type) diabetes mellitus without mention of complication, uncontrolled    Unspecified essential hypertension    Unspecified hypothyroidism    Unspecified vitamin D deficiency    Ventral hernia, unspecified, without mention of obstruction or gangrene    Past Surgical History:  Procedure Laterality Date   ABDOMINAL HYSTERECTOMY     APPENDECTOMY     BREAST SURGERY     EXPLORATORY LAPAROTOMY W/ BOWEL RESECTION  07/25/2004   PROCEDURE:  Laparoscopy, open laparotomy, resection of jejunojejunostomy   INCISIONAL HERNIA REPAIR  03/19/2006   PROCEDURE:  Open ventral hernia repair with mesh.   LAPAROSCOPIC GASTRIC BYPASS  07/22/2004   PROCEDURE:  Laparoscopic Roux-en-Y gastric bypass, antecolic, antegastric,   LAPAROSCOPIC INCISIONAL / UMBILICAL / VENTRAL HERNIA REPAIR  09/22/2005   PROCEDURE:  Laparoscopic ventral hernia repair with mesh.   LEFT HEART CATH AND CORONARY ANGIOGRAPHY N/A 11/20/2016   Procedure: Left Heart Cath and Coronary Angiography;  Surgeon: Sherren Mocha, MD;  Location: [facility];  Service: Cardiovascular;  Laterality: N/A;   LEFT HEART CATHETERIZATION WITH CORONARY ANGIOGRAM N/A 08/21/2014   Procedure: LEFT HEART CATHETERIZATION WITH CORONARY ANGIOGRAM;  Surgeon: Sherren Mocha, MD;  Location: [facility];  Service: Cardiovascular;  Laterality: N/A;   stents     last time she said was 1 yr ago     FAMILY HISTORY Family History  Problem Relation Age of Onset   Stroke Mother    Stroke Father    Diabetes Father    Heart disease Father    Diabetes Son    Cancer Brother        BLADDER   Diabetes Brother     SOCIAL HISTORY Social History   Tobacco Use   Smoking status: Never   Smokeless tobacco: Never  Vaping Use   Vaping Use: Never used  Substance Use Topics   Alcohol use: No   Drug use: No         OPHTHALMIC EXAM:  Base Eye Exam     Visual Acuity (ETDRS)       Right Left   Dist cc 20/25 20/25   Dist ph cc NI NI          Tonometry (Tonopen, 11:15 AM)       Right Left   Pressure 17 17         Pupils       Pupils React APD   Right PERRL Brisk None   Left PERRL Brisk None         Visual Fields       Left Right    Full Full         Extraocular Movement       Right Left    Full Full         Neuro/Psych     Oriented x3: Yes   Mood/Affect: Normal         Dilation     Both eyes: 1.0% Mydriacyl, 2.5% Phenylephrine @ 10:54 AM           Slit Lamp and Fundus Exam     External Exam       Right Left   External Normal Normal         Slit Lamp Exam       Right Left   Lids/Lashes Normal Normal   Conjunctiva/Sclera White and quiet White and quiet   Cornea Clear Clear   Anterior Chamber Deep and quiet Deep and quiet   Iris Round and reactive Round and reactive   Lens Posterior chamber intraocular lens, Centered posterior chamber intraocular lens Posterior chamber intraocular lens, Centered posterior chamber intraocular lens   Anterior Vitreous Normal Normal         Fundus Exam       Right Left   Posterior Vitreous Normal Normal   Disc Normal Normal   C/D Ratio 0.15 0.2   Macula Normal, microaneurysms temporal, no  CSME Epiretinal membrane, nasal FAZ   Vessels NPDR-Severe NPDR- Moderate   Periphery Normal Normal            IMAGING AND PROCEDURES  Imaging and Procedures for 12/05/20  OCT, Retina - OU - Both Eyes       Right Eye Quality was good. Scan locations included subfoveal. Central Foveal Thickness: 224. Progression has been stable. Findings include abnormal foveal contour.   Left Eye Quality was good. Scan locations included subfoveal. Central Foveal Thickness: 242. Progression has been stable. Findings include abnormal foveal contour, epiretinal membrane.   Notes Micro-CME temporal, not center involved will observe  Epiretinal membrane, nasal to FAZ, not center involved will observe no progression to involve the fovea at this time no active  CSME             ASSESSMENT/PLAN:  Moderate nonproliferative diabetic retinopathy of left eye (HCC) Moderate nonproliferative diabetic retinopathy left eye persists stable  Non-proliferative diabetic retinopathy, severe, right eye (HCC) Moderate to severe nonproliferative diabetic retinopathy remained stable  Macular pucker, left eye Anatomically mostly nasal to the fovea, no impact on acuity will observe     ICD-10-CM   1. Macular pucker, left eye  H35.372 OCT, Retina - OU - Both Eyes    2. Severe nonproliferative diabetic retinopathy of right eye without macular edema associated with type 2 diabetes mellitus (HCC)  E11.3491 OCT, Retina - OU - Both Eyes    3. Moderate nonproliferative diabetic retinopathy of left eye without macular edema associated with type 2 diabetes mellitus (HCC)  A07.6226 OCT, Retina - OU - Both Eyes      1.  Stable over time, no progression of retinopathy despite patient's "brittle diabetes management".  2.  New epiretinal membrane left eye with no visual acuity impact we will continue to observe  3.  Ophthalmic Meds Ordered this visit:  No orders of the defined types were placed in this encounter.      Return in about 6 months (around 06/07/2021) for DILATE OU, COLOR FP, OCT.  There are no Patient Instructions on file for this visit.   Explained the diagnoses, plan, and follow up with the patient and they expressed understanding.  Patient expressed understanding of the importance of proper follow up care.   Clent Demark Harshith Pursell M.D. Diseases & Surgery of the Retina and Vitreous Retina & Diabetic [place] 12/05/20     Abbreviations: M myopia (nearsighted); A astigmatism; H hyperopia (farsighted); P presbyopia; Mrx spectacle prescription;  CTL contact lenses; OD right eye; OS left eye; OU both eyes  XT exotropia; ET esotropia; PEK punctate epithelial keratitis; PEE punctate epithelial erosions; DES dry eye syndrome; MGD meibomian gland  dysfunction; ATs artificial tears; PFAT's preservative free artificial tears; [place] nuclear sclerotic cataract; PSC posterior subcapsular cataract; ERM epi-retinal membrane; PVD posterior vitreous detachment; RD retinal detachment; DM diabetes mellitus; DR diabetic retinopathy; NPDR non-proliferative diabetic retinopathy; PDR proliferative diabetic retinopathy; CSME clinically significant macular edema; DME diabetic macular edema; dbh dot blot hemorrhages; CWS cotton wool spot; POAG primary open angle glaucoma; C/D cup-to-disc ratio; HVF humphrey visual field; GVF goldmann visual field; OCT optical coherence tomography; IOP intraocular pressure; BRVO Branch retinal vein occlusion; CRVO central retinal vein occlusion; CRAO central retinal artery occlusion; BRAO branch retinal artery occlusion; RT retinal tear; SB scleral buckle; PPV pars plana vitrectomy; VH Vitreous hemorrhage; PRP panretinal laser photocoagulation; IVK intravitreal kenalog; VMT vitreomacular traction; MH Macular hole;  NVD neovascularization of the disc; NVE neovascularization elsewhere; AREDS age  related eye disease study; ARMD age related macular degeneration; POAG primary open angle glaucoma; EBMD epithelial/anterior basement membrane dystrophy; ACIOL anterior chamber intraocular lens; IOL intraocular lens; PCIOL posterior chamber intraocular lens; Phaco/IOL phacoemulsification with intraocular lens placement; [place] photorefractive keratectomy; LASIK laser assisted in situ keratomileusis; HTN hypertension; DM diabetes mellitus; COPD chronic obstructive pulmonary disease

## 2020-12-05 NOTE — Assessment & Plan Note (Signed)
Anatomically mostly nasal to the fovea, no impact on acuity will observe

## 2020-12-05 NOTE — Assessment & Plan Note (Signed)
Moderate to severe nonproliferative diabetic retinopathy remained stable

## 2020-12-09 ENCOUNTER — Other Ambulatory Visit: Payer: Self-pay | Admitting: Family Medicine

## 2020-12-09 DIAGNOSIS — K219 Gastro-esophageal reflux disease without esophagitis: Secondary | ICD-10-CM

## 2020-12-10 ENCOUNTER — Other Ambulatory Visit: Payer: Self-pay | Admitting: *Deleted

## 2020-12-10 MED ORDER — GLIPIZIDE ER 10 MG PO TB24: ORAL_TABLET | ORAL | 1 refills | Status: DC

## 2020-12-10 MED ORDER — DONEPEZIL HCL 5 MG PO TABS
5.0000 mg | ORAL_TABLET | Freq: Every day | ORAL | 1 refills | Status: DC
Start: 2020-12-10 — End: 2021-06-23

## 2020-12-10 NOTE — Addendum Note (Signed)
Addended by: Rafael Bihari A on: 12/10/2020 10:14 AM   Modules accepted: Orders

## 2020-12-10 NOTE — Telephone Encounter (Signed)
[facility] requested refill.

## 2020-12-22 ENCOUNTER — Other Ambulatory Visit: Payer: Self-pay | Admitting: Family Medicine

## 2020-12-22 ENCOUNTER — Other Ambulatory Visit: Payer: Self-pay | Admitting: Nurse Practitioner

## 2020-12-22 DIAGNOSIS — G8929 Other chronic pain: Secondary | ICD-10-CM

## 2020-12-22 DIAGNOSIS — K219 Gastro-esophageal reflux disease without esophagitis: Secondary | ICD-10-CM

## 2020-12-23 NOTE — Telephone Encounter (Signed)
Left message on voicemail for patient to return call when available.  Reason for call: Clarify why patient requested refill on prednisone as that medication is usually temporary   Awaiting return call

## 2020-12-23 NOTE — Telephone Encounter (Signed)
Spoke with patient, patient states this is a permanent rx that she takes long term, patient is requesting a 90 day supply with additional refills  Patient aware I will send to in office provider for approval

## 2021-01-02 ENCOUNTER — Encounter (HOSPITAL_COMMUNITY): Payer: Self-pay

## 2021-01-02 ENCOUNTER — Emergency Department (HOSPITAL_COMMUNITY): Payer: Medicare Other

## 2021-01-02 ENCOUNTER — Inpatient Hospital Stay (HOSPITAL_COMMUNITY)
Admission: EM | Admit: 2021-01-02 | Discharge: 2021-01-10 | DRG: 390 | Disposition: A | Discharge status: Home / Self Care | Payer: Medicare Other | Attending: Internal Medicine | Admitting: Internal Medicine

## 2021-01-02 DIAGNOSIS — Z823 Family history of stroke: Secondary | ICD-10-CM

## 2021-01-02 DIAGNOSIS — Z794 Long term (current) use of insulin: Secondary | ICD-10-CM | POA: Diagnosis not present

## 2021-01-02 DIAGNOSIS — F419 Anxiety disorder, unspecified: Secondary | ICD-10-CM | POA: Diagnosis present

## 2021-01-02 DIAGNOSIS — Z885 Allergy status to narcotic agent status: Secondary | ICD-10-CM | POA: Diagnosis not present

## 2021-01-02 DIAGNOSIS — L02211 Cutaneous abscess of abdominal wall: Secondary | ICD-10-CM | POA: Diagnosis not present

## 2021-01-02 DIAGNOSIS — E7800 Pure hypercholesterolemia, unspecified: Secondary | ICD-10-CM | POA: Diagnosis not present

## 2021-01-02 DIAGNOSIS — E1021 Type 1 diabetes mellitus with diabetic nephropathy: Secondary | ICD-10-CM | POA: Diagnosis present

## 2021-01-02 DIAGNOSIS — K219 Gastro-esophageal reflux disease without esophagitis: Secondary | ICD-10-CM | POA: Diagnosis not present

## 2021-01-02 DIAGNOSIS — R42 Dizziness and giddiness: Secondary | ICD-10-CM | POA: Diagnosis not present

## 2021-01-02 DIAGNOSIS — Z7952 Long term (current) use of systemic steroids: Secondary | ICD-10-CM | POA: Diagnosis not present

## 2021-01-02 DIAGNOSIS — K432 Incisional hernia without obstruction or gangrene: Secondary | ICD-10-CM | POA: Diagnosis not present

## 2021-01-02 DIAGNOSIS — E669 Obesity, unspecified: Secondary | ICD-10-CM | POA: Diagnosis present

## 2021-01-02 DIAGNOSIS — Z9884 Bariatric surgery status: Secondary | ICD-10-CM

## 2021-01-02 DIAGNOSIS — K5651 Intestinal adhesions [bands], with partial obstruction: Secondary | ICD-10-CM | POA: Diagnosis not present

## 2021-01-02 DIAGNOSIS — K56609 Unspecified intestinal obstruction, unspecified as to partial versus complete obstruction: Secondary | ICD-10-CM | POA: Diagnosis present

## 2021-01-02 DIAGNOSIS — E1121 Type 2 diabetes mellitus with diabetic nephropathy: Secondary | ICD-10-CM | POA: Diagnosis not present

## 2021-01-02 DIAGNOSIS — K8020 Calculus of gallbladder without cholecystitis without obstruction: Secondary | ICD-10-CM | POA: Diagnosis present

## 2021-01-02 DIAGNOSIS — R0603 Acute respiratory distress: Secondary | ICD-10-CM | POA: Diagnosis not present

## 2021-01-02 DIAGNOSIS — E1065 Type 1 diabetes mellitus with hyperglycemia: Secondary | ICD-10-CM | POA: Diagnosis present

## 2021-01-02 DIAGNOSIS — I10 Essential (primary) hypertension: Secondary | ICD-10-CM | POA: Diagnosis present

## 2021-01-02 DIAGNOSIS — F32A Depression, unspecified: Secondary | ICD-10-CM | POA: Diagnosis present

## 2021-01-02 DIAGNOSIS — K5939 Other megacolon: Secondary | ICD-10-CM | POA: Diagnosis not present

## 2021-01-02 DIAGNOSIS — Z66 Do not resuscitate: Secondary | ICD-10-CM | POA: Diagnosis present

## 2021-01-02 DIAGNOSIS — K6389 Other specified diseases of intestine: Secondary | ICD-10-CM | POA: Diagnosis not present

## 2021-01-02 DIAGNOSIS — Z20822 Contact with and (suspected) exposure to covid-19: Secondary | ICD-10-CM | POA: Diagnosis present

## 2021-01-02 DIAGNOSIS — Z79899 Other long term (current) drug therapy: Secondary | ICD-10-CM | POA: Diagnosis not present

## 2021-01-02 DIAGNOSIS — K439 Ventral hernia without obstruction or gangrene: Secondary | ICD-10-CM | POA: Diagnosis not present

## 2021-01-02 DIAGNOSIS — E10649 Type 1 diabetes mellitus with hypoglycemia without coma: Secondary | ICD-10-CM | POA: Diagnosis present

## 2021-01-02 DIAGNOSIS — E039 Hypothyroidism, unspecified: Secondary | ICD-10-CM | POA: Diagnosis present

## 2021-01-02 DIAGNOSIS — H532 Diplopia: Secondary | ICD-10-CM | POA: Diagnosis not present

## 2021-01-02 DIAGNOSIS — Z6836 Body mass index (BMI) 36.0-36.9, adult: Secondary | ICD-10-CM | POA: Diagnosis not present

## 2021-01-02 DIAGNOSIS — Z833 Family history of diabetes mellitus: Secondary | ICD-10-CM

## 2021-01-02 DIAGNOSIS — Z7982 Long term (current) use of aspirin: Secondary | ICD-10-CM | POA: Diagnosis not present

## 2021-01-02 DIAGNOSIS — K5669 Other partial intestinal obstruction: Secondary | ICD-10-CM | POA: Diagnosis not present

## 2021-01-02 DIAGNOSIS — J383 Other diseases of vocal cords: Secondary | ICD-10-CM | POA: Diagnosis not present

## 2021-01-02 DIAGNOSIS — Z888 Allergy status to other drugs, medicaments and biological substances status: Secondary | ICD-10-CM

## 2021-01-02 DIAGNOSIS — K5900 Constipation, unspecified: Secondary | ICD-10-CM | POA: Diagnosis not present

## 2021-01-02 DIAGNOSIS — Z8249 Family history of ischemic heart disease and other diseases of the circulatory system: Secondary | ICD-10-CM

## 2021-01-02 DIAGNOSIS — G319 Degenerative disease of nervous system, unspecified: Secondary | ICD-10-CM | POA: Diagnosis not present

## 2021-01-02 LAB — CBC WITH DIFFERENTIAL/PLATELET
Abs Immature Granulocytes: 0.03 10*3/uL (ref 0.00–0.07)
Basophils Absolute: 0.1 10*3/uL (ref 0.0–0.1)
Basophils Relative: 1 %
Eosinophils Absolute: 0.1 10*3/uL (ref 0.0–0.5)
Eosinophils Relative: 0 %
HCT: 43.1 % (ref 36.0–46.0)
Hemoglobin: 13.4 g/dL (ref 12.0–15.0)
Immature Granulocytes: 0 %
Lymphocytes Relative: 11 %
Lymphs Abs: 1.5 10*3/uL (ref 0.7–4.0)
MCH: 29.4 pg (ref 26.0–34.0)
MCHC: 31.1 g/dL (ref 30.0–36.0)
MCV: 94.5 fL (ref 80.0–100.0)
Monocytes Absolute: 0.6 10*3/uL (ref 0.1–1.0)
Monocytes Relative: 5 %
Neutro Abs: 11.1 10*3/uL — ABNORMAL HIGH (ref 1.7–7.7)
Neutrophils Relative %: 83 %
Platelets: 333 10*3/uL (ref 150–400)
RBC: 4.56 MIL/uL (ref 3.87–5.11)
RDW: 13.8 % (ref 11.5–15.5)
WBC: 13.5 10*3/uL — ABNORMAL HIGH (ref 4.0–10.5)
nRBC: 0 % (ref 0.0–0.2)

## 2021-01-02 LAB — I-STAT CHEM 8, ED
BUN: 15 mg/dL (ref 8–23)
Calcium, Ion: 1.22 mmol/L (ref 1.15–1.40)
Chloride: 103 mmol/L (ref 98–111)
Creatinine, Ser: 1 mg/dL (ref 0.44–1.00)
Glucose, Bld: 107 mg/dL — ABNORMAL HIGH (ref 70–99)
HCT: 41 % (ref 36.0–46.0)
Hemoglobin: 13.9 g/dL (ref 12.0–15.0)
Potassium: 3.8 mmol/L (ref 3.5–5.1)
Sodium: 141 mmol/L (ref 135–145)
TCO2: 29 mmol/L (ref 22–32)

## 2021-01-02 LAB — CBG MONITORING, ED
Glucose-Capillary: 107 mg/dL — ABNORMAL HIGH (ref 70–99)
Glucose-Capillary: 108 mg/dL — ABNORMAL HIGH (ref 70–99)
Glucose-Capillary: 197 mg/dL — ABNORMAL HIGH (ref 70–99)

## 2021-01-02 LAB — URINALYSIS, ROUTINE W REFLEX MICROSCOPIC
Bacteria, UA: NONE SEEN
Bilirubin Urine: NEGATIVE
Glucose, UA: NEGATIVE mg/dL
Hgb urine dipstick: NEGATIVE
Ketones, ur: NEGATIVE mg/dL
Nitrite: NEGATIVE
Protein, ur: 30 mg/dL — AB
Specific Gravity, Urine: 1.015 (ref 1.005–1.030)
pH: 5 (ref 5.0–8.0)

## 2021-01-02 LAB — COMPREHENSIVE METABOLIC PANEL
ALT: 19 U/L (ref 0–44)
AST: 19 U/L (ref 15–41)
Albumin: 3.4 g/dL — ABNORMAL LOW (ref 3.5–5.0)
Alkaline Phosphatase: 76 U/L (ref 38–126)
Anion gap: 7 (ref 5–15)
BUN: 14 mg/dL (ref 8–23)
CO2: 29 mmol/L (ref 22–32)
Calcium: 9.4 mg/dL (ref 8.9–10.3)
Chloride: 105 mmol/L (ref 98–111)
Creatinine, Ser: 0.96 mg/dL (ref 0.44–1.00)
GFR, Estimated: 59 mL/min — ABNORMAL LOW (ref >60)
Glucose, Bld: 105 mg/dL — ABNORMAL HIGH (ref 70–99)
Potassium: 3.9 mmol/L (ref 3.5–5.1)
Sodium: 141 mmol/L (ref 135–145)
Total Bilirubin: 1 mg/dL (ref 0.3–1.2)
Total Protein: 6.3 g/dL — ABNORMAL LOW (ref 6.5–8.1)

## 2021-01-02 LAB — LIPASE, BLOOD: Lipase: 27 U/L (ref 11–51)

## 2021-01-02 MED ORDER — INSULIN ASPART 100 UNIT/ML IJ SOLN
0.0000 [IU] | INTRAMUSCULAR | Status: DC
  Administered 2021-01-03: 2 [IU] via SUBCUTANEOUS

## 2021-01-02 MED ORDER — PREDNISONE 5 MG PO TABS
5.0000 mg | ORAL_TABLET | Freq: Every day | ORAL | Status: DC
  Administered 2021-01-03 – 2021-01-10 (×8): 5 mg via ORAL
  Filled 2021-01-02 (×8): qty 1

## 2021-01-02 MED ORDER — LACTATED RINGERS IV SOLN: INTRAVENOUS | Status: DC

## 2021-01-02 MED ORDER — INSULIN NPH (HUMAN) (ISOPHANE) 100 UNIT/ML ~~LOC~~ SUSP: 20.0000 [IU] | Freq: Two times a day (BID) | SUBCUTANEOUS | Status: DC

## 2021-01-02 MED ORDER — AMLODIPINE BESYLATE 2.5 MG PO TABS
2.5000 mg | ORAL_TABLET | Freq: Every day | ORAL | Status: DC
  Administered 2021-01-03 – 2021-01-10 (×8): 2.5 mg via ORAL
  Filled 2021-01-02 (×8): qty 1

## 2021-01-02 MED ORDER — ENOXAPARIN SODIUM 40 MG/0.4ML IJ SOSY
40.0000 mg | PREFILLED_SYRINGE | INTRAMUSCULAR | Status: DC
  Administered 2021-01-03 – 2021-01-10 (×8): 40 mg via SUBCUTANEOUS
  Filled 2021-01-02 (×8): qty 0.4

## 2021-01-02 MED ORDER — PANTOPRAZOLE SODIUM 40 MG PO TBEC
40.0000 mg | DELAYED_RELEASE_TABLET | Freq: Every day | ORAL | Status: DC
  Administered 2021-01-03: 40 mg via ORAL
  Filled 2021-01-02: qty 1

## 2021-01-02 MED ORDER — INSULIN NPH (HUMAN) (ISOPHANE) 100 UNIT/ML ~~LOC~~ SUSP: 15.0000 [IU] | Freq: Two times a day (BID) | SUBCUTANEOUS | Status: DC

## 2021-01-02 MED ORDER — CITALOPRAM HYDROBROMIDE 40 MG PO TABS
40.0000 mg | ORAL_TABLET | Freq: Every day | ORAL | Status: DC
  Administered 2021-01-03 – 2021-01-10 (×8): 40 mg via ORAL
  Filled 2021-01-02 (×6): qty 1
  Filled 2021-01-02: qty 4
  Filled 2021-01-02: qty 1

## 2021-01-02 MED ORDER — ONDANSETRON HCL 4 MG/2ML IJ SOLN
4.0000 mg | Freq: Four times a day (QID) | INTRAMUSCULAR | Status: DC | PRN
  Administered 2021-01-07 – 2021-01-09 (×3): 4 mg via INTRAVENOUS
  Filled 2021-01-02 (×4): qty 2

## 2021-01-02 MED ORDER — ATORVASTATIN CALCIUM 10 MG PO TABS: 20.0000 mg | ORAL_TABLET | Freq: Every day | ORAL | Status: DC

## 2021-01-02 MED ORDER — DIATRIZOATE MEGLUMINE & SODIUM 66-10 % PO SOLN
90.0000 mL | Freq: Once | ORAL | Status: AC
  Administered 2021-01-02: 90 mL via NASOGASTRIC
  Filled 2021-01-02: qty 90

## 2021-01-02 MED ORDER — LATANOPROST 0.005 % OP SOLN: 1.0000 [drp] | Freq: Every day | OPHTHALMIC | Status: DC

## 2021-01-02 MED ORDER — LOSARTAN POTASSIUM 50 MG PO TABS
50.0000 mg | ORAL_TABLET | Freq: Every day | ORAL | Status: DC
  Administered 2021-01-03 – 2021-01-10 (×8): 50 mg via ORAL
  Filled 2021-01-02 (×9): qty 1

## 2021-01-02 MED ORDER — ISOSORBIDE MONONITRATE ER 30 MG PO TB24
30.0000 mg | ORAL_TABLET | Freq: Every day | ORAL | Status: DC
  Administered 2021-01-03 – 2021-01-10 (×8): 30 mg via ORAL
  Filled 2021-01-02 (×8): qty 1

## 2021-01-02 MED ORDER — ONDANSETRON 4 MG PO TBDP: 4.0000 mg | ORAL_TABLET | Freq: Once | ORAL | Status: DC

## 2021-01-02 MED ORDER — ACETAMINOPHEN 650 MG RE SUPP: 650.0000 mg | Freq: Four times a day (QID) | RECTAL | Status: DC | PRN

## 2021-01-02 MED ORDER — ONDANSETRON HCL 4 MG PO TABS
4.0000 mg | ORAL_TABLET | Freq: Four times a day (QID) | ORAL | Status: DC | PRN
  Administered 2021-01-06 (×2): 4 mg via ORAL
  Filled 2021-01-02 (×2): qty 1

## 2021-01-02 MED ORDER — ACETAMINOPHEN 325 MG PO TABS: 650.0000 mg | ORAL_TABLET | Freq: Four times a day (QID) | ORAL | Status: DC | PRN

## 2021-01-02 MED ORDER — ASPIRIN 81 MG PO CHEW: 81.0000 mg | CHEWABLE_TABLET | Freq: Every day | ORAL | Status: DC

## 2021-01-02 NOTE — ED Notes (Signed)
Gave pt OJ with sugar. Pt states she is not nauseated at this moment, would prefer to wait to take zofran.

## 2021-01-02 NOTE — ED Provider Notes (Signed)
[facility] EMERGENCY DEPARTMENT Provider Note   CSN: [id number] Arrival date & time: 01/02/21  1712     History Chief Complaint  Patient presents with   Hypoglycemia   Constipation    [name] is a 80 y.o. female with a past medical history of hypertension, type 1 diabetes, history of previous abdominal surgeries is presenting for 7 days of constipation.  Patient states that she normally has bowel movement daily.  She has tried taking Dulcolax without relief of symptoms.  She is not passing gas.  Patient has complained of nausea.  She has had one episode of vomiting.  Patient denies any dysuria.  Patient denies any blood in her stool or urine.  She denies any fevers, chills, headache, neck stiffness, chest pain, shortness of breath, vomiting, diarrhea, numbness or weakness.   Constipation Time since last bowel movement:  7 days Timing:  Constant Stool description:  None produced Unusual stool frequency:  No bowel movements in the last seven days Relieved by:  Nothing Ineffective treatments:  Laxatives Associated symptoms: abdominal pain and nausea   Associated symptoms: no anorexia, no back pain, no diarrhea, no dysuria, no fever, no flatus, no hematochezia, no urinary retention and no vomiting   Risk factors: hx of abdominal surgery       Past Medical History:  Diagnosis Date   Abdominal pain, unspecified site    Abnormality of gait    Apnea    Cardiomegaly    Chest pain, unspecified    Complication of anesthesia    hard time waking up   Depressive disorder, not elsewhere classified    Diabetes mellitus    Diabetic retinopathy (HCC)    Dizziness and giddiness    Dyskinesia of esophagus    Edema    Extrinsic asthma, unspecified    Female stress incontinence    Gout, unspecified    High cholesterol    Hypertension    Lipoma of other skin and subcutaneous tissue    Lumbago    Memory loss    Migraine without aura, with intractable migraine,  so stated, with status migrainosus    Mild cognitive impairment, so stated    Nonspecific (abnormal) findings on radiological and other examination of abdominal area, including retroperitoneum    Nonspecific abnormal results of liver function study    Obesity, unspecified    Obstructive chronic bronchitis with exacerbation (HCC)    Other and unspecified hyperlipidemia    Other B-complex deficiencies    Other nonspecific abnormal serum enzyme levels    Other specified cardiac dysrhythmias(427.89)    Other specified disease of sebaceous glands    Other symptoms involving cardiovascular system    Pain in joint, ankle and foot    Pain in joint, pelvic region and thigh    Pain in joint, shoulder region    Palpitations    Reflux esophagitis    Shortness of breath    Tension headache    Type I (juvenile type) diabetes mellitus without mention of complication, not stated as uncontrolled    Type I (juvenile type) diabetes mellitus without mention of complication, uncontrolled    Unspecified essential hypertension    Unspecified hypothyroidism    Unspecified vitamin D deficiency    Ventral hernia, unspecified, without mention of obstruction or gangrene     Patient Active Problem List   Diagnosis Date Noted   SBO (small bowel obstruction) ([place]) 01/02/2021   Current chronic use of systemic steroids 01/02/2021  Venous insufficiency 06/10/2020   High risk social situation 06/10/2020   Moderate nonproliferative diabetic retinopathy of left eye ([place]) 06/06/2020   Macular pucker, left eye 12/05/2019   Non-proliferative diabetic retinopathy, severe, right eye ([place]) 12/05/2019   Acute respiratory infection 06/23/2016   Marital dysfunction 01/22/2016   Coronary artery disease involving native coronary artery of native heart with angina pectoris ([place]) 08/22/2014   Takotsubo syndrome 08/22/2014   Obesity (BMI 30-39.9)    Recurrent ventral incisional hernia 08/20/2014   Elevated troponin     Chronic pain syndrome 08/22/2013   Chest pain at rest 04/17/2013   Essential hypertension, benign 04/17/2013   Controlled diabetes mellitus with nephropathy ([place]) 01/27/2013   HLD (hyperlipidemia) 01/27/2013   Lumbago    DOE (dyspnea on exertion)    Hypothyroidism    Vitamin B12 deficiency 12/18/2009   Depression 12/18/2009   Vitamin D deficiency disease 02/13/2009   Esophageal spasm 02/13/2009   MCI (mild cognitive impairment) 10/24/2008   Pulmonary nodule 06/18/2008   Esophageal reflux 04/12/2007   Carotid bruit 04/21/2006   Extrinsic asthma 08/25/2005   Gout 11/12/2004   Unsteady gait 10/20/2004    Past Surgical History:  Procedure Laterality Date   ABDOMINAL HYSTERECTOMY     APPENDECTOMY     BREAST SURGERY     EXPLORATORY LAPAROTOMY W/ BOWEL RESECTION  07/25/2004   PROCEDURE:  Laparoscopy, open laparotomy, resection of jejunojejunostomy   INCISIONAL HERNIA REPAIR  03/19/2006   PROCEDURE:  Open ventral hernia repair with mesh.   LAPAROSCOPIC GASTRIC BYPASS  07/22/2004   PROCEDURE:  Laparoscopic Roux-en-Y gastric bypass, antecolic, antegastric,   LAPAROSCOPIC INCISIONAL / UMBILICAL / VENTRAL HERNIA REPAIR  09/22/2005   PROCEDURE:  Laparoscopic ventral hernia repair with mesh.   LEFT HEART CATH AND CORONARY ANGIOGRAPHY N/A 11/20/2016   Procedure: Left Heart Cath and Coronary Angiography;  Surgeon: Sherren Mocha, MD;  Location: [facility];  Service: Cardiovascular;  Laterality: N/A;   LEFT HEART CATHETERIZATION WITH CORONARY ANGIOGRAM N/A 08/21/2014   Procedure: LEFT HEART CATHETERIZATION WITH CORONARY ANGIOGRAM;  Surgeon: Sherren Mocha, MD;  Location: [facility] CATH LAB;  Service: Cardiovascular;  Laterality: N/A;   stents     last time she said was 1 yr ago      OB History   No obstetric history on file.     Family History  Problem Relation Age of Onset   Stroke Mother    Stroke Father    Diabetes Father    Heart disease Father    Diabetes Son    Cancer Brother         BLADDER   Diabetes Brother     Social History   Tobacco Use   Smoking status: Never   Smokeless tobacco: Never  Vaping Use   Vaping Use: Never used  Substance Use Topics   Alcohol use: No   Drug use: No    Home Medications Prior to Admission medications   Medication Sig Start Date End Date Taking? Authorizing Provider  amLODipine (NORVASC) 2.5 MG tablet TAKE 1 TABLET BY MOUTH EVERY DAY Patient taking differently: Take 2.5 mg by mouth daily. 10/16/20  Yes Sherren Mocha, MD  aspirin 81 MG tablet Take 81 mg by mouth daily.   Yes [provider]  atorvastatin (LIPITOR) 20 MG tablet TAKE 1 TABLET BY MOUTH EVERY DAY Patient taking differently: Take 20 mg by mouth daily. 10/16/20  Yes Bhagat, Bhavinkumar, PA  cetirizine (ZYRTEC) 10 MG tablet Take 10 mg by mouth  daily as needed for allergies. For allergies   Yes [provider]  Cholecalciferol (VITAMIN D3) 5000 UNITS CAPS Take 5,000 Units by mouth daily. For supplement   Yes [provider]  citalopram (CELEXA) 40 MG tablet TAKE 1 TABLET BY MOUTH EVERY DAY FOR DEPRESSION Patient taking differently: Take 40 mg by mouth daily. 12/23/20  Yes Wardell Honour, MD  Continuous Blood Gluc Receiver (FREESTYLE LIBRE 2 READER) DEVI Use to test blood sugar three times daily.  Dx: E11.9 06/13/20  Yes Reed, Tiffany L, DO  Continuous Blood Gluc Sensor (FREESTYLE LIBRE 2 SENSOR) MISC Use to test blood sugar three times daily. Dx: E11.9 06/13/20  Yes Reed, Tiffany L, DO  docusate sodium (COLACE) 100 MG capsule Take 100 mg by mouth daily.   Yes [provider]  ferrous sulfate 325 (65 FE) MG tablet Take 325 mg by mouth daily with breakfast.   Yes [provider]  furosemide (LASIX) 20 MG tablet Take 20 mg by mouth 2 (two) times daily as needed for fluid.   Yes [provider]  glipiZIDE (GLUCOTROL XL) 10 MG 24 hr tablet TAKE 1 TABLET BY MOUTH EVERY DAY IN THE MORNING AND 1 TABLET IN EVENING FOR  SUGAR CONTROL Patient taking differently: Take 10 mg by mouth 2 (two) times daily with a meal. TAKE 1 TABLET BY MOUTH EVERY DAY IN THE MORNING AND 1 TABLET IN EVENING FOR SUGAR CONTROL 12/10/20  Yes Wardell Honour, MD  insulin NPH Human (HUMULIN N) 100 UNIT/ML injection INJECT 40 UNITS IN THE MORNING AND 40 UNITS IN THE EVENING Patient taking differently: Inject 40 Units into the skin 2 (two) times daily before a meal. 11/26/20  Yes Wardell Honour, MD  Insulin Syringes, Disposable, U-100 1 ML MISC Use to inject insulin Dx: [id number] 11/04/18  Yes Reed, Tiffany L, DO  isosorbide mononitrate (IMDUR) 30 MG 24 hr tablet TAKE 1 TABLET BY MOUTH EVERY DAY Patient taking differently: Take 30 mg by mouth daily. 10/16/20  Yes Bhagat, Bhavinkumar, PA  latanoprost (XALATAN) 0.005 % ophthalmic solution Place 1 drop into both eyes daily.   Yes [provider]  losartan-hydrochlorothiazide (HYZAAR) 50-12.5 MG tablet TAKE 1 TABLET BY MOUTH EVERY DAY FOR BLOOD PRESSURE Patient taking differently: Take 1 tablet by mouth daily. 11/05/20  Yes Bhagat, Bhavinkumar, PA  meclizine (ANTIVERT) 25 MG tablet Take 1 tablet (25 mg total) by mouth 3 (three) times daily as needed for dizziness. For dizziness 06/10/20  Yes Reed, Tiffany L, DO  nitroGLYCERIN (NITROSTAT) 0.4 MG SL tablet Place 0.4 mg under the tongue every 5 (five) minutes as needed for chest pain.   Yes [provider]  omeprazole (PRILOSEC) 20 MG capsule TAKE 1 CAPSULE BY MOUTH EVERY DAY Patient taking differently: Take 20 mg by mouth daily. 12/23/20  Yes Wardell Honour, MD  predniSONE (DELTASONE) 5 MG tablet TAKE 1 TABLET BY MOUTH EVERY DAY IN THE MORNING WITH FOOD TO REDUCE PAIN Patient taking differently: Take 5 mg by mouth daily with breakfast. 12/23/20  Yes [name], Dinah C, NP  donepezil (ARICEPT) 5 MG tablet Take 1 tablet (5 mg total) by mouth at bedtime. Patient not taking: Reported on 01/02/2021 12/10/20   Wardell Honour, MD     Allergies    Ciprofloxacin, Advair diskus [fluticasone-salmeterol], Albuterol, Alendronate sodium, Benadryl [diphenhydramine hcl], Benzonatate, Cephalexin, Gabapentin, Iohexol, Keflex [cephalexin], Morphine, Other, Propoxyphene hcl, Reclast [zoledronic acid], Avapro [irbesartan], Codeine, Tessalon perles, and Tramadol  Review of Systems  Review of Systems  Constitutional:  Negative for chills, diaphoresis and fever.  HENT:  Negative for congestion, drooling, ear pain, rhinorrhea, sneezing and sore throat.   Eyes:  Negative for pain and visual disturbance.  Respiratory:  Negative for cough, shortness of breath, wheezing and stridor.   Cardiovascular:  Negative for chest pain, palpitations and leg swelling.  Gastrointestinal:  Positive for abdominal pain, constipation and nausea. Negative for abdominal distention, anorexia, diarrhea, flatus, hematochezia and vomiting.  Endocrine: Negative for polydipsia and polyuria.  Genitourinary:  Negative for difficulty urinating, dysuria, flank pain, hematuria, pelvic pain and urgency.  Musculoskeletal:  Negative for arthralgias, back pain, neck pain and neck stiffness.  Skin:  Negative for color change, rash and wound.  Neurological:  Negative for dizziness, tremors, seizures, syncope, facial asymmetry, speech difficulty, weakness, light-headedness, numbness and headaches.  Psychiatric/Behavioral:  Negative for behavioral problems and confusion.   All other systems reviewed and are negative.  Physical Exam Updated Vital Signs BP 119/68   Pulse (!) 49   Temp 98.5 F (36.9 C) (Oral)   Resp 17   Ht '5\' 2"'$  (1.575 m)   Wt 90.7 kg   SpO2 98%   BMI 36.58 kg/m   Physical Exam Vitals and nursing note reviewed.  Constitutional:      General: She is not in acute distress.    Appearance: Normal appearance. She is well-developed. She is obese.  HENT:     Head: Normocephalic and atraumatic.     Right Ear: External ear normal.     Left Ear:  External ear normal.     Nose: Nose normal.     Mouth/Throat:     Mouth: Mucous membranes are moist.     Pharynx: Oropharynx is clear. No oropharyngeal exudate.  Eyes:     Extraocular Movements: Extraocular movements intact.     Conjunctiva/sclera: Conjunctivae normal.     Pupils: Pupils are equal, round, and reactive to light.  Neck:     Vascular: No carotid bruit.  Cardiovascular:     Rate and Rhythm: Normal rate and regular rhythm.     Pulses: Normal pulses.     Heart sounds: Normal heart sounds. No murmur heard.   No gallop.  Pulmonary:     Effort: Pulmonary effort is normal. No respiratory distress.     Breath sounds: Normal breath sounds. No stridor. No wheezing, rhonchi or rales.  Chest:     Chest wall: No tenderness.  Abdominal:     General: Abdomen is flat. Bowel sounds are normal. There is distension.     Palpations: Abdomen is soft.     Tenderness: There is abdominal tenderness. There is no right CVA tenderness, left CVA tenderness, guarding or rebound.     Hernia: A hernia is present.  Musculoskeletal:        General: Normal range of motion.     Cervical back: Normal range of motion and neck supple. No rigidity or tenderness.     Right lower leg: No edema.     Left lower leg: No edema.  Skin:    General: Skin is warm and dry.  Neurological:     General: No focal deficit present.     Mental Status: She is alert and oriented to person, place, and time. Mental status is at baseline.     Cranial Nerves: No cranial nerve deficit.     Sensory: No sensory deficit.     Motor: No weakness.     Coordination: Coordination normal.  Gait: Gait normal.  Psychiatric:        Mood and Affect: Mood normal.        Behavior: Behavior normal.        Thought Content: Thought content normal.        Judgment: Judgment normal.    ED Results / Procedures / Treatments   Labs (all labs ordered are listed, but only abnormal results are displayed) Labs Reviewed  CBC WITH  DIFFERENTIAL/PLATELET - Abnormal; Notable for the following components:      Result Value   WBC 13.5 (*)    Neutro Abs 11.1 (*)    All other components within normal limits  COMPREHENSIVE METABOLIC PANEL - Abnormal; Notable for the following components:   Glucose, Bld 105 (*)    Total Protein 6.3 (*)    Albumin 3.4 (*)    GFR, Estimated 59 (*)    All other components within normal limits  URINALYSIS, ROUTINE W REFLEX MICROSCOPIC - Abnormal; Notable for the following components:   Protein, ur 30 (*)    Leukocytes,Ua TRACE (*)    All other components within normal limits  BASIC METABOLIC PANEL - Abnormal; Notable for the following components:   Potassium 3.4 (*)    Glucose, Bld 63 (*)    Calcium 8.7 (*)    All other components within normal limits  HEMOGLOBIN A1C - Abnormal; Notable for the following components:   Hgb A1c MFr Bld 6.6 (*)    All other components within normal limits  CBG MONITORING, ED - Abnormal; Notable for the following components:   Glucose-Capillary 107 (*)    All other components within normal limits  I-STAT CHEM 8, ED - Abnormal; Notable for the following components:   Glucose, Bld 107 (*)    All other components within normal limits  CBG MONITORING, ED - Abnormal; Notable for the following components:   Glucose-Capillary 108 (*)    All other components within normal limits  CBG MONITORING, ED - Abnormal; Notable for the following components:   Glucose-Capillary 197 (*)    All other components within normal limits  CBG MONITORING, ED - Abnormal; Notable for the following components:   Glucose-Capillary 55 (*)    All other components within normal limits  LIPASE, BLOOD  CBC  GLUCOSE, CAPILLARY  CBG MONITORING, ED    EKG None  Radiology CT Abdomen Pelvis Wo Contrast  Addendum Date: 01/02/2021   ADDENDUM REPORT: 01/02/2021 21:21 ADDENDUM: No definite internal hernia identified. Consider PO contrast administration with x-ray small-bowel follow-through  to evaluate for passage of PO contrast past the small bowel into the large bowel. These results were called by telephone at the time of interpretation on 01/02/2021 at 9:18 pm to provider Dr. Dema Severin from surgery, who verbally acknowledged these results. Discussed similar findings and follow-up imaging with PA Margarita Mail and ED Attending earlier. Electronically Signed   By: Iven Finn M.D.   On: 01/02/2021 21:21   Addendum Date: 01/02/2021   ADDENDUM REPORT: 01/02/2021 18:51 ADDENDUM: These results were called by telephone at the time of interpretation on 01/02/2021 at 6:51 pm to provider [name] , who verbally acknowledged these results. Electronically Signed   By: Iven Finn M.D.   On: 01/02/2021 18:51   Result Date: 01/02/2021 CLINICAL DATA:  Nausea and vomiting. EXAM: CT ABDOMEN AND PELVIS WITHOUT CONTRAST TECHNIQUE: Multidetector CT imaging of the abdomen and pelvis was performed following the standard protocol without IV contrast. COMPARISON:  CT abdomen pelvis 08/19/2014, CT  abdomen pelvis 01/24/2009, CT abdomen pelvis 07/24/2004 FINDINGS: Lower chest: No acute abnormality. Hepatobiliary: No focal liver abnormality. Calcified gallstones within the gallbladder lumen. No associated gallbladder wall thickening or pericholecystic fluid. No biliary dilatation. Pancreas: No focal lesion. Normal pancreatic contour. No surrounding inflammatory changes. No main pancreatic ductal dilatation. Spleen: Normal in size without focal abnormality. Adrenals/Urinary Tract: No adrenal nodule bilaterally. Bilateral perinephric stranding nonspecific. No nephrolithiasis and no hydronephrosis. Interval increase in size of an exophytic 6.3 cm (from 2.2 cm) left renal lesion with a density of 20 Hounsfield units. Slight interval increase in size of a smaller fluid density lesion measuring up to 4 cm. Subcentimeter hypodensities too small to characterize. There is a 1 cm exophytic lesion along the inferior pole of the  right kidney with a density of simple fluid. There is interval increase in size an exophytic inferior right renal pole 5.3 cm lesion with a density of 22 Hounsfield units. Subcentimeter hyperdensity along the superior pole of the right kidney (6:90) too small to characterize. No ureterolithiasis or hydroureter. The urinary bladder is unremarkable. Stomach/Bowel: Surgical changes related to Roux-en-Y gastric bypass. Stomach is within normal limits. Several loops of dilated small bowel along the anastomotic sutures in the mid abdomen with associated fecalized material. The caliber of the small bowel measures up to 6.6 cm (3:36) as well as 6.4 cm (3:45) no small bowel wall thickening. No pneumatosis. No evidence of large bowel wall thickening or dilatation. Appendix appears normal. Vascular/Lymphatic: No abdominal aorta or iliac aneurysm. Mild atherosclerotic plaque of the aorta and its branches. No abdominal, pelvic, or inguinal lymphadenopathy. Reproductive: Status post hysterectomy. No adnexal masses. Other: Interval increase in size of a lobulated partially calcified soft tissue density within the anterior upper abdomen measuring up to 4.9 x 6 cm (3:18) slowly enlarged but visualized at least as far back as 2010. No intraperitoneal free fluid. No intraperitoneal free gas. No organized fluid collection. Musculoskeletal: Ventral wall hernia repair with mesh with diastasis rectus and recurrence of the ventral wall hernia with a large abdominal defect. The hernia contains a loop of transverse colon as well as several loops of small bowel, including the loops involving the obstruction. No suspicious lytic or blastic osseous lesions. No acute displaced fracture. Multilevel degenerative changes of the spine. IMPRESSION: 1. Several loops of dilated small bowel along the jejunojejunosotmy and small bowel resection anastomotic sutures in the mid abdomen with associated luminal fecalized material in a patient status post  Roux-en-Y gastric bypass and small bowel resection. Findings may represent early/partial small bowel obstruction possibly due to adhesions. No bowel perforation. No pneumatosis. 2. Ventral wall hernia repair with mesh with diastasis rectus and recurrence of the ventral wall hernia with a large abdominal defect. The hernia contains a loop of transverse colon as well as several loops of small bowel, including the loops involving the obstruction. The hernia is not the cause of the obstruction. 3. Interval increase in size of several indeterminate renal lesions. Recommend nonemergent MRI renal protocol for further evaluation. 4. Cholelithiasis with no findings of acute cholecystitis. 5. Interval increase in size (compared to 2010 in 2016) of a lobulated partially calcified soft tissue density within the upper abdomen measuring up to 4.9 x 6 cm. Finding may represent postsurgical changes. Electronically Signed: By: Iven Finn M.D. On: 01/02/2021 18:46   DG Abd Portable 1V-Small Bowel Obstruction Protocol-initial, 8 hr delay  Result Date: 01/03/2021 CLINICAL DATA:  8 hour delay.  Small bowel obstruction. EXAM: PORTABLE ABDOMEN -  1 VIEW COMPARISON:  CT from yesterday FINDINGS: Enteric contrast has reached the distal colon. There are still dilated small bowel loops in the central abdomen. No concerning mass effect or gas collection. Osteopenia and atherosclerosis. IMPRESSION: Partial small bowel obstruction. Enteric contrast has reached the nondilated colon. Electronically Signed   By: Monte Fantasia M.D.   On: 01/03/2021 07:10    Procedures Procedures   Medications Ordered in ED Medications  lactated ringers infusion ( Intravenous New Bag/Given 01/03/21 1132)  enoxaparin (LOVENOX) injection 40 mg (40 mg Subcutaneous Given 01/03/21 0956)  acetaminophen (TYLENOL) tablet 650 mg (has no administration in time range)    Or  acetaminophen (TYLENOL) suppository 650 mg (has no administration in time range)   ondansetron (ZOFRAN) tablet 4 mg (has no administration in time range)    Or  ondansetron (ZOFRAN) injection 4 mg (has no administration in time range)  amLODipine (NORVASC) tablet 2.5 mg (2.5 mg Oral Given 01/03/21 0956)  insulin aspart (novoLOG) injection 0-9 Units (0 Units Subcutaneous Not Given 01/03/21 1155)  citalopram (CELEXA) tablet 40 mg (40 mg Oral Given 01/03/21 0956)  losartan (COZAAR) tablet 50 mg (50 mg Oral Given 01/03/21 0957)  isosorbide mononitrate (IMDUR) 24 hr tablet 30 mg (30 mg Oral Given 01/03/21 0957)  predniSONE (DELTASONE) tablet 5 mg (5 mg Oral Given 01/03/21 0817)  pantoprazole (PROTONIX) EC tablet 40 mg (40 mg Oral Given 01/03/21 0957)  labetalol (NORMODYNE) injection 5-10 mg (has no administration in time range)  hydrALAZINE (APRESOLINE) injection 5 mg (has no administration in time range)  insulin glargine-yfgn (SEMGLEE) injection 10 Units (has no administration in time range)  latanoprost (XALATAN) 0.005 % ophthalmic solution 1 drop (has no administration in time range)  diatrizoate meglumine-sodium (GASTROGRAFIN) 66-10 % solution 90 mL (90 mLs Per NG tube Given 01/02/21 2256)    ED Course  I have reviewed the triage vital signs and the nursing notes.  Pertinent labs & imaging results that were available during my care of the patient were reviewed by me and considered in my medical decision making (see chart for details).    MDM Rules/Calculators/A&P                         Patient is a 80 year old female with a past medical history of hypertension, type 1 diabetes, history of previous abdominal surgeries is presenting for 7 days of constipation. Patient is hemodynamically stable and in no acute distress. Her physical exam is notable for a ventral hernia that is reducible with no signs of incarceration. Her CBC was notable for a leukocytosis. Her CMP was unremarkable. Her UA shows no signs of infection. Her CT abdomen/pelvis was concern for small bowel obstruction. There  are no signs of peritonitis.   General surgery was consulted and recommending admission. Patient will be admitted to hospitalist for management of small bowel obstruction.   Patient states compliance and understanding of the plan. I explained labs and imaging to the patient. No further questions at this time from the patient.  The plan for this patient was discussed with Dr. Kathrynn Humble, who voiced agreement and who oversaw evaluation and treatment of this patient.   Final Clinical Impression(s) / ED Diagnoses Final diagnoses:  Small bowel obstruction [facility])    Rx / DC Orders ED Discharge Orders     None        Doretha Sou, MD 01/03/21 [place], [place], MD 01/03/21 1253

## 2021-01-02 NOTE — ED Notes (Signed)
Pt transported to CT ?

## 2021-01-02 NOTE — ED Provider Notes (Signed)
Emergency Medicine Provider Triage Evaluation Note  [name] , a 80 y.o. female  was evaluated in triage.  Pt complains of nausea and vomiting, she is having bowel movements about a week ago and now is not passing gas.  She said that her blood sugar got down to 32 today.  She tried eating lettuce which did not help.  She was able to eat a piece of candy and orange after her blood sugar got low.  She denies any severe abdominal pain but does have a large abdominal wall hernia.  She denies fevers or chills.  Review of Systems  Positive: Vomiting Negative: Fever  Physical Exam  BP (!) 155/69   Pulse 94   Temp 98.9 F (37.2 C) (Oral)   Resp 14   Ht '5\' 2"'$  (1.575 m)   Wt 90.7 kg   SpO2 93%   BMI 36.58 kg/m  Gen:   Awake, no distress   Resp:  Normal effort  MSK:   Moves extremities without difficulty  Other:  Abdominal tenderness  Medical Decision Making  Medically screening exam initiated at 5:32 PM.  Appropriate orders placed.  [name] was informed that the remainder of the evaluation will be completed by another provider, this initial triage assessment does not replace that evaluation, and the importance of remaining in the ED until their evaluation is complete.   Patient here with vomiting, hypoglycemia.  Given, soda here in the emergency department.  Initial CBG of 100 here.  I ordered labs.  Patient of note has a contrast allergy.  I ordered a noncontrast CT with patient.   Margarita Mail, PA-C 01/02/21 [place], DO 01/02/21 [id number]

## 2021-01-02 NOTE — ED Notes (Signed)
[phone] Family- please call when roomed.

## 2021-01-02 NOTE — ED Notes (Signed)
Pt has a self monitoring glucose device. Sugar reading 102.

## 2021-01-02 NOTE — ED Triage Notes (Addendum)
Pt states that her sugar got down to 32 prior to arrival. Pt states she has not been having good BMs for the last 7-8 days. Pt states she will have a BM x 1 after taking dulcolax, last BM yesterday "but it wasn't much". Pt then adds that she had emesis this morning as well.

## 2021-01-02 NOTE — ED Notes (Signed)
General surgery at bedside. 

## 2021-01-02 NOTE — Consult Note (Addendum)
CC: Constipation  Requesting provider: Doretha Sou MD  HPI: [name] is an 80 y.o. female with hx of HTN, HLD DM, OSA, COPD, with surgical history significant for laparoscopic Roux-en-Y gastric bypass ~2007. She had a postoperative complication that is not clear from her history, possible leak vs 'hernia', and was reexplored with an open incision one day after her bypass. She subsequently developed a large upper abdominal ventral hernia which was repaired on 2 separate occasions with mesh with recurrence both times. She has multiple prior admissions for a similar presentation - distention, nausea, most recently 2015 when she was seen by our service. This is now her 53rd. Both prior have resolved nonoperatively. She reports no BM in last week with exception of 1 that occurred 3 hrs after PO dulcolax and was quite small. Yesterday had nausea and emesis x1, same today.  She denies any abdominal pain whatsoever. She believes this feels similar to when she presented 2016. She denies fever/chills. Her hernia that she has is large and has not been giving her trouble  She saw Dr. Hassell Done in our office 11/2019 and was determined to not be candidate for surgery to repair the hernia given her loss of domain, comorbidities, obesity, morbidity of procedure.  She currently resides alone at home; is here with her niece. She mobilizes with her foldable electric wheelchair.  Past Medical History:  Diagnosis Date   Abdominal pain, unspecified site    Abnormality of gait    Apnea    Cardiomegaly    Chest pain, unspecified    Complication of anesthesia    hard time waking up   Depressive disorder, not elsewhere classified    Diabetes mellitus    Diabetic retinopathy (HCC)    Dizziness and giddiness    Dyskinesia of esophagus    Edema    Extrinsic asthma, unspecified    Female stress incontinence    Gout, unspecified    High cholesterol    Hypertension    Lipoma of other skin and subcutaneous  tissue    Lumbago    Memory loss    Migraine without aura, with intractable migraine, so stated, with status migrainosus    Mild cognitive impairment, so stated    Nonspecific (abnormal) findings on radiological and other examination of abdominal area, including retroperitoneum    Nonspecific abnormal results of liver function study    Obesity, unspecified    Obstructive chronic bronchitis with exacerbation (HCC)    Other and unspecified hyperlipidemia    Other B-complex deficiencies    Other nonspecific abnormal serum enzyme levels    Other specified cardiac dysrhythmias(427.89)    Other specified disease of sebaceous glands    Other symptoms involving cardiovascular system    Pain in joint, ankle and foot    Pain in joint, pelvic region and thigh    Pain in joint, shoulder region    Palpitations    Reflux esophagitis    Shortness of breath    Tension headache    Type I (juvenile type) diabetes mellitus without mention of complication, not stated as uncontrolled    Type I (juvenile type) diabetes mellitus without mention of complication, uncontrolled    Unspecified essential hypertension    Unspecified hypothyroidism    Unspecified vitamin D deficiency    Ventral hernia, unspecified, without mention of obstruction or gangrene     Past Surgical History:  Procedure Laterality Date   ABDOMINAL HYSTERECTOMY     APPENDECTOMY  BREAST SURGERY     EXPLORATORY LAPAROTOMY W/ BOWEL RESECTION  07/25/2004   PROCEDURE:  Laparoscopy, open laparotomy, resection of jejunojejunostomy   INCISIONAL HERNIA REPAIR  03/19/2006   PROCEDURE:  Open ventral hernia repair with mesh.   LAPAROSCOPIC GASTRIC BYPASS  07/22/2004   PROCEDURE:  Laparoscopic Roux-en-Y gastric bypass, antecolic, antegastric,   LAPAROSCOPIC INCISIONAL / UMBILICAL / VENTRAL HERNIA REPAIR  09/22/2005   PROCEDURE:  Laparoscopic ventral hernia repair with mesh.   LEFT HEART CATH AND CORONARY ANGIOGRAPHY N/A 11/20/2016   Procedure:  Left Heart Cath and Coronary Angiography;  Surgeon: Sherren Mocha, MD;  Location: [facility];  Service: Cardiovascular;  Laterality: N/A;   LEFT HEART CATHETERIZATION WITH CORONARY ANGIOGRAM N/A 08/21/2014   Procedure: LEFT HEART CATHETERIZATION WITH CORONARY ANGIOGRAM;  Surgeon: Sherren Mocha, MD;  Location: [facility];  Service: Cardiovascular;  Laterality: N/A;   stents     last time she said was 1 yr ago     Family History  Problem Relation Age of Onset   Stroke Mother    Stroke Father    Diabetes Father    Heart disease Father    Diabetes Son    Cancer Brother        BLADDER   Diabetes Brother     Social:  reports that she has never smoked. She has never used smokeless tobacco. She reports that she does not drink alcohol and does not use drugs.  Allergies:  Allergies  Allergen Reactions   Ciprofloxacin Rash   Advair Diskus [Fluticasone-Salmeterol] Other (See Comments)    Throat closes   Albuterol     REACTION: closes throat   Alendronate Sodium Itching   Benadryl [Diphenhydramine Hcl] Itching   Benzonatate     REACTION: rash/hives   Cephalexin Nausea Only   Gabapentin Other (See Comments)    Loss of memory   Iohexol      Desc: rash and DIF BREATHING    Keflex [Cephalexin] Nausea And Vomiting   Morphine Nausea And Vomiting   Other Other (See Comments)    Decongestants- keeps pt awake at night, increased heart rate   Propoxyphene Hcl     Doesn't recall   Reclast [Zoledronic Acid] Other (See Comments)    Chest pain   Avapro [Irbesartan] Rash   Codeine Nausea And Vomiting, Swelling and Rash   Tessalon Perles Rash   Tramadol Nausea Only and Rash    Medications: I have reviewed the patient's current medications.  Results for orders placed or performed during the hospital encounter of 01/02/21 (from the past 48 hour(s))  CBG monitoring, ED     Status: Abnormal   Collection Time: 01/02/21  5:16 PM  Result Value Ref Range   Glucose-Capillary 107 (H)  70 - 99 mg/dL    Comment: Glucose reference range applies only to samples taken after fasting for at least 8 hours.  CBC with Differential     Status: Abnormal   Collection Time: 01/02/21  5:33 PM  Result Value Ref Range   WBC 13.5 (H) 4.0 - 10.5 K/uL   RBC 4.56 3.87 - 5.11 MIL/uL   Hemoglobin 13.4 12.0 - 15.0 g/dL   HCT 43.1 36.0 - 46.0 %   MCV 94.5 80.0 - 100.0 fL   MCH 29.4 26.0 - 34.0 pg   MCHC 31.1 30.0 - 36.0 g/dL   RDW 13.8 11.5 - 15.5 %   Platelets 333 150 - 400 K/uL   nRBC 0.0 0.0 - 0.2 %  Neutrophils Relative % 83 %   Neutro Abs 11.1 (H) 1.7 - 7.7 K/uL   Lymphocytes Relative 11 %   Lymphs Abs 1.5 0.7 - 4.0 K/uL   Monocytes Relative 5 %   Monocytes Absolute 0.6 0.1 - 1.0 K/uL   Eosinophils Relative 0 %   Eosinophils Absolute 0.1 0.0 - 0.5 K/uL   Basophils Relative 1 %   Basophils Absolute 0.1 0.0 - 0.1 K/uL   Immature Granulocytes 0 %   Abs Immature Granulocytes 0.03 0.00 - 0.07 K/uL    Comment: Performed at [facility] [address] [place], [place] [address]  Comprehensive metabolic panel     Status: Abnormal   Collection Time: 01/02/21  5:33 PM  Result Value Ref Range   Sodium 141 135 - 145 mmol/L   Potassium 3.9 3.5 - 5.1 mmol/L   Chloride 105 98 - 111 mmol/L   CO2 29 22 - 32 mmol/L   Glucose, Bld 105 (H) 70 - 99 mg/dL    Comment: Glucose reference range applies only to samples taken after fasting for at least 8 hours.   BUN 14 8 - 23 mg/dL   Creatinine, Ser 0.96 0.44 - 1.00 mg/dL   Calcium 9.4 8.9 - 10.3 mg/dL   Total Protein 6.3 (L) 6.5 - 8.1 g/dL   Albumin 3.4 (L) 3.5 - 5.0 g/dL   AST 19 15 - 41 U/L   ALT 19 0 - 44 U/L   Alkaline Phosphatase 76 38 - 126 U/L   Total Bilirubin 1.0 0.3 - 1.2 mg/dL   GFR, Estimated 59 (L) >60 mL/min    Comment: (NOTE) Calculated using the CKD-EPI Creatinine Equation (2021)    Anion gap 7 5 - 15    Comment: Performed at [facility] [address], [place], [place] [address]  Lipase, blood     Status:  None   Collection Time: 01/02/21  5:33 PM  Result Value Ref Range   Lipase 27 11 - 51 U/L    Comment: Performed at [facility], [place] [address], [place], [place] [address]  I-Stat Chem 8, ED     Status: Abnormal   Collection Time: 01/02/21  5:50 PM  Result Value Ref Range   Sodium 141 135 - 145 mmol/L   Potassium 3.8 3.5 - 5.1 mmol/L   Chloride 103 98 - 111 mmol/L   BUN 15 8 - 23 mg/dL   Creatinine, Ser 1.00 0.44 - 1.00 mg/dL   Glucose, Bld 107 (H) 70 - 99 mg/dL    Comment: Glucose reference range applies only to samples taken after fasting for at least 8 hours.   Calcium, Ion 1.22 1.15 - 1.40 mmol/L   TCO2 29 22 - 32 mmol/L   Hemoglobin 13.9 12.0 - 15.0 g/dL   HCT 41.0 36.0 - 46.0 %  CBG monitoring, ED     Status: Abnormal   Collection Time: 01/02/21  9:42 PM  Result Value Ref Range   Glucose-Capillary 108 (H) 70 - 99 mg/dL    Comment: Glucose reference range applies only to samples taken after fasting for at least 8 hours.    CT Abdomen Pelvis Wo Contrast  Addendum Date: 01/02/2021   ADDENDUM REPORT: 01/02/2021 21:21 ADDENDUM: No definite internal hernia identified. Consider PO contrast administration with x-ray small-bowel follow-through to evaluate for passage of PO contrast past the small bowel into the large bowel. These results were called by telephone at the time of interpretation  on 01/02/2021 at 9:18 pm to provider Dr. Dema Severin from surgery, who verbally acknowledged these results. Discussed similar findings and follow-up imaging with PA Margarita Mail and ED Attending earlier. Electronically Signed   By: Iven Finn M.D.   On: 01/02/2021 21:21   Addendum Date: 01/02/2021   ADDENDUM REPORT: 01/02/2021 18:51 ADDENDUM: These results were called by telephone at the time of interpretation on 01/02/2021 at 6:51 pm to provider [name] , who verbally acknowledged these results. Electronically Signed   By: Iven Finn M.D.   On: 01/02/2021 18:51   Result Date:  01/02/2021 CLINICAL DATA:  Nausea and vomiting. EXAM: CT ABDOMEN AND PELVIS WITHOUT CONTRAST TECHNIQUE: Multidetector CT imaging of the abdomen and pelvis was performed following the standard protocol without IV contrast. COMPARISON:  CT abdomen pelvis 08/19/2014, CT abdomen pelvis 01/24/2009, CT abdomen pelvis 07/24/2004 FINDINGS: Lower chest: No acute abnormality. Hepatobiliary: No focal liver abnormality. Calcified gallstones within the gallbladder lumen. No associated gallbladder wall thickening or pericholecystic fluid. No biliary dilatation. Pancreas: No focal lesion. Normal pancreatic contour. No surrounding inflammatory changes. No main pancreatic ductal dilatation. Spleen: Normal in size without focal abnormality. Adrenals/Urinary Tract: No adrenal nodule bilaterally. Bilateral perinephric stranding nonspecific. No nephrolithiasis and no hydronephrosis. Interval increase in size of an exophytic 6.3 cm (from 2.2 cm) left renal lesion with a density of 20 Hounsfield units. Slight interval increase in size of a smaller fluid density lesion measuring up to 4 cm. Subcentimeter hypodensities too small to characterize. There is a 1 cm exophytic lesion along the inferior pole of the right kidney with a density of simple fluid. There is interval increase in size an exophytic inferior right renal pole 5.3 cm lesion with a density of 22 Hounsfield units. Subcentimeter hyperdensity along the superior pole of the right kidney (6:90) too small to characterize. No ureterolithiasis or hydroureter. The urinary bladder is unremarkable. Stomach/Bowel: Surgical changes related to Roux-en-Y gastric bypass. Stomach is within normal limits. Several loops of dilated small bowel along the anastomotic sutures in the mid abdomen with associated fecalized material. The caliber of the small bowel measures up to 6.6 cm (3:36) as well as 6.4 cm (3:45) no small bowel wall thickening. No pneumatosis. No evidence of large bowel wall  thickening or dilatation. Appendix appears normal. Vascular/Lymphatic: No abdominal aorta or iliac aneurysm. Mild atherosclerotic plaque of the aorta and its branches. No abdominal, pelvic, or inguinal lymphadenopathy. Reproductive: Status post hysterectomy. No adnexal masses. Other: Interval increase in size of a lobulated partially calcified soft tissue density within the anterior upper abdomen measuring up to 4.9 x 6 cm (3:18) slowly enlarged but visualized at least as far back as 2010. No intraperitoneal free fluid. No intraperitoneal free gas. No organized fluid collection. Musculoskeletal: Ventral wall hernia repair with mesh with diastasis rectus and recurrence of the ventral wall hernia with a large abdominal defect. The hernia contains a loop of transverse colon as well as several loops of small bowel, including the loops involving the obstruction. No suspicious lytic or blastic osseous lesions. No acute displaced fracture. Multilevel degenerative changes of the spine. IMPRESSION: 1. Several loops of dilated small bowel along the jejunojejunosotmy and small bowel resection anastomotic sutures in the mid abdomen with associated luminal fecalized material in a patient status post Roux-en-Y gastric bypass and small bowel resection. Findings may represent early/partial small bowel obstruction possibly due to adhesions. No bowel perforation. No pneumatosis. 2. Ventral wall hernia repair with mesh with diastasis rectus and recurrence of the  ventral wall hernia with a large abdominal defect. The hernia contains a loop of transverse colon as well as several loops of small bowel, including the loops involving the obstruction. The hernia is not the cause of the obstruction. 3. Interval increase in size of several indeterminate renal lesions. Recommend nonemergent MRI renal protocol for further evaluation. 4. Cholelithiasis with no findings of acute cholecystitis. 5. Interval increase in size (compared to 2010 in  2016) of a lobulated partially calcified soft tissue density within the upper abdomen measuring up to 4.9 x 6 cm. Finding may represent postsurgical changes. Electronically Signed: By: Iven Finn M.D. On: 01/02/2021 18:46    ROS - all of the below systems have been reviewed with the patient and positives are indicated with bold text General: chills, fever or night sweats Eyes: blurry vision or double vision ENT: epistaxis or sore throat Allergy/Immunology: itchy/watery eyes or nasal congestion Hematologic/Lymphatic: bleeding problems, blood clots or swollen lymph nodes Endocrine: temperature intolerance or unexpected weight changes Breast: new or changing breast lumps or nipple discharge Resp: cough, shortness of breath, or wheezing CV: chest pain or dyspnea on exertion GI: as per HPI GU: dysuria, trouble voiding, or hematuria MSK: joint pain or joint stiffness Neuro: TIA or stroke symptoms Derm: pruritus and skin lesion changes Psych: anxiety and depression  PE Blood pressure (!) 164/70, pulse 73, temperature 98.9 F (37.2 C), temperature source Oral, resp. rate 15, height '5\' 2"'$  (1.575 m), weight 90.7 kg, SpO2 97 %. Constitutional: NAD, sitting up in bed; conversant; no deformities Eyes: Moist conjunctiva; no lid lag; anicteric; wearing glasses; PERRL Neck: Trachea midline; no thyromegaly Lungs: Normal respiratory effort; no tactile fremitus CV: RRR; no palpable thrills; no pitting edema GI: Abd obese, soft, nontender throughout; ventral hernia is soft, no incarceration; no palpable hepatosplenomegaly MSK: Normal range of motion of extremities; no clubbing/cyanosis Psychiatric: Appropriate affect; alert and oriented x3 Lymphatic: No palpable cervical or axillary lymphadenopathy  Results for orders placed or performed during the hospital encounter of 01/02/21 (from the past 48 hour(s))  CBG monitoring, ED     Status: Abnormal   Collection Time: 01/02/21  5:16 PM  Result Value  Ref Range   Glucose-Capillary 107 (H) 70 - 99 mg/dL    Comment: Glucose reference range applies only to samples taken after fasting for at least 8 hours.  CBC with Differential     Status: Abnormal   Collection Time: 01/02/21  5:33 PM  Result Value Ref Range   WBC 13.5 (H) 4.0 - 10.5 K/uL   RBC 4.56 3.87 - 5.11 MIL/uL   Hemoglobin 13.4 12.0 - 15.0 g/dL   HCT 43.1 36.0 - 46.0 %   MCV 94.5 80.0 - 100.0 fL   MCH 29.4 26.0 - 34.0 pg   MCHC 31.1 30.0 - 36.0 g/dL   RDW 13.8 11.5 - 15.5 %   Platelets 333 150 - 400 K/uL   nRBC 0.0 0.0 - 0.2 %   Neutrophils Relative % 83 %   Neutro Abs 11.1 (H) 1.7 - 7.7 K/uL   Lymphocytes Relative 11 %   Lymphs Abs 1.5 0.7 - 4.0 K/uL   Monocytes Relative 5 %   Monocytes Absolute 0.6 0.1 - 1.0 K/uL   Eosinophils Relative 0 %   Eosinophils Absolute 0.1 0.0 - 0.5 K/uL   Basophils Relative 1 %   Basophils Absolute 0.1 0.0 - 0.1 K/uL   Immature Granulocytes 0 %   Abs Immature Granulocytes 0.03 0.00 - 0.07 K/uL  Comment: Performed at [facility], [place] [address] [place], [place] [address]  Comprehensive metabolic panel     Status: Abnormal   Collection Time: 01/02/21  5:33 PM  Result Value Ref Range   Sodium 141 135 - 145 mmol/L   Potassium 3.9 3.5 - 5.1 mmol/L   Chloride 105 98 - 111 mmol/L   CO2 29 22 - 32 mmol/L   Glucose, Bld 105 (H) 70 - 99 mg/dL    Comment: Glucose reference range applies only to samples taken after fasting for at least 8 hours.   BUN 14 8 - 23 mg/dL   Creatinine, Ser 0.96 0.44 - 1.00 mg/dL   Calcium 9.4 8.9 - 10.3 mg/dL   Total Protein 6.3 (L) 6.5 - 8.1 g/dL   Albumin 3.4 (L) 3.5 - 5.0 g/dL   AST 19 15 - 41 U/L   ALT 19 0 - 44 U/L   Alkaline Phosphatase 76 38 - 126 U/L   Total Bilirubin 1.0 0.3 - 1.2 mg/dL   GFR, Estimated 59 (L) >60 mL/min    Comment: (NOTE) Calculated using the CKD-EPI Creatinine Equation (2021)    Anion gap 7 5 - 15    Comment: Performed at [facility] [address], [place],  [place] [address]  Lipase, blood     Status: None   Collection Time: 01/02/21  5:33 PM  Result Value Ref Range   Lipase 27 11 - 51 U/L    Comment: Performed at [facility], [place] [address] [place], [place] [address]  I-Stat Chem 8, ED     Status: Abnormal   Collection Time: 01/02/21  5:50 PM  Result Value Ref Range   Sodium 141 135 - 145 mmol/L   Potassium 3.8 3.5 - 5.1 mmol/L   Chloride 103 98 - 111 mmol/L   BUN 15 8 - 23 mg/dL   Creatinine, Ser 1.00 0.44 - 1.00 mg/dL   Glucose, Bld 107 (H) 70 - 99 mg/dL    Comment: Glucose reference range applies only to samples taken after fasting for at least 8 hours.   Calcium, Ion 1.22 1.15 - 1.40 mmol/L   TCO2 29 22 - 32 mmol/L   Hemoglobin 13.9 12.0 - 15.0 g/dL   HCT 41.0 36.0 - 46.0 %  CBG monitoring, ED     Status: Abnormal   Collection Time: 01/02/21  9:42 PM  Result Value Ref Range   Glucose-Capillary 108 (H) 70 - 99 mg/dL    Comment: Glucose reference range applies only to samples taken after fasting for at least 8 hours.    CT Abdomen Pelvis Wo Contrast  Addendum Date: 01/02/2021   ADDENDUM REPORT: 01/02/2021 21:21 ADDENDUM: No definite internal hernia identified. Consider PO contrast administration with x-ray small-bowel follow-through to evaluate for passage of PO contrast past the small bowel into the large bowel. These results were called by telephone at the time of interpretation on 01/02/2021 at 9:18 pm to provider Dr. Dema Severin from surgery, who verbally acknowledged these results. Discussed similar findings and follow-up imaging with PA Margarita Mail and ED Attending earlier. Electronically Signed   By: Iven Finn M.D.   On: 01/02/2021 21:21   Addendum Date: 01/02/2021   ADDENDUM REPORT: 01/02/2021 18:51 ADDENDUM: These results were called by telephone at the time of interpretation on 01/02/2021 at 6:51 pm to provider [name] , who verbally acknowledged these results. Electronically Signed   By: Iven Finn M.D.   On:  01/02/2021 18:51   Result Date: 01/02/2021 CLINICAL DATA:  Nausea and vomiting. EXAM: CT ABDOMEN AND PELVIS WITHOUT CONTRAST TECHNIQUE: Multidetector CT imaging of the abdomen and pelvis was performed following the standard protocol without IV contrast. COMPARISON:  CT abdomen pelvis 08/19/2014, CT abdomen pelvis 01/24/2009, CT abdomen pelvis 07/24/2004 FINDINGS: Lower chest: No acute abnormality. Hepatobiliary: No focal liver abnormality. Calcified gallstones within the gallbladder lumen. No associated gallbladder wall thickening or pericholecystic fluid. No biliary dilatation. Pancreas: No focal lesion. Normal pancreatic contour. No surrounding inflammatory changes. No main pancreatic ductal dilatation. Spleen: Normal in size without focal abnormality. Adrenals/Urinary Tract: No adrenal nodule bilaterally. Bilateral perinephric stranding nonspecific. No nephrolithiasis and no hydronephrosis. Interval increase in size of an exophytic 6.3 cm (from 2.2 cm) left renal lesion with a density of 20 Hounsfield units. Slight interval increase in size of a smaller fluid density lesion measuring up to 4 cm. Subcentimeter hypodensities too small to characterize. There is a 1 cm exophytic lesion along the inferior pole of the right kidney with a density of simple fluid. There is interval increase in size an exophytic inferior right renal pole 5.3 cm lesion with a density of 22 Hounsfield units. Subcentimeter hyperdensity along the superior pole of the right kidney (6:90) too small to characterize. No ureterolithiasis or hydroureter. The urinary bladder is unremarkable. Stomach/Bowel: Surgical changes related to Roux-en-Y gastric bypass. Stomach is within normal limits. Several loops of dilated small bowel along the anastomotic sutures in the mid abdomen with associated fecalized material. The caliber of the small bowel measures up to 6.6 cm (3:36) as well as 6.4 cm (3:45) no small bowel wall thickening. No pneumatosis. No  evidence of large bowel wall thickening or dilatation. Appendix appears normal. Vascular/Lymphatic: No abdominal aorta or iliac aneurysm. Mild atherosclerotic plaque of the aorta and its branches. No abdominal, pelvic, or inguinal lymphadenopathy. Reproductive: Status post hysterectomy. No adnexal masses. Other: Interval increase in size of a lobulated partially calcified soft tissue density within the anterior upper abdomen measuring up to 4.9 x 6 cm (3:18) slowly enlarged but visualized at least as far back as 2010. No intraperitoneal free fluid. No intraperitoneal free gas. No organized fluid collection. Musculoskeletal: Ventral wall hernia repair with mesh with diastasis rectus and recurrence of the ventral wall hernia with a large abdominal defect. The hernia contains a loop of transverse colon as well as several loops of small bowel, including the loops involving the obstruction. No suspicious lytic or blastic osseous lesions. No acute displaced fracture. Multilevel degenerative changes of the spine. IMPRESSION: 1. Several loops of dilated small bowel along the jejunojejunosotmy and small bowel resection anastomotic sutures in the mid abdomen with associated luminal fecalized material in a patient status post Roux-en-Y gastric bypass and small bowel resection. Findings may represent early/partial small bowel obstruction possibly due to adhesions. No bowel perforation. No pneumatosis. 2. Ventral wall hernia repair with mesh with diastasis rectus and recurrence of the ventral wall hernia with a large abdominal defect. The hernia contains a loop of transverse colon as well as several loops of small bowel, including the loops involving the obstruction. The hernia is not the cause of the obstruction. 3. Interval increase in size of several indeterminate renal lesions. Recommend nonemergent MRI renal protocol for further evaluation. 4. Cholelithiasis with no findings of acute cholecystitis. 5. Interval increase in  size (compared to 2010 in 2016) of a lobulated partially calcified soft tissue density within the upper abdomen measuring up to 4.9 x 6 cm.  Finding may represent postsurgical changes. Electronically Signed: By: Iven Finn M.D. On: 01/02/2021 18:46     A/P: [name] is an 80 y.o. female with HTN, HLD DM, OSA, COPD, RYGB 2007 c/b leak vs hernia; incisional hernia repair x2 with mesh with subsequent recurrence; likely adhesive sbo  -I have reviewed her CT scan and additionally gone over it over the phone with Dr. Mckinley Jewel. This appears very similar to her CT scan from 07/2014. There is no apparent mesenteric swirling or evident internal hernia. Her last obstruction resolved nonoperatively and had same appearance on CT. -Agree with admission -NPO, MIVF -SBO protocol - given her preferences of no NG and no active emesis, will hold off for now. Her Iohexol contrast allergy was listed 05/2008. She is not aware of any contrast allergy. She has since had PO contrast as of 2016 without any apparent issue. -Ok for dvt ppx -We will follow with you - appreciate medicine's assistance in her care  Nadeen Landau, MD [facility] Use [email or web address] to contact on call provider

## 2021-01-02 NOTE — ED Provider Notes (Signed)
Got a call from radiology regarding this patient's CT scan.  Concern for potential early bowel obstruction however very difficult for theology to read.  They said further evidence is needed a CT with oral contrast is best.  Concern for bowel obstruction is high given the patient's complaint     Margarita Mail, PA-C 01/02/21 [place], [place], DO 01/02/21 2207

## 2021-01-03 ENCOUNTER — Inpatient Hospital Stay (HOSPITAL_COMMUNITY): Payer: Medicare Other

## 2021-01-03 ENCOUNTER — Other Ambulatory Visit: Payer: Self-pay

## 2021-01-03 DIAGNOSIS — I10 Essential (primary) hypertension: Secondary | ICD-10-CM | POA: Diagnosis not present

## 2021-01-03 DIAGNOSIS — E1121 Type 2 diabetes mellitus with diabetic nephropathy: Secondary | ICD-10-CM

## 2021-01-03 DIAGNOSIS — K56609 Unspecified intestinal obstruction, unspecified as to partial versus complete obstruction: Secondary | ICD-10-CM | POA: Diagnosis not present

## 2021-01-03 DIAGNOSIS — K432 Incisional hernia without obstruction or gangrene: Secondary | ICD-10-CM

## 2021-01-03 DIAGNOSIS — Z7952 Long term (current) use of systemic steroids: Secondary | ICD-10-CM

## 2021-01-03 LAB — BASIC METABOLIC PANEL
Anion gap: 5 (ref 5–15)
BUN: 12 mg/dL (ref 8–23)
CO2: 30 mmol/L (ref 22–32)
Calcium: 8.7 mg/dL — ABNORMAL LOW (ref 8.9–10.3)
Chloride: 106 mmol/L (ref 98–111)
Creatinine, Ser: 0.79 mg/dL (ref 0.44–1.00)
GFR, Estimated: >60 mL/min (ref >60)
Glucose, Bld: 63 mg/dL — ABNORMAL LOW (ref 70–99)
Potassium: 3.4 mmol/L — ABNORMAL LOW (ref 3.5–5.1)
Sodium: 141 mmol/L (ref 135–145)

## 2021-01-03 LAB — CBC
HCT: 38.9 % (ref 36.0–46.0)
Hemoglobin: 12 g/dL (ref 12.0–15.0)
MCH: 29.1 pg (ref 26.0–34.0)
MCHC: 30.8 g/dL (ref 30.0–36.0)
MCV: 94.4 fL (ref 80.0–100.0)
Platelets: 268 10*3/uL (ref 150–400)
RBC: 4.12 MIL/uL (ref 3.87–5.11)
RDW: 14.1 % (ref 11.5–15.5)
WBC: 8 10*3/uL (ref 4.0–10.5)
nRBC: 0 % (ref 0.0–0.2)

## 2021-01-03 LAB — GLUCOSE, CAPILLARY
Glucose-Capillary: 82 mg/dL (ref 70–99)
Glucose-Capillary: 219 mg/dL — ABNORMAL HIGH (ref 70–99)
Glucose-Capillary: 245 mg/dL — ABNORMAL HIGH (ref 70–99)
Glucose-Capillary: 436 mg/dL — ABNORMAL HIGH (ref 70–99)

## 2021-01-03 LAB — CBG MONITORING, ED
Glucose-Capillary: 55 mg/dL — ABNORMAL LOW (ref 70–99)
Glucose-Capillary: 83 mg/dL (ref 70–99)

## 2021-01-03 LAB — HEMOGLOBIN A1C
Hgb A1c MFr Bld: 6.6 % — ABNORMAL HIGH (ref 4.8–5.6)
Mean Plasma Glucose: 142.72 mg/dL

## 2021-01-03 MED ORDER — FAMOTIDINE IN NACL 20-0.9 MG/50ML-% IV SOLN
20.0000 mg | INTRAVENOUS | Status: AC
  Administered 2021-01-03: 20 mg via INTRAVENOUS
  Filled 2021-01-03: qty 50

## 2021-01-03 MED ORDER — INSULIN GLARGINE-YFGN 100 UNIT/ML ~~LOC~~ SOLN
10.0000 [IU] | Freq: Every day | SUBCUTANEOUS | Status: DC
  Administered 2021-01-03 – 2021-01-06 (×4): 10 [IU] via SUBCUTANEOUS
  Filled 2021-01-03 (×5): qty 0.1

## 2021-01-03 MED ORDER — INSULIN ASPART 100 UNIT/ML IJ SOLN
9.0000 [IU] | Freq: Once | INTRAMUSCULAR | Status: AC
  Administered 2021-01-03: 9 [IU] via SUBCUTANEOUS

## 2021-01-03 MED ORDER — RACEPINEPHRINE HCL 2.25 % IN NEBU
0.5000 mL | INHALATION_SOLUTION | Freq: Once | RESPIRATORY_TRACT | Status: DC
  Filled 2021-01-03: qty 0.5

## 2021-01-03 MED ORDER — ALBUTEROL SULFATE (2.5 MG/3ML) 0.083% IN NEBU
INHALATION_SOLUTION | RESPIRATORY_TRACT | Status: AC
  Filled 2021-01-03: qty 3

## 2021-01-03 MED ORDER — INSULIN ASPART 100 UNIT/ML IJ SOLN
0.0000 [IU] | Freq: Three times a day (TID) | INTRAMUSCULAR | Status: DC
  Administered 2021-01-03: 3 [IU] via SUBCUTANEOUS
  Administered 2021-01-04: 9 [IU] via SUBCUTANEOUS
  Administered 2021-01-04 – 2021-01-05 (×3): 5 [IU] via SUBCUTANEOUS
  Administered 2021-01-05: 3 [IU] via SUBCUTANEOUS
  Administered 2021-01-05: 2 [IU] via SUBCUTANEOUS
  Administered 2021-01-06: 5 [IU] via SUBCUTANEOUS
  Administered 2021-01-06: 3 [IU] via SUBCUTANEOUS
  Administered 2021-01-06: 5 [IU] via SUBCUTANEOUS

## 2021-01-03 MED ORDER — LORAZEPAM 2 MG/ML IJ SOLN
INTRAMUSCULAR | Status: AC
  Administered 2021-01-03: 1 mg
  Filled 2021-01-03: qty 1

## 2021-01-03 MED ORDER — LATANOPROST 0.005 % OP SOLN
1.0000 [drp] | Freq: Every day | OPHTHALMIC | Status: DC
  Administered 2021-01-03 – 2021-01-09 (×7): 1 [drp] via OPHTHALMIC
  Filled 2021-01-03 (×2): qty 2.5

## 2021-01-03 MED ORDER — INSULIN NPH (HUMAN) (ISOPHANE) 100 UNIT/ML ~~LOC~~ SUSP
15.0000 [IU] | Freq: Two times a day (BID) | SUBCUTANEOUS | Status: DC
  Administered 2021-01-03: 15 [IU] via SUBCUTANEOUS
  Filled 2021-01-03: qty 10

## 2021-01-03 MED ORDER — LABETALOL HCL 5 MG/ML IV SOLN: 5.0000 mg | INTRAVENOUS | Status: DC | PRN

## 2021-01-03 MED ORDER — METHYLPREDNISOLONE SODIUM SUCC 125 MG IJ SOLR: 125.0000 mg | Freq: Once | INTRAMUSCULAR | Status: AC

## 2021-01-03 MED ORDER — HYDRALAZINE HCL 20 MG/ML IJ SOLN
5.0000 mg | INTRAMUSCULAR | Status: DC | PRN
  Administered 2021-01-06 – 2021-01-09 (×3): 5 mg via INTRAVENOUS
  Filled 2021-01-03 (×3): qty 1

## 2021-01-03 MED ORDER — LORAZEPAM 2 MG/ML IJ SOLN
0.5000 mg | Freq: Four times a day (QID) | INTRAMUSCULAR | Status: DC | PRN
Start: 2021-01-03 — End: 2021-01-10

## 2021-01-03 MED ORDER — METHYLPREDNISOLONE SODIUM SUCC 125 MG IJ SOLR
INTRAMUSCULAR | Status: AC
  Administered 2021-01-03: 125 mg
  Filled 2021-01-03: qty 2

## 2021-01-03 MED ORDER — PANTOPRAZOLE SODIUM 40 MG PO TBEC
40.0000 mg | DELAYED_RELEASE_TABLET | Freq: Two times a day (BID) | ORAL | Status: DC
  Administered 2021-01-03 – 2021-01-10 (×14): 40 mg via ORAL
  Filled 2021-01-03 (×14): qty 1

## 2021-01-03 MED ORDER — LORAZEPAM 2 MG/ML IJ SOLN
1.0000 mg | Freq: Once | INTRAMUSCULAR | Status: AC
  Administered 2021-01-03: 1 mg via INTRAVENOUS

## 2021-01-03 MED ORDER — EPINEPHRINE 0.3 MG/0.3ML IJ SOAJ
0.3000 mg | Freq: Once | INTRAMUSCULAR | Status: DC
  Filled 2021-01-03: qty 0.6

## 2021-01-03 MED ORDER — EPINEPHRINE 1 MG/10ML IJ SOSY
PREFILLED_SYRINGE | INTRAMUSCULAR | Status: AC
  Filled 2021-01-03: qty 10

## 2021-01-03 NOTE — Plan of Care (Signed)

## 2021-01-03 NOTE — ED Notes (Signed)
Pt's glucose 55. Pt given apple juice.

## 2021-01-03 NOTE — Progress Notes (Signed)
PROGRESS NOTE    [name]  [id number] DOB: Feb 05, 1941 DOA: 01/02/2021 PCP: Wardell Honour, MD   Chief Complaint  Patient presents with   Hypoglycemia   Constipation    Brief Narrative:   This is a no charge note as patient was seen and admitted earlier today by Dr. Alcario Drought, chart, imaging and labs were reviewed, patient was seen and examined.  HPI: [name] is a 80 y.o. female with medical history significant of HTN, HLD, DM2, prior Roux-en-y gastric bypass in 2007, 2 ventral hernia repairs with recurrence after each.   2 prior admissions for nausea, abdominal fullness, ? SBO, most recently 2015.   Pt presents to ED with c/o 7 day h/o "constipation".  Normally pt has BM daily but hasnt had BM in past 7 days.  Tried taking dulcolax without relief of symptoms, not passing gas, has had nausea, 1 episode of vomiting.   No CP, fevers, chills, dysuria, SOB.    ED Course: CT concerning for early or partial SBO.  Gen surg consulted and hospitalist asked to admit.    Assessment & Plan:   Principal Problem:   SBO (small bowel obstruction) (HCC) Active Problems:   Controlled diabetes mellitus with nephropathy ([place])   Essential hypertension, benign   Recurrent ventral incisional hernia   Current chronic use of systemic steroids   SBO - Gen surg thinks due to adhesions, and per general surgery, patient appears to be having bowel movements overnight, abdomen exam looks benign today, nondistended, with good bowel movements, appears to be resolving, she is started on full liquid diet, management per general surgery  DM2  - Hold glipizide -Hold her NPH, and keep on Lantus, will add insulin sliding scale.   HTN - Continue home BP meds But will hold lasix + HCTZ PRN labetalol if needed  Current chronic use of systemic steroids - Cont PO prednisone    DVT prophylaxis: Lovenox Code Status: DNR Family Communication: None at bedside Disposition:   Status is:  Inpatient  Remains inpatient appropriate because:IV treatments appropriate due to intensity of illness or inability to take PO  Dispo: The patient is from: Home              Anticipated d/c is to: Home              Patient currently is not medically stable to d/c.   Difficult to place patient No       Consultants:  General surgery  Subjective:  Denies any abdominal pain, nausea or vomiting, reports she had bowel movement overnight  Objective: Vitals:   01/03/21 0915 01/03/21 0930 01/03/21 1015 01/03/21 1126  BP: (!) 156/69 (!) 158/72 (!) 131/51 119/68  Pulse: (!) 49 (!) 50 (!) 47 (!) 49  Resp: '17 17 17   '$ Temp:    98.5 F (36.9 C)  TempSrc:    Oral  SpO2: 93% 92% 93% 98%  Weight:      Height:       No intake or output data in the 24 hours ending 01/03/21 1412 Filed Weights   01/02/21 1727  Weight: 90.7 kg    Examination:  Awake Alert, Oriented X 3, No new F.N deficits, Normal affect Symmetrical Chest wall movement, Good air movement bilaterally, CTAB RRR,No Gallops,Rubs or new Murmurs, No Parasternal Heave Abdomen with bowel sounds present, nondistended, nontender, she has large ventral hernia +ve B.Sounds, No Cyanosis, Clubbing or edema, No new Rash or bruise  Data Reviewed: I have personally reviewed following labs and imaging studies  CBC: Recent Labs  Lab 01/02/21 1733 01/02/21 1750 01/03/21 0519  WBC 13.5*  --  8.0  NEUTROABS 11.1*  --   --   HGB 13.4 13.9 12.0  HCT 43.1 41.0 38.9  MCV 94.5  --  94.4  PLT 333  --  [id number]    Basic Metabolic Panel: Recent Labs  Lab 01/02/21 1733 01/02/21 1750 01/03/21 0519  NA 141 141 141  K 3.9 3.8 3.4*  CL 105 103 106  CO2 29  --  30  GLUCOSE 105* 107* 63*  BUN '14 15 12  '$ CREATININE 0.96 1.00 0.79  CALCIUM 9.4  --  8.7*    GFR: Estimated Creatinine Clearance: 56.7 mL/min (by C-G formula based on SCr of 0.79 mg/dL).  Liver Function Tests: Recent Labs  Lab 01/02/21 1733  AST 19  ALT 19   ALKPHOS 76  BILITOT 1.0  PROT 6.3*  ALBUMIN 3.4*    CBG: Recent Labs  Lab 01/02/21 2142 01/02/21 2355 01/03/21 0544 01/03/21 0743 01/03/21 1144  GLUCAP 108* 197* 55* 83 82     No results found for this or any previous visit (from the past 240 hour(s)).       Radiology Studies: CT Abdomen Pelvis Wo Contrast  Addendum Date: 01/02/2021   ADDENDUM REPORT: 01/02/2021 21:21 ADDENDUM: No definite internal hernia identified. Consider PO contrast administration with x-ray small-bowel follow-through to evaluate for passage of PO contrast past the small bowel into the large bowel. These results were called by telephone at the time of interpretation on 01/02/2021 at 9:18 pm to provider Dr. Dema Severin from surgery, who verbally acknowledged these results. Discussed similar findings and follow-up imaging with PA Margarita Mail and ED Attending earlier. Electronically Signed   By: Iven Finn M.D.   On: 01/02/2021 21:21   Addendum Date: 01/02/2021   ADDENDUM REPORT: 01/02/2021 18:51 ADDENDUM: These results were called by telephone at the time of interpretation on 01/02/2021 at 6:51 pm to provider [name] , who verbally acknowledged these results. Electronically Signed   By: Iven Finn M.D.   On: 01/02/2021 18:51   Result Date: 01/02/2021 CLINICAL DATA:  Nausea and vomiting. EXAM: CT ABDOMEN AND PELVIS WITHOUT CONTRAST TECHNIQUE: Multidetector CT imaging of the abdomen and pelvis was performed following the standard protocol without IV contrast. COMPARISON:  CT abdomen pelvis 08/19/2014, CT abdomen pelvis 01/24/2009, CT abdomen pelvis 07/24/2004 FINDINGS: Lower chest: No acute abnormality. Hepatobiliary: No focal liver abnormality. Calcified gallstones within the gallbladder lumen. No associated gallbladder wall thickening or pericholecystic fluid. No biliary dilatation. Pancreas: No focal lesion. Normal pancreatic contour. No surrounding inflammatory changes. No main pancreatic ductal  dilatation. Spleen: Normal in size without focal abnormality. Adrenals/Urinary Tract: No adrenal nodule bilaterally. Bilateral perinephric stranding nonspecific. No nephrolithiasis and no hydronephrosis. Interval increase in size of an exophytic 6.3 cm (from 2.2 cm) left renal lesion with a density of 20 Hounsfield units. Slight interval increase in size of a smaller fluid density lesion measuring up to 4 cm. Subcentimeter hypodensities too small to characterize. There is a 1 cm exophytic lesion along the inferior pole of the right kidney with a density of simple fluid. There is interval increase in size an exophytic inferior right renal pole 5.3 cm lesion with a density of 22 Hounsfield units. Subcentimeter hyperdensity along the superior pole of the right kidney (6:90) too small to characterize. No ureterolithiasis or hydroureter. The urinary bladder is unremarkable.  Stomach/Bowel: Surgical changes related to Roux-en-Y gastric bypass. Stomach is within normal limits. Several loops of dilated small bowel along the anastomotic sutures in the mid abdomen with associated fecalized material. The caliber of the small bowel measures up to 6.6 cm (3:36) as well as 6.4 cm (3:45) no small bowel wall thickening. No pneumatosis. No evidence of large bowel wall thickening or dilatation. Appendix appears normal. Vascular/Lymphatic: No abdominal aorta or iliac aneurysm. Mild atherosclerotic plaque of the aorta and its branches. No abdominal, pelvic, or inguinal lymphadenopathy. Reproductive: Status post hysterectomy. No adnexal masses. Other: Interval increase in size of a lobulated partially calcified soft tissue density within the anterior upper abdomen measuring up to 4.9 x 6 cm (3:18) slowly enlarged but visualized at least as far back as 2010. No intraperitoneal free fluid. No intraperitoneal free gas. No organized fluid collection. Musculoskeletal: Ventral wall hernia repair with mesh with diastasis rectus and recurrence  of the ventral wall hernia with a large abdominal defect. The hernia contains a loop of transverse colon as well as several loops of small bowel, including the loops involving the obstruction. No suspicious lytic or blastic osseous lesions. No acute displaced fracture. Multilevel degenerative changes of the spine. IMPRESSION: 1. Several loops of dilated small bowel along the jejunojejunosotmy and small bowel resection anastomotic sutures in the mid abdomen with associated luminal fecalized material in a patient status post Roux-en-Y gastric bypass and small bowel resection. Findings may represent early/partial small bowel obstruction possibly due to adhesions. No bowel perforation. No pneumatosis. 2. Ventral wall hernia repair with mesh with diastasis rectus and recurrence of the ventral wall hernia with a large abdominal defect. The hernia contains a loop of transverse colon as well as several loops of small bowel, including the loops involving the obstruction. The hernia is not the cause of the obstruction. 3. Interval increase in size of several indeterminate renal lesions. Recommend nonemergent MRI renal protocol for further evaluation. 4. Cholelithiasis with no findings of acute cholecystitis. 5. Interval increase in size (compared to 2010 in 2016) of a lobulated partially calcified soft tissue density within the upper abdomen measuring up to 4.9 x 6 cm. Finding may represent postsurgical changes. Electronically Signed: By: Iven Finn M.D. On: 01/02/2021 18:46   DG Abd Portable 1V-Small Bowel Obstruction Protocol-initial, 8 hr delay  Result Date: 01/03/2021 CLINICAL DATA:  8 hour delay.  Small bowel obstruction. EXAM: PORTABLE ABDOMEN - 1 VIEW COMPARISON:  CT from yesterday FINDINGS: Enteric contrast has reached the distal colon. There are still dilated small bowel loops in the central abdomen. No concerning mass effect or gas collection. Osteopenia and atherosclerosis. IMPRESSION: Partial small bowel  obstruction. Enteric contrast has reached the nondilated colon. Electronically Signed   By: Monte Fantasia M.D.   On: 01/03/2021 07:10        Scheduled Meds:  amLODipine  2.5 mg Oral Daily   citalopram  40 mg Oral Daily   enoxaparin (LOVENOX) injection  40 mg Subcutaneous Q24H   insulin aspart  0-9 Units Subcutaneous Q4H   insulin glargine-yfgn  10 Units Subcutaneous Daily   isosorbide mononitrate  30 mg Oral Daily   latanoprost  1 drop Both Eyes QHS   losartan  50 mg Oral Daily   pantoprazole  40 mg Oral Daily   predniSONE  5 mg Oral Q breakfast   Continuous Infusions:  lactated ringers 100 mL/hr at 01/03/21 1132     LOS: 1 day      Phillips Climes, MD  [facility]   To contact the attending provider between 7A-7P or the covering provider during after hours 7P-7A, please log into the web site [email or web address] and access using universal [place] password for that web site. If you do not have the password, please call the hospital operator.  01/03/2021, 2:12 PM

## 2021-01-03 NOTE — ED Notes (Signed)
Ambulated to bathroom with steady gait with contact assistance

## 2021-01-03 NOTE — Progress Notes (Signed)
Progress Note     Subjective: Large BM last night and another small BM this am. No further nausea, emesis. Abdominal pain improved   Objective: Vital signs in last 24 hours: Temp:  [98.9 F (37.2 C)] 98.9 F (37.2 C) (08/04 1717) Pulse Rate:  [49-94] 49 (08/05 0600) Resp:  [14-19] 18 (08/05 0600) BP: (148-200)/(63-136) 173/90 (08/05 0600) SpO2:  [93 %-97 %] 95 % (08/05 0600) Weight:  [90.7 kg] 90.7 kg (08/04 1727)    Intake/Output from previous day: No intake/output data recorded. Intake/Output this shift: No intake/output data recorded.  PE: General: pleasant, WD, female who is laying in bed in NAD HEENT: head is normocephalic, atraumatic.  Mouth is pink and moist Heart: regular, rate, and rhythm. Palpable radial pulses bilaterally Lungs: Respiratory effort nonlabored Abd: soft, NT, ND, +BS - large ventral hernia soft without incarceration MSK: all 4 extremities are symmetrical with no cyanosis, clubbing, or edema. No calf TTP bilaterally Skin: warm and dry with no masses, lesions, or rashes Psych: A&Ox3 with an appropriate affect.    Lab Results:  Recent Labs    01/02/21 1733 01/02/21 1750 01/03/21 0519  WBC 13.5*  --  8.0  HGB 13.4 13.9 12.0  HCT 43.1 41.0 38.9  PLT 333  --  268   BMET Recent Labs    01/02/21 1733 01/02/21 1750 01/03/21 0519  NA 141 141 141  K 3.9 3.8 3.4*  CL 105 103 106  CO2 29  --  30  GLUCOSE 105* 107* 63*  BUN '14 15 12  '$ CREATININE 0.96 1.00 0.79  CALCIUM 9.4  --  8.7*   PT/INR No results for input(s): LABPROT, INR in the last 72 hours. CMP     Component Value Date/Time   NA 141 01/03/2021 0519   NA 140 09/27/2017 1511   K 3.4 (L) 01/03/2021 0519   CL 106 01/03/2021 0519   CO2 30 01/03/2021 0519   GLUCOSE 63 (L) 01/03/2021 0519   BUN 12 01/03/2021 0519   BUN 17 09/27/2017 1511   CREATININE 0.79 01/03/2021 0519   CREATININE 0.85 10/08/2020 0855   CALCIUM 8.7 (L) 01/03/2021 0519   PROT 6.3 (L) 01/02/2021 1733    PROT 6.2 08/07/2015 0854   ALBUMIN 3.4 (L) 01/02/2021 1733   ALBUMIN 3.7 08/07/2015 0854   AST 19 01/02/2021 1733   ALT 19 01/02/2021 1733   ALKPHOS 76 01/02/2021 1733   BILITOT 1.0 01/02/2021 1733   BILITOT 0.5 08/07/2015 0854   GFRNONAA >60 01/03/2021 0519   GFRNONAA 63 06/06/2020 1001   GFRAA 73 06/06/2020 1001   Lipase     Component Value Date/Time   LIPASE 27 01/02/2021 1733       Studies/Results: CT Abdomen Pelvis Wo Contrast  Addendum Date: 01/02/2021   ADDENDUM REPORT: 01/02/2021 21:21 ADDENDUM: No definite internal hernia identified. Consider PO contrast administration with x-ray small-bowel follow-through to evaluate for passage of PO contrast past the small bowel into the large bowel. These results were called by telephone at the time of interpretation on 01/02/2021 at 9:18 pm to provider Dr. Dema Severin from surgery, who verbally acknowledged these results. Discussed similar findings and follow-up imaging with PA Margarita Mail and ED Attending earlier. Electronically Signed   By: Iven Finn M.D.   On: 01/02/2021 21:21   Addendum Date: 01/02/2021   ADDENDUM REPORT: 01/02/2021 18:51 ADDENDUM: These results were called by telephone at the time of interpretation on 01/02/2021 at 6:51 pm to provider [name] ,  who verbally acknowledged these results. Electronically Signed   By: Iven Finn M.D.   On: 01/02/2021 18:51   Result Date: 01/02/2021 CLINICAL DATA:  Nausea and vomiting. EXAM: CT ABDOMEN AND PELVIS WITHOUT CONTRAST TECHNIQUE: Multidetector CT imaging of the abdomen and pelvis was performed following the standard protocol without IV contrast. COMPARISON:  CT abdomen pelvis 08/19/2014, CT abdomen pelvis 01/24/2009, CT abdomen pelvis 07/24/2004 FINDINGS: Lower chest: No acute abnormality. Hepatobiliary: No focal liver abnormality. Calcified gallstones within the gallbladder lumen. No associated gallbladder wall thickening or pericholecystic fluid. No biliary dilatation.  Pancreas: No focal lesion. Normal pancreatic contour. No surrounding inflammatory changes. No main pancreatic ductal dilatation. Spleen: Normal in size without focal abnormality. Adrenals/Urinary Tract: No adrenal nodule bilaterally. Bilateral perinephric stranding nonspecific. No nephrolithiasis and no hydronephrosis. Interval increase in size of an exophytic 6.3 cm (from 2.2 cm) left renal lesion with a density of 20 Hounsfield units. Slight interval increase in size of a smaller fluid density lesion measuring up to 4 cm. Subcentimeter hypodensities too small to characterize. There is a 1 cm exophytic lesion along the inferior pole of the right kidney with a density of simple fluid. There is interval increase in size an exophytic inferior right renal pole 5.3 cm lesion with a density of 22 Hounsfield units. Subcentimeter hyperdensity along the superior pole of the right kidney (6:90) too small to characterize. No ureterolithiasis or hydroureter. The urinary bladder is unremarkable. Stomach/Bowel: Surgical changes related to Roux-en-Y gastric bypass. Stomach is within normal limits. Several loops of dilated small bowel along the anastomotic sutures in the mid abdomen with associated fecalized material. The caliber of the small bowel measures up to 6.6 cm (3:36) as well as 6.4 cm (3:45) no small bowel wall thickening. No pneumatosis. No evidence of large bowel wall thickening or dilatation. Appendix appears normal. Vascular/Lymphatic: No abdominal aorta or iliac aneurysm. Mild atherosclerotic plaque of the aorta and its branches. No abdominal, pelvic, or inguinal lymphadenopathy. Reproductive: Status post hysterectomy. No adnexal masses. Other: Interval increase in size of a lobulated partially calcified soft tissue density within the anterior upper abdomen measuring up to 4.9 x 6 cm (3:18) slowly enlarged but visualized at least as far back as 2010. No intraperitoneal free fluid. No intraperitoneal free gas. No  organized fluid collection. Musculoskeletal: Ventral wall hernia repair with mesh with diastasis rectus and recurrence of the ventral wall hernia with a large abdominal defect. The hernia contains a loop of transverse colon as well as several loops of small bowel, including the loops involving the obstruction. No suspicious lytic or blastic osseous lesions. No acute displaced fracture. Multilevel degenerative changes of the spine. IMPRESSION: 1. Several loops of dilated small bowel along the jejunojejunosotmy and small bowel resection anastomotic sutures in the mid abdomen with associated luminal fecalized material in a patient status post Roux-en-Y gastric bypass and small bowel resection. Findings may represent early/partial small bowel obstruction possibly due to adhesions. No bowel perforation. No pneumatosis. 2. Ventral wall hernia repair with mesh with diastasis rectus and recurrence of the ventral wall hernia with a large abdominal defect. The hernia contains a loop of transverse colon as well as several loops of small bowel, including the loops involving the obstruction. The hernia is not the cause of the obstruction. 3. Interval increase in size of several indeterminate renal lesions. Recommend nonemergent MRI renal protocol for further evaluation. 4. Cholelithiasis with no findings of acute cholecystitis. 5. Interval increase in size (compared to 2010 in 2016) of a  lobulated partially calcified soft tissue density within the upper abdomen measuring up to 4.9 x 6 cm. Finding may represent postsurgical changes. Electronically Signed: By: Iven Finn M.D. On: 01/02/2021 18:46   DG Abd Portable 1V-Small Bowel Obstruction Protocol-initial, 8 hr delay  Result Date: 01/03/2021 CLINICAL DATA:  8 hour delay.  Small bowel obstruction. EXAM: PORTABLE ABDOMEN - 1 VIEW COMPARISON:  CT from yesterday FINDINGS: Enteric contrast has reached the distal colon. There are still dilated small bowel loops in the central  abdomen. No concerning mass effect or gas collection. Osteopenia and atherosclerosis. IMPRESSION: Partial small bowel obstruction. Enteric contrast has reached the nondilated colon. Electronically Signed   By: Monte Fantasia M.D.   On: 01/03/2021 07:10    Anti-infectives: Anti-infectives (From admission, onward)    None        Assessment/Plan  80 y.o. female likely adhesive sbo  - h/o RYGB 2007 c/b leak vs hernia; incisional hernia repair x2 with mesh with subsequent recurrence - SBO protocol - am xray shows partial small bowel obstruction with contrast in the colon - having BM - start full liquids  FEN: Full liquid diet ID: none VTE: lovenox  DM HTN   LOS: 1 day    Winferd Humphrey, [facility] 01/03/2021, 7:31 AM Please see Amion for pager number during day hours 7:00am-4:30pm

## 2021-01-03 NOTE — ED Notes (Signed)
MS Lunch Ordered

## 2021-01-03 NOTE — Progress Notes (Signed)
Patient complaining of being sick, arrived in the rrom, patient sitting at the edge of the bed, complaining that she cant breath, O2 saturation 97-100 % VSS but patient continue to say she feels like her throat is closing up, Rapid made aware. MD called.

## 2021-01-03 NOTE — Significant Event (Signed)
Rapid Response Event Note   Reason for Call :  Difficulty breathing, "throat swelling"  Initial Focused Assessment:  Patient is sitting up on the side of the bed.  She is mildly anxious and is very striderous.  Lung sounds are diminished She is trying to clear her throat has increased saliva. Per patient she has a history of her throat swelling and a long allergy list.  Per patient the last time this happened was a "long" time ago.    BP 149/84  HR 92  RR 24  O2 sat 97% on [place]  Dr Waldron Labs at bedside to assess patient  Interventions:  125 mg Solumedrol 1 Epi Pen  She did not receive ordered racemic Epi because she refused medication.  Dr Lake Bells and Laurey Arrow NP at bedside to assess patient. Breathing exercises, flutter valve, therapeutic conversation. '1mg'$  Ativan given IV for anxiety and vocal cord dysfunction.   Plan of Care:  See PCCM note for details    Event Summary:   MD Notified: Dr Waldron Labs Call Time: [place] Time: 1628 End Time: [id number]  Raliegh Ip, RN

## 2021-01-03 NOTE — Progress Notes (Addendum)
Patient with an episode of respiratory distress this afternoon, with significant upper  air away stridor and increased work of breathing, she received steroids, racemic epi and EpiPen,with  little help, PCCM at bedside to evaluate after this management, they evaluated the patient, findings most likely in the setting of vocal cord dysfunction, patient currently back to baseline after receiving Ativan, with  significant improvement of her symptoms, recommendation to increase with PPI to reduce reflux, and upon this events in the future, would  try patient , and will likely need Ativan as well. Phillips Climes MD

## 2021-01-03 NOTE — Consult Note (Signed)
NAME:  [name], MRN:  [id number], DOB:  [birth year], LOS: 1 ADMISSION DATE:  01/02/2021, CONSULTATION DATE:  8/5 REFERRING MD:  Elgergawy, CHIEF COMPLAINT:  acute respiratory distress "stridor"    History of Present Illness:  This is a 80 year old female with complex medical history as mentioned below.  Admitted to the internal medicine service on 8/5 with chief complaint of constipation, nausea, and abdominal fullness ultimately diagnosed with a small bowel obstruction She has been seen by the and admitted for medical therapy.  Surgical services, the small bowel protocol was initiated.  She was slowly progressing over the course of the last 24 hours.  On 8/5 patient had initially been progressing.  Then rather suddenly in the afternoon on 8/5 she awoke from a nap acutely short of breath.  This rapidly escalated to audible inspiratory wheezing, raspy voice, and worsening respiratory distress.  The rapid response team was summoned, prior to critical care evaluation she was treated with systemic steroids, racemic epinephrine, and an epinephrine pen, she was quite tremulous and anxious by the time we arrived.  Critical care was asked to see her emergently in the setting of acute respiratory distress, and concern for upper airway obstruction/stridor.  Pertinent  Medical History  Hypertension, hyperlipidemia, diabetes type 2, obesity, prior gastric bypass, ventral hernia.  Also seen by [place] in the past for upper airway cough syndrome.  History of gastroesophageal reflux disease.  Multiple medical allergies.  History of sleep apnea.  History of depression.  History of dyskinesia of esophagus.  Significant Hospital Events: Including procedures, antibiotic start and stop dates in addition to other pertinent events   8/4: Admitted for small bowel obstruction small bowel protocol initiated 8/5: Critical care consulted urgently at bedside for upper airway wheezing and acute respiratory distress  Interim History  / Subjective:  Feels better after bedside intervention, inspiratory wheezing improving, work of breathing improving.  Objective   Blood pressure (Abnormal) 149/84, pulse 92, temperature 98.5 F (36.9 C), temperature source Oral, resp. rate 17, height '5\' 2"'$  (1.575 m), weight 90.7 kg, SpO2 97 %.       No intake or output data in the 24 hours ending 01/03/21 1726 Filed Weights   01/02/21 1727  Weight: 90.7 kg    Examination: General: 80 year old white female currently sitting up at the side of the bed work of breathing slowly improving.  Still exhibits vocal hoarseness and upper airway inspiratory wheezing this is improving as well HENT: Mucous membranes are moist no jugular venous distention phonation very hoarse some inspiratory wheezing noted this is quite forced Lungs: Clear diminished bases some accessory use Cardiovascular: Regular rate Abdomen: Large ventral palpable abdominal nonreducible hernia Extremities: Brisk capillary refill no edema Neuro: Awake oriented tremulous anxious GU: Due to void  Resolved Hospital Problem list     Assessment & Plan:   SBO DM type 2 HTN Acute vocal cord dysfxxn Chronic steroid dependence  Ventral hernia  Pulm problem list:  Acute respiratory distress secondary to acute exacerbation of vocal cord dysfunction. She has a known history of upper airway cough, dating back to 2016.  Now coming in with small bowel obstruction, essentially  setting her up for the perfect scenario with reflux and anxiety likely the contributing factors Plan BID PPI Reflux precautions (ensure she sleeps w. HOB elevated) Would avoid dry inhaled powders Treat anxiety Encourage vocal exercises (counting backwards, humming, flutter)      Best Practice (right click and "Reselect all SmartList Selections" daily)  Per primary    Labs   CBC: Recent Labs  Lab 01/02/21 1733 01/02/21 1750 01/03/21 0519  WBC 13.5*  --  8.0  NEUTROABS 11.1*  --   --   HGB  13.4 13.9 12.0  HCT 43.1 41.0 38.9  MCV 94.5  --  94.4  PLT 333  --  [id number]    Basic Metabolic Panel: Recent Labs  Lab 01/02/21 1733 01/02/21 1750 01/03/21 0519  NA 141 141 141  K 3.9 3.8 3.4*  CL 105 103 106  CO2 29  --  30  GLUCOSE 105* 107* 63*  BUN '14 15 12  '$ CREATININE 0.96 1.00 0.79  CALCIUM 9.4  --  8.7*   GFR: Estimated Creatinine Clearance: 56.7 mL/min (by C-G formula based on SCr of 0.79 mg/dL). Recent Labs  Lab 01/02/21 1733 01/03/21 0519  WBC 13.5* 8.0    Liver Function Tests: Recent Labs  Lab 01/02/21 1733  AST 19  ALT 19  ALKPHOS 76  BILITOT 1.0  PROT 6.3*  ALBUMIN 3.4*   Recent Labs  Lab 01/02/21 1733  LIPASE 27   No results for input(s): AMMONIA in the last 168 hours.  ABG    Component Value Date/Time   [facility]  01/25/2009 0925    7.399 PATIENT IDENTIFICATION ERROR. PLEASE DISREGARD RESULTS. ACCOUNT WILL BE CREDITED.   PCO2ART  01/25/2009 0925    37.1 PATIENT IDENTIFICATION ERROR. PLEASE DISREGARD RESULTS. ACCOUNT WILL BE CREDITED.   PO2ART (H) 01/25/2009 0925    106.0 PATIENT IDENTIFICATION ERROR. PLEASE DISREGARD RESULTS. ACCOUNT WILL BE CREDITED.   HCO3  01/25/2009 0925    23.9 PATIENT IDENTIFICATION ERROR. PLEASE DISREGARD RESULTS. ACCOUNT WILL BE CREDITED.   HCO3  01/25/2009 0925    22.9 PATIENT IDENTIFICATION ERROR. PLEASE DISREGARD RESULTS. ACCOUNT WILL BE CREDITED.   TCO2 29 01/02/2021 1750   ACIDBASEDEF  01/25/2009 0925    1.0 PATIENT IDENTIFICATION ERROR. PLEASE DISREGARD RESULTS. ACCOUNT WILL BE CREDITED.   ACIDBASEDEF  01/25/2009 0925    2.0 PATIENT IDENTIFICATION ERROR. PLEASE DISREGARD RESULTS. ACCOUNT WILL BE CREDITED.   O2SAT  01/25/2009 0925    80.0 PATIENT IDENTIFICATION ERROR. PLEASE DISREGARD RESULTS. ACCOUNT WILL BE CREDITED.   O2SAT  01/25/2009 0925    98.0 PATIENT IDENTIFICATION ERROR. PLEASE DISREGARD RESULTS. ACCOUNT WILL BE CREDITED.     Coagulation Profile: No results for input(s): INR, PROTIME in the last  168 hours.  Cardiac Enzymes: No results for input(s): CKTOTAL, CKMB, CKMBINDEX, TROPONINI in the last 168 hours.  HbA1C: Hemoglobin-A1c  Date/Time Value Ref Range Status  01/24/2009 04:15 AM 10.1 (H) 5.4 - 7.4 % Final   Hgb A1c MFr Bld  Date/Time Value Ref Range Status  01/03/2021 05:19 AM 6.6 (H) 4.8 - 5.6 % Final    Comment:    (NOTE) Pre diabetes:          5.7%-6.4%  Diabetes:              >6.4%  Glycemic control for   <7.0% adults with diabetes   10/08/2020 08:55 AM 6.4 (H) <5.7 % of total Hgb Final    Comment:    For someone without known diabetes, a hemoglobin  A1c value between 5.7% and 6.4% is consistent with prediabetes and should be confirmed with a  follow-up test. . For someone with known diabetes, a value <7% indicates that their diabetes is well controlled. A1c targets should be individualized based on duration of diabetes, age, comorbid conditions, and other considerations. . This  assay result is consistent with an increased risk of diabetes. . Currently, no consensus exists regarding use of hemoglobin A1c for diagnosis of diabetes for children. .     CBG: Recent Labs  Lab 01/03/21 0544 01/03/21 0743 01/03/21 1144 01/03/21 1505 01/03/21 1617  GLUCAP 55* 83 82 219* 245*    Review of Systems:   Review of Systems  Constitutional: Negative.   HENT:  Positive for congestion.   Eyes: Negative.   Respiratory:  Positive for cough, shortness of breath, wheezing and stridor.   Cardiovascular: Negative.   Gastrointestinal:  Positive for constipation and heartburn.  Genitourinary: Negative.   Musculoskeletal: Negative.   Skin: Negative.   Neurological: Negative.   Endo/Heme/Allergies: Negative.   Psychiatric/Behavioral: Negative.    Past Medical History:  She,  has a past medical history of Abdominal pain, unspecified site, Abnormality of gait, Apnea, Cardiomegaly, Chest pain, unspecified, Complication of anesthesia, Depressive disorder, not  elsewhere classified, Diabetes mellitus, Diabetic retinopathy ([place]), Dizziness and giddiness, Dyskinesia of esophagus, Edema, Extrinsic asthma, unspecified, Female stress incontinence, Gout, unspecified, High cholesterol, Hypertension, Lipoma of other skin and subcutaneous tissue, Lumbago, Memory loss, Migraine without aura, with intractable migraine, so stated, with status migrainosus, Mild cognitive impairment, so stated, Nonspecific (abnormal) findings on radiological and other examination of abdominal area, including retroperitoneum, Nonspecific abnormal results of liver function study, Obesity, unspecified, Obstructive chronic bronchitis with exacerbation ([place]), Other and unspecified hyperlipidemia, Other B-complex deficiencies, Other nonspecific abnormal serum enzyme levels, Other specified cardiac dysrhythmias(427.89), Other specified disease of sebaceous glands, Other symptoms involving cardiovascular system, Pain in joint, ankle and foot, Pain in joint, pelvic region and thigh, Pain in joint, shoulder region, Palpitations, Reflux esophagitis, Shortness of breath, Tension headache, Type I (juvenile type) diabetes mellitus without mention of complication, not stated as uncontrolled, Type I (juvenile type) diabetes mellitus without mention of complication, uncontrolled, Unspecified essential hypertension, Unspecified hypothyroidism, Unspecified vitamin D deficiency, and Ventral hernia, unspecified, without mention of obstruction or gangrene.   Surgical History:   Past Surgical History:  Procedure Laterality Date   ABDOMINAL HYSTERECTOMY     APPENDECTOMY     BREAST SURGERY     EXPLORATORY LAPAROTOMY W/ BOWEL RESECTION  07/25/2004   PROCEDURE:  Laparoscopy, open laparotomy, resection of jejunojejunostomy   INCISIONAL HERNIA REPAIR  03/19/2006   PROCEDURE:  Open ventral hernia repair with mesh.   LAPAROSCOPIC GASTRIC BYPASS  07/22/2004   PROCEDURE:  Laparoscopic Roux-en-Y gastric bypass, antecolic,  antegastric,   LAPAROSCOPIC INCISIONAL / UMBILICAL / VENTRAL HERNIA REPAIR  09/22/2005   PROCEDURE:  Laparoscopic ventral hernia repair with mesh.   LEFT HEART CATH AND CORONARY ANGIOGRAPHY N/A 11/20/2016   Procedure: Left Heart Cath and Coronary Angiography;  Surgeon: Sherren Mocha, MD;  Location: [facility];  Service: Cardiovascular;  Laterality: N/A;   LEFT HEART CATHETERIZATION WITH CORONARY ANGIOGRAM N/A 08/21/2014   Procedure: LEFT HEART CATHETERIZATION WITH CORONARY ANGIOGRAM;  Surgeon: Sherren Mocha, MD;  Location: [facility];  Service: Cardiovascular;  Laterality: N/A;   stents     last time she said was 1 yr ago      Social History:   reports that she has never smoked. She has never used smokeless tobacco. She reports that she does not drink alcohol and does not use drugs.   Family History:  Her family history includes Cancer in her brother; Diabetes in her brother, father, and son; Heart disease in her father; Stroke in her father and mother.   Allergies Allergies  Allergen Reactions   Ciprofloxacin Rash   Advair Diskus [Fluticasone-Salmeterol] Other (See Comments)    Throat closes   Albuterol     REACTION: closes throat   Alendronate Sodium Itching   Benadryl [Diphenhydramine Hcl] Itching   Benzonatate     REACTION: rash/hives   Cephalexin Nausea Only   Gabapentin Other (See Comments)    Loss of memory   Iohexol      Desc: rash and DIF BREATHING    Keflex [Cephalexin] Nausea And Vomiting   Morphine Nausea And Vomiting   Other Other (See Comments)    Decongestants- keeps pt awake at night, increased heart rate   Propoxyphene Hcl     Doesn't recall   Reclast [Zoledronic Acid] Other (See Comments)    Chest pain   Avapro [Irbesartan] Rash   Codeine Nausea And Vomiting, Swelling and Rash   Tessalon Perles Rash   Tramadol Nausea Only and Rash     Home Medications  Prior to Admission medications   Medication Sig Start Date End Date Taking? Authorizing  Provider  amLODipine (NORVASC) 2.5 MG tablet TAKE 1 TABLET BY MOUTH EVERY DAY Patient taking differently: Take 2.5 mg by mouth daily. 10/16/20  Yes Sherren Mocha, MD  aspirin 81 MG tablet Take 81 mg by mouth daily.   Yes [provider]  atorvastatin (LIPITOR) 20 MG tablet TAKE 1 TABLET BY MOUTH EVERY DAY Patient taking differently: Take 20 mg by mouth daily. 10/16/20  Yes Bhagat, Bhavinkumar, PA  cetirizine (ZYRTEC) 10 MG tablet Take 10 mg by mouth daily as needed for allergies. For allergies   Yes [provider]  Cholecalciferol (VITAMIN D3) 5000 UNITS CAPS Take 5,000 Units by mouth daily. For supplement   Yes [provider]  citalopram (CELEXA) 40 MG tablet TAKE 1 TABLET BY MOUTH EVERY DAY FOR DEPRESSION Patient taking differently: Take 40 mg by mouth daily. 12/23/20  Yes Wardell Honour, MD  Continuous Blood Gluc Receiver (FREESTYLE LIBRE 2 READER) DEVI Use to test blood sugar three times daily.  Dx: E11.9 06/13/20  Yes Reed, Tiffany L, DO  Continuous Blood Gluc Sensor (FREESTYLE LIBRE 2 SENSOR) MISC Use to test blood sugar three times daily. Dx: E11.9 06/13/20  Yes Reed, Tiffany L, DO  docusate sodium (COLACE) 100 MG capsule Take 100 mg by mouth daily.   Yes [provider]  ferrous sulfate 325 (65 FE) MG tablet Take 325 mg by mouth daily with breakfast.   Yes [provider]  furosemide (LASIX) 20 MG tablet Take 20 mg by mouth 2 (two) times daily as needed for fluid.   Yes [provider]  glipiZIDE (GLUCOTROL XL) 10 MG 24 hr tablet TAKE 1 TABLET BY MOUTH EVERY DAY IN THE MORNING AND 1 TABLET IN EVENING FOR SUGAR CONTROL Patient taking differently: Take 10 mg by mouth 2 (two) times daily with a meal. TAKE 1 TABLET BY MOUTH EVERY DAY IN THE MORNING AND 1 TABLET IN EVENING FOR SUGAR CONTROL 12/10/20  Yes Wardell Honour, MD  insulin NPH Human (HUMULIN N) 100 UNIT/ML injection INJECT 40 UNITS IN THE MORNING AND 40 UNITS IN THE  EVENING Patient taking differently: Inject 40 Units into the skin 2 (two) times daily before a meal. 11/26/20  Yes Wardell Honour, MD  Insulin Syringes, Disposable, U-100 1 ML MISC Use to inject insulin Dx: [id number] 11/04/18  Yes Reed, Tiffany L, DO  isosorbide mononitrate (IMDUR) 30 MG 24 hr tablet TAKE 1 TABLET BY MOUTH  EVERY DAY Patient taking differently: Take 30 mg by mouth daily. 10/16/20  Yes Bhagat, Bhavinkumar, PA  latanoprost (XALATAN) 0.005 % ophthalmic solution Place 1 drop into both eyes daily.   Yes [provider]  losartan-hydrochlorothiazide (HYZAAR) 50-12.5 MG tablet TAKE 1 TABLET BY MOUTH EVERY DAY FOR BLOOD PRESSURE Patient taking differently: Take 1 tablet by mouth daily. 11/05/20  Yes Bhagat, Bhavinkumar, PA  meclizine (ANTIVERT) 25 MG tablet Take 1 tablet (25 mg total) by mouth 3 (three) times daily as needed for dizziness. For dizziness 06/10/20  Yes Reed, Tiffany L, DO  nitroGLYCERIN (NITROSTAT) 0.4 MG SL tablet Place 0.4 mg under the tongue every 5 (five) minutes as needed for chest pain.   Yes [provider]  omeprazole (PRILOSEC) 20 MG capsule TAKE 1 CAPSULE BY MOUTH EVERY DAY Patient taking differently: Take 20 mg by mouth daily. 12/23/20  Yes Wardell Honour, MD  predniSONE (DELTASONE) 5 MG tablet TAKE 1 TABLET BY MOUTH EVERY DAY IN THE MORNING WITH FOOD TO REDUCE PAIN Patient taking differently: Take 5 mg by mouth daily with breakfast. 12/23/20  Yes [name], Dinah C, NP  donepezil (ARICEPT) 5 MG tablet Take 1 tablet (5 mg total) by mouth at bedtime. Patient not taking: Reported on 01/02/2021 12/10/20   Wardell Honour, MD     Critical care time: NA    Erick Colace ACNP-BC [place] Pager # [phone] OR # [phone] if no answer

## 2021-01-03 NOTE — H&P (Addendum)
History and Physical    [name] [id number] DOB: 1940-08-21 DOA: 01/02/2021  PCP: Wardell Honour, MD  Patient coming from: Home  I have personally briefly reviewed patient's old medical records in [place]  Chief Complaint: Constipation  HPI: [name] is a 80 y.o. female with medical history significant of HTN, HLD, DM2, prior Roux-en-y gastric bypass in 2007, 2 ventral hernia repairs with recurrence after each.  2 prior admissions for nausea, abdominal fullness, ? SBO, most recently 2015.  Pt presents to ED with c/o 7 day h/o "constipation".  Normally pt has BM daily but hasnt had BM in past 7 days.  Tried taking dulcolax without relief of symptoms, not passing gas, has had nausea, 1 episode of vomiting.  No CP, fevers, chills, dysuria, SOB.   ED Course: CT concerning for early or partial SBO.  Gen surg consulted and hospitalist asked to admit.   Review of Systems: As per HPI, otherwise all review of systems negative.  Past Medical History:  Diagnosis Date   Abdominal pain, unspecified site    Abnormality of gait    Apnea    Cardiomegaly    Chest pain, unspecified    Complication of anesthesia    hard time waking up   Depressive disorder, not elsewhere classified    Diabetes mellitus    Diabetic retinopathy (HCC)    Dizziness and giddiness    Dyskinesia of esophagus    Edema    Extrinsic asthma, unspecified    Female stress incontinence    Gout, unspecified    High cholesterol    Hypertension    Lipoma of other skin and subcutaneous tissue    Lumbago    Memory loss    Migraine without aura, with intractable migraine, so stated, with status migrainosus    Mild cognitive impairment, so stated    Nonspecific (abnormal) findings on radiological and other examination of abdominal area, including retroperitoneum    Nonspecific abnormal results of liver function study    Obesity, unspecified    Obstructive chronic bronchitis with exacerbation  (HCC)    Other and unspecified hyperlipidemia    Other B-complex deficiencies    Other nonspecific abnormal serum enzyme levels    Other specified cardiac dysrhythmias(427.89)    Other specified disease of sebaceous glands    Other symptoms involving cardiovascular system    Pain in joint, ankle and foot    Pain in joint, pelvic region and thigh    Pain in joint, shoulder region    Palpitations    Reflux esophagitis    Shortness of breath    Tension headache    Type I (juvenile type) diabetes mellitus without mention of complication, not stated as uncontrolled    Type I (juvenile type) diabetes mellitus without mention of complication, uncontrolled    Unspecified essential hypertension    Unspecified hypothyroidism    Unspecified vitamin D deficiency    Ventral hernia, unspecified, without mention of obstruction or gangrene     Past Surgical History:  Procedure Laterality Date   ABDOMINAL HYSTERECTOMY     APPENDECTOMY     BREAST SURGERY     EXPLORATORY LAPAROTOMY W/ BOWEL RESECTION  07/25/2004   PROCEDURE:  Laparoscopy, open laparotomy, resection of jejunojejunostomy   INCISIONAL HERNIA REPAIR  03/19/2006   PROCEDURE:  Open ventral hernia repair with mesh.   LAPAROSCOPIC GASTRIC BYPASS  07/22/2004   PROCEDURE:  Laparoscopic Roux-en-Y gastric bypass, antecolic, antegastric,   LAPAROSCOPIC INCISIONAL /  UMBILICAL / VENTRAL HERNIA REPAIR  09/22/2005   PROCEDURE:  Laparoscopic ventral hernia repair with mesh.   LEFT HEART CATH AND CORONARY ANGIOGRAPHY N/A 11/20/2016   Procedure: Left Heart Cath and Coronary Angiography;  Surgeon: Sherren Mocha, MD;  Location: [facility];  Service: Cardiovascular;  Laterality: N/A;   LEFT HEART CATHETERIZATION WITH CORONARY ANGIOGRAM N/A 08/21/2014   Procedure: LEFT HEART CATHETERIZATION WITH CORONARY ANGIOGRAM;  Surgeon: Sherren Mocha, MD;  Location: [facility];  Service: Cardiovascular;  Laterality: N/A;   stents     last time she said was  1 yr ago      reports that she has never smoked. She has never used smokeless tobacco. She reports that she does not drink alcohol and does not use drugs.  Allergies  Allergen Reactions   Ciprofloxacin Rash   Advair Diskus [Fluticasone-Salmeterol] Other (See Comments)    Throat closes   Albuterol     REACTION: closes throat   Alendronate Sodium Itching   Benadryl [Diphenhydramine Hcl] Itching   Benzonatate     REACTION: rash/hives   Cephalexin Nausea Only   Gabapentin Other (See Comments)    Loss of memory   Iohexol      Desc: rash and DIF BREATHING    Keflex [Cephalexin] Nausea And Vomiting   Morphine Nausea And Vomiting   Other Other (See Comments)    Decongestants- keeps pt awake at night, increased heart rate   Propoxyphene Hcl     Doesn't recall   Reclast [Zoledronic Acid] Other (See Comments)    Chest pain   Avapro [Irbesartan] Rash   Codeine Nausea And Vomiting, Swelling and Rash   Tessalon Perles Rash   Tramadol Nausea Only and Rash    Family History  Problem Relation Age of Onset   Stroke Mother    Stroke Father    Diabetes Father    Heart disease Father    Diabetes Son    Cancer Brother        BLADDER   Diabetes Brother      Prior to Admission medications   Medication Sig Start Date End Date Taking? Authorizing Provider  amLODipine (NORVASC) 2.5 MG tablet TAKE 1 TABLET BY MOUTH EVERY DAY Patient taking differently: Take 2.5 mg by mouth daily. 10/16/20  Yes Sherren Mocha, MD  aspirin 81 MG tablet Take 81 mg by mouth daily.   Yes [provider]  atorvastatin (LIPITOR) 20 MG tablet TAKE 1 TABLET BY MOUTH EVERY DAY Patient taking differently: Take 20 mg by mouth daily. 10/16/20  Yes Bhagat, Bhavinkumar, PA  cetirizine (ZYRTEC) 10 MG tablet Take 10 mg by mouth daily as needed for allergies. For allergies   Yes [provider]  Cholecalciferol (VITAMIN D3) 5000 UNITS CAPS Take 5,000 Units by mouth daily. For supplement   Yes [provider]  citalopram (CELEXA) 40 MG tablet TAKE 1 TABLET BY MOUTH EVERY DAY FOR DEPRESSION Patient taking differently: Take 40 mg by mouth daily. 12/23/20  Yes Wardell Honour, MD  Continuous Blood Gluc Receiver (FREESTYLE LIBRE 2 READER) DEVI Use to test blood sugar three times daily.  Dx: E11.9 06/13/20  Yes Reed, Tiffany L, DO  Continuous Blood Gluc Sensor (FREESTYLE LIBRE 2 SENSOR) MISC Use to test blood sugar three times daily. Dx: E11.9 06/13/20  Yes Reed, Tiffany L, DO  docusate sodium (COLACE) 100 MG capsule Take 100 mg by mouth daily.   Yes [provider]  ferrous sulfate 325 (65 FE)  MG tablet Take 325 mg by mouth daily with breakfast.   Yes [provider]  furosemide (LASIX) 20 MG tablet Take 20 mg by mouth 2 (two) times daily as needed for fluid.   Yes [provider]  glipiZIDE (GLUCOTROL XL) 10 MG 24 hr tablet TAKE 1 TABLET BY MOUTH EVERY DAY IN THE MORNING AND 1 TABLET IN EVENING FOR SUGAR CONTROL Patient taking differently: Take 10 mg by mouth 2 (two) times daily with a meal. TAKE 1 TABLET BY MOUTH EVERY DAY IN THE MORNING AND 1 TABLET IN EVENING FOR SUGAR CONTROL 12/10/20  Yes Wardell Honour, MD  insulin NPH Human (HUMULIN N) 100 UNIT/ML injection INJECT 40 UNITS IN THE MORNING AND 40 UNITS IN THE EVENING Patient taking differently: Inject 40 Units into the skin 2 (two) times daily before a meal. 11/26/20  Yes Wardell Honour, MD  Insulin Syringes, Disposable, U-100 1 ML MISC Use to inject insulin Dx: [id number] 11/04/18  Yes Reed, Tiffany L, DO  isosorbide mononitrate (IMDUR) 30 MG 24 hr tablet TAKE 1 TABLET BY MOUTH EVERY DAY Patient taking differently: Take 30 mg by mouth daily. 10/16/20  Yes Bhagat, Bhavinkumar, PA  latanoprost (XALATAN) 0.005 % ophthalmic solution Place 1 drop into both eyes daily.   Yes [provider]  losartan-hydrochlorothiazide (HYZAAR) 50-12.5 MG tablet TAKE 1 TABLET BY MOUTH EVERY DAY FOR BLOOD PRESSURE Patient  taking differently: Take 1 tablet by mouth daily. 11/05/20  Yes Bhagat, Bhavinkumar, PA  meclizine (ANTIVERT) 25 MG tablet Take 1 tablet (25 mg total) by mouth 3 (three) times daily as needed for dizziness. For dizziness 06/10/20  Yes Reed, Tiffany L, DO  nitroGLYCERIN (NITROSTAT) 0.4 MG SL tablet Place 0.4 mg under the tongue every 5 (five) minutes as needed for chest pain.   Yes [provider]  omeprazole (PRILOSEC) 20 MG capsule TAKE 1 CAPSULE BY MOUTH EVERY DAY Patient taking differently: Take 20 mg by mouth daily. 12/23/20  Yes Wardell Honour, MD  predniSONE (DELTASONE) 5 MG tablet TAKE 1 TABLET BY MOUTH EVERY DAY IN THE MORNING WITH FOOD TO REDUCE PAIN Patient taking differently: Take 5 mg by mouth daily with breakfast. 12/23/20  Yes [name], Dinah C, NP  donepezil (ARICEPT) 5 MG tablet Take 1 tablet (5 mg total) by mouth at bedtime. Patient not taking: Reported on 01/02/2021 12/10/20   Wardell Honour, MD    Physical Exam: Vitals:   01/02/21 1727 01/02/21 2045 01/02/21 2107 01/02/21 2111  BP:  (!) 200/136  (!) 164/70  Pulse:  70 77 73  Resp:    15  Temp:      TempSrc:      SpO2:  96% 96% 97%  Weight: 90.7 kg     Height: '5\' 2"'$  (1.575 m)       Constitutional: NAD, calm, comfortable Eyes: PERRL, lids and conjunctivae normal ENMT: Mucous membranes are moist. Posterior pharynx clear of any exudate or lesions.Normal dentition.  Neck: normal, supple, no masses, no thyromegaly Respiratory: clear to auscultation bilaterally, no wheezing, no crackles. Normal respiratory effort. No accessory muscle use.  Cardiovascular: Regular rate and rhythm, no murmurs / rubs / gallops. No extremity edema. 2+ pedal pulses. No carotid bruits.  Abdomen: Distended, NT, ventral hernia is soft. Musculoskeletal: no clubbing / cyanosis. No joint deformity upper and lower extremities. Good ROM, no contractures. Normal muscle tone.  Skin: no rashes, lesions, ulcers. No induration Neurologic: CN 2-12  grossly intact. Sensation intact, DTR normal. Strength  5/5 in all 4.  Psychiatric: Normal judgment and insight. Alert and oriented x 3. Normal mood.    Labs on Admission: I have personally reviewed following labs and imaging studies  CBC: Recent Labs  Lab 01/02/21 1733 01/02/21 1750  WBC 13.5*  --   NEUTROABS 11.1*  --   HGB 13.4 13.9  HCT 43.1 41.0  MCV 94.5  --   PLT 333  --    Basic Metabolic Panel: Recent Labs  Lab 01/02/21 1733 01/02/21 1750  NA 141 141  K 3.9 3.8  CL 105 103  CO2 29  --   GLUCOSE 105* 107*  BUN 14 15  CREATININE 0.96 1.00  CALCIUM 9.4  --    GFR: Estimated Creatinine Clearance: 45.4 mL/min (by C-G formula based on SCr of 1 mg/dL). Liver Function Tests: Recent Labs  Lab 01/02/21 1733  AST 19  ALT 19  ALKPHOS 76  BILITOT 1.0  PROT 6.3*  ALBUMIN 3.4*   Recent Labs  Lab 01/02/21 1733  LIPASE 27   No results for input(s): AMMONIA in the last 168 hours. Coagulation Profile: No results for input(s): INR, PROTIME in the last 168 hours. Cardiac Enzymes: No results for input(s): CKTOTAL, CKMB, CKMBINDEX, TROPONINI in the last 168 hours. BNP (last 3 results) No results for input(s): PROBNP in the last 8760 hours. HbA1C: No results for input(s): HGBA1C in the last 72 hours. CBG: Recent Labs  Lab 01/02/21 1716 01/02/21 2142 01/02/21 2355  GLUCAP 107* 108* 197*   Lipid Profile: No results for input(s): CHOL, HDL, LDLCALC, TRIG, CHOLHDL, LDLDIRECT in the last 72 hours. Thyroid Function Tests: No results for input(s): TSH, T4TOTAL, FREET4, T3FREE, THYROIDAB in the last 72 hours. Anemia Panel: No results for input(s): VITAMINB12, FOLATE, FERRITIN, TIBC, IRON, RETICCTPCT in the last 72 hours. Urine analysis:    Component Value Date/Time   COLORURINE YELLOW 01/02/2021 2213   APPEARANCEUR CLEAR 01/02/2021 2213   LABSPEC 1.015 01/02/2021 2213   PHURINE 5.0 01/02/2021 2213   GLUCOSEU NEGATIVE 01/02/2021 2213   HGBUR NEGATIVE 01/02/2021  2213   BILIRUBINUR NEGATIVE 01/02/2021 2213   KETONESUR NEGATIVE 01/02/2021 2213   PROTEINUR 30 (A) 01/02/2021 2213   UROBILINOGEN 0.2 10/19/2014 2035   NITRITE NEGATIVE 01/02/2021 2213   LEUKOCYTESUR TRACE (A) 01/02/2021 2213    Radiological Exams on Admission: CT Abdomen Pelvis Wo Contrast  Addendum Date: 01/02/2021   ADDENDUM REPORT: 01/02/2021 21:21 ADDENDUM: No definite internal hernia identified. Consider PO contrast administration with x-ray small-bowel follow-through to evaluate for passage of PO contrast past the small bowel into the large bowel. These results were called by telephone at the time of interpretation on 01/02/2021 at 9:18 pm to provider Dr. Dema Severin from surgery, who verbally acknowledged these results. Discussed similar findings and follow-up imaging with PA Margarita Mail and ED Attending earlier. Electronically Signed   By: Iven Finn M.D.   On: 01/02/2021 21:21   Addendum Date: 01/02/2021   ADDENDUM REPORT: 01/02/2021 18:51 ADDENDUM: These results were called by telephone at the time of interpretation on 01/02/2021 at 6:51 pm to provider [name] , who verbally acknowledged these results. Electronically Signed   By: Iven Finn M.D.   On: 01/02/2021 18:51   Result Date: 01/02/2021 CLINICAL DATA:  Nausea and vomiting. EXAM: CT ABDOMEN AND PELVIS WITHOUT CONTRAST TECHNIQUE: Multidetector CT imaging of the abdomen and pelvis was performed following the standard protocol without IV contrast. COMPARISON:  CT abdomen pelvis 08/19/2014, CT abdomen pelvis 01/24/2009, CT  abdomen pelvis 07/24/2004 FINDINGS: Lower chest: No acute abnormality. Hepatobiliary: No focal liver abnormality. Calcified gallstones within the gallbladder lumen. No associated gallbladder wall thickening or pericholecystic fluid. No biliary dilatation. Pancreas: No focal lesion. Normal pancreatic contour. No surrounding inflammatory changes. No main pancreatic ductal dilatation. Spleen: Normal in size  without focal abnormality. Adrenals/Urinary Tract: No adrenal nodule bilaterally. Bilateral perinephric stranding nonspecific. No nephrolithiasis and no hydronephrosis. Interval increase in size of an exophytic 6.3 cm (from 2.2 cm) left renal lesion with a density of 20 Hounsfield units. Slight interval increase in size of a smaller fluid density lesion measuring up to 4 cm. Subcentimeter hypodensities too small to characterize. There is a 1 cm exophytic lesion along the inferior pole of the right kidney with a density of simple fluid. There is interval increase in size an exophytic inferior right renal pole 5.3 cm lesion with a density of 22 Hounsfield units. Subcentimeter hyperdensity along the superior pole of the right kidney (6:90) too small to characterize. No ureterolithiasis or hydroureter. The urinary bladder is unremarkable. Stomach/Bowel: Surgical changes related to Roux-en-Y gastric bypass. Stomach is within normal limits. Several loops of dilated small bowel along the anastomotic sutures in the mid abdomen with associated fecalized material. The caliber of the small bowel measures up to 6.6 cm (3:36) as well as 6.4 cm (3:45) no small bowel wall thickening. No pneumatosis. No evidence of large bowel wall thickening or dilatation. Appendix appears normal. Vascular/Lymphatic: No abdominal aorta or iliac aneurysm. Mild atherosclerotic plaque of the aorta and its branches. No abdominal, pelvic, or inguinal lymphadenopathy. Reproductive: Status post hysterectomy. No adnexal masses. Other: Interval increase in size of a lobulated partially calcified soft tissue density within the anterior upper abdomen measuring up to 4.9 x 6 cm (3:18) slowly enlarged but visualized at least as far back as 2010. No intraperitoneal free fluid. No intraperitoneal free gas. No organized fluid collection. Musculoskeletal: Ventral wall hernia repair with mesh with diastasis rectus and recurrence of the ventral wall hernia with a  large abdominal defect. The hernia contains a loop of transverse colon as well as several loops of small bowel, including the loops involving the obstruction. No suspicious lytic or blastic osseous lesions. No acute displaced fracture. Multilevel degenerative changes of the spine. IMPRESSION: 1. Several loops of dilated small bowel along the jejunojejunosotmy and small bowel resection anastomotic sutures in the mid abdomen with associated luminal fecalized material in a patient status post Roux-en-Y gastric bypass and small bowel resection. Findings may represent early/partial small bowel obstruction possibly due to adhesions. No bowel perforation. No pneumatosis. 2. Ventral wall hernia repair with mesh with diastasis rectus and recurrence of the ventral wall hernia with a large abdominal defect. The hernia contains a loop of transverse colon as well as several loops of small bowel, including the loops involving the obstruction. The hernia is not the cause of the obstruction. 3. Interval increase in size of several indeterminate renal lesions. Recommend nonemergent MRI renal protocol for further evaluation. 4. Cholelithiasis with no findings of acute cholecystitis. 5. Interval increase in size (compared to 2010 in 2016) of a lobulated partially calcified soft tissue density within the upper abdomen measuring up to 4.9 x 6 cm. Finding may represent postsurgical changes. Electronically Signed: By: Iven Finn M.D. On: 01/02/2021 18:46    EKG: Independently reviewed.  Assessment/Plan Principal Problem:   SBO (small bowel obstruction) (HCC) Active Problems:   Controlled diabetes mellitus with nephropathy (HCC)   Essential hypertension, benign  Recurrent ventral incisional hernia   Current chronic use of systemic steroids    SBO - Gen surg thinks due to adhesions, doubts internal hernia (see note) Plan for conservative management: NPO except for sips with meds IVF Holding off of NGT for the  moment Hold laxatives Zofran PRN nausea Repeat BMP in AM DM2 - while pt npo: Hold glipizide Reduce NPH to 15u BID Sensitive SSI Q4H HTN - Continue home BP meds But will hold lasix + HCTZ PRN labetalol if needed Current chronic use of systemic steroids - Cont PO prednisone  DVT prophylaxis: Lovenox Code Status: DNR - confirmed with patient Family Communication: No family in room Disposition Plan: Home after SBO resolved Consults called: Dr. Dema Severin Admission status: Admit to inpatient  Severity of Illness: The appropriate patient status for this patient is INPATIENT. Inpatient status is judged to be reasonable and necessary in order to provide the required intensity of service to ensure the patient's safety. The patient's presenting symptoms, physical exam findings, and initial radiographic and laboratory data in the context of their chronic comorbidities is felt to place them at high risk for further clinical deterioration. Furthermore, it is not anticipated that the patient will be medically stable for discharge from the hospital within 2 midnights of admission. The following factors support the patient status of inpatient.   IP status due to SBO   * I certify that at the point of admission it is my clinical judgment that the patient will require inpatient hospital care spanning beyond 2 midnights from the point of admission due to high intensity of service, high risk for further deterioration and high frequency of surveillance required.*   Madelaine Whipple M. DO [facility]  How to contact the [facility] Attending or Consulting provider [place] or covering provider during after hours [place], for this patient?  Check the care team in [facility] and look for a) attending/consulting [facility] provider listed and b) the [facility] team listed Log into [email or web address]  Amion Physician Scheduling and messaging for groups and whole hospitals  On call and physician scheduling software for group practices,  residents, hospitalists and other medical providers for call, clinic, rotation and shift schedules. On[employer] is a hospital-wide system for scheduling doctors and paging doctors on call. EasyPlot is for scientific plotting and data analysis.  [email or web address]  and use [place]'s universal password to access. If you do not have the password, please contact the hospital operator.  Locate the [facility] provider you are looking for under [facility] and page to a number that you can be directly reached. If you still have difficulty reaching the provider, please page the [facility] (Director on Call) for the Hospitalists listed on amion for assistance.  01/03/2021, 12:08 AM

## 2021-01-04 DIAGNOSIS — K56609 Unspecified intestinal obstruction, unspecified as to partial versus complete obstruction: Secondary | ICD-10-CM | POA: Diagnosis not present

## 2021-01-04 LAB — GLUCOSE, CAPILLARY
Glucose-Capillary: 259 mg/dL — ABNORMAL HIGH (ref 70–99)
Glucose-Capillary: 267 mg/dL — ABNORMAL HIGH (ref 70–99)
Glucose-Capillary: 281 mg/dL — ABNORMAL HIGH (ref 70–99)
Glucose-Capillary: 294 mg/dL — ABNORMAL HIGH (ref 70–99)
Glucose-Capillary: 320 mg/dL — ABNORMAL HIGH (ref 70–99)
Glucose-Capillary: 404 mg/dL — ABNORMAL HIGH (ref 70–99)

## 2021-01-04 LAB — CBC
HCT: 41.8 % (ref 36.0–46.0)
Hemoglobin: 12.7 g/dL (ref 12.0–15.0)
MCH: 28.9 pg (ref 26.0–34.0)
MCHC: 30.4 g/dL (ref 30.0–36.0)
MCV: 95 fL (ref 80.0–100.0)
Platelets: 316 10*3/uL (ref 150–400)
RBC: 4.4 MIL/uL (ref 3.87–5.11)
RDW: 14 % (ref 11.5–15.5)
WBC: 6.7 10*3/uL (ref 4.0–10.5)
nRBC: 0 % (ref 0.0–0.2)

## 2021-01-04 LAB — BASIC METABOLIC PANEL
Anion gap: 9 (ref 5–15)
BUN: 9 mg/dL (ref 8–23)
CO2: 25 mmol/L (ref 22–32)
Calcium: 9.2 mg/dL (ref 8.9–10.3)
Chloride: 102 mmol/L (ref 98–111)
Creatinine, Ser: 0.99 mg/dL (ref 0.44–1.00)
GFR, Estimated: 57 mL/min — ABNORMAL LOW (ref >60)
Glucose, Bld: 349 mg/dL — ABNORMAL HIGH (ref 70–99)
Potassium: 4 mmol/L (ref 3.5–5.1)
Sodium: 136 mmol/L (ref 135–145)

## 2021-01-04 MED ORDER — INSULIN ASPART 100 UNIT/ML IJ SOLN
4.0000 [IU] | Freq: Once | INTRAMUSCULAR | Status: AC
  Administered 2021-01-04: 4 [IU] via SUBCUTANEOUS

## 2021-01-04 MED ORDER — INSULIN ASPART 100 UNIT/ML IJ SOLN: 4.0000 [IU] | Freq: Once | INTRAMUSCULAR | Status: AC

## 2021-01-04 NOTE — Progress Notes (Signed)
Assessment & Plan: HD#3 - partial SBO likely due to adhesions - h/o RYGB 2007; incisional hernia repair x2 with mesh with recurrence - SBO protocol - contrast to colon yesterday - BM yesterday, flatus today - full liquid diet   FEN: Full liquid diet ID: none VTE: lovenox   DM HTN   Patient comfortable, denies nausea or emesis.  Encouraged to ambulate in halls today.  Will follow.        Mckenzie Gemma, MD       [facility]       Office: [phone]   Chief Complaint: SBO  Subjective: Patient in bed, comfortable.  Objective: Vital signs in last 24 hours: Temp:  [97.5 F (36.4 C)-98.6 F (37 C)] 98.6 F (37 C) (08/06 0739) Pulse Rate:  [49-92] 86 (08/06 0739) Resp:  [16-18] 16 (08/06 0739) BP: (119-154)/(53-84) 153/73 (08/06 0739) SpO2:  [97 %-100 %] 100 % (08/06 0739)    Intake/Output from previous day: 08/05 0701 - 08/06 0700 In: 1320 [P.O.:320; I.V.:1000] Out: 301 [Urine:301] Intake/Output this shift: No intake/output data recorded.  Physical Exam: HEENT - sclerae clear, mucous membranes moist Neck - soft Abdomen - protuberant; large ventral incisional hernia, soft, partially reducible; wounds well healed; non-tender Ext - no edema, non-tender Neuro - alert & oriented, no focal deficits  Lab Results:  Recent Labs    01/03/21 0519 01/04/21 0025  WBC 8.0 6.7  HGB 12.0 12.7  HCT 38.9 41.8  PLT 268 316   BMET Recent Labs    01/03/21 0519 01/04/21 0025  NA 141 136  K 3.4* 4.0  CL 106 102  CO2 30 25  GLUCOSE 63* 349*  BUN 12 9  CREATININE 0.79 0.99  CALCIUM 8.7* 9.2   PT/INR No results for input(s): LABPROT, INR in the last 72 hours. Comprehensive Metabolic Panel:    Component Value Date/Time   NA 136 01/04/2021 0025   NA 141 01/03/2021 0519   NA 140 09/27/2017 1511   NA 139 11/17/2016 1054   K 4.0 01/04/2021 0025   K 3.4 (L) 01/03/2021 0519   CL 102 01/04/2021 0025   CL 106 01/03/2021 0519   CO2 25 01/04/2021  0025   CO2 30 01/03/2021 0519   BUN 9 01/04/2021 0025   BUN 12 01/03/2021 0519   BUN 17 09/27/2017 1511   BUN 24 11/17/2016 1054   CREATININE 0.99 01/04/2021 0025   CREATININE 0.79 01/03/2021 0519   CREATININE 0.85 10/08/2020 0855   CREATININE 0.86 06/06/2020 1001   GLUCOSE 349 (H) 01/04/2021 0025   GLUCOSE 63 (L) 01/03/2021 0519   CALCIUM 9.2 01/04/2021 0025   CALCIUM 8.7 (L) 01/03/2021 0519   AST 19 01/02/2021 1733   AST 14 10/08/2020 0855   ALT 19 01/02/2021 1733   ALT 15 10/08/2020 0855   ALKPHOS 76 01/02/2021 1733   ALKPHOS 80 01/20/2016 1044   BILITOT 1.0 01/02/2021 1733   BILITOT 0.5 10/08/2020 0855   BILITOT 0.5 08/07/2015 0854   BILITOT 0.4 04/29/2015 0940   PROT 6.3 (L) 01/02/2021 1733   PROT 5.9 (L) 10/08/2020 0855   PROT 6.2 08/07/2015 0854   PROT 5.6 (L) 04/29/2015 0940   ALBUMIN 3.4 (L) 01/02/2021 1733   ALBUMIN 3.7 01/20/2016 1044   ALBUMIN 3.7 08/07/2015 0854   ALBUMIN 3.4 (L) 04/29/2015 0940    Studies/Results: CT Abdomen Pelvis Wo Contrast  Addendum Date: 01/02/2021   ADDENDUM REPORT: 01/02/2021 21:21 ADDENDUM: No definite internal hernia identified.  Consider PO contrast administration with x-ray small-bowel follow-through to evaluate for passage of PO contrast past the small bowel into the large bowel. These results were called by telephone at the time of interpretation on 01/02/2021 at 9:18 pm to provider Dr. Dema Severin from surgery, who verbally acknowledged these results. Discussed similar findings and follow-up imaging with PA Margarita Mail and ED Attending earlier. Electronically Signed   By: Iven Finn M.D.   On: 01/02/2021 21:21   Addendum Date: 01/02/2021   ADDENDUM REPORT: 01/02/2021 18:51 ADDENDUM: These results were called by telephone at the time of interpretation on 01/02/2021 at 6:51 pm to provider [name] , who verbally acknowledged these results. Electronically Signed   By: Iven Finn M.D.   On: 01/02/2021 18:51   Result Date:  01/02/2021 CLINICAL DATA:  Nausea and vomiting. EXAM: CT ABDOMEN AND PELVIS WITHOUT CONTRAST TECHNIQUE: Multidetector CT imaging of the abdomen and pelvis was performed following the standard protocol without IV contrast. COMPARISON:  CT abdomen pelvis 08/19/2014, CT abdomen pelvis 01/24/2009, CT abdomen pelvis 07/24/2004 FINDINGS: Lower chest: No acute abnormality. Hepatobiliary: No focal liver abnormality. Calcified gallstones within the gallbladder lumen. No associated gallbladder wall thickening or pericholecystic fluid. No biliary dilatation. Pancreas: No focal lesion. Normal pancreatic contour. No surrounding inflammatory changes. No main pancreatic ductal dilatation. Spleen: Normal in size without focal abnormality. Adrenals/Urinary Tract: No adrenal nodule bilaterally. Bilateral perinephric stranding nonspecific. No nephrolithiasis and no hydronephrosis. Interval increase in size of an exophytic 6.3 cm (from 2.2 cm) left renal lesion with a density of 20 Hounsfield units. Slight interval increase in size of a smaller fluid density lesion measuring up to 4 cm. Subcentimeter hypodensities too small to characterize. There is a 1 cm exophytic lesion along the inferior pole of the right kidney with a density of simple fluid. There is interval increase in size an exophytic inferior right renal pole 5.3 cm lesion with a density of 22 Hounsfield units. Subcentimeter hyperdensity along the superior pole of the right kidney (6:90) too small to characterize. No ureterolithiasis or hydroureter. The urinary bladder is unremarkable. Stomach/Bowel: Surgical changes related to Roux-en-Y gastric bypass. Stomach is within normal limits. Several loops of dilated small bowel along the anastomotic sutures in the mid abdomen with associated fecalized material. The caliber of the small bowel measures up to 6.6 cm (3:36) as well as 6.4 cm (3:45) no small bowel wall thickening. No pneumatosis. No evidence of large bowel wall  thickening or dilatation. Appendix appears normal. Vascular/Lymphatic: No abdominal aorta or iliac aneurysm. Mild atherosclerotic plaque of the aorta and its branches. No abdominal, pelvic, or inguinal lymphadenopathy. Reproductive: Status post hysterectomy. No adnexal masses. Other: Interval increase in size of a lobulated partially calcified soft tissue density within the anterior upper abdomen measuring up to 4.9 x 6 cm (3:18) slowly enlarged but visualized at least as far back as 2010. No intraperitoneal free fluid. No intraperitoneal free gas. No organized fluid collection. Musculoskeletal: Ventral wall hernia repair with mesh with diastasis rectus and recurrence of the ventral wall hernia with a large abdominal defect. The hernia contains a loop of transverse colon as well as several loops of small bowel, including the loops involving the obstruction. No suspicious lytic or blastic osseous lesions. No acute displaced fracture. Multilevel degenerative changes of the spine. IMPRESSION: 1. Several loops of dilated small bowel along the jejunojejunosotmy and small bowel resection anastomotic sutures in the mid abdomen with associated luminal fecalized material in a patient status post Roux-en-Y gastric bypass  and small bowel resection. Findings may represent early/partial small bowel obstruction possibly due to adhesions. No bowel perforation. No pneumatosis. 2. Ventral wall hernia repair with mesh with diastasis rectus and recurrence of the ventral wall hernia with a large abdominal defect. The hernia contains a loop of transverse colon as well as several loops of small bowel, including the loops involving the obstruction. The hernia is not the cause of the obstruction. 3. Interval increase in size of several indeterminate renal lesions. Recommend nonemergent MRI renal protocol for further evaluation. 4. Cholelithiasis with no findings of acute cholecystitis. 5. Interval increase in size (compared to 2010 in  2016) of a lobulated partially calcified soft tissue density within the upper abdomen measuring up to 4.9 x 6 cm. Finding may represent postsurgical changes. Electronically Signed: By: Iven Finn M.D. On: 01/02/2021 18:46   DG Abd Portable 1V-Small Bowel Obstruction Protocol-initial, 8 hr delay  Result Date: 01/03/2021 CLINICAL DATA:  8 hour delay.  Small bowel obstruction. EXAM: PORTABLE ABDOMEN - 1 VIEW COMPARISON:  CT from yesterday FINDINGS: Enteric contrast has reached the distal colon. There are still dilated small bowel loops in the central abdomen. No concerning mass effect or gas collection. Osteopenia and atherosclerosis. IMPRESSION: Partial small bowel obstruction. Enteric contrast has reached the nondilated colon. Electronically Signed   By: Monte Fantasia M.D.   On: 01/03/2021 07:10      [name] 01/04/2021   Patient ID: [name], female   DOB: 14-Apr-1941, 80 y.o.   MRN: [id number]

## 2021-01-04 NOTE — Progress Notes (Signed)
PROGRESS NOTE    [name]  [id number] DOB: November 15, 1940 DOA: 01/02/2021 PCP: Wardell Honour, MD   Chief Complaint  Patient presents with   Hypoglycemia   Constipation    Brief Narrative:  Patient is an 80 year old Caucasian female with past medical history significant for HTN, HLD, DM2, prior Roux-en-y gastric bypass in 2007, 2 ventral hernia repairs with recurrence after each.  Patient was admitted with query small bowel obstruction.  Patient tells me that she has had bowel movement today.  Patient has also broken wind.  Surgical team is assisting with patient's care.  Patient is currently on clear liquids.    Assessment & Plan:   Principal Problem:   SBO (small bowel obstruction) (HCC) Active Problems:   Controlled diabetes mellitus with nephropathy ([place])   Essential hypertension, benign   Recurrent ventral incisional hernia   Current chronic use of systemic steroids   SBO: -Likely related to adhesions. -General surgery is assisting with patient's care. -Patient has had bowel movement today.  Patient is breaking wind. -Patient is currently on clear liquids. -Patient is improving.    DM2  - Hold glipizide -Hold her NPH, and keep on Lantus, will add insulin sliding scale. 01/04/2021 Continue to optimize.   HTN - Continue home BP meds But will hold lasix + HCTZ PRN labetalol if needed  Current chronic use of systemic steroids - Cont PO prednisone    DVT prophylaxis: Lovenox Code Status: DNR Family Communication: None at bedside Disposition:   Status is: Inpatient  Remains inpatient appropriate because:IV treatments appropriate due to intensity of illness or inability to take PO  Dispo: The patient is from: Home              Anticipated d/c is to: Home              Patient currently is not medically stable to d/c.   Difficult to place patient No       Consultants:  General surgery  Subjective: -No abdominal pain, nausea or  vomiting.  Objective: Vitals:   01/04/21 0411 01/04/21 0739 01/04/21 1146 01/04/21 1552  BP: (!) 154/73 (!) 153/73 (!) 147/67 (!) 148/60  Pulse: 78 86 80 81  Resp: '17 16 18 18  '$ Temp: (!) 97.5 F (36.4 C) 98.6 F (37 C) 98.2 F (36.8 C) 98.4 F (36.9 C)  TempSrc: Oral Oral Oral Oral  SpO2: 100% 100% 97% 96%  Weight:      Height:        Intake/Output Summary (Last 24 hours) at 01/04/2021 1807 Last data filed at 01/04/2021 1300 Gross per 24 hour  Intake 1800 ml  Output 301 ml  Net 1499 ml   Filed Weights   01/02/21 1727  Weight: 90.7 kg    Examination: General condition: Patient is awake, alert and oriented to time, place and person. HEENT: Not pale.  No jaundice. Neck: Supple.  No JVD. Abdomen: Obese, soft and nontender.  Bowel sounds are present.  Organs are difficult to assess. Neuro: Awake and alert.  Patient moves all extremities. Extremities: No leg edema. CVS: S1-S2. Lungs: Clear to auscultation.  Data Reviewed: I have personally reviewed following labs and imaging studies  CBC: Recent Labs  Lab 01/02/21 1733 01/02/21 1750 01/03/21 0519 01/04/21 0025  WBC 13.5*  --  8.0 6.7  NEUTROABS 11.1*  --   --   --   HGB 13.4 13.9 12.0 12.7  HCT 43.1 41.0 38.9 41.8  MCV 94.5  --  94.4 95.0  PLT 333  --  268 316     Basic Metabolic Panel: Recent Labs  Lab 01/02/21 1733 01/02/21 1750 01/03/21 0519 01/04/21 0025  NA 141 141 141 136  K 3.9 3.8 3.4* 4.0  CL 105 103 106 102  CO2 29  --  30 25  GLUCOSE 105* 107* 63* 349*  BUN '14 15 12 9  '$ CREATININE 0.96 1.00 0.79 0.99  CALCIUM 9.4  --  8.7* 9.2     GFR: Estimated Creatinine Clearance: 45.9 mL/min (by C-G formula based on SCr of 0.99 mg/dL).  Liver Function Tests: Recent Labs  Lab 01/02/21 1733  AST 19  ALT 19  ALKPHOS 76  BILITOT 1.0  PROT 6.3*  ALBUMIN 3.4*     CBG: Recent Labs  Lab 01/04/21 0007 01/04/21 0410 01/04/21 0742 01/04/21 1140 01/04/21 1548  GLUCAP 320* 294* 267* 404* 281*       No results found for this or any previous visit (from the past 240 hour(s)).       Radiology Studies: CT Abdomen Pelvis Wo Contrast  Addendum Date: 01/02/2021   ADDENDUM REPORT: 01/02/2021 21:21 ADDENDUM: No definite internal hernia identified. Consider PO contrast administration with x-ray small-bowel follow-through to evaluate for passage of PO contrast past the small bowel into the large bowel. These results were called by telephone at the time of interpretation on 01/02/2021 at 9:18 pm to provider Dr. Dema Severin from surgery, who verbally acknowledged these results. Discussed similar findings and follow-up imaging with PA Margarita Mail and ED Attending earlier. Electronically Signed   By: Iven Finn M.D.   On: 01/02/2021 21:21   Addendum Date: 01/02/2021   ADDENDUM REPORT: 01/02/2021 18:51 ADDENDUM: These results were called by telephone at the time of interpretation on 01/02/2021 at 6:51 pm to provider [name] , who verbally acknowledged these results. Electronically Signed   By: Iven Finn M.D.   On: 01/02/2021 18:51   Result Date: 01/02/2021 CLINICAL DATA:  Nausea and vomiting. EXAM: CT ABDOMEN AND PELVIS WITHOUT CONTRAST TECHNIQUE: Multidetector CT imaging of the abdomen and pelvis was performed following the standard protocol without IV contrast. COMPARISON:  CT abdomen pelvis 08/19/2014, CT abdomen pelvis 01/24/2009, CT abdomen pelvis 07/24/2004 FINDINGS: Lower chest: No acute abnormality. Hepatobiliary: No focal liver abnormality. Calcified gallstones within the gallbladder lumen. No associated gallbladder wall thickening or pericholecystic fluid. No biliary dilatation. Pancreas: No focal lesion. Normal pancreatic contour. No surrounding inflammatory changes. No main pancreatic ductal dilatation. Spleen: Normal in size without focal abnormality. Adrenals/Urinary Tract: No adrenal nodule bilaterally. Bilateral perinephric stranding nonspecific. No nephrolithiasis and no  hydronephrosis. Interval increase in size of an exophytic 6.3 cm (from 2.2 cm) left renal lesion with a density of 20 Hounsfield units. Slight interval increase in size of a smaller fluid density lesion measuring up to 4 cm. Subcentimeter hypodensities too small to characterize. There is a 1 cm exophytic lesion along the inferior pole of the right kidney with a density of simple fluid. There is interval increase in size an exophytic inferior right renal pole 5.3 cm lesion with a density of 22 Hounsfield units. Subcentimeter hyperdensity along the superior pole of the right kidney (6:90) too small to characterize. No ureterolithiasis or hydroureter. The urinary bladder is unremarkable. Stomach/Bowel: Surgical changes related to Roux-en-Y gastric bypass. Stomach is within normal limits. Several loops of dilated small bowel along the anastomotic sutures in the mid abdomen with associated fecalized material. The caliber of the small bowel measures up  to 6.6 cm (3:36) as well as 6.4 cm (3:45) no small bowel wall thickening. No pneumatosis. No evidence of large bowel wall thickening or dilatation. Appendix appears normal. Vascular/Lymphatic: No abdominal aorta or iliac aneurysm. Mild atherosclerotic plaque of the aorta and its branches. No abdominal, pelvic, or inguinal lymphadenopathy. Reproductive: Status post hysterectomy. No adnexal masses. Other: Interval increase in size of a lobulated partially calcified soft tissue density within the anterior upper abdomen measuring up to 4.9 x 6 cm (3:18) slowly enlarged but visualized at least as far back as 2010. No intraperitoneal free fluid. No intraperitoneal free gas. No organized fluid collection. Musculoskeletal: Ventral wall hernia repair with mesh with diastasis rectus and recurrence of the ventral wall hernia with a large abdominal defect. The hernia contains a loop of transverse colon as well as several loops of small bowel, including the loops involving the  obstruction. No suspicious lytic or blastic osseous lesions. No acute displaced fracture. Multilevel degenerative changes of the spine. IMPRESSION: 1. Several loops of dilated small bowel along the jejunojejunosotmy and small bowel resection anastomotic sutures in the mid abdomen with associated luminal fecalized material in a patient status post Roux-en-Y gastric bypass and small bowel resection. Findings may represent early/partial small bowel obstruction possibly due to adhesions. No bowel perforation. No pneumatosis. 2. Ventral wall hernia repair with mesh with diastasis rectus and recurrence of the ventral wall hernia with a large abdominal defect. The hernia contains a loop of transverse colon as well as several loops of small bowel, including the loops involving the obstruction. The hernia is not the cause of the obstruction. 3. Interval increase in size of several indeterminate renal lesions. Recommend nonemergent MRI renal protocol for further evaluation. 4. Cholelithiasis with no findings of acute cholecystitis. 5. Interval increase in size (compared to 2010 in 2016) of a lobulated partially calcified soft tissue density within the upper abdomen measuring up to 4.9 x 6 cm. Finding may represent postsurgical changes. Electronically Signed: By: Iven Finn M.D. On: 01/02/2021 18:46   DG Abd Portable 1V-Small Bowel Obstruction Protocol-initial, 8 hr delay  Result Date: 01/03/2021 CLINICAL DATA:  8 hour delay.  Small bowel obstruction. EXAM: PORTABLE ABDOMEN - 1 VIEW COMPARISON:  CT from yesterday FINDINGS: Enteric contrast has reached the distal colon. There are still dilated small bowel loops in the central abdomen. No concerning mass effect or gas collection. Osteopenia and atherosclerosis. IMPRESSION: Partial small bowel obstruction. Enteric contrast has reached the nondilated colon. Electronically Signed   By: Monte Fantasia M.D.   On: 01/03/2021 07:10        Scheduled Meds:  amLODipine   2.5 mg Oral Daily   citalopram  40 mg Oral Daily   enoxaparin (LOVENOX) injection  40 mg Subcutaneous Q24H   EPINEPHrine  0.3 mg Intramuscular Once   EPINEPHrine  0.3 mg Intramuscular Once   insulin aspart  0-9 Units Subcutaneous TID WC   insulin glargine-yfgn  10 Units Subcutaneous Daily   isosorbide mononitrate  30 mg Oral Daily   latanoprost  1 drop Both Eyes QHS   losartan  50 mg Oral Daily   pantoprazole  40 mg Oral BID AC   predniSONE  5 mg Oral Q breakfast   Racepinephrine HCl  0.5 mL Nebulization Once   Continuous Infusions:  lactated ringers 100 mL/hr at 01/04/21 0433     LOS: 2 days      Bonnell Public, MD [facility]   To contact the attending provider between 7A-7P  or the covering provider during after hours 7P-7A, please log into the web site [email or web address] and access using universal [place] password for that web site. If you do not have the password, please call the hospital operator.  01/04/2021, 6:07 PM

## 2021-01-05 DIAGNOSIS — K56609 Unspecified intestinal obstruction, unspecified as to partial versus complete obstruction: Secondary | ICD-10-CM | POA: Diagnosis not present

## 2021-01-05 LAB — GLUCOSE, CAPILLARY
Glucose-Capillary: 168 mg/dL — ABNORMAL HIGH (ref 70–99)
Glucose-Capillary: 221 mg/dL — ABNORMAL HIGH (ref 70–99)
Glucose-Capillary: 242 mg/dL — ABNORMAL HIGH (ref 70–99)
Glucose-Capillary: 244 mg/dL — ABNORMAL HIGH (ref 70–99)
Glucose-Capillary: 250 mg/dL — ABNORMAL HIGH (ref 70–99)
Glucose-Capillary: 259 mg/dL — ABNORMAL HIGH (ref 70–99)

## 2021-01-05 LAB — GLUCOSE, RANDOM: Glucose, Bld: 388 mg/dL — ABNORMAL HIGH (ref 70–99)

## 2021-01-05 NOTE — Progress Notes (Signed)
PROGRESS NOTE    [name]  [id number] DOB: Nov 07, 1940 DOA: 01/02/2021 PCP: Wardell Honour, MD   Chief Complaint  Patient presents with   Hypoglycemia   Constipation    Brief Narrative:  Patient is an 80 year old Caucasian female with past medical history significant for HTN, HLD, DM2, prior Roux-en-y gastric bypass in 2007, 2 ventral hernia repairs with recurrence after each.  Patient was admitted with query small bowel obstruction.  Patient tells me that she has had bowel movement today.  Patient has also broken wind.  Surgical team is assisting with patient's care.  Patient is currently on clear liquids.  01/05/2021: Patient continues to improve.  Patient has continued to have bowel movements.  Diet is being advanced as tolerated.  Patient may likely be discharged back home tomorrow.    Assessment & Plan:   Principal Problem:   SBO (small bowel obstruction) (HCC) Active Problems:   Controlled diabetes mellitus with nephropathy ([place])   Essential hypertension, benign   Recurrent ventral incisional hernia   Current chronic use of systemic steroids   SBO: -Likely related to adhesions. -General surgery is assisting with patient's care. -Patient has had bowel movement today.  Patient is breaking wind. -Patient is currently on clear liquids. -Patient is improving. January 05, 2021: Patient continues to improve.  Please see above documentation.    DM2  - Hold glipizide -Hold her NPH, and keep on Lantus, will add insulin sliding scale. 01/04/2021 Continue to optimize.   HTN - Continue home BP meds But will hold lasix + HCTZ PRN labetalol if needed  Current chronic use of systemic steroids - Cont PO prednisone    DVT prophylaxis: Lovenox Code Status: DNR Family Communication: None at bedside Disposition:   Status is: Inpatient  Remains inpatient appropriate because:IV treatments appropriate due to intensity of illness or inability to take PO  Dispo: The  patient is from: Home              Anticipated d/c is to: Home              Patient currently is not medically stable to d/c.   Difficult to place patient No       Consultants:  General surgery  Subjective: -No abdominal pain, nausea or vomiting.  Objective: Vitals:   01/05/21 0007 01/05/21 0409 01/05/21 1456 01/05/21 2101  BP: (!) 149/68 (!) 172/71 (!) 156/64 (!) 149/54  Pulse: 72 73 76 (!) 51  Resp: '17 17 16 17  '$ Temp: 98.6 F (37 C) 98 F (36.7 C) 98.7 F (37.1 C) 98.2 F (36.8 C)  TempSrc: Oral Oral Oral Oral  SpO2: 100% 100% 95% 98%  Weight:      Height:        Intake/Output Summary (Last 24 hours) at 01/05/2021 2122 Last data filed at 01/05/2021 1400 Gross per 24 hour  Intake 580 ml  Output 4 ml  Net 576 ml    Filed Weights   01/02/21 1727  Weight: 90.7 kg    Examination: General condition: Patient is awake, alert and oriented to time, place and person. HEENT: Not pale.  No jaundice. Neck: Supple.  No JVD. Abdomen: Obese, soft and nontender.  Bowel sounds are present.  Organs are difficult to assess. Neuro: Awake and alert.  Patient moves all extremities. Extremities: No leg edema. CVS: S1-S2. Lungs: Clear to auscultation.  Data Reviewed: I have personally reviewed following labs and imaging studies  CBC: Recent Labs  Lab 01/02/21 1733  01/02/21 1750 01/03/21 0519 01/04/21 0025  WBC 13.5*  --  8.0 6.7  NEUTROABS 11.1*  --   --   --   HGB 13.4 13.9 12.0 12.7  HCT 43.1 41.0 38.9 41.8  MCV 94.5  --  94.4 95.0  PLT 333  --  268 316     Basic Metabolic Panel: Recent Labs  Lab 01/02/21 1733 01/02/21 1750 01/03/21 0519 01/04/21 0025 01/05/21 0824  NA 141 141 141 136  --   K 3.9 3.8 3.4* 4.0  --   CL 105 103 106 102  --   CO2 29  --  30 25  --   GLUCOSE 105* 107* 63* 349* 388*  BUN '14 15 12 9  '$ --   CREATININE 0.96 1.00 0.79 0.99  --   CALCIUM 9.4  --  8.7* 9.2  --      GFR: Estimated Creatinine Clearance: 45.9 mL/min (by C-G formula  based on SCr of 0.99 mg/dL).  Liver Function Tests: Recent Labs  Lab 01/02/21 1733  AST 19  ALT 19  ALKPHOS 76  BILITOT 1.0  PROT 6.3*  ALBUMIN 3.4*     CBG: Recent Labs  Lab 01/05/21 0006 01/05/21 0406 01/05/21 0749 01/05/21 1156 01/05/21 1541  GLUCAP 244* 221* 168* 259* 250*      No results found for this or any previous visit (from the past 240 hour(s)).       Radiology Studies: No results found.      Scheduled Meds:  amLODipine  2.5 mg Oral Daily   citalopram  40 mg Oral Daily   enoxaparin (LOVENOX) injection  40 mg Subcutaneous Q24H   EPINEPHrine  0.3 mg Intramuscular Once   EPINEPHrine  0.3 mg Intramuscular Once   insulin aspart  0-9 Units Subcutaneous TID WC   insulin glargine-yfgn  10 Units Subcutaneous Daily   isosorbide mononitrate  30 mg Oral Daily   latanoprost  1 drop Both Eyes QHS   losartan  50 mg Oral Daily   pantoprazole  40 mg Oral BID AC   predniSONE  5 mg Oral Q breakfast   Racepinephrine HCl  0.5 mL Nebulization Once   Continuous Infusions:  lactated ringers 50 mL/hr at 01/05/21 1049     LOS: 3 days      Bonnell Public, MD [facility]   To contact the attending provider between 7A-7P or the covering provider during after hours 7P-7A, please log into the web site [email or web address] and access using universal [place] password for that web site. If you do not have the password, please call the hospital operator.  01/05/2021, 9:22 PM

## 2021-01-05 NOTE — Progress Notes (Signed)
Assessment & Plan: HD#4 - partial SBO likely due to adhesions - h/o RYGB 2007; incisional hernia repair x2 with mesh with recurrence - Full liquid diet - large BM this AM - advance to soft diet - encouraged ambulation - anticipate discharge home tomorrow if diet tolerated   FEN: Full liquid diet - begin soft diet ID: none VTE: lovenox   DM HTN   Patient comfortable, denies nausea or emesis.  Encouraged to ambulate in halls today.  Will follow.        [name], MD       [facility]       Office: [phone]   Chief Complaint: Partial SBO, recurrent  Subjective: Patient in bed on tablet.  No complaints.  Large BM this AM.  Objective: Vital signs in last 24 hours: Temp:  [98 F (36.7 C)-98.6 F (37 C)] 98 F (36.7 C) (08/07 0409) Pulse Rate:  [69-81] 73 (08/07 0409) Resp:  [17-18] 17 (08/07 0409) BP: (143-172)/(54-71) 172/71 (08/07 0409) SpO2:  [96 %-100 %] 100 % (08/07 0409) Last BM Date: 01/04/21  Intake/Output from previous day: 08/06 0701 - 08/07 0700 In: 480 [P.O.:480] Out: 4 [Urine:4] Intake/Output this shift: No intake/output data recorded.  Physical Exam: HEENT - sclerae clear, mucous membranes moist Neck - soft Abdomen - protuberant, soft; BS present; non-tender Neuro - alert & oriented, no focal deficits  Lab Results:  Recent Labs    01/03/21 0519 01/04/21 0025  WBC 8.0 6.7  HGB 12.0 12.7  HCT 38.9 41.8  PLT 268 316   BMET Recent Labs    01/03/21 0519 01/04/21 0025 01/05/21 0824  NA 141 136  --   K 3.4* 4.0  --   CL 106 102  --   CO2 30 25  --   GLUCOSE 63* 349* 388*  BUN 12 9  --   CREATININE 0.79 0.99  --   CALCIUM 8.7* 9.2  --    PT/INR No results for input(s): LABPROT, INR in the last 72 hours. Comprehensive Metabolic Panel:    Component Value Date/Time   NA 136 01/04/2021 0025   NA 141 01/03/2021 0519   NA 140 09/27/2017 1511   NA 139 11/17/2016 1054   K 4.0 01/04/2021 0025   K 3.4 (L)  01/03/2021 0519   CL 102 01/04/2021 0025   CL 106 01/03/2021 0519   CO2 25 01/04/2021 0025   CO2 30 01/03/2021 0519   BUN 9 01/04/2021 0025   BUN 12 01/03/2021 0519   BUN 17 09/27/2017 1511   BUN 24 11/17/2016 1054   CREATININE 0.99 01/04/2021 0025   CREATININE 0.79 01/03/2021 0519   CREATININE 0.85 10/08/2020 0855   CREATININE 0.86 06/06/2020 1001   GLUCOSE 388 (H) 01/05/2021 0824   GLUCOSE 349 (H) 01/04/2021 0025   CALCIUM 9.2 01/04/2021 0025   CALCIUM 8.7 (L) 01/03/2021 0519   AST 19 01/02/2021 1733   AST 14 10/08/2020 0855   ALT 19 01/02/2021 1733   ALT 15 10/08/2020 0855   ALKPHOS 76 01/02/2021 1733   ALKPHOS 80 01/20/2016 1044   BILITOT 1.0 01/02/2021 1733   BILITOT 0.5 10/08/2020 0855   BILITOT 0.5 08/07/2015 0854   BILITOT 0.4 04/29/2015 0940   PROT 6.3 (L) 01/02/2021 1733   PROT 5.9 (L) 10/08/2020 0855   PROT 6.2 08/07/2015 0854   PROT 5.6 (L) 04/29/2015 0940   ALBUMIN 3.4 (L) 01/02/2021 1733   ALBUMIN 3.7 01/20/2016 1044  ALBUMIN 3.7 08/07/2015 0854   ALBUMIN 3.4 (L) 04/29/2015 0940    Studies/Results: No results found.    [name] 01/05/2021   Patient ID: [name], female   DOB: 1941/05/20, 80 y.o.   MRN: [id number]

## 2021-01-06 DIAGNOSIS — E1121 Type 2 diabetes mellitus with diabetic nephropathy: Secondary | ICD-10-CM | POA: Diagnosis not present

## 2021-01-06 DIAGNOSIS — I10 Essential (primary) hypertension: Secondary | ICD-10-CM | POA: Diagnosis not present

## 2021-01-06 DIAGNOSIS — K56609 Unspecified intestinal obstruction, unspecified as to partial versus complete obstruction: Secondary | ICD-10-CM | POA: Diagnosis not present

## 2021-01-06 DIAGNOSIS — Z7952 Long term (current) use of systemic steroids: Secondary | ICD-10-CM | POA: Diagnosis not present

## 2021-01-06 LAB — CBC WITH DIFFERENTIAL/PLATELET
Abs Immature Granulocytes: 0.02 10*3/uL (ref 0.00–0.07)
Basophils Absolute: 0.1 10*3/uL (ref 0.0–0.1)
Basophils Relative: 1 %
Eosinophils Absolute: 0.2 10*3/uL (ref 0.0–0.5)
Eosinophils Relative: 2 %
HCT: 38.3 % (ref 36.0–46.0)
Hemoglobin: 12.1 g/dL (ref 12.0–15.0)
Immature Granulocytes: 0 %
Lymphocytes Relative: 36 %
Lymphs Abs: 2.8 10*3/uL (ref 0.7–4.0)
MCH: 29.5 pg (ref 26.0–34.0)
MCHC: 31.6 g/dL (ref 30.0–36.0)
MCV: 93.4 fL (ref 80.0–100.0)
Monocytes Absolute: 0.5 10*3/uL (ref 0.1–1.0)
Monocytes Relative: 6 %
Neutro Abs: 4.4 10*3/uL (ref 1.7–7.7)
Neutrophils Relative %: 55 %
Platelets: 279 10*3/uL (ref 150–400)
RBC: 4.1 MIL/uL (ref 3.87–5.11)
RDW: 13.8 % (ref 11.5–15.5)
WBC: 7.8 10*3/uL (ref 4.0–10.5)
nRBC: 0 % (ref 0.0–0.2)

## 2021-01-06 LAB — COMPREHENSIVE METABOLIC PANEL
ALT: 18 U/L (ref 0–44)
AST: 15 U/L (ref 15–41)
Albumin: 2.8 g/dL — ABNORMAL LOW (ref 3.5–5.0)
Alkaline Phosphatase: 59 U/L (ref 38–126)
Anion gap: 7 (ref 5–15)
BUN: 14 mg/dL (ref 8–23)
CO2: 33 mmol/L — ABNORMAL HIGH (ref 22–32)
Calcium: 9 mg/dL (ref 8.9–10.3)
Chloride: 100 mmol/L (ref 98–111)
Creatinine, Ser: 0.93 mg/dL (ref 0.44–1.00)
GFR, Estimated: >60 mL/min (ref >60)
Glucose, Bld: 175 mg/dL — ABNORMAL HIGH (ref 70–99)
Potassium: 3.7 mmol/L (ref 3.5–5.1)
Sodium: 140 mmol/L (ref 135–145)
Total Bilirubin: 0.8 mg/dL (ref 0.3–1.2)
Total Protein: 5.5 g/dL — ABNORMAL LOW (ref 6.5–8.1)

## 2021-01-06 LAB — GLUCOSE, CAPILLARY
Glucose-Capillary: 172 mg/dL — ABNORMAL HIGH (ref 70–99)
Glucose-Capillary: 172 mg/dL — ABNORMAL HIGH (ref 70–99)
Glucose-Capillary: 176 mg/dL — ABNORMAL HIGH (ref 70–99)
Glucose-Capillary: 205 mg/dL — ABNORMAL HIGH (ref 70–99)
Glucose-Capillary: 278 mg/dL — ABNORMAL HIGH (ref 70–99)
Glucose-Capillary: 282 mg/dL — ABNORMAL HIGH (ref 70–99)
Glucose-Capillary: 331 mg/dL — ABNORMAL HIGH (ref 70–99)

## 2021-01-06 LAB — LIPID PANEL
Cholesterol: 175 mg/dL (ref 0–200)
HDL: 45 mg/dL (ref >40)
LDL Cholesterol: 73 mg/dL (ref 0–99)
Total CHOL/HDL Ratio: 3.9 RATIO
Triglycerides: 283 mg/dL — ABNORMAL HIGH (ref <150)
VLDL: 57 mg/dL — ABNORMAL HIGH (ref 0–40)

## 2021-01-06 LAB — MAGNESIUM: Magnesium: 1.6 mg/dL — ABNORMAL LOW (ref 1.7–2.4)

## 2021-01-06 LAB — PHOSPHORUS: Phosphorus: 4 mg/dL (ref 2.5–4.6)

## 2021-01-06 LAB — SARS CORONAVIRUS 2 (TAT 6-24 HRS): SARS Coronavirus 2: NEGATIVE

## 2021-01-06 MED ORDER — MAGNESIUM SULFATE 2 GM/50ML IV SOLN
2.0000 g | Freq: Once | INTRAVENOUS | Status: AC
  Administered 2021-01-06: 2 g via INTRAVENOUS
  Filled 2021-01-06: qty 50

## 2021-01-06 MED ORDER — INSULIN ASPART 100 UNIT/ML IJ SOLN
0.0000 [IU] | INTRAMUSCULAR | Status: DC
  Administered 2021-01-07: 4 [IU] via SUBCUTANEOUS
  Administered 2021-01-07: 7 [IU] via SUBCUTANEOUS
  Administered 2021-01-07: 3 [IU] via SUBCUTANEOUS
  Administered 2021-01-07: 15 [IU] via SUBCUTANEOUS
  Administered 2021-01-07: 11 [IU] via SUBCUTANEOUS
  Administered 2021-01-08 (×2): 7 [IU] via SUBCUTANEOUS

## 2021-01-06 MED ORDER — INSULIN GLARGINE-YFGN 100 UNIT/ML ~~LOC~~ SOLN
15.0000 [IU] | Freq: Every day | SUBCUTANEOUS | Status: DC
  Administered 2021-01-07 – 2021-01-10 (×4): 15 [IU] via SUBCUTANEOUS
  Filled 2021-01-06 (×4): qty 0.15

## 2021-01-06 NOTE — Care Management Important Message (Signed)
Important Message  Patient Details  Name: [name] MRN: [id number] Date of Birth: [birth year]   Medicare Important Message Given:  Yes     Orbie Pyo 01/06/2021, 4:48 PM

## 2021-01-06 NOTE — Progress Notes (Signed)
PROGRESS NOTE    [name]  [id number] DOB: 1940-11-23 DOA: 01/02/2021 PCP: Wardell Honour, MD   Brief Narrative:  Patient is an 80 year old WF PMHx HTN, HLD, DM2, prior Roux-en-y gastric bypass in 2007, 2 ventral hernia repairs with recurrence after each.    Admitted with query small bowel obstruction.  Patient tells me that she has had bowel movement today.  Patient has also broken wind.  Surgical team is assisting with patient's care.  Patient is currently on clear liquids.  01/05/2021: Patient continues to improve.  Patient has continued to have bowel movements.  Diet is being advanced as tolerated.  Patient may likely be discharged back home tomorrow.   Subjective: A/O x4    Assessment & Plan:  Covid vaccination;  Principal Problem:   SBO (small bowel obstruction) (HCC) Active Problems:   Controlled diabetes mellitus with nephropathy ([place])   Essential hypertension, benign   Recurrent ventral incisional hernia   Current chronic use of systemic steroids  SBO: -Likely related to adhesions. -General surgery is assisting with patient's care. -Patient has had bowel movement today.  Patient is breaking wind. -Patient is currently on clear liquids. -Patient is improving. January 05, 2021: Patient continues to improve.  Please see above documentation.     DM type II controlled with Hyperglycemia -8/5 hemoglobin A1c= 6.6 -8/8 increase insulin glargine 15 units daily -8/8 increase resistant SSI  Essential HTN -Amlodipine 2.5 mg daily -Imdur 30 mg daily  Current chronic use of systemic steroids  -Prednisone 5 mg daily  Hypomagnesmia - Magnesium goal> 2 - Magnesium IV 2 g   DVT prophylaxis: Lovenox Code Status: DNR Family Communication:  Status is: Inpatient    Dispo: The patient is from: Home              Anticipated d/c is to: Home              Anticipated d/c date is: 1 day              Patient currently is not medically stable to  d/c.      Consultants:    Procedures/Significant Events:     I have personally reviewed and interpreted all radiology studies and my findings are as above.  VENTILATOR SETTINGS:    Cultures 8/8 SARS coronavirus negative  Antimicrobials: Anti-infectives (From admission, onward)    None         Devices    LINES / TUBES:      Continuous Infusions:  lactated ringers 50 mL/hr at 01/05/21 2157     Objective: Vitals:   01/06/21 1034 01/06/21 1153 01/06/21 1230 01/06/21 1331  BP: (!) 166/67 (!) 164/65 (!) 174/83 (!) 145/47  Pulse: 75 (!) 54    Resp:  16    Temp:  97.8 F (36.6 C)    TempSrc:  Oral    SpO2:  92%    Weight:      Height:        Intake/Output Summary (Last 24 hours) at 01/06/2021 1419 Last data filed at 01/06/2021 1331 Gross per 24 hour  Intake 540 ml  Output --  Net 540 ml   Filed Weights   01/02/21 1727  Weight: 90.7 kg    Examination:  General: A/O x4 No acute respiratory distress Eyes: negative scleral hemorrhage, negative anisocoria, negative icterus ENT: Negative Runny nose, negative gingival bleeding, Neck:  Negative scars, masses, torticollis, lymphadenopathy, JVD Lungs: Clear to auscultation bilaterally without wheezes or crackles Cardiovascular: Regular  rate and rhythm without murmur gallop or rub normal S1 and S2 Abdomen: negative abdominal pain, nondistended, positive soft, bowel sounds, no rebound, no ascites, no appreciable mass Extremities: No significant cyanosis, clubbing, or edema bilateral lower extremities Skin: Negative rashes, lesions, ulcers Psychiatric:  Negative depression, negative anxiety, negative fatigue, negative mania  Central nervous system:  Cranial nerves II through XII intact, tongue/uvula midline, all extremities muscle strength 5/5, sensation intact throughout, negative dysarthria, negative expressive aphasia, negative receptive aphasia.  .     Data Reviewed: Care during the described time  interval was provided by me .  I have reviewed this patient's available data, including medical history, events of note, physical examination, and all test results as part of my evaluation.   CBC: Recent Labs  Lab 01/02/21 1733 01/02/21 1750 01/03/21 0519 01/04/21 0025 01/06/21 0816  WBC 13.5*  --  8.0 6.7 7.8  NEUTROABS 11.1*  --   --   --  4.4  HGB 13.4 13.9 12.0 12.7 12.1  HCT 43.1 41.0 38.9 41.8 38.3  MCV 94.5  --  94.4 95.0 93.4  PLT 333  --  268 316 [id number]   Basic Metabolic Panel: Recent Labs  Lab 01/02/21 1733 01/02/21 1750 01/03/21 0519 01/04/21 0025 01/05/21 0824 01/06/21 0816  NA 141 141 141 136  --  140  K 3.9 3.8 3.4* 4.0  --  3.7  CL 105 103 106 102  --  100  CO2 29  --  30 25  --  33*  GLUCOSE 105* 107* 63* 349* 388* 175*  BUN '14 15 12 9  '$ --  14  CREATININE 0.96 1.00 0.79 0.99  --  0.93  CALCIUM 9.4  --  8.7* 9.2  --  9.0  MG  --   --   --   --   --  1.6*  PHOS  --   --   --   --   --  4.0   GFR: Estimated Creatinine Clearance: 48.8 mL/min (by C-G formula based on SCr of 0.93 mg/dL). Liver Function Tests: Recent Labs  Lab 01/02/21 1733 01/06/21 0816  AST 19 15  ALT 19 18  ALKPHOS 76 59  BILITOT 1.0 0.8  PROT 6.3* 5.5*  ALBUMIN 3.4* 2.8*   Recent Labs  Lab 01/02/21 1733  LIPASE 27   No results for input(s): AMMONIA in the last 168 hours. Coagulation Profile: No results for input(s): INR, PROTIME in the last 168 hours. Cardiac Enzymes: No results for input(s): CKTOTAL, CKMB, CKMBINDEX, TROPONINI in the last 168 hours. BNP (last 3 results) No results for input(s): PROBNP in the last 8760 hours. HbA1C: No results for input(s): HGBA1C in the last 72 hours. CBG: Recent Labs  Lab 01/06/21 0010 01/06/21 0406 01/06/21 0835 01/06/21 1035 01/06/21 1151  GLUCAP 172* 172* 176* 282* 278*   Lipid Profile: Recent Labs    01/06/21 0816  CHOL 175  HDL 45  LDLCALC 73  TRIG 283*  CHOLHDL 3.9   Thyroid Function Tests: No results for  input(s): TSH, T4TOTAL, FREET4, T3FREE, THYROIDAB in the last 72 hours. Anemia Panel: No results for input(s): VITAMINB12, FOLATE, FERRITIN, TIBC, IRON, RETICCTPCT in the last 72 hours. Urine analysis:    Component Value Date/Time   COLORURINE YELLOW 01/02/2021 2213   [place] 01/02/2021 2213   LABSPEC 1.015 01/02/2021 2213   PHURINE 5.0 01/02/2021 2213   GLUCOSEU NEGATIVE 01/02/2021 2213   HGBUR NEGATIVE 01/02/2021 2213   BILIRUBINUR NEGATIVE 01/02/2021 2213  [place] NEGATIVE 01/02/2021 2213   PROTEINUR 30 (A) 01/02/2021 2213   UROBILINOGEN 0.2 10/19/2014 2035   NITRITE NEGATIVE 01/02/2021 2213   LEUKOCYTESUR TRACE (A) 01/02/2021 2213   Sepsis Labs: '@LABRCNTIP'$ (procalcitonin:4,lacticidven:4)  )No results found for this or any previous visit (from the past 240 hour(s)).       Radiology Studies: No results found.      Scheduled Meds:  amLODipine  2.5 mg Oral Daily   citalopram  40 mg Oral Daily   enoxaparin (LOVENOX) injection  40 mg Subcutaneous Q24H   EPINEPHrine  0.3 mg Intramuscular Once   EPINEPHrine  0.3 mg Intramuscular Once   insulin aspart  0-9 Units Subcutaneous TID WC   insulin glargine-yfgn  10 Units Subcutaneous Daily   isosorbide mononitrate  30 mg Oral Daily   latanoprost  1 drop Both Eyes QHS   losartan  50 mg Oral Daily   pantoprazole  40 mg Oral BID AC   predniSONE  5 mg Oral Q breakfast   Racepinephrine HCl  0.5 mL Nebulization Once   Continuous Infusions:  lactated ringers 50 mL/hr at 01/05/21 2157     LOS: 4 days   The patient is critically ill with multiple organ systems failure and requires high complexity decision making for assessment and support, frequent evaluation and titration of therapies, application of advanced monitoring technologies and extensive interpretation of multiple databases. Critical Care Time devoted to patient care services described in this note  Time spent: 40 minutes     Mialynn Shelvin, Geraldo Docker, MD [facility]   If 7PM-7AM, please contact night-coverage 01/06/2021, 2:19 PM

## 2021-01-06 NOTE — Plan of Care (Signed)

## 2021-01-06 NOTE — Progress Notes (Signed)
Inpatient Diabetes Program Recommendations  AACE/ADA: New Consensus Statement on Inpatient Glycemic Control (2015)  Target Ranges:  Prepandial:   less than 140 mg/dL      Peak postprandial:   less than 180 mg/dL (1-2 hours)      Critically ill patients:  140 - 180 mg/dL   Lab Results  Component Value Date   GLUCAP 176 (H) 01/06/2021   HGBA1C 6.6 (H) 01/03/2021    Review of Glycemic Control Results for MADSION, WORDLAW (MRN [id number]) as of 01/06/2021 09:47  Ref. Range 01/05/2021 07:49 01/05/2021 11:56 01/05/2021 15:41 01/05/2021 21:21 01/06/2021 00:10 01/06/2021 04:06 01/06/2021 08:35  Glucose-Capillary Latest Ref Range: 70 - 99 mg/dL 168 (H)  Novolog 2 units 259 (H)  Novolog 5 units 250 (H)  Novolog 3 units 242 (H) 172 (H) 172 (H) 176 (H)  Novolog 5 units   Diabetes history: DM 2 Outpatient Diabetes medications: Glipizide 10 mg bid, NPH 40 units bid, PO prednisone 5 mg Daily Current orders for Inpatient glycemic control:  Semglee 10 units Daily Novolog 0-9 units tid  PO prednisone 5 mg Daily  Inpatient Diabetes Program Recommendations:    -  consider adding Novolog 3 units tid meal coverage if eating >50% of meals -  add Novolog hs scale  Thanks,  Tama Headings RN, MSN, BC-ADM Inpatient Diabetes Coordinator Team Pager [phone] (8a-5p)

## 2021-01-06 NOTE — Progress Notes (Signed)
   Progress Note     Subjective: Sitting on bedside commode at time of rounds. Tolerating soft diet without nausea/emesis, abdominal pain. Passing flatus. Having bowel movements.   Objective: Vital signs in last 24 hours: Temp:  [97.9 F (36.6 C)-98.7 F (37.1 C)] 98.5 F (36.9 C) (08/08 0746) Pulse Rate:  [51-79] 51 (08/08 0746) Resp:  [16-18] 17 (08/08 0746) BP: (149-195)/(54-97) 195/97 (08/08 0746) SpO2:  [93 %-98 %] 98 % (08/08 0746) Last BM Date: 01/04/21  Intake/Output from previous day: 08/07 0701 - 08/08 0700 In: 580 [P.O.:580] Out: -  Intake/Output this shift: No intake/output data recorded.  PE: General: pleasant, WD,female who is sitting on bedside commode in NAD HEENT: head is normocephalic, atraumatic. Mouth is pink and moist Heart: regular, rate, and rhythm. Palpable radial pulses bilaterally Lungs: Respiratory effort nonlabored on room air Abd: soft, NT, ND, +BS, large ventral hernia soft without incarceration MSK: all 4 extremities are symmetrical with no cyanosis, clubbing, or edema. Skin: warm and dry with no masses, lesions, or rashes Psych: A&Ox3 with an appropriate affect.    Lab Results:  Recent Labs    01/04/21 0025 01/06/21 0816  WBC 6.7 7.8  HGB 12.7 12.1  HCT 41.8 38.3  PLT 316 279   BMET Recent Labs    01/04/21 0025 01/05/21 0824  NA 136  --   K 4.0  --   CL 102  --   CO2 25  --   GLUCOSE 349* 388*  BUN 9  --   CREATININE 0.99  --   CALCIUM 9.2  --    PT/INR No results for input(s): LABPROT, INR in the last 72 hours. CMP     Component Value Date/Time   NA 136 01/04/2021 0025   NA 140 09/27/2017 1511   K 4.0 01/04/2021 0025   CL 102 01/04/2021 0025   CO2 25 01/04/2021 0025   GLUCOSE 388 (H) 01/05/2021 0824   BUN 9 01/04/2021 0025   BUN 17 09/27/2017 1511   CREATININE 0.99 01/04/2021 0025   CREATININE 0.85 10/08/2020 0855   CALCIUM 9.2 01/04/2021 0025   PROT 6.3 (L) 01/02/2021 1733   PROT 6.2 08/07/2015 0854    ALBUMIN 3.4 (L) 01/02/2021 1733   ALBUMIN 3.7 08/07/2015 0854   AST 19 01/02/2021 1733   ALT 19 01/02/2021 1733   ALKPHOS 76 01/02/2021 1733   BILITOT 1.0 01/02/2021 1733   BILITOT 0.5 08/07/2015 0854   GFRNONAA 57 (L) 01/04/2021 0025   GFRNONAA 63 06/06/2020 1001   GFRAA 73 06/06/2020 1001   Lipase     Component Value Date/Time   LIPASE 27 01/02/2021 1733       Studies/Results: No results found.  Anti-infectives: Anti-infectives (From admission, onward)    None        Assessment/Plan  HD#5 - partial SBO likely due to adhesions - h/o RYGB 2007; incisional hernia repair x2 with mesh with recurrence - tolerating soft diet. Having bowel function - stable for discharge from surgical perspective - we will sign off. Please reach out for any further needs   FEN: soft diet ID: none VTE: lovenox   DM HTN     LOS: 4 days    Winferd Humphrey, [facility] 01/06/2021, 9:04 AM Please see Amion for pager number during day hours 7:00am-4:30pm

## 2021-01-07 ENCOUNTER — Inpatient Hospital Stay (HOSPITAL_COMMUNITY): Payer: Medicare Other

## 2021-01-07 DIAGNOSIS — I10 Essential (primary) hypertension: Secondary | ICD-10-CM | POA: Diagnosis not present

## 2021-01-07 DIAGNOSIS — K5900 Constipation, unspecified: Secondary | ICD-10-CM

## 2021-01-07 DIAGNOSIS — K56609 Unspecified intestinal obstruction, unspecified as to partial versus complete obstruction: Secondary | ICD-10-CM | POA: Diagnosis not present

## 2021-01-07 DIAGNOSIS — E1121 Type 2 diabetes mellitus with diabetic nephropathy: Secondary | ICD-10-CM | POA: Diagnosis not present

## 2021-01-07 DIAGNOSIS — Z7952 Long term (current) use of systemic steroids: Secondary | ICD-10-CM | POA: Diagnosis not present

## 2021-01-07 LAB — GLUCOSE, CAPILLARY
Glucose-Capillary: 103 mg/dL — ABNORMAL HIGH (ref 70–99)
Glucose-Capillary: 131 mg/dL — ABNORMAL HIGH (ref 70–99)
Glucose-Capillary: 155 mg/dL — ABNORMAL HIGH (ref 70–99)
Glucose-Capillary: 246 mg/dL — ABNORMAL HIGH (ref 70–99)
Glucose-Capillary: 271 mg/dL — ABNORMAL HIGH (ref 70–99)
Glucose-Capillary: 300 mg/dL — ABNORMAL HIGH (ref 70–99)
Glucose-Capillary: 350 mg/dL — ABNORMAL HIGH (ref 70–99)
Glucose-Capillary: 61 mg/dL — ABNORMAL LOW (ref 70–99)

## 2021-01-07 MED ORDER — SORBITOL 70 % SOLN
300.0000 mL | TOPICAL_OIL | Freq: Once | ORAL | Status: AC
  Administered 2021-01-07: 300 mL via RECTAL
  Filled 2021-01-07: qty 90

## 2021-01-07 MED ORDER — INSULIN ASPART 100 UNIT/ML IJ SOLN
5.0000 [IU] | Freq: Three times a day (TID) | INTRAMUSCULAR | Status: DC
  Administered 2021-01-08 (×2): 5 [IU] via SUBCUTANEOUS

## 2021-01-07 NOTE — Progress Notes (Signed)
Off unit for CT.

## 2021-01-07 NOTE — Progress Notes (Signed)
PROGRESS NOTE    [name]  [id number] DOB: [birth year] DOA: 01/02/2021 PCP: Wardell Honour, MD   Brief Narrative:  Patient is an 80 year old WF PMHx HTN, HLD, DM2, prior Roux-en-y gastric bypass in 2007, 2 ventral hernia repairs with recurrence after each.    Admitted with query small bowel obstruction.  Patient tells me that she has had bowel movement today.  Patient has also broken wind.  Surgical team is assisting with patient's care.  Patient is currently on clear liquids.  01/05/2021: Patient continues to improve.  Patient has continued to have bowel movements.  Diet is being advanced as tolerated.  Patient may likely be discharged back home tomorrow.   Subjective: 8/9 afebrile overnight, A/O x4.  Initially patient was to be discharged back began to have refractory nausea and vomiting this AM.   Assessment & Plan:  Covid vaccination; unvaccinated does not want to be vaccinated.  Principal Problem:   SBO (small bowel obstruction) (HCC) Active Problems:   Controlled diabetes mellitus with nephropathy ([place])   Essential hypertension, benign   Recurrent ventral incisional hernia   Current chronic use of systemic steroids  SBO: -Likely related to adhesions. -General surgery is assisting with patient's care. -Patient has had bowel movement today.  Patient is breaking wind. -Patient is currently on clear liquids. -Patient is improving. January 05, 2021: Patient continues to improve.  Please see above documentation.   -8/9 patient now with increased nausea and vomiting this a.m.  Obtain CT abdomen pelvis SBO vs ileus reoccurrence.  If scan shows recurrence will reconsult surgery -8/9 CT abdomen negative evidence of SBO or ileus.  Continue evidence of cholelithiasis this appears chronic. - 8/9 will restart patient on full liquid diet.  And advance rapidly if patient tolerates     -If patient begins to have SSX N/V add Reglan  Constipation - 8/9 smog enema  DM type II  controlled with Hyperglycemia -8/5 hemoglobin A1c= 6.6 -8/8 increase insulin glargine 15 units daily -8/8 increase resistant SSI -8/9 NovoLog 5 units qac  Essential HTN -Amlodipine 2.5 mg daily -Imdur 30 mg daily  Current chronic use of systemic steroids  -Prednisone 5 mg daily  Hypomagnesmia - Magnesium goal> 2 - Magnesium IV 2 g   DVT prophylaxis: Lovenox Code Status: DNR Family Communication:  Status is: Inpatient    Dispo: The patient is from: Home              Anticipated d/c is to: Home              Anticipated d/c date is: 1 day              Patient currently is not medically stable to d/c.      Consultants:  PCCM CCS  Procedures/Significant Events:  8/4 CT abdomen pelvis W0 contrast:Several loops of dilated small bowel along the jejunojejunosotmy and small bowel resection anastomotic sutures in the mid abdomen with associated luminal fecalized material in a patient status post Roux-en-Y gastric bypass and small bowel resection. Findings may represent early/partial small bowel obstruction possibly due to adhesions. No bowel perforation. No pneumatosis. 2. Ventral wall hernia repair with mesh with diastasis rectus and recurrence of the ventral wall hernia with a large abdominal defect. The hernia contains a loop of transverse colon as well as several loops of small bowel, including the loops involving the obstruction. The hernia is not the cause of the obstruction. 3. Interval increase in size of several indeterminate renal lesions. Recommend  nonemergent MRI renal protocol for further evaluation. 4. Cholelithiasis with no findings of acute cholecystitis. 5. Interval increase in size (compared to 2010 in 2016) of a lobulated partially calcified soft tissue density within the upper abdomen measuring up to 4.9 x 6 cm. Finding may represent postsurgical changes. 8/9 CT abdomen pelvis W0 contrast:No acute findings in the abdomen or pelvis. Specifically,  negative SBO. - The distended small bowel loops seen on the previous study included areas of anastomosis and most likely reflected atony related to the anastomotic sites. Distension in these regions seen previously has decreased in the interval. -Ventral hernia incompletely visualized on today's study. No complicating features within the visualized portion. -Cholelithiasis.   I have personally reviewed and interpreted all radiology studies and my findings are as above.  VENTILATOR SETTINGS:    Cultures 8/8 SARS coronavirus negative  Antimicrobials: Anti-infectives (From admission, onward)    None         Devices    LINES / TUBES:      Continuous Infusions:  lactated ringers 50 mL/hr at 01/06/21 1809     Objective: Vitals:   01/06/21 2058 01/07/21 0034 01/07/21 0401 01/07/21 0843  BP: (!) 127/49 (!) 146/52 (!) 160/81 (!) 155/53  Pulse: 78 (!) 50 71 72  Resp: '18 18 17 17  '$ Temp: 98.8 F (37.1 C) 98.4 F (36.9 C) 97.8 F (36.6 C) 98.2 F (36.8 C)  TempSrc: Oral Oral Oral Oral  SpO2: 93% 94% 92% 94%  Weight:      Height:        Intake/Output Summary (Last 24 hours) at 01/07/2021 1320 Last data filed at 01/07/2021 0403 Gross per 24 hour  Intake 4578.87 ml  Output 1 ml  Net 4577.87 ml    Filed Weights   01/02/21 1727  Weight: 90.7 kg    Examination:  General: A/O x4 No acute respiratory distress Eyes: negative scleral hemorrhage, negative anisocoria, negative icterus ENT: Negative Runny nose, negative gingival bleeding, Neck:  Negative scars, masses, torticollis, lymphadenopathy, JVD Lungs: Clear to auscultation bilaterally without wheezes or crackles Cardiovascular: Regular rate and rhythm without murmur gallop or rub normal S1 and S2 Abdomen: Positive abdominal pain LUQ, nondistended, hypoactive bowel sounds, no rebound, no ascites, no appreciable mass Extremities: No significant cyanosis, clubbing, or edema bilateral lower extremities Skin:  Negative rashes, lesions, ulcers Psychiatric:  Negative depression, negative anxiety, negative fatigue, negative mania  Central nervous system:  Cranial nerves II through XII intact, tongue/uvula midline, all extremities muscle strength 5/5, sensation intact throughout, negative dysarthria, negative expressive aphasia, negative receptive aphasia.  .     Data Reviewed: Care during the described time interval was provided by me .  I have reviewed this patient's available data, including medical history, events of note, physical examination, and all test results as part of my evaluation.   CBC: Recent Labs  Lab 01/02/21 1733 01/02/21 1750 01/03/21 0519 01/04/21 0025 01/06/21 0816  WBC 13.5*  --  8.0 6.7 7.8  NEUTROABS 11.1*  --   --   --  4.4  HGB 13.4 13.9 12.0 12.7 12.1  HCT 43.1 41.0 38.9 41.8 38.3  MCV 94.5  --  94.4 95.0 93.4  PLT 333  --  268 316 [id number]    Basic Metabolic Panel: Recent Labs  Lab 01/02/21 1733 01/02/21 1750 01/03/21 0519 01/04/21 0025 01/05/21 0824 01/06/21 0816  NA 141 141 141 136  --  140  K 3.9 3.8 3.4* 4.0  --  3.7  CL  105 103 106 102  --  100  CO2 29  --  30 25  --  33*  GLUCOSE 105* 107* 63* 349* 388* 175*  BUN '14 15 12 9  '$ --  14  CREATININE 0.96 1.00 0.79 0.99  --  0.93  CALCIUM 9.4  --  8.7* 9.2  --  9.0  MG  --   --   --   --   --  1.6*  PHOS  --   --   --   --   --  4.0    GFR: Estimated Creatinine Clearance: 48.8 mL/min (by C-G formula based on SCr of 0.93 mg/dL). Liver Function Tests: Recent Labs  Lab 01/02/21 1733 01/06/21 0816  AST 19 15  ALT 19 18  ALKPHOS 76 59  BILITOT 1.0 0.8  PROT 6.3* 5.5*  ALBUMIN 3.4* 2.8*    Recent Labs  Lab 01/02/21 1733  LIPASE 27    No results for input(s): AMMONIA in the last 168 hours. Coagulation Profile: No results for input(s): INR, PROTIME in the last 168 hours. Cardiac Enzymes: No results for input(s): CKTOTAL, CKMB, CKMBINDEX, TROPONINI in the last 168 hours. BNP (last 3  results) No results for input(s): PROBNP in the last 8760 hours. HbA1C: No results for input(s): HGBA1C in the last 72 hours. CBG: Recent Labs  Lab 01/07/21 0034 01/07/21 0040 01/07/21 0354 01/07/21 0804 01/07/21 1155  GLUCAP 246* 271* 155* 103* 350*    Lipid Profile: Recent Labs    01/06/21 0816  CHOL 175  HDL 45  LDLCALC 73  TRIG 283*  CHOLHDL 3.9    Thyroid Function Tests: No results for input(s): TSH, T4TOTAL, FREET4, T3FREE, THYROIDAB in the last 72 hours. Anemia Panel: No results for input(s): VITAMINB12, FOLATE, FERRITIN, TIBC, IRON, RETICCTPCT in the last 72 hours. Urine analysis:    Component Value Date/Time   COLORURINE YELLOW 01/02/2021 2213   APPEARANCEUR CLEAR 01/02/2021 2213   LABSPEC 1.015 01/02/2021 2213   PHURINE 5.0 01/02/2021 2213   GLUCOSEU NEGATIVE 01/02/2021 2213   HGBUR NEGATIVE 01/02/2021 2213   BILIRUBINUR NEGATIVE 01/02/2021 2213   KETONESUR NEGATIVE 01/02/2021 2213   PROTEINUR 30 (A) 01/02/2021 2213   UROBILINOGEN 0.2 10/19/2014 2035   NITRITE NEGATIVE 01/02/2021 2213   LEUKOCYTESUR TRACE (A) 01/02/2021 2213   Sepsis Labs: '@LABRCNTIP'$ (procalcitonin:4,lacticidven:4)  ) Recent Results (from the past 240 hour(s))  SARS CORONAVIRUS 2 (TAT 6-24 HRS) Nasopharyngeal Nasopharyngeal Swab     Status: None   Collection Time: 01/06/21  2:05 PM   Specimen: Nasopharyngeal Swab  Result Value Ref Range Status   SARS Coronavirus 2 NEGATIVE NEGATIVE Final    Comment: (NOTE) SARS-CoV-2 target nucleic acids are NOT DETECTED.  The SARS-CoV-2 RNA is generally detectable in upper and lower respiratory specimens during the acute phase of infection. Negative results do not preclude SARS-CoV-2 infection, do not rule out co-infections with other pathogens, and should not be used as the sole basis for treatment or other patient management decisions. Negative results must be combined with clinical observations, patient history, and epidemiological  information. The expected result is Negative.  Fact Sheet for Patients: SugarRoll.be  Fact Sheet for Healthcare Providers: https://www.-mathews.com/  This test is not yet approved or cleared by the [place] FDA and  has been authorized for detection and/or diagnosis of SARS-CoV-2 by FDA under an Emergency Use Authorization (EUA). This EUA will remain  in effect (meaning this test can be used) for the duration of the COVID-19 declaration  under Se ction 564(b)(1) of the Act, 21 U.S.C. section 360bbb-3(b)(1), unless the authorization is terminated or revoked sooner.  Performed at [facility], [place] [address] [place], [place] [address]          Radiology Studies: No results found.      Scheduled Meds:  amLODipine  2.5 mg Oral Daily   citalopram  40 mg Oral Daily   enoxaparin (LOVENOX) injection  40 mg Subcutaneous Q24H   EPINEPHrine  0.3 mg Intramuscular Once   EPINEPHrine  0.3 mg Intramuscular Once   insulin aspart  0-20 Units Subcutaneous Q4H   insulin glargine-yfgn  15 Units Subcutaneous Daily   isosorbide mononitrate  30 mg Oral Daily   latanoprost  1 drop Both Eyes QHS   losartan  50 mg Oral Daily   pantoprazole  40 mg Oral BID AC   predniSONE  5 mg Oral Q breakfast   Racepinephrine HCl  0.5 mL Nebulization Once   Continuous Infusions:  lactated ringers 50 mL/hr at 01/06/21 1809     LOS: 5 days   The patient is critically ill with multiple organ systems failure and requires high complexity decision making for assessment and support, frequent evaluation and titration of therapies, application of advanced monitoring technologies and extensive interpretation of multiple databases. Critical Care Time devoted to patient care services described in this note  Time spent: 40 minutes     Rodderick Holtzer, Geraldo Docker, MD [facility]   If 7PM-7AM, please contact night-coverage 01/07/2021, 1:20 PM

## 2021-01-07 NOTE — Progress Notes (Addendum)
Inpatient Diabetes Program Recommendations  AACE/ADA: New Consensus Statement on Inpatient Glycemic Control (2015)  Target Ranges:  Prepandial:   less than 140 mg/dL      Peak postprandial:   less than 180 mg/dL (1-2 hours)      Critically ill patients:  140 - 180 mg/dL   Lab Results  Component Value Date   GLUCAP 350 (H) 01/07/2021   HGBA1C 6.6 (H) 01/03/2021    Review of Glycemic Control Results for [name], [name] (MRN [id number]) as of 01/07/2021 14:59  Ref. Range 01/07/2021 00:34 01/07/2021 00:40 01/07/2021 03:54 01/07/2021 08:04 01/07/2021 11:55  Glucose-Capillary Latest Ref Range: 70 - 99 mg/dL 246 (H) 271 (H) 155 (H) 103 (H) 350 (H)   Diabetes history: DM 2 Outpatient Diabetes medications: Glipizide 10 mg bid, NPH 40 units bid, PO prednisone 5 mg Daily Current orders for Inpatient glycemic control:  Semglee 10 units Daily Novolog 0-20 units q 4 hrs  Inpatient Diabetes Program Recommendations:   Consider while on steroids: -Decrease Novolog correction to 0-9 units q 4 hrs. -Novolog 3 units tid meal coverage if eats 50% meals.  Thank you, Nani Gasser. Rayen Palen, RN, MSN, CDE  Diabetes Coordinator Inpatient Glycemic Control Team Team Pager [phone] (8am-5pm) 01/07/2021 3:04 PM

## 2021-01-08 ENCOUNTER — Inpatient Hospital Stay (HOSPITAL_COMMUNITY): Payer: Medicare Other

## 2021-01-08 DIAGNOSIS — K56609 Unspecified intestinal obstruction, unspecified as to partial versus complete obstruction: Secondary | ICD-10-CM | POA: Diagnosis not present

## 2021-01-08 LAB — COMPREHENSIVE METABOLIC PANEL
ALT: 24 U/L (ref 0–44)
AST: 19 U/L (ref 15–41)
Albumin: 2.8 g/dL — ABNORMAL LOW (ref 3.5–5.0)
Alkaline Phosphatase: 65 U/L (ref 38–126)
Anion gap: 11 (ref 5–15)
BUN: 12 mg/dL (ref 8–23)
CO2: 34 mmol/L — ABNORMAL HIGH (ref 22–32)
Calcium: 9 mg/dL (ref 8.9–10.3)
Chloride: 95 mmol/L — ABNORMAL LOW (ref 98–111)
Creatinine, Ser: 0.99 mg/dL (ref 0.44–1.00)
GFR, Estimated: 57 mL/min — ABNORMAL LOW (ref >60)
Glucose, Bld: 117 mg/dL — ABNORMAL HIGH (ref 70–99)
Potassium: 3.9 mmol/L (ref 3.5–5.1)
Sodium: 140 mmol/L (ref 135–145)
Total Bilirubin: 0.5 mg/dL (ref 0.3–1.2)
Total Protein: 5.2 g/dL — ABNORMAL LOW (ref 6.5–8.1)

## 2021-01-08 LAB — CBC WITH DIFFERENTIAL/PLATELET
Abs Immature Granulocytes: 0.04 10*3/uL (ref 0.00–0.07)
Basophils Absolute: 0.1 10*3/uL (ref 0.0–0.1)
Basophils Relative: 1 %
Eosinophils Absolute: 0.3 10*3/uL (ref 0.0–0.5)
Eosinophils Relative: 3 %
HCT: 38.1 % (ref 36.0–46.0)
Hemoglobin: 11.9 g/dL — ABNORMAL LOW (ref 12.0–15.0)
Immature Granulocytes: 0 %
Lymphocytes Relative: 35 %
Lymphs Abs: 3.1 10*3/uL (ref 0.7–4.0)
MCH: 29.6 pg (ref 26.0–34.0)
MCHC: 31.2 g/dL (ref 30.0–36.0)
MCV: 94.8 fL (ref 80.0–100.0)
Monocytes Absolute: 0.6 10*3/uL (ref 0.1–1.0)
Monocytes Relative: 7 %
Neutro Abs: 4.9 10*3/uL (ref 1.7–7.7)
Neutrophils Relative %: 54 %
Platelets: 274 10*3/uL (ref 150–400)
RBC: 4.02 MIL/uL (ref 3.87–5.11)
RDW: 13.8 % (ref 11.5–15.5)
WBC: 9 10*3/uL (ref 4.0–10.5)
nRBC: 0 % (ref 0.0–0.2)

## 2021-01-08 LAB — GLUCOSE, CAPILLARY
Glucose-Capillary: 115 mg/dL — ABNORMAL HIGH (ref 70–99)
Glucose-Capillary: 120 mg/dL — ABNORMAL HIGH (ref 70–99)
Glucose-Capillary: 122 mg/dL — ABNORMAL HIGH (ref 70–99)
Glucose-Capillary: 161 mg/dL — ABNORMAL HIGH (ref 70–99)
Glucose-Capillary: 225 mg/dL — ABNORMAL HIGH (ref 70–99)
Glucose-Capillary: 242 mg/dL — ABNORMAL HIGH (ref 70–99)
Glucose-Capillary: 268 mg/dL — ABNORMAL HIGH (ref 70–99)

## 2021-01-08 LAB — MAGNESIUM: Magnesium: 1.8 mg/dL (ref 1.7–2.4)

## 2021-01-08 LAB — PHOSPHORUS: Phosphorus: 4 mg/dL (ref 2.5–4.6)

## 2021-01-08 MED ORDER — MECLIZINE HCL 25 MG PO TABS
25.0000 mg | ORAL_TABLET | Freq: Three times a day (TID) | ORAL | Status: DC
  Administered 2021-01-08 – 2021-01-10 (×6): 25 mg via ORAL
  Filled 2021-01-08 (×9): qty 1

## 2021-01-08 MED ORDER — INSULIN ASPART 100 UNIT/ML IJ SOLN
0.0000 [IU] | Freq: Three times a day (TID) | INTRAMUSCULAR | Status: DC
  Administered 2021-01-09: 5 [IU] via SUBCUTANEOUS
  Administered 2021-01-09: 8 [IU] via SUBCUTANEOUS
  Administered 2021-01-09 – 2021-01-10 (×2): 3 [IU] via SUBCUTANEOUS

## 2021-01-08 MED ORDER — INSULIN ASPART 100 UNIT/ML IJ SOLN
0.0000 [IU] | Freq: Every day | INTRAMUSCULAR | Status: DC
Start: 2021-01-08 — End: 2021-01-10
  Administered 2021-01-08: 3 [IU] via SUBCUTANEOUS

## 2021-01-08 NOTE — Progress Notes (Signed)
[facility] Progress Note  Patient: [name]    [id number]  DOA: 01/02/2021     Date of Service: the patient was seen and examined on 01/08/2021  Brief hospital course: Past medical history of HTN, HLD, type II DM, Roux-en-Y, ventral hernia repair.  Admitted with complaints of nausea and vomiting found to have small bowel obstruction treated conservatively.  Symptoms reoccurred on 8/9.  Discharge and persistent.  Currently plan is further work-up for dizziness and nausea and vomiting.  Subjective: Reports dizziness with diplopia ongoing since yesterday.  Also reports nausea but no vomiting.  Tolerating oral diet despite nausea.  Assessment and Plan: 1.  Small bowel obstruction. Intractable nausea and vomiting. Constipation. Ventral hernia. General surgery was consulted. Conservative management were performed. CT scan on 8/9 shows no evidence of SBO or ileus. Currently continue with soft diet.  2.  Dizziness Suspect peripheral vertigo.  Vestibular rehab requested. Will get CT head to rule out acute abnormality.  Meclizine scheduled added.  3.  Type 2 diabetes mellitus, uncontrolled with hyperglycemia Patient was on every 4 hours insulin sliding scale. Will transition to [facility] at bedtime coverage.  4.  HTN On amlodipine. Recommend to discontinue this medication if the patient continues to have nausea secondary to constipation  5.  Chronic steroids On prednisone.  We will continue.  6.  Hypomagnesemia Corrected.  Scheduled Meds:  amLODipine  2.5 mg Oral Daily   citalopram  40 mg Oral Daily   enoxaparin (LOVENOX) injection  40 mg Subcutaneous Q24H   insulin aspart  0-15 Units Subcutaneous TID WC   insulin aspart  0-5 Units Subcutaneous QHS   insulin glargine-yfgn  15 Units Subcutaneous Daily   isosorbide mononitrate  30 mg Oral Daily   latanoprost  1 drop Both Eyes QHS   losartan  50 mg Oral Daily   meclizine  25 mg Oral TID   pantoprazole  40 mg Oral BID AC    predniSONE  5 mg Oral Q breakfast   Continuous Infusions: PRN Meds: acetaminophen **OR** acetaminophen, hydrALAZINE, labetalol, LORazepam, ondansetron **OR** ondansetron (ZOFRAN) IV  Body mass index is 36.58 kg/m.        DVT Prophylaxis:   enoxaparin (LOVENOX) injection 40 mg Start: 01/03/21 1000    Advance goals of care discussion: Pt is Full code.  Family Communication: no family was present at bedside, at the time of interview.   Data Reviewed: I have personally reviewed and interpreted daily labs, tele strips, imaging. CBG uncontrolled.  Electrolytes stable.  Creatinine stable.  No leukocytosis.  Hemoglobin stable.  CT head unremarkable.  Physical Exam:  General: Appear in mild distress, no Rash; Oral Mucosa Clear, moist. no Abnormal Neck Mass Or lumps, Conjunctiva normal  Cardiovascular: S1 and S2 Present, no Murmur, Respiratory: good respiratory effort, Bilateral Air entry present and CTA, no Crackles, no wheezes Abdomen: Bowel Sound present, Soft and no tenderness Extremities: no Pedal edema Neurology: alert and oriented to time, place, and person affect appropriate. no new focal deficit, no asterixis, and nystagmus to the left Gait not checked due to patient safety concerns    Vitals:   01/07/21 2034 01/08/21 0605 01/08/21 0848 01/08/21 1310  BP: (!) 132/57 (!) 157/75 (!) 178/69 (!) 144/58  Pulse: (!) 51 60 83 (!) 56  Resp: '15 17 17 17  '$ Temp: 98.4 F (36.9 C) 98.2 F (36.8 C) 97.7 F (36.5 C) 98.1 F (36.7 C)  TempSrc: Oral Oral Oral Oral  SpO2: 93% 96% 95% 95%  Weight:      Height:        Disposition:  Status is: Inpatient  Remains inpatient appropriate because:IV treatments appropriate due to intensity of illness or inability to take PO  Dispo: The patient is from: Home              Anticipated d/c is to: Home              Patient currently is not medically stable to d/c.   Difficult to place patient No   Time spent: 35 minutes. I reviewed all  nursing notes, pharmacy notes, vitals, pertinent old records. I have discussed plan of care as described above with RN.  Author: Berle Mull, MD [facility] 01/08/2021 6:58 PM  To reach On-call, see care teams to locate the attending and reach out via [email or web address]. Between 7PM-7AM, please contact night-coverage If you still have difficulty reaching the attending provider, please page the [facility] (Director on Call) for [facility] on amion for assistance.

## 2021-01-08 NOTE — Plan of Care (Signed)

## 2021-01-08 NOTE — Progress Notes (Signed)
00:00 CBG 61 patient was given 2 packs of graham crackers and regular soda Will recheck when patient has finished eating.

## 2021-01-08 NOTE — Progress Notes (Signed)
Inpatient Diabetes Program Recommendations  AACE/ADA: New Consensus Statement on Inpatient Glycemic Control (2015)  Target Ranges:  Prepandial:   less than 140 mg/dL      Peak postprandial:   less than 180 mg/dL (1-2 hours)      Critically ill patients:  140 - 180 mg/dL   Lab Results  Component Value Date   GLUCAP 242 (H) 01/08/2021   HGBA1C 6.6 (H) 01/03/2021    Review of Glycemic Control Results for [name], [name] (MRN [id number]) as of 01/08/2021 11:01  Ref. Range 01/07/2021 11:55 01/07/2021 17:14 01/07/2021 20:28 01/07/2021 23:53 01/08/2021 00:29 01/08/2021 00:41 01/08/2021 04:12 01/08/2021 08:43  Glucose-Capillary Latest Ref Range: 70 - 99 mg/dL 350 (H) 300 (H) 131 (H) 61 (L) 120 (H) 161 (H) 122 (H) 242 (H)   Diabetes history: DM 2 Outpatient Diabetes medications: Glipizide 10 mg bid, NPH 40 units bid, PO prednisone 5 mg Daily Current orders for Inpatient glycemic control:  Semglee 10 units Daily Novolog 5 units tid meal coverage Novolog 0-20 units q 4 hrs  Inpatient Diabetes Program Recommendations:   Patient had hypoglycemia last hs post correction of 3 units for CBG 131. -Decrease Novolog correction to 0-9 units tid + hs 0-5 units Secure chat sent to Dr. Posey Pronto.  Thank you, Nani Gasser. Tylin Stradley, RN, MSN, CDE  Diabetes Coordinator Inpatient Glycemic Control Team Team Pager [phone] (8am-5pm) 01/08/2021 11:06 AM

## 2021-01-08 NOTE — Progress Notes (Signed)
Follow up CBG 161. Arthor Captain LPN

## 2021-01-09 ENCOUNTER — Inpatient Hospital Stay (HOSPITAL_COMMUNITY): Payer: Medicare Other

## 2021-01-09 DIAGNOSIS — K56609 Unspecified intestinal obstruction, unspecified as to partial versus complete obstruction: Secondary | ICD-10-CM | POA: Diagnosis not present

## 2021-01-09 LAB — CBC WITH DIFFERENTIAL/PLATELET
Abs Immature Granulocytes: 0.03 10*3/uL (ref 0.00–0.07)
Basophils Absolute: 0.1 10*3/uL (ref 0.0–0.1)
Basophils Relative: 1 %
Eosinophils Absolute: 0.3 10*3/uL (ref 0.0–0.5)
Eosinophils Relative: 3 %
HCT: 36.3 % (ref 36.0–46.0)
Hemoglobin: 11.4 g/dL — ABNORMAL LOW (ref 12.0–15.0)
Immature Granulocytes: 0 %
Lymphocytes Relative: 34 %
Lymphs Abs: 2.9 10*3/uL (ref 0.7–4.0)
MCH: 29.8 pg (ref 26.0–34.0)
MCHC: 31.4 g/dL (ref 30.0–36.0)
MCV: 95 fL (ref 80.0–100.0)
Monocytes Absolute: 0.5 10*3/uL (ref 0.1–1.0)
Monocytes Relative: 6 %
Neutro Abs: 4.7 10*3/uL (ref 1.7–7.7)
Neutrophils Relative %: 56 %
Platelets: 269 10*3/uL (ref 150–400)
RBC: 3.82 MIL/uL — ABNORMAL LOW (ref 3.87–5.11)
RDW: 13.9 % (ref 11.5–15.5)
WBC: 8.5 10*3/uL (ref 4.0–10.5)
nRBC: 0 % (ref 0.0–0.2)

## 2021-01-09 LAB — COMPREHENSIVE METABOLIC PANEL
ALT: 22 U/L (ref 0–44)
AST: 15 U/L (ref 15–41)
Albumin: 2.6 g/dL — ABNORMAL LOW (ref 3.5–5.0)
Alkaline Phosphatase: 63 U/L (ref 38–126)
Anion gap: 5 (ref 5–15)
BUN: 11 mg/dL (ref 8–23)
CO2: 35 mmol/L — ABNORMAL HIGH (ref 22–32)
Calcium: 9.1 mg/dL (ref 8.9–10.3)
Chloride: 99 mmol/L (ref 98–111)
Creatinine, Ser: 0.92 mg/dL (ref 0.44–1.00)
GFR, Estimated: >60 mL/min (ref >60)
Glucose, Bld: 158 mg/dL — ABNORMAL HIGH (ref 70–99)
Potassium: 4.4 mmol/L (ref 3.5–5.1)
Sodium: 139 mmol/L (ref 135–145)
Total Bilirubin: 0.4 mg/dL (ref 0.3–1.2)
Total Protein: 5 g/dL — ABNORMAL LOW (ref 6.5–8.1)

## 2021-01-09 LAB — GLUCOSE, CAPILLARY
Glucose-Capillary: 273 mg/dL — ABNORMAL HIGH (ref 70–99)
Glucose-Capillary: 197 mg/dL — ABNORMAL HIGH (ref 70–99)
Glucose-Capillary: 245 mg/dL — ABNORMAL HIGH (ref 70–99)
Glucose-Capillary: 158 mg/dL — ABNORMAL HIGH (ref 70–99)

## 2021-01-09 MED ORDER — MECLIZINE HCL 25 MG PO TABS: 25.0000 mg | ORAL_TABLET | Freq: Three times a day (TID) | ORAL | 0 refills | Status: AC

## 2021-01-09 MED ORDER — METOCLOPRAMIDE HCL 5 MG/ML IJ SOLN
5.0000 mg | Freq: Four times a day (QID) | INTRAMUSCULAR | Status: DC
  Administered 2021-01-09 – 2021-01-10 (×5): 5 mg via INTRAVENOUS
  Filled 2021-01-09 (×5): qty 2

## 2021-01-09 NOTE — Progress Notes (Signed)
Occupational Therapy Treatment Note  Pt seen for additional session to further address functional vision. Pt appears to be R eye dominant and complaining of vertical diplopia skewed diagonally. Nasal field of L eye partially occluded with 27M Transpore tape to diminish double image, Pt reports improvement in ability to see "only 1". Began education regarding management of partial occlusion by reducing width of tape every 3 days, working toward nose. Pt demonstrated improved balance with use of partial occlusion. Educated pt on importance of following up with her eye doctor and to refrain from driving until cleared by her eye doctor.  Pt verbalized understanding and was very appreciative.  Will follow acutely.     01/09/21 1700  OT Visit Information  Last OT Received On 01/09/21  Assistance Needed +1  History of Present Illness 80 y.o. female presented with c/o 7 day h/o "constipation"; likely adhesive sbo; 8/10 developed dizziness and diplopia; Head CT negative for acute changes  PMH significant of HTN, HLD, DM2, gastric bypass, 2 ventral hernia repairs with recurrence after each.  Precautions  Precautions Fall  Pain Assessment  Pain Assessment No/denies pain  Cognition  Arousal/Alertness Awake/alert  Behavior During Therapy WFL for tasks assessed/performed  Overall Cognitive Status Within Functional Limits for tasks assessed  ADL  General ADL Comments Began education regarding strategies to reduce risk of falls. Pt able to navigate around  room @ RW level with close S and VC for hand placement and safe use of RW. Educated how depth perception can be affected with visual cahnges and the need to use tactile sense to reduce risk of falls. discussed options for IADL tasks and using delivery for groceries. Pt verbalized understanding.  Bed Mobility  Overal bed mobility Modified Independent  Balance  Overall balance assessment Mild deficits observed, not formally tested  Vision- Assessment   Vision Assessment? Yes  Eye Alignment Impaired (comment)  Ocular Range of Motion Restricted on the right  Alignment/Gaze Preference Head tilt  Tracking/Visual Pursuits Decreased smoothness of vertical tracking  Saccades Additional eye shifts occurred during testing  Convergence WFL  Visual Fields No apparent deficits  Diplopia Assessment Objects split on top of one another;Present in far gaze;Only with right gaze ("diagonal")  Depth Perception Overshoots  Additional Comments Pt appears to be R eye dominant. Additional eye movement with cover/uncover test on R. Partial occlsuion used toocclued nasal portion of L lens. Pt with apparent relief of double image and reports improvement in functional vision. Begn educating pt on management of taping at home.  OT - End of Session  Equipment Utilized During Treatment Rolling walker  Activity Tolerance Patient tolerated treatment well  Patient left in bed;with call bell/phone within reach;with bed alarm set  Nurse Communication Mobility status;Other (comment)  OT Assessment/Plan  OT Plan Discharge plan remains appropriate  OT Visit Diagnosis Unsteadiness on feet (R26.81);Low vision, both eyes (H54.2);Dizziness and giddiness (R42)  OT Frequency (ACUTE ONLY) Min 2X/week  Follow Up Recommendations Other (comment) (follow up with eye doctor. Refrain from driving until cleared by eye doctor.)  OT Equipment None recommended by OT  AM-PAC OT "6 Clicks" Daily Activity Outcome Measure (Version 2)  Help from another person eating meals? 4  Help from another person taking care of personal grooming? 4  Help from another person toileting, which includes using toliet, bedpan, or urinal? 3  Help from another person bathing (including washing, rinsing, drying)? 3  Help from another person to put on and taking off regular upper body clothing? 4  Help from another person to put on and taking off regular lower body clothing? 3  6 Click Score 21  Progressive  Mobility  What is the highest level of mobility based on the progressive mobility assessment? Level 5 (Walks with assist in room/hall) - Balance while stepping forward/back and can walk in room with assist - Complete  Mobility Out of bed to chair with meals;Out of bed for toileting;Ambulated with assistance in room  OT Goal Progression  Progress towards OT goals Progressing toward goals  Acute Rehab OT Goals  Patient Stated Goal find out what's wrong with my eye  OT Goal Formulation With patient  Time For Goal Achievement 01/23/21  Potential to Achieve Goals Good  OT Time Calculation  OT Start Time (ACUTE ONLY) 1330  OT Stop Time (ACUTE ONLY) 1357  OT Time Calculation (min) 27 min  OT General Charges  $OT Visit 1 Visit  OT Treatments  $Therapeutic Activity 23-37 mins   Maurie Boettcher, OT/L   Acute OT Clinical Specialist [place] Pager [phone] Office [phone]

## 2021-01-09 NOTE — TOC Initial Note (Signed)
Transition of Care [facility]) - Initial/Assessment Note    Patient Details  Name: [name] MRN: [id number] Date of Birth: 07-Aug-1940  Transition of Care [facility]) CM/SW Contact:    Marilu Favre, RN Phone Number: 01/09/2021, 2:33 PM  Clinical Narrative:                 Spoke to patient at bedside regarding home health PT. Patient does not feel that she needs home health PT at this time , if she changes her mind after discharge she will call PCP to arrange.  Expected Discharge Plan: Home/Self Care Barriers to Discharge: Continued Medical Work up   Patient Goals and CMS Choice Patient states their goals for this hospitalization and ongoing recovery are:: to go home CMS Medicare.gov Compare Post Acute Care list provided to:: Patient Choice offered to / list presented to : Patient  Expected Discharge Plan and Services Expected Discharge Plan: Home/Self Care     Post Acute Care Choice: [place] arrangements for the past 2 months: Single Family Home                 DME Arranged: N/A DME Agency: NA       HH Arranged: Patient Refused HH          Prior Living Arrangements/Services Living arrangements for the past 2 months: Single Family Home Lives with:: Adult Children Patient language and need for interpreter reviewed:: Yes        Need for Family Participation in Patient Care: Yes (Comment) Care giver support system in place?: Yes (comment) Current home services: DME Criminal Activity/Legal Involvement Pertinent to Current Situation/Hospitalization: No - Comment as needed  Activities of Daily Living Home Assistive Devices/Equipment: Eyeglasses, Hearing aid, Dentures (specify type), Walker (specify type) ADL Screening (condition at time of admission) Patient's cognitive ability adequate to safely complete daily activities?: Yes Is the patient deaf or have difficulty hearing?: Yes Does the patient have difficulty seeing, even when wearing glasses/contacts?:  Yes Does the patient have difficulty concentrating, remembering, or making decisions?: Yes Patient able to express need for assistance with ADLs?: No Does the patient have difficulty dressing or bathing?: No Independently performs ADLs?: Yes (appropriate for developmental age) Does the patient have difficulty walking or climbing stairs?: Yes Weakness of Legs: Both Weakness of Arms/Hands: None  Permission Sought/Granted   Permission granted to share information with : No              Emotional Assessment Appearance:: Appears stated age Attitude/Demeanor/Rapport: Engaged Affect (typically observed): Accepting Orientation: : Oriented to Self, Oriented to Place, Oriented to  Time, Oriented to Situation Alcohol / Substance Use: Not Applicable Psych Involvement: No (comment)  Admission diagnosis:  Small bowel obstruction ([place]) [K56.609] SBO (small bowel obstruction) ([place]) [K56.609] Patient Active Problem List   Diagnosis Date Noted   SBO (small bowel obstruction) ([place]) 01/02/2021   Current chronic use of systemic steroids 01/02/2021   Venous insufficiency 06/10/2020   High risk social situation 06/10/2020   Moderate nonproliferative diabetic retinopathy of left eye ([place]) 06/06/2020   Macular pucker, left eye 12/05/2019   Non-proliferative diabetic retinopathy, severe, right eye ([place]) 12/05/2019   Acute respiratory infection 06/23/2016   Marital dysfunction 01/22/2016   Coronary artery disease involving native coronary artery of native heart with angina pectoris ([place]) 08/22/2014   Takotsubo syndrome 08/22/2014   Obesity (BMI 30-39.9)    Recurrent ventral incisional hernia 08/20/2014   Elevated troponin    Chronic pain syndrome 08/22/2013  Chest pain at rest 04/17/2013   Essential hypertension, benign 04/17/2013   Controlled diabetes mellitus with nephropathy ([place]) 01/27/2013   HLD (hyperlipidemia) 01/27/2013   Lumbago    DOE (dyspnea on exertion)    Hypothyroidism     Vitamin B12 deficiency 12/18/2009   Depression 12/18/2009   Vitamin D deficiency disease 02/13/2009   Esophageal spasm 02/13/2009   MCI (mild cognitive impairment) 10/24/2008   Pulmonary nodule 06/18/2008   Esophageal reflux 04/12/2007   Carotid bruit 04/21/2006   Extrinsic asthma 08/25/2005   Gout 11/12/2004   Unsteady gait 10/20/2004   PCP:  Wardell Honour, MD Pharmacy:   [id number] [id number]: [phone]: [phone]    Social Determinants of Health (SDOH) Interventions    Readmission Risk Interventions No flowsheet data found.

## 2021-01-09 NOTE — Evaluation (Signed)
Physical Therapy Evaluation Patient Details Name: [name] MRN: [id number] DOB: 1940/08/19 Today's Date: 01/09/2021   History of Present Illness  80 y.o. female presented with c/o 7 day h/o "constipation"; likely adhesive sbo; 8/10 developed dizziness and diplopia; Head CT negative for acute changes  PMH significant of HTN, HLD, DM2, gastric bypass, 2 ventral hernia repairs with recurrence after each  Clinical Impression   Pt admitted secondary to problem above with deficits below. PTA patient was independent, lives alone and drives. Pt currently requires minguard assist with use of RW due to unsteadiness due to diplopia and vertigo. Vestibular assessment negative for a peripheral vestibulopathy, however pt does have dysconjugate gaze with right eye elevated compare to left. She describes double-vision in right and center fields of vision and denies double-vision in her left field of vision.  Coordination testing WNL. Dr. Posey Pronto made aware and agreed with OT Consult to further assess vision and diplopia. Also recommend Neuro Consult.  Anticipate patient will benefit from PT to address problems listed below.Will continue to follow acutely to maximize functional mobility independence and safety.       Follow Up Recommendations Home health PT    Equipment Recommendations  None recommended by PT    Recommendations for Other Services OT consult;Other (comment) (neuro Consult)     Precautions / Restrictions Precautions Precautions: Fall   Vestibular Assessment    01/09/21 0001  Symptom Behavior  Subjective history of current problem dizziness (spinning) and diplopia began yesterday  Type of Dizziness  Diplopia;Spinning  Frequency of Dizziness daily  Duration of Dizziness all day  Symptom Nature Constant  Aggravating Factors Not applicable  Relieving Factors Comments (turning eyes/looking left)  Progression of Symptoms No change since onset  History of similar episodes none   Oculomotor Exam  Oculomotor Alignment Abnormal (right eye elevated compared to left)  Ocular ROM WNL  Spontaneous Absent  Gaze-induced  Absent  Smooth Pursuits Intact  Saccades Intact  Vestibulo-Ocular Reflex  VOR 1 Head Only (x 1 viewing) intact  VOR Cancellation Normal  Auditory  Comments denies changes     Mobility  Bed Mobility Overal bed mobility: Independent                  Transfers Overall transfer level: Needs assistance Equipment used: Rolling walker (2 wheeled) Transfers: Sit to/from Stand Sit to Stand: Min guard         General transfer comment: minguard for safety without LOB  Ambulation/Gait Ambulation/Gait assistance: Min guard Gait Distance (Feet): 50 Feet Assistive device: Rolling walker (2 wheeled) Gait Pattern/deviations: Step-through pattern;Decreased stride length;Wide base of support     General Gait Details: no overt loss of balance, but pt clearly heavily relying on RW for support and feels unsteady  Stairs            Wheelchair Mobility    Modified Rankin (Stroke Patients Only)       Balance Overall balance assessment: Needs assistance Sitting-balance support: No upper extremity supported;Feet unsupported Sitting balance-Leahy Scale: Good     Standing balance support: Bilateral upper extremity supported Standing balance-Leahy Scale: Poor                               Pertinent Vitals/Pain Pain Assessment: No/denies pain    Home Living Family/patient expects to be discharged to:: Private residence Living Arrangements: Alone   Type of Home: House Home Access: Stairs to enter Entrance Stairs-Rails: Right  Entrance Stairs-Number of Steps: 7 Home Layout: Two level;Able to live on main level with bedroom/bathroom Home Equipment: Gilford Rile - 2 wheels;Cane - single point      Prior Function Level of Independence: Independent         Comments: drives, maintains her house; mostly reheats food in  microwave doesn't really "cook"; furniture walks in her home, in community uses RW     Hand Dominance   Dominant Hand: Right    Extremity/Trunk Assessment   Upper Extremity Assessment Upper Extremity Assessment: Overall WFL for tasks assessed (intact RAM)    Lower Extremity Assessment Lower Extremity Assessment: Overall WFL for tasks assessed (intact coordnation and RAM)    Cervical / Trunk Assessment Cervical / Trunk Assessment: Other exceptions Cervical / Trunk Exceptions: overweight  Communication   Communication: No difficulties  Cognition Arousal/Alertness: Awake/alert Behavior During Therapy: WFL for tasks assessed/performed Overall Cognitive Status: Within Functional Limits for tasks assessed                                        General Comments      Exercises     Assessment/Plan    PT Assessment Patient needs continued PT services  PT Problem List Decreased activity tolerance;Decreased balance;Decreased mobility;Decreased knowledge of use of DME       PT Treatment Interventions DME instruction;Gait training;Stair training;Functional mobility training;Therapeutic activities;Balance training;Patient/family education    PT Goals (Current goals can be found in the Care Plan section)  Acute Rehab PT Goals Patient Stated Goal: find out what's wrong with my eye PT Goal Formulation: With patient Time For Goal Achievement: 01/23/21 Potential to Achieve Goals: Good    Frequency Min 3X/week   Barriers to discharge Decreased caregiver support      Co-evaluation               AM-PAC PT "6 Clicks" Mobility  Outcome Measure Help needed turning from your back to your side while in a flat bed without using bedrails?: None Help needed moving from lying on your back to sitting on the side of a flat bed without using bedrails?: None Help needed moving to and from a bed to a chair (including a wheelchair)?: A Little Help needed standing up  from a chair using your arms (e.g., wheelchair or bedside chair)?: A Little Help needed to walk in hospital room?: A Little Help needed climbing 3-5 steps with a railing? : A Little 6 Click Score: 20    End of Session   Activity Tolerance: Patient tolerated treatment well Patient left: in bed;with call bell/phone within reach Nurse Communication: Mobility status PT Visit Diagnosis: Unsteadiness on feet (R26.81)    Time: [id number] PT Time Calculation (min) (ACUTE ONLY): 26 min   Charges:   PT Evaluation $PT Eval Moderate Complexity: 1 Mod PT Treatments $Gait Training: 8-22 mins         Arby Barrette, PT Pager [phone]   Rexanne Mano 01/09/2021, 12:04 PM

## 2021-01-09 NOTE — Progress Notes (Signed)
Inpatient Diabetes Program Recommendations  AACE/ADA: New Consensus Statement on Inpatient Glycemic Control (2015)  Target Ranges:  Prepandial:   less than 140 mg/dL      Peak postprandial:   less than 180 mg/dL (1-2 hours)      Critically ill patients:  140 - 180 mg/dL   Lab Results  Component Value Date   GLUCAP 273 (H) 01/09/2021   HGBA1C 6.6 (H) 01/03/2021    Review of Glycemic Control Results for ARIANE, HOWINGTON (MRN [id number]) as of 01/09/2021 10:54  Ref. Range 01/08/2021 08:43 01/08/2021 13:11 01/08/2021 15:58 01/08/2021 20:55 01/09/2021 08:28  Glucose-Capillary Latest Ref Range: 70 - 99 mg/dL 242 (H) 225 (H) 115 (H) 268 (H) 273 (H)   Diabetes history: DM 2 Outpatient Diabetes medications: Glipizide 10 mg bid, NPH 40 units bid, PO prednisone 5 mg Daily Current orders for Inpatient glycemic control:  Semglee 10 units Daily Novolog 5 units tid meal coverage Novolog 0-15 units tid + hs  Inpatient Diabetes Program Recommendations:   Fasting glucose elevated - consider increasing Semglee to 20 units  Thanks, Tama Headings RN, MSN, BC-ADM Inpatient Diabetes Coordinator Team Pager [phone] (8a-5p)

## 2021-01-09 NOTE — Progress Notes (Signed)
[facility] Progress Note  Patient: [name]    [id number]  DOA: 01/02/2021     Date of Service: the patient was seen and examined on 01/09/2021  Brief hospital course: Past medical history of HTN, HLD, type II DM, Roux-en-Y, ventral hernia repair.  Admitted with complaints of nausea and vomiting found to have small bowel obstruction treated conservatively.  Symptoms reoccurred on 8/9.  Discharge and persistent.  Currently plan is further work-up for dizziness and nausea.   Subjective: Continues to report double vision.  Also has dizziness.  Also has nausea without any vomiting. Tolerating oral diet.-Bowel movements.  Assessment and Plan: 1.  Small bowel obstruction. Intractable nausea and vomiting. Constipation. Ventral hernia. General surgery was consulted. Conservative management were performed. CT scan on 8/9 shows no evidence of SBO or ileus. Currently continue with soft diet. Due to persistent nausea and IV Reglan and monitor.  2.  Dizziness Vertical diplopia CT head negative for any acute stroke. Clinically also less likely stroke. Started on meclizine. Has been on meclizine intermittently for the last 3 years. Vestibular rehab evaluation performed, less likely vestibular issue causing dizziness. OT evaluated the patient, appears to have some structural issues with eye  for which patient will benefit from outpatient follow-up with ophthalmology.  Eye patch for now. Discussed with neurology, recommend MRI brain without contrast.  Will monitor results.  3.  Type 2 diabetes mellitus, uncontrolled with hyperglycemia Patient was on every 4 hours insulin sliding scale. Will transition to [facility] at bedtime coverage.  4.  HTN On amlodipine. Recommend to discontinue this medication if the patient continues to have nausea secondary to constipation  5.  Chronic steroids On prednisone.  We will continue.  6.  Hypomagnesemia Corrected.  Scheduled Meds:  amLODipine   2.5 mg Oral Daily   citalopram  40 mg Oral Daily   enoxaparin (LOVENOX) injection  40 mg Subcutaneous Q24H   insulin aspart  0-15 Units Subcutaneous TID WC   insulin aspart  0-5 Units Subcutaneous QHS   insulin glargine-yfgn  15 Units Subcutaneous Daily   isosorbide mononitrate  30 mg Oral Daily   latanoprost  1 drop Both Eyes QHS   losartan  50 mg Oral Daily   meclizine  25 mg Oral TID   metoCLOPramide (REGLAN) injection  5 mg Intravenous Q6H   pantoprazole  40 mg Oral BID AC   predniSONE  5 mg Oral Q breakfast   Continuous Infusions: PRN Meds: acetaminophen **OR** acetaminophen, hydrALAZINE, labetalol, LORazepam, ondansetron **OR** ondansetron (ZOFRAN) IV  Body mass index is 36.58 kg/m.        DVT Prophylaxis:   enoxaparin (LOVENOX) injection 40 mg Start: 01/03/21 1000    Advance goals of care discussion: Pt is Full code.  Family Communication: no family was present at bedside, at the time of interview.   Data Reviewed: I have personally reviewed and interpreted daily labs, tele strips, imaging. CBG remains uncontrolled.  Electrolytes stable.  CBC stable.  Physical Exam:  General: Appear in mild distress, no Rash; Oral Mucosa Clear, moist. no Abnormal Neck Mass Or lumps, Conjunctiva normal  Cardiovascular: S1 and S2 Present, no Murmur, Respiratory: good respiratory effort, Bilateral Air entry present and CTA, no Crackles, no wheezes Abdomen: Bowel Sound present, Soft and no tenderness Extremities: no Pedal edema Neurology: alert and oriented to time, place, and person affect appropriate. no new focal deficit Gait not checked due to patient safety concerns  Vitals:   01/08/21 1925 01/09/21 0011 01/09/21  [id number] 01/09/21 1208  BP: (!) 156/59 (!) 149/58 (!) 141/51 (!) 174/77  Pulse: 77 65 (!) 55 68  Resp: '16 17 17 17  '$ Temp: 98.5 F (36.9 C) 98.3 F (36.8 C) 98.5 F (36.9 C) 97.9 F (36.6 C)  TempSrc: Oral Oral Oral Oral  SpO2: 93% 96% 95% 93%  Weight:       Height:        Disposition:  Status is: Inpatient  Remains inpatient appropriate because:IV treatments appropriate due to intensity of illness or inability to take PO  Dispo: The patient is from: Home              Anticipated d/c is to: Home              Patient currently is not medically stable to d/c.   Difficult to place patient No   Time spent: 35 minutes. I reviewed all nursing notes, pharmacy notes, vitals, pertinent old records. I have discussed plan of care as described above with RN.  Author: Berle Mull, MD [facility] 01/09/2021 5:41 PM  To reach On-call, see care teams to locate the attending and reach out via [email or web address]. Between 7PM-7AM, please contact night-coverage If you still have difficulty reaching the attending provider, please page the [facility] (Director on Call) for [facility] on amion for assistance.

## 2021-01-09 NOTE — Evaluation (Signed)
Occupational Therapy Evaluation Patient Details Name: [name] MRN: [id number] DOB: 1940/08/08 Today's Date: 01/09/2021    History of Present Illness 80 y.o. female presented with c/o 7 day h/o "constipation"; likely adhesive sbo; 8/10 developed dizziness and diplopia; Head CT negative for acute changes  PMH significant of HTN, HLD, DM2, gastric bypass, 2 ventral hernia repairs with recurrence after each.   Clinical Impression   Patient admitted for the above diagnosis.  Patient stating new onset of dizziness, nausea and double vision starting this morning, 8/112022.  PTA she lives alone with very little support.  She does state she has two son's in the area, but they work, and are not available to assist her.  Barriers are listed below.  Currently she is needing up to [facility] for in room mobility/toileting, and ADL in standing.  OT to continue to follow her in the acute setting to maximize her functional status, and for continued assessment of double vision.  OT recommends follow up with her current eye doctor to further assess double vision, and for any needed referrals to a neuro ophthalmologist.      Follow Up Recommendations  Other (comment) (follow up eye appointment for double vision)    Equipment Recommendations       Recommendations for Other Services       Precautions / Restrictions Precautions Precautions: Fall Restrictions Weight Bearing Restrictions: No      Mobility Bed Mobility Overal bed mobility: Modified Independent             General bed mobility comments: use of SR's Patient Response: Cooperative  Transfers Overall transfer level: Needs assistance Equipment used: Rolling walker (2 wheeled) Transfers: Sit to/from Stand Sit to Stand: Supervision;Min guard         General transfer comment: Able to walk in her room with RW, gaitbelt and Min Guard - occasional supervision.    Balance Overall balance assessment: Needs  assistance Sitting-balance support: No upper extremity supported;Feet unsupported Sitting balance-Leahy Scale: Good     Standing balance support: Bilateral upper extremity supported Standing balance-Leahy Scale: Poor Standing balance comment: relies on RW for support                           ADL either performed or assessed with clinical judgement   ADL                                         General ADL Comments: Generalized supervision seated and min guard with dynamic stand.  Patient states she is at baseline except for double vision.     Vision Baseline Vision/History: Wears glasses Wears Glasses: At all times Patient Visual Report: Diplopia       Perception     Praxis      Pertinent Vitals/Pain Pain Assessment: No/denies pain     Hand Dominance Right   Extremity/Trunk Assessment Upper Extremity Assessment Upper Extremity Assessment: Overall WFL for tasks assessed   Lower Extremity Assessment Lower Extremity Assessment: Defer to PT evaluation   Cervical / Trunk Assessment Cervical / Trunk Assessment: Other exceptions Cervical / Trunk Exceptions: hernia   Communication Communication Communication: HOH   Cognition Arousal/Alertness: Awake/alert Behavior During Therapy: WFL for tasks assessed/performed Overall Cognitive Status: Within Functional Limits for tasks assessed  Home Living Family/patient expects to be discharged to:: Private residence Living Arrangements: Alone   Type of Home: House Home Access: Stairs to enter [employer] of Steps: 7 Entrance Stairs-Rails: Right Home Layout: Two level;Able to live on main level with bedroom/bathroom     Bathroom Shower/Tub: Walk-in shower   Bathroom Toilet: Handicapped height     Home Equipment: Environmental consultant - 2 wheels;Cane - single point          Prior Functioning/Environment Level of  Independence: Independent        Comments: Patient continues to drive.  Able to perform ADL with increased time and effort.  Uses microwave for most meals.  Performs light home management, cares for her meds, and pays her bills.        OT Problem List: Impaired balance (sitting and/or standing);Obesity;Impaired vision/perception      OT Treatment/Interventions: Self-care/ADL training;DME and/or AE instruction;Balance training;Patient/family education;Visual/perceptual remediation/compensation    OT Goals(Current goals can be found in the care plan section) Acute Rehab OT Goals Patient Stated Goal: find out what's wrong with my eye OT Goal Formulation: With patient Time For Goal Achievement: 01/23/21 Potential to Achieve Goals: Good  OT Frequency: Min 2X/week   Barriers to D/C:    none noted       Co-evaluation              AM-PAC OT "6 Clicks" Daily Activity     Outcome Measure Help from another person eating meals?: None Help from another person taking care of personal grooming?: None Help from another person toileting, which includes using toliet, bedpan, or urinal?: A Little Help from another person bathing (including washing, rinsing, drying)?: A Little Help from another person to put on and taking off regular upper body clothing?: None Help from another person to put on and taking off regular lower body clothing?: A Little 6 Click Score: 21   End of Session Equipment Utilized During Treatment: Rolling walker;Gait belt  Activity Tolerance: Patient tolerated treatment well Patient left: in bed;with call bell/phone within reach;with bed alarm set  OT Visit Diagnosis: Unsteadiness on feet (R26.81);Dizziness and giddiness (R42)                Time: [id number] OT Time Calculation (min): 19 min Charges:  OT General Charges $OT Visit: 1 Visit OT Evaluation $OT Eval Moderate Complexity: 1 Mod  01/09/2021  Rich, OTR/L  Acute Rehabilitation Services  Office:   [place] 01/09/2021, 2:12 PM

## 2021-01-10 DIAGNOSIS — K56609 Unspecified intestinal obstruction, unspecified as to partial versus complete obstruction: Secondary | ICD-10-CM | POA: Diagnosis not present

## 2021-01-10 LAB — CBC WITH DIFFERENTIAL/PLATELET
Abs Immature Granulocytes: 0.02 K/uL (ref 0.00–0.07)
Basophils Absolute: 0 K/uL (ref 0.0–0.1)
Basophils Relative: 1 %
Eosinophils Absolute: 0.2 K/uL (ref 0.0–0.5)
Eosinophils Relative: 2 %
HCT: 37.1 % (ref 36.0–46.0)
Hemoglobin: 11.8 g/dL — ABNORMAL LOW (ref 12.0–15.0)
Immature Granulocytes: 0 %
Lymphocytes Relative: 31 %
Lymphs Abs: 2.4 K/uL (ref 0.7–4.0)
MCH: 29.8 pg (ref 26.0–34.0)
MCHC: 31.8 g/dL (ref 30.0–36.0)
MCV: 93.7 fL (ref 80.0–100.0)
Monocytes Absolute: 0.6 K/uL (ref 0.1–1.0)
Monocytes Relative: 8 %
Neutro Abs: 4.5 K/uL (ref 1.7–7.7)
Neutrophils Relative %: 58 %
Platelets: 267 K/uL (ref 150–400)
RBC: 3.96 MIL/uL (ref 3.87–5.11)
RDW: 13.8 % (ref 11.5–15.5)
WBC: 7.8 K/uL (ref 4.0–10.5)
nRBC: 0 % (ref 0.0–0.2)

## 2021-01-10 LAB — COMPREHENSIVE METABOLIC PANEL
ALT: 24 U/L (ref 0–44)
AST: 19 U/L (ref 15–41)
Albumin: 2.6 g/dL — ABNORMAL LOW (ref 3.5–5.0)
Alkaline Phosphatase: 72 U/L (ref 38–126)
Anion gap: 6 (ref 5–15)
BUN: 17 mg/dL (ref 8–23)
CO2: 33 mmol/L — ABNORMAL HIGH (ref 22–32)
Calcium: 9 mg/dL (ref 8.9–10.3)
Chloride: 98 mmol/L (ref 98–111)
Creatinine, Ser: 1.09 mg/dL — ABNORMAL HIGH (ref 0.44–1.00)
GFR, Estimated: 51 mL/min — ABNORMAL LOW (ref >60)
Glucose, Bld: 194 mg/dL — ABNORMAL HIGH (ref 70–99)
Potassium: 4 mmol/L (ref 3.5–5.1)
Sodium: 137 mmol/L (ref 135–145)
Total Bilirubin: 0.5 mg/dL (ref 0.3–1.2)
Total Protein: 5.1 g/dL — ABNORMAL LOW (ref 6.5–8.1)

## 2021-01-10 LAB — GLUCOSE, CAPILLARY
Glucose-Capillary: 182 mg/dL — ABNORMAL HIGH (ref 70–99)
Glucose-Capillary: 240 mg/dL — ABNORMAL HIGH (ref 70–99)

## 2021-01-10 MED ORDER — METOCLOPRAMIDE HCL 5 MG PO TABS: 5.0000 mg | ORAL_TABLET | Freq: Three times a day (TID) | ORAL | 0 refills | Status: DC

## 2021-01-10 NOTE — Progress Notes (Signed)
Occupational Therapy Treatment Patient Details Name: [name] MRN: [id number] DOB: 1940/11/26 Today's Date: 01/10/2021    History of present illness 80 y.o. female presented with c/o 7 day h/o "constipation"; likely adhesive sbo; 8/10 developed dizziness and diplopia; Head CT negative for acute changes. MRI (-) for acute CVA.  PMH significant of HTN, HLD, DM2, gastric bypass, 2 ventral hernia repairs with recurrence after each.   OT comments  Pt continues to complain of vertical diplopia with diagonal skew. Partial occlusion of the nasal portion of L lens significantly improves functional vision by extinguishing double image. Partial occlusion technique and progression of occlusion again reviewed with pt using handout. Pt verbalized understanding. Note improved balance today without complaints of dizziness. Educated pt on strategies to reduce risk of falls. Pt has fall alert system in place. Pt plans to move to a "retirement center" very soon, however currently lives alone with limited help. At this time recommend follow up with HHOT/PT for home safety, however Pt declining recommendation for [facility] services. Reviewed need for pt to follow up with her eye doctor before returning to driving. Discussed DC recommendations with nsg. Pt very appreciative.   Follow Up Recommendations  Home health OT;Other (comment) (Follow up with her eye doctor; refrain from driving until cleared by her eye doctor)    Equipment Recommendations  None recommended by OT    Recommendations for Other Services      Precautions / Restrictions Precautions Precautions: Fall Precaution Comments: new onset diplopia       Mobility Bed Mobility Overal bed mobility: Modified Independent                  Transfers Overall transfer level: Needs assistance   Transfers: Sit to/from Stand Sit to Stand: Supervision              Balance Overall balance assessment: Needs assistance   Sitting balance-Leahy  Scale: Good       Standing balance-Leahy Scale: Fair Standing balance comment: able to release RW to complete grooming in standing/peri care                           ADL either performed or assessed with clinical judgement   ADL                                         General ADL Comments: Completing ADL tasks at RW level with minguard assist. Educated pt on her risk for falls given hospitalization and vision changes and strategies to reduce risk of falls. Recommend pt use RW at home with walker bag and shower/bath in seated position to reduce risk for falls. Pt states she does not have any throw/scatter rugs. Has fall alert system in place.     Vision   Vision Assessment?: Yes Additional Comments: Pt continues to complain of vertical diplopia with diagonal skew in all field with the exception of when looking up. Pt had tried to reduce the width of tape and was again seeing double. Full width 73M Transpore tape repositioned correctly on nasal portion of L lens, which estinguished double image. Educated pt again on progressing partial occlusion - handout reviewed. Pt verbalized understanding regarding need to wait several days until she tries to adjust the tape.   Perception     Praxis      Cognition Arousal/Alertness: Awake/alert  Behavior During Therapy: WFL for tasks assessed/performed Overall Cognitive Status: Within Functional Limits for tasks assessed                                          Exercises     Shoulder Instructions       General Comments Signs/symptoms of CVA reviewed using BeFast. Pt verbalized understanding.    Pertinent Vitals/ Pain       Pain Assessment: No/denies pain  Home Living                                          Prior Functioning/Environment              Frequency  Min 2X/week        Progress Toward Goals  OT Goals(current goals can now be found in the care plan  section)  Progress towards OT goals: Progressing toward goals  Acute Rehab OT Goals Patient Stated Goal: to go home today OT Goal Formulation: With patient Time For Goal Achievement: 01/23/21 Potential to Achieve Goals: Good ADL Goals Pt Will Perform Grooming: with modified independence;standing Pt Will Perform Lower Body Bathing: with modified independence;sit to/from stand Pt Will Perform Lower Body Dressing: with modified independence;sit to/from stand Pt Will Transfer to Toilet: with modified independence;regular height toilet;ambulating Additional ADL Goal #1: Pt will independently verbalize 3 steps in managing  partial occlusion of  L lens to increase functional vision and reduce risk of falls  Plan Discharge plan needs to be updated    Co-evaluation                 AM-PAC OT "6 Clicks" Daily Activity     Outcome Measure   Help from another person eating meals?: None Help from another person taking care of personal grooming?: None Help from another person toileting, which includes using toliet, bedpan, or urinal?: A Little Help from another person bathing (including washing, rinsing, drying)?: A Little Help from another person to put on and taking off regular upper body clothing?: None Help from another person to put on and taking off regular lower body clothing?: A Little 6 Click Score: 21    End of Session Equipment Utilized During Treatment: Gait belt;Rolling walker  OT Visit Diagnosis: Unsteadiness on feet (R26.81);Low vision, both eyes (H54.2);Dizziness and giddiness (R42)   Activity Tolerance Patient tolerated treatment well   Patient Left in chair;with call bell/phone within reach;with chair alarm set   Nurse Communication Mobility status;Other (comment) (DC recommendations; encourage ambulation)        Time: [id number] OT Time Calculation (min): 28 min  Charges: OT General Charges $OT Visit: 1 Visit OT Treatments $Self Care/Home Management : 8-22  mins $Therapeutic Activity: 8-22 mins  Maurie Boettcher, OT/L   Acute OT Clinical Specialist Acute Rehabilitation Services Pager [phone] Office [phone]    [facility] 01/10/2021, 11:33 AM

## 2021-01-10 NOTE — Progress Notes (Signed)
Physical Therapy Treatment Patient Details Name: [name] MRN: [id number] DOB: 14-Dec-1940 Today's Date: 01/10/2021    History of Present Illness 80 y.o. female presented with c/o 7 day h/o "constipation"; likely adhesive sbo; 8/10 developed dizziness and diplopia; Head CT negative for acute changes. MRI (-) for acute CVA.  PMH significant of HTN, HLD, DM2, gastric bypass, 2 ventral hernia repairs with recurrence after each.    PT Comments    Pt seated in recliner.  Focused on gt training withy RW.  Pt denies dizziness after modifications to her glasses from OT.  Pt remains unsteady with gt and required cues for RW safety.  She continues to benefit from HHPT but she is refusing services.  Pt HR elevated to 100 bpm, SPO2 90%-92% on RA.       Follow Up Recommendations  Home health PT     Equipment Recommendations  None recommended by PT    Recommendations for Other Services       Precautions / Restrictions Precautions Precautions: Fall Precaution Comments: new onset diplopia Restrictions Weight Bearing Restrictions: No    Mobility  Bed Mobility Overal bed mobility: Modified Independent             General bed mobility comments: Pt seated in recliner.    Transfers Overall transfer level: Needs assistance Equipment used: Rolling walker (2 wheeled) Transfers: Sit to/from Stand Sit to Stand: Supervision         General transfer comment: Cues for hand placement to and from seated surface.  Ambulation/Gait Ambulation/Gait assistance: Supervision Gait Distance (Feet): 100 Feet (x2) Assistive device: Rolling walker (2 wheeled) Gait Pattern/deviations: Step-through pattern;Decreased stride length;Wide base of support     General Gait Details: no overt loss of balance, but pt clearly heavily relying on RW for support and feels unsteady.  Pt at times pushing device too far forward.  Cues to slow down.  Cues to back entirely to seated surface.   Stairs Stairs:  Yes (denies need for stair training reports she has a ramp with B rails to enter her home.)           Wheelchair Mobility    Modified Rankin (Stroke Patients Only)       Balance Overall balance assessment: Needs assistance Sitting-balance support: No upper extremity supported;Feet unsupported Sitting balance-Leahy Scale: Good       Standing balance-Leahy Scale: Fair Standing balance comment: able to release RW to complete grooming in standing/peri care                            Cognition Arousal/Alertness: Awake/alert Behavior During Therapy: WFL for tasks assessed/performed Overall Cognitive Status: Within Functional Limits for tasks assessed                                        Exercises      General Comments General comments (skin integrity, edema, etc.): Signs/symptoms of CVA reviewed suing BeFast. Pt verbalized understanding.      Pertinent Vitals/Pain Pain Assessment: No/denies pain    Home Living                      Prior Function            PT Goals (current goals can now be found in the care plan section) Acute Rehab PT Goals Patient Stated  Goal: to go home today Potential to Achieve Goals: Good Progress towards PT goals: Progressing toward goals    Frequency    Min 3X/week      PT Plan Current plan remains appropriate    Co-evaluation              AM-PAC PT "6 Clicks" Mobility   Outcome Measure  Help needed turning from your back to your side while in a flat bed without using bedrails?: None Help needed moving from lying on your back to sitting on the side of a flat bed without using bedrails?: None Help needed moving to and from a bed to a chair (including a wheelchair)?: A Little Help needed standing up from a chair using your arms (e.g., wheelchair or bedside chair)?: A Little Help needed to walk in hospital room?: A Little Help needed climbing 3-5 steps with a railing? : A Little 6  Click Score: 20    End of Session Equipment Utilized During Treatment: Gait belt Activity Tolerance: Patient tolerated treatment well Patient left: with call bell/phone within reach;in chair;with chair alarm set Nurse Communication: Mobility status PT Visit Diagnosis: Unsteadiness on feet (R26.81)     Time: [id number] PT Time Calculation (min) (ACUTE ONLY): 14 min  Charges:  $Gait Training: 8-22 mins                     Erasmo Leventhal , PTA Acute Rehabilitation Services Pager [phone] Office [phone]    Brittane Grudzinski Eli Hose 01/10/2021, 12:51 PM

## 2021-01-10 NOTE — Plan of Care (Signed)
  Problem: Health Behavior/Discharge Planning: Goal: Ability to manage health-related needs will improve Outcome: Progressing   

## 2021-01-13 ENCOUNTER — Telehealth: Payer: Self-pay | Admitting: *Deleted

## 2021-01-13 NOTE — Discharge Summary (Signed)
[facility] Discharge Summary   Patient: [name] [id number]  PCP: Wardell Honour, MD  Date of admission: 01/02/2021   Date of discharge: 01/10/2021     Discharge Diagnoses:  Principal Problem:   SBO (small bowel obstruction) ([place]) Active Problems:   Controlled diabetes mellitus with nephropathy ([place])   Essential hypertension, benign   Recurrent ventral incisional hernia   Current chronic use of systemic steroids   Admitted From: Home Disposition:  Home   Recommendations for Outpatient Follow-up:  PCP: Follow-up with PCP in 1 week. Follow-up with ophthalmology   Follow-up Information     Wardell Honour, MD. Schedule an appointment as soon as possible for a visit in 1 week(s).   Specialties: Family Medicine, Emergency Medicine Contact information: [place] [address] [phone]         ophthalmology. Schedule an appointment as soon as possible for a visit in 1 week(s).                 Discharge Instructions     Diet - low sodium heart healthy   Complete by: As directed    Increase activity slowly   Complete by: As directed        Diet recommendation: Cardiac diet  Activity: The patient is advised to gradually reintroduce usual activities, as tolerated  Discharge Condition: stable  Code Status: DNR   History of present illness: As per the H and P dictated on admission, " [name] is a 80 y.o. female with medical history significant of HTN, HLD, DM2, prior Roux-en-y gastric bypass in 2007, 2 ventral hernia repairs with recurrence after each.   2 prior admissions for nausea, abdominal fullness, ? SBO, most recently 2015.   Pt presents to ED with c/o 7 day h/o "constipation".  Normally pt has BM daily but hasnt had BM in past 7 days.  Tried taking dulcolax without relief of symptoms, not passing gas, has had nausea, 1 episode of vomiting.   No CP, fevers, chills, dysuria, SOB.  "  Hospital Course:  Summary of  her active problems in the hospital is as following. 1.  Small bowel obstruction. Intractable nausea and vomiting. Constipation. Ventral hernia. General surgery was consulted. Conservative management were performed. CT scan on 8/9 shows no evidence of SBO or ileus. Currently continue with soft diet. Due to persistent nausea and IV Reglan and monitor.  Potentially post obstructive slow gut. Will prescribe short course of Reglan on discharge.   2.  Dizziness Vertical diplopia CT head negative for any acute stroke. Clinically also less likely stroke. Started on meclizine. Has been on meclizine intermittently for the last 3 years. Vestibular rehab evaluation performed, less likely vestibular issue causing dizziness. OT evaluated the patient, appears to have some structural issues with eye  for which patient will benefit from outpatient follow-up with ophthalmology.  Eye patch for now. Discussed with neurology, recommend MRI brain without contrast.  MRI brain negative for acute stroke as well. Likely associated with her ophthalmic condition.  Recommend follow-up with ophthalmology outpatient.  Dizziness is improved.  Nausea resolved.   3.  Type 2 diabetes mellitus, uncontrolled with hyperglycemia without long-term insulin use or complication. Continue home regimen discharge.   4.  HTN On amlodipine. Recommend to discontinue this medication if the patient continues to have nausea secondary to constipation   5.  Chronic steroids On prednisone.  We will continue.   6.  Hypomagnesemia Corrected.  7.  Obesity.  Placing the patient at high risk of poor outcome. Body mass index is 36.58 kg/m.    Nutrition Interventions:       Patient was seen by physical therapy, who recommended Home Health, . On the day of the discharge the patient's vitals were stable, and no other new acute medical condition were reported. The patient was felt safe to be discharge at Home with Home  health.  Consultants: General surgery  Procedures: NG tube  DISCHARGE MEDICATION: Allergies as of 01/10/2021       Reactions   Ciprofloxacin Rash   Advair Diskus [fluticasone-salmeterol] Other (See Comments)   Throat closes   Albuterol    REACTION: closes throat   Alendronate Sodium Itching   Benadryl [diphenhydramine Hcl] Itching   Benzonatate    REACTION: rash/hives   Cephalexin Nausea Only   Gabapentin Other (See Comments)   Loss of memory   Iohexol     Desc: rash and DIF BREATHING   Keflex [cephalexin] Nausea And Vomiting   Morphine Nausea And Vomiting   Other Other (See Comments)   Decongestants- keeps pt awake at night, increased heart rate   Propoxyphene Hcl    Doesn't recall   Reclast [zoledronic Acid] Other (See Comments)   Chest pain   Avapro [irbesartan] Rash   Codeine Nausea And Vomiting, Swelling, Rash   Tessalon Perles Rash   Tramadol Nausea Only, Rash        Medication List     STOP taking these medications    glipiZIDE 10 MG 24 hr tablet Commonly known as: GLUCOTROL XL       TAKE these medications    amLODipine 2.5 MG tablet Commonly known as: NORVASC TAKE 1 TABLET BY MOUTH EVERY DAY   aspirin 81 MG tablet Take 81 mg by mouth daily.   atorvastatin 20 MG tablet Commonly known as: LIPITOR TAKE 1 TABLET BY MOUTH EVERY DAY   cetirizine 10 MG tablet Commonly known as: ZYRTEC Take 10 mg by mouth daily as needed for allergies. For allergies   citalopram 40 MG tablet Commonly known as: CELEXA TAKE 1 TABLET BY MOUTH EVERY DAY FOR DEPRESSION What changed: See the new instructions.   docusate sodium 100 MG capsule Commonly known as: COLACE Take 100 mg by mouth daily.   ferrous sulfate 325 (65 FE) MG tablet Take 325 mg by mouth daily with breakfast.   FreeStyle Libre 2 [facility] Use to test blood sugar three times daily.  Dx: E11.9   FreeStyle Libre 2 Sensor Misc Use to test blood sugar three times daily. Dx: E11.9   furosemide  20 MG tablet Commonly known as: LASIX Take 20 mg by mouth 2 (two) times daily as needed for fluid.   Insulin Syringes (Disposable) U-100 1 ML Misc Use to inject insulin Dx: [id number]   isosorbide mononitrate 30 MG 24 hr tablet Commonly known as: IMDUR TAKE 1 TABLET BY MOUTH EVERY DAY   latanoprost 0.005 % ophthalmic solution Commonly known as: XALATAN Place 1 drop into both eyes daily.   meclizine 25 MG tablet Commonly known as: ANTIVERT Take 1 tablet (25 mg total) by mouth 3 (three) times daily for 4 days. For dizziness What changed:  when to take this reasons to take this   metoCLOPramide 5 MG tablet Commonly known as: Reglan Take 1 tablet (5 mg total) by mouth 3 (three) times daily before meals for 5 days.   nitroGLYCERIN 0.4 MG SL tablet Commonly known as: NITROSTAT Place 0.4 mg under  the tongue every 5 (five) minutes as needed for chest pain.   omeprazole 20 MG capsule Commonly known as: PRILOSEC TAKE 1 CAPSULE BY MOUTH EVERY DAY What changed: how much to take   predniSONE 5 MG tablet Commonly known as: DELTASONE TAKE 1 TABLET BY MOUTH EVERY DAY IN THE MORNING WITH FOOD TO REDUCE PAIN What changed: See the new instructions.   Vitamin D3 125 MCG (5000 UT) Caps Take 5,000 Units by mouth daily. For supplement       ASK your doctor about these medications    donepezil 5 MG tablet Commonly known as: Aricept Take 1 tablet (5 mg total) by mouth at bedtime.   insulin NPH Human 100 UNIT/ML injection Commonly known as: HumuLIN N INJECT 40 UNITS IN THE MORNING AND 40 UNITS IN THE EVENING   losartan-hydrochlorothiazide 50-12.5 MG tablet Commonly known as: HYZAAR TAKE 1 TABLET BY MOUTH EVERY DAY FOR BLOOD PRESSURE        Discharge Exam: Filed Weights   01/02/21 1727  Weight: 90.7 kg   Vitals:   01/10/21 0821 01/10/21 1145  BP: (!) 159/63 (!) 146/65  Pulse: 65 63  Resp: 17 18  Temp: 98.7 F (37.1 C) 98.4 F (36.9 C)  SpO2: 93% 93%   General: Appear  in mild distress, no Rash; Oral Mucosa Clear, moist. no Abnormal Neck Mass Or lumps, Conjunctiva normal  Cardiovascular: S1 and S2 Present, no Murmur, Respiratory: good respiratory effort, Bilateral Air entry present and CTA, no Crackles, no wheezes Abdomen: Bowel Sound present, Soft and no tenderness Extremities: no Pedal edema Neurology: alert and oriented to time, place, and person affect appropriate. no new focal deficit Gait not checked due to patient safety concerns    The results of significant diagnostics from this hospitalization (including imaging, microbiology, ancillary and laboratory) are listed below for reference.    Significant Diagnostic Studies: CT ABDOMEN PELVIS WO CONTRAST  Result Date: 01/07/2021 CLINICAL DATA:  Insert abdominal abscess. Small-bowel obstruction versus ileus. EXAM: CT ABDOMEN AND PELVIS WITHOUT CONTRAST TECHNIQUE: Multidetector CT imaging of the abdomen and pelvis was performed following the standard protocol without IV contrast. COMPARISON:  01/02/2021 FINDINGS: Lower chest: Unremarkable. Hepatobiliary: No suspicious focal abnormality within the liver parenchyma. Tiny layering gallstones evident. There is no evidence for gallstones, gallbladder wall thickening, or pericholecystic fluid. Pancreas: No focal mass lesion. No dilatation of the main duct. No intraparenchymal cyst. No peripancreatic edema. Spleen: No splenomegaly. No focal mass lesion. Adrenals/Urinary Tract: No adrenal nodule or mass. Stable exophytic lesion lower pole right kidney, likely a cyst. No interval change in the exophytic lesions characterized previously in the left kidney. No evidence for hydroureter. The urinary bladder appears normal for the degree of distention. Stomach/Bowel: Status post gastric bypass. Roux limb is nondilated. No small bowel wall thickening. No small bowel dilatation. Areas of mild small bowel distension associated with anastomotic staple lines is most likely related to  atony. The terminal ileum is normal. The appendix is not well visualized, but there is no edema or inflammation in the region of the cecum. No gross colonic mass. No colonic wall thickening. Vascular/Lymphatic: There is moderate atherosclerotic calcification of the abdominal aorta without aneurysm. There is no gastrohepatic or hepatoduodenal ligament lymphadenopathy. No retroperitoneal or mesenteric lymphadenopathy. No pelvic sidewall lymphadenopathy. Reproductive: Uterus surgically absent.  There is no adnexal mass. Other: Previously measured 4.9 x 6.0 cm focus of soft tissue attenuation and calcification in the gastrocolic ligament is stable, likely an old omental infarct. No  intraperitoneal free fluid. Musculoskeletal: No worrisome lytic or sclerotic osseous abnormality. Ventral hernia seen previously has not been entirely included on today's exam. IMPRESSION: 1. No acute findings in the abdomen or pelvis. Specifically, no evidence for bowel obstruction. The distended small bowel loops seen on the previous study included areas of anastomosis and most likely reflected atony related to the anastomotic sites. Distension in these regions seen previously has decreased in the interval. 2. Ventral hernia incompletely visualized on today's study. No complicating features within the visualized portion. 3. Cholelithiasis. 4. Aortic Atherosclerosis (ICD10-I70.0). Electronically Signed   By: Misty Stanley M.D.   On: 01/07/2021 19:01   CT Abdomen Pelvis Wo Contrast  Addendum Date: 01/02/2021   ADDENDUM REPORT: 01/02/2021 21:21 ADDENDUM: No definite internal hernia identified. Consider PO contrast administration with x-ray small-bowel follow-through to evaluate for passage of PO contrast past the small bowel into the large bowel. These results were called by telephone at the time of interpretation on 01/02/2021 at 9:18 pm to provider Dr. Dema Severin from surgery, who verbally acknowledged these results. Discussed similar findings  and follow-up imaging with PA Margarita Mail and ED Attending earlier. Electronically Signed   By: Iven Finn M.D.   On: 01/02/2021 21:21   Addendum Date: 01/02/2021   ADDENDUM REPORT: 01/02/2021 18:51 ADDENDUM: These results were called by telephone at the time of interpretation on 01/02/2021 at 6:51 pm to provider [name] , who verbally acknowledged these results. Electronically Signed   By: Iven Finn M.D.   On: 01/02/2021 18:51   Result Date: 01/02/2021 CLINICAL DATA:  Nausea and vomiting. EXAM: CT ABDOMEN AND PELVIS WITHOUT CONTRAST TECHNIQUE: Multidetector CT imaging of the abdomen and pelvis was performed following the standard protocol without IV contrast. COMPARISON:  CT abdomen pelvis 08/19/2014, CT abdomen pelvis 01/24/2009, CT abdomen pelvis 07/24/2004 FINDINGS: Lower chest: No acute abnormality. Hepatobiliary: No focal liver abnormality. Calcified gallstones within the gallbladder lumen. No associated gallbladder wall thickening or pericholecystic fluid. No biliary dilatation. Pancreas: No focal lesion. Normal pancreatic contour. No surrounding inflammatory changes. No main pancreatic ductal dilatation. Spleen: Normal in size without focal abnormality. Adrenals/Urinary Tract: No adrenal nodule bilaterally. Bilateral perinephric stranding nonspecific. No nephrolithiasis and no hydronephrosis. Interval increase in size of an exophytic 6.3 cm (from 2.2 cm) left renal lesion with a density of 20 Hounsfield units. Slight interval increase in size of a smaller fluid density lesion measuring up to 4 cm. Subcentimeter hypodensities too small to characterize. There is a 1 cm exophytic lesion along the inferior pole of the right kidney with a density of simple fluid. There is interval increase in size an exophytic inferior right renal pole 5.3 cm lesion with a density of 22 Hounsfield units. Subcentimeter hyperdensity along the superior pole of the right kidney (6:90) too small to characterize.  No ureterolithiasis or hydroureter. The urinary bladder is unremarkable. Stomach/Bowel: Surgical changes related to Roux-en-Y gastric bypass. Stomach is within normal limits. Several loops of dilated small bowel along the anastomotic sutures in the mid abdomen with associated fecalized material. The caliber of the small bowel measures up to 6.6 cm (3:36) as well as 6.4 cm (3:45) no small bowel wall thickening. No pneumatosis. No evidence of large bowel wall thickening or dilatation. Appendix appears normal. Vascular/Lymphatic: No abdominal aorta or iliac aneurysm. Mild atherosclerotic plaque of the aorta and its branches. No abdominal, pelvic, or inguinal lymphadenopathy. Reproductive: Status post hysterectomy. No adnexal masses. Other: Interval increase in size of a lobulated partially calcified soft tissue  density within the anterior upper abdomen measuring up to 4.9 x 6 cm (3:18) slowly enlarged but visualized at least as far back as 2010. No intraperitoneal free fluid. No intraperitoneal free gas. No organized fluid collection. Musculoskeletal: Ventral wall hernia repair with mesh with diastasis rectus and recurrence of the ventral wall hernia with a large abdominal defect. The hernia contains a loop of transverse colon as well as several loops of small bowel, including the loops involving the obstruction. No suspicious lytic or blastic osseous lesions. No acute displaced fracture. Multilevel degenerative changes of the spine. IMPRESSION: 1. Several loops of dilated small bowel along the jejunojejunosotmy and small bowel resection anastomotic sutures in the mid abdomen with associated luminal fecalized material in a patient status post Roux-en-Y gastric bypass and small bowel resection. Findings may represent early/partial small bowel obstruction possibly due to adhesions. No bowel perforation. No pneumatosis. 2. Ventral wall hernia repair with mesh with diastasis rectus and recurrence of the ventral wall hernia  with a large abdominal defect. The hernia contains a loop of transverse colon as well as several loops of small bowel, including the loops involving the obstruction. The hernia is not the cause of the obstruction. 3. Interval increase in size of several indeterminate renal lesions. Recommend nonemergent MRI renal protocol for further evaluation. 4. Cholelithiasis with no findings of acute cholecystitis. 5. Interval increase in size (compared to 2010 in 2016) of a lobulated partially calcified soft tissue density within the upper abdomen measuring up to 4.9 x 6 cm. Finding may represent postsurgical changes. Electronically Signed: By: Iven Finn M.D. On: 01/02/2021 18:46   CT HEAD WO CONTRAST (5MM)  Result Date: 01/08/2021 CLINICAL DATA:  Dizziness EXAM: CT HEAD WITHOUT CONTRAST TECHNIQUE: Contiguous axial images were obtained from the base of the skull through the vertex without intravenous contrast. COMPARISON:  CT head 11/07/2019 FINDINGS: Brain: Generalized cortical atrophy over the convexity. Negative for hydrocephalus. Mild white matter hypodensity bilaterally. Negative for acute infarct, hemorrhage, mass. Vascular: Atherosclerotic calcification carotid and vertebral arteries. Negative for hyperdense vessel Skull: Negative Sinuses/Orbits: Complete opacification right sphenoid sinus with bony thickening. Air-fluid level left sphenoid sinus. Mucosal edema in the frontal and ethmoid sinuses and left maxillary sinus. Bilateral cataract extraction Other: None IMPRESSION: No acute abnormality. Mild atrophy and mild chronic white matter changes. Electronically Signed   By: Franchot Gallo M.D.   On: 01/08/2021 17:56   MR BRAIN WO CONTRAST  Result Date: 01/09/2021 CLINICAL DATA:  Dizziness. EXAM: MRI HEAD WITHOUT CONTRAST TECHNIQUE: Multiplanar, multiecho pulse sequences of the brain and surrounding structures were obtained without intravenous contrast. COMPARISON:  Head CT 01/08/2021 and MRI 10/19/2014  FINDINGS: Brain: There is no evidence of an acute infarct, intracranial hemorrhage, mass, midline shift, or extra-axial fluid collection. Small T2 hyperintensities in the cerebral white matter and patchy T2 hyperintensities in the pons have mildly progressed from the prior MRI and are nonspecific but compatible with chronic small vessel ischemic disease. Cerebral atrophy is mild for age. Vascular: Major intracranial vascular flow voids are preserved. Skull and upper cervical spine: Unremarkable bone marrow signal. Sinuses/Orbits: Bilateral cataract extraction. Chronic right sphenoid sinusitis with complete opacification of the sinus by complex material. Mild mucosal thickening in the other paranasal sinuses. Possible small polyp in the right nasal cavity. No significant mastoid fluid. Other: None. IMPRESSION: 1. No acute intracranial abnormality. 2. Mild chronic small vessel ischemic disease. Electronically Signed   By: Logan Bores M.D.   On: 01/09/2021 21:10   DG Abd  Portable 1V-Small Bowel Obstruction Protocol-initial, 8 hr delay  Result Date: 01/03/2021 CLINICAL DATA:  8 hour delay.  Small bowel obstruction. EXAM: PORTABLE ABDOMEN - 1 VIEW COMPARISON:  CT from yesterday FINDINGS: Enteric contrast has reached the distal colon. There are still dilated small bowel loops in the central abdomen. No concerning mass effect or gas collection. Osteopenia and atherosclerosis. IMPRESSION: Partial small bowel obstruction. Enteric contrast has reached the nondilated colon. Electronically Signed   By: Monte Fantasia M.D.   On: 01/03/2021 07:10    Microbiology: Recent Results (from the past 240 hour(s))  SARS CORONAVIRUS 2 (TAT 6-24 HRS) Nasopharyngeal Nasopharyngeal Swab     Status: None   Collection Time: 01/06/21  2:05 PM   Specimen: Nasopharyngeal Swab  Result Value Ref Range Status   SARS Coronavirus 2 NEGATIVE NEGATIVE Final    Comment: (NOTE) SARS-CoV-2 target nucleic acids are NOT DETECTED.  The  SARS-CoV-2 RNA is generally detectable in upper and lower respiratory specimens during the acute phase of infection. Negative results do not preclude SARS-CoV-2 infection, do not rule out co-infections with other pathogens, and should not be used as the sole basis for treatment or other patient management decisions. Negative results must be combined with clinical observations, patient history, and epidemiological information. The expected result is Negative.  Fact Sheet for Patients: SugarRoll.be  Fact Sheet for Healthcare Providers: https://www.woods-mathews.com/  This test is not yet approved or cleared by the [place] FDA and  has been authorized for detection and/or diagnosis of SARS-CoV-2 by FDA under an Emergency Use Authorization (EUA). This EUA will remain  in effect (meaning this test can be used) for the duration of the COVID-19 declaration under Se ction 564(b)(1) of the Act, 21 U.S.C. section 360bbb-3(b)(1), unless the authorization is terminated or revoked sooner.  Performed at [facility], [place] [address], [place], [place] [address]      Labs: CBC: Recent Labs  Lab 01/08/21 0026 01/09/21 0034 01/10/21 0135  WBC 9.0 8.5 7.8  NEUTROABS 4.9 4.7 4.5  HGB 11.9* 11.4* 11.8*  HCT 38.1 36.3 37.1  MCV 94.8 95.0 93.7  PLT 274 269 [id number]   Basic Metabolic Panel: Recent Labs  Lab 01/08/21 0026 01/09/21 0034 01/10/21 0135  NA 140 139 137  K 3.9 4.4 4.0  CL 95* 99 98  CO2 34* 35* 33*  GLUCOSE 117* 158* 194*  BUN '12 11 17  '$ CREATININE 0.99 0.92 1.09*  CALCIUM 9.0 9.1 9.0  MG 1.8  --   --   PHOS 4.0  --   --    Liver Function Tests: Recent Labs  Lab 01/08/21 0026 01/09/21 0034 01/10/21 0135  AST '19 15 19  '$ ALT '24 22 24  '$ ALKPHOS 65 63 72  BILITOT 0.5 0.4 0.5  PROT 5.2* 5.0* 5.1*  ALBUMIN 2.8* 2.6* 2.6*   CBG: Recent Labs  Lab 01/09/21 1206 01/09/21 1647 01/09/21 2150 01/10/21 0819 01/10/21 1140   GLUCAP 197* 245* 158* 182* 240*    Time spent: 35 minutes  Signed:  Berle Mull  [facility] 01/10/2021

## 2021-01-13 NOTE — Telephone Encounter (Signed)
Transition Care Management Follow-up Telephone Call Date of discharge and from where: 01/10/2021 [place]  How have you been since you were released from the hospital? Better but still having Bowel issues.  Any questions or concerns? No  Items Reviewed: Did the pt receive and understand the discharge instructions provided? Yes  Medications obtained and verified? Yes  Other? No  Any new allergies since your discharge? No  Dietary orders reviewed? Yes Do you have support at home? Yes   Home Care and Equipment/Supplies: Were home health services ordered? Patient refused Home Health If so, what is the name of the agency?   Has the agency set up a time to come to the patient's home? not applicable Were any new equipment or medical supplies ordered?  No What is the name of the medical supply agency?  Were you able to get the supplies/equipment? not applicable Do you have any questions related to the use of the equipment or supplies? No  Functional Questionnaire: (I = Independent and D = Dependent) ADLs: I  Bathing/Dressing- I  Meal Prep- I  Eating- I  Maintaining continence- I  Transferring/Ambulation- I with assistance. Uses [facility] outside of home  Managing Meds- I  Follow up appointments reviewed:  PCP Hospital f/u appt confirmed? Yes  Scheduled to see Dinah on 01/14/2021 @ 10:30. [facility] f/u appt confirmed? No   Are transportation arrangements needed? No  If their condition worsens, is the pt aware to call PCP or go to the Emergency Dept.? Yes Was the patient provided with contact information for the PCP's office or ED? Yes Was to pt encouraged to call back with questions or concerns? Yes

## 2021-01-14 ENCOUNTER — Encounter: Payer: Self-pay | Admitting: Family

## 2021-01-14 ENCOUNTER — Encounter: Payer: Medicare Other | Admitting: Family

## 2021-01-20 NOTE — Progress Notes (Signed)
This encounter was created in error - please disregard.

## 2021-01-28 DIAGNOSIS — H40053 Ocular hypertension, bilateral: Secondary | ICD-10-CM | POA: Diagnosis not present

## 2021-01-28 DIAGNOSIS — H18413 Arcus senilis, bilateral: Secondary | ICD-10-CM | POA: Diagnosis not present

## 2021-01-28 DIAGNOSIS — Z961 Presence of intraocular lens: Secondary | ICD-10-CM | POA: Diagnosis not present

## 2021-01-28 DIAGNOSIS — H16223 Keratoconjunctivitis sicca, not specified as Sjogren's, bilateral: Secondary | ICD-10-CM | POA: Diagnosis not present

## 2021-01-29 ENCOUNTER — Other Ambulatory Visit: Payer: Self-pay | Admitting: Physician Assistant

## 2021-01-29 ENCOUNTER — Other Ambulatory Visit: Payer: Self-pay | Admitting: Cardiovascular Disease

## 2021-01-29 DIAGNOSIS — Z23 Encounter for immunization: Secondary | ICD-10-CM | POA: Diagnosis not present

## 2021-01-30 ENCOUNTER — Other Ambulatory Visit: Payer: Self-pay | Admitting: Cardiovascular Disease

## 2021-01-31 ENCOUNTER — Other Ambulatory Visit: Payer: Self-pay | Admitting: Family Medicine

## 2021-01-31 DIAGNOSIS — E1121 Type 2 diabetes mellitus with diabetic nephropathy: Secondary | ICD-10-CM

## 2021-01-31 NOTE — Progress Notes (Signed)
Needs update on DM management

## 2021-02-10 ENCOUNTER — Other Ambulatory Visit: Payer: Self-pay

## 2021-02-10 ENCOUNTER — Other Ambulatory Visit: Payer: Medicare Other

## 2021-02-10 DIAGNOSIS — E1121 Type 2 diabetes mellitus with diabetic nephropathy: Secondary | ICD-10-CM | POA: Diagnosis not present

## 2021-02-11 LAB — HEMOGLOBIN A1C
Hgb A1c MFr Bld: 6.4 % of total Hgb — ABNORMAL HIGH (ref <5.7)
Mean Plasma Glucose: 137 mg/dL
eAG (mmol/L): 7.6 mmol/L

## 2021-02-12 ENCOUNTER — Other Ambulatory Visit: Payer: Self-pay

## 2021-02-12 ENCOUNTER — Ambulatory Visit (INDEPENDENT_AMBULATORY_CARE_PROVIDER_SITE_OTHER): Payer: Medicare Other | Admitting: Family Medicine

## 2021-02-12 ENCOUNTER — Encounter: Payer: Self-pay | Admitting: Family Medicine

## 2021-02-12 VITALS — BP 124/78 | HR 80 | Temp 97.3°F | Ht 62.0 in | Wt 194.4 lb

## 2021-02-12 DIAGNOSIS — E1040 Type 1 diabetes mellitus with diabetic neuropathy, unspecified: Secondary | ICD-10-CM | POA: Diagnosis not present

## 2021-02-12 DIAGNOSIS — I10 Essential (primary) hypertension: Secondary | ICD-10-CM | POA: Diagnosis not present

## 2021-02-12 DIAGNOSIS — G3184 Mild cognitive impairment, so stated: Secondary | ICD-10-CM | POA: Diagnosis not present

## 2021-02-12 DIAGNOSIS — E1121 Type 2 diabetes mellitus with diabetic nephropathy: Secondary | ICD-10-CM

## 2021-02-12 DIAGNOSIS — I872 Venous insufficiency (chronic) (peripheral): Secondary | ICD-10-CM | POA: Diagnosis not present

## 2021-02-12 DIAGNOSIS — Z7952 Long term (current) use of systemic steroids: Secondary | ICD-10-CM

## 2021-02-12 DIAGNOSIS — I25119 Atherosclerotic heart disease of native coronary artery with unspecified angina pectoris: Secondary | ICD-10-CM

## 2021-02-12 MED ORDER — PREGABALIN 25 MG PO CAPS: 25.0000 mg | ORAL_CAPSULE | Freq: Two times a day (BID) | ORAL | 0 refills | Status: DC

## 2021-02-12 MED ORDER — TRIAMTERENE-HCTZ 37.5-25 MG PO TABS
1.0000 | ORAL_TABLET | Freq: Every day | ORAL | 3 refills | Status: DC
Start: 2021-02-12 — End: 2021-07-18

## 2021-02-12 NOTE — Progress Notes (Signed)
[place]   Provider:  Alain Honey, MD  Careteam: Patient Care Team: Wardell Honour, MD as PCP - General (Family Medicine) Sherren Mocha, MD as PCP - Cardiology (Cardiology) Excell Seltzer, MD (Inactive) as Consulting Physician (General Surgery) Tanda Rockers, MD as Consulting Physician (Pulmonary Disease) Ronald Lobo, MD as Consulting Physician (Gastroenterology) Jacolyn Reedy, MD as Consulting Physician (Cardiology) Hurman Horn, MD as Consulting Physician (Ophthalmology) Darleen Crocker, MD as Consulting Physician (Ophthalmology)  PLACE OF SERVICE:  [place]  Advanced Directive information    Allergies  Allergen Reactions   Ciprofloxacin Rash   Advair Diskus [Fluticasone-Salmeterol] Other (See Comments)    Throat closes   Albuterol     REACTION: closes throat   Alendronate Sodium Itching   Benadryl [Diphenhydramine Hcl] Itching   Benzonatate     REACTION: rash/hives   Cephalexin Nausea Only   Gabapentin Other (See Comments)    Loss of memory   Iohexol      Desc: rash and DIF BREATHING    Keflex [Cephalexin] Nausea And Vomiting   Morphine Nausea And Vomiting   Other Other (See Comments)    Decongestants- keeps pt awake at night, increased heart rate   Propoxyphene Hcl     Doesn't recall   Reclast [Zoledronic Acid] Other (See Comments)    Chest pain   Avapro [Irbesartan] Rash   Codeine Nausea And Vomiting, Swelling and Rash   Tessalon Perles Rash   Tramadol Nausea Only and Rash    Chief Complaint  Patient presents with   Medical Management of Chronic Issues    Patient presents today for 4 month follow-up.   Quality Metric Gaps    Foot exam     HPI: Patient is a 80 y.o. female .  Returns today for routine visit and follow-up of chronic problems including diabetes, hypertension, dementia, she had was hospitalized August 4 to August 12 with small bowel obstruction which resolved.  She complains today of persistent lower extremity edema  but now also complains of neuropathy in her feet.  She had previously tried gabapentin without relief.  Most recent hemoglobin A1c was 6.4.  She had been having some hypoglycemia and the oral sulfonylurea was discontinued.  She does continue to use NPH insulin twice a day and varies dose depending on what her sugars are running which she routinely checks about twice per day.  Review of Systems:  Review of Systems  Constitutional: Negative.   Eyes: Negative.   Respiratory: Negative.    Cardiovascular:  Positive for leg swelling.  Psychiatric/Behavioral:  Positive for memory loss.   All other systems reviewed and are negative.  Past Medical History:  Diagnosis Date   Abdominal pain, unspecified site    Abnormality of gait    Apnea    Cardiomegaly    Chest pain, unspecified    Complication of anesthesia    hard time waking up   Depressive disorder, not elsewhere classified    Diabetes mellitus    Diabetic retinopathy (HCC)    Dizziness and giddiness    Dyskinesia of esophagus    Edema    Extrinsic asthma, unspecified    Female stress incontinence    Gout, unspecified    High cholesterol    Hypertension    Lipoma of other skin and subcutaneous tissue    Lumbago    Memory loss    Migraine without aura, with intractable migraine, so stated, with status migrainosus    Mild cognitive impairment, so stated  Nonspecific (abnormal) findings on radiological and other examination of abdominal area, including retroperitoneum    Nonspecific abnormal results of liver function study    Obesity, unspecified    Obstructive chronic bronchitis with exacerbation (HCC)    Other and unspecified hyperlipidemia    Other B-complex deficiencies    Other nonspecific abnormal serum enzyme levels    Other specified cardiac dysrhythmias(427.89)    Other specified disease of sebaceous glands    Other symptoms involving cardiovascular system    Pain in joint, ankle and foot    Pain in joint, pelvic  region and thigh    Pain in joint, shoulder region    Palpitations    Reflux esophagitis    Shortness of breath    Tension headache    Type I (juvenile type) diabetes mellitus without mention of complication, not stated as uncontrolled    Type I (juvenile type) diabetes mellitus without mention of complication, uncontrolled    Unspecified essential hypertension    Unspecified hypothyroidism    Unspecified vitamin D deficiency    Ventral hernia, unspecified, without mention of obstruction or gangrene    Past Surgical History:  Procedure Laterality Date   ABDOMINAL HYSTERECTOMY     APPENDECTOMY     BREAST SURGERY     EXPLORATORY LAPAROTOMY W/ BOWEL RESECTION  07/25/2004   PROCEDURE:  Laparoscopy, open laparotomy, resection of jejunojejunostomy   INCISIONAL HERNIA REPAIR  03/19/2006   PROCEDURE:  Open ventral hernia repair with mesh.   LAPAROSCOPIC GASTRIC BYPASS  07/22/2004   PROCEDURE:  Laparoscopic Roux-en-Y gastric bypass, antecolic, antegastric,   LAPAROSCOPIC INCISIONAL / UMBILICAL / VENTRAL HERNIA REPAIR  09/22/2005   PROCEDURE:  Laparoscopic ventral hernia repair with mesh.   LEFT HEART CATH AND CORONARY ANGIOGRAPHY N/A 11/20/2016   Procedure: Left Heart Cath and Coronary Angiography;  Surgeon: Sherren Mocha, MD;  Location: [facility];  Service: Cardiovascular;  Laterality: N/A;   LEFT HEART CATHETERIZATION WITH CORONARY ANGIOGRAM N/A 08/21/2014   Procedure: LEFT HEART CATHETERIZATION WITH CORONARY ANGIOGRAM;  Surgeon: Sherren Mocha, MD;  Location: [facility];  Service: Cardiovascular;  Laterality: N/A;   stents     last time she said was 1 yr ago    Social History:   reports that she has never smoked. She has never used smokeless tobacco. She reports that she does not drink alcohol and does not use drugs.  Family History  Problem Relation Age of Onset   Stroke Mother    Stroke Father    Diabetes Father    Heart disease Father    Diabetes Son    Cancer Brother         BLADDER   Diabetes Brother     Medications: Patient's Medications  New Prescriptions   No medications on file  Previous Medications   AMLODIPINE (NORVASC) 2.5 MG TABLET    Take 1 tablet (2.5 mg total) by mouth daily. Please make overdue appt with Dr. Burt Knack before anymore refills. Thank you 1st attempt   ASPIRIN 81 MG TABLET    Take 81 mg by mouth daily.   ATORVASTATIN (LIPITOR) 20 MG TABLET    TAKE 1 TABLET BY MOUTH EVERY DAY   CETIRIZINE (ZYRTEC) 10 MG TABLET    Take 10 mg by mouth daily as needed for allergies. For allergies   CHOLECALCIFEROL (VITAMIN D3) 5000 UNITS CAPS    Take 5,000 Units by mouth daily. For supplement   CITALOPRAM (CELEXA) 40 MG TABLET    TAKE  1 TABLET BY MOUTH EVERY DAY FOR DEPRESSION   CONTINUOUS BLOOD GLUC RECEIVER (FREESTYLE LIBRE 2 READER) DEVI    Use to test blood sugar three times daily.  Dx: E11.9   CONTINUOUS BLOOD GLUC SENSOR (FREESTYLE LIBRE 2 SENSOR) MISC    Use to test blood sugar three times daily. Dx: E11.9   DOCUSATE SODIUM (COLACE) 100 MG CAPSULE    Take 100 mg by mouth daily.   DONEPEZIL (ARICEPT) 5 MG TABLET    Take 1 tablet (5 mg total) by mouth at bedtime.   FERROUS SULFATE 325 (65 FE) MG TABLET    Take 325 mg by mouth daily with breakfast.   FUROSEMIDE (LASIX) 20 MG TABLET    Take 20 mg by mouth 2 (two) times daily as needed for fluid.   INSULIN NPH HUMAN (HUMULIN N) 100 UNIT/ML INJECTION    INJECT 40 UNITS IN THE MORNING AND 40 UNITS IN THE EVENING   INSULIN SYRINGES, DISPOSABLE, U-100 1 ML MISC    Use to inject insulin Dx: [id number]   ISOSORBIDE MONONITRATE (IMDUR) 30 MG 24 HR TABLET    TAKE 1 TABLET BY MOUTH EVERY DAY   LATANOPROST (XALATAN) 0.005 % OPHTHALMIC SOLUTION    Place 1 drop into both eyes daily.   LOSARTAN-HYDROCHLOROTHIAZIDE (HYZAAR) 50-12.5 MG TABLET    TAKE 1 TABLET BY MOUTH EVERY DAY FOR BLOOD PRESSURE   METOCLOPRAMIDE (REGLAN) 5 MG TABLET    Take 1 tablet (5 mg total) by mouth 3 (three) times daily before meals for 5  days.   NITROGLYCERIN (NITROSTAT) 0.4 MG SL TABLET    Place 0.4 mg under the tongue every 5 (five) minutes as needed for chest pain.   OMEPRAZOLE (PRILOSEC) 20 MG CAPSULE    TAKE 1 CAPSULE BY MOUTH EVERY DAY   PREDNISONE (DELTASONE) 5 MG TABLET    TAKE 1 TABLET BY MOUTH EVERY DAY IN THE MORNING WITH FOOD TO REDUCE PAIN  Modified Medications   No medications on file  Discontinued Medications   No medications on file    Physical Exam:  Vitals:   02/12/21 0942  Weight: 194 lb 6.4 oz (88.2 kg)  Height: '5\' 2"'$  (1.575 m)   Body mass index is 35.56 kg/m. Wt Readings from Last 3 Encounters:  02/12/21 194 lb 6.4 oz (88.2 kg)  01/02/21 200 lb (90.7 kg)  10/09/20 199 lb 12.8 oz (90.6 kg)    Physical Exam Vitals and nursing note reviewed.  Constitutional:      Appearance: Normal appearance.  HENT:     Right Ear: Tympanic membrane normal.     Left Ear: Tympanic membrane normal.  Cardiovascular:     Rate and Rhythm: Normal rate and regular rhythm.  Pulmonary:     Effort: Pulmonary effort is normal.     Breath sounds: Normal breath sounds.  Musculoskeletal:     Right lower leg: Edema present.     Left lower leg: Edema present.     Comments: Swelling is mostly from knee down to ankle and not involving foot much.  Edema is firm.  Foot exam shows some decreased sensation especially with tuning fork  Neurological:     Mental Status: She is alert and oriented to person, place, and time.    Labs reviewed: Basic Metabolic Panel: Recent Labs    01/06/21 0816 01/08/21 0026 01/09/21 0034 01/10/21 0135  NA 140 140 139 137  K 3.7 3.9 4.4 4.0  CL 100 95* 99 98  CO2 33*  34* 35* 33*  GLUCOSE 175* 117* 158* 194*  BUN '14 12 11 17  '$ CREATININE 0.93 0.99 0.92 1.09*  CALCIUM 9.0 9.0 9.1 9.0  MG 1.6* 1.8  --   --   PHOS 4.0 4.0  --   --    Liver Function Tests: Recent Labs    01/08/21 0026 01/09/21 0034 01/10/21 0135  AST '19 15 19  '$ ALT '24 22 24  '$ ALKPHOS 65 63 72  BILITOT 0.5 0.4  0.5  PROT 5.2* 5.0* 5.1*  ALBUMIN 2.8* 2.6* 2.6*   Recent Labs    01/02/21 1733  LIPASE 27   No results for input(s): AMMONIA in the last 8760 hours. CBC: Recent Labs    01/08/21 0026 01/09/21 0034 01/10/21 0135  WBC 9.0 8.5 7.8  NEUTROABS 4.9 4.7 4.5  HGB 11.9* 11.4* 11.8*  HCT 38.1 36.3 37.1  MCV 94.8 95.0 93.7  PLT 274 269 267   Lipid Panel: Recent Labs    06/06/20 1001 01/06/21 0816  CHOL 134 175  HDL 51 45  LDLCALC 63 73  TRIG 117 283*  CHOLHDL 2.6 3.9   TSH: No results for input(s): TSH in the last 8760 hours. A1C: Lab Results  Component Value Date   HGBA1C 6.4 (H) 02/10/2021     Assessment/Plan  1. Venous insufficiency Swelling swelling is related to venous insufficiency will treat with Maxide since she did not tolerate furosemide previously - triamterene-hydrochlorothiazide (MAXZIDE-25) 37.5-25 MG tablet; Take 1 tablet by mouth daily.  Dispense: 90 tablet; Refill: 3  2. Type 1 diabetes mellitus with diabetic neuropathy, unspecified (HCC) Adding Lyrica for neuropathic pain.  Begin with low-dose and titrate as symptoms dictate - pregabalin (LYRICA) 25 MG capsule; Take 1 capsule (25 mg total) by mouth 2 (two) times daily.  Dispense: 60 capsule; Refill: 0  3. Controlled diabetes mellitus with nephropathy (HCC) No hypoglycemia off sulfonylureas.  A1c is still good at 6.4 earlier this week  4. Current chronic use of systemic steroids Takes prednisone on a daily basis and feels like that is necessary with her chronic pain from arthritis  5. Essential hypertension, benign Blood pressure is good today 124/78 on amlodipine and losartan hydrochlorothiazide  6. MCI (mild cognitive impairment) Oriented x3.  Continues on Aricept 5 mg daily   Alain Honey, MD [place] [phone]

## 2021-02-14 ENCOUNTER — Other Ambulatory Visit: Payer: Self-pay | Admitting: Physician Assistant

## 2021-04-10 ENCOUNTER — Other Ambulatory Visit: Payer: Self-pay | Admitting: *Deleted

## 2021-04-10 MED ORDER — FREESTYLE LIBRE 2 SENSOR MISC: 11 refills | Status: DC

## 2021-04-10 NOTE — Telephone Encounter (Signed)
Patient requested refill

## 2021-05-05 ENCOUNTER — Other Ambulatory Visit: Payer: Self-pay | Admitting: Family

## 2021-05-05 ENCOUNTER — Other Ambulatory Visit: Payer: Self-pay | Admitting: Cardiovascular Disease

## 2021-05-05 ENCOUNTER — Other Ambulatory Visit: Payer: Self-pay | Admitting: Physician Assistant

## 2021-05-05 DIAGNOSIS — G8929 Other chronic pain: Secondary | ICD-10-CM

## 2021-05-05 DIAGNOSIS — M545 Low back pain, unspecified: Secondary | ICD-10-CM

## 2021-05-05 NOTE — Telephone Encounter (Signed)
Last filled on 12/23/2020. It appears that this is a maintenance medication for chronic pain associated with arthritis. I will send to provider to review and approve year supply if necessary.  Side Note: Dr.Miller is out of office on Monday's, will send to covering provider

## 2021-05-06 ENCOUNTER — Other Ambulatory Visit: Payer: Self-pay

## 2021-05-06 DIAGNOSIS — E1121 Type 2 diabetes mellitus with diabetic nephropathy: Secondary | ICD-10-CM

## 2021-05-06 MED ORDER — INSULIN NPH (HUMAN) (ISOPHANE) 100 UNIT/ML ~~LOC~~ SUSP: SUBCUTANEOUS | 3 refills | Status: DC

## 2021-05-07 ENCOUNTER — Emergency Department (HOSPITAL_COMMUNITY): Payer: Medicare Other

## 2021-05-07 ENCOUNTER — Emergency Department (HOSPITAL_COMMUNITY)
Admission: EM | Admit: 2021-05-07 | Discharge: 2021-05-07 | Disposition: A | Discharge status: Home / Self Care | Payer: Medicare Other | Attending: Emergency Medicine | Admitting: Emergency Medicine

## 2021-05-07 DIAGNOSIS — G319 Degenerative disease of nervous system, unspecified: Secondary | ICD-10-CM | POA: Diagnosis not present

## 2021-05-07 DIAGNOSIS — Z5321 Procedure and treatment not carried out due to patient leaving prior to being seen by health care provider: Secondary | ICD-10-CM | POA: Insufficient documentation

## 2021-05-07 DIAGNOSIS — R079 Chest pain, unspecified: Secondary | ICD-10-CM | POA: Diagnosis not present

## 2021-05-07 DIAGNOSIS — R42 Dizziness and giddiness: Secondary | ICD-10-CM | POA: Diagnosis not present

## 2021-05-07 DIAGNOSIS — R0789 Other chest pain: Secondary | ICD-10-CM | POA: Insufficient documentation

## 2021-05-07 DIAGNOSIS — R7309 Other abnormal glucose: Secondary | ICD-10-CM | POA: Insufficient documentation

## 2021-05-07 DIAGNOSIS — I739 Peripheral vascular disease, unspecified: Secondary | ICD-10-CM | POA: Diagnosis not present

## 2021-05-07 LAB — CBC WITH DIFFERENTIAL/PLATELET
Abs Immature Granulocytes: 0.04 10*3/uL (ref 0.00–0.07)
Basophils Absolute: 0.1 10*3/uL (ref 0.0–0.1)
Basophils Relative: 1 %
Eosinophils Absolute: 0.2 10*3/uL (ref 0.0–0.5)
Eosinophils Relative: 2 %
HCT: 41.6 % (ref 36.0–46.0)
Hemoglobin: 12.9 g/dL (ref 12.0–15.0)
Immature Granulocytes: 1 %
Lymphocytes Relative: 27 %
Lymphs Abs: 2.3 10*3/uL (ref 0.7–4.0)
MCH: 28.5 pg (ref 26.0–34.0)
MCHC: 31 g/dL (ref 30.0–36.0)
MCV: 91.8 fL (ref 80.0–100.0)
Monocytes Absolute: 0.5 10*3/uL (ref 0.1–1.0)
Monocytes Relative: 6 %
Neutro Abs: 5.4 10*3/uL (ref 1.7–7.7)
Neutrophils Relative %: 63 %
Platelets: 314 10*3/uL (ref 150–400)
RBC: 4.53 MIL/uL (ref 3.87–5.11)
RDW: 14.4 % (ref 11.5–15.5)
WBC: 8.5 10*3/uL (ref 4.0–10.5)
nRBC: 0 % (ref 0.0–0.2)

## 2021-05-07 LAB — COMPREHENSIVE METABOLIC PANEL
ALT: 33 U/L (ref 0–44)
AST: 34 U/L (ref 15–41)
Albumin: 3.1 g/dL — ABNORMAL LOW (ref 3.5–5.0)
Alkaline Phosphatase: 82 U/L (ref 38–126)
Anion gap: 9 (ref 5–15)
BUN: 12 mg/dL (ref 8–23)
CO2: 25 mmol/L (ref 22–32)
Calcium: 8.9 mg/dL (ref 8.9–10.3)
Chloride: 103 mmol/L (ref 98–111)
Creatinine, Ser: 0.84 mg/dL (ref 0.44–1.00)
GFR, Estimated: >60 mL/min (ref >60)
Glucose, Bld: 158 mg/dL — ABNORMAL HIGH (ref 70–99)
Potassium: 3.5 mmol/L (ref 3.5–5.1)
Sodium: 137 mmol/L (ref 135–145)
Total Bilirubin: 0.7 mg/dL (ref 0.3–1.2)
Total Protein: 5.9 g/dL — ABNORMAL LOW (ref 6.5–8.1)

## 2021-05-07 LAB — CBG MONITORING, ED: Glucose-Capillary: 121 mg/dL — ABNORMAL HIGH (ref 70–99)

## 2021-05-07 LAB — TROPONIN I (HIGH SENSITIVITY)
Troponin I (High Sensitivity): 11 ng/L (ref <18)
Troponin I (High Sensitivity): 15 ng/L (ref <18)
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
## 2021-05-07 NOTE — ED Triage Notes (Signed)
Pt. Stated, Donnald Garre had this pressure in my chest and some buzzing in my head for a week. It seems like its worse.

## 2021-05-07 NOTE — ED Provider Notes (Signed)
Emergency Medicine Provider Triage Evaluation Note  [name] , a 80 y.o. female  was evaluated in triage.  Pt complains of chest pressure that has been intermittent for the past week.  Also complaining of "bzzz in my brain, like when the engine of a car will skip. I will just go out of it for a few seconds and it happens all the time. It's been going on since Thanksgiving."  She does not feel that she is passing out but instead states that "I just stopped functioning very briefly."  She also feels that she can hear the "bzzz" in her brain when this happens.  Denies any seizure-like activity, headache, blurry vision, numbness in arms or legs.  She remains ambulatory per baseline  Review of Systems  Positive: Chest pain Negative: Headache, blurry vision, numbness  Physical Exam  BP (!) 179/80 (BP Location: Right Wrist)   Pulse (!) 57   Temp 99 F (37.2 C) (Oral)   Resp 16   SpO2 97%  Gen:   Awake, no distress   Resp:  Normal effort MSK:   Moves extremities without difficulty  Other:  No facial asymmetry.  Strength 5/5 in bilateral upper and lower extremities.  Pupils equal reactive to light normal sensation to light touch of face, bilateral upper and lower extremities  Medical Decision Making  Medically screening exam initiated at 10:58 AM.  Appropriate orders placed.  [name] was informed that the remainder of the evaluation will be completed by another provider, this initial triage assessment does not replace that evaluation, and the importance of remaining in the ED until their evaluation is complete.  No indication for code stroke, work-up initiated   Delia Heady, PA-C 05/07/21 [place], [place] A, DO 05/07/21 1644

## 2021-05-08 ENCOUNTER — Other Ambulatory Visit: Payer: Medicare Other

## 2021-05-08 ENCOUNTER — Telehealth: Payer: Self-pay | Admitting: *Deleted

## 2021-05-08 MED ORDER — INSULIN REGULAR HUMAN 100 UNIT/ML IJ SOLN: INTRAMUSCULAR | 1 refills | Status: DC

## 2021-05-08 NOTE — Telephone Encounter (Signed)
Tried calling patient but mailbox is full and cannot leave message, she wanted [place] to call and let her know Rx was sent to pharmacy.

## 2021-05-08 NOTE — Telephone Encounter (Signed)
Medication list updated and Rx sent to pharmacy.  Tried calling patient but mailbox is full and cannot leave message tried twice.

## 2021-05-08 NOTE — Telephone Encounter (Signed)
Patient called and stated that she uses Humulin R as needed when her blood sugars are over 300. Stated she take 5 units.  Stated that she does not use this very often and her bottle she has on hand has expired.  Requesting a refill.   Can we add this to her medication list, stated that she has been using this for years.   Humulin R Inject 5 units once daily as needed when blood sugar is over 300.  Is this ok to add to medication list and send to pharmacy.   Please Advise. (Forwarded to [place] due to Dr. Sabra Heck out of office.)

## 2021-05-08 NOTE — Telephone Encounter (Signed)
Yes okay to add and send to pharmacy

## 2021-05-14 ENCOUNTER — Ambulatory Visit: Payer: Medicare Other | Admitting: Family Medicine

## 2021-05-15 ENCOUNTER — Telehealth: Payer: Self-pay | Admitting: Cardiovascular Disease

## 2021-05-15 ENCOUNTER — Other Ambulatory Visit: Payer: Self-pay | Admitting: Cardiovascular Disease

## 2021-05-15 NOTE — Telephone Encounter (Signed)
°*  STAT* If patient is at the pharmacy, call can be transferred to refill team.   1. Which medications need to be refilled? (please list name of each medication and dose if known)  amLODipine (NORVASC) 2.5 MG tablet atorvastatin (LIPITOR) 20 MG tablet  2. Which pharmacy/location (including street and city if local pharmacy) is medication to be sent to? [id number] - [place], [place]  3. Do they need a 30 day or 90 day supply?  90 day supply    Pt has an upcoming appt with Dr. Burt Knack

## 2021-05-23 ENCOUNTER — Other Ambulatory Visit: Payer: Medicare Other

## 2021-05-28 ENCOUNTER — Ambulatory Visit: Payer: Medicare Other | Admitting: Family Medicine

## 2021-05-30 ENCOUNTER — Other Ambulatory Visit: Payer: Self-pay | Admitting: Cardiovascular Disease

## 2021-06-09 ENCOUNTER — Encounter (INDEPENDENT_AMBULATORY_CARE_PROVIDER_SITE_OTHER): Payer: Self-pay | Admitting: Ophthalmology

## 2021-06-09 ENCOUNTER — Other Ambulatory Visit: Payer: Self-pay

## 2021-06-09 ENCOUNTER — Ambulatory Visit (INDEPENDENT_AMBULATORY_CARE_PROVIDER_SITE_OTHER): Payer: Medicare Other | Admitting: Ophthalmology

## 2021-06-09 DIAGNOSIS — H35372 Puckering of macula, left eye: Secondary | ICD-10-CM

## 2021-06-09 DIAGNOSIS — E113491 Type 2 diabetes mellitus with severe nonproliferative diabetic retinopathy without macular edema, right eye: Secondary | ICD-10-CM | POA: Diagnosis not present

## 2021-06-09 DIAGNOSIS — E113392 Type 2 diabetes mellitus with moderate nonproliferative diabetic retinopathy without macular edema, left eye: Secondary | ICD-10-CM | POA: Diagnosis not present

## 2021-06-09 NOTE — Assessment & Plan Note (Signed)
Minor none foveal distorting observe

## 2021-06-09 NOTE — Assessment & Plan Note (Signed)
The nature of moderate nonproliferative diabetic retinopathy was discussed with the patient as well as the need for more frequent follow up to judge for progression. Good blood glucose, blood pressure, and serum lipid control was recommended as well as avoidance of smoking and maintenance of normal weight.  Close follow up with PCP encouraged. °

## 2021-06-09 NOTE — Assessment & Plan Note (Signed)
Stable retinopathy OD

## 2021-06-09 NOTE — Progress Notes (Signed)
06/09/2021     CHIEF COMPLAINT Patient presents for  Chief Complaint  Patient presents with   Retina Follow Up      HISTORY OF PRESENT ILLNESS: [name] is a 81 y.o. female who presents to the clinic today for:   HPI     Retina Follow Up           Diagnosis: Other   Laterality: left eye   Onset: 6 months ago   Severity: mild   Duration: 6 months   Course: stable         Comments   6 mos fu OU oct. Pt states her right eye seems weaker, but no significant changes. Denies new FOL or floaters. Pt is on one eye drop, she states she uses it three times a day in both eyes but does not know the name. Pt is allergic to ciprofloxacin.      Last edited by Laurin Coder on 06/09/2021 10:47 AM.      Referring physician: Wardell Honour, MD [place],  [place] [address]  HISTORICAL INFORMATION:   Selected notes from the MEDICAL RECORD NUMBER    Lab Results  Component Value Date   HGBA1C 6.4 (H) 02/10/2021     CURRENT MEDICATIONS: Current Outpatient Medications (Ophthalmic Drugs)  Medication Sig   latanoprost (XALATAN) 0.005 % ophthalmic solution Place 1 drop into both eyes daily.   No current facility-administered medications for this visit. (Ophthalmic Drugs)   Current Outpatient Medications (Other)  Medication Sig   amLODipine (NORVASC) 2.5 MG tablet TAKE 1 TABLET BY MOUTH EVERY DAY NEED APPT REFILLS [id number]   aspirin 81 MG tablet Take 81 mg by mouth daily.   atorvastatin (LIPITOR) 20 MG tablet TAKE 1 TABLET BY MOUTH EVERY DAY   cetirizine (ZYRTEC) 10 MG tablet Take 10 mg by mouth daily as needed for allergies. For allergies   Cholecalciferol (VITAMIN D3) 5000 UNITS CAPS Take 5,000 Units by mouth daily. For supplement   citalopram (CELEXA) 40 MG tablet TAKE 1 TABLET BY MOUTH EVERY DAY FOR DEPRESSION (Patient taking differently: Take 40 mg by mouth daily.)   Continuous Blood Gluc Receiver (FREESTYLE LIBRE 2 READER) DEVI Use to test blood  sugar three times daily.  Dx: E11.9   Continuous Blood Gluc Sensor (FREESTYLE LIBRE 2 SENSOR) MISC Use to test blood sugar three times daily. Dx: E11.9   docusate sodium (COLACE) 100 MG capsule Take 100 mg by mouth daily.   donepezil (ARICEPT) 5 MG tablet Take 1 tablet (5 mg total) by mouth at bedtime.   ferrous sulfate 325 (65 FE) MG tablet Take 325 mg by mouth daily with breakfast.   furosemide (LASIX) 20 MG tablet Take 20 mg by mouth 2 (two) times daily as needed for fluid.   insulin NPH Human (HUMULIN N) 100 UNIT/ML injection INJECT 40 UNITS IN THE MORNING AND 40 UNITS IN THE EVENING   insulin regular (NOVOLIN R) 100 units/mL injection Inject 5 units Subcutaneous as needed when blood sugar is over 300. Dx:E10.40   Insulin Syringes, Disposable, U-100 1 ML MISC Use to inject insulin Dx: W10.9323   isosorbide mononitrate (IMDUR) 30 MG 24 hr tablet TAKE 1 TABLET BY MOUTH EVERY DAY   losartan-hydrochlorothiazide (HYZAAR) 50-12.5 MG tablet TAKE 1 TABLET BY MOUTH EVERY DAY FOR BLOOD PRESSURE   metoCLOPramide (REGLAN) 5 MG tablet Take 1 tablet (5 mg total) by mouth 3 (three) times daily before meals for 5 days.  nitroGLYCERIN (NITROSTAT) 0.4 MG SL tablet Place 0.4 mg under the tongue every 5 (five) minutes as needed for chest pain.   omeprazole (PRILOSEC) 20 MG capsule TAKE 1 CAPSULE BY MOUTH EVERY DAY (Patient taking differently: Take 20 mg by mouth daily.)   predniSONE (DELTASONE) 5 MG tablet TAKE 1 TABLET BY MOUTH EVERY DAY IN THE MORNING WITH FOOD TO REDUCE PAIN   pregabalin (LYRICA) 25 MG capsule Take 1 capsule (25 mg total) by mouth 2 (two) times daily.   triamterene-hydrochlorothiazide (MAXZIDE-25) 37.5-25 MG tablet Take 1 tablet by mouth daily.   No current facility-administered medications for this visit. (Other)      REVIEW OF SYSTEMS:    ALLERGIES Allergies  Allergen Reactions   Ciprofloxacin Rash   Advair Diskus [Fluticasone-Salmeterol] Other (See Comments)    Throat closes    Albuterol     REACTION: closes throat   Alendronate Sodium Itching   Benadryl [Diphenhydramine Hcl] Itching   Benzonatate     REACTION: rash/hives   Cephalexin Nausea Only   Gabapentin Other (See Comments)    Loss of memory   Iohexol      Desc: rash and DIF BREATHING    Keflex [Cephalexin] Nausea And Vomiting   Morphine Nausea And Vomiting   Other Other (See Comments)    Decongestants- keeps pt awake at night, increased heart rate   Propoxyphene Hcl     Doesn't recall   Reclast [Zoledronic Acid] Other (See Comments)    Chest pain   Avapro [Irbesartan] Rash   Codeine Nausea And Vomiting, Swelling and Rash   Tessalon Perles Rash   Tramadol Nausea Only and Rash    PAST MEDICAL HISTORY Past Medical History:  Diagnosis Date   Abdominal pain, unspecified site    Abnormality of gait    Apnea    Cardiomegaly    Chest pain, unspecified    Complication of anesthesia    hard time waking up   Depressive disorder, not elsewhere classified    Diabetes mellitus    Diabetic retinopathy (HCC)    Dizziness and giddiness    Dyskinesia of esophagus    Edema    Extrinsic asthma, unspecified    Female stress incontinence    Gout, unspecified    High cholesterol    Hypertension    Lipoma of other skin and subcutaneous tissue    Lumbago    Memory loss    Migraine without aura, with intractable migraine, so stated, with status migrainosus    Mild cognitive impairment, so stated    Nonspecific (abnormal) findings on radiological and other examination of abdominal area, including retroperitoneum    Nonspecific abnormal results of liver function study    Obesity, unspecified    Obstructive chronic bronchitis with exacerbation (HCC)    Other and unspecified hyperlipidemia    Other B-complex deficiencies    Other nonspecific abnormal serum enzyme levels    Other specified cardiac dysrhythmias(427.89)    Other specified disease of sebaceous glands    Other symptoms involving  cardiovascular system    Pain in joint, ankle and foot    Pain in joint, pelvic region and thigh    Pain in joint, shoulder region    Palpitations    Reflux esophagitis    Shortness of breath    Tension headache    Type I (juvenile type) diabetes mellitus without mention of complication, not stated as uncontrolled    Type I (juvenile type) diabetes mellitus without mention of complication, uncontrolled  Unspecified essential hypertension    Unspecified hypothyroidism    Unspecified vitamin D deficiency    Ventral hernia, unspecified, without mention of obstruction or gangrene    Past Surgical History:  Procedure Laterality Date   ABDOMINAL HYSTERECTOMY     APPENDECTOMY     BREAST SURGERY     EXPLORATORY LAPAROTOMY W/ BOWEL RESECTION  07/25/2004   PROCEDURE:  Laparoscopy, open laparotomy, resection of jejunojejunostomy   INCISIONAL HERNIA REPAIR  03/19/2006   PROCEDURE:  Open ventral hernia repair with mesh.   LAPAROSCOPIC GASTRIC BYPASS  07/22/2004   PROCEDURE:  Laparoscopic Roux-en-Y gastric bypass, antecolic, antegastric,   LAPAROSCOPIC INCISIONAL / UMBILICAL / VENTRAL HERNIA REPAIR  09/22/2005   PROCEDURE:  Laparoscopic ventral hernia repair with mesh.   LEFT HEART CATH AND CORONARY ANGIOGRAPHY N/A 11/20/2016   Procedure: Left Heart Cath and Coronary Angiography;  Surgeon: Sherren Mocha, MD;  Location: [facility];  Service: Cardiovascular;  Laterality: N/A;   LEFT HEART CATHETERIZATION WITH CORONARY ANGIOGRAM N/A 08/21/2014   Procedure: LEFT HEART CATHETERIZATION WITH CORONARY ANGIOGRAM;  Surgeon: Sherren Mocha, MD;  Location: [facility];  Service: Cardiovascular;  Laterality: N/A;   stents     last time she said was 1 yr ago     FAMILY HISTORY Family History  Problem Relation Age of Onset   Stroke Mother    Stroke Father    Diabetes Father    Heart disease Father    Diabetes Son    Cancer Brother        BLADDER   Diabetes Brother     SOCIAL  HISTORY Social History   Tobacco Use   Smoking status: Never   Smokeless tobacco: Never  Vaping Use   Vaping Use: Never used  Substance Use Topics   Alcohol use: No   Drug use: No         OPHTHALMIC EXAM:  Base Eye Exam     Visual Acuity (ETDRS)       Right Left   Dist cc 20/25 -1 20/20 -1         Tonometry (Tonopen, 10:51 AM)       Right Left   Pressure 18 15         Pupils       Pupils Dark Light Shape React APD   Right PERRL 2 2 Round Minimal None   Left PERRL 2 2 Round Minimal None         Visual Fields (Counting fingers)       Left Right    Full Full         Extraocular Movement       Right Left    Full Full         Neuro/Psych     Oriented x3: Yes   Mood/Affect: Normal         Dilation     Both eyes: 1.0% Mydriacyl, 2.5% Phenylephrine @ 10:51 AM           Slit Lamp and Fundus Exam     External Exam       Right Left   External Normal Normal         Slit Lamp Exam       Right Left   Lids/Lashes Normal Normal   Conjunctiva/Sclera White and quiet White and quiet   Cornea Clear Clear   Anterior Chamber Deep and quiet Deep and quiet   Iris Round and reactive Round and reactive  Lens Posterior chamber intraocular lens, Centered posterior chamber intraocular lens Posterior chamber intraocular lens, Centered posterior chamber intraocular lens   Anterior Vitreous Normal Normal         Fundus Exam       Right Left   Posterior Vitreous Normal Normal   Disc Normal Normal   C/D Ratio 0.15 0.2   Macula Normal, microaneurysms temporal, no CSME Epiretinal membrane, nasal FAZ   Vessels NPDR-Severe NPDR- Moderate   Periphery Normal Normal            IMAGING AND PROCEDURES  Imaging and Procedures for 06/09/21  OCT, Retina - OU - Both Eyes       Right Eye Quality was good. Scan locations included subfoveal. Central Foveal Thickness: 242. Progression has worsened. Findings include abnormal foveal contour.    Left Eye Quality was good. Scan locations included subfoveal. Central Foveal Thickness: 237. Progression has been stable. Findings include abnormal foveal contour, epiretinal membrane.   Notes Micro-CME temporal, not center involved will observe  Epiretinal membrane, nasal to FAZ, not center involved will observe no progression to involve the fovea at this time no active CSME             ASSESSMENT/PLAN:  Macular pucker, left eye Minor none foveal distorting observe  Non-proliferative diabetic retinopathy, severe, right eye (HCC) Stable retinopathy OD  Moderate nonproliferative diabetic retinopathy of left eye ([place]) The nature of moderate nonproliferative diabetic retinopathy was discussed with the patient as well as the need for more frequent follow up to judge for progression. Good blood glucose, blood pressure, and serum lipid control was recommended as well as avoidance of smoking and maintenance of normal weight.  Close follow up with PCP encouraged.      ICD-10-CM   1. Macular pucker, left eye  H35.372 OCT, Retina - OU - Both Eyes    2. Severe nonproliferative diabetic retinopathy of right eye without macular edema associated with type 2 diabetes mellitus ([place])  E11.3491     3. Moderate nonproliferative diabetic retinopathy of left eye without macular edema associated with type 2 diabetes mellitus ([place])  G95.6213       1.  Stable diabetic retinopathy over time.  No impact on acuity at this time.  No active maculopathy  2.  3.  Ophthalmic Meds Ordered this visit:  No orders of the defined types were placed in this encounter.      Return in about 9 months (around 03/09/2022) for DILATE OU, OCT.  There are no Patient Instructions on file for this visit.   Explained the diagnoses, plan, and follow up with the patient and they expressed understanding.  Patient expressed understanding of the importance of proper follow up care.   Clent Demark Fantasy Donald M.D. Diseases  & Surgery of the Retina and Vitreous Retina & Diabetic [place] 06/09/21     Abbreviations: M myopia (nearsighted); A astigmatism; H hyperopia (farsighted); P presbyopia; Mrx spectacle prescription;  CTL contact lenses; OD right eye; OS left eye; OU both eyes  XT exotropia; ET esotropia; PEK punctate epithelial keratitis; PEE punctate epithelial erosions; DES dry eye syndrome; MGD meibomian gland dysfunction; ATs artificial tears; PFAT's preservative free artificial tears; [place] nuclear sclerotic cataract; PSC posterior subcapsular cataract; ERM epi-retinal membrane; PVD posterior vitreous detachment; RD retinal detachment; DM diabetes mellitus; DR diabetic retinopathy; NPDR non-proliferative diabetic retinopathy; PDR proliferative diabetic retinopathy; CSME clinically significant macular edema; DME diabetic macular edema; dbh dot blot hemorrhages; CWS cotton wool spot; POAG primary open  angle glaucoma; C/D cup-to-disc ratio; HVF humphrey visual field; GVF goldmann visual field; OCT optical coherence tomography; IOP intraocular pressure; BRVO Branch retinal vein occlusion; CRVO central retinal vein occlusion; CRAO central retinal artery occlusion; BRAO branch retinal artery occlusion; RT retinal tear; SB scleral buckle; PPV pars plana vitrectomy; VH Vitreous hemorrhage; PRP panretinal laser photocoagulation; IVK intravitreal kenalog; VMT vitreomacular traction; MH Macular hole;  NVD neovascularization of the disc; NVE neovascularization elsewhere; AREDS age related eye disease study; ARMD age related macular degeneration; POAG primary open angle glaucoma; EBMD epithelial/anterior basement membrane dystrophy; ACIOL anterior chamber intraocular lens; IOL intraocular lens; PCIOL posterior chamber intraocular lens; Phaco/IOL phacoemulsification with intraocular lens placement; [place] photorefractive keratectomy; LASIK laser assisted in situ keratomileusis; HTN hypertension; DM diabetes mellitus; COPD chronic  obstructive pulmonary disease

## 2021-06-12 ENCOUNTER — Other Ambulatory Visit: Payer: Medicare Other

## 2021-06-12 ENCOUNTER — Other Ambulatory Visit: Payer: Self-pay | Admitting: Cardiovascular Disease

## 2021-06-12 ENCOUNTER — Other Ambulatory Visit: Payer: Self-pay

## 2021-06-12 DIAGNOSIS — E782 Mixed hyperlipidemia: Secondary | ICD-10-CM

## 2021-06-12 DIAGNOSIS — I10 Essential (primary) hypertension: Secondary | ICD-10-CM | POA: Diagnosis not present

## 2021-06-12 DIAGNOSIS — E1040 Type 1 diabetes mellitus with diabetic neuropathy, unspecified: Secondary | ICD-10-CM

## 2021-06-12 DIAGNOSIS — E11649 Type 2 diabetes mellitus with hypoglycemia without coma: Secondary | ICD-10-CM | POA: Diagnosis not present

## 2021-06-13 LAB — COMPLETE METABOLIC PANEL WITH GFR
AG Ratio: 1.5 (calc) (ref 1.0–2.5)
ALT: 25 U/L (ref 6–29)
AST: 16 U/L (ref 10–35)
Albumin: 3.2 g/dL — ABNORMAL LOW (ref 3.6–5.1)
Alkaline phosphatase (APISO): 78 U/L (ref 37–153)
BUN: 11 mg/dL (ref 7–25)
CO2: 32 mmol/L (ref 20–32)
Calcium: 8.9 mg/dL (ref 8.6–10.4)
Chloride: 107 mmol/L (ref 98–110)
Creat: 0.67 mg/dL (ref 0.60–0.95)
Globulin: 2.2 g/dL (calc) (ref 1.9–3.7)
Glucose, Bld: 91 mg/dL (ref 65–99)
Potassium: 4.3 mmol/L (ref 3.5–5.3)
Sodium: 144 mmol/L (ref 135–146)
Total Bilirubin: 0.7 mg/dL (ref 0.2–1.2)
Total Protein: 5.4 g/dL — ABNORMAL LOW (ref 6.1–8.1)
eGFR: 87 mL/min/{1.73_m2} (ref >=60)

## 2021-06-13 LAB — LIPID PANEL
Cholesterol: 118 mg/dL (ref <200)
HDL: 46 mg/dL — ABNORMAL LOW (ref >=50)
LDL Cholesterol (Calc): 52 mg/dL (calc)
Non-HDL Cholesterol (Calc): 72 mg/dL (calc) (ref <130)
Total CHOL/HDL Ratio: 2.6 (calc) (ref <5.0)
Triglycerides: 115 mg/dL (ref <150)

## 2021-06-13 LAB — CBC WITH DIFFERENTIAL/PLATELET
Absolute Monocytes: 448 cells/uL (ref 200–950)
Basophils Absolute: 70 cells/uL (ref 0–200)
Basophils Relative: 1 %
Eosinophils Absolute: 182 cells/uL (ref 15–500)
Eosinophils Relative: 2.6 %
HCT: 39.1 % (ref 35.0–45.0)
Hemoglobin: 12.5 g/dL (ref 11.7–15.5)
Lymphs Abs: 1995 cells/uL (ref 850–3900)
MCH: 28.9 pg (ref 27.0–33.0)
MCHC: 32 g/dL (ref 32.0–36.0)
MCV: 90.5 fL (ref 80.0–100.0)
MPV: 12.1 fL (ref 7.5–12.5)
Monocytes Relative: 6.4 %
Neutro Abs: 4305 cells/uL (ref 1500–7800)
Neutrophils Relative %: 61.5 %
Platelets: 294 10*3/uL (ref 140–400)
RBC: 4.32 10*6/uL (ref 3.80–5.10)
RDW: 13 % (ref 11.0–15.0)
Total Lymphocyte: 28.5 %
WBC: 7 10*3/uL (ref 3.8–10.8)

## 2021-06-13 LAB — HEMOGLOBIN A1C
Hgb A1c MFr Bld: 6.9 % of total Hgb — ABNORMAL HIGH (ref <5.7)
Mean Plasma Glucose: 151 mg/dL
eAG (mmol/L): 8.4 mmol/L

## 2021-06-17 ENCOUNTER — Ambulatory Visit (INDEPENDENT_AMBULATORY_CARE_PROVIDER_SITE_OTHER): Payer: Medicare Other | Admitting: Family Medicine

## 2021-06-17 ENCOUNTER — Other Ambulatory Visit: Payer: Self-pay

## 2021-06-17 ENCOUNTER — Encounter: Payer: Self-pay | Admitting: Family Medicine

## 2021-06-17 VITALS — BP 144/72 | HR 56 | Temp 97.3°F | Ht 62.0 in | Wt 189.4 lb

## 2021-06-17 DIAGNOSIS — F332 Major depressive disorder, recurrent severe without psychotic features: Secondary | ICD-10-CM

## 2021-06-17 DIAGNOSIS — E782 Mixed hyperlipidemia: Secondary | ICD-10-CM

## 2021-06-17 DIAGNOSIS — G3184 Mild cognitive impairment, so stated: Secondary | ICD-10-CM | POA: Diagnosis not present

## 2021-06-17 DIAGNOSIS — I10 Essential (primary) hypertension: Secondary | ICD-10-CM | POA: Diagnosis not present

## 2021-06-17 NOTE — Patient Instructions (Signed)
Would prefer we treat your diabetes with your long and short acting insulin rather than with the glipizide. For the swelling in your legs, continue to use support stockings as well as elevation and watch your salt intake

## 2021-06-17 NOTE — Progress Notes (Signed)
Provider:  Alain Honey, MD  Careteam: Patient Care Team: Wardell Honour, MD as PCP - General (Family Medicine) Sherren Mocha, MD as PCP - Cardiology (Cardiology) Excell Seltzer, MD (Inactive) as Consulting Physician (General Surgery) Tanda Rockers, MD as Consulting Physician (Pulmonary Disease) Ronald Lobo, MD as Consulting Physician (Gastroenterology) Jacolyn Reedy, MD as Consulting Physician (Cardiology) Hurman Horn, MD as Consulting Physician (Ophthalmology) Darleen Crocker, MD as Consulting Physician (Ophthalmology)  PLACE OF SERVICE:  [place]  Advanced Directive information    Allergies  Allergen Reactions   Ciprofloxacin Rash   Advair Diskus [Fluticasone-Salmeterol] Other (See Comments)    Throat closes   Albuterol     REACTION: closes throat   Alendronate Sodium Itching   Benadryl [Diphenhydramine Hcl] Itching   Benzonatate     REACTION: rash/hives   Cephalexin Nausea Only   Gabapentin Other (See Comments)    Loss of memory   Iohexol      Desc: rash and DIF BREATHING    Keflex [Cephalexin] Nausea And Vomiting   Morphine Nausea And Vomiting   Other Other (See Comments)    Decongestants- keeps pt awake at night, increased heart rate   Propoxyphene Hcl     Doesn't recall   Reclast [Zoledronic Acid] Other (See Comments)    Chest pain   Avapro [Irbesartan] Rash   Codeine Nausea And Vomiting, Swelling and Rash   Tessalon Perles Rash   Tramadol Nausea Only and Rash    Chief Complaint  Patient presents with   Medical Management of Chronic Issues    Patient presents today for a 4 month follow-up.   Quality Metric Gaps    Foot exam     HPI: Patient is a 81 y.o. female .  Routine follow-up for care of chronic diseases which include diabetes hypertension, mild cognitive impairment, venous insufficiency.  She is in the process of moving to [place] and is anxious about that. She has a history of mild cognitive impairment but  continues to live independently and drive.  She writes herself notes to help with memory issues Her main concern today is some swelling in her left leg and to a lesser extent right leg.  She is wearing compression stockings.  There are no symptoms of knee pain.  There is no shortness of breath or chronic kidney disease. We did review her labs from last week and all are good including metabolic panel and lipids as well as A1c  Review of Systems:  Review of Systems  Constitutional: Negative.   Eyes: Negative.   Respiratory: Negative.    Cardiovascular:  Positive for leg swelling.  Gastrointestinal:  Positive for heartburn.  Genitourinary: Negative.   Musculoskeletal: Negative.   Neurological: Negative.   Psychiatric/Behavioral: Negative.    All other systems reviewed and are negative.  Past Medical History:  Diagnosis Date   Abdominal pain, unspecified site    Abnormality of gait    Apnea    Cardiomegaly    Chest pain, unspecified    Complication of anesthesia    hard time waking up   Depressive disorder, not elsewhere classified    Diabetes mellitus    Diabetic retinopathy (HCC)    Dizziness and giddiness    Dyskinesia of esophagus    Edema    Extrinsic asthma, unspecified    Female stress incontinence    Gout, unspecified    High cholesterol    Hypertension    Lipoma of other skin and subcutaneous tissue  Lumbago    Memory loss    Migraine without aura, with intractable migraine, so stated, with status migrainosus    Mild cognitive impairment, so stated    Nonspecific (abnormal) findings on radiological and other examination of abdominal area, including retroperitoneum    Nonspecific abnormal results of liver function study    Obesity, unspecified    Obstructive chronic bronchitis with exacerbation (HCC)    Other and unspecified hyperlipidemia    Other B-complex deficiencies    Other nonspecific abnormal serum enzyme levels    Other specified cardiac  dysrhythmias(427.89)    Other specified disease of sebaceous glands    Other symptoms involving cardiovascular system    Pain in joint, ankle and foot    Pain in joint, pelvic region and thigh    Pain in joint, shoulder region    Palpitations    Reflux esophagitis    Shortness of breath    Tension headache    Type I (juvenile type) diabetes mellitus without mention of complication, not stated as uncontrolled    Type I (juvenile type) diabetes mellitus without mention of complication, uncontrolled    Unspecified essential hypertension    Unspecified hypothyroidism    Unspecified vitamin D deficiency    Ventral hernia, unspecified, without mention of obstruction or gangrene    Past Surgical History:  Procedure Laterality Date   ABDOMINAL HYSTERECTOMY     APPENDECTOMY     BREAST SURGERY     EXPLORATORY LAPAROTOMY W/ BOWEL RESECTION  07/25/2004   PROCEDURE:  Laparoscopy, open laparotomy, resection of jejunojejunostomy   INCISIONAL HERNIA REPAIR  03/19/2006   PROCEDURE:  Open ventral hernia repair with mesh.   LAPAROSCOPIC GASTRIC BYPASS  07/22/2004   PROCEDURE:  Laparoscopic Roux-en-Y gastric bypass, antecolic, antegastric,   LAPAROSCOPIC INCISIONAL / UMBILICAL / VENTRAL HERNIA REPAIR  09/22/2005   PROCEDURE:  Laparoscopic ventral hernia repair with mesh.   LEFT HEART CATH AND CORONARY ANGIOGRAPHY N/A 11/20/2016   Procedure: Left Heart Cath and Coronary Angiography;  Surgeon: Sherren Mocha, MD;  Location: [place] [facility];  Service: Cardiovascular;  Laterality: N/A;   LEFT HEART CATHETERIZATION WITH CORONARY ANGIOGRAM N/A 08/21/2014   Procedure: LEFT HEART CATHETERIZATION WITH CORONARY ANGIOGRAM;  Surgeon: Sherren Mocha, MD;  Location: [facility];  Service: Cardiovascular;  Laterality: N/A;   stents     last time she said was 1 yr ago    Social History:   reports that she has never smoked. She has never used smokeless tobacco. She reports that she does not drink alcohol and does  not use drugs.  Family History  Problem Relation Age of Onset   Stroke Mother    Stroke Father    Diabetes Father    Heart disease Father    Diabetes Son    Cancer Brother        BLADDER   Diabetes Brother     Medications: Patient's Medications  New Prescriptions   No medications on file  Previous Medications   AMLODIPINE (NORVASC) 2.5 MG TABLET    TAKE 1 TABLET BY MOUTH EVERY DAY *NEED APPT REFILLS*   ASPIRIN 81 MG TABLET    Take 81 mg by mouth daily.   ATORVASTATIN (LIPITOR) 20 MG TABLET    TAKE 1 TABLET BY MOUTH EVERY DAY   CETIRIZINE (ZYRTEC) 10 MG TABLET    Take 10 mg by mouth daily as needed for allergies. For allergies   CHOLECALCIFEROL (VITAMIN D3) 5000 UNITS CAPS    Take  5,000 Units by mouth daily. For supplement   CITALOPRAM (CELEXA) 40 MG TABLET    TAKE 1 TABLET BY MOUTH EVERY DAY FOR DEPRESSION   CONTINUOUS BLOOD GLUC RECEIVER (FREESTYLE LIBRE 2 READER) DEVI    Use to test blood sugar three times daily.  Dx: E11.9   CONTINUOUS BLOOD GLUC SENSOR (FREESTYLE LIBRE 2 SENSOR) MISC    Use to test blood sugar three times daily. Dx: E11.9   DOCUSATE SODIUM (COLACE) 100 MG CAPSULE    Take 100 mg by mouth daily.   DONEPEZIL (ARICEPT) 5 MG TABLET    Take 1 tablet (5 mg total) by mouth at bedtime.   FERROUS SULFATE 325 (65 FE) MG TABLET    Take 325 mg by mouth daily with breakfast.   FUROSEMIDE (LASIX) 20 MG TABLET    Take 20 mg by mouth 2 (two) times daily as needed for fluid.   INSULIN NPH HUMAN (HUMULIN N) 100 UNIT/ML INJECTION    INJECT 40 UNITS IN THE MORNING AND 40 UNITS IN THE EVENING   INSULIN SYRINGES, DISPOSABLE, U-100 1 ML MISC    Use to inject insulin Dx: Z61.0960   ISOSORBIDE MONONITRATE (IMDUR) 30 MG 24 HR TABLET    TAKE 1 TABLET BY MOUTH EVERY DAY   LATANOPROST (XALATAN) 0.005 % OPHTHALMIC SOLUTION    Place 1 drop into both eyes daily.   LOSARTAN-HYDROCHLOROTHIAZIDE (HYZAAR) 50-12.5 MG TABLET    TAKE 1 TABLET BY MOUTH EVERY DAY FOR BLOOD PRESSURE   METOCLOPRAMIDE  (REGLAN) 5 MG TABLET    Take 1 tablet (5 mg total) by mouth 3 (three) times daily before meals for 5 days.   NITROGLYCERIN (NITROSTAT) 0.4 MG SL TABLET    Place 0.4 mg under the tongue every 5 (five) minutes as needed for chest pain.   OMEPRAZOLE (PRILOSEC) 20 MG CAPSULE    TAKE 1 CAPSULE BY MOUTH EVERY DAY   PREDNISONE (DELTASONE) 5 MG TABLET    TAKE 1 TABLET BY MOUTH EVERY DAY IN THE MORNING WITH FOOD TO REDUCE PAIN   PREGABALIN (LYRICA) 25 MG CAPSULE    Take 1 capsule (25 mg total) by mouth 2 (two) times daily.   TRIAMTERENE-HYDROCHLOROTHIAZIDE (MAXZIDE-25) 37.5-25 MG TABLET    Take 1 tablet by mouth daily.  Modified Medications   No medications on file  Discontinued Medications   INSULIN REGULAR (NOVOLIN R) 100 UNITS/ML INJECTION    Inject 5 units Subcutaneous as needed when blood sugar is over 300. Dx:E10.40    Physical Exam:  Vitals:   06/17/21 0857  Weight: 189 lb 6.4 oz (85.9 kg)  Height: 5\' 2"  (1.575 m)   Body mass index is 34.64 kg/m. Wt Readings from Last 3 Encounters:  06/17/21 189 lb 6.4 oz (85.9 kg)  02/12/21 194 lb 6.4 oz (88.2 kg)  01/02/21 200 lb (90.7 kg)    Physical Exam Vitals and nursing note reviewed.  Constitutional:      Appearance: She is obese.  Cardiovascular:     Rate and Rhythm: Normal rate and regular rhythm.  Pulmonary:     Effort: Pulmonary effort is normal.     Breath sounds: Normal breath sounds.  Abdominal:     General: Abdomen is flat.     Palpations: Abdomen is soft.  Musculoskeletal:     Right lower leg: Edema present.     Left lower leg: Edema present.  Neurological:     General: No focal deficit present.     Mental Status: She is  alert and oriented to person, place, and time.  Psychiatric:        Mood and Affect: Mood normal.        Behavior: Behavior normal.    Labs reviewed: Basic Metabolic Panel: Recent Labs    01/06/21 0816 01/08/21 0026 01/09/21 0034 01/10/21 0135 05/07/21 1103 06/12/21 0930  NA 140 140   < > 137  137 144  K 3.7 3.9   < > 4.0 3.5 4.3  CL 100 95*   < > 98 103 107  CO2 33* 34*   < > 33* 25 32  GLUCOSE 175* 117*   < > 194* 158* 91  BUN 14 12   < > 17 12 11   CREATININE 0.93 0.99   < > 1.09* 0.84 0.67  CALCIUM 9.0 9.0   < > 9.0 8.9 8.9  MG 1.6* 1.8  --   --   --   --   PHOS 4.0 4.0  --   --   --   --    < > = values in this interval not displayed.   Liver Function Tests: Recent Labs    01/09/21 0034 01/10/21 0135 05/07/21 1103 06/12/21 0930  AST 15 19 34 16  ALT 22 24 33 25  ALKPHOS 63 72 82  --   BILITOT 0.4 0.5 0.7 0.7  PROT 5.0* 5.1* 5.9* 5.4*  ALBUMIN 2.6* 2.6* 3.1*  --    Recent Labs    01/02/21 1733  LIPASE 27   No results for input(s): AMMONIA in the last 8760 hours. CBC: Recent Labs    01/10/21 0135 05/07/21 1103 06/12/21 0930  WBC 7.8 8.5 7.0  NEUTROABS 4.5 5.4 4,305  HGB 11.8* 12.9 12.5  HCT 37.1 41.6 39.1  MCV 93.7 91.8 90.5  PLT 267 314 294   Lipid Panel: Recent Labs    01/06/21 0816 06/12/21 0930  CHOL 175 118  HDL 45 46*  LDLCALC 73 52  TRIG 283* 115  CHOLHDL 3.9 2.6   TSH: No results for input(s): TSH in the last 8760 hours. A1C: Lab Results  Component Value Date   HGBA1C 6.9 (H) 06/12/2021     Assessment/Plan  1. Severe episode of recurrent major depressive disorder, without psychotic features ([place]) Continues to take citalopram and symptoms are stable  2. Essential hypertension, benign Blood pressure okay at 144/72 on amlodipine and losartan  3. Mixed hyperlipidemia Lipids are at goal on atorvastatin  4. MCI (mild cognitive impairment) Patient does well independently.  She is not taking Aricept   Alain Honey, MD [place] [phone]

## 2021-06-18 ENCOUNTER — Telehealth: Payer: Self-pay | Admitting: Family Medicine

## 2021-06-18 ENCOUNTER — Other Ambulatory Visit: Payer: Self-pay | Admitting: Family Medicine

## 2021-06-18 DIAGNOSIS — H9193 Unspecified hearing loss, bilateral: Secondary | ICD-10-CM

## 2021-06-18 NOTE — Telephone Encounter (Signed)
Patient called requesting referral to [facility] for audiology & hearing test.  Ms Kilker already has appt sch with [facility] 06/24/21.  Thanks, Vilinda Blanks

## 2021-06-23 ENCOUNTER — Other Ambulatory Visit: Payer: Self-pay | Admitting: *Deleted

## 2021-06-23 ENCOUNTER — Telehealth: Payer: Self-pay | Admitting: Family Medicine

## 2021-06-23 DIAGNOSIS — K219 Gastro-esophageal reflux disease without esophagitis: Secondary | ICD-10-CM

## 2021-06-23 DIAGNOSIS — H9193 Unspecified hearing loss, bilateral: Secondary | ICD-10-CM

## 2021-06-23 MED ORDER — AMLODIPINE BESYLATE 2.5 MG PO TABS: ORAL_TABLET | ORAL | 1 refills | Status: DC

## 2021-06-23 MED ORDER — OMEPRAZOLE 20 MG PO CPDR: 20.0000 mg | DELAYED_RELEASE_CAPSULE | Freq: Every day | ORAL | 1 refills | Status: DC

## 2021-06-23 MED ORDER — CITALOPRAM HYDROBROMIDE 40 MG PO TABS: 40.0000 mg | ORAL_TABLET | Freq: Every day | ORAL | 1 refills | Status: DC

## 2021-06-23 MED ORDER — DONEPEZIL HCL 5 MG PO TABS: 5.0000 mg | ORAL_TABLET | Freq: Every day | ORAL | 1 refills | Status: DC

## 2021-06-23 NOTE — Telephone Encounter (Signed)
Patient called and stated that she is using a new pharmacy and needs refills sent there, [place].   Also stated that she has an appointment tomorrow with [facility] [phone] and needs a referral placed in order for them to see her.   Pended and sent to [facility] for approval due to Dr. Sabra Heck out of office.

## 2021-06-23 NOTE — Telephone Encounter (Signed)
While speaking to pt about hearing referral, she wants [facility] diabetic monitoring system [facility] [place].  Also requesting Brimonidine solution for eyes.  Please advise [name]

## 2021-06-24 DIAGNOSIS — H903 Sensorineural hearing loss, bilateral: Secondary | ICD-10-CM | POA: Diagnosis not present

## 2021-06-24 MED ORDER — FREESTYLE LIBRE 2 SENSOR MISC: 11 refills | Status: DC

## 2021-06-24 MED ORDER — FREESTYLE LIBRE 2 READER DEVI: 0 refills | Status: DC

## 2021-06-24 NOTE — Telephone Encounter (Signed)
Diabetic Monitor sent to pharmacy.   Called patient to ask who prescribes her Brimonidine Solution.   LMOM for patient to return call.

## 2021-07-03 ENCOUNTER — Telehealth: Payer: Self-pay | Admitting: *Deleted

## 2021-07-03 NOTE — Telephone Encounter (Signed)
Patient called and stated that the pharmacy would not give her Atorvastatin because they didn't have a Dx for it. Stated that the insurance would not cover until they had a Dx.   I called pharmacy and spoke with pharmacist and she stated that the Rx was ready for pick up with no charge. She was not sure where the conversation came from.   Patient notified.

## 2021-07-04 ENCOUNTER — Telehealth: Payer: Self-pay | Admitting: Cardiovascular Disease

## 2021-07-04 NOTE — Telephone Encounter (Signed)
Call placed to pharmacy.  They state they have the diagnoses codes.  Returned call to Pt to advise.  She then states the issue was they were going to be charging her more for the atorvastatin then [facility] paid [facility] [place].  Advised she call the pharmacy back.

## 2021-07-04 NOTE — Telephone Encounter (Signed)
Patient called in to say the her pharmacy needs a DX code from [place] for the patient. Please advise

## 2021-07-17 ENCOUNTER — Inpatient Hospital Stay (HOSPITAL_COMMUNITY)
Admission: EM | Admit: 2021-07-17 | Discharge: 2021-07-19 | DRG: 388 | Disposition: A | Discharge status: Home / Self Care | Payer: Medicare Other | Attending: Internal Medicine | Admitting: Internal Medicine

## 2021-07-17 DIAGNOSIS — E7800 Pure hypercholesterolemia, unspecified: Secondary | ICD-10-CM | POA: Diagnosis present

## 2021-07-17 DIAGNOSIS — I10 Essential (primary) hypertension: Secondary | ICD-10-CM | POA: Diagnosis present

## 2021-07-17 DIAGNOSIS — Z881 Allergy status to other antibiotic agents status: Secondary | ICD-10-CM

## 2021-07-17 DIAGNOSIS — R1110 Vomiting, unspecified: Secondary | ICD-10-CM

## 2021-07-17 DIAGNOSIS — Z888 Allergy status to other drugs, medicaments and biological substances status: Secondary | ICD-10-CM | POA: Diagnosis not present

## 2021-07-17 DIAGNOSIS — I214 Non-ST elevation (NSTEMI) myocardial infarction: Secondary | ICD-10-CM | POA: Diagnosis not present

## 2021-07-17 DIAGNOSIS — I959 Hypotension, unspecified: Secondary | ICD-10-CM | POA: Diagnosis not present

## 2021-07-17 DIAGNOSIS — Z8249 Family history of ischemic heart disease and other diseases of the circulatory system: Secondary | ICD-10-CM

## 2021-07-17 DIAGNOSIS — Z7982 Long term (current) use of aspirin: Secondary | ICD-10-CM | POA: Diagnosis not present

## 2021-07-17 DIAGNOSIS — K439 Ventral hernia without obstruction or gangrene: Secondary | ICD-10-CM | POA: Diagnosis not present

## 2021-07-17 DIAGNOSIS — Z885 Allergy status to narcotic agent status: Secondary | ICD-10-CM

## 2021-07-17 DIAGNOSIS — K5669 Other partial intestinal obstruction: Secondary | ICD-10-CM | POA: Diagnosis not present

## 2021-07-17 DIAGNOSIS — Z7952 Long term (current) use of systemic steroids: Secondary | ICD-10-CM | POA: Diagnosis not present

## 2021-07-17 DIAGNOSIS — Z20822 Contact with and (suspected) exposure to covid-19: Secondary | ICD-10-CM | POA: Diagnosis present

## 2021-07-17 DIAGNOSIS — K56609 Unspecified intestinal obstruction, unspecified as to partial versus complete obstruction: Secondary | ICD-10-CM | POA: Diagnosis not present

## 2021-07-17 DIAGNOSIS — Z79899 Other long term (current) drug therapy: Secondary | ICD-10-CM

## 2021-07-17 DIAGNOSIS — E11319 Type 2 diabetes mellitus with unspecified diabetic retinopathy without macular edema: Secondary | ICD-10-CM | POA: Diagnosis present

## 2021-07-17 DIAGNOSIS — G894 Chronic pain syndrome: Secondary | ICD-10-CM | POA: Diagnosis present

## 2021-07-17 DIAGNOSIS — G3184 Mild cognitive impairment, so stated: Secondary | ICD-10-CM | POA: Diagnosis present

## 2021-07-17 DIAGNOSIS — F0390 Unspecified dementia without behavioral disturbance: Secondary | ICD-10-CM | POA: Diagnosis present

## 2021-07-17 DIAGNOSIS — E669 Obesity, unspecified: Secondary | ICD-10-CM | POA: Diagnosis present

## 2021-07-17 DIAGNOSIS — Z8052 Family history of malignant neoplasm of bladder: Secondary | ICD-10-CM

## 2021-07-17 DIAGNOSIS — I25110 Atherosclerotic heart disease of native coronary artery with unstable angina pectoris: Secondary | ICD-10-CM | POA: Diagnosis present

## 2021-07-17 DIAGNOSIS — K8020 Calculus of gallbladder without cholecystitis without obstruction: Secondary | ICD-10-CM | POA: Diagnosis not present

## 2021-07-17 DIAGNOSIS — I160 Hypertensive urgency: Secondary | ICD-10-CM | POA: Diagnosis present

## 2021-07-17 DIAGNOSIS — Z66 Do not resuscitate: Secondary | ICD-10-CM | POA: Diagnosis present

## 2021-07-17 DIAGNOSIS — R531 Weakness: Secondary | ICD-10-CM | POA: Diagnosis not present

## 2021-07-17 DIAGNOSIS — Z0389 Encounter for observation for other suspected diseases and conditions ruled out: Secondary | ICD-10-CM | POA: Diagnosis not present

## 2021-07-17 DIAGNOSIS — Z9884 Bariatric surgery status: Secondary | ICD-10-CM

## 2021-07-17 DIAGNOSIS — M109 Gout, unspecified: Secondary | ICD-10-CM | POA: Diagnosis present

## 2021-07-17 DIAGNOSIS — N281 Cyst of kidney, acquired: Secondary | ICD-10-CM | POA: Diagnosis not present

## 2021-07-17 DIAGNOSIS — Z794 Long term (current) use of insulin: Secondary | ICD-10-CM

## 2021-07-17 DIAGNOSIS — E785 Hyperlipidemia, unspecified: Secondary | ICD-10-CM | POA: Diagnosis present

## 2021-07-17 DIAGNOSIS — R1084 Generalized abdominal pain: Secondary | ICD-10-CM | POA: Diagnosis not present

## 2021-07-17 DIAGNOSIS — Z7984 Long term (current) use of oral hypoglycemic drugs: Secondary | ICD-10-CM

## 2021-07-17 DIAGNOSIS — I25119 Atherosclerotic heart disease of native coronary artery with unspecified angina pectoris: Secondary | ICD-10-CM | POA: Diagnosis not present

## 2021-07-17 DIAGNOSIS — K219 Gastro-esophageal reflux disease without esophagitis: Secondary | ICD-10-CM | POA: Diagnosis present

## 2021-07-17 DIAGNOSIS — Z9889 Other specified postprocedural states: Secondary | ICD-10-CM | POA: Diagnosis not present

## 2021-07-17 DIAGNOSIS — Z6834 Body mass index (BMI) 34.0-34.9, adult: Secondary | ICD-10-CM | POA: Diagnosis not present

## 2021-07-17 DIAGNOSIS — I21A1 Myocardial infarction type 2: Secondary | ICD-10-CM | POA: Diagnosis present

## 2021-07-17 DIAGNOSIS — Z833 Family history of diabetes mellitus: Secondary | ICD-10-CM

## 2021-07-17 DIAGNOSIS — E1121 Type 2 diabetes mellitus with diabetic nephropathy: Secondary | ICD-10-CM | POA: Diagnosis present

## 2021-07-17 DIAGNOSIS — R110 Nausea: Secondary | ICD-10-CM | POA: Diagnosis not present

## 2021-07-17 DIAGNOSIS — R1111 Vomiting without nausea: Secondary | ICD-10-CM | POA: Diagnosis not present

## 2021-07-17 DIAGNOSIS — R112 Nausea with vomiting, unspecified: Secondary | ICD-10-CM | POA: Diagnosis not present

## 2021-07-17 DIAGNOSIS — K56600 Partial intestinal obstruction, unspecified as to cause: Secondary | ICD-10-CM | POA: Diagnosis present

## 2021-07-17 DIAGNOSIS — K432 Incisional hernia without obstruction or gangrene: Secondary | ICD-10-CM | POA: Diagnosis present

## 2021-07-17 DIAGNOSIS — E039 Hypothyroidism, unspecified: Secondary | ICD-10-CM | POA: Diagnosis present

## 2021-07-17 DIAGNOSIS — K6389 Other specified diseases of intestine: Secondary | ICD-10-CM | POA: Diagnosis not present

## 2021-07-17 DIAGNOSIS — R778 Other specified abnormalities of plasma proteins: Secondary | ICD-10-CM | POA: Diagnosis not present

## 2021-07-17 DIAGNOSIS — Z823 Family history of stroke: Secondary | ICD-10-CM

## 2021-07-17 DIAGNOSIS — Z609 Problem related to social environment, unspecified: Secondary | ICD-10-CM

## 2021-07-17 DIAGNOSIS — I517 Cardiomegaly: Secondary | ICD-10-CM | POA: Diagnosis not present

## 2021-07-17 DIAGNOSIS — R7989 Other specified abnormal findings of blood chemistry: Secondary | ICD-10-CM | POA: Diagnosis not present

## 2021-07-17 LAB — COMPREHENSIVE METABOLIC PANEL
ALT: 57 U/L — ABNORMAL HIGH (ref 0–44)
AST: 24 U/L (ref 15–41)
Albumin: 3.5 g/dL (ref 3.5–5.0)
Alkaline Phosphatase: 93 U/L (ref 38–126)
Anion gap: 10 (ref 5–15)
BUN: 15 mg/dL (ref 8–23)
CO2: 26 mmol/L (ref 22–32)
Calcium: 9.2 mg/dL (ref 8.9–10.3)
Chloride: 102 mmol/L (ref 98–111)
Creatinine, Ser: 0.84 mg/dL (ref 0.44–1.00)
GFR, Estimated: >60 mL/min (ref >60)
Glucose, Bld: 213 mg/dL — ABNORMAL HIGH (ref 70–99)
Potassium: 3.6 mmol/L (ref 3.5–5.1)
Sodium: 138 mmol/L (ref 135–145)
Total Bilirubin: 0.6 mg/dL (ref 0.3–1.2)
Total Protein: 6.4 g/dL — ABNORMAL LOW (ref 6.5–8.1)

## 2021-07-17 LAB — CBC
HCT: 44.2 % (ref 36.0–46.0)
Hemoglobin: 13.6 g/dL (ref 12.0–15.0)
MCH: 29.2 pg (ref 26.0–34.0)
MCHC: 30.8 g/dL (ref 30.0–36.0)
MCV: 94.8 fL (ref 80.0–100.0)
Platelets: 304 10*3/uL (ref 150–400)
RBC: 4.66 MIL/uL (ref 3.87–5.11)
RDW: 13.8 % (ref 11.5–15.5)
WBC: 11.3 10*3/uL — ABNORMAL HIGH (ref 4.0–10.5)
nRBC: 0 % (ref 0.0–0.2)

## 2021-07-17 LAB — LIPASE, BLOOD: Lipase: 27 U/L (ref 11–51)

## 2021-07-17 MED ORDER — ONDANSETRON 4 MG PO TBDP
4.0000 mg | ORAL_TABLET | Freq: Once | ORAL | Status: AC | PRN
  Administered 2021-07-18: 4 mg via ORAL
  Filled 2021-07-17: qty 1

## 2021-07-17 NOTE — ED Triage Notes (Signed)
Pt from home w N/V since 1530 after eating brun[place] stew for lunch. No OTC, prescription meds.  VSS, NAD in triage.

## 2021-07-18 ENCOUNTER — Inpatient Hospital Stay (HOSPITAL_COMMUNITY): Payer: Medicare Other

## 2021-07-18 ENCOUNTER — Encounter (HOSPITAL_COMMUNITY): Payer: Self-pay | Admitting: Internal Medicine

## 2021-07-18 ENCOUNTER — Emergency Department (HOSPITAL_COMMUNITY): Payer: Medicare Other

## 2021-07-18 DIAGNOSIS — I25119 Atherosclerotic heart disease of native coronary artery with unspecified angina pectoris: Secondary | ICD-10-CM | POA: Diagnosis not present

## 2021-07-18 DIAGNOSIS — I25110 Atherosclerotic heart disease of native coronary artery with unstable angina pectoris: Secondary | ICD-10-CM | POA: Diagnosis present

## 2021-07-18 DIAGNOSIS — I160 Hypertensive urgency: Secondary | ICD-10-CM

## 2021-07-18 DIAGNOSIS — E11319 Type 2 diabetes mellitus with unspecified diabetic retinopathy without macular edema: Secondary | ICD-10-CM | POA: Diagnosis present

## 2021-07-18 DIAGNOSIS — Z888 Allergy status to other drugs, medicaments and biological substances status: Secondary | ICD-10-CM | POA: Diagnosis not present

## 2021-07-18 DIAGNOSIS — R7989 Other specified abnormal findings of blood chemistry: Secondary | ICD-10-CM | POA: Diagnosis not present

## 2021-07-18 DIAGNOSIS — Z885 Allergy status to narcotic agent status: Secondary | ICD-10-CM | POA: Diagnosis not present

## 2021-07-18 DIAGNOSIS — R112 Nausea with vomiting, unspecified: Secondary | ICD-10-CM | POA: Diagnosis present

## 2021-07-18 DIAGNOSIS — K6389 Other specified diseases of intestine: Secondary | ICD-10-CM | POA: Diagnosis not present

## 2021-07-18 DIAGNOSIS — Z794 Long term (current) use of insulin: Secondary | ICD-10-CM | POA: Diagnosis not present

## 2021-07-18 DIAGNOSIS — M109 Gout, unspecified: Secondary | ICD-10-CM | POA: Diagnosis present

## 2021-07-18 DIAGNOSIS — Z881 Allergy status to other antibiotic agents status: Secondary | ICD-10-CM | POA: Diagnosis not present

## 2021-07-18 DIAGNOSIS — K432 Incisional hernia without obstruction or gangrene: Secondary | ICD-10-CM | POA: Diagnosis present

## 2021-07-18 DIAGNOSIS — K56609 Unspecified intestinal obstruction, unspecified as to partial versus complete obstruction: Secondary | ICD-10-CM

## 2021-07-18 DIAGNOSIS — R778 Other specified abnormalities of plasma proteins: Secondary | ICD-10-CM

## 2021-07-18 DIAGNOSIS — K56600 Partial intestinal obstruction, unspecified as to cause: Secondary | ICD-10-CM | POA: Diagnosis present

## 2021-07-18 DIAGNOSIS — Z79899 Other long term (current) drug therapy: Secondary | ICD-10-CM | POA: Diagnosis not present

## 2021-07-18 DIAGNOSIS — Z66 Do not resuscitate: Secondary | ICD-10-CM | POA: Diagnosis present

## 2021-07-18 DIAGNOSIS — Z20822 Contact with and (suspected) exposure to covid-19: Secondary | ICD-10-CM | POA: Diagnosis present

## 2021-07-18 DIAGNOSIS — Z7982 Long term (current) use of aspirin: Secondary | ICD-10-CM | POA: Diagnosis not present

## 2021-07-18 DIAGNOSIS — I10 Essential (primary) hypertension: Secondary | ICD-10-CM | POA: Diagnosis present

## 2021-07-18 DIAGNOSIS — Z7952 Long term (current) use of systemic steroids: Secondary | ICD-10-CM | POA: Diagnosis not present

## 2021-07-18 DIAGNOSIS — K5669 Other partial intestinal obstruction: Secondary | ICD-10-CM | POA: Diagnosis not present

## 2021-07-18 DIAGNOSIS — I21A1 Myocardial infarction type 2: Secondary | ICD-10-CM | POA: Diagnosis present

## 2021-07-18 DIAGNOSIS — E669 Obesity, unspecified: Secondary | ICD-10-CM | POA: Diagnosis present

## 2021-07-18 DIAGNOSIS — G894 Chronic pain syndrome: Secondary | ICD-10-CM | POA: Diagnosis present

## 2021-07-18 DIAGNOSIS — E7800 Pure hypercholesterolemia, unspecified: Secondary | ICD-10-CM | POA: Diagnosis present

## 2021-07-18 DIAGNOSIS — R110 Nausea: Secondary | ICD-10-CM | POA: Diagnosis not present

## 2021-07-18 DIAGNOSIS — Z9889 Other specified postprocedural states: Secondary | ICD-10-CM | POA: Diagnosis not present

## 2021-07-18 DIAGNOSIS — K439 Ventral hernia without obstruction or gangrene: Secondary | ICD-10-CM | POA: Diagnosis not present

## 2021-07-18 DIAGNOSIS — E039 Hypothyroidism, unspecified: Secondary | ICD-10-CM | POA: Diagnosis present

## 2021-07-18 DIAGNOSIS — E1121 Type 2 diabetes mellitus with diabetic nephropathy: Secondary | ICD-10-CM | POA: Diagnosis present

## 2021-07-18 DIAGNOSIS — K219 Gastro-esophageal reflux disease without esophagitis: Secondary | ICD-10-CM | POA: Diagnosis present

## 2021-07-18 DIAGNOSIS — Z6834 Body mass index (BMI) 34.0-34.9, adult: Secondary | ICD-10-CM | POA: Diagnosis not present

## 2021-07-18 DIAGNOSIS — I517 Cardiomegaly: Secondary | ICD-10-CM | POA: Diagnosis not present

## 2021-07-18 DIAGNOSIS — K8020 Calculus of gallbladder without cholecystitis without obstruction: Secondary | ICD-10-CM | POA: Diagnosis not present

## 2021-07-18 DIAGNOSIS — Z0389 Encounter for observation for other suspected diseases and conditions ruled out: Secondary | ICD-10-CM | POA: Diagnosis not present

## 2021-07-18 DIAGNOSIS — F0390 Unspecified dementia without behavioral disturbance: Secondary | ICD-10-CM | POA: Diagnosis present

## 2021-07-18 DIAGNOSIS — N281 Cyst of kidney, acquired: Secondary | ICD-10-CM | POA: Diagnosis not present

## 2021-07-18 LAB — URINALYSIS, ROUTINE W REFLEX MICROSCOPIC
Bacteria, UA: NONE SEEN
Bilirubin Urine: NEGATIVE
Glucose, UA: NEGATIVE mg/dL
Hgb urine dipstick: NEGATIVE
Ketones, ur: 20 mg/dL — AB
Leukocytes,Ua: NEGATIVE
Nitrite: NEGATIVE
Protein, ur: 100 mg/dL — AB
Specific Gravity, Urine: 1.015 (ref 1.005–1.030)
pH: 5 (ref 5.0–8.0)

## 2021-07-18 LAB — ECHOCARDIOGRAM COMPLETE
AR max vel: 2.28 cm2
AV Area VTI: 2.59 cm2
AV Area mean vel: 2.13 cm2
AV Mean grad: 4 mmHg
AV Peak grad: 8.2 mmHg
Ao pk vel: 1.43 m/s
Area-P 1/2: 2.75 cm2
S' Lateral: 3.1 cm
Weight: 3033.53 oz

## 2021-07-18 LAB — RESP PANEL BY RT-PCR (FLU A&B, COVID) ARPGX2
Influenza A by PCR: NEGATIVE
Influenza B by PCR: NEGATIVE
SARS Coronavirus 2 by RT PCR: NEGATIVE

## 2021-07-18 LAB — TROPONIN I (HIGH SENSITIVITY)
Troponin I (High Sensitivity): 325 ng/L (ref <18)
Troponin I (High Sensitivity): 278 ng/L (ref <18)

## 2021-07-18 LAB — CBG MONITORING, ED
Glucose-Capillary: 162 mg/dL — ABNORMAL HIGH (ref 70–99)
Glucose-Capillary: 163 mg/dL — ABNORMAL HIGH (ref 70–99)
Glucose-Capillary: 178 mg/dL — ABNORMAL HIGH (ref 70–99)

## 2021-07-18 LAB — GLUCOSE, CAPILLARY: Glucose-Capillary: 135 mg/dL — ABNORMAL HIGH (ref 70–99)

## 2021-07-18 MED ORDER — ONDANSETRON 4 MG PO TBDP
4.0000 mg | ORAL_TABLET | Freq: Four times a day (QID) | ORAL | Status: DC | PRN
Start: 2021-07-18 — End: 2021-07-19

## 2021-07-18 MED ORDER — INSULIN ASPART 100 UNIT/ML IJ SOLN: 0.0000 [IU] | Freq: Every day | INTRAMUSCULAR | Status: DC

## 2021-07-18 MED ORDER — ATORVASTATIN CALCIUM 10 MG PO TABS
20.0000 mg | ORAL_TABLET | Freq: Every day | ORAL | Status: DC
  Administered 2021-07-18 – 2021-07-19 (×2): 20 mg via ORAL
  Filled 2021-07-18 (×2): qty 2

## 2021-07-18 MED ORDER — DIATRIZOATE MEGLUMINE & SODIUM 66-10 % PO SOLN
90.0000 mL | Freq: Once | ORAL | Status: DC
  Filled 2021-07-18: qty 90

## 2021-07-18 MED ORDER — HYDRALAZINE HCL 20 MG/ML IJ SOLN: 10.0000 mg | INTRAMUSCULAR | Status: DC | PRN

## 2021-07-18 MED ORDER — HEPARIN BOLUS VIA INFUSION
3000.0000 [IU] | Freq: Once | INTRAVENOUS | Status: AC
Start: 2021-07-18 — End: 2021-07-18
  Administered 2021-07-18: 3000 [IU] via INTRAVENOUS
  Filled 2021-07-18: qty 3000

## 2021-07-18 MED ORDER — INSULIN NPH (HUMAN) (ISOPHANE) 100 UNIT/ML ~~LOC~~ SUSP
20.0000 [IU] | Freq: Two times a day (BID) | SUBCUTANEOUS | Status: DC
  Filled 2021-07-18: qty 10

## 2021-07-18 MED ORDER — ACETAMINOPHEN 325 MG PO TABS
650.0000 mg | ORAL_TABLET | Freq: Four times a day (QID) | ORAL | Status: DC | PRN
Start: 2021-07-18 — End: 2021-07-19

## 2021-07-18 MED ORDER — ONDANSETRON 4 MG PO TBDP
ORAL_TABLET | ORAL | Status: AC
  Filled 2021-07-18: qty 1

## 2021-07-18 MED ORDER — ISOSORBIDE MONONITRATE ER 30 MG PO TB24
30.0000 mg | ORAL_TABLET | Freq: Every day | ORAL | Status: DC
  Administered 2021-07-18 – 2021-07-19 (×2): 30 mg via ORAL
  Filled 2021-07-18 (×2): qty 1

## 2021-07-18 MED ORDER — CITALOPRAM HYDROBROMIDE 40 MG PO TABS
40.0000 mg | ORAL_TABLET | Freq: Every day | ORAL | Status: DC
  Administered 2021-07-18 – 2021-07-19 (×2): 40 mg via ORAL
  Filled 2021-07-18: qty 1
  Filled 2021-07-18: qty 4

## 2021-07-18 MED ORDER — HYDROCHLOROTHIAZIDE 12.5 MG PO TABS
12.5000 mg | ORAL_TABLET | Freq: Every day | ORAL | Status: DC
  Administered 2021-07-19: 12.5 mg via ORAL
  Filled 2021-07-18: qty 1

## 2021-07-18 MED ORDER — ACETAMINOPHEN 650 MG RE SUPP: 650.0000 mg | Freq: Four times a day (QID) | RECTAL | Status: DC | PRN

## 2021-07-18 MED ORDER — LOSARTAN POTASSIUM-HCTZ 50-12.5 MG PO TABS
1.0000 | ORAL_TABLET | Freq: Every day | ORAL | Status: DC
Start: 2021-07-19 — End: 2021-07-18

## 2021-07-18 MED ORDER — PREDNISONE 5 MG PO TABS
5.0000 mg | ORAL_TABLET | Freq: Every day | ORAL | Status: DC
  Administered 2021-07-18 – 2021-07-19 (×2): 5 mg via ORAL
  Filled 2021-07-18 (×2): qty 1

## 2021-07-18 MED ORDER — LACTATED RINGERS IV SOLN: INTRAVENOUS | Status: DC

## 2021-07-18 MED ORDER — AMLODIPINE BESYLATE 2.5 MG PO TABS
2.5000 mg | ORAL_TABLET | Freq: Every day | ORAL | Status: DC
  Administered 2021-07-18 – 2021-07-19 (×2): 2.5 mg via ORAL
  Filled 2021-07-18 (×2): qty 1

## 2021-07-18 MED ORDER — ONDANSETRON HCL 4 MG/2ML IJ SOLN: 4.0000 mg | Freq: Four times a day (QID) | INTRAMUSCULAR | Status: DC | PRN

## 2021-07-18 MED ORDER — ONDANSETRON HCL 4 MG/2ML IJ SOLN
4.0000 mg | Freq: Once | INTRAMUSCULAR | Status: AC
  Administered 2021-07-18: 4 mg via INTRAVENOUS
  Filled 2021-07-18: qty 2

## 2021-07-18 MED ORDER — INSULIN ASPART 100 UNIT/ML IJ SOLN
0.0000 [IU] | Freq: Three times a day (TID) | INTRAMUSCULAR | Status: DC
  Administered 2021-07-18: 3 [IU] via SUBCUTANEOUS
  Administered 2021-07-19: 5 [IU] via SUBCUTANEOUS
  Administered 2021-07-19: 2 [IU] via SUBCUTANEOUS

## 2021-07-18 MED ORDER — NITROGLYCERIN 2 % TD OINT
1.0000 [in_us] | TOPICAL_OINTMENT | Freq: Once | TRANSDERMAL | Status: AC
  Administered 2021-07-18: 1 [in_us] via TOPICAL
  Filled 2021-07-18: qty 1

## 2021-07-18 MED ORDER — HEPARIN (PORCINE) 25000 UT/250ML-% IV SOLN
1150.0000 [IU]/h | INTRAVENOUS | Status: DC
  Administered 2021-07-18: 1150 [IU]/h via INTRAVENOUS
  Filled 2021-07-18: qty 250

## 2021-07-18 MED ORDER — SODIUM CHLORIDE 0.9 % IV BOLUS
1000.0000 mL | Freq: Once | INTRAVENOUS | Status: AC
  Administered 2021-07-18: 1000 mL via INTRAVENOUS

## 2021-07-18 MED ORDER — LOSARTAN POTASSIUM 50 MG PO TABS
50.0000 mg | ORAL_TABLET | Freq: Every day | ORAL | Status: DC
  Administered 2021-07-19: 50 mg via ORAL
  Filled 2021-07-18: qty 1

## 2021-07-18 MED ORDER — BRIMONIDINE TARTRATE 0.2 % OP SOLN
1.0000 [drp] | Freq: Three times a day (TID) | OPHTHALMIC | Status: DC
  Administered 2021-07-18 – 2021-07-19 (×3): 1 [drp] via OPHTHALMIC
  Filled 2021-07-18 (×3): qty 5

## 2021-07-18 MED ORDER — ASPIRIN EC 81 MG PO TBEC
81.0000 mg | DELAYED_RELEASE_TABLET | Freq: Every day | ORAL | Status: DC
  Administered 2021-07-18 – 2021-07-19 (×2): 81 mg via ORAL
  Filled 2021-07-18 (×2): qty 1

## 2021-07-18 MED ORDER — INSULIN NPH (HUMAN) (ISOPHANE) 100 UNIT/ML ~~LOC~~ SUSP
20.0000 [IU] | Freq: Two times a day (BID) | SUBCUTANEOUS | Status: DC
  Administered 2021-07-19: 20 [IU] via SUBCUTANEOUS
  Filled 2021-07-18 (×2): qty 10

## 2021-07-18 MED ORDER — ASPIRIN 81 MG PO CHEW
324.0000 mg | CHEWABLE_TABLET | Freq: Once | ORAL | Status: AC
  Administered 2021-07-18: 324 mg via ORAL
  Filled 2021-07-18: qty 4

## 2021-07-18 NOTE — Consult Note (Addendum)
Cardiology Consultation:   Patient ID: [name] MRN: [id number]; DOB: [birth year]  Admit date: 07/17/2021 Date of Consult: 07/18/2021  PCP:  Wardell Honour, MD   [facility] Providers Cardiologist:  Sherren Mocha, MD        Patient Profile:   [name] is a 81 y.o. female with a hx of multiple medical problems includingTakotsubo cardiomyopathy and minimal/non-obst CAD on LHC in 2016 and 2018 who is being seen 07/18/2021 for the evaluation of N/V and abnormal troponin at the request of Dr Leonette Monarch [facility]).  History of Present Illness:   [name] has h/o NICM Takotsubo CM back in 2018, with minimal/non-obst CAD on LHC at the time, and full normalization of LVEF to 60-65% on most recent TTE 02-29-20. She presented to the ED today w/ intractable N/V associated lower abdominal pain and fullness. She says she feels like her belly is "bloated" and was very painful on admission.  BP was documented as high as 215/105 in Epic, and 207/135 on bedside monitor; pt is tachycardic (sinus) with HR in low 100s, with PACs. We have been called due to mildly elevated HS trop, 325-->278. Pt denies any chest discomfort, SOB/DOE, palpitations, or any other cardiac complaints. She says that her pain and emesis has improved w/ meds; she was given zofran and NTG paste. Her BP has improved to 703J systolic when I was at the bedside. She has not had any recent anginal sx or complaints. Her N/V/abd pain came on suddenly around 3pm today after eating brunswick stew for lunch.  Past Medical History:  Diagnosis Date   Abdominal pain, unspecified site    Abnormality of gait    Apnea    Cardiomegaly    Chest pain, unspecified    Complication of anesthesia    hard time waking up   Depressive disorder, not elsewhere classified    Diabetes mellitus    Diabetic retinopathy (HCC)    Dizziness and giddiness    Dyskinesia of esophagus    Edema    Extrinsic asthma, unspecified    Female stress  incontinence    Gout, unspecified    High cholesterol    Hypertension    Lipoma of other skin and subcutaneous tissue    Lumbago    Memory loss    Migraine without aura, with intractable migraine, so stated, with status migrainosus    Mild cognitive impairment, so stated    Nonspecific (abnormal) findings on radiological and other examination of abdominal area, including retroperitoneum    Nonspecific abnormal results of liver function study    Obesity, unspecified    Obstructive chronic bronchitis with exacerbation (HCC)    Other and unspecified hyperlipidemia    Other B-complex deficiencies    Other nonspecific abnormal serum enzyme levels    Other specified cardiac dysrhythmias(427.89)    Other specified disease of sebaceous glands    Other symptoms involving cardiovascular system    Pain in joint, ankle and foot    Pain in joint, pelvic region and thigh    Pain in joint, shoulder region    Palpitations    Reflux esophagitis    Shortness of breath    Tension headache    Type I (juvenile type) diabetes mellitus without mention of complication, not stated as uncontrolled    Type I (juvenile type) diabetes mellitus without mention of complication, uncontrolled    Unspecified essential hypertension    Unspecified hypothyroidism    Unspecified vitamin D deficiency  Ventral hernia, unspecified, without mention of obstruction or gangrene     Past Surgical History:  Procedure Laterality Date   ABDOMINAL HYSTERECTOMY     APPENDECTOMY     BREAST SURGERY     EXPLORATORY LAPAROTOMY W/ BOWEL RESECTION  07/25/2004   PROCEDURE:  Laparoscopy, open laparotomy, resection of jejunojejunostomy   INCISIONAL HERNIA REPAIR  03/19/2006   PROCEDURE:  Open ventral hernia repair with mesh.   LAPAROSCOPIC GASTRIC BYPASS  07/22/2004   PROCEDURE:  Laparoscopic Roux-en-Y gastric bypass, antecolic, antegastric,   LAPAROSCOPIC INCISIONAL / UMBILICAL / VENTRAL HERNIA REPAIR  09/22/2005   PROCEDURE:   Laparoscopic ventral hernia repair with mesh.   LEFT HEART CATH AND CORONARY ANGIOGRAPHY N/A 11/20/2016   Procedure: Left Heart Cath and Coronary Angiography;  Surgeon: Sherren Mocha, MD;  Location: [place] [facility];  Service: Cardiovascular;  Laterality: N/A;   LEFT HEART CATHETERIZATION WITH CORONARY ANGIOGRAM N/A 08/21/2014   Procedure: LEFT HEART CATHETERIZATION WITH CORONARY ANGIOGRAM;  Surgeon: Sherren Mocha, MD;  Location: [facility] C[facility];  Service: Cardiovascular;  Laterality: N/A;   stents     last time she said was 1 yr ago      Home Medications:  Prior to Admission medications   Medication Sig Start Date End Date Taking? Authorizing Provider  amLODipine (NORVASC) 2.5 MG tablet Take one tablet by mouth once daily. 06/23/21   Lauree Chandler, NP  aspirin 81 MG tablet Take 81 mg by mouth daily.    [provider]  atorvastatin (LIPITOR) 20 MG tablet TAKE 1 TABLET BY MOUTH EVERY DAY 05/05/21   Sherren Mocha, MD  brimonidine [facility]) 0.2 % ophthalmic solution 1 drop 3 (three) times daily. 05/16/21   [provider]  cetirizine (ZYRTEC) 10 MG tablet Take 10 mg by mouth daily as needed for allergies. For allergies    [provider]  Cholecalciferol (VITAMIN D3) 5000 UNITS CAPS Take 5,000 Units by mouth daily. For supplement    [provider]  citalopram (CELEXA) 40 MG tablet Take 1 tablet (40 mg total) by mouth daily. 06/23/21   Lauree Chandler, NP  Continuous Blood Gluc Receiver (FREESTYLE LIBRE 2 READER) DEVI Use to test blood sugar three times daily.  Dx: E11.9 06/24/21   Wardell Honour, MD  Continuous Blood Gluc Sensor (FREESTYLE LIBRE 2 SENSOR) MISC Use to test blood sugar three times daily. Dx: E11.9 06/24/21   Wardell Honour, MD  docusate sodium (COLACE) 100 MG capsule Take 100 mg by mouth daily.    [provider]  donepezil (ARICEPT) 5 MG tablet Take 1 tablet (5 mg total) by mouth at bedtime. 06/23/21   Lauree Chandler, NP   ferrous sulfate 325 (65 FE) MG tablet Take 325 mg by mouth daily with breakfast.    [provider]  furosemide (LASIX) 20 MG tablet Take 20 mg by mouth 2 (two) times daily as needed for fluid.    [provider]  glipiZIDE (GLUCOTROL XL) 10 MG 24 hr tablet Take 10 mg by mouth daily with breakfast.    [provider]  insulin NPH Human (HUMULIN N) 100 UNIT/ML injection INJECT 40 UNITS IN THE MORNING AND 40 UNITS IN THE EVENING 05/06/21   Wardell Honour, MD  Insulin Syringes, Disposable, U-100 1 ML MISC Use to inject insulin Dx: W73.7106 11/04/18   Reed, Tiffany L, DO  isosorbide mononitrate (IMDUR) 30 MG 24 hr tablet TAKE 1 TABLET BY MOUTH EVERY DAY 05/30/21  Sherren Mocha, MD  latanoprost (XALATAN) 0.005 % ophthalmic solution Place 1 drop into both eyes daily.    [provider]  losartan-hydrochlorothiazide (HYZAAR) 50-12.5 MG tablet TAKE 1 TABLET BY MOUTH EVERY DAY FOR BLOOD PRESSURE 05/05/21   Sherren Mocha, MD  nitroGLYCERIN (NITROSTAT) 0.4 MG SL tablet Place 0.4 mg under the tongue every 5 (five) minutes as needed for chest pain.    [provider]  omeprazole (PRILOSEC) 20 MG capsule Take 1 capsule (20 mg total) by mouth daily. 06/23/21   Lauree Chandler, NP  predniSONE (DELTASONE) 5 MG tablet TAKE 1 TABLET BY MOUTH EVERY DAY IN THE MORNING WITH FOOD TO REDUCE PAIN 05/05/21   Lauree Chandler, NP  triamterene-hydrochlorothiazide (MAXZIDE-25) 37.5-25 MG tablet Take 1 tablet by mouth daily. 02/12/21   Wardell Honour, MD    Inpatient Medications: Scheduled Meds:   Continuous Infusions:  heparin 1,150 Units/hr (07/18/21 0230)   PRN Meds:   Allergies:    Allergies  Allergen Reactions   Ciprofloxacin Rash   Advair Diskus [Fluticasone-Salmeterol] Other (See Comments)    Throat closes   Albuterol     REACTION: closes throat   Alendronate Sodium Itching   Benadryl [Diphenhydramine Hcl] Itching   Benzonatate     REACTION:  rash/hives   Cephalexin Nausea Only   Gabapentin Other (See Comments)    Loss of memory   Iohexol      Desc: rash and DIF BREATHING    Keflex [Cephalexin] Nausea And Vomiting   Morphine Nausea And Vomiting   Other Other (See Comments)    Decongestants- keeps pt awake at night, increased heart rate   Propoxyphene Hcl     Doesn't recall   Reclast [Zoledronic Acid] Other (See Comments)    Chest pain   Avapro [Irbesartan] Rash   Codeine Nausea And Vomiting, Swelling and Rash   Tessalon Perles Rash   Tramadol Nausea Only and Rash    Social History:   Social History   Socioeconomic History   Marital status: Widowed    Spouse name: Not on file   Number of children: Not on file   Years of education: Not on file   Highest education level: Not on file  Occupational History   Not on file  Tobacco Use   Smoking status: Never   Smokeless tobacco: Never  Vaping Use   Vaping Use: Never used  Substance and Sexual Activity   Alcohol use: No   Drug use: No   Sexual activity: Not Currently  Other Topics Concern   Not on file  Social History Narrative   Not on file   Social Determinants of Health   Financial Resource Strain: Not on file  Food Insecurity: Not on file  Transportation Needs: Not on file  Physical Activity: Not on file  Stress: Not on file  Social Connections: Not on file  Intimate Partner Violence: Not on file    Family History:    Family History  Problem Relation Age of Onset   Stroke Mother    Stroke Father    Diabetes Father    Heart disease Father    Diabetes Son    Cancer Brother        BLADDER   Diabetes Brother      ROS:  Please see the history of present illness.   All other ROS reviewed and negative.     Physical Exam/Data:   Vitals:   07/18/21 0145 07/18/21 0200 07/18/21 0230 07/18/21 0300  BP:  (!) 194/103 (!) 178/89 (!) 167/115  Pulse: (!) 101 (!) 107 96 95  Resp: 16 17 (!) 21 (!) 22  Temp:      TempSrc:      SpO2: 97% 93% 93%  92%  Weight:       No intake or output data in the 24 hours ending 07/18/21 0338 Last 3 Weights 07/18/2021 06/17/2021 02/12/2021  Weight (lbs) 189 lb 9.5 oz 189 lb 6.4 oz 194 lb 6.4 oz  Weight (kg) 86 kg 85.911 kg 88.179 kg     Body mass index is 34.68 kg/m.  General:  Well nourished, well developed, in no acute distress HEENT: normal Neck: no JVD Vascular: No carotid bruits Cardiac:  normal S1, S2; RRR, occas ectopy. Tachy.  no murmur  Lungs:  clear to auscultation bilaterally, no wheezing, rhonchi or rales  Abd: mildly distended, soft, nontender to palpation  Ext: tr bilateral edema Musculoskeletal:  No deformities, Skin: warm and dry  Neuro:  no focal abnormalities noted Psych:  Normal affect   EKG:  The EKG was personally reviewed and demonstrates:  NSR with HR 90s, frequent PAC Telemetry:  Telemetry was personally reviewed and demonstrates:  ST with HR low 100s, frequent PACs  Relevant CV Studies: TTE 02-29-20 1. Left ventricular ejection fraction, by estimation, is 60 to 65%. The  left ventricle has normal function. The left ventricle has no regional  wall motion abnormalities. Left ventricular diastolic parameters are  consistent with Grade I diastolic  dysfunction (impaired relaxation).   2. Right ventricular systolic function is normal. The right ventricular  size is mildly enlarged. There is normal pulmonary artery systolic  pressure. The estimated right ventricular systolic pressure is 58.5 mmHg.   3. Left atrial size was mildly dilated.   4. The mitral valve is normal in structure. Trivial mitral valve  regurgitation. No evidence of mitral stenosis.   5. The aortic valve is tricuspid. Aortic valve regurgitation is not  visualized. No aortic stenosis is present.   6. The inferior vena cava is normal in size with greater than 50%  respiratory variability, suggesting right atrial pressure of 3 mmHg.   LHC 11-20-16 Widely patent coronary arteries with mild  nonobstructive CAD Left Anterior Descending  There is mild the vessel.      Ramus Intermedius  The vessel exhibits minimal luminal irregularities.    Left Circumflex  The vessel exhibits minimal luminal irregularities.    Right Coronary Artery  There is mild the vessel.      Laboratory Data:  High Sensitivity Troponin:   Recent Labs  Lab 07/18/21 0038 07/18/21 0226  TROPONINIHS 325* 278*     Chemistry Recent Labs  Lab 07/17/21 2118  NA 138  K 3.6  CL 102  CO2 26  GLUCOSE 213*  BUN 15  CREATININE 0.84  CALCIUM 9.2  GFRNONAA >60  ANIONGAP 10    Recent Labs  Lab 07/17/21 2118  PROT 6.4*  ALBUMIN 3.5  AST 24  ALT 57*  ALKPHOS 93  BILITOT 0.6   Lipids No results for input(s): CHOL, TRIG, HDL, LABVLDL, LDLCALC, CHOLHDL in the last 168 hours.  Hematology Recent Labs  Lab 07/17/21 2118  WBC 11.3*  RBC 4.66  HGB 13.6  HCT 44.2  MCV 94.8  MCH 29.2  MCHC 30.8  RDW 13.8  PLT 304   Thyroid No results for input(s): TSH, FREET4 in the last 168 hours.  BNPNo results for input(s): BNP, PROBNP in the last  168 hours.  DDimer No results for input(s): DDIMER in the last 168 hours.   Radiology/Studies:  [facility] Chest Port 1 View  Result Date: 07/18/2021 CLINICAL DATA:  Emesis, nausea. EXAM: PORTABLE CHEST 1 VIEW COMPARISON:  05/07/2021. FINDINGS: The heart is enlarged and the mediastinal contour is within normal limits. Atherosclerotic calcification of the aorta is noted. The lungs are clear without effusions or infiltrates. No pneumothorax. No acute osseous abnormality. IMPRESSION: Cardiomegaly with no active disease. Electronically Signed   By: Brett Fairy M.D.   On: 07/18/2021 02:07     Assessment and Plan:   HTN urgency with abnormal troponin: mildly elevated HS trop likely due to type II NSTEMI, demand-induced troponin leak due to severely uncontrolled HTN. Trop already trending down w/ improved BP. No ischemic changes on EKG. Doubtful this troponin is due to  plaque-rupture MI. Pt had minimal CAD on LHC in both 2016 and 2018, although disease progression is possible. The patient has not had any anginal sx, but given the possibility of worsening CAD, would consider doing non-invasive ischemia eval w/ nuc stress test vs cath at some point depending on results of further testing in the interim. N/V/abd pain may be due to unrelated issue; recommend further w/ CT abd/pelvis to exclude acute pathology. BP needs optimization in the acute setting and long-term.  H/o Takotsubo/NICM: can repeat TTE to be sure LVEF remains preserved. Multiple non-cardiac medical problems: recommend admission to medicine for further management and optimization. We are happy to follow along as consultants  Risk Assessment/Risk Scores:     TIMI Risk Score for Unstable Angina or Non-ST Elevation MI:   The patient's TIMI risk score is 4, which indicates a 20% risk of all cause mortality, new or recurrent myocardial infarction or need for urgent revascularization in the next 14 days.          For questions or updates, please contact [place] Please consult [email or web address] for contact info under    Signed, Rudean Curt, MD, [facility] 07/18/2021 3:38 AM

## 2021-07-18 NOTE — ED Notes (Signed)
Echo at bedside

## 2021-07-18 NOTE — Assessment & Plan Note (Signed)
-  Normal TSH previously -Not on medications

## 2021-07-18 NOTE — Assessment & Plan Note (Signed)
-  She has 2 sons who live in [place] -She lives alone in [place], in a house located at the end of a dirt [address] about 1 mile long -She acknowledges that this is unsafe and reports a plan to move to ALF in [place]

## 2021-07-18 NOTE — Assessment & Plan Note (Signed)
-  It is not entirely clear why she takes daily prednisone, but it has been continued for now

## 2021-07-18 NOTE — Assessment & Plan Note (Addendum)
-  Large ventral hernia since very remote gastric bypass surgery -2 failed repairs with mesh to follow -No apparent urgency at this time

## 2021-07-18 NOTE — Assessment & Plan Note (Signed)
-  Continue Lipitor °

## 2021-07-18 NOTE — Consult Note (Signed)
[name] 09-27-1940  [id number].    Requesting MD: Dr. Leonette [name] Chief Complaint/Reason for Consult: bowel obstruction  HPI:  [name] is an 81 yo female with a history of CAD, NICM, HTN, and multiple prior abdominal surgeries who presented to the ED with abdominal pain, nausea and vomiting. She says that her pain began around 3pm yesterday and she felt bloated at the time. She has a chronic ventral hernia, and she says this felt more firm when her symptoms began. She then developed nausea and vomiting. She continued to have bowel movements and has been passing flatus. In the ED her troponin was elevated, and she was treated for ACS and cardiology was consulted. Elevated troponin was thought to be secondary to demand ischemia and has been downtrending. CT was recommended to rule out abdominal pathology, and showed mildly dilated loops of small bowel concerning for an early or partial obstruction. General surgery was consulted. The patient says her pain is better and she feels less bloated but she is still having nausea.  She previously had a laparoscopic gastric bypass in 2006, and on POD2 was taken back to the OR for a JJ obstruction, at which time she had a laparotomy with resection of the JJ anastomosis. She later developed a ventral incisional hernia, which was repaired in April 2007 and again in Oct 2007, both times with mesh. She has now had a ventral hernia for several years and has more recently been told she is not a candidate to have this repaired again due to loss of domain and medical comorbidities. She was admitted in August 2022 with a partial small bowel obstruction, which resolved with nonoperative management.  ROS: Review of Systems  Constitutional:  Negative for chills and fever.  HENT:  Negative for sore throat.   Eyes:  Negative for redness.  Respiratory:  Negative for shortness of breath, wheezing and stridor.   Cardiovascular:  Negative for chest pain.  Gastrointestinal:   Positive for abdominal pain, nausea and vomiting. Negative for constipation and diarrhea.  Neurological:  Negative for loss of consciousness and weakness.   Family History  Problem Relation Age of Onset   Stroke Mother    Stroke Father    Diabetes Father    Heart disease Father    Diabetes Son    Cancer Brother        BLADDER   Diabetes Brother     Past Medical History:  Diagnosis Date   Abdominal pain, unspecified site    Abnormality of gait    Apnea    Cardiomegaly    Chest pain, unspecified    Complication of anesthesia    hard time waking up   Depressive disorder, not elsewhere classified    Diabetes mellitus    Diabetic retinopathy ([place])    Dizziness and giddiness    Dyskinesia of esophagus    Edema    Extrinsic asthma, unspecified    Female stress incontinence    Gout, unspecified    High cholesterol    Hypertension    Lipoma of other skin and subcutaneous tissue    Lumbago    Memory loss    Migraine without aura, with intractable migraine, so stated, with status migrainosus    Mild cognitive impairment, so stated    Nonspecific (abnormal) findings on radiological and other examination of abdominal area, including retroperitoneum    Nonspecific abnormal results of liver function study    Obesity, unspecified    Obstructive chronic bronchitis with  exacerbation ([place])    Other and unspecified hyperlipidemia    Other B-complex deficiencies    Other nonspecific abnormal serum enzyme levels    Other specified cardiac dysrhythmias(427.89)    Other specified disease of sebaceous glands    Other symptoms involving cardiovascular system    Pain in joint, ankle and foot    Pain in joint, pelvic region and thigh    Pain in joint, shoulder region    Palpitations    Reflux esophagitis    Shortness of breath    Tension headache    Type I (juvenile type) diabetes mellitus without mention of complication, not stated as uncontrolled    Type I (juvenile type) diabetes  mellitus without mention of complication, uncontrolled    Unspecified essential hypertension    Unspecified hypothyroidism    Unspecified vitamin D deficiency    Ventral hernia, unspecified, without mention of obstruction or gangrene     Past Surgical History:  Procedure Laterality Date   ABDOMINAL HYSTERECTOMY     APPENDECTOMY     BREAST SURGERY     EXPLORATORY LAPAROTOMY W/ BOWEL RESECTION  07/25/2004   PROCEDURE:  Laparoscopy, open laparotomy, resection of jejunojejunostomy   INCISIONAL HERNIA REPAIR  03/19/2006   PROCEDURE:  Open ventral hernia repair with mesh.   LAPAROSCOPIC GASTRIC BYPASS  07/22/2004   PROCEDURE:  Laparoscopic Roux-en-Y gastric bypass, antecolic, antegastric,   LAPAROSCOPIC INCISIONAL / UMBILICAL / VENTRAL HERNIA REPAIR  09/22/2005   PROCEDURE:  Laparoscopic ventral hernia repair with mesh.   LEFT HEART CATH AND CORONARY ANGIOGRAPHY N/A 11/20/2016   Procedure: Left Heart Cath and Coronary Angiography;  Surgeon: Sherren Mocha, MD;  Location: [facility];  Service: Cardiovascular;  Laterality: N/A;   LEFT HEART CATHETERIZATION WITH CORONARY ANGIOGRAM N/A 08/21/2014   Procedure: LEFT HEART CATHETERIZATION WITH CORONARY ANGIOGRAM;  Surgeon: Sherren Mocha, MD;  Location: [facility];  Service: Cardiovascular;  Laterality: N/A;   stents     last time she said was 1 yr ago     Social History:  reports that she has never smoked. She has never used smokeless tobacco. She reports that she does not drink alcohol and does not use drugs.  Allergies:  Allergies  Allergen Reactions   Ciprofloxacin Rash   Advair Diskus [Fluticasone-Salmeterol] Other (See Comments)    Throat closes   Albuterol     REACTION: closes throat   Alendronate Sodium Itching   Benadryl [Diphenhydramine Hcl] Itching   Benzonatate     REACTION: rash/hives   Cephalexin Nausea Only   Gabapentin Other (See Comments)    Loss of memory   Iohexol      Desc: rash and DIF BREATHING    Keflex  [Cephalexin] Nausea And Vomiting   Morphine Nausea And Vomiting   Other Other (See Comments)    Decongestants- keeps pt awake at night, increased heart rate   Propoxyphene Hcl     Doesn't recall   Reclast [Zoledronic Acid] Other (See Comments)    Chest pain   Avapro [Irbesartan] Rash   Codeine Nausea And Vomiting, Swelling and Rash   Tessalon Perles Rash   Tramadol Nausea Only and Rash    (Not in a hospital admission)    Physical Exam: Blood pressure (!) 132/92, pulse 98, temperature 97.7 F (36.5 C), temperature source Axillary, resp. rate 14, weight 86 kg, SpO2 94 %. General: resting comfortably, appears stated age, no apparent distress Neurological: alert and oriented, no focal deficits, cranial nerves grossly in  tact HEENT: normocephalic, atraumatic, oropharynx clear, no scleral icterus CV: regular rate and rhythm, extremities warm and well-perfused Respiratory: normal work of breathing, symmetric chest wall expansion Abdomen: soft, nondistended, well-healed midline laparotomy scar. Large ventral hernia is soft and nontender with no overlying skin changes. Extremities: warm and well-perfused, no deformities, moving all extremities spontaneously Psychiatric: normal mood and affect Skin: warm and dry, no jaundice, no rashes or lesions   Results for orders placed or performed during the hospital encounter of 07/17/21 (from the past 48 hour(s))  Lipase, blood     Status: None   Collection Time: 07/17/21  9:18 PM  Result Value Ref Range   Lipase 27 11 - 51 U/L    Comment: Performed at [facility], [place] [address] [place], [place] [address]  Comprehensive metabolic panel     Status: Abnormal   Collection Time: 07/17/21  9:18 PM  Result Value Ref Range   Sodium 138 135 - 145 mmol/L   Potassium 3.6 3.5 - 5.1 mmol/L   Chloride 102 98 - 111 mmol/L   CO2 26 22 - 32 mmol/L   Glucose, Bld 213 (H) 70 - 99 mg/dL    Comment: Glucose reference range applies only to samples  taken after fasting for at least 8 hours.   BUN 15 8 - 23 mg/dL   Creatinine, Ser 0.84 0.44 - 1.00 mg/dL   Calcium 9.2 8.9 - 10.3 mg/dL   Total Protein 6.4 (L) 6.5 - 8.1 g/dL   Albumin 3.5 3.5 - 5.0 g/dL   AST 24 15 - 41 U/L   ALT 57 (H) 0 - 44 U/L   Alkaline Phosphatase 93 38 - 126 U/L   Total Bilirubin 0.6 0.3 - 1.2 mg/dL   GFR, Estimated >60 >60 mL/min    Comment: (NOTE) Calculated using the CKD-EPI Creatinine Equation (2021)    Anion gap 10 5 - 15    Comment: Performed at [facility], [place] [address], [place], [place] [address]  CBC     Status: Abnormal   Collection Time: 07/17/21  9:18 PM  Result Value Ref Range   WBC 11.3 (H) 4.0 - 10.5 K/uL   RBC 4.66 3.87 - 5.11 MIL/uL   Hemoglobin 13.6 12.0 - 15.0 g/dL   HCT 44.2 36.0 - 46.0 %   MCV 94.8 80.0 - 100.0 fL   MCH 29.2 26.0 - 34.0 pg   MCHC 30.8 30.0 - 36.0 g/dL   RDW 13.8 11.5 - 15.5 %   Platelets 304 150 - 400 K/uL   nRBC 0.0 0.0 - 0.2 %    Comment: Performed at [facility], [place] [address], [place], [place] [address]  Urinalysis, Routine w reflex microscopic Urine, Clean Catch     Status: Abnormal   Collection Time: 07/17/21 11:41 PM  Result Value Ref Range   Color, Urine YELLOW YELLOW   APPearance CLEAR CLEAR   Specific Gravity, Urine 1.015 1.005 - 1.030   pH 5.0 5.0 - 8.0   Glucose, UA NEGATIVE NEGATIVE mg/dL   Hgb urine dipstick NEGATIVE NEGATIVE   Bilirubin Urine NEGATIVE NEGATIVE   Ketones, ur 20 (A) NEGATIVE mg/dL   Protein, ur 100 (A) NEGATIVE mg/dL   Nitrite NEGATIVE NEGATIVE   Leukocytes,Ua NEGATIVE NEGATIVE   RBC / HPF 0-5 0 - 5 RBC/hpf   WBC, UA 0-5 0 - 5 WBC/hpf   Bacteria, UA NONE SEEN NONE SEEN   Squamous Epithelial / LPF 0-5 0 -  5   Mucus PRESENT     Comment: Performed at [facility], [place] [address] [place], [place] [address]  Troponin I (High Sensitivity)     Status: Abnormal   Collection Time: 07/18/21 12:38 AM  Result Value Ref Range   Troponin I (High Sensitivity) 325  (HH) <18 ng/L    Comment: CRITICAL RESULT CALLED TO, READ BACK BY AND VERIFIED WITH: Jimmey Ralph RN 07/18/21 0142 M KOROLESKI (NOTE) Elevated high sensitivity troponin I (hsTnI) values and significant  changes across serial measurements may suggest ACS but many other  chronic and acute conditions are known to elevate hsTnI results.  Refer to the Links section for chest pain algorithms and additional  guidance. Performed at [facility], [place] [address], [place], [place] [address]   Resp Panel by RT-PCR (Flu A&B, Covid) Nasopharyngeal Swab     Status: None   Collection Time: 07/18/21  1:59 AM   Specimen: Nasopharyngeal Swab; Nasopharyngeal(NP) swabs in vial transport medium  Result Value Ref Range   SARS Coronavirus 2 by RT PCR NEGATIVE NEGATIVE    Comment: (NOTE) SARS-CoV-2 target nucleic acids are NOT DETECTED.  The SARS-CoV-2 RNA is generally detectable in upper respiratory specimens during the acute phase of infection. The lowest concentration of SARS-CoV-2 viral copies this assay can detect is 138 copies/mL. A negative result does not preclude SARS-Cov-2 infection and should not be used as the sole basis for treatment or other patient management decisions. A negative result may occur with  improper specimen collection/handling, submission of specimen other than nasopharyngeal swab, presence of viral mutation(s) within the areas targeted by this assay, and inadequate number of viral copies(<138 copies/mL). A negative result must be combined with clinical observations, patient history, and epidemiological information. The expected result is Negative.  Fact Sheet for Patients:  EntrepreneurPulse.com.au  Fact Sheet for Healthcare Providers:  IncredibleEmployment.be  This test is no t yet approved or cleared by the [place] FDA and  has been authorized for detection and/or diagnosis of SARS-CoV-2 by FDA under an Emergency Use  Authorization (EUA). This EUA will remain  in effect (meaning this test can be used) for the duration of the COVID-19 declaration under Section 564(b)(1) of the Act, 21 U.S.C.section 360bbb-3(b)(1), unless the authorization is terminated  or revoked sooner.       Influenza A by PCR NEGATIVE NEGATIVE   Influenza B by PCR NEGATIVE NEGATIVE    Comment: (NOTE) The Xpert Xpress SARS-CoV-2/FLU/RSV plus assay is intended as an aid in the diagnosis of influenza from Nasopharyngeal swab specimens and should not be used as a sole basis for treatment. Nasal washings and aspirates are unacceptable for Xpert Xpress SARS-CoV-2/FLU/RSV testing.  Fact Sheet for Patients: EntrepreneurPulse.com.au  Fact Sheet for Healthcare Providers: IncredibleEmployment.be  This test is not yet approved or cleared by the [place] FDA and has been authorized for detection and/or diagnosis of SARS-CoV-2 by FDA under an Emergency Use Authorization (EUA). This EUA will remain in effect (meaning this test can be used) for the duration of the COVID-19 declaration under Section 564(b)(1) of the Act, 21 U.S.C. section 360bbb-3(b)(1), unless the authorization is terminated or revoked.  Performed at [facility], [place] [address] [place], [place] [address]   Troponin I (High Sensitivity)     Status: Abnormal   Collection Time: 07/18/21  2:26 AM  Result Value Ref Range   Troponin I (High Sensitivity) 278 (HH) <18 ng/L    Comment: CRITICAL VALUE  NOTED.  VALUE IS CONSISTENT WITH PREVIOUSLY REPORTED AND CALLED VALUE. (NOTE) Elevated high sensitivity troponin I (hsTnI) values and significant  changes across serial measurements may suggest ACS but many other  chronic and acute conditions are known to elevate hsTnI results.  Refer to the Links section for chest pain algorithms and additional  guidance. Performed at [facility], [place] [address] [place],  [place] [address]    CT ABDOMEN PELVIS WO CONTRAST  Addendum Date: 07/18/2021   ADDENDUM REPORT: 07/18/2021 04:31 ADDENDUM: Critical findings were reported to Dr. Leonette [name] at 4:31 a.m. Electronically Signed   By: Brett Fairy M.D.   On: 07/18/2021 04:31   Result Date: 07/18/2021 CLINICAL DATA:  Nausea/vomiting. EXAM: CT ABDOMEN AND PELVIS WITHOUT CONTRAST TECHNIQUE: Multidetector CT imaging of the abdomen and pelvis was performed following the standard protocol without IV contrast. RADIATION DOSE REDUCTION: This exam was performed according to the departmental dose-optimization program which includes automated exposure control, adjustment of the mA and/or kV according to patient size and/or use of iterative reconstruction technique. COMPARISON:  01/07/2021. FINDINGS: Lower chest: No acute abnormality. Hepatobiliary: No focal liver abnormality is seen. Stones are present within the gallbladder. No biliary ductal dilatation. Pancreas: Unremarkable. No pancreatic ductal dilatation or surrounding inflammatory changes. Spleen: Normal in size without focal abnormality. Adrenals/Urinary Tract: The adrenal glands are within normal limits. Cysts are noted in the kidneys bilaterally. No ureteral calculus or obstructive uropathy bilaterally. The bladder is unremarkable. Stomach/Bowel: Gastric surgery changes are noted. There is clumping of the small bowel in the mid abdomen with dilated loops of small bowel measuring up to 4.2 cm. Multiple anastomotic sites are present. The distal small bowel is normal in caliber, however a definitive transition point is not identified. There is surrounding fat stranding and edema in the mesentery in this region. No free air or pneumatosis is seen. The appendix is not visualized on exam. The colon is nondistended. Vascular/Lymphatic: Aortic atherosclerosis. No enlarged abdominal or pelvic lymph nodes. Reproductive: Status post hysterectomy. No adnexal masses. Other: No abdominopelvic ascites.  There is a stable density with calcification in the gastrocolic ligament which is unchanged from prior exams. There is diastasis of the rectus abdominus with a large broad-based hernia containing bowel. Musculoskeletal: Degenerative changes are present in the thoracolumbar spine. There is a stable compression deformity at T12. IMPRESSION: 1. Clumping of small bowel in the mid abdomen with multiple dilated loops of small bowel measuring up to 4.2 cm in diameter. Mild mesenteric edema and anastomotic sites are noted in this region. However a definitive transition point is not identified. Findings are concerning for possible early or partial small bowel obstruction. Surgical consultation is recommended. 2. Bilateral renal cysts. 3. Cholelithiasis. 4. Diastasis of the rectus abdominus with a large broad-based ventral abdominal wall hernia. 5. Aortic atherosclerosis. Electronically Signed: By: Brett Fairy M.D. On: 07/18/2021 04:25   DG Chest Port 1 View  Result Date: 07/18/2021 CLINICAL DATA:  Emesis, nausea. EXAM: PORTABLE CHEST 1 VIEW COMPARISON:  05/07/2021. FINDINGS: The heart is enlarged and the mediastinal contour is within normal limits. Atherosclerotic calcification of the aorta is noted. The lungs are clear without effusions or infiltrates. No pneumothorax. No acute osseous abnormality. IMPRESSION: Cardiomegaly with no active disease. Electronically Signed   By: Brett Fairy M.D.   On: 07/18/2021 02:07      Assessment/Plan This is an 81 yo female with a history of multiple medical comorbidities and multiple prior abdominal surgeries, presenting to the ED with  abdominal pain, nausea and vomiting. She was hypertensive on arrival with elevated troponin and is being treated for a type II NSTEMI per cardiology. I reviewed her abdominal CT scan. She has a large ventral hernia containing small bowel, and there are multiple small bowel anastomoses with mild dilation of the surrounding small bowel in the  mid-abdomen. The roux limb and PB limb do not appear dilated, and there is no mesenteric swirl to suggest an internal hernia. On review of her prior scans in August of last year, the small bowel appears similar today, and there is fecalization of the small bowel, suggesting a chronic component. The hernia itself does not appear to be a source of obstruction and given the wide mouth of the hernia, it is unlikely to cause strangulation. The patient's symptoms may be caused by a partial adhesive obstruction. As the roux limb is not dilated, will defer NG decompression for now. - NPO, gentle hydration, treat symptomatically for nausea/vomiting - Once nausea is controlled can consider the SBO protocol and give contrast orally (patient has a contrast allergy listed but on chart review has previously taken oral contrast) - No indication for acute surgical intervention at this time. General surgery will follow.  Level of medical decision-making: Moderate  Michaelle Birks, [facility], Hepatobiliary and Pancreatic Surgery 07/18/21 6:23 AM

## 2021-07-18 NOTE — Progress Notes (Signed)
Was made aware that patient has had oral contrast before and tolerated this well with no issues despite her reaction to IV contrast.  Will proceed with SBO protocol.  Henreitta Cea 3:08 PM 07/18/2021

## 2021-07-18 NOTE — Assessment & Plan Note (Signed)
-  Continue Celexa -Will add delirium precautions

## 2021-07-18 NOTE — Assessment & Plan Note (Signed)
-  Body mass index is 34.68 kg/m..  -She is s/p remote gastric bypass -Weight loss should be encouraged -Outpatient PCP/bariatric medicine f/u encouraged

## 2021-07-18 NOTE — Assessment & Plan Note (Addendum)
-  Upon presentation, the patient was found to have a significantly elevated BP with troponin elevation -Troponin has improved -Cardiology was consulted -She is recommended for possible non-invasive ischemic evaluation -Repeat echo was also recommended and ordered by cardiology

## 2021-07-18 NOTE — Progress Notes (Signed)
Progress Note  Patient Name: [name] Date of Encounter: 07/18/2021  [facility] Cardiologist: Sherren Mocha, MD   Subjective   Per patient, lower abdominal pain started yesterday after lunch with severe N/V, resolved yesterday afternoon without further recurrence. No recent chest pain. Occasional feel her heart "turned" for a second (sounds like PACs)  Inpatient Medications    Scheduled Meds:  amLODipine  2.5 mg Oral Daily   aspirin EC  81 mg Oral Daily   atorvastatin  20 mg Oral Daily   brimonidine  1 drop Both Eyes TID   citalopram  40 mg Oral Daily   [START ON 07/19/2021] losartan  50 mg Oral Daily   And   [START ON 07/19/2021] hydrochlorothiazide  12.5 mg Oral Daily   insulin aspart  0-15 Units Subcutaneous TID WC   insulin aspart  0-5 Units Subcutaneous QHS   insulin NPH Human  20 Units Subcutaneous BID AC   isosorbide mononitrate  30 mg Oral Daily   predniSONE  5 mg Oral Q breakfast   Continuous Infusions:  lactated ringers 75 mL/hr at 07/18/21 1042   PRN Meds: acetaminophen **OR** acetaminophen, hydrALAZINE, ondansetron **OR** ondansetron (ZOFRAN) IV   Vital Signs    Vitals:   07/18/21 0815 07/18/21 0845 07/18/21 1130 07/18/21 1345  BP: (!) 171/146 127/62 133/63 131/64  Pulse: (!) 51 76 75 63  Resp: 20 17 19 18   Temp:      TempSrc:      SpO2: 100% 92% 90% 91%  Weight:       No intake or output data in the 24 hours ending 07/18/21 1500 Last 3 Weights 07/18/2021 06/17/2021 02/12/2021  Weight (lbs) 189 lb 9.5 oz 189 lb 6.4 oz 194 lb 6.4 oz  Weight (kg) 86 kg 85.911 kg 88.179 kg      Telemetry    NSR without significant ventricular ectopy, occasional PACs - Personally Reviewed  ECG    NSR with PACs - Personally Reviewed  Physical Exam   GEN: No acute distress.   Neck: No JVD Cardiac: RRR, no murmurs, rubs, or gallops.  Respiratory: Clear to auscultation bilaterally. GI: Soft, nontender, non-distended  MS: No edema; No deformity. Neuro:   Nonfocal  Psych: Normal affect   Labs    High Sensitivity Troponin:   Recent Labs  Lab 07/18/21 0038 07/18/21 0226  TROPONINIHS 325* 278*     Chemistry Recent Labs  Lab 07/17/21 2118  NA 138  K 3.6  CL 102  CO2 26  GLUCOSE 213*  BUN 15  CREATININE 0.84  CALCIUM 9.2  PROT 6.4*  ALBUMIN 3.5  AST 24  ALT 57*  ALKPHOS 93  BILITOT 0.6  GFRNONAA >60  ANIONGAP 10    Lipids No results for input(s): CHOL, TRIG, HDL, LABVLDL, LDLCALC, CHOLHDL in the last 168 hours.  Hematology Recent Labs  Lab 07/17/21 2118  WBC 11.3*  RBC 4.66  HGB 13.6  HCT 44.2  MCV 94.8  MCH 29.2  MCHC 30.8  RDW 13.8  PLT 304   Thyroid No results for input(s): TSH, FREET4 in the last 168 hours.  BNPNo results for input(s): BNP, PROBNP in the last 168 hours.  DDimer No results for input(s): DDIMER in the last 168 hours.   Radiology    CT ABDOMEN PELVIS WO CONTRAST  Addendum Date: 07/18/2021   ADDENDUM REPORT: 07/18/2021 04:31 ADDENDUM: Critical findings were reported to Dr. Leonette Monarch at 4:31 a.m. Electronically Signed   By: Brett Fairy  M.D.   On: 07/18/2021 04:31   Result Date: 07/18/2021 CLINICAL DATA:  Nausea/vomiting. EXAM: CT ABDOMEN AND PELVIS WITHOUT CONTRAST TECHNIQUE: Multidetector CT imaging of the abdomen and pelvis was performed following the standard protocol without IV contrast. RADIATION DOSE REDUCTION: This exam was performed according to the departmental dose-optimization program which includes automated exposure control, adjustment of the mA and/or kV according to patient size and/or use of iterative reconstruction technique. COMPARISON:  01/07/2021. FINDINGS: Lower chest: No acute abnormality. Hepatobiliary: No focal liver abnormality is seen. Stones are present within the gallbladder. No biliary ductal dilatation. Pancreas: Unremarkable. No pancreatic ductal dilatation or surrounding inflammatory changes. Spleen: Normal in size without focal abnormality. Adrenals/Urinary Tract:  The adrenal glands are within normal limits. Cysts are noted in the kidneys bilaterally. No ureteral calculus or obstructive uropathy bilaterally. The bladder is unremarkable. Stomach/Bowel: Gastric surgery changes are noted. There is clumping of the small bowel in the mid abdomen with dilated loops of small bowel measuring up to 4.2 cm. Multiple anastomotic sites are present. The distal small bowel is normal in caliber, however a definitive transition point is not identified. There is surrounding fat stranding and edema in the mesentery in this region. No free air or pneumatosis is seen. The appendix is not visualized on exam. The colon is nondistended. Vascular/Lymphatic: Aortic atherosclerosis. No enlarged abdominal or pelvic lymph nodes. Reproductive: Status post hysterectomy. No adnexal masses. Other: No abdominopelvic ascites. There is a stable density with calcification in the gastrocolic ligament which is unchanged from prior exams. There is diastasis of the rectus abdominus with a large broad-based hernia containing bowel. Musculoskeletal: Degenerative changes are present in the thoracolumbar spine. There is a stable compression deformity at T12. IMPRESSION: 1. Clumping of small bowel in the mid abdomen with multiple dilated loops of small bowel measuring up to 4.2 cm in diameter. Mild mesenteric edema and anastomotic sites are noted in this region. However a definitive transition point is not identified. Findings are concerning for possible early or partial small bowel obstruction. Surgical consultation is recommended. 2. Bilateral renal cysts. 3. Cholelithiasis. 4. Diastasis of the rectus abdominus with a large broad-based ventral abdominal wall hernia. 5. Aortic atherosclerosis. Electronically Signed: By: Brett Fairy M.D. On: 07/18/2021 04:25   DG Chest Port 1 View  Result Date: 07/18/2021 CLINICAL DATA:  Emesis, nausea. EXAM: PORTABLE CHEST 1 VIEW COMPARISON:  05/07/2021. FINDINGS: The heart is  enlarged and the mediastinal contour is within normal limits. Atherosclerotic calcification of the aorta is noted. The lungs are clear without effusions or infiltrates. No pneumothorax. No acute osseous abnormality. IMPRESSION: Cardiomegaly with no active disease. Electronically Signed   By: Brett Fairy M.D.   On: 07/18/2021 02:07   ECHOCARDIOGRAM COMPLETE  Result Date: 07/18/2021    ECHOCARDIOGRAM REPORT   Patient Name:   [name] Date of Exam: 07/18/2021 Medical Rec #:  [id number]     Height:       62.0 in Accession #:    [id number]    Weight:       189.6 lb Date of Birth:  [birth year]      BSA:          1.869 m Patient Age:    81 years      BP:           127/62 mmHg Patient Gender: F             HR:           72  bpm. Exam Location:  Inpatient Procedure: 2D Echo, Cardiac Doppler and Color Doppler Indications:     Elevated troponin  History:         Patient has prior history of Echocardiogram examinations, most                  recent 02/29/2020. CAD; Risk Factors:Hypertension, Diabetes and                  Sleep Apnea. Hx Takatsubo cardiomyopathy.  Sonographer:     Clayton Lefort RDCS (AE) Referring Phys:  [id number] [facility] MARTIN Diagnosing Phys: Kirk Ruths McleanMD IMPRESSIONS  1. Left ventricular ejection fraction, by estimation, is 55 to 60%. The left ventricle has normal function. The left ventricle has no regional wall motion abnormalities. There is mild left ventricular hypertrophy. Left ventricular diastolic parameters are consistent with Grade II diastolic dysfunction (pseudonormalization).  2. Right ventricular systolic function is normal. The right ventricular size is mildly enlarged. Tricuspid regurgitation signal is inadequate for assessing PA pressure.  3. Right atrial size was mildly dilated.  4. The mitral valve is normal in structure. No evidence of mitral valve regurgitation. No evidence of mitral stenosis.  5. The aortic valve is tricuspid. Aortic valve regurgitation is not visualized. Aortic  valve sclerosis/calcification is present, without any evidence of aortic stenosis.  6. The inferior vena cava is dilated in size with <50% respiratory variability, suggesting right atrial pressure of 15 mmHg. FINDINGS  Left Ventricle: Left ventricular ejection fraction, by estimation, is 55 to 60%. The left ventricle has normal function. The left ventricle has no regional wall motion abnormalities. The left ventricular internal cavity size was normal in size. There is  mild left ventricular hypertrophy. Left ventricular diastolic parameters are consistent with Grade II diastolic dysfunction (pseudonormalization). Right Ventricle: The right ventricular size is mildly enlarged. No increase in right ventricular wall thickness. Right ventricular systolic function is normal. Tricuspid regurgitation signal is inadequate for assessing PA pressure. Left Atrium: Left atrial size was normal in size. Right Atrium: Right atrial size was mildly dilated. Pericardium: Trivial pericardial effusion is present. Mitral Valve: The mitral valve is normal in structure. There is mild calcification of the mitral valve leaflet(s). No evidence of mitral valve regurgitation. No evidence of mitral valve stenosis. Tricuspid Valve: The tricuspid valve is normal in structure. Tricuspid valve regurgitation is not demonstrated. Aortic Valve: The aortic valve is tricuspid. Aortic valve regurgitation is not visualized. Aortic valve sclerosis/calcification is present, without any evidence of aortic stenosis. Aortic valve mean gradient measures 4.0 mmHg. Aortic valve peak gradient measures 8.2 mmHg. Aortic valve area, by VTI measures 2.59 cm. Pulmonic Valve: The pulmonic valve was normal in structure. Pulmonic valve regurgitation is not visualized. Aorta: The aortic root is normal in size and structure. Venous: The inferior vena cava is dilated in size with less than 50% respiratory variability, suggesting right atrial pressure of 15 mmHg. IAS/Shunts:  No atrial level shunt detected by color flow Doppler.  LEFT VENTRICLE PLAX 2D LVIDd:         4.20 cm   Diastology LVIDs:         3.10 cm   LV e' medial:    6.30 cm/s LV PW:         1.70 cm   LV E/e' medial:  15.5 LV IVS:        1.60 cm   LV e' lateral:   8.18 cm/s LVOT diam:     2.00 cm   LV  E/e' lateral: 12.0 LV SV:         61 LV SV Index:   32 LVOT Area:     3.14 cm  RIGHT VENTRICLE             IVC RV Basal diam:  3.40 cm     IVC diam: 2.30 cm RV S prime:     15.20 cm/s TAPSE (M-mode): 1.9 cm LEFT ATRIUM             Index        RIGHT ATRIUM           Index LA diam:        4.00 cm 2.14 cm/m   RA Area:     21.10 cm LA Vol (A2C):   45.8 ml 24.51 ml/m  RA Volume:   66.60 ml  35.64 ml/m LA Vol (A4C):   43.0 ml 23.01 ml/m LA Biplane Vol: 46.0 ml 24.62 ml/m  AORTIC VALVE AV Area (Vmax):    2.28 cm AV Area (Vmean):   2.13 cm AV Area (VTI):     2.59 cm AV Vmax:           143.00 cm/s AV Vmean:          94.900 cm/s AV VTI:            0.234 m AV Peak Grad:      8.2 mmHg AV Mean Grad:      4.0 mmHg LVOT Vmax:         104.00 cm/s LVOT Vmean:        64.200 cm/s LVOT VTI:          0.193 m LVOT/AV VTI ratio: 0.82  AORTA Ao Root diam: 3.00 cm Ao Asc diam:  3.30 cm MITRAL VALVE MV Area (PHT): 2.75 cm    SHUNTS MV Decel Time: 276 msec    Systemic VTI:  0.19 m MV E velocity: 97.90 cm/s  Systemic Diam: 2.00 cm MV A velocity: 91.40 cm/s MV E/A ratio:  1.07 Dalton McleanMD Electronically signed by Franki Monte Signature Date/Time: 07/18/2021/2:48:09 PM    Final (Updated)     Cardiac Studies   Echo 07/18/2021 1. Left ventricular ejection fraction, by estimation, is 55 to 60%. The  left ventricle has normal function. The left ventricle has no regional  wall motion abnormalities. There is mild left ventricular hypertrophy.  Left ventricular diastolic parameters  are consistent with Grade II diastolic dysfunction (pseudonormalization).   2. Right ventricular systolic function is normal. The right ventricular  size is  mildly enlarged. Tricuspid regurgitation signal is inadequate for  assessing PA pressure.   3. Right atrial size was mildly dilated.   4. The mitral valve is normal in structure. No evidence of mitral valve  regurgitation. No evidence of mitral stenosis.   5. The aortic valve is tricuspid. Aortic valve regurgitation is not  visualized. Aortic valve sclerosis/calcification is present, without any  evidence of aortic stenosis.   6. The inferior vena cava is dilated in size with <50% respiratory  variability, suggesting right atrial pressure of 15 mmHg.   Patient Profile     81 y.o. female with PMH of Takotsubo cardiomyopathy, minimal nonobstructive CAD on LHC 2016 and 2018, HTN, HLD, type 1 DM, and reflux esophagitis presented with intractable N/V with associated lower abdominal pain and fullness. BP on arrival 215/105. Trop 325 --> 278. Cardiology called regarding elevated troponin. Subsequent CT showed small bowel obstruction.  Assessment & Plan  Demand ischemia  - previous cath in 2016 and 2018 showed minimal CAD  - elevated troponin likely related to hypertensive urgency and small bowel obstruction.   - Echo 07/18/2020 EF 55-60%, grade 2 DD, mildly enlarged RV with normal RVEF, no significant valve issue.   - without recent chest pain and with normal echo, will discuss with MD, may not need further ischemic work up in this case.   Small bowel obstruction: CT reviewed by general surgery who felt symptom may be caused by a partial adhesive obstruction. Plan for conservative management. No indication for acute surgical intervention at this time.   Hypertensive urgency: patient was unable to keep any of her BP meds down yesterday due to N/V, now her BP is normal on home meds  H/o takotsubo cardiomyopathy: normal EF on echo today  L knee swelling: been going on for years, low suspicion for DVT      For questions or updates, please contact [place] Please consult [email or web address]  for contact info under        Signed, Almyra Deforest, [place]  07/18/2021, 3:00 PM

## 2021-07-18 NOTE — ED Notes (Signed)
Pt ambulated to BR with assistance X 1.

## 2021-07-18 NOTE — Assessment & Plan Note (Addendum)
-  She has urgency upon arrival in the ER -This has improved and is normalized now -Home amlodipine has been resumed -Will resume ARB-HCTZ tomorrow

## 2021-07-18 NOTE — Assessment & Plan Note (Addendum)
-  Patient with prior h/o abdominal surgeries presenting with acute onset of abdominal pain with n/v, abdominal distention, and CT findings c/w partial SBO -She was similarly admitted for this problem in 12/2020 -Will admit to on Med Surg -Gen Surg consulted; currently no indication for surgical intervention -80% of SBO will resolve without surgery; her symptoms appear to be significantly improved at this time -High-grade SBO can usually be safely managed non-operatively -If PO contrast reaches colon within 24 hours, SBO will very likely certainly resolve without surgery -NPO for bowel rest -NG tube not needed currently -IVF hydration -Current guidelines recommend that patients without resolution undergo surgery by 3-5 days

## 2021-07-18 NOTE — Progress Notes (Signed)
°  Echocardiogram 2D Echocardiogram has been performed.  [name] 07/18/2021, 10:11 AM

## 2021-07-18 NOTE — H&P (Signed)
History and Physical    Patient: [name] XBJ:[id number] DOB: 11/12/1940 DOA: 07/17/2021 DOS: the patient was seen and examined on 07/18/2021 PCP: Wardell Honour, MD  Patient coming from: Home - lives alone; NOK: Sloan Leiter, [phone]   Chief Complaint: N/V  HPI: [name] is a 81 y.o. female with medical history significant of DM; depression; HTN; HLD;  dementia; and hypothyroidism presenting with n/v.  She developed severe abdominal pain and refractory n/v.  She got very weak.  Symptoms started yesterday at 3pm.  Pain was in her lower abdomen.  She threw up TNTC times, last emesis was this AM about 645.  She does feel better and is not having pain.  She no longer feels nauseated.  Last BM was yesterday and was normal.    ER Course:  Carryover, per Dr. Marlowe Sax:  Received a call from cardiology requesting [place] to admit this patient she presented to the ED complaining of intractable nausea and vomiting, abdominal pain and bloating.  Blood pressure elevated with systolic in the 308M, tachycardic.  Initial troponin was 325 and the ED felt that her symptoms were due to ACS and placed her on IV heparin and requested cardiology to admit.  Cardiologist Dr. Hassell Done told me that patient had Takotsubo's several years ago and subsequent echo showing normal EF.  Repeat troponin came back at 278.  She feels that the patient's symptoms are due to an acute GI issue rather than ACS.  Feels that troponin elevation is likely due to demand ischemia in the setting of hypertensive urgency.  ED has not treated the patient's high blood pressure and has not done any imaging of her abdomen.  Cardiologist informed me that she will speak to the ED and tell them that [facility] will admit the patient but blood pressure needs to be controlled and CT of abdomen needs to be obtained.     Update: Blood pressure now improved after nitroglycerin ointment.  CT showing early versus partial SBO.  Patient is not actively  vomiting and had a bowel movement prior to ED arrival.  Surgery has been consulted.     Review of Systems: As mentioned in the history of present illness. All other systems reviewed and are negative. Past Medical History:  Diagnosis Date   Abnormality of gait    Cardiomegaly    Complication of anesthesia    hard time waking up   Depressive disorder, not elsewhere classified    Diabetic retinopathy ([place])    Extrinsic asthma, unspecified    Female stress incontinence    Gout, unspecified    High cholesterol    Lumbago    Memory loss    Migraine without aura, with intractable migraine, so stated, with status migrainosus    Obesity, unspecified    Palpitations    Reflux esophagitis    Type I (juvenile type) diabetes mellitus without mention of complication, uncontrolled    Unspecified essential hypertension    Unspecified hypothyroidism    Unspecified vitamin D deficiency    Ventral hernia, unspecified, without mention of obstruction or gangrene    Past Surgical History:  Procedure Laterality Date   ABDOMINAL HYSTERECTOMY     APPENDECTOMY     BREAST SURGERY     EXPLORATORY LAPAROTOMY W/ BOWEL RESECTION  07/25/2004   PROCEDURE:  Laparoscopy, open laparotomy, resection of jejunojejunostomy   INCISIONAL HERNIA REPAIR  03/19/2006   PROCEDURE:  Open ventral hernia repair with mesh.   LAPAROSCOPIC GASTRIC BYPASS  07/22/2004   PROCEDURE:  Laparoscopic Roux-en-Y gastric bypass, antecolic, antegastric,   LAPAROSCOPIC INCISIONAL / UMBILICAL / VENTRAL HERNIA REPAIR  09/22/2005   PROCEDURE:  Laparoscopic ventral hernia repair with mesh.   LEFT HEART CATH AND CORONARY ANGIOGRAPHY N/A 11/20/2016   Procedure: Left Heart Cath and Coronary Angiography;  Surgeon: Sherren Mocha, MD;  Location: [place] [facility];  Service: Cardiovascular;  Laterality: N/A;   LEFT HEART CATHETERIZATION WITH CORONARY ANGIOGRAM N/A 08/21/2014   Procedure: LEFT HEART CATHETERIZATION WITH CORONARY ANGIOGRAM;  Surgeon:  Sherren Mocha, MD;  Location: [facility] C[facility];  Service: Cardiovascular;  Laterality: N/A;   stents     last time she said was 1 yr ago    Social History:  reports that she has never smoked. She has never used smokeless tobacco. She reports that she does not drink alcohol and does not use drugs.  Allergies  Allergen Reactions   Ciprofloxacin Rash   Advair Diskus [Fluticasone-Salmeterol] Other (See Comments)    Throat closes   Albuterol     REACTION: closes throat   Alendronate Sodium Itching   Benadryl [Diphenhydramine Hcl] Itching   Benzonatate     REACTION: rash/hives   Cephalexin Nausea Only   Gabapentin Other (See Comments)    Loss of memory   Iohexol      Desc: rash and DIF BREATHING    Keflex [Cephalexin] Nausea And Vomiting   Morphine Nausea And Vomiting   Other Other (See Comments)    Decongestants- keeps pt awake at night, increased heart rate   Propoxyphene Hcl     Doesn't recall   Reclast [Zoledronic Acid] Other (See Comments)    Chest pain   Avapro [Irbesartan] Rash   Codeine Nausea And Vomiting, Swelling and Rash   Tessalon Perles Rash   Tramadol Nausea Only and Rash    Family History  Problem Relation Age of Onset   Stroke Mother    Stroke Father    Diabetes Father    Heart disease Father    Diabetes Son    Cancer Brother        BLADDER   Diabetes Brother     Prior to Admission medications   Medication Sig Start Date End Date Taking? Authorizing Provider  amLODipine (NORVASC) 2.5 MG tablet Take one tablet by mouth once daily. Patient taking differently: Take 2.5 mg by mouth daily. 06/23/21  Yes Lauree Chandler, NP  atorvastatin (LIPITOR) 20 MG tablet TAKE 1 TABLET BY MOUTH EVERY DAY Patient taking differently: Take 20 mg by mouth daily. 05/05/21  Yes Sherren Mocha, MD  brimonidine [facility]) 0.2 % ophthalmic solution Place 1 drop into both eyes 3 (three) times daily. 05/16/21  Yes [provider]  cetirizine (ZYRTEC) 10 MG tablet Take 10  mg by mouth daily as needed for allergies. For allergies   Yes [provider]  Cholecalciferol (VITAMIN D3) 5000 UNITS CAPS Take 5,000 Units by mouth daily. For supplement   Yes [provider]  citalopram (CELEXA) 40 MG tablet Take 1 tablet (40 mg total) by mouth daily. 06/23/21  Yes Lauree Chandler, NP  Continuous Blood Gluc Receiver (FREESTYLE LIBRE 2 READER) DEVI Use to test blood sugar three times daily.  Dx: E11.9 06/24/21  Yes Wardell Honour, MD  Continuous Blood Gluc Sensor (FREESTYLE LIBRE 2 SENSOR) MISC Use to test blood sugar three times daily. Dx: E11.9 06/24/21  Yes Wardell Honour, MD  Cyanocobalamin (VITAMIN B-12 PO) Take 1 tablet by mouth daily.  Yes [provider]  docusate sodium (COLACE) 100 MG capsule Take 100 mg by mouth daily.   Yes [provider]  ferrous sulfate 325 (65 FE) MG tablet Take 325 mg by mouth daily with breakfast.   Yes [provider]  furosemide (LASIX) 20 MG tablet Take 20 mg by mouth 2 (two) times daily as needed for fluid.   Yes [provider]  glipiZIDE (GLUCOTROL XL) 10 MG 24 hr tablet Take 10 mg by mouth daily with breakfast.   Yes [provider]  HUMULIN R 100 UNIT/ML injection Inject 5 Units into the skin as needed for high blood sugar. 07/07/21  Yes [provider]  insulin NPH Human (HUMULIN N) 100 UNIT/ML injection INJECT 40 UNITS IN THE MORNING AND 40 UNITS IN THE EVENING Patient taking differently: Inject 40 Units into the skin 2 (two) times daily before a meal. 05/06/21  Yes Wardell Honour, MD  Insulin Syringes, Disposable, U-100 1 ML MISC Use to inject insulin Dx: Z61.0960 11/04/18  Yes Reed, Tiffany L, DO  isosorbide mononitrate (IMDUR) 30 MG 24 hr tablet TAKE 1 TABLET BY MOUTH EVERY DAY Patient taking differently: Take 30 mg by mouth daily. 05/30/21  Yes Sherren Mocha, MD  losartan-hydrochlorothiazide (HYZAAR) 50-12.5 MG tablet TAKE 1 TABLET BY MOUTH EVERY DAY FOR  BLOOD PRESSURE Patient taking differently: Take 1 tablet by mouth daily. 05/05/21  Yes Sherren Mocha, MD  nitroGLYCERIN (NITROSTAT) 0.4 MG SL tablet Place 0.4 mg under the tongue every 5 (five) minutes as needed for chest pain.   Yes [provider]  omeprazole (PRILOSEC) 20 MG capsule Take 1 capsule (20 mg total) by mouth daily. 06/23/21  Yes Lauree Chandler, NP  predniSONE (DELTASONE) 5 MG tablet TAKE 1 TABLET BY MOUTH EVERY DAY IN THE MORNING WITH FOOD TO REDUCE PAIN Patient taking differently: Take 5 mg by mouth daily with breakfast. 05/05/21  Yes Lauree Chandler, NP  aspirin 81 MG tablet Take 81 mg by mouth daily.    [provider]  donepezil (ARICEPT) 5 MG tablet Take 1 tablet (5 mg total) by mouth at bedtime. Patient not taking: Reported on 07/18/2021 06/23/21   Lauree Chandler, NP  latanoprost (XALATAN) 0.005 % ophthalmic solution Place 1 drop into both eyes daily. Patient not taking: Reported on 07/18/2021    [provider]  triamterene-hydrochlorothiazide (MAXZIDE-25) 37.5-25 MG tablet Take 1 tablet by mouth daily. Patient not taking: Reported on 07/18/2021 02/12/21   Wardell Honour, MD    Physical Exam: Vitals:   07/18/21 0815 07/18/21 0845 07/18/21 1130 07/18/21 1345  BP: (!) 171/146 127/62 133/63 131/64  Pulse: (!) 51 76 75 63  Resp: 20 17 19 18   Temp:      TempSrc:      SpO2: 100% 92% 90% 91%  Weight:       General:  Appears calm and comfortable and is in NAD, conversant Eyes:  PERRL, EOMI, normal lids, iris ENT:  grossly normal hearing, lips & tongue, mmm; some absent dentition Neck:  no LAD, masses or thyromegaly Cardiovascular:  RRR, no m/r/g. No LE edema.  Respiratory:   CTA bilaterally with no wheezes/rales/rhonchi.  Normal respiratory effort. Abdomen:  soft, NT, ND; large ventral hernia Skin:  no rash or induration seen on limited exam Musculoskeletal:  grossly normal tone BUE/BLE, good ROM, no bony abnormality Psychiatric:   grossly normal mood and affect, speech fluent and appropriate, AOx3 Neurologic:  CN 2-12 grossly intact, moves  all extremities in coordinated fashion   Radiological Exams on Admission: Independently reviewed - see discussion in A/P where applicable  CT ABDOMEN PELVIS WO CONTRAST  Addendum Date: 07/18/2021   ADDENDUM REPORT: 07/18/2021 04:31 ADDENDUM: Critical findings were reported to Dr. Leonette Monarch at 4:31 a.m. Electronically Signed   By: Brett Fairy M.D.   On: 07/18/2021 04:31   Result Date: 07/18/2021 CLINICAL DATA:  Nausea/vomiting. EXAM: CT ABDOMEN AND PELVIS WITHOUT CONTRAST TECHNIQUE: Multidetector CT imaging of the abdomen and pelvis was performed following the standard protocol without IV contrast. RADIATION DOSE REDUCTION: This exam was performed according to the departmental dose-optimization program which includes automated exposure control, adjustment of the mA and/or kV according to patient size and/or use of iterative reconstruction technique. COMPARISON:  01/07/2021. FINDINGS: Lower chest: No acute abnormality. Hepatobiliary: No focal liver abnormality is seen. Stones are present within the gallbladder. No biliary ductal dilatation. Pancreas: Unremarkable. No pancreatic ductal dilatation or surrounding inflammatory changes. Spleen: Normal in size without focal abnormality. Adrenals/Urinary Tract: The adrenal glands are within normal limits. Cysts are noted in the kidneys bilaterally. No ureteral calculus or obstructive uropathy bilaterally. The bladder is unremarkable. Stomach/Bowel: Gastric surgery changes are noted. There is clumping of the small bowel in the mid abdomen with dilated loops of small bowel measuring up to 4.2 cm. Multiple anastomotic sites are present. The distal small bowel is normal in caliber, however a definitive transition point is not identified. There is surrounding fat stranding and edema in the mesentery in this region. No free air or pneumatosis is seen. The  appendix is not visualized on exam. The colon is nondistended. Vascular/Lymphatic: Aortic atherosclerosis. No enlarged abdominal or pelvic lymph nodes. Reproductive: Status post hysterectomy. No adnexal masses. Other: No abdominopelvic ascites. There is a stable density with calcification in the gastrocolic ligament which is unchanged from prior exams. There is diastasis of the rectus abdominus with a large broad-based hernia containing bowel. Musculoskeletal: Degenerative changes are present in the thoracolumbar spine. There is a stable compression deformity at T12. IMPRESSION: 1. Clumping of small bowel in the mid abdomen with multiple dilated loops of small bowel measuring up to 4.2 cm in diameter. Mild mesenteric edema and anastomotic sites are noted in this region. However a definitive transition point is not identified. Findings are concerning for possible early or partial small bowel obstruction. Surgical consultation is recommended. 2. Bilateral renal cysts. 3. Cholelithiasis. 4. Diastasis of the rectus abdominus with a large broad-based ventral abdominal wall hernia. 5. Aortic atherosclerosis. Electronically Signed: By: Brett Fairy M.D. On: 07/18/2021 04:25   DG Chest Port 1 View  Result Date: 07/18/2021 CLINICAL DATA:  Emesis, nausea. EXAM: PORTABLE CHEST 1 VIEW COMPARISON:  05/07/2021. FINDINGS: The heart is enlarged and the mediastinal contour is within normal limits. Atherosclerotic calcification of the aorta is noted. The lungs are clear without effusions or infiltrates. No pneumothorax. No acute osseous abnormality. IMPRESSION: Cardiomegaly with no active disease. Electronically Signed   By: Brett Fairy M.D.   On: 07/18/2021 02:07    EKG: Independently reviewed.  NSR with rate 95; nonspecific ST changes with no evidence of acute ischemia   Labs on Admission: I have personally reviewed the available labs and imaging studies at the time of the admission.  Pertinent labs:    Glucose  213 HS troponin 325, 278 WBC 11.3 COVID/flu negative UA: 20 ketones, 100 protein    Assessment and Plan: * SBO (small bowel obstruction) ([place])- (present on admission) -Patient with prior h/o  abdominal surgeries presenting with acute onset of abdominal pain with n/v, abdominal distention, and CT findings c/w partial SBO -She was similarly admitted for this problem in 12/2020 -Will admit to on Med Surg -Gen Surg consulted; currently no indication for surgical intervention -80% of SBO will resolve without surgery; her symptoms appear to be significantly improved at this time -High-grade SBO can usually be safely managed non-operatively -If PO contrast reaches colon within 24 hours, SBO will very likely certainly resolve without surgery -NPO for bowel rest -NG tube not needed currently -IVF hydration -Current guidelines recommend that patients without resolution undergo surgery by 3-5 days  DNR (do not resuscitate)- (present on admission) -I have discussed code status with the patient and she would not desire resuscitation and would prefer to die a natural death should that situation arise. -She will need a gold out of facility DNR form at the time of discharge  Hypertensive urgency- (present on admission) -Upon presentation, the patient was found to have a significantly elevated BP with troponin elevation -Troponin has improved -Cardiology was consulted -She is recommended for possible non-invasive ischemic evaluation -Repeat echo was also recommended and ordered by cardiology  Current chronic use of systemic steroids -It is not entirely clear why she takes daily prednisone, but it has been continued for now  High risk social situation -She has 2 sons who live in [place] -She lives alone in [place], in a house located at the end of a dirt road about 1 mile long -She acknowledges that this is unsafe and reports a plan to move to ALF in [place]  Obesity (BMI 30-39.9)- (present on  admission) -Body mass index is 34.68 kg/m..  -She is s/p remote gastric bypass -Weight loss should be encouraged -Outpatient PCP/bariatric medicine f/u encouraged  Recurrent ventral incisional hernia- (present on admission) -Large ventral hernia since very remote gastric bypass surgery -2 failed repairs with mesh to follow -No apparent urgency at this time  Essential hypertension, benign- (present on admission) -She has urgency upon arrival in the ER -This has improved and is normalized now -Home amlodipine has been resumed -Will resume ARB-HCTZ tomorrow  HLD (hyperlipidemia)- (present on admission) -Continue Lipitor  Controlled diabetes mellitus with nephropathy ([place])- (present on admission) -Last A1c was 6.9 -Continue NPH at half home dose (NPO) -Hold home glucotrol -Will cover with moderate-scale SSI  Hypothyroidism- (present on admission) -Normal TSH previously -Not on medications  MCI (mild cognitive impairment)- (present on admission) -Continue Celexa -Will add delirium precautions    Advance Care Planning:   Code Status: DNR   Consults: Surgery; cardiology  DVT Prophylaxis: SCDs  Family Communication: None present; she did not want me to contact her family at the time of admission  Severity of Illness: The appropriate patient status for this patient is INPATIENT. Inpatient status is judged to be reasonable and necessary in order to provide the required intensity of service to ensure the patient's safety. The patient's presenting symptoms, physical exam findings, and initial radiographic and laboratory data in the context of their chronic comorbidities is felt to place them at high risk for further clinical deterioration. Furthermore, it is not anticipated that the patient will be medically stable for discharge from the hospital within 2 midnights of admission.   * I certify that at the point of admission it is my clinical judgment that the patient will require  inpatient hospital care spanning beyond 2 midnights from the point of admission due to high intensity of service, high risk for further  deterioration and high frequency of surveillance required.*  Author: Karmen Bongo, MD 07/18/2021 2:20 PM  For on call review [email or web address].

## 2021-07-18 NOTE — Assessment & Plan Note (Signed)
-  Last A1c was 6.9 -Continue NPH at half home dose (NPO) -Hold home glucotrol -Will cover with moderate-scale SSI

## 2021-07-18 NOTE — ED Provider Notes (Addendum)
[facility] EMERGENCY DEPARTMENT Provider Note   CSN: [id number] Arrival date & time: 07/17/21  2055     History  Chief Complaint  Patient presents with   Nausea    [name] is a 81 y.o. female. The patient presents to the emergency department complaining of nausea and vomiting that began earlier Thursday afternoon. The patient states that she was eating brunswick stew around 12pm and began to feel nauseated. She went home and began vomiting around 3pm. She complains of mild generalized abdominal pain. The patient has PMH significant for esophageal reflux, DM with insulin use, HTN, chronic pain syndrome, recurrent ventral incisional hernia, Takotsubo cardiomyopathy, hx of SBO. Hx of multiple abdominal surgeries including gastric bypass, appendectomy, hysterectomy.  HPI  HPI: A 81 year old patient with a history of treated diabetes, hypertension, hypercholesterolemia and obesity presents for evaluation of chest pain. Initial onset of pain was approximately 1-3 hours ago. The patient's chest pain is not worse with exertion. The patient complains of nausea. The patient's chest pain is not middle- or left-sided, is not well-localized, is not described as heaviness/pressure/tightness, is not sharp and does not radiate to the arms/jaw/neck. The patient denies diaphoresis. The patient has no history of stroke, has no history of peripheral artery disease, has not smoked in the past 90 days and has no relevant family history of coronary artery disease (first degree relative at less than age 57).   Home Medications Prior to Admission medications   Medication Sig Start Date End Date Taking? Authorizing Provider  amLODipine (NORVASC) 2.5 MG tablet Take one tablet by mouth once daily. Patient taking differently: Take 2.5 mg by mouth daily. 06/23/21  Yes Lauree Chandler, NP  atorvastatin (LIPITOR) 20 MG tablet TAKE 1 TABLET BY MOUTH EVERY DAY Patient taking differently: Take 20  mg by mouth daily. 05/05/21  Yes Sherren Mocha, MD  brimonidine [facility]) 0.2 % ophthalmic solution Place 1 drop into both eyes 3 (three) times daily. 05/16/21  Yes [provider]  cetirizine (ZYRTEC) 10 MG tablet Take 10 mg by mouth daily as needed for allergies. For allergies   Yes [provider]  Cholecalciferol (VITAMIN D3) 5000 UNITS CAPS Take 5,000 Units by mouth daily. For supplement   Yes [provider]  citalopram (CELEXA) 40 MG tablet Take 1 tablet (40 mg total) by mouth daily. 06/23/21  Yes Lauree Chandler, NP  Continuous Blood Gluc Receiver (FREESTYLE LIBRE 2 READER) DEVI Use to test blood sugar three times daily.  Dx: E11.9 06/24/21  Yes Wardell Honour, MD  Continuous Blood Gluc Sensor (FREESTYLE LIBRE 2 SENSOR) MISC Use to test blood sugar three times daily. Dx: E11.9 06/24/21  Yes Wardell Honour, MD  Cyanocobalamin (VITAMIN B-12 PO) Take 1 tablet by mouth daily.   Yes [provider]  docusate sodium (COLACE) 100 MG capsule Take 100 mg by mouth daily.   Yes [provider]  ferrous sulfate 325 (65 FE) MG tablet Take 325 mg by mouth daily with breakfast.   Yes [provider]  furosemide (LASIX) 20 MG tablet Take 20 mg by mouth 2 (two) times daily as needed for fluid.   Yes [provider]  glipiZIDE (GLUCOTROL XL) 10 MG 24 hr tablet Take 10 mg by mouth daily with breakfast.   Yes [provider]  HUMULIN R 100 UNIT/ML injection Inject 5 Units into the skin as needed for high blood sugar. 07/07/21  Yes [provider]  insulin NPH  Human (HUMULIN N) 100 UNIT/ML injection INJECT 40 UNITS IN THE MORNING AND 40 UNITS IN THE EVENING Patient taking differently: Inject 40 Units into the skin 2 (two) times daily before a meal. 05/06/21  Yes Wardell Honour, MD  Insulin Syringes, Disposable, U-100 1 ML MISC Use to inject insulin Dx: F02.7741 11/04/18  Yes Reed, Tiffany L, DO  isosorbide mononitrate (IMDUR)  30 MG 24 hr tablet TAKE 1 TABLET BY MOUTH EVERY DAY Patient taking differently: Take 30 mg by mouth daily. 05/30/21  Yes Sherren Mocha, MD  losartan-hydrochlorothiazide (HYZAAR) 50-12.5 MG tablet TAKE 1 TABLET BY MOUTH EVERY DAY FOR BLOOD PRESSURE Patient taking differently: Take 1 tablet by mouth daily. 05/05/21  Yes Sherren Mocha, MD  nitroGLYCERIN (NITROSTAT) 0.4 MG SL tablet Place 0.4 mg under the tongue every 5 (five) minutes as needed for chest pain.   Yes [provider]  omeprazole (PRILOSEC) 20 MG capsule Take 1 capsule (20 mg total) by mouth daily. 06/23/21  Yes Lauree Chandler, NP  predniSONE (DELTASONE) 5 MG tablet TAKE 1 TABLET BY MOUTH EVERY DAY IN THE MORNING WITH FOOD TO REDUCE PAIN Patient taking differently: Take 5 mg by mouth daily with breakfast. 05/05/21  Yes Lauree Chandler, NP  aspirin 81 MG tablet Take 81 mg by mouth daily.    [provider]  donepezil (ARICEPT) 5 MG tablet Take 1 tablet (5 mg total) by mouth at bedtime. Patient not taking: Reported on 07/18/2021 06/23/21   Lauree Chandler, NP  latanoprost (XALATAN) 0.005 % ophthalmic solution Place 1 drop into both eyes daily. Patient not taking: Reported on 07/18/2021    [provider]  triamterene-hydrochlorothiazide (MAXZIDE-25) 37.5-25 MG tablet Take 1 tablet by mouth daily. Patient not taking: Reported on 07/18/2021 02/12/21   Wardell Honour, MD      Allergies    Ciprofloxacin, Advair diskus [fluticasone-salmeterol], Albuterol, Alendronate sodium, Benadryl [diphenhydramine hcl], Benzonatate, Cephalexin, Gabapentin, Iohexol, Keflex [cephalexin], Morphine, Other, Propoxyphene hcl, Reclast [zoledronic acid], Avapro [irbesartan], Codeine, Tessalon perles, and Tramadol    Review of Systems   Review of Systems  Constitutional:  Negative for fever.  Respiratory:  Negative for shortness of breath.   Cardiovascular:  Negative for chest pain and leg swelling.  Gastrointestinal:   Positive for abdominal pain, nausea and vomiting.  Genitourinary:  Negative for dysuria.   Physical Exam Updated Vital Signs BP (!) 132/92    Pulse 98    Temp 97.7 F (36.5 C) (Axillary)    Resp 14    Wt 86 kg    SpO2 94%    BMI 34.68 kg/m  Physical Exam Constitutional:      General: She is not in acute distress. HENT:     Head: Normocephalic.  Eyes:     Conjunctiva/sclera: Conjunctivae normal.  Cardiovascular:     Rate and Rhythm: Normal rate and regular rhythm.  Pulmonary:     Effort: Pulmonary effort is normal.     Breath sounds: Normal breath sounds.  Abdominal:     Palpations: Abdomen is soft.     Tenderness: There is abdominal tenderness (Mild diffuse abdominal tenderness). There is no right CVA tenderness or left CVA tenderness.  Musculoskeletal:     Cervical back: Normal range of motion.  Skin:    General: Skin is warm and dry.  Neurological:     Mental Status: She is alert.    ED Results / Procedures / Treatments   Labs (all labs ordered are listed, but  only abnormal results are displayed) Labs Reviewed  COMPREHENSIVE METABOLIC PANEL - Abnormal; Notable for the following components:      Result Value   Glucose, Bld 213 (*)    Total Protein 6.4 (*)    ALT 57 (*)    All other components within normal limits  CBC - Abnormal; Notable for the following components:   WBC 11.3 (*)    All other components within normal limits  URINALYSIS, ROUTINE W REFLEX MICROSCOPIC - Abnormal; Notable for the following components:   Ketones, ur 20 (*)    Protein, ur 100 (*)    All other components within normal limits  TROPONIN I (HIGH SENSITIVITY) - Abnormal; Notable for the following components:   Troponin I (High Sensitivity) 325 (*)    All other components within normal limits  TROPONIN I (HIGH SENSITIVITY) - Abnormal; Notable for the following components:   Troponin I (High Sensitivity) 278 (*)    All other components within normal limits  RESP PANEL BY RT-PCR (FLU A&B,  COVID) ARPGX2  LIPASE, BLOOD  HEPARIN LEVEL (UNFRACTIONATED)    EKG EKG Interpretation  Date/Time:  Friday July 18 2021 00:30:18 EST Ventricular Rate:  95 PR Interval:  180 QRS Duration: 86 QT Interval:  366 QTC Calculation: 461 R Axis:   72 Text Interpretation: Sinus rhythm Supraventricular bigeminy Low voltage, precordial leads Borderline T abnormalities, lateral leads Confirmed by Addison Lank [phone]) on 07/18/2021 1:45:09 AM  Radiology CT ABDOMEN PELVIS WO CONTRAST  Addendum Date: 07/18/2021   ADDENDUM REPORT: 07/18/2021 04:31 ADDENDUM: Critical findings were reported to Dr. Leonette Monarch at 4:31 a.m. Electronically Signed   By: Brett Fairy M.D.   On: 07/18/2021 04:31   Result Date: 07/18/2021 CLINICAL DATA:  Nausea/vomiting. EXAM: CT ABDOMEN AND PELVIS WITHOUT CONTRAST TECHNIQUE: Multidetector CT imaging of the abdomen and pelvis was performed following the standard protocol without IV contrast. RADIATION DOSE REDUCTION: This exam was performed according to the departmental dose-optimization program which includes automated exposure control, adjustment of the mA and/or kV according to patient size and/or use of iterative reconstruction technique. COMPARISON:  01/07/2021. FINDINGS: Lower chest: No acute abnormality. Hepatobiliary: No focal liver abnormality is seen. Stones are present within the gallbladder. No biliary ductal dilatation. Pancreas: Unremarkable. No pancreatic ductal dilatation or surrounding inflammatory changes. Spleen: Normal in size without focal abnormality. Adrenals/Urinary Tract: The adrenal glands are within normal limits. Cysts are noted in the kidneys bilaterally. No ureteral calculus or obstructive uropathy bilaterally. The bladder is unremarkable. Stomach/Bowel: Gastric surgery changes are noted. There is clumping of the small bowel in the mid abdomen with dilated loops of small bowel measuring up to 4.2 cm. Multiple anastomotic sites are present. The distal  small bowel is normal in caliber, however a definitive transition point is not identified. There is surrounding fat stranding and edema in the mesentery in this region. No free air or pneumatosis is seen. The appendix is not visualized on exam. The colon is nondistended. Vascular/Lymphatic: Aortic atherosclerosis. No enlarged abdominal or pelvic lymph nodes. Reproductive: Status post hysterectomy. No adnexal masses. Other: No abdominopelvic ascites. There is a stable density with calcification in the gastrocolic ligament which is unchanged from prior exams. There is diastasis of the rectus abdominus with a large broad-based hernia containing bowel. Musculoskeletal: Degenerative changes are present in the thoracolumbar spine. There is a stable compression deformity at T12. IMPRESSION: 1. Clumping of small bowel in the mid abdomen with multiple dilated loops of small bowel measuring up to 4.2 cm  in diameter. Mild mesenteric edema and anastomotic sites are noted in this region. However a definitive transition point is not identified. Findings are concerning for possible early or partial small bowel obstruction. Surgical consultation is recommended. 2. Bilateral renal cysts. 3. Cholelithiasis. 4. Diastasis of the rectus abdominus with a large broad-based ventral abdominal wall hernia. 5. Aortic atherosclerosis. Electronically Signed: By: Brett Fairy M.D. On: 07/18/2021 04:25   DG Chest Port 1 View  Result Date: 07/18/2021 CLINICAL DATA:  Emesis, nausea. EXAM: PORTABLE CHEST 1 VIEW COMPARISON:  05/07/2021. FINDINGS: The heart is enlarged and the mediastinal contour is within normal limits. Atherosclerotic calcification of the aorta is noted. The lungs are clear without effusions or infiltrates. No pneumothorax. No acute osseous abnormality. IMPRESSION: Cardiomegaly with no active disease. Electronically Signed   By: Brett Fairy M.D.   On: 07/18/2021 02:07    Procedures .Critical Care Performed by: Dorothyann Peng, PA Authorized by: Dorothyann Peng, PA   Critical care provider statement:    Critical care time (minutes):  75   Critical care time was exclusive of:  Separately billable procedures and treating other patients   Critical care was necessary to treat or prevent imminent or life-threatening deterioration of the following conditions:  Cardiac failure   Critical care was time spent personally by me on the following activities:  Development of treatment plan with patient or surrogate, discussions with consultants, evaluation of patient's response to treatment, examination of patient, ordering and review of laboratory studies, ordering and review of radiographic studies, ordering and performing treatments and interventions, pulse oximetry, re-evaluation of patient's condition and review of old charts   Care discussed with: admitting provider   .1-3 Lead EKG Interpretation Performed by: Dorothyann Peng, PA Authorized by: Dorothyann Peng, PA     Interpretation: abnormal     ECG rate:  101   ECG rate assessment: tachycardic     Rhythm: sinus tachycardia     Ectopy: none     Conduction: normal      Medications Ordered in ED Medications  ondansetron (ZOFRAN-ODT) disintegrating tablet 4 mg (4 mg Oral Given 07/18/21 0105)  sodium chloride 0.9 % bolus 1,000 mL (0 mLs Intravenous Stopped 07/18/21 0244)  aspirin chewable tablet 324 mg (324 mg Oral Given 07/18/21 0220)  nitroGLYCERIN (NITROGLYN) 2 % ointment 1 inch (1 inch Topical Given 07/18/21 0222)  heparin bolus via infusion 3,000 Units (3,000 Units Intravenous Bolus from Bag 07/18/21 0231)  ondansetron (ZOFRAN) injection 4 mg (4 mg Intravenous Given 07/18/21 0245)    ED Course/ Medical Decision Making/ A&P   HEAR Score: 4                       Medical Decision Making Amount and/or Complexity of Data Reviewed Labs: ordered. Radiology: ordered.  Risk OTC drugs. Prescription drug management. Decision regarding  hospitalization.   This patient presents to the ED for concern of nausea and vomiting, this involves an extensive number of treatment options, and is a complaint that carries with it a high risk of complications and morbidity.  The differential diagnosis includes but is not limited to viral infection, foodborne illness, ACS, pancreatitis, cholecystitis, and others   Co morbidities that complicate the patient evaluation  Ventral hernia, HTN, previous MI, Takotsubo cardiomyopathy   Additional history obtained:   External records from outside source obtained and reviewed including ED note from May 07, 2021   Lab Tests:  I Ordered, and personally  interpreted labs.  The pertinent results include:  Troponin 325, second troponin trending down at 278. WBC 11.3   Imaging Studies ordered:  CT abdomen. Images showed a possible partial small bowel obstruction. I agree with the radiologist's interpretation   Cardiac Monitoring:  The patient was maintained on a cardiac monitor.  I personally viewed and interpreted the cardiac monitored which showed an underlying rhythm of: Normal sinus   Medicines ordered and prescription drug management:  I ordered medication including Zofran for nausea, heparin for anticoagulation, nitroglycerin paste, aspirin for antiplatelet Reevaluation of the patient after these medicines showed that the patient's nausea improved I have reviewed the patients home medicines and have made adjustments as needed   Test Considered:  CT abdomen    Consultations Obtained:  I requested consultation with the cardiologist, Dr. Cristobal Goldmann  and discussed lab and imaging findings as well as pertinent plan - She feels like the elevated troponin is likely hypertensive emergency vs NSTEMI. The patient's initial blood pressures were elevated as high as 215/105. The cuff was repositioned appropriately to the upper arm and repeat pressures were 178/89, 167/115. 156/94. Cardiology  also requested an abdominal CT to evaluate for abdominal causes. The patient's abdomen is nontender and she had a bowel movement earlier today. they recommend discussing admission with the medicine team and agree to see the patient.  The hospitalist Dr.Rathore agreed to admit the patient    Reevaluation:  After the interventions noted above, I reevaluated the patient and found that they have :stayed the same    Dispostion:  After consideration of the diagnostic results and the patients response to treatment, I feel that the patent would benefit from hospital admission. Their troponin is 325. This patient is vomiting with elevated cardiac enzymes and would not be a candidate for discharge home. The troponin makes causes such as food borne illness, cholecystitis, or pancreatitis highly unlikely. Patient admitted to hospital   Final Clinical Impression(s) / ED Diagnoses Final diagnoses:  Emesis  NSTEMI (non-ST elevated myocardial infarction) [facility])    Rx / [place] Orders ED Discharge Orders     None         Dorothyann Peng, [place] 07/18/21 0631    Dorothyann Peng, PA 07/18/21 [place], [place], PA 07/18/21 [id number]    Fatima Blank, MD 07/18/21 [phone]

## 2021-07-18 NOTE — Progress Notes (Signed)
ANTICOAGULATION CONSULT NOTE - Follow Up Consult  Pharmacy Consult for Heparin Indication: chest pain/ACS  Allergies  Allergen Reactions   Ciprofloxacin Rash   Advair Diskus [Fluticasone-Salmeterol] Other (See Comments)    Throat closes   Albuterol     REACTION: closes throat   Alendronate Sodium Itching   Benadryl [Diphenhydramine Hcl] Itching   Benzonatate     REACTION: rash/hives   Cephalexin Nausea Only   Gabapentin Other (See Comments)    Loss of memory   Iohexol      Desc: rash and DIF BREATHING    Keflex [Cephalexin] Nausea And Vomiting   Morphine Nausea And Vomiting   Other Other (See Comments)    Decongestants- keeps pt awake at night, increased heart rate   Propoxyphene Hcl     Doesn't recall   Reclast [Zoledronic Acid] Other (See Comments)    Chest pain   Avapro [Irbesartan] Rash   Codeine Nausea And Vomiting, Swelling and Rash   Tessalon Perles Rash   Tramadol Nausea Only and Rash    Patient Measurements: Weight: 86 kg (189 lb 9.5 oz)  Vital Signs: Temp: 97.7 F (36.5 C) (02/16 2108) Temp Source: Axillary (02/16 2108) BP: 204/121 (02/17 0115) Pulse Rate: 123 (02/17 0115)  Labs: Recent Labs    07/17/21 2118 07/18/21 0038  HGB 13.6  --   HCT 44.2  --   PLT 304  --   CREATININE 0.84  --   TROPONINIHS  --  325*    Estimated Creatinine Clearance: 51.7 mL/min (by C-G formula based on SCr of 0.84 mg/dL).  Assessment: 81 year old female to begin heparin for chest pain. HgB stable  Goal of Therapy:  Heparin level 0.3-0.7 units/ml Monitor platelets by anticoagulation protocol: Yes   Plan:  Heparin 3000 units iv bolus x 1 Heparin drip at 1150 units / hr Heparin level 8 hours after heparin starts Daily heparin level, CBC  Thank you Anette Guarneri, PharmD  07/18/2021,2:00 AM

## 2021-07-18 NOTE — Progress Notes (Signed)
Patient declined taking oral contrast for SBO protocol. Attending and Surgery notified. States "what's the point if I'm not a candidate for surgery?" Encouraged pt to voice concerns with MD in AM. Oncoming RN made aware.

## 2021-07-18 NOTE — Assessment & Plan Note (Signed)
-  I have discussed code status with the patient and she would not desire resuscitation and would prefer to die a natural death should that situation arise. ?-She will need a gold out of facility DNR form at the time of discharge ?

## 2021-07-19 ENCOUNTER — Inpatient Hospital Stay (HOSPITAL_COMMUNITY): Payer: Medicare Other

## 2021-07-19 DIAGNOSIS — K56609 Unspecified intestinal obstruction, unspecified as to partial versus complete obstruction: Secondary | ICD-10-CM | POA: Diagnosis not present

## 2021-07-19 DIAGNOSIS — Z7952 Long term (current) use of systemic steroids: Secondary | ICD-10-CM

## 2021-07-19 DIAGNOSIS — E1121 Type 2 diabetes mellitus with diabetic nephropathy: Secondary | ICD-10-CM

## 2021-07-19 DIAGNOSIS — Z66 Do not resuscitate: Secondary | ICD-10-CM | POA: Diagnosis not present

## 2021-07-19 LAB — BASIC METABOLIC PANEL
Anion gap: 7 (ref 5–15)
BUN: 20 mg/dL (ref 8–23)
CO2: 27 mmol/L (ref 22–32)
Calcium: 8 mg/dL — ABNORMAL LOW (ref 8.9–10.3)
Chloride: 107 mmol/L (ref 98–111)
Creatinine, Ser: 0.86 mg/dL (ref 0.44–1.00)
GFR, Estimated: >60 mL/min (ref >60)
Glucose, Bld: 154 mg/dL — ABNORMAL HIGH (ref 70–99)
Potassium: 3.8 mmol/L (ref 3.5–5.1)
Sodium: 141 mmol/L (ref 135–145)

## 2021-07-19 LAB — GLUCOSE, CAPILLARY
Glucose-Capillary: 132 mg/dL — ABNORMAL HIGH (ref 70–99)
Glucose-Capillary: 143 mg/dL — ABNORMAL HIGH (ref 70–99)
Glucose-Capillary: 150 mg/dL — ABNORMAL HIGH (ref 70–99)
Glucose-Capillary: 243 mg/dL — ABNORMAL HIGH (ref 70–99)

## 2021-07-19 LAB — CBC
HCT: 36.2 % (ref 36.0–46.0)
Hemoglobin: 11.2 g/dL — ABNORMAL LOW (ref 12.0–15.0)
MCH: 29.2 pg (ref 26.0–34.0)
MCHC: 30.9 g/dL (ref 30.0–36.0)
MCV: 94.3 fL (ref 80.0–100.0)
Platelets: 202 10*3/uL (ref 150–400)
RBC: 3.84 MIL/uL — ABNORMAL LOW (ref 3.87–5.11)
RDW: 13.9 % (ref 11.5–15.5)
WBC: 7.5 10*3/uL (ref 4.0–10.5)
nRBC: 0 % (ref 0.0–0.2)

## 2021-07-19 MED ORDER — SENNA 8.6 MG PO TABS
1.0000 | ORAL_TABLET | Freq: Every day | ORAL | Status: DC
  Administered 2021-07-19: 8.6 mg via ORAL
  Filled 2021-07-19: qty 1

## 2021-07-19 MED ORDER — SUCRALFATE 1 GM/10ML PO SUSP
1.0000 g | Freq: Four times a day (QID) | ORAL | Status: DC
  Administered 2021-07-19: 1 g via ORAL
  Filled 2021-07-19: qty 10

## 2021-07-19 NOTE — Progress Notes (Signed)
Subjective/Chief Complaint: Pt complains of midline CP No SOB ?heartburn? Wants to go home  No nausea or vomiting  Passing gas  No oral contrast pt refused  Has poor understanding of her situation No abdominal pain    Objective: Vital signs in last 24 hours: Temp:  [98.1 F (36.7 C)-98.7 F (37.1 C)] 98.1 F (36.7 C) (02/18 0758) Pulse Rate:  [56-75] 64 (02/18 0758) Resp:  [16-19] 18 (02/18 0758) BP: (129-152)/(42-64) 152/61 (02/18 0758) SpO2:  [90 %-94 %] 93 % (02/18 0758) Last BM Date : 07/17/21  Intake/Output from previous day: 02/17 0701 - 02/18 0700 In: 1020.9 [I.V.:1020.9] Out: -  Intake/Output this shift: No intake/output data recorded.  General appearance: alert  Abdomen: obese large soft NT reducible hernia   Lab Results:  Recent Labs    07/17/21 2118 07/19/21 0128  WBC 11.3* 7.5  HGB 13.6 11.2*  HCT 44.2 36.2  PLT 304 202   BMET Recent Labs    07/17/21 2118 07/19/21 0128  NA 138 141  K 3.6 3.8  CL 102 107  CO2 26 27  GLUCOSE 213* 154*  BUN 15 20  CREATININE 0.84 0.86  CALCIUM 9.2 8.0*   PT/INR No results for input(s): LABPROT, INR in the last 72 hours. ABG No results for input(s): PHART, HCO3 in the last 72 hours.  Invalid input(s): PCO2, PO2  Studies/Results: CT ABDOMEN PELVIS WO CONTRAST  Addendum Date: 07/18/2021   ADDENDUM REPORT: 07/18/2021 04:31 ADDENDUM: Critical findings were reported to Dr. Leonette Monarch at 4:31 a.m. Electronically Signed   By: Brett Fairy M.D.   On: 07/18/2021 04:31   Result Date: 07/18/2021 CLINICAL DATA:  Nausea/vomiting. EXAM: CT ABDOMEN AND PELVIS WITHOUT CONTRAST TECHNIQUE: Multidetector CT imaging of the abdomen and pelvis was performed following the standard protocol without IV contrast. RADIATION DOSE REDUCTION: This exam was performed according to the departmental dose-optimization program which includes automated exposure control, adjustment of the mA and/or kV according to patient size and/or use  of iterative reconstruction technique. COMPARISON:  01/07/2021. FINDINGS: Lower chest: No acute abnormality. Hepatobiliary: No focal liver abnormality is seen. Stones are present within the gallbladder. No biliary ductal dilatation. Pancreas: Unremarkable. No pancreatic ductal dilatation or surrounding inflammatory changes. Spleen: Normal in size without focal abnormality. Adrenals/Urinary Tract: The adrenal glands are within normal limits. Cysts are noted in the kidneys bilaterally. No ureteral calculus or obstructive uropathy bilaterally. The bladder is unremarkable. Stomach/Bowel: Gastric surgery changes are noted. There is clumping of the small bowel in the mid abdomen with dilated loops of small bowel measuring up to 4.2 cm. Multiple anastomotic sites are present. The distal small bowel is normal in caliber, however a definitive transition point is not identified. There is surrounding fat stranding and edema in the mesentery in this region. No free air or pneumatosis is seen. The appendix is not visualized on exam. The colon is nondistended. Vascular/Lymphatic: Aortic atherosclerosis. No enlarged abdominal or pelvic lymph nodes. Reproductive: Status post hysterectomy. No adnexal masses. Other: No abdominopelvic ascites. There is a stable density with calcification in the gastrocolic ligament which is unchanged from prior exams. There is diastasis of the rectus abdominus with a large broad-based hernia containing bowel. Musculoskeletal: Degenerative changes are present in the thoracolumbar spine. There is a stable compression deformity at T12. IMPRESSION: 1. Clumping of small bowel in the mid abdomen with multiple dilated loops of small bowel measuring up to 4.2 cm in diameter. Mild mesenteric edema and anastomotic sites are noted in this  region. However a definitive transition point is not identified. Findings are concerning for possible early or partial small bowel obstruction. Surgical consultation is  recommended. 2. Bilateral renal cysts. 3. Cholelithiasis. 4. Diastasis of the rectus abdominus with a large broad-based ventral abdominal wall hernia. 5. Aortic atherosclerosis. Electronically Signed: By: Brett Fairy M.D. On: 07/18/2021 04:25   DG Chest Port 1 View  Result Date: 07/18/2021 CLINICAL DATA:  Emesis, nausea. EXAM: PORTABLE CHEST 1 VIEW COMPARISON:  05/07/2021. FINDINGS: The heart is enlarged and the mediastinal contour is within normal limits. Atherosclerotic calcification of the aorta is noted. The lungs are clear without effusions or infiltrates. No pneumothorax. No acute osseous abnormality. IMPRESSION: Cardiomegaly with no active disease. Electronically Signed   By: Brett Fairy M.D.   On: 07/18/2021 02:07   ECHOCARDIOGRAM COMPLETE  Result Date: 07/18/2021    ECHOCARDIOGRAM REPORT   Patient Name:   [name] Date of Exam: 07/18/2021 Medical Rec #:  [id number]     Height:       62.0 in Accession #:    [id number]    Weight:       189.6 lb Date of Birth:  02-05-1941      BSA:          1.869 m Patient Age:    81 years      BP:           127/62 mmHg Patient Gender: F             HR:           72 bpm. Exam Location:  Inpatient Procedure: 2D Echo, Cardiac Doppler and Color Doppler Indications:     Elevated troponin  History:         Patient has prior history of Echocardiogram examinations, most                  recent 02/29/2020. CAD; Risk Factors:Hypertension, Diabetes and                  Sleep Apnea. Hx Takatsubo cardiomyopathy.  Sonographer:     Clayton Lefort RDCS (AE) Referring Phys:  [id number] [facility] MARTIN Diagnosing Phys: Kirk Ruths McleanMD IMPRESSIONS  1. Left ventricular ejection fraction, by estimation, is 55 to 60%. The left ventricle has normal function. The left ventricle has no regional wall motion abnormalities. There is mild left ventricular hypertrophy. Left ventricular diastolic parameters are consistent with Grade II diastolic dysfunction (pseudonormalization).  2. Right  ventricular systolic function is normal. The right ventricular size is mildly enlarged. Tricuspid regurgitation signal is inadequate for assessing PA pressure.  3. Right atrial size was mildly dilated.  4. The mitral valve is normal in structure. No evidence of mitral valve regurgitation. No evidence of mitral stenosis.  5. The aortic valve is tricuspid. Aortic valve regurgitation is not visualized. Aortic valve sclerosis/calcification is present, without any evidence of aortic stenosis.  6. The inferior vena cava is dilated in size with <50% respiratory variability, suggesting right atrial pressure of 15 mmHg. FINDINGS  Left Ventricle: Left ventricular ejection fraction, by estimation, is 55 to 60%. The left ventricle has normal function. The left ventricle has no regional wall motion abnormalities. The left ventricular internal cavity size was normal in size. There is  mild left ventricular hypertrophy. Left ventricular diastolic parameters are consistent with Grade II diastolic dysfunction (pseudonormalization). Right Ventricle: The right ventricular size is mildly enlarged. No increase in right ventricular wall thickness. Right ventricular systolic function is  normal. Tricuspid regurgitation signal is inadequate for assessing PA pressure. Left Atrium: Left atrial size was normal in size. Right Atrium: Right atrial size was mildly dilated. Pericardium: Trivial pericardial effusion is present. Mitral Valve: The mitral valve is normal in structure. There is mild calcification of the mitral valve leaflet(s). No evidence of mitral valve regurgitation. No evidence of mitral valve stenosis. Tricuspid Valve: The tricuspid valve is normal in structure. Tricuspid valve regurgitation is not demonstrated. Aortic Valve: The aortic valve is tricuspid. Aortic valve regurgitation is not visualized. Aortic valve sclerosis/calcification is present, without any evidence of aortic stenosis. Aortic valve mean gradient measures 4.0  mmHg. Aortic valve peak gradient measures 8.2 mmHg. Aortic valve area, by VTI measures 2.59 cm. Pulmonic Valve: The pulmonic valve was normal in structure. Pulmonic valve regurgitation is not visualized. Aorta: The aortic root is normal in size and structure. Venous: The inferior vena cava is dilated in size with less than 50% respiratory variability, suggesting right atrial pressure of 15 mmHg. IAS/Shunts: No atrial level shunt detected by color flow Doppler.  LEFT VENTRICLE PLAX 2D LVIDd:         4.20 cm   Diastology LVIDs:         3.10 cm   LV e' medial:    6.30 cm/s LV PW:         1.70 cm   LV E/e' medial:  15.5 LV IVS:        1.60 cm   LV e' lateral:   8.18 cm/s LVOT diam:     2.00 cm   LV E/e' lateral: 12.0 LV SV:         61 LV SV Index:   32 LVOT Area:     3.14 cm  RIGHT VENTRICLE             IVC RV Basal diam:  3.40 cm     IVC diam: 2.30 cm RV S prime:     15.20 cm/s TAPSE (M-mode): 1.9 cm LEFT ATRIUM             Index        RIGHT ATRIUM           Index LA diam:        4.00 cm 2.14 cm/m   RA Area:     21.10 cm LA Vol (A2C):   45.8 ml 24.51 ml/m  RA Volume:   66.60 ml  35.64 ml/m LA Vol (A4C):   43.0 ml 23.01 ml/m LA Biplane Vol: 46.0 ml 24.62 ml/m  AORTIC VALVE AV Area (Vmax):    2.28 cm AV Area (Vmean):   2.13 cm AV Area (VTI):     2.59 cm AV Vmax:           143.00 cm/s AV Vmean:          94.900 cm/s AV VTI:            0.234 m AV Peak Grad:      8.2 mmHg AV Mean Grad:      4.0 mmHg LVOT Vmax:         104.00 cm/s LVOT Vmean:        64.200 cm/s LVOT VTI:          0.193 m LVOT/AV VTI ratio: 0.82  AORTA Ao Root diam: 3.00 cm Ao Asc diam:  3.30 cm MITRAL VALVE MV Area (PHT): 2.75 cm    SHUNTS MV Decel Time: 276 msec    Systemic VTI:  0.19 m MV E velocity: 97.90 cm/s  Systemic Diam: 2.00 cm MV A velocity: 91.40 cm/s MV E/A ratio:  1.07 Dalton McleanMD Electronically signed by Franki Monte Signature Date/Time: 07/18/2021/2:48:09 PM    Final (Updated)     Anti-infectives: Anti-infectives (From  admission, onward)    None       Assessment/Plan: Patient Active Problem List   Diagnosis Date Noted   Hypertensive urgency 07/18/2021   DNR (do not resuscitate) 07/18/2021   SBO (small bowel obstruction) ([place]) 01/02/2021   Current chronic use of systemic steroids 01/02/2021   Venous insufficiency 06/10/2020   High risk social situation 06/10/2020   Moderate nonproliferative diabetic retinopathy of left eye ([place]) 06/06/2020   Macular pucker, left eye 12/05/2019   Non-proliferative diabetic retinopathy, severe, right eye ([place]) 12/05/2019   Acute respiratory infection 06/23/2016   Marital dysfunction 01/22/2016   Coronary artery disease involving native coronary artery of native heart with angina pectoris ([place]) 08/22/2014   Takotsubo syndrome 08/22/2014   Obesity (BMI 30-39.9)    Recurrent ventral incisional hernia 08/20/2014   Elevated troponin    Chronic pain syndrome 08/22/2013   Chest pain at rest 04/17/2013   Essential hypertension, benign 04/17/2013   Controlled diabetes mellitus with nephropathy ([place]) 01/27/2013   HLD (hyperlipidemia) 01/27/2013   Lumbago    DOE (dyspnea on exertion)    Hypothyroidism    Vitamin B12 deficiency 12/18/2009   Depression 12/18/2009   Vitamin D deficiency disease 02/13/2009   Esophageal spasm 02/13/2009   MCI (mild cognitive impairment) 10/24/2008   Pulmonary nodule 06/18/2008   Esophageal reflux 04/12/2007   Carotid bruit 04/21/2006   Extrinsic asthma 08/25/2005   Gout 11/12/2004   Unsteady gait 10/20/2004     Check KUB Can try clears  Carafate for chest discomfort since it sounds like  reflux  If it persists, recommend contacting medical service   Total time 20 minutes    LOS: 1 day    Turner Daniels MD  07/19/2021

## 2021-07-19 NOTE — Progress Notes (Signed)
Mobility Specialist Progress Note:   07/19/21 1514  Mobility  Activity Ambulated with assistance in hallway  Level of Assistance Standby assist, set-up cues, supervision of patient - no hands on  Assistive Device Front wheel walker  Distance Ambulated (ft) 250 ft  Activity Response Tolerated well  $Mobility charge 1 Mobility   Pt received EOB willing to participate in mobility. No complaints of pain. Pt left EOB with call bell in reach and all needs met.  [place] [facility] Public librarian Phone [phone]

## 2021-07-19 NOTE — Discharge Summary (Signed)
Physician Discharge Summary  [name] LZJ:[id number] DOB: 1940-06-10 DOA: 07/17/2021  PCP: Wardell Honour, MD  Admit date: 07/17/2021 Discharge date: 07/19/2021  Admitted From: home Disposition:  home  Recommendations for Outpatient Follow-up:  Follow up with PCP in 1-2 weeks  Home Health: none Equipment/Devices: none  Discharge Condition: stable CODE STATUS: DNR Diet recommendation: regular  HPI: Per admitting MD, [name] is a 81 y.o. female with medical history significant of DM; depression; HTN; HLD;  dementia; and hypothyroidism presenting with n/v.  She developed severe abdominal pain and refractory n/v.  She got very weak.  Symptoms started yesterday at 3pm.  Pain was in her lower abdomen.  She threw up TNTC times, last emesis was this AM about 645.  She does feel better and is not having pain.  She no longer feels nauseated.  Last BM was yesterday and was normal.  Hospital Course / Discharge diagnoses: Assessment and Plan: Principal problem SBO (small bowel obstruction) ([place])- (present on admission) -Patient with prior h/o abdominal surgeries presenting with acute onset of abdominal pain with n/v, abdominal distention, and CT findings c/w partial SBO.  She had a similar admission in August 2022.  General surgery consulted, and she was managed conservatively.  Clinically has improved, she is having bowel movement, her diet was advanced and she is tolerating a soft diet.  She will be discharged home in stable condition and was recommended to follow-up as an outpatient  Active problems Hypertensive urgency- (present on admission) -Upon presentation, the patient was found to have a significantly elevated BP with troponin elevation.  Blood pressure is improved with resumption of her home medications Troponin elevation-patient without ACS type symptoms, cardiology evaluated patient, did not pursue ischemic work-up.  Troponin elevation is likely multifactorial in the  setting of poorly controlled blood pressure as well as acute illness.  Her upper abdominal discomfort was relieved with antiacids Current chronic use of systemic steroids -It is not entirely clear why she takes daily prednisone, but it has been continued for now High risk social situation-She has 2 sons who live in [place], she lives alone in [place], in a house located at the end of a dirt road about 1 mile long. She acknowledges that this is unsafe and reports a plan to move to ALF in [place] Obesity, class I -Body mass index is 34.68 kg/m. She is s/p remote gastric bypass.  Patient will benefit from weight loss Recurrent ventral incisional hernia- (present on admission) -Large ventral hernia since very remote gastric bypass surgery. 2 failed repairs with mesh to follow. Outpatient follow up HLD (hyperlipidemia)- (present on admission) -Continue Lipitor Controlled diabetes mellitus with nephropathy ([place])- (present on admission) -Last A1c was 6.9. Resume home medications  Hypothyroidism- (present on admission) -Normal TSH previously, not on medications MCI (mild cognitive impairment)- (present on admission) -Continue Celexa. Subclinical as it is undetectable  Principal Problem:   SBO (small bowel obstruction) (HCC) Active Problems:   MCI (mild cognitive impairment)   Hypothyroidism   Controlled diabetes mellitus with nephropathy (HCC)   HLD (hyperlipidemia)   Essential hypertension, benign   Recurrent ventral incisional hernia   Obesity (BMI 30-39.9)   High risk social situation   Current chronic use of systemic steroids   Hypertensive urgency   DNR (do not resuscitate)   Sepsis ruled out   Discharge Instructions   Allergies as of 07/19/2021       Reactions   Ciprofloxacin Rash   Advair Diskus [fluticasone-salmeterol] Other (  See Comments)   Throat closes   Albuterol    REACTION: closes throat   Alendronate Sodium Itching   Benadryl [diphenhydramine Hcl] Itching   Benzonatate     REACTION: rash/hives   Cephalexin Nausea Only   Gabapentin Other (See Comments)   Loss of memory   Iohexol     Desc: rash and DIF BREATHING   Keflex [cephalexin] Nausea And Vomiting   Morphine Nausea And Vomiting   Other Other (See Comments)   Decongestants- keeps pt awake at night, increased heart rate   Propoxyphene Hcl    Doesn't recall   Reclast [zoledronic Acid] Other (See Comments)   Chest pain   Avapro [irbesartan] Rash   Codeine Nausea And Vomiting, Swelling, Rash   Tessalon Perles Rash   Tramadol Nausea Only, Rash        Medication List     TAKE these medications    amLODipine 2.5 MG tablet Commonly known as: NORVASC Take one tablet by mouth once daily. What changed:  how much to take how to take this when to take this additional instructions   aspirin 81 MG tablet Take 81 mg by mouth daily.   atorvastatin 20 MG tablet Commonly known as: LIPITOR TAKE 1 TABLET BY MOUTH EVERY DAY   brimonidine 0.2 % ophthalmic solution Commonly known as: ALPHAGAN Place 1 drop into both eyes 3 (three) times daily.   cetirizine 10 MG tablet Commonly known as: ZYRTEC Take 10 mg by mouth daily as needed for allergies. For allergies   citalopram 40 MG tablet Commonly known as: CELEXA Take 1 tablet (40 mg total) by mouth daily.   docusate sodium 100 MG capsule Commonly known as: COLACE Take 100 mg by mouth daily.   ferrous sulfate 325 (65 FE) MG tablet Take 325 mg by mouth daily with breakfast.   FreeStyle Libre 2 [facility] Use to test blood sugar three times daily.  Dx: E11.9   FreeStyle Libre 2 Sensor Misc Use to test blood sugar three times daily. Dx: E11.9   furosemide 20 MG tablet Commonly known as: LASIX Take 20 mg by mouth 2 (two) times daily as needed for fluid.   glipiZIDE 10 MG 24 hr tablet Commonly known as: GLUCOTROL XL Take 10 mg by mouth daily with breakfast.   HumuLIN R 100 units/mL injection Generic drug: insulin regular Inject 5  Units into the skin as needed for high blood sugar.   insulin NPH Human 100 UNIT/ML injection Commonly known as: HumuLIN N INJECT 40 UNITS IN THE MORNING AND 40 UNITS IN THE EVENING What changed:  how much to take how to take this when to take this additional instructions   Insulin Syringes (Disposable) U-100 1 ML Misc Use to inject insulin Dx: Z61.0960   isosorbide mononitrate 30 MG 24 hr tablet Commonly known as: IMDUR TAKE 1 TABLET BY MOUTH EVERY DAY   losartan-hydrochlorothiazide 50-12.5 MG tablet Commonly known as: HYZAAR TAKE 1 TABLET BY MOUTH EVERY DAY FOR BLOOD PRESSURE What changed: See the new instructions.   nitroGLYCERIN 0.4 MG SL tablet Commonly known as: NITROSTAT Place 0.4 mg under the tongue every 5 (five) minutes as needed for chest pain.   omeprazole 20 MG capsule Commonly known as: PRILOSEC Take 1 capsule (20 mg total) by mouth daily.   predniSONE 5 MG tablet Commonly known as: DELTASONE TAKE 1 TABLET BY MOUTH EVERY DAY IN THE MORNING WITH FOOD TO REDUCE PAIN What changed: See the new instructions.   VITAMIN B-12  PO Take 1 tablet by mouth daily.   Vitamin D3 125 MCG (5000 UT) Caps Take 5,000 Units by mouth daily. For supplement         Consultations: General surgery   Procedures/Studies:  CT ABDOMEN PELVIS WO CONTRAST  Addendum Date: 07/18/2021   ADDENDUM REPORT: 07/18/2021 04:31 ADDENDUM: Critical findings were reported to Dr. Leonette Monarch at 4:31 a.m. Electronically Signed   By: Brett Fairy M.D.   On: 07/18/2021 04:31   Result Date: 07/18/2021 CLINICAL DATA:  Nausea/vomiting. EXAM: CT ABDOMEN AND PELVIS WITHOUT CONTRAST TECHNIQUE: Multidetector CT imaging of the abdomen and pelvis was performed following the standard protocol without IV contrast. RADIATION DOSE REDUCTION: This exam was performed according to the departmental dose-optimization program which includes automated exposure control, adjustment of the mA and/or kV according to patient  size and/or use of iterative reconstruction technique. COMPARISON:  01/07/2021. FINDINGS: Lower chest: No acute abnormality. Hepatobiliary: No focal liver abnormality is seen. Stones are present within the gallbladder. No biliary ductal dilatation. Pancreas: Unremarkable. No pancreatic ductal dilatation or surrounding inflammatory changes. Spleen: Normal in size without focal abnormality. Adrenals/Urinary Tract: The adrenal glands are within normal limits. Cysts are noted in the kidneys bilaterally. No ureteral calculus or obstructive uropathy bilaterally. The bladder is unremarkable. Stomach/Bowel: Gastric surgery changes are noted. There is clumping of the small bowel in the mid abdomen with dilated loops of small bowel measuring up to 4.2 cm. Multiple anastomotic sites are present. The distal small bowel is normal in caliber, however a definitive transition point is not identified. There is surrounding fat stranding and edema in the mesentery in this region. No free air or pneumatosis is seen. The appendix is not visualized on exam. The colon is nondistended. Vascular/Lymphatic: Aortic atherosclerosis. No enlarged abdominal or pelvic lymph nodes. Reproductive: Status post hysterectomy. No adnexal masses. Other: No abdominopelvic ascites. There is a stable density with calcification in the gastrocolic ligament which is unchanged from prior exams. There is diastasis of the rectus abdominus with a large broad-based hernia containing bowel. Musculoskeletal: Degenerative changes are present in the thoracolumbar spine. There is a stable compression deformity at T12. IMPRESSION: 1. Clumping of small bowel in the mid abdomen with multiple dilated loops of small bowel measuring up to 4.2 cm in diameter. Mild mesenteric edema and anastomotic sites are noted in this region. However a definitive transition point is not identified. Findings are concerning for possible early or partial small bowel obstruction. Surgical  consultation is recommended. 2. Bilateral renal cysts. 3. Cholelithiasis. 4. Diastasis of the rectus abdominus with a large broad-based ventral abdominal wall hernia. 5. Aortic atherosclerosis. Electronically Signed: By: Brett Fairy M.D. On: 07/18/2021 04:25   DG Abd 1 View  Result Date: 07/19/2021 CLINICAL DATA:  81 year old female with possible small bowel obstruction on CT Abdomen and Pelvis yesterday. EXAM: ABDOMEN - 1 VIEW COMPARISON:  CT Abdomen and Pelvis yesterday. FINDINGS: Ongoing gas distended small bowel loops in the mid abdomen corresponding to the site of hernia and conglomerated loops on the CT yesterday. Ongoing paucity of large bowel gas. Superimposed ventral abdominal mesh postoperative changes. Stable visualized osseous structures. IMPRESSION: No improvement in the appearance of sequestered and gas distended small bowel loops in the mid abdomen since the CT yesterday, suspicious for mechanical small bowel obstruction. Previous abdominal wall surgery. Electronically Signed   By: Genevie Ann M.D.   On: 07/19/2021 09:40   DG Chest Port 1 View  Result Date: 07/18/2021 CLINICAL DATA:  Emesis, nausea. EXAM:  PORTABLE CHEST 1 VIEW COMPARISON:  05/07/2021. FINDINGS: The heart is enlarged and the mediastinal contour is within normal limits. Atherosclerotic calcification of the aorta is noted. The lungs are clear without effusions or infiltrates. No pneumothorax. No acute osseous abnormality. IMPRESSION: Cardiomegaly with no active disease. Electronically Signed   By: Brett Fairy M.D.   On: 07/18/2021 02:07   ECHOCARDIOGRAM COMPLETE  Result Date: 07/18/2021    ECHOCARDIOGRAM REPORT   Patient Name:   [name] Date of Exam: 07/18/2021 Medical Rec #:  [id number]     Height:       62.0 in Accession #:    [id number]    Weight:       189.6 lb Date of Birth:  [birth year]      BSA:          1.869 m Patient Age:    17 years      BP:           127/62 mmHg Patient Gender: F             HR:           72  bpm. Exam Location:  Inpatient Procedure: 2D Echo, Cardiac Doppler and Color Doppler Indications:     Elevated troponin  History:         Patient has prior history of Echocardiogram examinations, most                  recent 02/29/2020. CAD; Risk Factors:Hypertension, Diabetes and                  Sleep Apnea. Hx Takatsubo cardiomyopathy.  Sonographer:     Clayton Lefort RDCS (AE) Referring Phys:  [id number] [facility] MARTIN Diagnosing Phys: Kirk Ruths McleanMD IMPRESSIONS  1. Left ventricular ejection fraction, by estimation, is 55 to 60%. The left ventricle has normal function. The left ventricle has no regional wall motion abnormalities. There is mild left ventricular hypertrophy. Left ventricular diastolic parameters are consistent with Grade II diastolic dysfunction (pseudonormalization).  2. Right ventricular systolic function is normal. The right ventricular size is mildly enlarged. Tricuspid regurgitation signal is inadequate for assessing PA pressure.  3. Right atrial size was mildly dilated.  4. The mitral valve is normal in structure. No evidence of mitral valve regurgitation. No evidence of mitral stenosis.  5. The aortic valve is tricuspid. Aortic valve regurgitation is not visualized. Aortic valve sclerosis/calcification is present, without any evidence of aortic stenosis.  6. The inferior vena cava is dilated in size with <50% respiratory variability, suggesting right atrial pressure of 15 mmHg. FINDINGS  Left Ventricle: Left ventricular ejection fraction, by estimation, is 55 to 60%. The left ventricle has normal function. The left ventricle has no regional wall motion abnormalities. The left ventricular internal cavity size was normal in size. There is  mild left ventricular hypertrophy. Left ventricular diastolic parameters are consistent with Grade II diastolic dysfunction (pseudonormalization). Right Ventricle: The right ventricular size is mildly enlarged. No increase in right ventricular wall  thickness. Right ventricular systolic function is normal. Tricuspid regurgitation signal is inadequate for assessing PA pressure. Left Atrium: Left atrial size was normal in size. Right Atrium: Right atrial size was mildly dilated. Pericardium: Trivial pericardial effusion is present. Mitral Valve: The mitral valve is normal in structure. There is mild calcification of the mitral valve leaflet(s). No evidence of mitral valve regurgitation. No evidence of mitral valve stenosis. Tricuspid Valve: The tricuspid valve is normal in structure. Tricuspid valve  regurgitation is not demonstrated. Aortic Valve: The aortic valve is tricuspid. Aortic valve regurgitation is not visualized. Aortic valve sclerosis/calcification is present, without any evidence of aortic stenosis. Aortic valve mean gradient measures 4.0 mmHg. Aortic valve peak gradient measures 8.2 mmHg. Aortic valve area, by VTI measures 2.59 cm. Pulmonic Valve: The pulmonic valve was normal in structure. Pulmonic valve regurgitation is not visualized. Aorta: The aortic root is normal in size and structure. Venous: The inferior vena cava is dilated in size with less than 50% respiratory variability, suggesting right atrial pressure of 15 mmHg. IAS/Shunts: No atrial level shunt detected by color flow Doppler.  LEFT VENTRICLE PLAX 2D LVIDd:         4.20 cm   Diastology LVIDs:         3.10 cm   LV e' medial:    6.30 cm/s LV PW:         1.70 cm   LV E/e' medial:  15.5 LV IVS:        1.60 cm   LV e' lateral:   8.18 cm/s LVOT diam:     2.00 cm   LV E/e' lateral: 12.0 LV SV:         61 LV SV Index:   32 LVOT Area:     3.14 cm  RIGHT VENTRICLE             IVC RV Basal diam:  3.40 cm     IVC diam: 2.30 cm RV S prime:     15.20 cm/s TAPSE (M-mode): 1.9 cm LEFT ATRIUM             Index        RIGHT ATRIUM           Index LA diam:        4.00 cm 2.14 cm/m   RA Area:     21.10 cm LA Vol (A2C):   45.8 ml 24.51 ml/m  RA Volume:   66.60 ml  35.64 ml/m LA Vol (A4C):   43.0 ml  23.01 ml/m LA Biplane Vol: 46.0 ml 24.62 ml/m  AORTIC VALVE AV Area (Vmax):    2.28 cm AV Area (Vmean):   2.13 cm AV Area (VTI):     2.59 cm AV Vmax:           143.00 cm/s AV Vmean:          94.900 cm/s AV VTI:            0.234 m AV Peak Grad:      8.2 mmHg AV Mean Grad:      4.0 mmHg LVOT Vmax:         104.00 cm/s LVOT Vmean:        64.200 cm/s LVOT VTI:          0.193 m LVOT/AV VTI ratio: 0.82  AORTA Ao Root diam: 3.00 cm Ao Asc diam:  3.30 cm MITRAL VALVE MV Area (PHT): 2.75 cm    SHUNTS MV Decel Time: 276 msec    Systemic VTI:  0.19 m MV E velocity: 97.90 cm/s  Systemic Diam: 2.00 cm MV A velocity: 91.40 cm/s MV E/A ratio:  1.07 Dalton McleanMD Electronically signed by Franki Monte Signature Date/Time: 07/18/2021/2:48:09 PM    Final (Updated)      Subjective: - no chest pain, shortness of breath, no abdominal pain, nausea or vomiting.   Discharge Exam: BP (!) 152/61 (BP Location: Right Wrist)    Pulse 64  Temp 98.1 F (36.7 C) (Oral)    Resp 18    Wt 86 kg    SpO2 93%    BMI 34.68 kg/m   General: Pt is alert, awake, not in acute distress Cardiovascular: RRR, S1/S2 +, no rubs, no gallops Respiratory: CTA bilaterally, no wheezing, no rhonchi Abdominal: Soft, NT, ND, bowel sounds + Extremities: no edema, no cyanosis  The results of significant diagnostics from this hospitalization (including imaging, microbiology, ancillary and laboratory) are listed below for reference.     Microbiology: Recent Results (from the past 240 hour(s))  Resp Panel by RT-PCR (Flu A&B, Covid) Nasopharyngeal Swab     Status: None   Collection Time: 07/18/21  1:59 AM   Specimen: Nasopharyngeal Swab; Nasopharyngeal(NP) swabs in vial transport medium  Result Value Ref Range Status   SARS Coronavirus 2 by RT PCR NEGATIVE NEGATIVE Final    Comment: (NOTE) SARS-CoV-2 target nucleic acids are NOT DETECTED.  The SARS-CoV-2 RNA is generally detectable in upper respiratory specimens during the acute phase of  infection. The lowest concentration of SARS-CoV-2 viral copies this assay can detect is 138 copies/mL. A negative result does not preclude SARS-Cov-2 infection and should not be used as the sole basis for treatment or other patient management decisions. A negative result may occur with  improper specimen collection/handling, submission of specimen other than nasopharyngeal swab, presence of viral mutation(s) within the areas targeted by this assay, and inadequate number of viral copies(<138 copies/mL). A negative result must be combined with clinical observations, patient history, and epidemiological information. The expected result is Negative.  Fact Sheet for Patients:  EntrepreneurPulse.com.au  Fact Sheet for Healthcare Providers:  IncredibleEmployment.be  This test is no t yet approved or cleared by the [place] FDA and  has been authorized for detection and/or diagnosis of SARS-CoV-2 by FDA under an Emergency Use Authorization (EUA). This EUA will remain  in effect (meaning this test can be used) for the duration of the COVID-19 declaration under Section 564(b)(1) of the Act, 21 U.S.C.section 360bbb-3(b)(1), unless the authorization is terminated  or revoked sooner.       Influenza A by PCR NEGATIVE NEGATIVE Final   Influenza B by PCR NEGATIVE NEGATIVE Final    Comment: (NOTE) The Xpert Xpress SARS-CoV-2/FLU/RSV plus assay is intended as an aid in the diagnosis of influenza from Nasopharyngeal swab specimens and should not be used as a sole basis for treatment. Nasal washings and aspirates are unacceptable for Xpert Xpress SARS-CoV-2/FLU/RSV testing.  Fact Sheet for Patients: EntrepreneurPulse.com.au  Fact Sheet for Healthcare Providers: IncredibleEmployment.be  This test is not yet approved or cleared by the [place] FDA and has been authorized for detection and/or diagnosis of SARS-CoV-2  by FDA under an Emergency Use Authorization (EUA). This EUA will remain in effect (meaning this test can be used) for the duration of the COVID-19 declaration under Section 564(b)(1) of the Act, 21 U.S.C. section 360bbb-3(b)(1), unless the authorization is terminated or revoked.  Performed at [facility], [place] [address], [place], [place] [address]      Labs: Basic Metabolic Panel: Recent Labs  Lab 07/17/21 2118 07/19/21 0128  NA 138 141  K 3.6 3.8  CL 102 107  CO2 26 27  GLUCOSE 213* 154*  BUN 15 20  CREATININE 0.84 0.86  CALCIUM 9.2 8.0*   Liver Function Tests: Recent Labs  Lab 07/17/21 2118  AST 24  ALT 57*  ALKPHOS 93  BILITOT 0.6  PROT 6.4*  ALBUMIN 3.5   CBC: Recent Labs  Lab 07/17/21 2118 07/19/21 0128  WBC 11.3* 7.5  HGB 13.6 11.2*  HCT 44.2 36.2  MCV 94.8 94.3  PLT 304 202   CBG: Recent Labs  Lab 07/18/21 2129 07/19/21 0001 07/19/21 0407 07/19/21 0756 07/19/21 1155  GLUCAP 135* 143* 132* 150* 243*   Hgb A1c No results for input(s): HGBA1C in the last 72 hours. Lipid Profile No results for input(s): CHOL, HDL, LDLCALC, TRIG, CHOLHDL, LDLDIRECT in the last 72 hours. Thyroid function studies No results for input(s): TSH, T4TOTAL, T3FREE, THYROIDAB in the last 72 hours.  Invalid input(s): FREET3 Urinalysis    Component Value Date/Time   COLORURINE YELLOW 07/17/2021 [place] 07/17/2021 2341   LABSPEC 1.015 07/17/2021 2341   PHURINE 5.0 07/17/2021 2341   GLUCOSEU NEGATIVE 07/17/2021 2341   HGBUR NEGATIVE 07/17/2021 2341   BILIRUBINUR NEGATIVE 07/17/2021 2341   KETONESUR 20 (A) 07/17/2021 2341   PROTEINUR 100 (A) 07/17/2021 2341   UROBILINOGEN 0.2 10/19/2014 2035   NITRITE NEGATIVE 07/17/2021 2341   LEUKOCYTESUR NEGATIVE 07/17/2021 2341    FURTHER DISCHARGE INSTRUCTIONS:   Get Medicines reviewed and adjusted: Please take all your medications with you for your next visit with your Primary MD    Laboratory/radiological data: Please request your Primary MD to go over all hospital tests and procedure/radiological results at the follow up, please ask your Primary MD to get all Hospital records sent to his/her office.   In some cases, they will be blood work, cultures and biopsy results pending at the time of your discharge. Please request that your primary care M.D. goes through all the records of your hospital data and follows up on these results.   Also Note the following: If you experience worsening of your admission symptoms, develop shortness of breath, life threatening emergency, suicidal or homicidal thoughts you must seek medical attention immediately by calling 911 or calling your MD immediately  if symptoms less severe.   You must read complete instructions/literature along with all the possible adverse reactions/side effects for all the Medicines you take and that have been prescribed to you. Take any new Medicines after you have completely understood and accpet all the possible adverse reactions/side effects.    Do not drive when taking Pain medications or sleeping medications (Benzodaizepines)   Do not take more than prescribed Pain, Sleep and Anxiety Medications. It is not advisable to combine anxiety,sleep and pain medications without talking with your primary care practitioner   Special Instructions: If you have smoked or chewed Tobacco  in the last 2 yrs please stop smoking, stop any regular Alcohol  and or any Recreational drug use.   Wear Seat belts while driving.   Please note: You were cared for by a hospitalist during your hospital stay. Once you are discharged, your primary care physician will handle any further medical issues. Please note that NO REFILLS for any discharge medications will be authorized once you are discharged, as it is imperative that you return to your primary care physician (or establish a relationship with a primary care physician if you do not  have one) for your post hospital discharge needs so that they can reassess your need for medications and monitor your lab values.  Time coordinating discharge: 40 minutes  SIGNED:  Marzetta Board, MD, PhD 07/19/2021, 3:33 PM

## 2021-07-19 NOTE — Discharge Instructions (Signed)
Follow with Wardell Honour, MD in 5-7 days  Please get a complete blood count and chemistry panel checked by your Primary MD at your next visit, and again as instructed by your Primary MD. Please get your medications reviewed and adjusted by your Primary MD.  Please request your Primary MD to go over all Hospital Tests and Procedure/Radiological results at the follow up, please get all Hospital records sent to your Prim MD by signing hospital release before you go home.  In some cases, there will be blood work, cultures and biopsy results pending at the time of your discharge. Please request that your primary care M.D. goes through all the records of your hospital data and follows up on these results.  If you had Pneumonia of Lung problems at the Hospital: Please get a 2 view Chest X ray done in 6-8 weeks after hospital discharge or sooner if instructed by your Primary MD.  If you have Congestive Heart Failure: Please call your Cardiologist or Primary MD anytime you have any of the following symptoms:  1) 3 pound weight gain in 24 hours or 5 pounds in 1 week  2) shortness of breath, with or without a dry hacking cough  3) swelling in the hands, feet or stomach  4) if you have to sleep on extra pillows at night in order to breathe  Follow cardiac low salt diet and 1.5 lit/day fluid restriction.  If you have diabetes Accuchecks 4 times/day, Once in AM empty stomach and then before each meal. Log in all results and show them to your primary doctor at your next visit. If any glucose reading is under 80 or above 300 call your primary MD immediately.  If you have Seizure/Convulsions/Epilepsy: Please do not drive, operate heavy machinery, participate in activities at heights or participate in high speed sports until you have seen by Primary MD or a Neurologist and advised to do so again. Per [facility] statutes, patients with seizures are not allowed to drive until they have been  seizure-free for six months.  Use caution when using heavy equipment or power tools. Avoid working on ladders or at heights. Take showers instead of baths. Ensure the water temperature is not too high on the home water heater. Do not go swimming alone. Do not lock yourself in a room alone (i.e. bathroom). When caring for infants or small children, sit down when holding, feeding, or changing them to minimize risk of injury to the child in the event you have a seizure. Maintain good sleep hygiene. Avoid alcohol.   If you had Gastrointestinal Bleeding: Please ask your Primary MD to check a complete blood count within one week of discharge or at your next visit. Your endoscopic/colonoscopic biopsies that are pending at the time of discharge, will also need to followed by your Primary MD.  Get Medicines reviewed and adjusted. Please take all your medications with you for your next visit with your Primary MD  Please request your Primary MD to go over all hospital tests and procedure/radiological results at the follow up, please ask your Primary MD to get all Hospital records sent to his/her office.  If you experience worsening of your admission symptoms, develop shortness of breath, life threatening emergency, suicidal or homicidal thoughts you must seek medical attention immediately by calling 911 or calling your MD immediately  if symptoms less severe.  You must read complete instructions/literature along with all the possible adverse reactions/side effects for all the Medicines you  take and that have been prescribed to you. Take any new Medicines after you have completely understood and accpet all the possible adverse reactions/side effects.   Do not drive or operate heavy machinery when taking Pain medications.   Do not take more than prescribed Pain, Sleep and Anxiety Medications  Special Instructions: If you have smoked or chewed Tobacco  in the last 2 yrs please stop smoking, stop any regular  Alcohol  and or any Recreational drug use.  Wear Seat belts while driving.  Please note You were cared for by a hospitalist during your hospital stay. If you have any questions about your discharge medications or the care you received while you were in the hospital after you are discharged, you can call the unit and asked to speak with the hospitalist on call if the hospitalist that took care of you is not available. Once you are discharged, your primary care physician will handle any further medical issues. Please note that NO REFILLS for any discharge medications will be authorized once you are discharged, as it is imperative that you return to your primary care physician (or establish a relationship with a primary care physician if you do not have one) for your aftercare needs so that they can reassess your need for medications and monitor your lab values.  You can reach the hospitalist office at phone [phone] or fax [phone]   If you do not have a primary care physician, you can call [phone] for a physician referral.  Activity: As tolerated with Full fall precautions use walker/cane & assistance as needed    Diet: heart healthy  Disposition Home

## 2021-07-21 ENCOUNTER — Telehealth: Payer: Self-pay | Admitting: *Deleted

## 2021-07-21 NOTE — Telephone Encounter (Signed)
Transition Care Management Unsuccessful Follow-up Telephone Call  Date of discharge and from where:  07/19/2021 [place]  Attempts:  1st Attempt  Reason for unsuccessful TCM follow-up call:  Left voice message to return call.

## 2021-07-22 NOTE — Telephone Encounter (Signed)
Transition Care Management Unsuccessful Follow-up Telephone Call  Date of discharge and from where:  07/19/2021 [place]  Attempts:  2nd Attempt  Reason for unsuccessful TCM follow-up call:  Left voice message to return call.

## 2021-07-22 NOTE — Telephone Encounter (Signed)
Transition Care Management Follow-up Telephone Call Date of discharge and from where: 07/19/2021 [place] How have you been since you were released from the hospital? Still weak and not driving Any questions or concerns? No  Items Reviewed: Did the pt receive and understand the discharge instructions provided? Yes  Medications obtained and verified? Yes  Other? No  Any new allergies since your discharge? No  Dietary orders reviewed? Yes Do you have support at home? Yes  [facility] and Equipment/Supplies: Were home health services ordered? no If so, what is the name of the agency? na  Has the agency set up a time to come to the patient's home? not applicable Were any new equipment or medical supplies ordered?  No What is the name of the medical supply agency? na Were you able to get the supplies/equipment? not applicable Do you have any questions related to the use of the equipment or supplies? No  Functional Questionnaire: (I = Independent and D = Dependent) ADLs: I  Bathing/Dressing- I  Meal Prep- I  Eating- I  Maintaining continence- I  Transferring/Ambulation- I  Managing Meds- I  Follow up appointments reviewed:  PCP Hospital f/u appt confirmed? Yes  Patient wants to call back to schedule an appointment when she is not so weak.  [place] [facility] f/u appt confirmed? No   Are transportation arrangements needed? No  If their condition worsens, is the pt aware to call PCP or go to the Emergency Dept.? Yes Was the patient provided with contact information for the PCP's office or ED? Yes Was to pt encouraged to call back with questions or concerns? Yes

## 2021-07-23 ENCOUNTER — Other Ambulatory Visit: Payer: Self-pay | Admitting: Cardiovascular Disease

## 2021-07-23 NOTE — Telephone Encounter (Signed)
Refill sent for 90 days of Atorvastatin -pt has appt next month with Dr Burt Knack

## 2021-07-24 ENCOUNTER — Telehealth: Payer: Self-pay | Admitting: *Deleted

## 2021-07-24 ENCOUNTER — Other Ambulatory Visit: Payer: Self-pay

## 2021-07-24 MED ORDER — LOSARTAN POTASSIUM-HCTZ 50-12.5 MG PO TABS: 1.0000 | ORAL_TABLET | Freq: Every day | ORAL | 0 refills | Status: DC

## 2021-07-24 NOTE — Telephone Encounter (Signed)
Pt's medication was sent to pt's pharmacy as requested. Confirmation received.  °

## 2021-07-24 NOTE — Telephone Encounter (Signed)
Patient called and left message on Clinical Intake stating that she wants a 90 day supply on her Losartan.   I reviewed Current Medication list and it looks like Cardiologist, Dr. Sherren Mocha refills this medication.   Called and [facility] for patient to call his office to request a 90 day supply Rx.

## 2021-07-28 ENCOUNTER — Other Ambulatory Visit: Payer: Self-pay

## 2021-07-28 MED ORDER — ISOSORBIDE MONONITRATE ER 30 MG PO TB24: 30.0000 mg | ORAL_TABLET | Freq: Every day | ORAL | 0 refills | Status: DC

## 2021-07-28 NOTE — Telephone Encounter (Signed)
Pt's medication was sent to pt's pharmacy as requested. Confirmation received.  °

## 2021-07-29 DIAGNOSIS — H40053 Ocular hypertension, bilateral: Secondary | ICD-10-CM | POA: Diagnosis not present

## 2021-07-29 DIAGNOSIS — Z961 Presence of intraocular lens: Secondary | ICD-10-CM | POA: Diagnosis not present

## 2021-07-29 DIAGNOSIS — H35372 Puckering of macula, left eye: Secondary | ICD-10-CM | POA: Diagnosis not present

## 2021-07-29 DIAGNOSIS — H18413 Arcus senilis, bilateral: Secondary | ICD-10-CM | POA: Diagnosis not present

## 2021-08-02 ENCOUNTER — Other Ambulatory Visit: Payer: Self-pay | Admitting: Nurse Practitioner

## 2021-08-03 ENCOUNTER — Other Ambulatory Visit: Payer: Self-pay | Admitting: Cardiovascular Disease

## 2021-08-06 ENCOUNTER — Telehealth: Payer: Self-pay

## 2021-08-06 NOTE — Telephone Encounter (Signed)
Incoming [facility] [place] requesting a refill on Glipizide 10 mg, take 1 by mouth in the morning and 1 by mouth in the evening to control blood sugar. ? ?The above instructions vary from what is on current medication list. ? ?Left message on voicemail for patient to return call when available. Reason for call was to clarify request. ? ?Awaiting return call from patient  ?

## 2021-08-07 NOTE — Telephone Encounter (Signed)
Message left on clinical intake voicemail:  ? ?[facility] that [facility] denied her Glipizide and stated we need to fill this medication for her. ? ? ?Left message on voicemail for patient with details about a clarification is needed prior to approving refill for glipizide. I asked that patient  return call when available. ? ?   ?

## 2021-08-08 NOTE — Telephone Encounter (Signed)
Patient returned call and stated she is taking glipizide er 10 mg 1 by mouth daily. Patient confirmed instructions off of her current pill bottle. ? ?Please advise  ? ? ?

## 2021-08-08 NOTE — Telephone Encounter (Signed)
The patient is saying she is taking medication twice daily however we have it documented that patient is taking once daily. ? ?That is what I need a clarification/directive on ? ? ?

## 2021-08-08 NOTE — Telephone Encounter (Signed)
May refill glipizide ER 10 mg 1 tablet by mouth daily for diabetes per patient request. ?

## 2021-08-11 NOTE — Telephone Encounter (Signed)
Glipizide usually dosed once daily unless she was advised to divide the dose for total of 20 mg tablet per day.will forward to Dr.Miller to verify.  ?

## 2021-08-12 MED ORDER — GLIPIZIDE ER 10 MG PO TB24: 10.0000 mg | ORAL_TABLET | Freq: Every day | ORAL | 1 refills | Status: DC

## 2021-08-12 NOTE — Telephone Encounter (Signed)
Below is Dr.Miller's response: ? ?Mckenzie Honour, MD  [name], [name], NP 1 hour ago (8:16 AM)  ? ?I recommend Glipizide once a day, especially since she already takes insulin   ? ?RX sent according to physicians instructions.  I called patient and left a detailed message with Dr.Miller's response and encouraged patient to call back to speak with clinical intake assistant if she has questions or concerns.  ?

## 2021-08-12 NOTE — Telephone Encounter (Signed)
Please see Dr.Miller's response. ?

## 2021-08-27 ENCOUNTER — Ambulatory Visit (INDEPENDENT_AMBULATORY_CARE_PROVIDER_SITE_OTHER): Payer: Medicare Other | Admitting: Cardiovascular Disease

## 2021-08-27 ENCOUNTER — Encounter: Payer: Self-pay | Admitting: Cardiovascular Disease

## 2021-08-27 ENCOUNTER — Other Ambulatory Visit: Payer: Self-pay

## 2021-08-27 VITALS — BP 145/58 | HR 51 | Ht 60.0 in | Wt 179.4 lb

## 2021-08-27 DIAGNOSIS — I2510 Atherosclerotic heart disease of native coronary artery without angina pectoris: Secondary | ICD-10-CM

## 2021-08-27 DIAGNOSIS — E782 Mixed hyperlipidemia: Secondary | ICD-10-CM | POA: Diagnosis not present

## 2021-08-27 DIAGNOSIS — I10 Essential (primary) hypertension: Secondary | ICD-10-CM

## 2021-08-27 NOTE — Progress Notes (Signed)
?Cardiology Office Note:   ? ?Date:  08/27/2021  ? ?ID:  [name], DOB 20-Apr-1941, MRN [id number] ? ?PCP:  Wardell Honour, MD ?  ?[facility] Providers ?Cardiologist:  Sherren Mocha, MD    ? ?Referring MD: Wardell Honour, MD  ? ?Chief Complaint  ?Patient presents with  ? Hypertension  ? ? ?History of Present Illness:   ? ?[name] is a 81 y.o. female with a hx of Takotsubo syndrome in 2016, nonobstructive CAD, hypertension, and mixed hyperlipidemia.  She was hospitalized in February 2023 with small bowel obstruction after presenting with severe abdominal pain and refractory nausea and vomiting.  She was noted to have elevated troponin and hypertensive urgency.Troponin elevation was suspected to be related to demand ischemia and no ischemic evaluation was pursued ? ?The patient is here alone today.  She is feeling well.  She denies any symptoms of chest pain, chest pressure, or shortness of breath.  She denies orthopnea, PND, heart palpitations, lightheadedness, or syncope.  She does have mild chronic leg swelling and wears compression socks.  She feels that she has had a good recovery from most recent hospitalization.  She never had any chest pain around the time of her hospital stay.  The patient is compliant with her medications. ? ?Past Medical History:  ?Diagnosis Date  ? Abnormality of gait   ? Cardiomegaly   ? Complication of anesthesia   ? hard time waking up  ? Depressive disorder, not elsewhere classified   ? Diabetic retinopathy ([place])   ? Extrinsic asthma, unspecified   ? Female stress incontinence   ? Gout, unspecified   ? High cholesterol   ? Lumbago   ? Memory loss   ? Migraine without aura, with intractable migraine, so stated, with status migrainosus   ? Obesity, unspecified   ? Palpitations   ? Reflux esophagitis   ? Type I (juvenile type) diabetes mellitus without mention of complication, uncontrolled   ? Unspecified essential hypertension   ? Unspecified hypothyroidism   ?  Unspecified vitamin D deficiency   ? Ventral hernia, unspecified, without mention of obstruction or gangrene   ? ? ?Past Surgical History:  ?Procedure Laterality Date  ? ABDOMINAL HYSTERECTOMY    ? APPENDECTOMY    ? BREAST SURGERY    ? EXPLORATORY LAPAROTOMY W/ BOWEL RESECTION  07/25/2004  ? PROCEDURE:  Laparoscopy, open laparotomy, resection of jejunojejunostomy  ? INCISIONAL HERNIA REPAIR  03/19/2006  ? PROCEDURE:  Open ventral hernia repair with mesh.  ? LAPAROSCOPIC GASTRIC BYPASS  07/22/2004  ? PROCEDURE:  Laparoscopic Roux-en-Y gastric bypass, antecolic, antegastric,  ? LAPAROSCOPIC INCISIONAL / UMBILICAL / VENTRAL HERNIA REPAIR  09/22/2005  ? PROCEDURE:  Laparoscopic ventral hernia repair with mesh.  ? LEFT HEART CATH AND CORONARY ANGIOGRAPHY N/A 11/20/2016  ? Procedure: Left Heart Cath and Coronary Angiography;  Surgeon: Sherren Mocha, MD;  Location: [place] [facility];  Service: Cardiovascular;  Laterality: N/A;  ? LEFT HEART CATHETERIZATION WITH CORONARY ANGIOGRAM N/A 08/21/2014  ? Procedure: LEFT HEART CATHETERIZATION WITH CORONARY ANGIOGRAM;  Surgeon: Sherren Mocha, MD;  Location: [facility] CATH [facility];  Service: Cardiovascular;  Laterality: N/A;  ? stents    ? last time she said was 1 yr ago   ? ? ?Current Medications: ?Current Meds  ?Medication Sig  ? amLODipine (NORVASC) 2.5 MG tablet Take one tablet by mouth once daily. (Patient taking differently: Take 2.5 mg by mouth daily.)  ? aspirin 81 MG tablet Take 81 mg  by mouth daily.  ? atorvastatin (LIPITOR) 20 MG tablet TAKE 1 TABLET BY MOUTH EVERY DAY  ? brimonidine (ALPHAGAN) 0.2 % ophthalmic solution Place 1 drop into both eyes 3 (three) times daily.  ? cetirizine (ZYRTEC) 10 MG tablet Take 10 mg by mouth daily as needed for allergies. For allergies  ? Cholecalciferol (VITAMIN D3) 5000 UNITS CAPS Take 5,000 Units by mouth daily. For supplement  ? citalopram (CELEXA) 40 MG tablet Take 1 tablet (40 mg total) by mouth daily.  ? Continuous Blood Gluc Receiver  (FREESTYLE LIBRE 2 READER) DEVI Use to test blood sugar three times daily.  Dx: E11.9  ? Continuous Blood Gluc Sensor (FREESTYLE LIBRE 2 SENSOR) MISC Use to test blood sugar three times daily. Dx: E11.9  ? Cyanocobalamin (VITAMIN B-12 PO) Take 1 tablet by mouth daily.  ? docusate sodium (COLACE) 100 MG capsule Take 100 mg by mouth daily.  ? ferrous sulfate 325 (65 FE) MG tablet Take 325 mg by mouth daily with breakfast.  ? furosemide (LASIX) 20 MG tablet Take 20 mg by mouth 2 (two) times daily as needed for fluid.  ? glipiZIDE (GLUCOTROL XL) 10 MG 24 hr tablet Take 1 tablet (10 mg total) by mouth daily with breakfast.  ? insulin NPH Human (HUMULIN N) 100 UNIT/ML injection INJECT 40 UNITS IN THE MORNING AND 40 UNITS IN THE EVENING (Patient taking differently: Inject 40 Units into the skin 2 (two) times daily before a meal.)  ? insulin regular (HUMULIN R) 100 units/mL injection INJECT 5 UNITS SUBCUTANEOUS AS NEEDED WHEN BLOOD SUGAR IS OVER 300. DX:E10.40  ? Insulin Syringes, Disposable, U-100 1 ML MISC Use to inject insulin Dx: P61.9509  ? isosorbide mononitrate (IMDUR) 30 MG 24 hr tablet Take 1 tablet (30 mg total) by mouth daily. Please keep upcoming appt in March 2023 with Dr. Burt Knack before anymore refills. Thank you Final Attempt  ? losartan-hydrochlorothiazide (HYZAAR) 50-12.5 MG tablet Take 1 tablet by mouth daily. Please keep upcoming appt in March 2023 with Dr. Burt Knack before anymore refills. Thank you Final Attempt  ? nitroGLYCERIN (NITROSTAT) 0.4 MG SL tablet Place 0.4 mg under the tongue every 5 (five) minutes as needed for chest pain.  ? omeprazole (PRILOSEC) 20 MG capsule Take 1 capsule (20 mg total) by mouth daily.  ? predniSONE (DELTASONE) 5 MG tablet TAKE 1 TABLET BY MOUTH EVERY DAY IN THE MORNING WITH FOOD TO REDUCE PAIN (Patient taking differently: Take 5 mg by mouth daily with breakfast.)  ?  ? ?Allergies:   Ciprofloxacin, Advair diskus [fluticasone-salmeterol], Albuterol, Alendronate sodium,  Benadryl [diphenhydramine hcl], Benzonatate, Cephalexin, Gabapentin, Iohexol, Keflex [cephalexin], Morphine, Other, Propoxyphene hcl, Reclast [zoledronic acid], Avapro [irbesartan], Codeine, Tessalon perles, and Tramadol  ? ?Social History  ? ?Socioeconomic History  ? Marital status: Widowed  ?  Spouse name: Not on file  ? Number of children: Not on file  ? Years of education: Not on file  ? Highest education level: Not on file  ?Occupational History  ? Not on file  ?Tobacco Use  ? Smoking status: Never  ? Smokeless tobacco: Never  ?Vaping Use  ? Vaping Use: Never used  ?Substance and Sexual Activity  ? Alcohol use: No  ? Drug use: No  ? Sexual activity: Not Currently  ?Other Topics Concern  ? Not on file  ?Social History Narrative  ? Not on file  ? ?Social Determinants of Health  ? ?Financial Resource Strain: Not on file  ?Food Insecurity: Not on file  ?  Transportation Needs: Not on file  ?Physical Activity: Not on file  ?Stress: Not on file  ?Social Connections: Not on file  ?  ? ?Family History: ?The patient's family history includes Cancer in her brother; Diabetes in her brother, father, and son; Heart disease in her father; Stroke in her father and mother. ? ?ROS:   ?Please see the history of present illness.    ?Positive for numbness in her feet, mild memory loss, weight loss. All other systems reviewed and are negative. ? ?EKGs/Labs/Other Studies Reviewed:   ? ?The following studies were reviewed today: ?Echo 07/18/2021: ? 1. Left ventricular ejection fraction, by estimation, is 55 to 60%. The  ?left ventricle has normal function. The left ventricle has no regional  ?wall motion abnormalities. There is mild left ventricular hypertrophy.  ?Left ventricular diastolic parameters  ?are consistent with Grade II diastolic dysfunction (pseudonormalization).  ? 2. Right ventricular systolic function is normal. The right ventricular  ?size is mildly enlarged. Tricuspid regurgitation signal is inadequate for  ?assessing  PA pressure.  ? 3. Right atrial size was mildly dilated.  ? 4. The mitral valve is normal in structure. No evidence of mitral valve  ?regurgitation. No evidence of mitral stenosis.  ? 5. The aortic valve is tricuspid. Aorti

## 2021-08-27 NOTE — Patient Instructions (Signed)
Medication Instructions:  ?Your physician recommends that you continue on your current medications as directed. Please refer to the Current Medication list given to you today. ? ?*If you need a refill on your cardiac medications before your next appointment, please call your pharmacy* ? ? ?Lab Work: ?NONE ?If you have labs (blood work) drawn today and your tests are completely normal, you will receive your results only by: ?MyChart Message (if you have MyChart) OR ?A paper copy in the mail ?If you have any lab test that is abnormal or we need to change your treatment, we will call you to review the results. ? ? ?Testing/Procedures: ?NONE ? ? ?Follow-Up: ?At [facility], you and your health needs are our priority.  As part of our continuing mission to provide you with exceptional heart care, we have created designated Provider Care Teams.  These Care Teams include your primary Cardiologist (physician) and Advanced Practice Providers (APPs -  Physician Assistants and Nurse Practitioners) who all work together to provide you with the care you need, when you need it. ? ?We recommend signing up for the patient portal called "MyChart".  Sign up information is provided on this After Visit Summary.  MyChart is used to connect with patients for Virtual Visits (Telemedicine).  Patients are able to view lab/test results, encounter notes, upcoming appointments, etc.  Non-urgent messages can be sent to your provider as well.   ?To learn more about what you can do with MyChart, go to [email or web address].   ? ?Your next appointment:   ?1 year(s) ? ?The format for your next appointment:   ?In Person ? ?Provider:   ?Michael Cooper, MD   ? ?  ?

## 2021-09-11 ENCOUNTER — Encounter: Payer: Self-pay | Admitting: *Deleted

## 2021-09-11 ENCOUNTER — Other Ambulatory Visit: Payer: Self-pay | Admitting: *Deleted

## 2021-09-11 DIAGNOSIS — G8929 Other chronic pain: Secondary | ICD-10-CM

## 2021-09-11 DIAGNOSIS — K219 Gastro-esophageal reflux disease without esophagitis: Secondary | ICD-10-CM

## 2021-09-11 NOTE — Telephone Encounter (Signed)
Called pharmacy and they stated that were not sure what refills patient needed. Stated that they have Eye Drops there from Dr. Nancy Fetter but it is too early to refill.  ? ?Awaiting callback from patient.  ?

## 2021-09-11 NOTE — Telephone Encounter (Signed)
Patient called and left message on Clinical intake stating that she needs her medications sent to [facility] [place], where she moved.  ? ?I tried calling patient back to confirm which medications she was needing, LMOM to return call.  ? ?Tried calling the pharmacy and was on hold over 15 minutes.  ? ?

## 2021-09-11 NOTE — Telephone Encounter (Signed)
LMOM to return call. Needing what refills she is requesting.  ?

## 2021-09-12 NOTE — Telephone Encounter (Signed)
LMOM to return call.

## 2021-09-15 MED ORDER — PREDNISONE 5 MG PO TABS: ORAL_TABLET | ORAL | 1 refills | Status: DC

## 2021-09-15 MED ORDER — CITALOPRAM HYDROBROMIDE 40 MG PO TABS: 40.0000 mg | ORAL_TABLET | Freq: Every day | ORAL | 1 refills | Status: DC

## 2021-09-15 MED ORDER — OMEPRAZOLE 20 MG PO CPDR: 20.0000 mg | DELAYED_RELEASE_CAPSULE | Freq: Every day | ORAL | 1 refills | Status: DC

## 2021-09-15 NOTE — Telephone Encounter (Signed)
Patient requested refills.  ?Pended Rx's and sent to [facility] for approval due to Dr. Sabra Heck out of office.  ?

## 2021-09-29 ENCOUNTER — Other Ambulatory Visit: Payer: Self-pay | Admitting: Family Medicine

## 2021-09-29 DIAGNOSIS — E1040 Type 1 diabetes mellitus with diabetic neuropathy, unspecified: Secondary | ICD-10-CM

## 2021-09-29 NOTE — Progress Notes (Signed)
Future labs requested ?

## 2021-10-16 ENCOUNTER — Other Ambulatory Visit: Payer: Medicare Other

## 2021-10-16 DIAGNOSIS — E1040 Type 1 diabetes mellitus with diabetic neuropathy, unspecified: Secondary | ICD-10-CM

## 2021-10-17 ENCOUNTER — Other Ambulatory Visit: Payer: Medicare Other

## 2021-10-17 DIAGNOSIS — E1040 Type 1 diabetes mellitus with diabetic neuropathy, unspecified: Secondary | ICD-10-CM | POA: Diagnosis not present

## 2021-10-18 LAB — HEMOGLOBIN A1C
Hgb A1c MFr Bld: 7.4 % of total Hgb — ABNORMAL HIGH (ref <5.7)
Mean Plasma Glucose: 166 mg/dL
eAG (mmol/L): 9.2 mmol/L

## 2021-10-21 ENCOUNTER — Encounter: Payer: Self-pay | Admitting: Family Medicine

## 2021-10-21 ENCOUNTER — Ambulatory Visit (INDEPENDENT_AMBULATORY_CARE_PROVIDER_SITE_OTHER): Payer: Medicare Other | Admitting: Family Medicine

## 2021-10-21 VITALS — BP 130/78 | HR 55 | Temp 96.8°F | Ht 60.0 in | Wt 183.4 lb

## 2021-10-21 DIAGNOSIS — Z7952 Long term (current) use of systemic steroids: Secondary | ICD-10-CM

## 2021-10-21 DIAGNOSIS — E1121 Type 2 diabetes mellitus with diabetic nephropathy: Secondary | ICD-10-CM | POA: Diagnosis not present

## 2021-10-21 DIAGNOSIS — I2510 Atherosclerotic heart disease of native coronary artery without angina pectoris: Secondary | ICD-10-CM

## 2021-10-21 DIAGNOSIS — M549 Dorsalgia, unspecified: Secondary | ICD-10-CM | POA: Diagnosis not present

## 2021-10-21 DIAGNOSIS — G894 Chronic pain syndrome: Secondary | ICD-10-CM | POA: Diagnosis not present

## 2021-10-21 DIAGNOSIS — R2681 Unsteadiness on feet: Secondary | ICD-10-CM

## 2021-10-21 NOTE — Progress Notes (Signed)
Provider:  Alain Honey, MD  Careteam: Patient Care Team: Wardell Honour, MD as PCP - General (Family Medicine) Sherren Mocha, MD as PCP - Cardiology (Cardiology) Excell Seltzer, MD (Inactive) as Consulting Physician (General Surgery) Tanda Rockers, MD as Consulting Physician (Pulmonary Disease) Ronald Lobo, MD as Consulting Physician (Gastroenterology) Jacolyn Reedy, MD as Consulting Physician (Cardiology) Hurman Horn, MD as Consulting Physician (Ophthalmology) Darleen Crocker, MD as Consulting Physician (Ophthalmology)  PLACE OF SERVICE:  [place]  Advanced Directive information    Allergies  Allergen Reactions   Ciprofloxacin Rash   Advair Diskus [Fluticasone-Salmeterol] Other (See Comments)    Throat closes   Albuterol     REACTION: closes throat   Alendronate Sodium Itching   Benadryl [Diphenhydramine Hcl] Itching   Benzonatate     REACTION: rash/hives   Cephalexin Nausea Only   Gabapentin Other (See Comments)    Loss of memory   Iohexol      Desc: rash and DIF BREATHING    Keflex [Cephalexin] Nausea And Vomiting   Morphine Nausea And Vomiting   Other Other (See Comments)    Decongestants- keeps pt awake at night, increased heart rate   Propoxyphene Hcl     Doesn't recall   Reclast [Zoledronic Acid] Other (See Comments)    Chest pain   Avapro [Irbesartan] Rash   Codeine Nausea And Vomiting, Swelling and Rash   Tessalon Perles Rash   Tramadol Nausea Only and Rash    Chief Complaint  Patient presents with   Medical Management of Chronic Issues    Patient presents today for a 4 month follow-up.   Quality Metric Gaps    Foot exam     HPI: Patient is a 81 y.o. female [place] recently moved to an [place] about 2 months ago and is adjusting to new life in retirement community.  Seems like she is doing well.  Her diabetes shows less control as A1c has increased from 6.5-7.4.  She blames this on my decreasing her glipizide from 20 mg  to 10 mg.  I explained that with that particular medicine there is a risk of low blood sugar and I would rather her sugar run a little high than too low and A1c of 7.4 is acceptable and someone her age.  She continues on glipizide 10 mg/day as well as Humulin insulin. Her lipids have been well controlled on atorvastatin 20 mg with LDL cholesterol of 52 when last checked 4 months ago. She complains of some memory loss but really is doing well for her age. She complains of increasing difficulty getting up out of a chair and requests lift chair for aid.  Review of Systems:  ROS8.  Review of Systems:  Review of Systems  Constitutional: Negative.   HENT:  Positive for hearing loss.   Eyes: Negative.   Respiratory: Negative.    Cardiovascular: Negative.   Genitourinary: Negative.   Musculoskeletal:  Negative for falls.  Skin: Negative.   Neurological: Negative.   Psychiatric/Behavioral: Negative.    All other systems reviewed and are negative.  Past Medical History:  Diagnosis Date   Abnormality of gait    Cardiomegaly    Complication of anesthesia    hard time waking up   Depressive disorder, not elsewhere classified    Diabetic retinopathy ([place])    Extrinsic asthma, unspecified    Female stress incontinence    Gout, unspecified    High cholesterol    Lumbago  Memory loss    Migraine without aura, with intractable migraine, so stated, with status migrainosus    Obesity, unspecified    Palpitations    Reflux esophagitis    Type I (juvenile type) diabetes mellitus without mention of complication, uncontrolled    Unspecified essential hypertension    Unspecified hypothyroidism    Unspecified vitamin D deficiency    Ventral hernia, unspecified, without mention of obstruction or gangrene    Past Surgical History:  Procedure Laterality Date   ABDOMINAL HYSTERECTOMY     APPENDECTOMY     BREAST SURGERY     EXPLORATORY LAPAROTOMY W/ BOWEL RESECTION  07/25/2004   PROCEDURE:   Laparoscopy, open laparotomy, resection of jejunojejunostomy   INCISIONAL HERNIA REPAIR  03/19/2006   PROCEDURE:  Open ventral hernia repair with mesh.   LAPAROSCOPIC GASTRIC BYPASS  07/22/2004   PROCEDURE:  Laparoscopic Roux-en-Y gastric bypass, antecolic, antegastric,   LAPAROSCOPIC INCISIONAL / UMBILICAL / VENTRAL HERNIA REPAIR  09/22/2005   PROCEDURE:  Laparoscopic ventral hernia repair with mesh.   LEFT HEART CATH AND CORONARY ANGIOGRAPHY N/A 11/20/2016   Procedure: Left Heart Cath and Coronary Angiography;  Surgeon: Sherren Mocha, MD;  Location: [facility];  Service: Cardiovascular;  Laterality: N/A;   LEFT HEART CATHETERIZATION WITH CORONARY ANGIOGRAM N/A 08/21/2014   Procedure: LEFT HEART CATHETERIZATION WITH CORONARY ANGIOGRAM;  Surgeon: Sherren Mocha, MD;  Location: [facility];  Service: Cardiovascular;  Laterality: N/A;   stents     last time she said was 1 yr ago    Social History:   reports that she has never smoked. She has never used smokeless tobacco. She reports that she does not drink alcohol and does not use drugs.  Family History  Problem Relation Age of Onset   Stroke Mother    Stroke Father    Diabetes Father    Heart disease Father    Diabetes Son    Cancer Brother        BLADDER   Diabetes Brother     Medications: Patient's Medications  New Prescriptions   No medications on file  Previous Medications   AMLODIPINE (NORVASC) 2.5 MG TABLET    Take one tablet by mouth once daily.   ASPIRIN 81 MG TABLET    Take 81 mg by mouth daily.   ATORVASTATIN (LIPITOR) 20 MG TABLET    TAKE 1 TABLET BY MOUTH EVERY DAY   BRIMONIDINE (ALPHAGAN) 0.2 % OPHTHALMIC SOLUTION    Place 1 drop into both eyes 3 (three) times daily.   CETIRIZINE (ZYRTEC) 10 MG TABLET    Take 10 mg by mouth daily as needed for allergies. For allergies   CHOLECALCIFEROL (VITAMIN D3) 5000 UNITS CAPS    Take 5,000 Units by mouth daily. For supplement   CITALOPRAM (CELEXA) 40 MG TABLET    Take 1  tablet (40 mg total) by mouth daily.   CONTINUOUS BLOOD GLUC RECEIVER (FREESTYLE LIBRE 2 READER) DEVI    Use to test blood sugar three times daily.  Dx: E11.9   CONTINUOUS BLOOD GLUC SENSOR (FREESTYLE LIBRE 2 SENSOR) MISC    Use to test blood sugar three times daily. Dx: E11.9   CYANOCOBALAMIN (VITAMIN B-12 PO)    Take 1 tablet by mouth daily.   DOCUSATE SODIUM (COLACE) 100 MG CAPSULE    Take 100 mg by mouth daily.   FERROUS SULFATE 325 (65 FE) MG TABLET    Take 325 mg by mouth daily with breakfast.   FUROSEMIDE (  LASIX) 20 MG TABLET    Take 20 mg by mouth 2 (two) times daily as needed for fluid.   GLIPIZIDE (GLUCOTROL XL) 10 MG 24 HR TABLET    Take 1 tablet (10 mg total) by mouth daily with breakfast.   INSULIN NPH HUMAN (HUMULIN N) 100 UNIT/ML INJECTION    INJECT 40 UNITS IN THE MORNING AND 40 UNITS IN THE EVENING   INSULIN REGULAR (HUMULIN R) 100 UNITS/ML INJECTION    INJECT 5 UNITS SUBCUTANEOUS AS NEEDED WHEN BLOOD SUGAR IS OVER 300. DX:E10.40   INSULIN SYRINGES, DISPOSABLE, U-100 1 ML MISC    Use to inject insulin Dx: A83.4196   ISOSORBIDE MONONITRATE (IMDUR) 30 MG 24 HR TABLET    Take 1 tablet (30 mg total) by mouth daily. Please keep upcoming appt in March 2023 with Dr. Burt Knack before anymore refills. Thank you Final Attempt   LOSARTAN-HYDROCHLOROTHIAZIDE (HYZAAR) 50-12.5 MG TABLET    Take 1 tablet by mouth daily. Please keep upcoming appt in March 2023 with Dr. Burt Knack before anymore refills. Thank you Final Attempt   NITROGLYCERIN (NITROSTAT) 0.4 MG SL TABLET    Place 0.4 mg under the tongue every 5 (five) minutes as needed for chest pain.   OMEPRAZOLE (PRILOSEC) 20 MG CAPSULE    Take 1 capsule (20 mg total) by mouth daily.   PREDNISONE (DELTASONE) 5 MG TABLET    Take one tablet by mouth once daily in the morning with food to reduce pain.  Modified Medications   No medications on file  Discontinued Medications   No medications on file    Physical Exam:  Vitals:   10/21/21 0857   Weight: 183 lb 6.4 oz (83.2 kg)  Height: 5' (1.524 m)   Body mass index is 35.82 kg/m. Wt Readings from Last 3 Encounters:  10/21/21 183 lb 6.4 oz (83.2 kg)  08/27/21 179 lb 6.4 oz (81.4 kg)  07/18/21 189 lb 9.5 oz (86 kg)    Physical Exam Vitals and nursing note reviewed.  Constitutional:      Appearance: She is obese.  Cardiovascular:     Rate and Rhythm: Normal rate and regular rhythm.  Pulmonary:     Effort: Pulmonary effort is normal.     Breath sounds: Normal breath sounds.  Abdominal:     Comments: Hernia present.  No change  Neurological:     General: No focal deficit present.     Mental Status: She is alert and oriented to person, place, and time.  Psychiatric:        Mood and Affect: Mood normal.        Behavior: Behavior normal.    Labs reviewed: Basic Metabolic Panel: Recent Labs    01/06/21 0816 01/08/21 0026 01/09/21 0034 06/12/21 0930 07/17/21 2118 07/19/21 0128  NA 140 140   < > 144 138 141  K 3.7 3.9   < > 4.3 3.6 3.8  CL 100 95*   < > 107 102 107  CO2 33* 34*   < > 32 26 27  GLUCOSE 175* 117*   < > 91 213* 154*  BUN 14 12   < > '11 15 20  '$ CREATININE 0.93 0.99   < > 0.67 0.84 0.86  CALCIUM 9.0 9.0   < > 8.9 9.2 8.0*  MG 1.6* 1.8  --   --   --   --   PHOS 4.0 4.0  --   --   --   --    < > =  values in this interval not displayed.   Liver Function Tests: Recent Labs    01/10/21 0135 05/07/21 1103 06/12/21 0930 07/17/21 2118  AST 19 34 16 24  ALT 24 33 25 57*  ALKPHOS 72 82  --  93  BILITOT 0.5 0.7 0.7 0.6  PROT 5.1* 5.9* 5.4* 6.4*  ALBUMIN 2.6* 3.1*  --  3.5   Recent Labs    01/02/21 1733 07/17/21 2118  LIPASE 27 27   No results for input(s): AMMONIA in the last 8760 hours. CBC: Recent Labs    01/10/21 0135 05/07/21 1103 06/12/21 0930 07/17/21 2118 07/19/21 0128  WBC 7.8 8.5 7.0 11.3* 7.5  NEUTROABS 4.5 5.4 4,305  --   --   HGB 11.8* 12.9 12.5 13.6 11.2*  HCT 37.1 41.6 39.1 44.2 36.2  MCV 93.7 91.8 90.5 94.8 94.3   PLT 267 314 294 304 202   Lipid Panel: Recent Labs    01/06/21 0816 06/12/21 0930  CHOL 175 118  HDL 45 46*  LDLCALC 73 52  TRIG 283* 115  CHOLHDL 3.9 2.6   TSH: No results for input(s): TSH in the last 8760 hours. A1C: Lab Results  Component Value Date   HGBA1C 7.4 (H) 10/17/2021     Assessment/Plan  1. Chronic pain syndrome Continues with prednisone for arthralgias per rheumatology  2. Back pain, unspecified back location, unspecified back pain laterality, unspecified chronicity As above continue with prednisone  3. Controlled diabetes mellitus with nephropathy ([place]) I have recommended continue glipizide XL 10 mg and insulin  4. Current chronic use of systemic steroids Per rheumatology  5. Unsteady gait Uses cane and walker various times.  Increasing difficulty getting up out of chair.  Have ordered lift chair for help   Alain Honey, MD [place] [phone]

## 2021-11-11 ENCOUNTER — Other Ambulatory Visit: Payer: Self-pay | Admitting: Cardiovascular Disease

## 2021-12-01 ENCOUNTER — Encounter: Payer: Self-pay | Admitting: Nurse Practitioner

## 2021-12-03 ENCOUNTER — Other Ambulatory Visit: Payer: Self-pay

## 2021-12-03 MED ORDER — INSULIN SYRINGES (DISPOSABLE) U-100 1 ML MISC: 3 refills | Status: DC

## 2021-12-04 ENCOUNTER — Ambulatory Visit (INDEPENDENT_AMBULATORY_CARE_PROVIDER_SITE_OTHER): Payer: Medicare Other | Admitting: Nurse Practitioner

## 2021-12-04 ENCOUNTER — Encounter: Payer: Self-pay | Admitting: Nurse Practitioner

## 2021-12-04 ENCOUNTER — Telehealth: Payer: Self-pay

## 2021-12-04 DIAGNOSIS — Z66 Do not resuscitate: Secondary | ICD-10-CM

## 2021-12-04 DIAGNOSIS — E2839 Other primary ovarian failure: Secondary | ICD-10-CM | POA: Diagnosis not present

## 2021-12-04 DIAGNOSIS — Z0000 Encounter for general adult medical examination without abnormal findings: Secondary | ICD-10-CM | POA: Diagnosis not present

## 2021-12-04 NOTE — Telephone Encounter (Signed)
Ms. iram, astorino are scheduled for a virtual visit with your provider today.    Just as we do with appointments in the office, we must obtain your consent to participate.  Your consent will be active for this visit and any virtual visit you may have with one of our providers in the next 365 days.    If you have a MyChart account, I can also send a copy of this consent to you electronically.  All virtual visits are billed to your insurance company just like a traditional visit in the office.  As this is a virtual visit, video technology does not allow for your provider to perform a traditional examination.  This may limit your provider's ability to fully assess your condition.  If your provider identifies any concerns that need to be evaluated in person or the need to arrange testing such as labs, EKG, etc, we will make arrangements to do so.    Although advances in technology are sophisticated, we cannot ensure that it will always work on either your end or our end.  If the connection with a video visit is poor, we may have to switch to a telephone visit.  With either a video or telephone visit, we are not always able to ensure that we have a secure connection.   I need to obtain your verbal consent now.   Are you willing to proceed with your visit today?   KYE SILVERSTEIN has provided verbal consent on 12/04/2021 for a virtual visit (video or telephone).   Leigh Aurora [place], [place] 12/04/2021  9:00 am

## 2021-12-04 NOTE — Progress Notes (Signed)
   This service is provided via telemedicine  No vital signs collected/recorded due to the encounter was a telemedicine visit.   Location of patient (ex: home, work):  Home  Patient consents to a telephone visit: Yes, see telephone visit dated 12/04/21  Location of the provider (ex: office, home):  [place], [place]   Name of any referring provider:  N/A  Names of all persons participating in the telemedicine service and their role in the encounter:  S.Chrae B/CMA, Sherrie Mustache, NP, and Patient   Time spent on call:  12 min with medical assistant

## 2021-12-04 NOTE — Progress Notes (Signed)
Subjective:   [name] is a 81 y.o. female who presents for Medicare Annual (Subsequent) preventive examination.  Review of Systems     Cardiac Risk Factors include: sedentary lifestyle;obesity (BMI >30kg/m2);advanced age (>62mn, >>69women);hypertension;diabetes mellitus;dyslipidemia     Objective:    There were no vitals filed for this visit. There is no height or weight on file to calculate BMI.     12/04/2021    8:07 AM 01/14/2021    8:22 AM 01/03/2021    3:00 PM 12/03/2020    9:15 AM 10/09/2020    9:36 AM 06/10/2020    9:46 AM 02/08/2020    9:47 AM  Advanced Directives  Does Patient Have a Medical Advance Directive? Yes Yes No Yes Yes Yes Yes  Type of Advance Directive Out of facility DNR (pink MOST or yellow form) Out of facility DNR (pink MOST or yellow form)  Out of facility DNR (pink MOST or yellow form) Out of facility DNR (pink MOST or yellow form) Out of facility DNR (pink MOST or yellow form) Out of facility DNR (pink MOST or yellow form)  Does patient want to make changes to medical advance directive? No - Patient declined No - Patient declined  No - Patient declined No - Patient declined No - Patient declined No - Patient declined  Would patient like information on creating a medical advance directive?   No - Patient declined      Pre-existing out of facility DNR order (yellow form or pink MOST form) Yellow form placed in chart (order not valid for inpatient use)   Yellow form placed in chart (order not valid for inpatient use)  Pink MOST/Yellow Form most recent copy in chart - Physician notified to receive inpatient order     Current Medications (verified) Outpatient Encounter Medications as of 12/04/2021  Medication Sig   amLODipine (NORVASC) 2.5 MG tablet Take one tablet by mouth once daily.   aspirin 81 MG tablet Take 81 mg by mouth daily.   atorvastatin (LIPITOR) 20 MG tablet TAKE 1 TABLET BY MOUTH EVERY DAY   brimonidine (ALPHAGAN) 0.2 % ophthalmic solution Place  1 drop into both eyes 3 (three) times daily.   cetirizine (ZYRTEC) 10 MG tablet Take 10 mg by mouth daily as needed for allergies. For allergies   Cholecalciferol (VITAMIN D3) 5000 UNITS CAPS Take 5,000 Units by mouth daily. For supplement   citalopram (CELEXA) 40 MG tablet Take 1 tablet (40 mg total) by mouth daily.   Continuous Blood Gluc Receiver (FREESTYLE LIBRE 2 READER) DEVI Use to test blood sugar three times daily.  Dx: E11.9   Continuous Blood Gluc Sensor (FREESTYLE LIBRE 2 SENSOR) MISC Use to test blood sugar three times daily. Dx: E11.9   Cyanocobalamin (VITAMIN B-12 PO) Take 1 tablet by mouth daily.   docusate sodium (COLACE) 100 MG capsule Take 100 mg by mouth daily.   ferrous sulfate 325 (65 FE) MG tablet Take 325 mg by mouth daily with breakfast.   furosemide (LASIX) 20 MG tablet Take 20 mg by mouth 2 (two) times daily as needed for fluid.   glipiZIDE (GLUCOTROL XL) 10 MG 24 hr tablet Take 1 tablet (10 mg total) by mouth daily with breakfast.   insulin NPH Human (HUMULIN N) 100 UNIT/ML injection INJECT 40 UNITS IN THE MORNING AND 40 UNITS IN THE EVENING   insulin regular (HUMULIN R) 100 units/mL injection INJECT 5 UNITS SUBCUTANEOUS AS NEEDED WHEN BLOOD SUGAR IS OVER 300. DX:E10.40  Insulin Syringes, Disposable, U-100 1 ML MISC Use to inject insulin Dx: X54.0086   isosorbide mononitrate (IMDUR) 30 MG 24 hr tablet Take 1 tablet (30 mg total) by mouth daily. Please keep upcoming appt in March 2023 with Dr. Burt Knack before anymore refills. Thank you Final Attempt   losartan-hydrochlorothiazide (HYZAAR) 50-12.5 MG tablet Take 1 tablet by mouth daily.   nitroGLYCERIN (NITROSTAT) 0.4 MG SL tablet Place 0.4 mg under the tongue every 5 (five) minutes as needed for chest pain.   omeprazole (PRILOSEC) 20 MG capsule Take 1 capsule (20 mg total) by mouth daily.   predniSONE (DELTASONE) 5 MG tablet Take one tablet by mouth once daily in the morning with food to reduce pain.   [DISCONTINUED]  Insulin Syringes, Disposable, U-100 1 ML MISC Use to inject insulin Dx: P61.9509   No facility-administered encounter medications on file as of 12/04/2021.    Allergies (verified) Ciprofloxacin, Advair diskus [fluticasone-salmeterol], Albuterol, Alendronate sodium, Benadryl [diphenhydramine hcl], Benzonatate, Cephalexin, Gabapentin, Iohexol, Keflex [cephalexin], Morphine, Other, Propoxyphene hcl, Reclast [zoledronic acid], Avapro [irbesartan], Codeine, Tessalon perles, and Tramadol   History: Past Medical History:  Diagnosis Date   Abnormality of gait    Cardiomegaly    Complication of anesthesia    hard time waking up   Depressive disorder, not elsewhere classified    Diabetic retinopathy ([place])    Extrinsic asthma, unspecified    Female stress incontinence    Gout, unspecified    High cholesterol    Lumbago    Memory loss    Migraine without aura, with intractable migraine, so stated, with status migrainosus    Obesity, unspecified    Palpitations    Reflux esophagitis    Type I (juvenile type) diabetes mellitus without mention of complication, uncontrolled    Unspecified essential hypertension    Unspecified hypothyroidism    Unspecified vitamin D deficiency    Ventral hernia, unspecified, without mention of obstruction or gangrene    Past Surgical History:  Procedure Laterality Date   ABDOMINAL HYSTERECTOMY     APPENDECTOMY     BREAST SURGERY     EXPLORATORY LAPAROTOMY W/ BOWEL RESECTION  07/25/2004   PROCEDURE:  Laparoscopy, open laparotomy, resection of jejunojejunostomy   INCISIONAL HERNIA REPAIR  03/19/2006   PROCEDURE:  Open ventral hernia repair with mesh.   LAPAROSCOPIC GASTRIC BYPASS  07/22/2004   PROCEDURE:  Laparoscopic Roux-en-Y gastric bypass, antecolic, antegastric,   LAPAROSCOPIC INCISIONAL / UMBILICAL / VENTRAL HERNIA REPAIR  09/22/2005   PROCEDURE:  Laparoscopic ventral hernia repair with mesh.   LEFT HEART CATH AND CORONARY ANGIOGRAPHY N/A 11/20/2016    Procedure: Left Heart Cath and Coronary Angiography;  Surgeon: Sherren Mocha, MD;  Location: [facility];  Service: Cardiovascular;  Laterality: N/A;   LEFT HEART CATHETERIZATION WITH CORONARY ANGIOGRAM N/A 08/21/2014   Procedure: LEFT HEART CATHETERIZATION WITH CORONARY ANGIOGRAM;  Surgeon: Sherren Mocha, MD;  Location: [facility];  Service: Cardiovascular;  Laterality: N/A;   stents     last time she said was 1 yr ago    Family History  Problem Relation Age of Onset   Stroke Mother    Stroke Father    Diabetes Father    Heart disease Father    Diabetes Son    Cancer Brother        BLADDER   Diabetes Brother    Social History   Socioeconomic History   Marital status: Widowed    Spouse name: Not on file   Number  of children: Not on file   Years of education: Not on file   Highest education level: Not on file  Occupational History   Not on file  Tobacco Use   Smoking status: Never   Smokeless tobacco: Never  Vaping Use   Vaping Use: Never used  Substance and Sexual Activity   Alcohol use: No   Drug use: No   Sexual activity: Not Currently  Other Topics Concern   Not on file  Social History Narrative   Not on file   Social Determinants of Health   Financial Resource Strain: Low Risk  (11/09/2017)   Overall Financial Resource Strain (CARDIA)    Difficulty of Paying Living Expenses: Not hard at all  Food Insecurity: No Food Insecurity (11/09/2017)   Hunger Vital Sign    Worried About Running Out of Food in the Last Year: Never true    Ran Out of Food in the Last Year: Never true  Transportation Needs: No Transportation Needs (11/09/2017)   PRAPARE - Hydrologist (Medical): No    Lack of Transportation (Non-Medical): No  Physical Activity: Insufficiently Active (11/09/2017)   Exercise Vital Sign    Days of Exercise per Week: 1 day    Minutes of Exercise per Session: 10 min  Stress: Stress Concern Present (11/09/2017)   [place]    Feeling of Stress : To some extent  Social Connections: Moderately Isolated (11/09/2017)   Social Connection and Isolation Panel [NHANES]    Frequency of Communication with Friends and Family: More than three times a week    Frequency of Social Gatherings with Friends and Family: More than three times a week    Attends Religious Services: Never    Marine scientist or Organizations: No    Attends Archivist Meetings: Never    Marital Status: Widowed    Tobacco Counseling Counseling given: Not Answered   Clinical Intake:  Pre-visit preparation completed: Yes  Pain : No/denies pain     BMI - recorded: 35 Nutritional Status: BMI > 30  Obese Nutritional Risks: None Diabetes: Yes  How often do you need to have someone help you when you read instructions, pamphlets, or other written materials from your doctor or pharmacy?: 1 - Never  Diabetic?yes         Activities of Daily Living    12/04/2021    9:59 AM 12/04/2021    9:58 AM  In your present state of health, do you have any difficulty performing the following activities:  Hearing? 1   Vision? 0   Difficulty concentrating or making decisions? 0   Walking or climbing stairs?  1  Comment  unable to do more than 4 steps  Dressing or bathing?  0  Doing errands, shopping?  0  Preparing Food and eating ? N   Using the Toilet?  N  In the past six months, have you accidently leaked urine? Y   Do you have problems with loss of bowel control? N   Managing your Medications? N   Managing your Finances? N   Housekeeping or managing your Housekeeping? N     Patient Care Team: Wardell Honour, MD as PCP - General (Family Medicine) Sherren Mocha, MD as PCP - Cardiology (Cardiology) Excell Seltzer, MD (Inactive) as Consulting Physician (General Surgery) Tanda Rockers, MD as Consulting Physician (Pulmonary Disease) Ronald Lobo, MD  as Consulting Physician (Gastroenterology)  Jacolyn Reedy, MD as Consulting Physician (Cardiology) Zadie Rhine Clent Demark, MD as Consulting Physician (Ophthalmology) Darleen Crocker, MD as Consulting Physician (Ophthalmology)  Indicate any recent Medical Services you may have received from other than [facility] providers in the past year (date may be approximate).     Assessment:   This is a routine wellness examination for [name].  Hearing/Vision screen Hearing Screening - Comments:: Hearing issues at present, recently got a new hearing aid for left ear. Wears bilateral hearing aids. Patient at [facility]:: Last eye exam less than one year ago. Eye doctors: Dr.Bevis and Dr.[facility]   Dietary issues and exercise activities discussed: Current Exercise Habits: The patient does not participate in regular exercise at present   Goals Addressed   None    Depression Screen    12/04/2021    8:06 AM 10/21/2021    9:05 AM 12/03/2020    9:12 AM 06/10/2020    9:55 AM 06/10/2020    9:46 AM 02/08/2020    9:47 AM 11/21/2019    8:51 AM  PHQ 2/9 Scores  PHQ - 2 Score 0 0 0 0 0 0 0    Fall Risk    12/04/2021    8:06 AM 10/21/2021    9:05 AM 06/17/2021    9:08 AM 02/12/2021    9:45 AM 01/14/2021    8:22 AM  Fall Risk   Falls in the past year? 1 0 0 0 0  Number falls in past yr: 0 0 0 0 0  Injury with Fall? 0 0 0 0 0  Risk for fall due to : No Fall Risks No Fall Risks No Fall Risks History of fall(s) No Fall Risks  Follow up Falls evaluation completed Falls evaluation completed Falls evaluation completed;Education provided;Falls prevention discussed Falls evaluation completed;Education provided;Falls prevention discussed Falls evaluation completed    FALL RISK PREVENTION PERTAINING TO THE HOME:  Any stairs in or around the home? No  If so, are there any without handrails?  na Home free of loose throw rugs in walkways, pet beds, electrical cords, etc? Yes  Adequate lighting in your  home to reduce risk of falls? Yes   ASSISTIVE DEVICES UTILIZED TO PREVENT FALLS:  Life alert? Yes  Use of a cane, walker or w/c? Yes  Grab bars in the bathroom? Yes  Shower chair or bench in shower? Yes  Elevated toilet seat or a handicapped toilet? Yes   TIMED UP AND GO:  Was the test performed? No .    Cognitive Function:    06/10/2020    9:56 AM 11/09/2017    8:47 AM 11/04/2016   10:27 AM  MMSE - Mini Mental State Exam  Orientation to time '5 5 5  '$ Orientation to Place '5 5 5  '$ Registration '3 3 3  '$ Attention/ Calculation '5 5 5  '$ Recall '3 3 2  '$ Language- name 2 objects '2 2 2  '$ Language- repeat '1 1 1  '$ Language- follow 3 step command '3 3 3  '$ Language- read & follow direction '1 1 1  '$ Write a sentence '1 1 1  '$ Copy design '1 1 1  '$ Total score '30 30 29        '$ 12/04/2021    9:10 AM 12/03/2020    9:16 AM 11/21/2019    8:55 AM 11/11/2018    9:43 AM  6CIT Screen  What Year? 0 points 0 points 0 points 0 points  What month? 0 points 0 points 0  points 0 points  What time? 0 points 0 points 0 points 0 points  Count back from 20 0 points 4 points 0 points 0 points  Months in reverse 0 points 0 points 0 points 0 points  Repeat phrase 0 points 0 points 0 points 0 points  Total Score 0 points 4 points 0 points 0 points    Immunizations Immunization History  Administered Date(s) Administered   DTaP 11/12/2011   Fluad Quad(high Dose 65+) 02/09/2019, 02/08/2020, 01/29/2021   Influenza Split 02/13/2009, 02/28/2014   Influenza, High Dose Seasonal PF 03/17/2017, 02/10/2018   Influenza,inj,Quad PF,6+ Mos 05/01/2015, 03/04/2016   Influenza-Unspecified 02/22/2013   Pneumococcal Conjugate-13 06/07/2013   Pneumococcal Polysaccharide-23 11/04/2016   Tdap 11/12/2011   Zoster Recombinat (Shingrix) 12/17/2017, 06/02/2018    TDAP status: Due, Education has been provided regarding the importance of this vaccine. Advised may receive this vaccine at local pharmacy or Health Dept. Aware to provide a copy of  the vaccination record if obtained from local pharmacy or Health Dept. Verbalized acceptance and understanding.  Flu Vaccine status: Up to date  Pneumococcal vaccine status: Up to date  Covid-19 vaccine status: Information provided on how to obtain vaccines.   Qualifies for Shingles Vaccine? Yes   Zostavax completed Yes   Shingrix Completed?: Yes  Screening Tests Health Maintenance  Topic Date Due   TETANUS/TDAP  11/11/2021   INFLUENZA VACCINE  12/30/2021   HEMOGLOBIN A1C  04/19/2022   OPHTHALMOLOGY EXAM  06/09/2022   FOOT EXAM  10/22/2022   Pneumonia Vaccine 59+ Years old  Completed   DEXA SCAN  Completed   Zoster Vaccines- Shingrix  Completed   HPV VACCINES  Aged Out   COVID-19 Vaccine  Discontinued    Health Maintenance  Health Maintenance Due  Topic Date Due   TETANUS/TDAP  11/11/2021    Colorectal cancer screening: No longer required.   Mammogram status: No longer required due to age.  Bone Density status: Ordered today. Pt provided with contact info and advised to call to schedule appt.  Lung Cancer Screening: (Low Dose CT Chest recommended if Age 52-80 years, 30 pack-year currently smoking OR have quit w/in 15years.) does not qualify.   Lung Cancer Screening Referral: na  Additional Screening:  Hepatitis C Screening: does not qualify; Completed na  Vision Screening: Recommended annual ophthalmology exams for early detection of glaucoma and other disorders of the eye. Is the patient up to date with their annual eye exam?  Yes  Who is the provider or what is the name of the office in which the patient attends annual eye exams? [facility] If pt is not established with a provider, would they like to be referred to a provider to establish care? No .   Dental Screening: Recommended annual dental exams for proper oral hygiene  Community Resource Referral / Chronic Care Management: CRR required this visit?  No   CCM required this visit?  No      Plan:     I  have personally reviewed and noted the following in the patient's chart:   Medical and social history Use of alcohol, tobacco or illicit drugs  Current medications and supplements including opioid prescriptions.  Functional ability and status Nutritional status Physical activity Advanced directives List of other physicians Hospitalizations, surgeries, and ER visits in previous 12 months Vitals Screenings to include cognitive, depression, and falls Referrals and appointments  In addition, I have reviewed and discussed with patient certain preventive protocols, quality metrics, and best practice  recommendations. A written personalized care plan for preventive services as well as general preventive health recommendations were provided to patient.     Lauree Chandler, NP   12/04/2021    Virtual Visit via Telephone Note  I connected with patient 12/04/21 at  9:00 AM EDT by telephone and verified that I am speaking with the correct person using two identifiers.  Location: Patient: home Provider: [facility]   I discussed the limitations, risks, security and privacy concerns of performing an evaluation and management service by telephone and the availability of in person appointments. I also discussed with the patient that there may be a patient responsible charge related to this service. The patient expressed understanding and agreed to proceed.   I discussed the assessment and treatment plan with the patient. The patient was provided an opportunity to ask questions and all were answered. The patient agreed with the plan and demonstrated an understanding of the instructions.   The patient was advised to call back or seek an in-person evaluation if the symptoms worsen or if the condition fails to improve as anticipated.  I provided 15 minutes of non-face-to-face time during this encounter.  Carlos American. Harle Battiest Avs printed and mailed

## 2021-12-04 NOTE — Patient Instructions (Signed)
[name] , Thank you for taking time to come for your Medicare Wellness Visit. I appreciate your ongoing commitment to your health goals. Please review the following plan we discussed and let me know if I can assist you in the future.   Screening recommendations/referrals: Colonoscopy aged out Mammogram aged out Bone Density ordered today Recommended yearly ophthalmology/optometry visit for glaucoma screening and checkup Recommended yearly dental visit for hygiene and checkup  Vaccinations: Influenza vaccine up to date Pneumococcal vaccine up to date Tdap vaccine DUE- recommend to get at your local pharmacy    Shingles vaccine up to date    Advanced directives: on file.   Conditions/risks identified: advance age, diabetes.  Next appointment: yearly    Preventive Care 20 Years and Older, Female Preventive care refers to lifestyle choices and visits with your health care provider that can promote health and wellness. What does preventive care include? A yearly physical exam. This is also called an annual well check. Dental exams once or twice a year. Routine eye exams. Ask your health care provider how often you should have your eyes checked. Personal lifestyle choices, including: Daily care of your teeth and gums. Regular physical activity. Eating a healthy diet. Avoiding tobacco and drug use. Limiting alcohol use. Practicing safe sex. Taking low-dose aspirin every day. Taking vitamin and mineral supplements as recommended by your health care provider. What happens during an annual well check? The services and screenings done by your health care provider during your annual well check will depend on your age, overall health, lifestyle risk factors, and family history of disease. Counseling  Your health care provider may ask you questions about your: Alcohol use. Tobacco use. Drug use. Emotional well-being. Home and relationship well-being. Sexual activity. Eating  habits. History of falls. Memory and ability to understand (cognition). Work and work Statistician. Reproductive health. Screening  You may have the following tests or measurements: Height, weight, and BMI. Blood pressure. Lipid and cholesterol levels. These may be checked every 5 years, or more frequently if you are over 24 years old. Skin check. Lung cancer screening. You may have this screening every year starting at age 74 if you have a 30-pack-year history of smoking and currently smoke or have quit within the past 15 years. Fecal occult blood test (FOBT) of the stool. You may have this test every year starting at age 85. Flexible sigmoidoscopy or colonoscopy. You may have a sigmoidoscopy every 5 years or a colonoscopy every 10 years starting at age 50. Hepatitis C blood test. Hepatitis B blood test. Sexually transmitted disease (STD) testing. Diabetes screening. This is done by checking your blood sugar (glucose) after you have not eaten for a while (fasting). You may have this done every 1-3 years. Bone density scan. This is done to screen for osteoporosis. You may have this done starting at age 63. Mammogram. This may be done every 1-2 years. Talk to your health care provider about how often you should have regular mammograms. Talk with your health care provider about your test results, treatment options, and if necessary, the need for more tests. Vaccines  Your health care provider may recommend certain vaccines, such as: Influenza vaccine. This is recommended every year. Tetanus, diphtheria, and acellular pertussis (Tdap, Td) vaccine. You may need a Td booster every 10 years. Zoster vaccine. You may need this after age 66. Pneumococcal 13-valent conjugate (PCV13) vaccine. One dose is recommended after age 32. Pneumococcal polysaccharide (PPSV23) vaccine. One dose is recommended after age  39. Talk to your health care provider about which screenings and vaccines you need and how  often you need them. This information is not intended to replace advice given to you by your health care provider. Make sure you discuss any questions you have with your health care provider. Document Released: 06/14/2015 Document Revised: 02/05/2016 Document Reviewed: 03/19/2015 Elsevier Interactive Patient Education  2017 [place] Prevention in the Home Falls can cause injuries. They can happen to people of all ages. There are many things you can do to make your home safe and to help prevent falls. What can I do on the outside of my home? Regularly fix the edges of walkways and driveways and fix any cracks. Remove anything that might make you trip as you walk through a door, such as a raised step or threshold. Trim any bushes or trees on the path to your home. Use bright outdoor lighting. Clear any walking paths of anything that might make someone trip, such as rocks or tools. Regularly check to see if handrails are loose or broken. Make sure that both sides of any steps have handrails. Any raised decks and porches should have guardrails on the edges. Have any leaves, snow, or ice cleared regularly. Use sand or salt on walking paths during winter. Clean up any spills in your garage right away. This includes oil or grease spills. What can I do in the bathroom? Use night lights. Install grab bars by the toilet and in the tub and shower. Do not use towel bars as grab bars. Use non-skid mats or decals in the tub or shower. If you need to sit down in the shower, use a plastic, non-slip stool. Keep the floor dry. Clean up any water that spills on the floor as soon as it happens. Remove soap buildup in the tub or shower regularly. Attach bath mats securely with double-sided non-slip rug tape. Do not have throw rugs and other things on the floor that can make you trip. What can I do in the bedroom? Use night lights. Make sure that you have a light by your bed that is easy to  reach. Do not use any sheets or blankets that are too big for your bed. They should not hang down onto the floor. Have a firm chair that has side arms. You can use this for support while you get dressed. Do not have throw rugs and other things on the floor that can make you trip. What can I do in the kitchen? Clean up any spills right away. Avoid walking on wet floors. Keep items that you use a lot in easy-to-reach places. If you need to reach something above you, use a strong step stool that has a grab bar. Keep electrical cords out of the way. Do not use floor polish or wax that makes floors slippery. If you must use wax, use non-skid floor wax. Do not have throw rugs and other things on the floor that can make you trip. What can I do with my stairs? Do not leave any items on the stairs. Make sure that there are handrails on both sides of the stairs and use them. Fix handrails that are broken or loose. Make sure that handrails are as long as the stairways. Check any carpeting to make sure that it is firmly attached to the stairs. Fix any carpet that is loose or worn. Avoid having throw rugs at the top or bottom of the stairs. If you do have  throw rugs, attach them to the floor with carpet tape. Make sure that you have a light switch at the top of the stairs and the bottom of the stairs. If you do not have them, ask someone to add them for you. What else can I do to help prevent falls? Wear shoes that: Do not have high heels. Have rubber bottoms. Are comfortable and fit you well. Are closed at the toe. Do not wear sandals. If you use a stepladder: Make sure that it is fully opened. Do not climb a closed stepladder. Make sure that both sides of the stepladder are locked into place. Ask someone to hold it for you, if possible. Clearly mark and make sure that you can see: Any grab bars or handrails. First and last steps. Where the edge of each step is. Use tools that help you move  around (mobility aids) if they are needed. These include: Canes. Walkers. Scooters. Crutches. Turn on the lights when you go into a dark area. Replace any light bulbs as soon as they burn out. Set up your furniture so you have a clear path. Avoid moving your furniture around. If any of your floors are uneven, fix them. If there are any pets around you, be aware of where they are. Review your medicines with your doctor. Some medicines can make you feel dizzy. This can increase your chance of falling. Ask your doctor what other things that you can do to help prevent falls. This information is not intended to replace advice given to you by your health care provider. Make sure you discuss any questions you have with your health care provider. Document Released: 03/14/2009 Document Revised: 10/24/2015 Document Reviewed: 06/22/2014 Elsevier Interactive Patient Education  2017 [employer].

## 2021-12-09 ENCOUNTER — Encounter: Payer: Medicare Other | Admitting: Nurse Practitioner

## 2021-12-26 ENCOUNTER — Other Ambulatory Visit: Payer: Self-pay | Admitting: *Deleted

## 2021-12-26 MED ORDER — INSULIN SYRINGES (DISPOSABLE) U-100 1 ML MISC: 3 refills | Status: DC

## 2021-12-26 NOTE — Telephone Encounter (Signed)
Patient called requesting insulin syringes. Stated that pharmacy did not receive the Rx that she requested last week.

## 2022-01-01 ENCOUNTER — Other Ambulatory Visit: Payer: Self-pay

## 2022-01-01 MED ORDER — INSULIN SYRINGES (DISPOSABLE) U-100 1 ML MISC
3 refills | Status: AC
Start: 2022-01-01 — End: ?

## 2022-01-03 ENCOUNTER — Other Ambulatory Visit: Payer: Self-pay | Admitting: Nurse Practitioner

## 2022-01-03 DIAGNOSIS — G8929 Other chronic pain: Secondary | ICD-10-CM

## 2022-01-05 NOTE — Telephone Encounter (Signed)
Patient has request refill on medication Prednisone '5mg'$ . Patient last refill dated 09/15/2021 with 90 tablets and 1 refill. Patient medication has allergy contraindications. Medication pend and sent to Sherrie Mustache, NP. Due to PCP Wardell Honour, MD not being in office .

## 2022-02-02 ENCOUNTER — Emergency Department (HOSPITAL_COMMUNITY): Payer: Medicare Other

## 2022-02-02 ENCOUNTER — Emergency Department (HOSPITAL_COMMUNITY)
Admission: EM | Admit: 2022-02-02 | Discharge: 2022-02-03 | Disposition: A | Discharge status: Home / Self Care | Payer: Medicare Other | Attending: Emergency Medicine | Admitting: Emergency Medicine

## 2022-02-02 DIAGNOSIS — I10 Essential (primary) hypertension: Secondary | ICD-10-CM | POA: Insufficient documentation

## 2022-02-02 DIAGNOSIS — W1839XA Other fall on same level, initial encounter: Secondary | ICD-10-CM | POA: Insufficient documentation

## 2022-02-02 DIAGNOSIS — Z7982 Long term (current) use of aspirin: Secondary | ICD-10-CM | POA: Diagnosis not present

## 2022-02-02 DIAGNOSIS — S0512XA Contusion of eyeball and orbital tissues, left eye, initial encounter: Secondary | ICD-10-CM | POA: Insufficient documentation

## 2022-02-02 DIAGNOSIS — S0990XA Unspecified injury of head, initial encounter: Secondary | ICD-10-CM | POA: Diagnosis not present

## 2022-02-02 DIAGNOSIS — S0003XA Contusion of scalp, initial encounter: Secondary | ICD-10-CM | POA: Diagnosis not present

## 2022-02-02 DIAGNOSIS — Z794 Long term (current) use of insulin: Secondary | ICD-10-CM | POA: Insufficient documentation

## 2022-02-02 DIAGNOSIS — S060X0A Concussion without loss of consciousness, initial encounter: Secondary | ICD-10-CM | POA: Diagnosis not present

## 2022-02-02 DIAGNOSIS — Z79899 Other long term (current) drug therapy: Secondary | ICD-10-CM | POA: Insufficient documentation

## 2022-02-02 DIAGNOSIS — J45909 Unspecified asthma, uncomplicated: Secondary | ICD-10-CM | POA: Insufficient documentation

## 2022-02-02 DIAGNOSIS — E039 Hypothyroidism, unspecified: Secondary | ICD-10-CM | POA: Diagnosis not present

## 2022-02-02 DIAGNOSIS — R42 Dizziness and giddiness: Secondary | ICD-10-CM | POA: Diagnosis not present

## 2022-02-02 DIAGNOSIS — R110 Nausea: Secondary | ICD-10-CM | POA: Diagnosis not present

## 2022-02-02 DIAGNOSIS — R410 Disorientation, unspecified: Secondary | ICD-10-CM | POA: Diagnosis not present

## 2022-02-02 DIAGNOSIS — E11319 Type 2 diabetes mellitus with unspecified diabetic retinopathy without macular edema: Secondary | ICD-10-CM | POA: Diagnosis not present

## 2022-02-02 DIAGNOSIS — S199XXA Unspecified injury of neck, initial encounter: Secondary | ICD-10-CM | POA: Diagnosis not present

## 2022-02-02 NOTE — ED Provider Triage Note (Signed)
Emergency Medicine Provider Triage Evaluation Note  [name] , a 81 y.o. female  was evaluated in triage.  Pt complains of head injury.  Reports mechanical fall last Tuesday, she has bruising around the left eye and maxilla.  Denies any vision loss but states she cannot move her left eye all the way laterally without diplopia.  She on a baby aspirin, not blood thinners...  Review of Systems  Per HPI  Physical Exam  There were no vitals taken for this visit. Gen:   Awake, no distress   Resp:  Normal effort  MSK:   Moves extremities without difficulty  Other:  Ecchymosis left side of face.  Intraocular movements are intact but with diplopia to the left eye with lateral gaze.  No significant C-spine tenderness, no malocclusion.  Missing teeth chronic.  Medical Decision Making  Medically screening exam initiated at 6:01 PM.  Appropriate orders placed.  [name] was informed that the remainder of the evaluation will be completed by another provider, this initial triage assessment does not replace that evaluation, and the importance of remaining in the ED until their evaluation is complete.     Sherrill Raring, PA-C 02/02/22 [id number]

## 2022-02-02 NOTE — ED Triage Notes (Signed)
Pt from independent living, c/o L eye pain after fall 1wk ago. Evaluated at facility, went to [facility] today concerned for confusion, bruising to L eye/cheek, bruising to L forearm, dizziness, nausea, given zofran at [facility]. Sent from [facility] for CT. States head feels "strange, that just started today."

## 2022-02-03 NOTE — Discharge Instructions (Addendum)
You were seen today for mild headache and concentration issues.  You may have a mild concussion related to your fall last week.  Your imaging is reassuring.  There is no brain bleed or fracture.  Make sure that you are getting plenty of rest.  You may follow-up in concussion clinic with Dr. Tamala Julian if you have continued symptoms.  Regarding your double vision, follow-up with ophthalmology.  This may just be related to soft tissue swelling.  However, you should have a formal eye exam.

## 2022-02-03 NOTE — ED Provider Notes (Signed)
[name] Provider Note   CSN: [id number] Arrival date & time: 02/02/22  1741     History  Chief Complaint  Patient presents with   Facial Pain    [name] is a 81 y.o. female.  HPI     This is an 81 year old female who presents with concerns for mild headache and concentration issues.  Patient reports mechanical fall last Tuesday.  She developed bruising around the left eye.  She is not on any blood thinners.  Denies vision loss or vision changes.  Reports some double vision with lateral gaze to the left.  Patient reports over the last 24 hours she has had a mild headache and has had increasing issues with concentration.  She has not had any nausea or vomiting.  Denies dizziness.  She was seen in urgent care and was referred here for further evaluation.  Denies chest pain, shortness of breath, recent illness, infection.  Home Medications Prior to Admission medications   Medication Sig Start Date End Date Taking? Authorizing Provider  amLODipine (NORVASC) 2.5 MG tablet Take one tablet by mouth once daily. 06/23/21   Lauree Chandler, NP  aspirin 81 MG tablet Take 81 mg by mouth daily.    [provider]  atorvastatin (LIPITOR) 20 MG tablet TAKE 1 TABLET BY MOUTH EVERY DAY 11/12/21   Sherren Mocha, MD  brimonidine [facility]) 0.2 % ophthalmic solution Place 1 drop into both eyes 3 (three) times daily. 05/16/21   [provider]  cetirizine (ZYRTEC) 10 MG tablet Take 10 mg by mouth daily as needed for allergies. For allergies    [provider]  Cholecalciferol (VITAMIN D3) 5000 UNITS CAPS Take 5,000 Units by mouth daily. For supplement    [provider]  citalopram (CELEXA) 40 MG tablet Take 1 tablet (40 mg total) by mouth daily. 09/15/21   Lauree Chandler, NP  Continuous Blood Gluc Receiver (FREESTYLE LIBRE 2 READER) DEVI Use to test blood sugar three times daily.  Dx: E11.9 06/24/21   Wardell Honour, MD  Continuous Blood Gluc Sensor (FREESTYLE LIBRE 2 SENSOR) MISC Use to test blood sugar three times daily. Dx: E11.9 06/24/21   Wardell Honour, MD  Cyanocobalamin (VITAMIN B-12 PO) Take 1 tablet by mouth daily.    [provider]  docusate sodium (COLACE) 100 MG capsule Take 100 mg by mouth daily.    [provider]  ferrous sulfate 325 (65 FE) MG tablet Take 325 mg by mouth daily with breakfast.    [provider]  furosemide (LASIX) 20 MG tablet Take 20 mg by mouth 2 (two) times daily as needed for fluid.    [provider]  glipiZIDE (GLUCOTROL XL) 10 MG 24 hr tablet Take 1 tablet (10 mg total) by mouth daily with breakfast. 08/12/21   Wardell Honour, MD  insulin NPH Human (HUMULIN N) 100 UNIT/ML injection INJECT 40 UNITS IN THE MORNING AND 40 UNITS IN THE EVENING 05/06/21   Wardell Honour, MD  insulin regular (HUMULIN R) 100 units/mL injection INJECT 5 UNITS SUBCUTANEOUS AS NEEDED WHEN BLOOD SUGAR IS OVER 300. DX:E10.40 08/04/21   Wardell Honour, MD  Insulin Syringes, Disposable, U-100 1 ML MISC Use to inject insulin Dx: A07.6226 01/01/22   Wardell Honour, MD  isosorbide mononitrate (IMDUR) 30 MG 24 hr tablet Take 1 tablet (30 mg total) by mouth daily. Please keep upcoming appt in March 2023 with Dr. Burt Knack  before anymore refills. Thank you Final Attempt 07/28/21   Sherren Mocha, MD  losartan-hydrochlorothiazide [facility]) 50-12.5 MG tablet Take 1 tablet by mouth daily. 11/12/21   Sherren Mocha, MD  nitroGLYCERIN (NITROSTAT) 0.4 MG SL tablet Place 0.4 mg under the tongue every 5 (five) minutes as needed for chest pain.    [provider]  omeprazole (PRILOSEC) 20 MG capsule Take 1 capsule (20 mg total) by mouth daily. 09/15/21   Lauree Chandler, NP  predniSONE (DELTASONE) 5 MG tablet TAKE ONE TABLET BY MOUTH ONCE DAILY IN THE MORNING WITH FOOD TO REDUCE PAIN. 01/05/22   Lauree Chandler, NP      Allergies    Ciprofloxacin, Advair diskus  [fluticasone-salmeterol], Albuterol, Alendronate sodium, Benadryl [diphenhydramine hcl], Benzonatate, Cephalexin, Gabapentin, Iohexol, Keflex [cephalexin], Morphine, Other, Propoxyphene hcl, Reclast [zoledronic acid], Avapro [irbesartan], Codeine, Tessalon perles, and Tramadol    Review of Systems   Review of Systems  Constitutional:  Negative for fever.  Neurological:  Positive for headaches. Negative for dizziness.  Psychiatric/Behavioral:  Positive for decreased concentration.   All other systems reviewed and are negative.   Physical Exam Updated Vital Signs BP (!) 150/55 (BP Location: Left Wrist)   Pulse (!) 51   Temp 98.7 F (37.1 C) (Oral)   Resp 16   SpO2 95%  Physical Exam Vitals and nursing note reviewed.  Constitutional:      Appearance: She is well-developed. She is not ill-appearing.  HENT:     Head: Normocephalic.     Comments: Ecchymosis around the left orbit with slight swelling    Nose: Nose normal.  Eyes:     Pupils: Pupils are equal, round, and reactive to light.     Comments: Pupils 3 mm reactive bilaterally, extraocular movements intact  Cardiovascular:     Rate and Rhythm: Normal rate and regular rhythm.     Heart sounds: Normal heart sounds.  Pulmonary:     Effort: Pulmonary effort is normal. No respiratory distress.  Abdominal:     Palpations: Abdomen is soft.     Tenderness: There is no abdominal tenderness.  Musculoskeletal:     Cervical back: Neck supple.  Skin:    General: Skin is warm and dry.  Neurological:     Mental Status: She is alert and oriented to person, place, and time.     Comments: Oriented x3, patient has fluent speech, can name and repeat, 5 out of 5 strength in all 4 extremities, no dysmetria to finger-nose-finger  Psychiatric:        Mood and Affect: Mood normal.     ED Results / Procedures / Treatments   Labs (all labs ordered are listed, but only abnormal results are displayed) Labs Reviewed - No data to  display  EKG None  Radiology CT Maxillofacial Wo Contrast  Result Date: 02/02/2022 CLINICAL DATA:  Head trauma, moderate-severe; Neck trauma (Age >= 65y); Facial trauma, blunt EXAM: CT HEAD WITHOUT CONTRAST CT MAXILLOFACIAL WITHOUT CONTRAST CT CERVICAL SPINE WITHOUT CONTRAST TECHNIQUE: Multidetector CT imaging of the head, cervical spine, and maxillofacial structures were performed using the standard protocol without intravenous contrast. Multiplanar CT image reconstructions of the cervical spine and maxillofacial structures were also generated. RADIATION DOSE REDUCTION: This exam was performed according to the departmental dose-optimization program which includes automated exposure control, adjustment of the mA and/or kV according to patient size and/or use of iterative reconstruction technique. COMPARISON:  CT head 05/07/2021 FINDINGS: CT HEAD FINDINGS Brain: No evidence of large-territorial acute infarction.  No parenchymal hemorrhage. No mass lesion. No extra-axial collection. No mass effect or midline shift. No hydrocephalus. Basilar cisterns are patent. Vascular: No hyperdense vessel. Atherosclerotic calcifications are present within the cavernous internal carotid and vertebral arteries. Skull: No acute fracture or focal lesion. Other: None. CT MAXILLOFACIAL FINDINGS Osseous: No fracture or mandibular dislocation. No destructive process. Left temporomandibular joint degenerative changes. Sinuses/Orbits: Bilateral, right greater than left, sphenoid sinus mucosal thickening. Otherwise paranasal sinuses and mastoid air cells are clear. Bilateral lens replacement. Otherwise the orbits are unremarkable. Soft tissues: Mild left frontal scalp subcutaneus soft tissue hematoma formation (6:123). CT CERVICAL SPINE FINDINGS Alignment: Normal. Skull base and vertebrae: No acute fracture. No aggressive appearing focal osseous lesion or focal pathologic process. Soft tissues and spinal canal: No prevertebral fluid or  swelling. No visible canal hematoma. Upper chest: Unremarkable. Other: Short-segment retropharyngeal right internal carotid artery within the neck. IMPRESSION: 1. No acute intracranial abnormality. 2. No acute displaced facial fracture. 3. No acute displaced fracture or traumatic listhesis of the cervical spine. Electronically Signed   By: Iven Finn M.D.   On: 02/02/2022 19:29   CT Head Wo Contrast  Result Date: 02/02/2022 CLINICAL DATA:  Head trauma, moderate-severe; Neck trauma (Age >= 65y); Facial trauma, blunt EXAM: CT HEAD WITHOUT CONTRAST CT MAXILLOFACIAL WITHOUT CONTRAST CT CERVICAL SPINE WITHOUT CONTRAST TECHNIQUE: Multidetector CT imaging of the head, cervical spine, and maxillofacial structures were performed using the standard protocol without intravenous contrast. Multiplanar CT image reconstructions of the cervical spine and maxillofacial structures were also generated. RADIATION DOSE REDUCTION: This exam was performed according to the departmental dose-optimization program which includes automated exposure control, adjustment of the mA and/or kV according to patient size and/or use of iterative reconstruction technique. COMPARISON:  CT head 05/07/2021 FINDINGS: CT HEAD FINDINGS Brain: No evidence of large-territorial acute infarction. No parenchymal hemorrhage. No mass lesion. No extra-axial collection. No mass effect or midline shift. No hydrocephalus. Basilar cisterns are patent. Vascular: No hyperdense vessel. Atherosclerotic calcifications are present within the cavernous internal carotid and vertebral arteries. Skull: No acute fracture or focal lesion. Other: None. CT MAXILLOFACIAL FINDINGS Osseous: No fracture or mandibular dislocation. No destructive process. Left temporomandibular joint degenerative changes. Sinuses/Orbits: Bilateral, right greater than left, sphenoid sinus mucosal thickening. Otherwise paranasal sinuses and mastoid air cells are clear. Bilateral lens replacement.  Otherwise the orbits are unremarkable. Soft tissues: Mild left frontal scalp subcutaneus soft tissue hematoma formation (6:123). CT CERVICAL SPINE FINDINGS Alignment: Normal. Skull base and vertebrae: No acute fracture. No aggressive appearing focal osseous lesion or focal pathologic process. Soft tissues and spinal canal: No prevertebral fluid or swelling. No visible canal hematoma. Upper chest: Unremarkable. Other: Short-segment retropharyngeal right internal carotid artery within the neck. IMPRESSION: 1. No acute intracranial abnormality. 2. No acute displaced facial fracture. 3. No acute displaced fracture or traumatic listhesis of the cervical spine. Electronically Signed   By: Iven Finn M.D.   On: 02/02/2022 19:29   CT Cervical Spine Wo Contrast  Result Date: 02/02/2022 CLINICAL DATA:  Head trauma, moderate-severe; Neck trauma (Age >= 65y); Facial trauma, blunt EXAM: CT HEAD WITHOUT CONTRAST CT MAXILLOFACIAL WITHOUT CONTRAST CT CERVICAL SPINE WITHOUT CONTRAST TECHNIQUE: Multidetector CT imaging of the head, cervical spine, and maxillofacial structures were performed using the standard protocol without intravenous contrast. Multiplanar CT image reconstructions of the cervical spine and maxillofacial structures were also generated. RADIATION DOSE REDUCTION: This exam was performed according to the departmental dose-optimization program which includes automated exposure control, adjustment of the  mA and/or kV according to patient size and/or use of iterative reconstruction technique. COMPARISON:  CT head 05/07/2021 FINDINGS: CT HEAD FINDINGS Brain: No evidence of large-territorial acute infarction. No parenchymal hemorrhage. No mass lesion. No extra-axial collection. No mass effect or midline shift. No hydrocephalus. Basilar cisterns are patent. Vascular: No hyperdense vessel. Atherosclerotic calcifications are present within the cavernous internal carotid and vertebral arteries. Skull: No acute fracture  or focal lesion. Other: None. CT MAXILLOFACIAL FINDINGS Osseous: No fracture or mandibular dislocation. No destructive process. Left temporomandibular joint degenerative changes. Sinuses/Orbits: Bilateral, right greater than left, sphenoid sinus mucosal thickening. Otherwise paranasal sinuses and mastoid air cells are clear. Bilateral lens replacement. Otherwise the orbits are unremarkable. Soft tissues: Mild left frontal scalp subcutaneus soft tissue hematoma formation (6:123). CT CERVICAL SPINE FINDINGS Alignment: Normal. Skull base and vertebrae: No acute fracture. No aggressive appearing focal osseous lesion or focal pathologic process. Soft tissues and spinal canal: No prevertebral fluid or swelling. No visible canal hematoma. Upper chest: Unremarkable. Other: Short-segment retropharyngeal right internal carotid artery within the neck. IMPRESSION: 1. No acute intracranial abnormality. 2. No acute displaced facial fracture. 3. No acute displaced fracture or traumatic listhesis of the cervical spine. Electronically Signed   By: Iven Finn M.D.   On: 02/02/2022 19:29    Procedures Procedures    Medications Ordered in ED Medications - No data to display  ED Course/ Medical Decision Making/ A&P                           Medical Decision Making  This patient presents to the ED for concern of headache, decreased concentration, this involves an extensive number of treatment options, and is a complaint that carries with it a high risk of complications and morbidity.  I considered the following differential and admission for this acute, potentially life threatening condition.  The differential diagnosis includes moderate injury injury, intracranial bleed, concussion, orbital fracture  MDM:    This is an 81 year old female who presents following a fall.  Patient fell approximately 1 week ago.  Has developed some headache and concentration issues.  She is awake, alert, oriented.  She provides  history.  She is overall nontoxic-appearing.  She does appear to have healing ecchymosis over the left orbit.  She is only on aspirin.  She is neurologically intact.  Symptoms are most suggestive of mild concussion.  CT head, face, neck obtained.  These are all reassuring without intracranial bleed or facial fracture.  Regarding her vision changes, she has some double vision with lateral gaze.  Her extraocular movements and.  Intact.  No entrapment noted.  Could be related to swelling from contusion to the orbit.  We will have her follow-up with ophthalmology.  We will also give her concussion precautions and have her follow-up in concussion clinic.  (Labs, imaging, consults)  Labs: I Ordered, and personally interpreted labs.  The pertinent results include: None  Imaging Studies ordered: I ordered imaging studies including CT head, face, neck I independently visualized and interpreted imaging. I agree with the radiologist interpretation  Additional history obtained from chart review.  External records from outside source obtained and reviewed including prior evaluations  Cardiac Monitoring: The patient was maintained on a cardiac monitor.  I personally viewed and interpreted the cardiac monitored which showed an underlying rhythm of: Sinus rhythm  Reevaluation: After the interventions noted above, I reevaluated the patient and found that they have :improved  Social Determinants  of Health: Lives independently  Disposition: Discharge  Co morbidities that complicate the patient evaluation  Past Medical History:  Diagnosis Date   Abnormality of gait    Cardiomegaly    Complication of anesthesia    hard time waking up   Depressive disorder, not elsewhere classified    Diabetic retinopathy ([place])    Extrinsic asthma, unspecified    Female stress incontinence    Gout, unspecified    High cholesterol    Lumbago    Memory loss    Migraine without aura, with intractable migraine, so  stated, with status migrainosus    Obesity, unspecified    Palpitations    Reflux esophagitis    Type I (juvenile type) diabetes mellitus without mention of complication, uncontrolled    Unspecified essential hypertension    Unspecified hypothyroidism    Unspecified vitamin D deficiency    Ventral hernia, unspecified, without mention of obstruction or gangrene      Medicines No orders of the defined types were placed in this encounter.   I have reviewed the patients home medicines and have made adjustments as needed  Problem List / ED Course: Problem List Items Addressed This Visit   None Visit Diagnoses     Concussion without loss of consciousness, initial encounter    -  Primary   Contusion of left orbital tissues, initial encounter                       Final Clinical Impression(s) / ED Diagnoses Final diagnoses:  Concussion without loss of consciousness, initial encounter  Contusion of left orbital tissues, initial encounter    Rx / DC Orders ED Discharge Orders     None         Marwin Primmer, Barbette Hair, MD 02/03/22 0050

## 2022-02-03 NOTE — ED Notes (Signed)
DC papers reviewed. No questions or concerns. No signs of distress. Pt assisted to wheelchair and out to lobby. Appropriate measures for safety taken. 

## 2022-02-10 ENCOUNTER — Ambulatory Visit: Payer: Medicare Other | Admitting: Family Medicine

## 2022-02-10 ENCOUNTER — Encounter (INDEPENDENT_AMBULATORY_CARE_PROVIDER_SITE_OTHER): Payer: Self-pay

## 2022-02-10 DIAGNOSIS — H16223 Keratoconjunctivitis sicca, not specified as Sjogren's, bilateral: Secondary | ICD-10-CM | POA: Diagnosis not present

## 2022-02-10 DIAGNOSIS — H40053 Ocular hypertension, bilateral: Secondary | ICD-10-CM | POA: Diagnosis not present

## 2022-02-10 DIAGNOSIS — Z961 Presence of intraocular lens: Secondary | ICD-10-CM | POA: Diagnosis not present

## 2022-02-10 DIAGNOSIS — H18413 Arcus senilis, bilateral: Secondary | ICD-10-CM | POA: Diagnosis not present

## 2022-02-11 ENCOUNTER — Other Ambulatory Visit: Payer: Self-pay | Admitting: Family Medicine

## 2022-02-11 ENCOUNTER — Telehealth: Payer: Self-pay

## 2022-02-11 DIAGNOSIS — H4922 Sixth [abducent] nerve palsy, left eye: Secondary | ICD-10-CM

## 2022-02-11 NOTE — Telephone Encounter (Signed)
Per Dr.Miller   View All Conversations on this Encounter Mckenzie Honour, MD  You 1 minute ago (4:31 PM)    Referral made

## 2022-02-11 NOTE — Telephone Encounter (Signed)
Estill Bamberg from [facility] called and states that this patient had recent fall and was seen in the ER. She states that patient has sixth nerve palsy now. Dr.Timothy Bevis wanted PCP Wardell Honour, MD to be informed incase PCP wanted to have post neuro consult. Patient recent visit from [facility] is being faxed over to [facility] office as well. Message routed to PCP Wardell Honour, MDa s Jul[facility] Rainier.

## 2022-02-11 NOTE — Telephone Encounter (Signed)
Noted,  Thank you!

## 2022-02-18 DIAGNOSIS — Z23 Encounter for immunization: Secondary | ICD-10-CM | POA: Diagnosis not present

## 2022-03-02 ENCOUNTER — Encounter (INDEPENDENT_AMBULATORY_CARE_PROVIDER_SITE_OTHER): Payer: Medicare Other | Admitting: Ophthalmology

## 2022-03-03 ENCOUNTER — Telehealth: Payer: Self-pay

## 2022-03-03 NOTE — Telephone Encounter (Signed)
Incoming fax received from [facility] requesting completion of order form for [employer] 2 sensor and monitor, along with recent OV notes.  Call placed to patient to confirm that she has requested supplies from this company as they were last sent to a local pharmacy, no answer, so I left a detailed message requesting a return call.   Form placed on my desk, awaiting for return call.

## 2022-03-09 ENCOUNTER — Encounter (INDEPENDENT_AMBULATORY_CARE_PROVIDER_SITE_OTHER): Payer: Medicare Other | Admitting: Ophthalmology

## 2022-03-19 DIAGNOSIS — K559 Vascular disorder of intestine, unspecified: Secondary | ICD-10-CM | POA: Diagnosis not present

## 2022-03-19 DIAGNOSIS — K8020 Calculus of gallbladder without cholecystitis without obstruction: Secondary | ICD-10-CM | POA: Diagnosis not present

## 2022-03-19 DIAGNOSIS — R9431 Abnormal electrocardiogram [ECG] [EKG]: Secondary | ICD-10-CM | POA: Diagnosis not present

## 2022-03-19 DIAGNOSIS — R112 Nausea with vomiting, unspecified: Secondary | ICD-10-CM | POA: Diagnosis not present

## 2022-03-19 DIAGNOSIS — K56609 Unspecified intestinal obstruction, unspecified as to partial versus complete obstruction: Secondary | ICD-10-CM | POA: Diagnosis not present

## 2022-03-19 DIAGNOSIS — R1111 Vomiting without nausea: Secondary | ICD-10-CM | POA: Diagnosis not present

## 2022-03-19 DIAGNOSIS — I10 Essential (primary) hypertension: Secondary | ICD-10-CM | POA: Diagnosis not present

## 2022-03-19 DIAGNOSIS — R1084 Generalized abdominal pain: Secondary | ICD-10-CM | POA: Diagnosis not present

## 2022-03-19 DIAGNOSIS — N281 Cyst of kidney, acquired: Secondary | ICD-10-CM | POA: Diagnosis not present

## 2022-03-20 DIAGNOSIS — E119 Type 2 diabetes mellitus without complications: Secondary | ICD-10-CM | POA: Diagnosis not present

## 2022-03-20 DIAGNOSIS — K8020 Calculus of gallbladder without cholecystitis without obstruction: Secondary | ICD-10-CM | POA: Diagnosis not present

## 2022-03-20 DIAGNOSIS — Z7984 Long term (current) use of oral hypoglycemic drugs: Secondary | ICD-10-CM | POA: Diagnosis not present

## 2022-03-20 DIAGNOSIS — Z888 Allergy status to other drugs, medicaments and biological substances status: Secondary | ICD-10-CM | POA: Diagnosis not present

## 2022-03-20 DIAGNOSIS — M1990 Unspecified osteoarthritis, unspecified site: Secondary | ICD-10-CM | POA: Diagnosis not present

## 2022-03-20 DIAGNOSIS — Z9884 Bariatric surgery status: Secondary | ICD-10-CM | POA: Diagnosis not present

## 2022-03-20 DIAGNOSIS — N281 Cyst of kidney, acquired: Secondary | ICD-10-CM | POA: Diagnosis not present

## 2022-03-20 DIAGNOSIS — K559 Vascular disorder of intestine, unspecified: Secondary | ICD-10-CM | POA: Diagnosis not present

## 2022-03-20 DIAGNOSIS — J45909 Unspecified asthma, uncomplicated: Secondary | ICD-10-CM | POA: Diagnosis not present

## 2022-03-20 DIAGNOSIS — R9431 Abnormal electrocardiogram [ECG] [EKG]: Secondary | ICD-10-CM | POA: Diagnosis not present

## 2022-03-20 DIAGNOSIS — Z7982 Long term (current) use of aspirin: Secondary | ICD-10-CM | POA: Diagnosis not present

## 2022-03-20 DIAGNOSIS — Z794 Long term (current) use of insulin: Secondary | ICD-10-CM | POA: Diagnosis not present

## 2022-03-20 DIAGNOSIS — Z881 Allergy status to other antibiotic agents status: Secondary | ICD-10-CM | POA: Diagnosis not present

## 2022-03-20 DIAGNOSIS — R112 Nausea with vomiting, unspecified: Secondary | ICD-10-CM | POA: Diagnosis not present

## 2022-03-20 DIAGNOSIS — R778 Other specified abnormalities of plasma proteins: Secondary | ICD-10-CM | POA: Diagnosis not present

## 2022-03-20 DIAGNOSIS — Z1152 Encounter for screening for COVID-19: Secondary | ICD-10-CM | POA: Diagnosis not present

## 2022-03-20 DIAGNOSIS — K436 Other and unspecified ventral hernia with obstruction, without gangrene: Secondary | ICD-10-CM | POA: Diagnosis not present

## 2022-03-20 DIAGNOSIS — K6389 Other specified diseases of intestine: Secondary | ICD-10-CM | POA: Diagnosis not present

## 2022-03-20 DIAGNOSIS — Z79899 Other long term (current) drug therapy: Secondary | ICD-10-CM | POA: Diagnosis not present

## 2022-03-20 DIAGNOSIS — K56609 Unspecified intestinal obstruction, unspecified as to partial versus complete obstruction: Secondary | ICD-10-CM | POA: Diagnosis not present

## 2022-03-20 DIAGNOSIS — Z885 Allergy status to narcotic agent status: Secondary | ICD-10-CM | POA: Diagnosis not present

## 2022-03-20 DIAGNOSIS — I10 Essential (primary) hypertension: Secondary | ICD-10-CM | POA: Diagnosis not present

## 2022-04-02 ENCOUNTER — Ambulatory Visit (INDEPENDENT_AMBULATORY_CARE_PROVIDER_SITE_OTHER): Payer: Medicare Other | Admitting: Adult Health

## 2022-04-02 ENCOUNTER — Encounter: Payer: Self-pay | Admitting: Adult Health

## 2022-04-02 VITALS — BP 126/74 | HR 52 | Temp 97.7°F | Ht 60.0 in | Wt 167.1 lb

## 2022-04-02 DIAGNOSIS — G894 Chronic pain syndrome: Secondary | ICD-10-CM | POA: Diagnosis not present

## 2022-04-02 DIAGNOSIS — K219 Gastro-esophageal reflux disease without esophagitis: Secondary | ICD-10-CM | POA: Diagnosis not present

## 2022-04-02 DIAGNOSIS — Z794 Long term (current) use of insulin: Secondary | ICD-10-CM | POA: Diagnosis not present

## 2022-04-02 DIAGNOSIS — D649 Anemia, unspecified: Secondary | ICD-10-CM | POA: Diagnosis not present

## 2022-04-02 DIAGNOSIS — H9193 Unspecified hearing loss, bilateral: Secondary | ICD-10-CM

## 2022-04-02 DIAGNOSIS — I10 Essential (primary) hypertension: Secondary | ICD-10-CM | POA: Diagnosis not present

## 2022-04-02 DIAGNOSIS — E1140 Type 2 diabetes mellitus with diabetic neuropathy, unspecified: Secondary | ICD-10-CM

## 2022-04-02 DIAGNOSIS — I25119 Atherosclerotic heart disease of native coronary artery with unspecified angina pectoris: Secondary | ICD-10-CM

## 2022-04-02 DIAGNOSIS — E782 Mixed hyperlipidemia: Secondary | ICD-10-CM | POA: Diagnosis not present

## 2022-04-02 DIAGNOSIS — H532 Diplopia: Secondary | ICD-10-CM

## 2022-04-02 NOTE — Progress Notes (Addendum)
[facility] clinic  Provider: Durenda Age DNP  Code Status:  Full Code  Goals of Care:     12/04/2021    8:07 AM  Advanced Directives  Does Patient Have a Medical Advance Directive? Yes  Type of Advance Directive Out of facility DNR (pink MOST or yellow form)  Does patient want to make changes to medical advance directive? No - Patient declined  Pre-existing out of facility DNR order (yellow form or pink MOST form) Yellow form placed in chart (order not valid for inpatient use)     Chief Complaint  Patient presents with   Medical Management of Chronic Issues    Four month follow up. Discuss need for Diabetic Kidney evaluation- Urine ACR, TDAP vaccine.Fell 10 weeks ago. Went to urgent in [place] nothing broken or fractures. Knot over left eye and left knee is swollen. Memory is fading and she's seeing double out of left eye.    HPI:  This is a 81 y.o. female seen today for a routine follow up. She fell 10 weeks ago and hit her left face on the floor. She complains of occasional left eye double vision. She denies headache and blurry vision. She follows up with an ophthalmologist.  She has a continuous glucose monitoring, reading 224, has not eaten breakfast yet. She takes Glipizide, NPH and Humulin R PRN  Today BP 126/74,  takes Amlodipine and Losartan-HCTZ  She denies chest pains, takes Imdur and Atorvastatin for CAD  She feels happy living in her retirement place. She takes Celexa for depression.   She said that her memory is "fading". MMSE done today and scored 27/30, normal range. She said that she does coloring, drawing and crossword puzzles.  Past Medical History:  Diagnosis Date   Abnormality of gait    Cardiomegaly    Complication of anesthesia    hard time waking up   Depressive disorder, not elsewhere classified    Diabetic retinopathy ([place])    Extrinsic asthma, unspecified    Female stress incontinence    Gout, unspecified    High cholesterol     Lumbago    Memory loss    Migraine without aura, with intractable migraine, so stated, with status migrainosus    Obesity, unspecified    Palpitations    Reflux esophagitis    Type I (juvenile type) diabetes mellitus without mention of complication, uncontrolled    Unspecified essential hypertension    Unspecified hypothyroidism    Unspecified vitamin D deficiency    Ventral hernia, unspecified, without mention of obstruction or gangrene     Past Surgical History:  Procedure Laterality Date   ABDOMINAL HYSTERECTOMY     APPENDECTOMY     BREAST SURGERY     EXPLORATORY LAPAROTOMY W/ BOWEL RESECTION  07/25/2004   PROCEDURE:  Laparoscopy, open laparotomy, resection of jejunojejunostomy   INCISIONAL HERNIA REPAIR  03/19/2006   PROCEDURE:  Open ventral hernia repair with mesh.   LAPAROSCOPIC GASTRIC BYPASS  07/22/2004   PROCEDURE:  Laparoscopic Roux-en-Y gastric bypass, antecolic, antegastric,   LAPAROSCOPIC INCISIONAL / UMBILICAL / VENTRAL HERNIA REPAIR  09/22/2005   PROCEDURE:  Laparoscopic ventral hernia repair with mesh.   LEFT HEART CATH AND CORONARY ANGIOGRAPHY N/A 11/20/2016   Procedure: Left Heart Cath and Coronary Angiography;  Surgeon: Sherren Mocha, MD;  Location: [facility];  Service: Cardiovascular;  Laterality: N/A;   LEFT HEART CATHETERIZATION WITH CORONARY ANGIOGRAM N/A 08/21/2014   Procedure: LEFT HEART CATHETERIZATION WITH CORONARY ANGIOGRAM;  Surgeon:  Sherren Mocha, MD;  Location: [facility];  Service: Cardiovascular;  Laterality: N/A;   stents     last time she said was 1 yr ago     Allergies  Allergen Reactions   Ciprofloxacin Rash   Advair Diskus [Fluticasone-Salmeterol] Other (See Comments)    Throat closes   Albuterol     REACTION: closes throat   Alendronate Sodium Itching   Benadryl [Diphenhydramine Hcl] Itching   Benzonatate     REACTION: rash/hives   Cephalexin Nausea Only   Gabapentin Other (See Comments)    Loss of memory   Iohexol       Desc: rash and DIF BREATHING    Keflex [Cephalexin] Nausea And Vomiting   Morphine Nausea And Vomiting   Other Other (See Comments)    Decongestants- keeps pt awake at night, increased heart rate   Propoxyphene Hcl     Doesn't recall   Reclast [Zoledronic Acid] Other (See Comments)    Chest pain   Avapro [Irbesartan] Rash   Codeine Nausea And Vomiting, Swelling and Rash   Tessalon Perles Rash   Tramadol Nausea Only and Rash    Outpatient Encounter Medications as of 04/02/2022  Medication Sig   amLODipine (NORVASC) 2.5 MG tablet Take one tablet by mouth once daily.   aspirin 81 MG tablet Take 81 mg by mouth daily.   atorvastatin (LIPITOR) 20 MG tablet TAKE 1 TABLET BY MOUTH EVERY DAY   brimonidine (ALPHAGAN) 0.2 % ophthalmic solution Place 1 drop into both eyes 3 (three) times daily.   Cholecalciferol (VITAMIN D3) 5000 UNITS CAPS Take 5,000 Units by mouth daily. For supplement   citalopram (CELEXA) 40 MG tablet Take 1 tablet (40 mg total) by mouth daily.   Continuous Blood Gluc Receiver (FREESTYLE LIBRE 2 READER) DEVI Use to test blood sugar three times daily.  Dx: E11.9   Continuous Blood Gluc Sensor (FREESTYLE LIBRE 2 SENSOR) MISC Use to test blood sugar three times daily. Dx: E11.9   Cyanocobalamin (VITAMIN B-12 PO) Take 1 tablet by mouth daily.   docusate sodium (COLACE) 100 MG capsule Take 100 mg by mouth daily.   ferrous sulfate 325 (65 FE) MG tablet Take 325 mg by mouth daily with breakfast.   glipiZIDE (GLUCOTROL XL) 10 MG 24 hr tablet Take 1 tablet (10 mg total) by mouth daily with breakfast.   insulin NPH Human (HUMULIN N) 100 UNIT/ML injection INJECT 40 UNITS IN THE MORNING AND 40 UNITS IN THE EVENING   insulin regular (HUMULIN R) 100 units/mL injection INJECT 5 UNITS SUBCUTANEOUS AS NEEDED WHEN BLOOD SUGAR IS OVER 300. DX:E10.40   Insulin Syringes, Disposable, U-100 1 ML MISC Use to inject insulin Dx: G89.1694   isosorbide mononitrate (IMDUR) 30 MG 24 hr tablet Take 1  tablet (30 mg total) by mouth daily. Please keep upcoming appt in March 2023 with Dr. Burt Knack before anymore refills. Thank you Final Attempt   losartan-hydrochlorothiazide (HYZAAR) 50-12.5 MG tablet Take 1 tablet by mouth daily.   nitroGLYCERIN (NITROSTAT) 0.4 MG SL tablet Place 0.4 mg under the tongue every 5 (five) minutes as needed for chest pain.   omeprazole (PRILOSEC) 20 MG capsule Take 1 capsule (20 mg total) by mouth daily.   cetirizine (ZYRTEC) 10 MG tablet Take 10 mg by mouth daily as needed for allergies. For allergies   [DISCONTINUED] furosemide (LASIX) 20 MG tablet Take 20 mg by mouth 2 (two) times daily as needed for fluid.   [DISCONTINUED] predniSONE (DELTASONE) 5 MG  tablet TAKE ONE TABLET BY MOUTH ONCE DAILY IN THE MORNING WITH FOOD TO REDUCE PAIN. (Patient taking differently: Take 5 mg by mouth daily. Take one tablet by mouth once daily in the morning with food to reduce pain.)   No facility-administered encounter medications on file as of 04/02/2022.    Review of Systems:  Review of Systems  Constitutional:  Negative for appetite change, chills, fatigue and fever.  HENT:  Positive for hearing loss. Negative for congestion, rhinorrhea and sore throat.   Eyes: Negative.        Has occasional double vision.  Respiratory:  Negative for cough, shortness of breath and wheezing.   Cardiovascular:  Negative for chest pain, palpitations and leg swelling.  Gastrointestinal:  Negative for abdominal pain, constipation, diarrhea, nausea and vomiting.  Genitourinary:  Negative for dysuria.  Musculoskeletal:  Negative for arthralgias, back pain and myalgias.  Skin:  Negative for color change, rash and wound.  Neurological:  Negative for dizziness, weakness and headaches.  Psychiatric/Behavioral:  Negative for behavioral problems. The patient is not nervous/anxious.     Health Maintenance  Topic Date Due   Diabetic kidney evaluation - Urine ACR  11/11/2021   TETANUS/TDAP  11/11/2021    HEMOGLOBIN A1C  04/19/2022   OPHTHALMOLOGY EXAM  06/09/2022   Diabetic kidney evaluation - GFR measurement  07/19/2022   FOOT EXAM  10/22/2022   Medicare Annual Wellness (AWV)  12/05/2022   Pneumonia Vaccine 40+ Years old  Completed   INFLUENZA VACCINE  Completed   DEXA SCAN  Completed   Zoster Vaccines- Shingrix  Completed   HPV VACCINES  Aged Out   COVID-19 Vaccine  Discontinued    Physical Exam: Vitals:   04/02/22 1035  BP: 126/74  Pulse: (!) 52  Temp: 97.7 F (36.5 C)  SpO2: 94%  Weight: 167 lb 1.6 oz (75.8 kg)  Height: 5' (1.524 m)   Body mass index is 32.63 kg/m. Physical Exam Constitutional:      Appearance: She is obese.  HENT:     Head: Normocephalic and atraumatic.     Nose: Nose normal.     Mouth/Throat:     Mouth: Mucous membranes are moist.  Eyes:     Conjunctiva/sclera: Conjunctivae normal.  Cardiovascular:     Rate and Rhythm: Normal rate and regular rhythm.  Pulmonary:     Effort: Pulmonary effort is normal.     Breath sounds: Normal breath sounds.  Abdominal:     General: Bowel sounds are normal.     Palpations: Abdomen is soft.     Comments: LUQ  hernia  Musculoskeletal:        General: Normal range of motion.     Cervical back: Normal range of motion.  Skin:    General: Skin is warm and dry.  Neurological:     General: No focal deficit present.     Mental Status: She is alert and oriented to person, place, and time.  Psychiatric:        Mood and Affect: Mood normal.        Behavior: Behavior normal.        Thought Content: Thought content normal.        Judgment: Judgment normal.     Labs reviewed: Basic Metabolic Panel: Recent Labs    06/12/21 0930 07/17/21 2118 07/19/21 0128  NA 144 138 141  K 4.3 3.6 3.8  CL 107 102 107  CO2 32 26 27  GLUCOSE 91 213* 154*  BUN _0 CREATININE 0.67 0.84 0.86  CALCIUM 8.9 9.2 8.0*   Liver Function Tests: Recent Labs    05/07/21 1103 06/12/21 0930 07/17/21 2118  AST 34 16 24   ALT 33 25 57*  ALKPHOS 82  --  93  BILITOT 0.7 0.7 0.6  PROT 5.9* 5.4* 6.4*  ALBUMIN 3.1*  --  3.5   Recent Labs    07/17/21 2118  LIPASE 27   No results for input(s): "AMMONIA" in the last 8760 hours. CBC: Recent Labs    05/07/21 1103 06/12/21 0930 07/17/21 2118 07/19/21 0128  WBC 8.5 7.0 11.3* 7.5  NEUTROABS 5.4 4,305  --   --   HGB 12.9 12.5 13.6 11.2*  HCT 41.6 39.1 44.2 36.2  MCV 91.8 90.5 94.8 94.3  PLT 314 294 304 202   Lipid Panel: Recent Labs    06/12/21 0930  CHOL 118  HDL 46*  LDLCALC 52  TRIG 115  CHOLHDL 2.6   Lab Results  Component Value Date   HGBA1C 7.4 (H) 10/17/2021    Procedures since last visit: No results found.  Assessment/Plan  1. Type 2 diabetes mellitus with diabetic neuropathy, with long-term current use of insulin (HCC) Lab Results  Component Value Date   HGBA1C 7.4 (H) 10/17/2021   -  blood sugar 224 while in the clinic -  continue Glipizide, NPH and Humulin R PRN  - Hemoglobin A1C  2. Coronary artery disease involving native coronary artery of native heart with angina pectoris ([place]) -   denies chest pains -  continue Imdur and NTG PRN - CBC With Differential/Platelet - CMP with eGFR([facility])  3. Essential hypertension, benign -  BP 126/74, stable -   continue Amlodipine and Losartan-HCTZ  4. Mixed hyperlipidemia Lab Results  Component Value Date   CHOL 118 06/12/2021   HDL 46 (L) 06/12/2021   LDLCALC 52 06/12/2021   TRIG 115 06/12/2021   CHOLHDL 2.6 06/12/2021    -  continue Atorvastatin  5. Chronic pain syndrome -  denies low back pain -  continue Prednisone  6. Gastroesophageal reflux disease without esophagitis -  continue Omeprazole  7. Bilateral hearing loss, unspecified hearing loss type -  thinking of getting a hearing aid  8. Anemia, unspecified type Lab Results  Component Value Date   WBC 7.5 07/19/2021   HGB 11.2 (L) 07/19/2021   HCT 36.2 07/19/2021   MCV 94.3 07/19/2021   PLT 202  07/19/2021   -  continue FeSO4 -  will check CBC to re-evaluate the need  9. Double vision -  has occasional left eye double vision which is thought to be from the fall -  denies headache -   will follow up with ophthalmologist -  discussed if she starts having severe headaches and double vision doesn't resolve, then she needs to call the office to follow up   Labs/tests ordered:  CBC, CMP and A1c  Next appt:  in 3 months

## 2022-04-02 NOTE — Patient Instructions (Addendum)
Diabetes Mellitus Action Plan Following a diabetes action plan is a way for you to manage your diabetes (diabetes mellitus) symptoms. The plan is color-coded to help you understand what actions you need to take based on any symptoms you are having. If you have symptoms in the red zone, you need medical care right away. If you have symptoms in the yellow zone, you are having problems. If you have symptoms in the green zone, you are doing well. Learning about and understanding diabetes can take time. Follow the plan that you develop with your health care provider. Know the target range for your blood sugar (glucose) level, and review your treatment plan with your health care provider at each visit. The target range for my blood sugar level is __________________________ mg/dL. Red zone Get medical help right away if you have any of the following symptoms: A blood sugar test result that is below 54 mg/dL (3 mmol/L). A blood sugar test result that is at or above 240 mg/dL (13.3 mmol/L) for 2 days in a row. Confusion or trouble thinking clearly. Difficulty breathing. Sickness or a fever for 2 or more days that is not getting better. Moderate or large ketone levels in your urine. Feeling tired or having no energy. If you have any red zone symptoms, do not wait to see if the symptoms will go away. Get medical help right away. Call your local emergency services (911 in the [place] Do not drive yourself to the hospital. If you have severely low blood sugar (severe hypoglycemia) and you cannot eat or drink, you may need glucagon. Make sure a family member or close friend knows how to check your blood sugar and how to give you glucagon. You may need to be treated in a hospital for this condition. Yellow zone If you have any of the following symptoms, your diabetes is not under control and you may need to make some changes: A blood sugar test result that is at or above 240 mg/dL (13.3 mmol/L) for 2 days in a  row. Blood sugar test results that are below 70 mg/dL (3.9 mmol/L). Other symptoms of hypoglycemia, such as: Shaking or feeling light-headed. Confusion or irritability. Feeling hungry. Having a fast heartbeat. If you have any yellow zone symptoms: Treat your hypoglycemia by eating or drinking 15 grams of a rapid-acting carbohydrate. Follow the 15:15 rule: Take 15 grams of a rapid-acting carbohydrate, such as: 1 tube of glucose gel. 4 glucose pills. 4 oz (120 mL) of fruit juice. 4 oz (120 mL) of regular (not diet) soda. Check your blood sugar 15 minutes after you take the carbohydrate. If the repeat blood sugar test is still at or below 70 mg/dL (3.9 mmol/L), take 15 grams of a carbohydrate again. If your blood sugar does not increase above 70 mg/dL (3.9 mmol/L) after 3 tries, get medical help right away. After your blood sugar returns to normal, eat a meal or a snack within 1 hour. Keep taking your daily medicines as told by your health care provider. Check your blood sugar more often than you normally would. Write down your results. Call your health care provider if you have trouble keeping your blood sugar in your target range.  Green zone These signs mean you are doing well and you can continue what you are doing to manage your diabetes: Your blood sugar is within your personal target range. For most people, a blood sugar level before a meal (preprandial) should be 80-130 mg/dL (4.4-7.2 mmol/L). You feel   well, and you are able to do daily activities. If you are in the green zone, continue to manage your diabetes as told by your health care provider. To do this: Eat a healthy diet. Exercise regularly. Check your blood sugar as told by your health care provider. Take your medicines as told by your health care provider.  Where to find more information American Diabetes Association (ADA): diabetes.org Association of Diabetes Care & Education Specialists (ADCES):  diabeteseducator.org Summary Following a diabetes action plan is a way for you to manage your diabetes symptoms. The plan is color-coded to help you understand what actions you need to take based on any symptoms you are having. Follow the plan that you develop with your health care provider. Make sure you know your personal target blood sugar level. Review your treatment plan with your health care provider at each visit. This information is not intended to replace advice given to you by your health care provider. Make sure you discuss any questions you have with your health care provider. Document Revised: 11/23/2019 Document Reviewed: 11/23/2019 Elsevier Patient Education  2023 Elsevier Inc.  

## 2022-04-03 LAB — CBC WITH DIFFERENTIAL/PLATELET
Absolute Monocytes: 450 cells/uL (ref 200–950)
Basophils Absolute: 71 cells/uL (ref 0–200)
Basophils Relative: 0.9 %
Eosinophils Absolute: 158 cells/uL (ref 15–500)
Eosinophils Relative: 2 %
HCT: 41.1 % (ref 35.0–45.0)
Hemoglobin: 13.3 g/dL (ref 11.7–15.5)
Lymphs Abs: 2054 cells/uL (ref 850–3900)
MCH: 29.9 pg (ref 27.0–33.0)
MCHC: 32.4 g/dL (ref 32.0–36.0)
MCV: 92.4 fL (ref 80.0–100.0)
MPV: 11.8 fL (ref 7.5–12.5)
Monocytes Relative: 5.7 %
Neutro Abs: 5167 cells/uL (ref 1500–7800)
Neutrophils Relative %: 65.4 %
Platelets: 321 10*3/uL (ref 140–400)
RBC: 4.45 10*6/uL (ref 3.80–5.10)
RDW: 11.9 % (ref 11.0–15.0)
Total Lymphocyte: 26 %
WBC: 7.9 10*3/uL (ref 3.8–10.8)

## 2022-04-03 LAB — COMPLETE METABOLIC PANEL WITH GFR
AG Ratio: 1.4 (calc) (ref 1.0–2.5)
ALT: 13 U/L (ref 6–29)
AST: 15 U/L (ref 10–35)
Albumin: 3.4 g/dL — ABNORMAL LOW (ref 3.6–5.1)
Alkaline phosphatase (APISO): 74 U/L (ref 37–153)
BUN: 15 mg/dL (ref 7–25)
CO2: 29 mmol/L (ref 20–32)
Calcium: 8.7 mg/dL (ref 8.6–10.4)
Chloride: 105 mmol/L (ref 98–110)
Creat: 0.74 mg/dL (ref 0.60–0.95)
Globulin: 2.4 g/dL (calc) (ref 1.9–3.7)
Glucose, Bld: 201 mg/dL — ABNORMAL HIGH (ref 65–99)
Potassium: 4.2 mmol/L (ref 3.5–5.3)
Sodium: 142 mmol/L (ref 135–146)
Total Bilirubin: 0.5 mg/dL (ref 0.2–1.2)
Total Protein: 5.8 g/dL — ABNORMAL LOW (ref 6.1–8.1)
eGFR: 80 mL/min/{1.73_m2} (ref >=60)

## 2022-04-03 LAB — HEMOGLOBIN A1C
Hgb A1c MFr Bld: 7.5 % of total Hgb — ABNORMAL HIGH (ref <5.7)
Mean Plasma Glucose: 169 mg/dL
eAG (mmol/L): 9.3 mmol/L

## 2022-04-07 ENCOUNTER — Ambulatory Visit: Payer: Medicare Other | Admitting: Family Medicine

## 2022-04-08 NOTE — Progress Notes (Signed)
-    A1c 7.5, elevated, up from 7.4 taken 10/17/21, check blood sugar daily and log and bring to next appointment in clinic. -  CBC within normal range -   albumin and total protein slightly low, drink Glucerna daily for supplementation

## 2022-04-18 DIAGNOSIS — R109 Unspecified abdominal pain: Secondary | ICD-10-CM | POA: Diagnosis not present

## 2022-04-18 DIAGNOSIS — I10 Essential (primary) hypertension: Secondary | ICD-10-CM | POA: Diagnosis not present

## 2022-04-18 DIAGNOSIS — I700 Atherosclerosis of aorta: Secondary | ICD-10-CM | POA: Diagnosis not present

## 2022-04-18 DIAGNOSIS — K56609 Unspecified intestinal obstruction, unspecified as to partial versus complete obstruction: Secondary | ICD-10-CM | POA: Diagnosis not present

## 2022-04-18 DIAGNOSIS — E119 Type 2 diabetes mellitus without complications: Secondary | ICD-10-CM | POA: Diagnosis not present

## 2022-04-18 DIAGNOSIS — R112 Nausea with vomiting, unspecified: Secondary | ICD-10-CM | POA: Diagnosis not present

## 2022-04-19 DIAGNOSIS — Z794 Long term (current) use of insulin: Secondary | ICD-10-CM | POA: Diagnosis not present

## 2022-04-19 DIAGNOSIS — K56609 Unspecified intestinal obstruction, unspecified as to partial versus complete obstruction: Secondary | ICD-10-CM | POA: Diagnosis not present

## 2022-04-19 DIAGNOSIS — M1990 Unspecified osteoarthritis, unspecified site: Secondary | ICD-10-CM | POA: Diagnosis not present

## 2022-04-19 DIAGNOSIS — E1165 Type 2 diabetes mellitus with hyperglycemia: Secondary | ICD-10-CM | POA: Diagnosis not present

## 2022-04-19 DIAGNOSIS — R112 Nausea with vomiting, unspecified: Secondary | ICD-10-CM | POA: Diagnosis not present

## 2022-04-19 DIAGNOSIS — Z888 Allergy status to other drugs, medicaments and biological substances status: Secondary | ICD-10-CM | POA: Diagnosis not present

## 2022-04-19 DIAGNOSIS — Z7984 Long term (current) use of oral hypoglycemic drugs: Secondary | ICD-10-CM | POA: Diagnosis not present

## 2022-04-19 DIAGNOSIS — K432 Incisional hernia without obstruction or gangrene: Secondary | ICD-10-CM | POA: Diagnosis not present

## 2022-04-19 DIAGNOSIS — Z881 Allergy status to other antibiotic agents status: Secondary | ICD-10-CM | POA: Diagnosis not present

## 2022-04-19 DIAGNOSIS — E7800 Pure hypercholesterolemia, unspecified: Secondary | ICD-10-CM | POA: Diagnosis not present

## 2022-04-19 DIAGNOSIS — I10 Essential (primary) hypertension: Secondary | ICD-10-CM | POA: Diagnosis not present

## 2022-04-19 DIAGNOSIS — K219 Gastro-esophageal reflux disease without esophagitis: Secondary | ICD-10-CM | POA: Diagnosis not present

## 2022-04-19 DIAGNOSIS — Z885 Allergy status to narcotic agent status: Secondary | ICD-10-CM | POA: Diagnosis not present

## 2022-04-19 DIAGNOSIS — J45909 Unspecified asthma, uncomplicated: Secondary | ICD-10-CM | POA: Diagnosis not present

## 2022-04-19 DIAGNOSIS — Z9884 Bariatric surgery status: Secondary | ICD-10-CM | POA: Diagnosis not present

## 2022-04-19 DIAGNOSIS — R109 Unspecified abdominal pain: Secondary | ICD-10-CM | POA: Diagnosis not present

## 2022-04-19 DIAGNOSIS — Z79899 Other long term (current) drug therapy: Secondary | ICD-10-CM | POA: Diagnosis not present

## 2022-04-19 DIAGNOSIS — I700 Atherosclerosis of aorta: Secondary | ICD-10-CM | POA: Diagnosis not present

## 2022-04-19 DIAGNOSIS — E119 Type 2 diabetes mellitus without complications: Secondary | ICD-10-CM | POA: Diagnosis not present

## 2022-04-19 DIAGNOSIS — K5651 Intestinal adhesions [bands], with partial obstruction: Secondary | ICD-10-CM | POA: Diagnosis not present

## 2022-04-19 DIAGNOSIS — Z7982 Long term (current) use of aspirin: Secondary | ICD-10-CM | POA: Diagnosis not present

## 2022-04-24 ENCOUNTER — Other Ambulatory Visit: Payer: Self-pay | Admitting: Nurse Practitioner

## 2022-04-24 MED ORDER — FREESTYLE LIBRE 2 SENSOR MISC: 11 refills | Status: DC

## 2022-04-28 ENCOUNTER — Telehealth: Payer: Self-pay

## 2022-04-28 MED ORDER — GLIPIZIDE ER 10 MG PO TB24: 10.0000 mg | ORAL_TABLET | Freq: Every day | ORAL | 1 refills | Status: DC

## 2022-04-28 NOTE — Telephone Encounter (Signed)
Patient called for rx on Glipizide 10 mg and medication was send into pharmacy.

## 2022-05-11 DIAGNOSIS — H401131 Primary open-angle glaucoma, bilateral, mild stage: Secondary | ICD-10-CM | POA: Diagnosis not present

## 2022-05-11 DIAGNOSIS — H35372 Puckering of macula, left eye: Secondary | ICD-10-CM | POA: Diagnosis not present

## 2022-05-11 DIAGNOSIS — E113491 Type 2 diabetes mellitus with severe nonproliferative diabetic retinopathy without macular edema, right eye: Secondary | ICD-10-CM | POA: Diagnosis not present

## 2022-05-11 DIAGNOSIS — E113392 Type 2 diabetes mellitus with moderate nonproliferative diabetic retinopathy without macular edema, left eye: Secondary | ICD-10-CM | POA: Diagnosis not present

## 2022-05-14 ENCOUNTER — Other Ambulatory Visit: Payer: Self-pay | Admitting: Cardiovascular Disease

## 2022-07-03 ENCOUNTER — Ambulatory Visit: Payer: Medicare Other | Admitting: Adult Health

## 2022-08-26 ENCOUNTER — Other Ambulatory Visit: Payer: Self-pay | Admitting: Nurse Practitioner

## 2022-08-26 DIAGNOSIS — K219 Gastro-esophageal reflux disease without esophagitis: Secondary | ICD-10-CM

## 2022-08-27 ENCOUNTER — Other Ambulatory Visit: Payer: Self-pay | Admitting: Cardiovascular Disease

## 2022-10-06 ENCOUNTER — Telehealth: Payer: Self-pay | Admitting: Family Medicine

## 2022-10-06 NOTE — Telephone Encounter (Signed)
Received written order for [facility], which requires copy of recent office notes. Patient was last seen in 04/2022. F/u in 07/2022 was cancelled due to patient illness. LVM for patient to call and schedule f/u apt.

## 2022-10-15 ENCOUNTER — Telehealth: Payer: Self-pay

## 2022-10-15 NOTE — Telephone Encounter (Signed)
[name] with [facility] called stating she received an order from Dr.Miller, however it did not have the last OV notes attached (as requested).  Last ov and labs printed and faxed to [phone]

## 2022-11-20 ENCOUNTER — Other Ambulatory Visit: Payer: Self-pay | Admitting: Family Medicine

## 2022-11-21 ENCOUNTER — Other Ambulatory Visit: Payer: Self-pay | Admitting: Cardiovascular Disease

## 2022-12-07 ENCOUNTER — Other Ambulatory Visit: Payer: Self-pay | Admitting: Cardiovascular Disease

## 2022-12-07 ENCOUNTER — Encounter: Payer: Self-pay | Admitting: Family

## 2022-12-07 ENCOUNTER — Other Ambulatory Visit: Payer: Self-pay | Admitting: Nurse Practitioner

## 2022-12-07 ENCOUNTER — Encounter: Payer: Medicare Other | Admitting: Family

## 2022-12-07 DIAGNOSIS — K219 Gastro-esophageal reflux disease without esophagitis: Secondary | ICD-10-CM

## 2022-12-07 NOTE — Progress Notes (Signed)
  This encounter was created in error - please disregard. No show 

## 2022-12-14 ENCOUNTER — Other Ambulatory Visit: Payer: Self-pay

## 2022-12-14 DIAGNOSIS — E1121 Type 2 diabetes mellitus with diabetic nephropathy: Secondary | ICD-10-CM

## 2022-12-14 MED ORDER — INSULIN NPH (HUMAN) (ISOPHANE) 100 UNIT/ML ~~LOC~~ SUSP: SUBCUTANEOUS | 3 refills | Status: DC

## 2022-12-14 MED ORDER — CETIRIZINE HCL 10 MG PO TABS: 10.0000 mg | ORAL_TABLET | Freq: Every day | ORAL | 3 refills | Status: DC | PRN

## 2022-12-14 MED ORDER — INSULIN REGULAR HUMAN 100 UNIT/ML IJ SOLN: 5.0000 [IU] | INTRAMUSCULAR | 2 refills | Status: DC

## 2022-12-14 NOTE — Telephone Encounter (Signed)
High risk or very high risk warning populated when attempting to refill Zyrtec. RX request sent to PCP or covering provider for review and approval if warranted.

## 2022-12-16 ENCOUNTER — Ambulatory Visit: Payer: Medicare Other | Admitting: Family Medicine

## 2022-12-24 ENCOUNTER — Other Ambulatory Visit: Payer: Self-pay | Admitting: Cardiovascular Disease

## 2022-12-29 ENCOUNTER — Other Ambulatory Visit: Payer: Self-pay | Admitting: Cardiovascular Disease

## 2023-01-08 ENCOUNTER — Encounter: Payer: Self-pay | Admitting: Family Medicine

## 2023-01-11 ENCOUNTER — Encounter: Payer: Self-pay | Admitting: Family Medicine

## 2023-01-12 ENCOUNTER — Encounter: Payer: Self-pay | Admitting: Family Medicine

## 2023-01-16 ENCOUNTER — Other Ambulatory Visit: Payer: Self-pay | Admitting: Cardiovascular Disease

## 2023-01-21 ENCOUNTER — Other Ambulatory Visit: Payer: Self-pay

## 2023-01-21 DIAGNOSIS — I25119 Atherosclerotic heart disease of native coronary artery with unspecified angina pectoris: Secondary | ICD-10-CM

## 2023-01-21 DIAGNOSIS — Z794 Long term (current) use of insulin: Secondary | ICD-10-CM

## 2023-01-21 DIAGNOSIS — E782 Mixed hyperlipidemia: Secondary | ICD-10-CM

## 2023-01-22 ENCOUNTER — Other Ambulatory Visit: Payer: Self-pay | Admitting: Cardiovascular Disease

## 2023-01-25 ENCOUNTER — Ambulatory Visit: Payer: Medicare Other

## 2023-01-25 ENCOUNTER — Encounter: Payer: Self-pay | Admitting: Cardiovascular Disease

## 2023-01-25 ENCOUNTER — Ambulatory Visit: Payer: Medicare Other | Attending: Cardiovascular Disease | Admitting: Cardiovascular Disease

## 2023-01-25 VITALS — BP 160/78 | HR 57 | Ht 60.0 in

## 2023-01-25 DIAGNOSIS — E782 Mixed hyperlipidemia: Secondary | ICD-10-CM | POA: Insufficient documentation

## 2023-01-25 DIAGNOSIS — I10 Essential (primary) hypertension: Secondary | ICD-10-CM | POA: Diagnosis not present

## 2023-01-25 DIAGNOSIS — R002 Palpitations: Secondary | ICD-10-CM

## 2023-01-25 NOTE — Patient Instructions (Signed)
Medication Instructions:  Your physician recommends that you continue on your current medications as directed. Please refer to the Current Medication list given to you today.  *If you need a refill on your cardiac medications before your next appointment, please call your pharmacy*  Lab Work: NONE If you have labs (blood work) drawn today and your tests are completely normal, you will receive your results only by: MyChart Message (if you have MyChart) OR A paper copy in the mail If you have any lab test that is abnormal or we need to change your treatment, we will call you to review the results.  Testing/Procedures: 3-day zio heart monitor Your physician has recommended that you wear an event monitor. Event monitors are medical devices that record the heart's electrical activity. Doctors most often [place] these monitors to diagnose arrhythmias. Arrhythmias are problems with the speed or rhythm of the heartbeat. The monitor is a small, portable device. You can wear one while you do your normal daily activities. This is usually used to diagnose what is causing palpitations/syncope (passing out).  Follow-Up: At [facility], you and your health needs are our priority.  As part of our continuing mission to provide you with exceptional heart care, we have created designated Provider Care Teams.  These Care Teams include your primary Cardiologist (physician) and Advanced Practice Providers (APPs -  Physician Assistants and Nurse Practitioners) who all work together to provide you with the care you need, when you need it.  Your next appointment:   1 year(s)  Provider:   Tonny Bollman, MD

## 2023-01-25 NOTE — Progress Notes (Unsigned)
Enrolled for [employer] to mail a ZIO XT long term holter monitor to the patients address on file.  

## 2023-01-25 NOTE — Progress Notes (Signed)
Cardiology Office Note:    Date:  01/25/2023   ID:  [name], DOB 1940-09-05, MRN [id number]  PCP:  Mckenzie Santa, NP   [facility] Providers Cardiologist:  Tonny Bollman, MD     Referring MD: [name]*   Chief Complaint  Patient presents with   Follow-up    History of Present Illness:    [name] is a 82 y.o. female with a hx of Takotsubo syndrome, nonobstructive CAD, hypertension, and mixed hyperlipidemia.  She presented with acute Takotsubo syndrome in 2016 and has had no recurrence.  Her LVEF has normalized.  The patient is here alone today.  She complains of heart palpitations over the last 3 weeks.  States that she is experiencing symptoms of heart racing and also skipped beats.  She has noticed some dizziness as well.  She denies chest pain or pressure.  She has no shortness of breath at rest or with low-level activities.  She states that she is not active enough to know whether she would have shortness of breath with moderate activity.  No orthopnea, PND, or leg swelling.  Past Medical History:  Diagnosis Date   Abnormality of gait    Cardiomegaly    Complication of anesthesia    hard time waking up   Depressive disorder, not elsewhere classified    Diabetic retinopathy (HCC)    Extrinsic asthma, unspecified    Female stress incontinence    Gout, unspecified    High cholesterol    Lumbago    Memory loss    Migraine without aura, with intractable migraine, so stated, with status migrainosus    Obesity, unspecified    Palpitations    Reflux esophagitis    Type I (juvenile type) diabetes mellitus without mention of complication, uncontrolled    Unspecified essential hypertension    Unspecified hypothyroidism    Unspecified vitamin D deficiency    Ventral hernia, unspecified, without mention of obstruction or gangrene     Past Surgical History:  Procedure Laterality Date   ABDOMINAL HYSTERECTOMY     APPENDECTOMY      BREAST SURGERY     EXPLORATORY LAPAROTOMY W/ BOWEL RESECTION  07/25/2004   PROCEDURE:  Laparoscopy, open laparotomy, resection of jejunojejunostomy   INCISIONAL HERNIA REPAIR  03/19/2006   PROCEDURE:  Open ventral hernia repair with mesh.   LAPAROSCOPIC GASTRIC BYPASS  07/22/2004   PROCEDURE:  Laparoscopic Roux-en-Y gastric bypass, antecolic, antegastric,   LAPAROSCOPIC INCISIONAL / UMBILICAL / VENTRAL HERNIA REPAIR  09/22/2005   PROCEDURE:  Laparoscopic ventral hernia repair with mesh.   LEFT HEART CATH AND CORONARY ANGIOGRAPHY N/A 11/20/2016   Procedure: Left Heart Cath and Coronary Angiography;  Surgeon: Tonny Bollman, MD;  Location: [facility] INVASIVE CV LAB;  Service: Cardiovascular;  Laterality: N/A;   LEFT HEART CATHETERIZATION WITH CORONARY ANGIOGRAM N/A 08/21/2014   Procedure: LEFT HEART CATHETERIZATION WITH CORONARY ANGIOGRAM;  Surgeon: Tonny Bollman, MD;  Location: [facility] CATH LAB;  Service: Cardiovascular;  Laterality: N/A;   stents     last time she said was 1 yr ago     Current Medications: No outpatient medications have been marked as taking for the 01/25/23 encounter (Office Visit) with Tonny Bollman, MD.     Allergies:   Ciprofloxacin, Advair diskus [fluticasone-salmeterol], Albuterol, Alendronate sodium, Benadryl [diphenhydramine hcl], Benzonatate, Cephalexin, Gabapentin, Iohexol, Keflex [cephalexin], Morphine, Other, Propoxyphene hcl, Reclast [zoledronic acid], Avapro [irbesartan], Codeine, Tessalon perles, and Tramadol   Social History   Socioeconomic History  Marital status: Widowed    Spouse name: Not on file   Number of children: Not on file   Years of education: Not on file   Highest education level: Not on file  Occupational History   Not on file  Tobacco Use   Smoking status: Never   Smokeless tobacco: Never  Vaping Use   Vaping status: Never Used  Substance and Sexual Activity   Alcohol use: No   Drug use: No   Sexual activity: Not Currently  Other Topics  Concern   Not on file  Social History Narrative   Not on file   Social Determinants of Health   Financial Resource Strain: Low Risk  (11/09/2017)   Overall Financial Resource Strain (CARDIA)    Difficulty of Paying Living Expenses: Not hard at all  Food Insecurity: No Food Insecurity (11/09/2017)   Hunger Vital Sign    Worried About Running Out of Food in the Last Year: Never true    Ran Out of Food in the Last Year: Never true  Transportation Needs: No Transportation Needs (11/09/2017)   PRAPARE - Administrator, Civil Service (Medical): No    Lack of Transportation (Non-Medical): No  Physical Activity: Insufficiently Active (11/09/2017)   Exercise Vital Sign    Days of Exercise per Week: 1 day    Minutes of Exercise per Session: 10 min  Stress: Stress Concern Present (11/09/2017)   [employer] of Occupational Health - Occupational Stress Questionnaire    Feeling of Stress : To some extent  Social Connections: Moderately Isolated (11/09/2017)   Social Connection and Isolation Panel [NHANES]    Frequency of Communication with Friends and Family: More than three times a week    Frequency of Social Gatherings with Friends and Family: More than three times a week    Attends Religious Services: Never    Database administrator or Organizations: No    Attends Banker Meetings: Never    Marital Status: Widowed     Family History: The patient's family history includes Cancer in her brother; Diabetes in her brother, father, and son; Heart disease in her father; Stroke in her father and mother.  ROS:   Please see the history of present illness.    All other systems reviewed and are negative.  EKGs/Labs/Other Studies Reviewed:    The following studies were reviewed today: EKG Interpretation Date/Time:  Monday January 25 2023 12:16:11 EDT Ventricular Rate:  56 PR Interval:    QRS Duration:  62 QT Interval:  382 QTC Calculation: 368 R Axis:   57  Text  Interpretation: Sinus bradycardia When compared with ECG of 18-Jul-2021 00:30, PREVIOUS ECG IS PRESENT the rhythm is now normal sinus and premature supraventricular complexes are no longer present Confirmed by Tonny Bollman [phone]) on 01/25/2023 12:26:24 PM    Recent Labs: 04/02/2022: ALT 13; BUN 15; Creat 0.74; Hemoglobin 13.3; Platelets 321; Potassium 4.2; Sodium 142  Recent Lipid Panel    Component Value Date/Time   CHOL 118 06/12/2021 0930   CHOL 215 (H) 08/07/2015 0854   TRIG 115 06/12/2021 0930   HDL 46 (L) 06/12/2021 0930   HDL 45 08/07/2015 0854   CHOLHDL 2.6 06/12/2021 0930   VLDL 57 (H) 01/06/2021 0816   LDLCALC 52 06/12/2021 0930     Risk Assessment/Calculations:            Physical Exam:    VS:  BP (!) 160/78   Pulse (!) 57  Ht 5' (1.524 m)   SpO2 98%   BMI 32.63 kg/m     Wt Readings from Last 3 Encounters:  04/02/22 167 lb 1.6 oz (75.8 kg)  10/21/21 183 lb 6.4 oz (83.2 kg)  08/27/21 179 lb 6.4 oz (81.4 kg)     GEN:  Well nourished, well developed elderly woman in no acute distress HEENT: Normal NECK: No JVD; No carotid bruits LYMPHATICS: No lymphadenopathy CARDIAC: RRR, no murmurs, rubs, gallops RESPIRATORY:  Clear to auscultation without rales, wheezing or rhonchi  ABDOMEN: Soft, non-tender, non-distended MUSCULOSKELETAL:  No edema; No deformity  SKIN: Warm and dry NEUROLOGIC:  Alert and oriented x 3 PSYCHIATRIC:  Normal affect   ASSESSMENT:    1. Mixed hyperlipidemia   2. Essential hypertension, benign   3. Palpitations    PLAN:    In order of problems listed above:  Treated with atorvastatin.  Cholesterol is 118, LDL 52. Blood pressure is elevated today but the patient was running late for her visit she states that her home readings are normally well-controlled.  She will continue on low-dose amlodipine, losartan, and isosorbide. Noted to have normal LV function on echocardiogram from last year.  I reviewed the study which showed LVEF of  55 to 60%, mild LVH, grade 2 diastolic dysfunction, normal RV function, and no significant valvular disease.  Check 3-day ZIO monitor.      Medication Adjustments/Labs and Tests Ordered: Current medicines are reviewed at length with the patient today.  Concerns regarding medicines are outlined above.  Orders Placed This Encounter  Procedures   LONG TERM MONITOR (3-14 DAYS)   EKG 12-Lead   No orders of the defined types were placed in this encounter.   Patient Instructions  Medication Instructions:  Your physician recommends that you continue on your current medications as directed. Please refer to the Current Medication list given to you today.  *If you need a refill on your cardiac medications before your next appointment, please call your pharmacy*  Lab Work: NONE If you have labs (blood work) drawn today and your tests are completely normal, you will receive your results only by: MyChart Message (if you have MyChart) OR A paper copy in the mail If you have any lab test that is abnormal or we need to change your treatment, we will call you to review the results.  Testing/Procedures: 3-day zio heart monitor Your physician has recommended that you wear an event monitor. Event monitors are medical devices that record the heart's electrical activity. Doctors most often [place] these monitors to diagnose arrhythmias. Arrhythmias are problems with the speed or rhythm of the heartbeat. The monitor is a small, portable device. You can wear one while you do your normal daily activities. This is usually used to diagnose what is causing palpitations/syncope (passing out).  Follow-Up: At [facility], you and your health needs are our priority.  As part of our continuing mission to provide you with exceptional heart care, we have created designated Provider Care Teams.  These Care Teams include your primary Cardiologist (physician) and Advanced Practice Providers (APPs -  Physician  Assistants and Nurse Practitioners) who all work together to provide you with the care you need, when you need it.  Your next appointment:   1 year(s)  Provider:   Tonny Bollman, MD        Signed, Tonny Bollman, MD  01/25/2023 1:10 PM    [facility]

## 2023-01-27 ENCOUNTER — Other Ambulatory Visit: Payer: Self-pay

## 2023-01-27 ENCOUNTER — Other Ambulatory Visit: Payer: Medicare Other

## 2023-01-27 DIAGNOSIS — H401131 Primary open-angle glaucoma, bilateral, mild stage: Secondary | ICD-10-CM | POA: Diagnosis not present

## 2023-01-27 DIAGNOSIS — H532 Diplopia: Secondary | ICD-10-CM | POA: Diagnosis not present

## 2023-01-27 DIAGNOSIS — Z794 Long term (current) use of insulin: Secondary | ICD-10-CM

## 2023-01-27 DIAGNOSIS — H35372 Puckering of macula, left eye: Secondary | ICD-10-CM | POA: Diagnosis not present

## 2023-01-27 DIAGNOSIS — E782 Mixed hyperlipidemia: Secondary | ICD-10-CM

## 2023-01-27 DIAGNOSIS — E113491 Type 2 diabetes mellitus with severe nonproliferative diabetic retinopathy without macular edema, right eye: Secondary | ICD-10-CM | POA: Diagnosis not present

## 2023-01-27 DIAGNOSIS — I25119 Atherosclerotic heart disease of native coronary artery with unspecified angina pectoris: Secondary | ICD-10-CM | POA: Diagnosis not present

## 2023-01-27 DIAGNOSIS — E1140 Type 2 diabetes mellitus with diabetic neuropathy, unspecified: Secondary | ICD-10-CM | POA: Diagnosis not present

## 2023-01-27 DIAGNOSIS — E113392 Type 2 diabetes mellitus with moderate nonproliferative diabetic retinopathy without macular edema, left eye: Secondary | ICD-10-CM | POA: Diagnosis not present

## 2023-01-27 NOTE — Progress Notes (Signed)
CBC all normal, not anemic

## 2023-01-28 LAB — LIPID PANEL
Cholesterol: 143 mg/dL (ref <200)
HDL: 56 mg/dL (ref >=50)
LDL Cholesterol (Calc): 67 mg/dL
Non-HDL Cholesterol (Calc): 87 mg/dL (ref <130)
Total CHOL/HDL Ratio: 2.6 (calc) (ref <5.0)
Triglycerides: 114 mg/dL (ref <150)

## 2023-01-28 LAB — CBC WITH DIFFERENTIAL/PLATELET
Absolute Monocytes: 442 {cells}/uL (ref 200–950)
Basophils Absolute: 59 {cells}/uL (ref 0–200)
Basophils Relative: 0.9 %
Eosinophils Absolute: 189 {cells}/uL (ref 15–500)
Eosinophils Relative: 2.9 %
HCT: 38.6 % (ref 35.0–45.0)
Hemoglobin: 12.4 g/dL (ref 11.7–15.5)
Lymphs Abs: 2171 {cells}/uL (ref 850–3900)
MCH: 29.5 pg (ref 27.0–33.0)
MCHC: 32.1 g/dL (ref 32.0–36.0)
MCV: 91.7 fL (ref 80.0–100.0)
MPV: 11.6 fL (ref 7.5–12.5)
Monocytes Relative: 6.8 %
Neutro Abs: 3640 {cells}/uL (ref 1500–7800)
Neutrophils Relative %: 56 %
Platelets: 311 10*3/uL (ref 140–400)
RBC: 4.21 10*6/uL (ref 3.80–5.10)
RDW: 12 % (ref 11.0–15.0)
Total Lymphocyte: 33.4 %
WBC: 6.5 10*3/uL (ref 3.8–10.8)

## 2023-01-28 LAB — COMPLETE METABOLIC PANEL WITH GFR
AG Ratio: 1.3 (calc) (ref 1.0–2.5)
ALT: 11 U/L (ref 6–29)
AST: 14 U/L (ref 10–35)
Albumin: 3.2 g/dL — ABNORMAL LOW (ref 3.6–5.1)
Alkaline phosphatase (APISO): 81 U/L (ref 37–153)
BUN: 15 mg/dL (ref 7–25)
CO2: 31 mmol/L (ref 20–32)
Calcium: 9.3 mg/dL (ref 8.6–10.4)
Chloride: 103 mmol/L (ref 98–110)
Creat: 0.75 mg/dL (ref 0.60–0.95)
Globulin: 2.4 g/dL (ref 1.9–3.7)
Glucose, Bld: 114 mg/dL (ref 65–139)
Potassium: 4.6 mmol/L (ref 3.5–5.3)
Sodium: 141 mmol/L (ref 135–146)
Total Bilirubin: 0.6 mg/dL (ref 0.2–1.2)
Total Protein: 5.6 g/dL — ABNORMAL LOW (ref 6.1–8.1)
eGFR: 78 mL/min/{1.73_m2} (ref >=60)

## 2023-01-28 LAB — HEMOGLOBIN A1C
Hgb A1c MFr Bld: 7.6 %{Hb} — ABNORMAL HIGH (ref <5.7)
Mean Plasma Glucose: 171 mg/dL
eAG (mmol/L): 9.5 mmol/L

## 2023-01-28 NOTE — Progress Notes (Signed)
-    electrolytes, liver enzymes, lipids and CBC all normal -  A1C up by 0.1, will discuss on clinic visit 01/29/23

## 2023-01-29 ENCOUNTER — Ambulatory Visit (INDEPENDENT_AMBULATORY_CARE_PROVIDER_SITE_OTHER): Payer: Medicare Other | Admitting: Adult Health

## 2023-01-29 ENCOUNTER — Encounter: Payer: Self-pay | Admitting: Adult Health

## 2023-01-29 VITALS — BP 141/88 | HR 50 | Temp 97.4°F | Resp 18 | Ht 60.0 in | Wt 174.4 lb

## 2023-01-29 DIAGNOSIS — F5101 Primary insomnia: Secondary | ICD-10-CM | POA: Diagnosis not present

## 2023-01-29 DIAGNOSIS — E1140 Type 2 diabetes mellitus with diabetic neuropathy, unspecified: Secondary | ICD-10-CM

## 2023-01-29 DIAGNOSIS — H9193 Unspecified hearing loss, bilateral: Secondary | ICD-10-CM

## 2023-01-29 DIAGNOSIS — E782 Mixed hyperlipidemia: Secondary | ICD-10-CM | POA: Diagnosis not present

## 2023-01-29 DIAGNOSIS — D649 Anemia, unspecified: Secondary | ICD-10-CM

## 2023-01-29 DIAGNOSIS — K219 Gastro-esophageal reflux disease without esophagitis: Secondary | ICD-10-CM | POA: Diagnosis not present

## 2023-01-29 DIAGNOSIS — Z23 Encounter for immunization: Secondary | ICD-10-CM | POA: Diagnosis not present

## 2023-01-29 DIAGNOSIS — Z794 Long term (current) use of insulin: Secondary | ICD-10-CM

## 2023-01-29 DIAGNOSIS — G894 Chronic pain syndrome: Secondary | ICD-10-CM

## 2023-01-29 DIAGNOSIS — I10 Essential (primary) hypertension: Secondary | ICD-10-CM

## 2023-01-29 DIAGNOSIS — F332 Major depressive disorder, recurrent severe without psychotic features: Secondary | ICD-10-CM | POA: Diagnosis not present

## 2023-01-29 DIAGNOSIS — G3184 Mild cognitive impairment, so stated: Secondary | ICD-10-CM

## 2023-01-29 DIAGNOSIS — I25119 Atherosclerotic heart disease of native coronary artery with unspecified angina pectoris: Secondary | ICD-10-CM

## 2023-01-29 MED ORDER — TRAZODONE HCL 50 MG PO TABS: 25.0000 mg | ORAL_TABLET | Freq: Every day | ORAL | 3 refills | Status: DC

## 2023-01-29 MED ORDER — MEMANTINE HCL 5 MG PO TABS
5.0000 mg | ORAL_TABLET | Freq: Every day | ORAL | 1 refills | Status: DC
Start: 2023-01-29 — End: 2023-02-24

## 2023-01-29 NOTE — Progress Notes (Unsigned)
[facility] clinic  Provider: Kenard Gower DNP  Code Status:  DNR  Goals of Care:     01/29/2023    8:39 AM  Advanced Directives  Does Patient Have a Medical Advance Directive? Yes  Type of Advance Directive Out of facility DNR (pink MOST or yellow form)  Does patient want to make changes to medical advance directive? No - Patient declined  Pre-existing out of facility DNR order (yellow form or pink MOST form) Yellow form placed in chart (order not valid for inpatient use)     Chief Complaint  Patient presents with   Follow-up     6 mth fu    Immunizations    DTAP and Influenza    Quality Metric Gaps    Medical Annual Wellness visit, eye and foot exam,  Diabetic Kidney evaluation-Urine ACR    HPI: Patient is a 82 y.o. female seen today for a 64-month follow up of chronic issues. She complains of insomnia. She stated that Melatonin does not help. She complained of forgetting a lot.   Type 2 diabetes mellitus with diabetic neuropathy, with long-term current use of insulin (HCC) -  A1C 7.6, takes Glipizide, NPH Insulin and Regular insulin  Coronary artery disease involving native coronary artery of native heart with angina pectoris (HCC) -  denies chest pains, takes Imdur and NTG PRN  Gastroesophageal reflux disease without esophagitis -  denies heartburns, takes Omeprazole  Essential hypertension, benign  -  BP 141/88, takes Amlodipine and Losartan-HCTZ  Severe episode of recurrent major depressive disorder, without psychotic features (HCC) -  takes Celexa     Past Medical History:  Diagnosis Date   Abnormality of gait    Cardiomegaly    Complication of anesthesia    hard time waking up   Depressive disorder, not elsewhere classified    Diabetic retinopathy (HCC)    Extrinsic asthma, unspecified    Female stress incontinence    Gout, unspecified    High cholesterol    Lumbago    Memory loss    Migraine without aura, with intractable migraine, so stated, with  status migrainosus    Obesity, unspecified    Palpitations    Reflux esophagitis    Type I (juvenile type) diabetes mellitus without mention of complication, uncontrolled    Unspecified essential hypertension    Unspecified hypothyroidism    Unspecified vitamin D deficiency    Ventral hernia, unspecified, without mention of obstruction or gangrene     Past Surgical History:  Procedure Laterality Date   ABDOMINAL HYSTERECTOMY     APPENDECTOMY     BREAST SURGERY     EXPLORATORY LAPAROTOMY W/ BOWEL RESECTION  07/25/2004   PROCEDURE:  Laparoscopy, open laparotomy, resection of jejunojejunostomy   INCISIONAL HERNIA REPAIR  03/19/2006   PROCEDURE:  Open ventral hernia repair with mesh.   LAPAROSCOPIC GASTRIC BYPASS  07/22/2004   PROCEDURE:  Laparoscopic Roux-en-Y gastric bypass, antecolic, antegastric,   LAPAROSCOPIC INCISIONAL / UMBILICAL / VENTRAL HERNIA REPAIR  09/22/2005   PROCEDURE:  Laparoscopic ventral hernia repair with mesh.   LEFT HEART CATH AND CORONARY ANGIOGRAPHY N/A 11/20/2016   Procedure: Left Heart Cath and Coronary Angiography;  Surgeon: Tonny Bollman, MD;  Location: [facility] INVASIVE CV LAB;  Service: Cardiovascular;  Laterality: N/A;   LEFT HEART CATHETERIZATION WITH CORONARY ANGIOGRAM N/A 08/21/2014   Procedure: LEFT HEART CATHETERIZATION WITH CORONARY ANGIOGRAM;  Surgeon: Tonny Bollman, MD;  Location: [facility] CATH LAB;  Service: Cardiovascular;  Laterality: N/A;   stents  last time she said was 1 yr ago     Allergies  Allergen Reactions   Ciprofloxacin Rash   Advair Diskus [Fluticasone-Salmeterol] Other (See Comments)    Throat closes   Albuterol     REACTION: closes throat   Alendronate Sodium Itching   Benadryl [Diphenhydramine Hcl] Itching   Benzonatate     REACTION: rash/hives   Cephalexin Nausea Only   Gabapentin Other (See Comments)    Loss of memory   Iohexol      Desc: rash and DIF BREATHING    Keflex [Cephalexin] Nausea And Vomiting   Morphine Nausea  And Vomiting   Other Other (See Comments)    Decongestants- keeps pt awake at night, increased heart rate   Propoxyphene Hcl     Doesn't recall   Reclast [Zoledronic Acid] Other (See Comments)    Chest pain   Avapro [Irbesartan] Rash   Codeine Nausea And Vomiting, Swelling and Rash   Tessalon Perles Rash   Tramadol Nausea Only and Rash    Outpatient Encounter Medications as of 01/29/2023  Medication Sig   amLODipine (NORVASC) 2.5 MG tablet TAKE 1 TABLET BY MOUTH EVERY DAY   aspirin 81 MG tablet Take 81 mg by mouth daily.   atorvastatin (LIPITOR) 20 MG tablet TAKE 1 TABLET BY MOUTH EVERY DAY   brimonidine (ALPHAGAN) 0.2 % ophthalmic solution Place 1 drop into both eyes 3 (three) times daily.   cetirizine (ZYRTEC) 10 MG tablet Take 1 tablet (10 mg total) by mouth daily as needed for allergies. For allergies   Cholecalciferol (VITAMIN D3) 5000 UNITS CAPS Take 5,000 Units by mouth daily. For supplement   citalopram (CELEXA) 40 MG tablet TAKE 1 TABLET BY MOUTH EVERY DAY   Continuous Blood Gluc Receiver (FREESTYLE LIBRE 2 READER) DEVI Use to test blood sugar three times daily.  Dx: E11.9   Continuous Blood Gluc Sensor (FREESTYLE LIBRE 2 SENSOR) MISC Use to test blood sugar three times daily. Dx: E11.9   Cyanocobalamin (VITAMIN B-12 PO) Take 1 tablet by mouth daily.   docusate sodium (COLACE) 100 MG capsule Take 100 mg by mouth daily.   ferrous sulfate 325 (65 FE) MG tablet Take 325 mg by mouth daily with breakfast.   glipiZIDE (GLUCOTROL XL) 10 MG 24 hr tablet TAKE 1 TABLET (10 MG TOTAL) BY MOUTH DAILY WITH BREAKFAST.   insulin NPH Human (HUMULIN N) 100 UNIT/ML injection INJECT 40 UNITS IN THE MORNING AND 40 UNITS IN THE EVENING   insulin regular (HUMULIN R) 100 units/mL injection Inject 0.05 mLs (5 Units total) into the skin as directed. WHEN BLOOD SUGAR IS OVER 300. DX:E10.40   Insulin Syringes, Disposable, U-100 1 ML MISC Use to inject insulin Dx: J18.8416   isosorbide mononitrate (IMDUR)  30 MG 24 hr tablet Take 1 tablet (30 mg total) by mouth daily.   losartan-hydrochlorothiazide (HYZAAR) 50-12.5 MG tablet Take 1 tablet by mouth daily.   nitroGLYCERIN (NITROSTAT) 0.4 MG SL tablet Place 0.4 mg under the tongue every 5 (five) minutes as needed for chest pain.   omeprazole (PRILOSEC) 20 MG capsule TAKE 1 CAPSULE BY MOUTH EVERY DAY   No facility-administered encounter medications on file as of 01/29/2023.    Review of Systems:  Review of Systems  Constitutional:  Negative for appetite change, chills, fatigue and fever.  HENT:  Negative for congestion, hearing loss, rhinorrhea and sore throat.   Eyes: Negative.   Respiratory:  Negative for cough, shortness of breath and wheezing.  Cardiovascular:  Negative for chest pain, palpitations and leg swelling.  Gastrointestinal:  Negative for abdominal pain, constipation, diarrhea, nausea and vomiting.  Genitourinary:  Negative for dysuria.  Musculoskeletal:  Negative for arthralgias, back pain and myalgias.  Skin:  Negative for color change, rash and wound.  Neurological:  Negative for dizziness, weakness and headaches.  Psychiatric/Behavioral:  Negative for behavioral problems. The patient is not nervous/anxious.     Health Maintenance  Topic Date Due   Diabetic kidney evaluation - Urine ACR  Never done   DTaP/Tdap/Td (3 - Td or Tdap) 11/11/2021   OPHTHALMOLOGY EXAM  06/09/2022   FOOT EXAM  10/22/2022   Medicare Annual Wellness (AWV)  12/05/2022   INFLUENZA VACCINE  12/31/2022   HEMOGLOBIN A1C  07/30/2023   Diabetic kidney evaluation - eGFR measurement  01/27/2024   Pneumonia Vaccine 35+ Years old  Completed   DEXA SCAN  Completed   Zoster Vaccines- Shingrix  Completed   HPV VACCINES  Aged Out   COVID-19 Vaccine  Discontinued    Physical Exam: Vitals:   01/29/23 0838  BP: (!) 141/88  Pulse: (!) 50  Resp: 18  Temp: (!) 97.4 F (36.3 C)  SpO2: 97%  Weight: 174 lb 6.4 oz (79.1 kg)  Height: 5' (1.524 m)   Body  mass index is 34.06 kg/m. Physical Exam Constitutional:      Appearance: Normal appearance.  HENT:     Head: Normocephalic and atraumatic.     Nose: Nose normal.     Mouth/Throat:     Mouth: Mucous membranes are moist.  Eyes:     Conjunctiva/sclera: Conjunctivae normal.  Cardiovascular:     Rate and Rhythm: Normal rate and regular rhythm.  Pulmonary:     Effort: Pulmonary effort is normal.     Breath sounds: Normal breath sounds.  Abdominal:     General: Bowel sounds are normal.     Palpations: Abdomen is soft.  Musculoskeletal:        General: Normal range of motion.     Cervical back: Normal range of motion.  Skin:    General: Skin is warm and dry.  Neurological:     General: No focal deficit present.     Mental Status: She is alert and oriented to person, place, and time.  Psychiatric:        Mood and Affect: Mood normal.        Behavior: Behavior normal.        Thought Content: Thought content normal.        Judgment: Judgment normal.    Labs reviewed: Basic Metabolic Panel: Recent Labs    04/02/22 1135 01/27/23 0944  NA 142 141  K 4.2 4.6  CL 105 103  CO2 29 31  GLUCOSE 201* 114  BUN 15 15  CREATININE 0.74 0.75  CALCIUM 8.7 9.3   Liver Function Tests: Recent Labs    04/02/22 1135 01/27/23 0944  AST 15 14  ALT 13 11  BILITOT 0.5 0.6  PROT 5.8* 5.6*   No results for input(s): "LIPASE", "AMYLASE" in the last 8760 hours. No results for input(s): "AMMONIA" in the last 8760 hours. CBC: Recent Labs    04/02/22 1135 01/27/23 0944  WBC 7.9 6.5  NEUTROABS 5,167 3,640  HGB 13.3 12.4  HCT 41.1 38.6  MCV 92.4 91.7  PLT 321 311   Lipid Panel: Recent Labs    01/27/23 0944  CHOL 143  HDL 56  LDLCALC 67  TRIG 114  CHOLHDL 2.6   Lab Results  Component Value Date   HGBA1C 7.6 (H) 01/27/2023    Procedures since last visit: No results found.  Assessment/Plan  1. Primary insomnia -  will start on Trazodone - traZODone (DESYREL) 50 MG  tablet; Take 0.5 tablets (25 mg total) by mouth at bedtime.  Dispense: 30 tablet; Refill: 3  2. Type 2 diabetes mellitus with diabetic neuropathy, with long-term current use of insulin (HCC)  Lab Results  Component Value Date   HGBA1C 7.6 (H) 01/27/2023  -   continue current medications - Microalbumin/Creatinine Ratio, Urine  3. Coronary artery disease involving native coronary artery of native heart with angina pectoris (HCC) -  denies chest pain -  continue Imdur and NTG PRN  4. Gastroesophageal reflux disease without esophagitis -  stable -  continue Omeprazole  5. Mixed hyperlipidemia Lab Results  Component Value Date   CHOL 143 01/27/2023   HDL 56 01/27/2023   LDLCALC 67 01/27/2023   TRIG 114 01/27/2023   CHOLHDL 2.6 01/27/2023    -  continue Atorvastatin  6. Essential hypertension, benign - stable -  continue Amlodipine and Losartan-hydrochlorothiazide -  monitor BP, log and bring to next appointment  7. Severe episode of recurrent major depressive disorder, without psychotic features (HCC) -  mood is stable -  continue Celexa  8. MCI (mild cognitive impairment) -  MMSE administered, scored 30/30 - memantine (NAMENDA) 5 MG tablet; Take 1 tablet (5 mg total) by mouth daily.  Dispense: 30 tablet; Refill: 1  9. Flu vaccine need -  Influenza vaccine given     Labs/tests ordered:   urine microalbumin  Next appt:  Visit date not found

## 2023-01-30 LAB — MICROALBUMIN / CREATININE URINE RATIO
Creatinine, Urine: 156 mg/dL (ref 20–275)
Microalb Creat Ratio: 412 mg/g{creat} — ABNORMAL HIGH (ref <30)
Microalb, Ur: 64.2 mg/dL

## 2023-02-01 NOTE — Progress Notes (Signed)
-    urine microalbumin creatinine ratio is elevated, continue Losartan-HCTZ

## 2023-02-19 ENCOUNTER — Encounter: Payer: Medicare Other | Admitting: Adult Health

## 2023-02-19 NOTE — Progress Notes (Signed)
This encounter was created in error - please disregard.

## 2023-02-23 ENCOUNTER — Other Ambulatory Visit: Payer: Self-pay | Admitting: Cardiovascular Disease

## 2023-02-24 ENCOUNTER — Other Ambulatory Visit: Payer: Self-pay | Admitting: Adult Health

## 2023-02-24 DIAGNOSIS — G3184 Mild cognitive impairment, so stated: Secondary | ICD-10-CM

## 2023-03-13 ENCOUNTER — Other Ambulatory Visit: Payer: Self-pay | Admitting: Family Medicine

## 2023-03-23 ENCOUNTER — Telehealth: Payer: Self-pay

## 2023-03-23 NOTE — Telephone Encounter (Signed)
Message left on clinical intake voicemail:   Patient would like a return call to discuss her diabetic mediction.  Outgoing call placed to patient, no answer, and voicemail full. We will need to make additional attempts to reach patient.

## 2023-03-26 ENCOUNTER — Other Ambulatory Visit: Payer: Self-pay | Admitting: Adult Health

## 2023-03-26 DIAGNOSIS — F5101 Primary insomnia: Secondary | ICD-10-CM

## 2023-03-26 NOTE — Telephone Encounter (Signed)
Pharmacy requested refill Pended Rx and sent to [facility] for approval due to HIGH ALERT Warning.

## 2023-04-13 ENCOUNTER — Ambulatory Visit (INDEPENDENT_AMBULATORY_CARE_PROVIDER_SITE_OTHER): Payer: Medicare Other | Admitting: Family

## 2023-04-13 ENCOUNTER — Encounter: Payer: Self-pay | Admitting: Family

## 2023-04-13 ENCOUNTER — Other Ambulatory Visit: Payer: Self-pay

## 2023-04-13 VITALS — BP 122/78 | HR 63 | Temp 98.6°F | Resp 18 | Ht 60.0 in | Wt 171.0 lb

## 2023-04-13 DIAGNOSIS — H18413 Arcus senilis, bilateral: Secondary | ICD-10-CM | POA: Diagnosis not present

## 2023-04-13 DIAGNOSIS — Z794 Long term (current) use of insulin: Secondary | ICD-10-CM

## 2023-04-13 DIAGNOSIS — Z961 Presence of intraocular lens: Secondary | ICD-10-CM | POA: Diagnosis not present

## 2023-04-13 DIAGNOSIS — H40053 Ocular hypertension, bilateral: Secondary | ICD-10-CM | POA: Diagnosis not present

## 2023-04-13 DIAGNOSIS — E1140 Type 2 diabetes mellitus with diabetic neuropathy, unspecified: Secondary | ICD-10-CM

## 2023-04-13 DIAGNOSIS — H16223 Keratoconjunctivitis sicca, not specified as Sjogren's, bilateral: Secondary | ICD-10-CM | POA: Diagnosis not present

## 2023-04-13 DIAGNOSIS — R42 Dizziness and giddiness: Secondary | ICD-10-CM | POA: Diagnosis not present

## 2023-04-13 MED ORDER — MECLIZINE HCL 12.5 MG PO TABS: 12.5000 mg | ORAL_TABLET | Freq: Three times a day (TID) | ORAL | 0 refills | Status: DC | PRN

## 2023-04-13 MED ORDER — FREESTYLE LIBRE 3 SENSOR MISC
1.0000 | 12 refills | Status: DC
Start: 2023-04-13 — End: 2023-05-24

## 2023-04-13 MED ORDER — FREESTYLE LIBRE 3 READER DEVI
1.0000 | Freq: Three times a day (TID) | 12 refills | Status: AC
Start: 2023-04-13 — End: ?

## 2023-04-13 NOTE — Progress Notes (Signed)
Provider: Carilyn Goodpasture Analaya Hoey FNP-C  Medina-Vargas, Margit Banda, NP  Patient Care Team: Gillis Santa, NP as PCP - General (Internal Medicine) Tonny Bollman, MD as PCP - Cardiology (Cardiology) Glenna Fellows, MD (Inactive) as Consulting Physician (General Surgery) Nyoka Cowden, MD as Consulting Physician (Pulmonary Disease) Bernette Redbird, MD as Consulting Physician (Gastroenterology) Othella Boyer, MD as Consulting Physician (Cardiology) Luciana Axe Alford Highland, MD as Consulting Physician (Ophthalmology) Mia Creek, MD as Consulting Physician (Ophthalmology)  Extended Emergency Contact Information Primary Emergency Contact: Regan [place] Mobile Phone: [phone] Relation: Son  Code Status:  DNR Goals of care: Advanced Directive information    04/13/2023   12:09 PM  Advanced Directives  Does Patient Have a Medical Advance Directive? Yes  Type of Advance Directive Out of facility DNR (pink MOST or yellow form)  Does patient want to make changes to medical advance directive? No - Patient declined  Pre-existing out of facility DNR order (yellow form or pink MOST form) Yellow form placed in chart (order not valid for inpatient use)     Chief Complaint  Patient presents with   Acute Visit    dizziness     HPI:  Pt is a 82 y.o. female seen today for an acute visit for evaluation of dizziness x 4 weeks.she is here with her daughter.describes dizziness as swimmy head.No worsening of symptoms with standing or change of position.she denies any headache,vision changes,fatigue,chest tightness,palpitation,chest pain or shortness of breath. States just saw the ophthalmologist prior to coming here. Has had dizziness in the past.took meclizine which was effective.  Not skipping meals.blood sugars runs in the 100's - 200's with few 300's.Few readings also in the 50's.she request Free Style Josephine Igo since her reader sometimes shows false readings.     Past Medical History:  Diagnosis Date   Abnormality of gait    Cardiomegaly    Complication of anesthesia    hard time waking up   Depressive disorder, not elsewhere classified    Diabetic retinopathy (HCC)    Extrinsic asthma, unspecified    Female stress incontinence    Gout, unspecified    High cholesterol    Lumbago    Memory loss    Migraine without aura, with intractable migraine, so stated, with status migrainosus    Obesity, unspecified    Palpitations    Reflux esophagitis    Type I (juvenile type) diabetes mellitus without mention of complication, uncontrolled    Unspecified essential hypertension    Unspecified hypothyroidism    Unspecified vitamin D deficiency    Ventral hernia, unspecified, without mention of obstruction or gangrene    Past Surgical History:  Procedure Laterality Date   ABDOMINAL HYSTERECTOMY     APPENDECTOMY     BREAST SURGERY     EXPLORATORY LAPAROTOMY W/ BOWEL RESECTION  07/25/2004   PROCEDURE:  Laparoscopy, open laparotomy, resection of jejunojejunostomy   INCISIONAL HERNIA REPAIR  03/19/2006   PROCEDURE:  Open ventral hernia repair with mesh.   LAPAROSCOPIC GASTRIC BYPASS  07/22/2004   PROCEDURE:  Laparoscopic Roux-en-Y gastric bypass, antecolic, antegastric,   LAPAROSCOPIC INCISIONAL / UMBILICAL / VENTRAL HERNIA REPAIR  09/22/2005   PROCEDURE:  Laparoscopic ventral hernia repair with mesh.   LEFT HEART CATH AND CORONARY ANGIOGRAPHY N/A 11/20/2016   Procedure: Left Heart Cath and Coronary Angiography;  Surgeon: Tonny Bollman, MD;  Location: [facility] INVASIVE CV LAB;  Service: Cardiovascular;  Laterality: N/A;   LEFT HEART CATHETERIZATION WITH CORONARY ANGIOGRAM N/A 08/21/2014  Procedure: LEFT HEART CATHETERIZATION WITH CORONARY ANGIOGRAM;  Surgeon: Tonny Bollman, MD;  Location: [facility] CATH LAB;  Service: Cardiovascular;  Laterality: N/A;   stents     last time she said was 1 yr ago     Allergies  Allergen Reactions   Ciprofloxacin Rash    Advair Diskus [Fluticasone-Salmeterol] Other (See Comments)    Throat closes   Albuterol     REACTION: closes throat   Alendronate Sodium Itching   Benadryl [Diphenhydramine Hcl] Itching   Benzonatate     REACTION: rash/hives   Cephalexin Nausea Only   Gabapentin Other (See Comments)    Loss of memory   Iohexol      Desc: rash and DIF BREATHING    Keflex [Cephalexin] Nausea And Vomiting   Morphine Nausea And Vomiting   Other Other (See Comments)    Decongestants- keeps pt awake at night, increased heart rate   Propoxyphene Hcl     Doesn't recall   Reclast [Zoledronic Acid] Other (See Comments)    Chest pain   Avapro [Irbesartan] Rash   Codeine Nausea And Vomiting, Swelling and Rash   Tessalon Perles Rash   Tramadol Nausea Only and Rash    Outpatient Encounter Medications as of 04/13/2023  Medication Sig   amLODipine (NORVASC) 2.5 MG tablet TAKE 1 TABLET BY MOUTH EVERY DAY   aspirin 81 MG tablet Take 81 mg by mouth daily.   atorvastatin (LIPITOR) 20 MG tablet TAKE 1 TABLET BY MOUTH EVERY DAY   brimonidine (ALPHAGAN) 0.2 % ophthalmic solution Place 1 drop into both eyes 3 (three) times daily.   cetirizine (ZYRTEC) 10 MG tablet Take 1 tablet (10 mg total) by mouth daily as needed for allergies. For allergies   Cholecalciferol (VITAMIN D3) 5000 UNITS CAPS Take 5,000 Units by mouth daily. For supplement   citalopram (CELEXA) 40 MG tablet TAKE 1 TABLET BY MOUTH EVERY DAY   Cyanocobalamin (VITAMIN B-12 PO) Take 1 tablet by mouth daily.   docusate sodium (COLACE) 100 MG capsule Take 100 mg by mouth daily.   ferrous sulfate 325 (65 FE) MG tablet Take 325 mg by mouth daily with breakfast.   glipiZIDE (GLUCOTROL XL) 10 MG 24 hr tablet TAKE 1 TABLET (10 MG TOTAL) BY MOUTH DAILY WITH BREAKFAST.   insulin NPH Human (HUMULIN N) 100 UNIT/ML injection INJECT 40 UNITS IN THE MORNING AND 40 UNITS IN THE EVENING   insulin regular (HUMULIN R) 100 units/mL injection INJECT 0.05 MLS (5 UNITS  TOTAL) INTO THE SKIN AS DIRECTED. WHEN BLOOD SUGAR IS OVER 300. DX:E10.40   Insulin Syringes, Disposable, U-100 1 ML MISC Use to inject insulin Dx: Z61.0960   isosorbide mononitrate (IMDUR) 30 MG 24 hr tablet Take 1 tablet (30 mg total) by mouth daily.   losartan-hydrochlorothiazide (HYZAAR) 50-12.5 MG tablet Take 1 tablet by mouth daily.   memantine (NAMENDA) 5 MG tablet TAKE 1 TABLET (5 MG TOTAL) BY MOUTH DAILY.   nitroGLYCERIN (NITROSTAT) 0.4 MG SL tablet Place 0.4 mg under the tongue every 5 (five) minutes as needed for chest pain.   omeprazole (PRILOSEC) 20 MG capsule TAKE 1 CAPSULE BY MOUTH EVERY DAY   traZODone (DESYREL) 50 MG tablet TAKE 0.5 TABLETS BY MOUTH AT BEDTIME.   [DISCONTINUED] Continuous Blood Gluc Receiver (FREESTYLE LIBRE 2 READER) DEVI Use to test blood sugar three times daily.  Dx: E11.9   [DISCONTINUED] Continuous Blood Gluc Sensor (FREESTYLE LIBRE 2 SENSOR) MISC Use to test blood sugar three times daily. Dx: E11.9  No facility-administered encounter medications on file as of 04/13/2023.    Review of Systems  Constitutional:  Negative for appetite change, chills, fatigue, fever and unexpected weight change.  HENT:  Negative for congestion, dental problem, ear discharge, ear pain, facial swelling, hearing loss, nosebleeds, postnasal drip, rhinorrhea, sinus pressure, sinus pain, sneezing, sore throat, tinnitus and trouble swallowing.   Eyes:  Negative for pain, discharge, redness, itching and visual disturbance.  Respiratory:  Negative for cough, chest tightness, shortness of breath and wheezing.   Cardiovascular:  Negative for chest pain, palpitations and leg swelling.  Gastrointestinal:  Negative for abdominal distention, abdominal pain, blood in stool, constipation, diarrhea, nausea and vomiting.  Endocrine: Negative for cold intolerance, heat intolerance, polydipsia, polyphagia and polyuria.  Genitourinary:  Negative for difficulty urinating, dysuria, flank pain,  frequency and urgency.  Musculoskeletal:  Negative for arthralgias, back pain, gait problem, joint swelling, myalgias, neck pain and neck stiffness.  Skin:  Negative for color change, pallor, rash and wound.  Neurological:  Positive for dizziness. Negative for syncope, speech difficulty, weakness, light-headedness, numbness and headaches.  Hematological:  Does not bruise/bleed easily.  Psychiatric/Behavioral:  Negative for agitation, behavioral problems, confusion, hallucinations and sleep disturbance. The patient is not nervous/anxious.     Immunization History  Administered Date(s) Administered   DTaP 11/12/2011   Fluad Quad(high Dose 65+) 02/09/2019, 02/08/2020, 01/29/2021, 02/23/2022   Influenza Split 02/13/2009, 02/28/2014   Influenza, High Dose Seasonal PF 03/17/2017, 02/10/2018   Influenza,inj,Quad PF,6+ Mos 05/01/2015, 03/04/2016   Influenza-Unspecified 02/22/2013   Pneumococcal Conjugate-13 06/07/2013   Pneumococcal Polysaccharide-23 11/04/2016   Tdap 11/12/2011   Zoster Recombinant(Shingrix) 12/17/2017, 06/02/2018   Pertinent  Health Maintenance Due  Topic Date Due   OPHTHALMOLOGY EXAM  06/09/2022   INFLUENZA VACCINE  12/31/2022   HEMOGLOBIN A1C  07/30/2023   FOOT EXAM  01/29/2024   DEXA SCAN  Completed      12/04/2021    8:06 AM 02/02/2022    6:01 PM 04/02/2022   10:28 AM 01/29/2023    9:49 AM 04/13/2023   12:08 PM  Fall Risk  Falls in the past year? 1  1 0 0  Was there an injury with Fall? 0  -- 0 0  Was there an injury with Fall? - Comments   Knot over her eye, swollen knee, was tx for concussion.    Fall Risk Category Calculator 1   0 0  Fall Risk Category (Retired) Low      (RETIRED) Patient Fall Risk Level Low fall risk Low fall risk Moderate fall risk    Patient at Risk for Falls Due to No Fall Risks  Impaired balance/gait;Impaired mobility  History of fall(s)  Fall risk Follow up Falls evaluation completed  Falls evaluation completed  Falls evaluation completed    Functional Status Survey:    Vitals:   04/13/23 1209 04/13/23 1219  BP: 122/78   Pulse: 63   Resp: 18 18  Temp: (!) 97.4 F (36.3 C) 98.6 F (37 C)  SpO2: 96% 93%  Weight: 171 lb (77.6 kg) 171 lb (77.6 kg)  Height: 5' (1.524 m) 5' (1.524 m)   Body mass index is 33.4 kg/m. Physical Exam Vitals reviewed.  Constitutional:      General: She is not in acute distress.    Appearance: Normal appearance. She is obese. She is not ill-appearing or diaphoretic.  HENT:     Head: Normocephalic.     Right Ear: Tympanic membrane, ear canal and external ear normal.  There is no impacted cerumen.     Left Ear: Tympanic membrane, ear canal and external ear normal. There is no impacted cerumen.     Nose: Nose normal. No congestion or rhinorrhea.     Mouth/Throat:     Mouth: Mucous membranes are moist.     Pharynx: Oropharynx is clear. No oropharyngeal exudate or posterior oropharyngeal erythema.  Eyes:     General: No scleral icterus.       Right eye: No discharge.        Left eye: No discharge.     Extraocular Movements: Extraocular movements intact.     Conjunctiva/sclera: Conjunctivae normal.     Pupils: Pupils are equal, round, and reactive to light.     Comments: Corrective lens in place   Neck:     Vascular: No carotid bruit.  Cardiovascular:     Rate and Rhythm: Normal rate and regular rhythm.     Pulses: Normal pulses.     Heart sounds: Normal heart sounds. No murmur heard.    No friction rub. No gallop.  Pulmonary:     Effort: Pulmonary effort is normal. No respiratory distress.     Breath sounds: Normal breath sounds. No wheezing, rhonchi or rales.  Chest:     Chest wall: No tenderness.  Abdominal:     General: Bowel sounds are normal. There is no distension.     Palpations: Abdomen is soft. There is no mass.     Tenderness: There is no abdominal tenderness. There is no right CVA tenderness, left CVA tenderness, guarding or rebound.  Musculoskeletal:        General: No  swelling or tenderness. Normal range of motion.     Cervical back: Normal range of motion. No rigidity or tenderness.     Right lower leg: No edema.     Left lower leg: No edema.  Lymphadenopathy:     Cervical: No cervical adenopathy.  Skin:    General: Skin is warm and dry.     Coloration: Skin is not pale.     Findings: No bruising, erythema, lesion or rash.  Neurological:     Mental Status: She is alert and oriented to person, place, and time.     Cranial Nerves: No cranial nerve deficit.     Sensory: No sensory deficit.     Motor: No weakness.     Coordination: Coordination normal.     Gait: Gait abnormal.  Psychiatric:        Mood and Affect: Mood normal.        Speech: Speech normal.        Behavior: Behavior normal.     Labs reviewed: Recent Labs    01/27/23 0944  NA 141  K 4.6  CL 103  CO2 31  GLUCOSE 114  BUN 15  CREATININE 0.75  CALCIUM 9.3   Recent Labs    01/27/23 0944  AST 14  ALT 11  BILITOT 0.6  PROT 5.6*   Recent Labs    01/27/23 0944  WBC 6.5  NEUTROABS 3,640  HGB 12.4  HCT 38.6  MCV 91.7  PLT 311   Lab Results  Component Value Date   TSH 3.68 11/04/2016   Lab Results  Component Value Date   HGBA1C 7.6 (H) 01/27/2023   Lab Results  Component Value Date   CHOL 143 01/27/2023   HDL 56 01/27/2023   LDLCALC 67 01/27/2023   TRIG 114 01/27/2023   CHOLHDL 2.6 01/27/2023  Significant Diagnostic Results in last 30 days:  No results found.  Assessment/Plan  Dizziness B/p stable no orthostatic hypotension noted. - Neuro exam intact  - Encouraged to increase water intake - consider ENT evaluation possible related to inner ear  - CBC with Differential/Platelet - COMPLETE METABOLIC PANEL WITH GFR - EKG 16-[id number] Sinus Rhythm  - Ambulatory referral to ENT - meclizine (ANTIVERT) 12.5 MG tablet; Take 1 tablet (12.5 mg total) by mouth 3 (three) times daily as needed for dizziness.  Dispense: 30 tablet; Refill: 0  2. Type 2  diabetes mellitus with diabetic neuropathy, with long-term current use of insulin (HCC) Lab Results  Component Value Date   HGBA1C 7.6 (H) 01/27/2023  CBG runs in the 100's -200' s with few readings in the 300's.Had  x 3 episode low readings. - continue on Regular Insulin,NPH and Glipizide.Advised to notify provider if having low blood sugars.consider reduction of Glipizide. - will Obtain new Freestyle Libre then adjust meds if CBG still low.  - dietary modification advised   Family/ staff Communication: Reviewed plan of care with patient verbalized understanding   Labs/tests ordered:  - CBC with Differential/Platelet - COMPLETE METABOLIC PANEL WITH GFR - EKG [id number]  Next Appointment: Return if symptoms worsen or fail to improve.   Caesar Bookman, NP

## 2023-04-14 LAB — CBC WITH DIFFERENTIAL/PLATELET
Absolute Lymphocytes: 2063 {cells}/uL (ref 850–3900)
Absolute Monocytes: 400 {cells}/uL (ref 200–950)
Basophils Absolute: 69 {cells}/uL (ref 0–200)
Basophils Relative: 1 %
Eosinophils Absolute: 228 {cells}/uL (ref 15–500)
Eosinophils Relative: 3.3 %
HCT: 42.3 % (ref 35.0–45.0)
Hemoglobin: 13.4 g/dL (ref 11.7–15.5)
MCH: 29.1 pg (ref 27.0–33.0)
MCHC: 31.7 g/dL — ABNORMAL LOW (ref 32.0–36.0)
MCV: 91.8 fL (ref 80.0–100.0)
MPV: 11.4 fL (ref 7.5–12.5)
Monocytes Relative: 5.8 %
Neutro Abs: 4140 {cells}/uL (ref 1500–7800)
Neutrophils Relative %: 60 %
Platelets: 321 10*3/uL (ref 140–400)
RBC: 4.61 10*6/uL (ref 3.80–5.10)
RDW: 11.9 % (ref 11.0–15.0)
Total Lymphocyte: 29.9 %
WBC: 6.9 10*3/uL (ref 3.8–10.8)

## 2023-04-14 LAB — COMPLETE METABOLIC PANEL WITH GFR
AG Ratio: 1.4 (calc) (ref 1.0–2.5)
ALT: 10 U/L (ref 6–29)
AST: 14 U/L (ref 10–35)
Albumin: 3.4 g/dL — ABNORMAL LOW (ref 3.6–5.1)
Alkaline phosphatase (APISO): 93 U/L (ref 37–153)
BUN: 12 mg/dL (ref 7–25)
CO2: 26 mmol/L (ref 20–32)
Calcium: 9 mg/dL (ref 8.6–10.4)
Chloride: 107 mmol/L (ref 98–110)
Creat: 0.69 mg/dL (ref 0.60–0.95)
Globulin: 2.4 g/dL (ref 1.9–3.7)
Glucose, Bld: 155 mg/dL — ABNORMAL HIGH (ref 65–99)
Potassium: 3.7 mmol/L (ref 3.5–5.3)
Sodium: 142 mmol/L (ref 135–146)
Total Bilirubin: 0.5 mg/dL (ref 0.2–1.2)
Total Protein: 5.8 g/dL — ABNORMAL LOW (ref 6.1–8.1)
eGFR: 86 mL/min/{1.73_m2} (ref >=60)

## 2023-04-16 ENCOUNTER — Ambulatory Visit: Payer: Medicare Other | Admitting: Cardiovascular Disease

## 2023-04-22 ENCOUNTER — Telehealth: Payer: Self-pay | Admitting: Adult Health

## 2023-04-22 NOTE — Telephone Encounter (Signed)
Corinne from [employer] called requesting last 2 office visit notes and med list so that patient could obtain FreeStyle Libre. Faxed over requested information [phone]).

## 2023-05-04 ENCOUNTER — Encounter (INDEPENDENT_AMBULATORY_CARE_PROVIDER_SITE_OTHER): Payer: Self-pay | Admitting: Otolaryngology

## 2023-05-20 DIAGNOSIS — Z7982 Long term (current) use of aspirin: Secondary | ICD-10-CM | POA: Diagnosis not present

## 2023-05-20 DIAGNOSIS — K219 Gastro-esophageal reflux disease without esophagitis: Secondary | ICD-10-CM | POA: Diagnosis not present

## 2023-05-20 DIAGNOSIS — Z7984 Long term (current) use of oral hypoglycemic drugs: Secondary | ICD-10-CM | POA: Diagnosis not present

## 2023-05-20 DIAGNOSIS — J45909 Unspecified asthma, uncomplicated: Secondary | ICD-10-CM | POA: Diagnosis not present

## 2023-05-20 DIAGNOSIS — Z79899 Other long term (current) drug therapy: Secondary | ICD-10-CM | POA: Diagnosis not present

## 2023-05-20 DIAGNOSIS — I10 Essential (primary) hypertension: Secondary | ICD-10-CM | POA: Diagnosis not present

## 2023-05-20 DIAGNOSIS — R440 Auditory hallucinations: Secondary | ICD-10-CM | POA: Diagnosis not present

## 2023-05-20 DIAGNOSIS — R443 Hallucinations, unspecified: Secondary | ICD-10-CM | POA: Diagnosis not present

## 2023-05-20 DIAGNOSIS — E119 Type 2 diabetes mellitus without complications: Secondary | ICD-10-CM | POA: Diagnosis not present

## 2023-05-20 DIAGNOSIS — R410 Disorientation, unspecified: Secondary | ICD-10-CM | POA: Diagnosis not present

## 2023-05-20 DIAGNOSIS — Z794 Long term (current) use of insulin: Secondary | ICD-10-CM | POA: Diagnosis not present

## 2023-05-20 DIAGNOSIS — R42 Dizziness and giddiness: Secondary | ICD-10-CM | POA: Diagnosis not present

## 2023-05-20 DIAGNOSIS — Z7401 Bed confinement status: Secondary | ICD-10-CM | POA: Diagnosis not present

## 2023-05-20 DIAGNOSIS — R404 Transient alteration of awareness: Secondary | ICD-10-CM | POA: Diagnosis not present

## 2023-05-21 ENCOUNTER — Telehealth: Payer: Self-pay

## 2023-05-21 DIAGNOSIS — E1140 Type 2 diabetes mellitus with diabetic neuropathy, unspecified: Secondary | ICD-10-CM

## 2023-05-21 NOTE — Telephone Encounter (Signed)
Patient called and states that she needs to check her blood sugars. She requested that device be sent into pharmacy. I called patient back but no answer and voicemail box is full so you cannot leave voicemail.

## 2023-05-22 ENCOUNTER — Other Ambulatory Visit: Payer: Self-pay | Admitting: Family Medicine

## 2023-05-24 ENCOUNTER — Other Ambulatory Visit: Payer: Self-pay | Admitting: Adult Health

## 2023-05-24 DIAGNOSIS — E1140 Type 2 diabetes mellitus with diabetic neuropathy, unspecified: Secondary | ICD-10-CM

## 2023-05-24 MED ORDER — FREESTYLE LIBRE 3 SENSOR MISC: 1.0000 | 12 refills | Status: DC

## 2023-05-24 NOTE — Telephone Encounter (Signed)
Patient called and stated that she left a message on Friday requesting her Tempie Hoist #3 to be called to the pharmacy. Stated that she is out of the sensors to check her blood sugar.   Rx sent to pharmacy as requested.

## 2023-05-24 NOTE — Telephone Encounter (Signed)
Patient has request refill on medication and it has warnings. Medication pend and sent to PCP Medina-Vargas, Margit Banda, NP

## 2023-05-27 NOTE — Telephone Encounter (Signed)
  Pharmacy comment: Alternative Requested:MEDICARE SAYS "MISSING SUPPORTING DOCUMENTATION" FROM PRESCRIBER.

## 2023-05-28 ENCOUNTER — Other Ambulatory Visit: Payer: Self-pay | Admitting: Cardiovascular Disease

## 2023-05-28 ENCOUNTER — Telehealth: Payer: Self-pay | Admitting: *Deleted

## 2023-05-28 ENCOUNTER — Other Ambulatory Visit: Payer: Self-pay | Admitting: Adult Health

## 2023-05-28 ENCOUNTER — Encounter: Payer: Medicare Other | Admitting: Family

## 2023-05-28 ENCOUNTER — Telehealth: Payer: Self-pay

## 2023-05-28 DIAGNOSIS — G3184 Mild cognitive impairment, so stated: Secondary | ICD-10-CM

## 2023-05-28 DIAGNOSIS — Z794 Long term (current) use of insulin: Secondary | ICD-10-CM

## 2023-05-28 DIAGNOSIS — F5101 Primary insomnia: Secondary | ICD-10-CM

## 2023-05-28 MED ORDER — FREESTYLE LIBRE 3 SENSOR MISC: 12 refills | Status: DC

## 2023-05-28 NOTE — Telephone Encounter (Signed)
Noted  

## 2023-05-28 NOTE — Telephone Encounter (Signed)
Patient called and Left message on Clinical Intake Voicemail stating that she needed a Rx for her sensors called to the pharmacy and the pharmacy did not receive.   Resent Rx and tried calling patient back, LMOM to return call.

## 2023-05-28 NOTE — Telephone Encounter (Signed)
High Risk Warning Populated when attempting to refill, I will send to Provider for further review 

## 2023-05-28 NOTE — Telephone Encounter (Signed)
Called patient 3 times for Mychart visit and no answer left message for patient to return call to reschedule appointment.

## 2024-05-01 ENCOUNTER — Ambulatory Visit: Payer: Self-pay | Admitting: *Deleted

## 2024-05-01 NOTE — Telephone Encounter (Signed)
   FYI Only or Action Required?: FYI only for provider: appointment scheduled on 05/04/24.  Patient was last seen in primary care on 05/28/2023 by [name], Roxan BROCKS, NP.  Called Nurse Triage reporting Dizziness.  Symptoms began several months ago.  Interventions attempted: Nothing.  Symptoms are: gradually worsening.  Triage Disposition: See Physician Within 24 Hours  Patient/caregiver understands and will follow disposition?: Yes    Offered appt today or tomorrow and patient declined due to transportation issues. Scheduled appt for 05/04/24.            Copied from CRM #[id number]. Topic: Clinical - Red Word Triage >> May 01, 2024 11:18 AM Miquel SAILOR wrote: Red Word that prompted transfer to Nurse Triage: Dizzyness for 3 months/stays same >> May 01, 2024 11:20 AM Miquel SAILOR wrote: Dizziness for 3 months/stays same   Reason for Disposition  Taking a medicine that could cause dizziness (e.g., blood pressure medications, diuretics)  Answer Assessment - Initial Assessment Questions Appt scheduled for 05/04/24 per patient request due to transportation. Offered appt today or tomorrow and patient declined. Denies chest pain no difficulty breathing no fever. No headaches no blurred vision no weakness on either side of body. Eating and drinking plenty. Does not check BP or blood sugar. No issues with taking medications. Recommended if sx worsen call back or go to ED.      1. DESCRIPTION: Describe your dizziness.     lightheaded 2. LIGHTHEADED: Do you feel lightheaded? (e.g., somewhat faint, woozy, weak upon standing)    No  3. VERTIGO: Do you feel like either you or the room is spinning or tilting? (i.e., vertigo)     na 4. SEVERITY: How bad is it?  Do you feel like you are going to faint? Can you stand and walk?     Mild now can walk 5. ONSET:  When did the dizziness begin?     Approx 3 months ago comes and goes  6. AGGRAVATING FACTORS: Does anything make it worse?  (e.g., standing, change in head position)     Change in head position  7. HEART RATE: Can you tell me your heart rate? How many beats in 15 seconds?  (Note: Not all patients can do this.)       na 8. CAUSE: What do you think is causing the dizziness? (e.g., decreased fluids or food, diarrhea, emotional distress, heat exposure, new medicine, sudden standing, vomiting; unknown)     na 9. RECURRENT SYMPTOM: Have you had dizziness before? If Yes, ask: When was the last time? What happened that time?     Yes but always goes away  10. OTHER SYMPTOMS: Do you have any other symptoms? (e.g., fever, chest pain, vomiting, diarrhea, bleeding)       Dizziness lightheaded. Worse turning head. Numbness in bilateral feet but no where else. No headache no blurred vision.  11. PREGNANCY: Is there any chance you are pregnant? When was your last menstrual period?       na  Protocols used: Dizziness - Lightheadedness-A-AH

## 2024-05-04 ENCOUNTER — Encounter: Payer: Self-pay | Admitting: Adult Health

## 2024-05-04 ENCOUNTER — Ambulatory Visit: Admitting: Adult Health

## 2024-05-04 ENCOUNTER — Telehealth: Payer: Self-pay

## 2024-05-04 VITALS — BP 126/74 | HR 95 | Temp 97.9°F | Ht 60.0 in

## 2024-05-04 DIAGNOSIS — Z794 Long term (current) use of insulin: Secondary | ICD-10-CM | POA: Diagnosis not present

## 2024-05-04 DIAGNOSIS — E1140 Type 2 diabetes mellitus with diabetic neuropathy, unspecified: Secondary | ICD-10-CM

## 2024-05-04 DIAGNOSIS — D509 Iron deficiency anemia, unspecified: Secondary | ICD-10-CM

## 2024-05-04 DIAGNOSIS — I10 Essential (primary) hypertension: Secondary | ICD-10-CM

## 2024-05-04 DIAGNOSIS — F332 Major depressive disorder, recurrent severe without psychotic features: Secondary | ICD-10-CM | POA: Diagnosis not present

## 2024-05-04 DIAGNOSIS — I25119 Atherosclerotic heart disease of native coronary artery with unspecified angina pectoris: Secondary | ICD-10-CM | POA: Diagnosis not present

## 2024-05-04 DIAGNOSIS — F5101 Primary insomnia: Secondary | ICD-10-CM | POA: Diagnosis not present

## 2024-05-04 DIAGNOSIS — F03B0 Unspecified dementia, moderate, without behavioral disturbance, psychotic disturbance, mood disturbance, and anxiety: Secondary | ICD-10-CM

## 2024-05-04 DIAGNOSIS — E782 Mixed hyperlipidemia: Secondary | ICD-10-CM

## 2024-05-04 DIAGNOSIS — R42 Dizziness and giddiness: Secondary | ICD-10-CM | POA: Diagnosis not present

## 2024-05-04 DIAGNOSIS — Z23 Encounter for immunization: Secondary | ICD-10-CM | POA: Diagnosis not present

## 2024-05-04 DIAGNOSIS — K219 Gastro-esophageal reflux disease without esophagitis: Secondary | ICD-10-CM | POA: Diagnosis not present

## 2024-05-04 MED ORDER — INSULIN NPH (HUMAN) (ISOPHANE) 100 UNIT/ML ~~LOC~~ SUSP: 25.0000 [IU] | Freq: Two times a day (BID) | SUBCUTANEOUS | 3 refills | Status: DC

## 2024-05-04 MED ORDER — INSULIN NPH (HUMAN) (ISOPHANE) 100 UNIT/ML ~~LOC~~ SUSP: 25.0000 [IU] | Freq: Two times a day (BID) | SUBCUTANEOUS | 5 refills | Status: DC

## 2024-05-04 MED ORDER — FREESTYLE LIBRE 3 SENSOR MISC: 12 refills | Status: AC

## 2024-05-04 MED ORDER — TRAZODONE HCL 50 MG PO TABS: 25.0000 mg | ORAL_TABLET | Freq: Every day | ORAL | 1 refills | Status: AC

## 2024-05-04 MED ORDER — MEMANTINE HCL 5 MG PO TABS: 5.0000 mg | ORAL_TABLET | Freq: Two times a day (BID) | ORAL | 3 refills | Status: AC

## 2024-05-04 MED ORDER — OMEPRAZOLE 40 MG PO CPDR: 40.0000 mg | DELAYED_RELEASE_CAPSULE | Freq: Every day | ORAL | 3 refills | Status: AC

## 2024-05-04 MED ORDER — ISOSORBIDE MONONITRATE ER 30 MG PO TB24: 30.0000 mg | ORAL_TABLET | Freq: Every day | ORAL | 2 refills | Status: AC

## 2024-05-04 MED ORDER — MECLIZINE HCL 12.5 MG PO TABS: 12.5000 mg | ORAL_TABLET | Freq: Three times a day (TID) | ORAL | 1 refills | Status: AC | PRN

## 2024-05-04 NOTE — Patient Instructions (Signed)
   Follow up with ophthalmology and cardiology. Check medication supply and call [facility] if needing refills

## 2024-05-04 NOTE — Progress Notes (Signed)
 [facility] clinic  Provider:  Jereld Serum DNP  Code Status:  DNR  Goals of Care:     04/13/2023   12:09 PM  Advanced Directives  Does Patient Have a Medical Advance Directive? Yes  Type of Advance Directive Out of facility DNR (pink MOST or yellow form)  Does patient want to make changes to medical advance directive? No - Patient declined  Pre-existing out of facility DNR order (yellow form or pink MOST form) Yellow form placed in chart (order not valid for inpatient use)     Chief Complaint  Patient presents with   Dizziness    Ongoing for a few weeks. No nausea associated with the dizziness    Discussed the use of AI scribe software for clinical note transcription with the patient, who gave verbal consent to proceed.  HPI: Patient is a 83 y.o. female seen today for an acute visit for dizziness. She is accompanied by her daughter-in-law.  She has been experiencing dizziness for at least three months, which occurs when she turns her head, regardless of whether she is sitting or standing. She describes being 'dizzy all the time' and is currently experiencing dizziness. She has not been taking any medication for dizziness and wonders if it could be related to her long-term use of Celexa .  She is hard of hearing in both ears and is planning to visit an audiologist today. She has lost one of her hearing aids and has not been wearing them for a while.  She has a history of type 2 diabetes mellitus and reports numbness in her feet, which she attributes to neuropathy from diabetes. She takes 25 units of insulin  twice a day and uses regular insulin  as needed when her blood sugar exceeds 300. She does not currently monitor her blood sugar at home. Her last A1c was 7.6 in August 2024.  She has hypertension and takes amlodipine  2.5 mg daily. She also takes losartan  hydrochlorothiazide  50/12.5 mg daily.  She has mixed hyperlipidemia and takes atorvastatin  20 mg daily. Her diet is  reported as good, with a focus on vegetables, and she engages in seated exercises.  She has coronary artery disease and occasionally experiences chest pain over her heart, with the last episode occurring three weeks ago. She is unsure if she has nitroglycerin  at home. She takes Imdur  30 mg daily and aspirin  81 mg daily.  She has a history of depression and takes Celexa  40 mg daily. She reports not feeling depressed or anxious. She also takes trazodone  25 mg at bedtime for insomnia, averaging about five hours of sleep per night with a one-hour nap during the day.  Her toenails are described as thick and curled.  She experiences acid reflux and takes omeprazole  20 mg daily, supplementing with Tums as needed. She also takes glipizide  10 mg daily for diabetes management.  She takes iron once a day for anemia and Colace 100 mg daily for constipation.    Past Medical History:  Diagnosis Date   Abnormality of gait    Cardiomegaly    Complication of anesthesia    hard time waking up   Depressive disorder, not elsewhere classified    Diabetic retinopathy (HCC)    Extrinsic asthma, unspecified    Female stress incontinence    Gout, unspecified    High cholesterol    Lumbago    Memory loss    Migraine without aura, with intractable migraine, so stated, with status migrainosus    Obesity, unspecified  Palpitations    Reflux esophagitis    Type I (juvenile type) diabetes mellitus without mention of complication, uncontrolled    Unspecified essential hypertension    Unspecified hypothyroidism    Unspecified vitamin D  deficiency    Ventral hernia, unspecified, without mention of obstruction or gangrene     Past Surgical History:  Procedure Laterality Date   ABDOMINAL HYSTERECTOMY     APPENDECTOMY     BREAST SURGERY     EXPLORATORY LAPAROTOMY W/ BOWEL RESECTION  07/25/2004   PROCEDURE:  Laparoscopy, open laparotomy, resection of jejunojejunostomy   INCISIONAL HERNIA REPAIR  03/19/2006    PROCEDURE:  Open ventral hernia repair with mesh.   LAPAROSCOPIC GASTRIC BYPASS  07/22/2004   PROCEDURE:  Laparoscopic Roux-en-Y gastric bypass, antecolic, antegastric,   LAPAROSCOPIC INCISIONAL / UMBILICAL / VENTRAL HERNIA REPAIR  09/22/2005   PROCEDURE:  Laparoscopic ventral hernia repair with mesh.   LEFT HEART CATH AND CORONARY ANGIOGRAPHY N/A 11/20/2016   Procedure: Left Heart Cath and Coronary Angiography;  Surgeon: Wonda Sharper, MD;  Location: [facility] INVASIVE CV LAB;  Service: Cardiovascular;  Laterality: N/A;   LEFT HEART CATHETERIZATION WITH CORONARY ANGIOGRAM N/A 08/21/2014   Procedure: LEFT HEART CATHETERIZATION WITH CORONARY ANGIOGRAM;  Surgeon: Sharper Wonda, MD;  Location: [facility] CATH LAB;  Service: Cardiovascular;  Laterality: N/A;   stents     last time she said was 1 yr ago     Allergies  Allergen Reactions   Ciprofloxacin  Rash   Advair Diskus [Fluticasone -Salmeterol] Other (See Comments)    Throat closes   Albuterol      REACTION: closes throat   Alendronate Sodium Itching   Benadryl  [Diphenhydramine  Hcl] Itching   Benzonatate     REACTION: rash/hives   Cephalexin Nausea Only   Gabapentin Other (See Comments)    Loss of memory   Iohexol       Desc: rash and DIF BREATHING    Keflex [Cephalexin] Nausea And Vomiting   Morphine  Nausea And Vomiting   Other Other (See Comments)    Decongestants- keeps pt awake at night, increased heart rate   Propoxyphene Hcl     Doesn't recall   Reclast [Zoledronic Acid] Other (See Comments)    Chest pain   Avapro [Irbesartan] Rash   Codeine Nausea And Vomiting, Swelling and Rash   Tessalon Perles Rash   Tramadol Nausea Only and Rash    Outpatient Encounter Medications as of 05/04/2024  Medication Sig   amLODipine  (NORVASC ) 2.5 MG tablet TAKE 1 TABLET BY MOUTH EVERY DAY   aspirin  81 MG tablet Take 81 mg by mouth daily.   atorvastatin  (LIPITOR) 20 MG tablet TAKE 1 TABLET BY MOUTH EVERY DAY   brimonidine  (ALPHAGAN ) 0.2 % ophthalmic  solution Place 1 drop into both eyes 3 (three) times daily.   cetirizine  (ZYRTEC ) 10 MG tablet TAKE 1 TABLET (10 MG TOTAL) BY MOUTH DAILY AS NEEDED FOR ALLERGIES. FOR ALLERGIES   Cholecalciferol  (VITAMIN D3) 5000 UNITS CAPS Take 5,000 Units by mouth daily. For supplement   citalopram  (CELEXA ) 40 MG tablet TAKE 1 TABLET BY MOUTH EVERY DAY   Continuous Glucose Receiver (FREESTYLE LIBRE 3 READER) DEVI 1 Device by Does not apply route 3 (three) times daily. E11.40   Cyanocobalamin (VITAMIN B-12 PO) Take 1 tablet by mouth daily.   ferrous sulfate 325 (65 FE) MG tablet Take 325 mg by mouth daily with breakfast.   glipiZIDE  (GLUCOTROL  XL) 10 MG 24 hr tablet TAKE 1 TABLET (10 MG TOTAL) BY MOUTH DAILY WITH  BREAKFAST.   HUMULIN  R 100 UNIT/ML injection INJECT 0.05 MLS (5 UNITS TOTAL) INTO THE SKIN AS DIRECTED. WHEN BLOOD SUGAR IS OVER 300. DX:E10.40   Insulin  Syringes, Disposable, U-100 1 ML MISC Use to inject insulin  Dx: E11.3513   losartan -hydrochlorothiazide  (HYZAAR) 50-12.5 MG tablet TAKE 1 TABLET BY MOUTH EVERY DAY   nitroGLYCERIN  (NITROSTAT ) 0.4 MG SL tablet Place 0.4 mg under the tongue every 5 (five) minutes as needed for chest pain.   omeprazole  (PRILOSEC) 40 MG capsule Take 1 capsule (40 mg total) by mouth daily.   [DISCONTINUED] Continuous Glucose Sensor (FREESTYLE LIBRE 3 SENSOR) MISC Place 1 sensor on the skin every 14 days. Use to check glucose Dx: E11.40   [DISCONTINUED] insulin  NPH Human (HUMULIN  N) 100 UNIT/ML injection INJECT 40 UNITS IN THE MORNING AND 40 UNITS IN THE EVENING   [DISCONTINUED] isosorbide  mononitrate (IMDUR ) 30 MG 24 hr tablet TAKE 1 TABLET BY MOUTH EVERY DAY   [DISCONTINUED] meclizine  (ANTIVERT ) 12.5 MG tablet Take 1 tablet (12.5 mg total) by mouth 3 (three) times daily as needed for dizziness.   [DISCONTINUED] memantine  (NAMENDA ) 5 MG tablet TAKE 1 TABLET (5 MG TOTAL) BY MOUTH DAILY.   [DISCONTINUED] omeprazole  (PRILOSEC) 20 MG capsule TAKE 1 CAPSULE BY MOUTH EVERY DAY    Continuous Glucose Sensor (FREESTYLE LIBRE 3 SENSOR) MISC Place 1 sensor on the skin every 14 days. Use to check glucose Dx: E11.40   docusate sodium  (COLACE) 100 MG capsule Take 100 mg by mouth daily. (Patient not taking: Reported on 05/04/2024)   insulin  NPH Human (HUMULIN  N) 100 UNIT/ML injection Inject 0.25 mLs (25 Units total) into the skin 2 (two) times daily before a meal. INJECT 40 UNITS IN THE MORNING AND 40 UNITS IN THE EVENING   isosorbide  mononitrate (IMDUR ) 30 MG 24 hr tablet Take 1 tablet (30 mg total) by mouth daily.   meclizine  (ANTIVERT ) 12.5 MG tablet Take 1 tablet (12.5 mg total) by mouth 3 (three) times daily as needed for dizziness.   memantine  (NAMENDA ) 5 MG tablet Take 1 tablet (5 mg total) by mouth 2 (two) times daily.   traZODone  (DESYREL ) 50 MG tablet Take 0.5 tablets (25 mg total) by mouth at bedtime.   [DISCONTINUED] traZODone  (DESYREL ) 50 MG tablet TAKE 1/2 TABLET BY MOUTH AT BEDTIME (Patient not taking: Reported on 05/04/2024)   No facility-administered encounter medications on file as of 05/04/2024.    Review of Systems:  Review of Systems  Constitutional:  Negative for appetite change, chills, fatigue and fever.  HENT:  Positive for hearing loss. Negative for congestion, rhinorrhea and sore throat.   Eyes: Negative.   Respiratory:  Negative for cough, shortness of breath and wheezing.   Cardiovascular:  Negative for chest pain, palpitations and leg swelling.  Gastrointestinal:  Negative for abdominal pain, constipation, diarrhea, nausea and vomiting.  Genitourinary:  Negative for dysuria.  Musculoskeletal:  Negative for arthralgias, back pain and myalgias.  Skin:  Negative for color change, rash and wound.  Neurological:  Positive for dizziness. Negative for weakness and headaches.  Psychiatric/Behavioral:  Negative for behavioral problems. The patient is not nervous/anxious.     Health Maintenance  Topic Date Due   DTaP/Tdap/Td (3 - Td or Tdap) 11/11/2021    Medicare Annual Wellness (AWV)  12/05/2022   OPHTHALMOLOGY EXAM  05/12/2023   HEMOGLOBIN A1C  07/30/2023   Influenza Vaccine  12/31/2023   Diabetic kidney evaluation - Urine ACR  01/29/2024   Diabetic kidney evaluation - eGFR measurement  04/12/2024   FOOT EXAM  05/04/2025   Pneumococcal Vaccine: 50+ Years  Completed   Bone Density Scan  Completed   Zoster Vaccines- Shingrix  Completed   Meningococcal B Vaccine  Aged Out   COVID-19 Vaccine  Discontinued    Physical Exam: Vitals:   05/04/24 0943  BP: 126/74  Pulse: 95  Temp: 97.9 F (36.6 C)  TempSrc: Temporal  SpO2: 96%  Height: 5' (1.524 m)   Body mass index is 33.4 kg/m. Physical Exam Constitutional:      General: She is not in acute distress.    Appearance: She is obese.  HENT:     Head: Normocephalic and atraumatic.     Nose: Nose normal.     Mouth/Throat:     Mouth: Mucous membranes are moist.  Eyes:     Conjunctiva/sclera: Conjunctivae normal.  Cardiovascular:     Rate and Rhythm: Normal rate and regular rhythm.  Pulmonary:     Effort: Pulmonary effort is normal.     Breath sounds: Normal breath sounds.  Abdominal:     General: Bowel sounds are normal.     Palpations: Abdomen is soft.     Comments: Has abdominal hernia  Musculoskeletal:        General: Normal range of motion.     Cervical back: Normal range of motion.  Skin:    General: Skin is warm and dry.  Neurological:     General: No focal deficit present.     Mental Status: She is alert and oriented to person, place, and time.  Psychiatric:        Mood and Affect: Mood normal.        Behavior: Behavior normal.        Thought Content: Thought content normal.        Judgment: Judgment normal.     Labs reviewed: Basic Metabolic Panel: No results for input(s): NA, K, CL, CO2, GLUCOSE, BUN, CREATININE, CALCIUM , MG, PHOS, TSH in the last 8760 hours. Liver Function Tests: No results for input(s): AST, ALT, ALKPHOS,  BILITOT, PROT, ALBUMIN  in the last 8760 hours. No results for input(s): LIPASE, AMYLASE in the last 8760 hours. No results for input(s): AMMONIA in the last 8760 hours. CBC: No results for input(s): WBC, NEUTROABS, HGB, HCT, MCV, PLT in the last 8760 hours. Lipid Panel: No results for input(s): CHOL, HDL, LDLCALC, TRIG, CHOLHDL, LDLDIRECT in the last 8760 hours. Lab Results  Component Value Date   HGBA1C 7.6 (H) 01/27/2023    Procedures since last visit: No results found.  Assessment/Plan  1. Dizziness (Primary) -  Chronic dizziness exacerbated by head movements and positional changes. Possible lack of hearing aid use contributing. - Continue meclizine  12.5 mg three times a day as needed. - Encouraged follow-up with audiologist for hearing aid fitting. - meclizine  (ANTIVERT ) 12.5 MG tablet; Take 1 tablet (12.5 mg total) by mouth 3 (three) times daily as needed for dizziness.  Dispense: 30 tablet; Refill: 1  2. Essential hypertension, benign -  Blood pressure 126/74 mmHg. - Continue amlodipine  2.5 mg daily. - Continue losartan /hydrochlorothiazide  50/12.5 mg daily. - Comprehensive metabolic panel  3. Mixed hyperlipidemia Lab Results  Component Value Date   CHOL 143 01/27/2023   HDL 56 01/27/2023   LDLCALC 67 01/27/2023   TRIG 114 01/27/2023   CHOLHDL 2.6 01/27/2023    -  Managed with atorvastatin . - Continue atorvastatin  20 mg daily. - Lipid Panel  4. Type 2 diabetes mellitus with diabetic neuropathy, with  long-term current use of insulin  (HCC) -  Inconsistent blood sugar monitoring due to lack of Libre device. Last A1c 7.6% in August 2024. Neuropathy symptoms include numbness in feet. - Ordered A1c test. - Prescribed Libre device for continuous glucose monitoring. - Continue insulin  regimen: 25 units of Humulin  N in the morning and evening, and 5 units of regular insulin  if blood sugar exceeds 300 mg/dL. - Continue glipizide  10 mg  daily. - Continuous Glucose Sensor (FREESTYLE LIBRE 3 SENSOR) MISC; Place 1 sensor on the skin every 14 days. Use to check glucose Dx: E11.40  Dispense: 3 each; Refill: 12 - Hemoglobin A1c - Microalbumin/Creatinine Ratio, Urine - Ambulatory referral to Podiatry - insulin  NPH Human (HUMULIN  N) 100 UNIT/ML injection; Inject 0.25 mLs (25 Units total) into the skin 2 (two) times daily before a meal.  Dispense: 30 mL; Refill: 5  5. Severe episode of recurrent major depressive disorder, without psychotic features (HCC) -  PHQ-9 score 0 -  continue Celexa  40 mg daily -  declines lowering dosage  6. Primary insomnia -  Reports averaging 5 hours of sleep per night with additional daytime napping. - Prescribed trazodone  25 mg at bedtime. - Encouraged regular exercise and limiting fluid intake after 7 PM. - traZODone  (DESYREL ) 50 MG tablet; Take 0.5 tablets (25 mg total) by mouth at bedtime.  Dispense: 45 tablet; Refill: 1  7. Coronary artery disease involving native coronary artery of native heart with angina pectoris -  Intermittent chest pain over the past three weeks. No recent nitroglycerin  use. Follow-up with cardiology necessary. - Ensure follow-up with cardiologist, Dr. Wonda. - continue Imdur  30 mg daily. -  continue NTG PRN -  continue ASA 81 mg daily -   continue Atorvastatin  20 mg daily - Monitor blood pressure and heart rate daily and record. - isosorbide  mononitrate (IMDUR ) 30 MG 24 hr tablet; Take 1 tablet (30 mg total) by mouth daily.  Dispense: 90 tablet; Refill: 2  8. Moderate dementia without behavioral disturbance, psychotic disturbance, mood disturbance, or anxiety, unspecified dementia type (HCC) - stable -  will increase Memantine  5 mg BID - memantine  (NAMENDA ) 5 MG tablet; Take 1 tablet (5 mg total) by mouth 2 (two) times daily.  Dispense: 180 tablet; Refill: 3  9. Gastroesophageal reflux disease without esophagitis -  managed with omeprazole . Additional Tums use  suggests possible need for increased dosage. - Increased omeprazole  to 40 mg daily. - omeprazole  (PRILOSEC) 40 MG capsule; Take 1 capsule (40 mg total) by mouth daily.  Dispense: 90 capsule; Refill: 3  10. Iron deficiency anemia, unspecified iron deficiency anemia type -  currently taking FeSO4 325 mg daily - CBC with Differential/Platelets  11. Need for influenza vaccination - Flu vaccine trivalent PF, 6mos and older(Flulaval,Afluria,Fluarix,Fluzone)   General Health Maintenance Diet is good with adequate vegetable intake. Exercise includes seated arm exercises. - Encouraged at least 150 minutes of exercise per week. - Encouraged continuation of healthy diet.  Labs/tests ordered:  urine microalbumin creatinine ratio, lipid panel, A1C, CBC, CMP   Return in about 3 months (around 08/02/2024).  Maurissa Ambrose Medina-Vargas, NP

## 2024-05-04 NOTE — Telephone Encounter (Signed)
 Copied from CRM #[id number]. Topic: General - Other >> May 04, 2024 11:40 AM Debby BROCKS wrote: Reason for CRM: Alfonso from [facility] called in to check on the status of the fax they sent for the patients continous glucose monitor sent on 05/02/2024. Callback: [phone] ext [phone] Fax: [phone]

## 2024-05-05 ENCOUNTER — Ambulatory Visit: Payer: Self-pay | Admitting: Adult Health

## 2024-05-05 ENCOUNTER — Other Ambulatory Visit: Payer: Self-pay | Admitting: Adult Health

## 2024-05-05 DIAGNOSIS — E1140 Type 2 diabetes mellitus with diabetic neuropathy, unspecified: Secondary | ICD-10-CM

## 2024-05-05 LAB — COMPREHENSIVE METABOLIC PANEL WITH GFR
AG Ratio: 1.2 (calc) (ref 1.0–2.5)
ALT: 9 U/L (ref 6–29)
AST: 13 U/L (ref 10–35)
Albumin: 3.1 g/dL — ABNORMAL LOW (ref 3.6–5.1)
Alkaline phosphatase (APISO): 77 U/L (ref 37–153)
BUN: 11 mg/dL (ref 7–25)
CO2: 30 mmol/L (ref 20–32)
Calcium: 8.6 mg/dL (ref 8.6–10.4)
Chloride: 103 mmol/L (ref 98–110)
Creat: 0.83 mg/dL (ref 0.60–0.95)
Globulin: 2.6 g/dL (ref 1.9–3.7)
Glucose, Bld: 190 mg/dL — ABNORMAL HIGH (ref 65–139)
Potassium: 3.9 mmol/L (ref 3.5–5.3)
Sodium: 141 mmol/L (ref 135–146)
Total Bilirubin: 0.6 mg/dL (ref 0.2–1.2)
Total Protein: 5.7 g/dL — ABNORMAL LOW (ref 6.1–8.1)
eGFR: 69 mL/min/1.73m2 (ref >=60)

## 2024-05-05 LAB — LIPID PANEL
Cholesterol: 150 mg/dL (ref <200)
HDL: 54 mg/dL (ref >=50)
LDL Cholesterol (Calc): 76 mg/dL
Non-HDL Cholesterol (Calc): 96 mg/dL (ref <130)
Total CHOL/HDL Ratio: 2.8 (calc) (ref <5.0)
Triglycerides: 114 mg/dL (ref <150)

## 2024-05-05 LAB — CBC WITH DIFFERENTIAL/PLATELET
Absolute Lymphocytes: 2310 {cells}/uL (ref 850–3900)
Absolute Monocytes: 400 {cells}/uL (ref 200–950)
Basophils Absolute: 77 {cells}/uL (ref 0–200)
Basophils Relative: 1 %
Eosinophils Absolute: 131 {cells}/uL (ref 15–500)
Eosinophils Relative: 1.7 %
HCT: 40.4 % (ref 35.9–46.0)
Hemoglobin: 13.2 g/dL (ref 11.7–15.5)
MCH: 29.6 pg (ref 27.0–33.0)
MCHC: 32.7 g/dL (ref 31.6–35.4)
MCV: 90.6 fL (ref 81.4–101.7)
MPV: 12.3 fL (ref 7.5–12.5)
Monocytes Relative: 5.2 %
Neutro Abs: 4782 {cells}/uL (ref 1500–7800)
Neutrophils Relative %: 62.1 %
Platelets: 305 Thousand/uL (ref 140–400)
RBC: 4.46 Million/uL (ref 3.80–5.10)
RDW: 12.5 % (ref 11.0–15.0)
Total Lymphocyte: 30 %
WBC: 7.7 Thousand/uL (ref 3.8–10.8)

## 2024-05-05 LAB — HEMOGLOBIN A1C
Hgb A1c MFr Bld: 8.5 % — ABNORMAL HIGH (ref <5.7)
Mean Plasma Glucose: 197 mg/dL
eAG (mmol/L): 10.9 mmol/L

## 2024-05-05 MED ORDER — INSULIN NPH (HUMAN) (ISOPHANE) 100 UNIT/ML ~~LOC~~ SUSP: 28.0000 [IU] | Freq: Two times a day (BID) | SUBCUTANEOUS | Status: AC

## 2024-05-05 NOTE — Progress Notes (Signed)
-    A1C 8.5, up from 7.6, increase NPH Humulin  from 25 units Twice a day to 28 units twice a day. -  protein is low, pls take Boost for diabetics once a day to help increase protein -  lipid panel normal -  no anemia

## 2024-05-06 LAB — MICROALBUMIN / CREATININE URINE RATIO
Creatinine, Urine: 137 mg/dL (ref 20–275)
Microalb Creat Ratio: 682 mg/g{creat} — ABNORMAL HIGH (ref <30)
Microalb, Ur: 93.4 mg/dL

## 2024-05-08 NOTE — Progress Notes (Signed)
-   urine microalbumin creatinine ratio is elevated, will need to have a better control of diabetes  for this to go down

## 2024-05-08 NOTE — Telephone Encounter (Signed)
 Returned call to Alfonso and they received the fax on 12/5. Closing encounter

## 2024-05-19 ENCOUNTER — Ambulatory Visit: Admitting: Podiatry

## 2024-08-03 ENCOUNTER — Ambulatory Visit: Admitting: Adult Health
